# Patient Record
Sex: Male | Born: 1955 | Race: White | Hispanic: No | Marital: Married | Smoking: Never smoker
Health system: Southern US, Community
[De-identification: ages and names within clinical notes are randomized; demographics above are authoritative.]

## PROBLEM LIST (undated history)

## (undated) DIAGNOSIS — M5410 Radiculopathy, site unspecified: Secondary | ICD-10-CM

## (undated) DIAGNOSIS — G4730 Sleep apnea, unspecified: Secondary | ICD-10-CM

## (undated) DIAGNOSIS — M47816 Spondylosis without myelopathy or radiculopathy, lumbar region: Secondary | ICD-10-CM

## (undated) DIAGNOSIS — I10 Essential (primary) hypertension: Secondary | ICD-10-CM

## (undated) DIAGNOSIS — M48061 Spinal stenosis, lumbar region without neurogenic claudication: Secondary | ICD-10-CM

## (undated) DIAGNOSIS — R809 Proteinuria, unspecified: Secondary | ICD-10-CM

## (undated) DIAGNOSIS — T4145XA Adverse effect of unspecified anesthetic, initial encounter: Secondary | ICD-10-CM

## (undated) DIAGNOSIS — T8859XA Other complications of anesthesia, initial encounter: Secondary | ICD-10-CM

## (undated) DIAGNOSIS — E119 Type 2 diabetes mellitus without complications: Secondary | ICD-10-CM

## (undated) DIAGNOSIS — N189 Chronic kidney disease, unspecified: Secondary | ICD-10-CM

## (undated) HISTORY — DX: Proteinuria, unspecified: R80.9

## (undated) HISTORY — DX: Chronic kidney disease, unspecified: N18.9

## (undated) HISTORY — DX: Type 2 diabetes mellitus without complications: E11.9

## (undated) HISTORY — PX: EYE SURGERY: SHX253

## (undated) HISTORY — PX: BELPHAROPTOSIS REPAIR: SHX369

## (undated) HISTORY — PX: HEMORRHOID SURGERY: SHX153

## (undated) HISTORY — DX: Essential (primary) hypertension: I10

## (undated) HISTORY — PX: CARDIAC CATHETERIZATION: SHX172

## (undated) HISTORY — PX: OTHER SURGICAL HISTORY: SHX169

---

## 2003-09-16 ENCOUNTER — Encounter: Admission: RE | Admit: 2003-09-16 | Discharge: 2003-09-16 | Payer: Self-pay | Admitting: *Deleted

## 2003-10-21 ENCOUNTER — Encounter: Admission: RE | Admit: 2003-10-21 | Discharge: 2003-10-21 | Payer: Self-pay | Admitting: Orthopaedic Surgery

## 2003-11-04 ENCOUNTER — Encounter: Admission: RE | Admit: 2003-11-04 | Discharge: 2003-11-04 | Payer: Self-pay | Admitting: Orthopaedic Surgery

## 2003-11-19 ENCOUNTER — Encounter: Admission: RE | Admit: 2003-11-19 | Discharge: 2003-11-19 | Payer: Self-pay | Admitting: Orthopaedic Surgery

## 2007-03-09 ENCOUNTER — Ambulatory Visit: Payer: Self-pay | Admitting: Cardiology

## 2007-03-17 ENCOUNTER — Ambulatory Visit: Payer: Self-pay | Admitting: Cardiology

## 2007-03-17 ENCOUNTER — Inpatient Hospital Stay (HOSPITAL_BASED_OUTPATIENT_CLINIC_OR_DEPARTMENT_OTHER): Admission: RE | Admit: 2007-03-17 | Discharge: 2007-03-17 | Payer: Self-pay | Admitting: Cardiology

## 2007-03-28 ENCOUNTER — Ambulatory Visit: Payer: Self-pay | Admitting: Cardiology

## 2011-03-30 NOTE — Assessment & Plan Note (Signed)
[facility] OFFICE NOTE   MAKOA, SARRA                          MRN:          [id number]  DATE:03/28/2007                            DOB:          1956/10/13    HISTORY OF PRESENT ILLNESS:  The patient is a 55 year old male with a  history of multiple cardiac risk factors.  The patient reported  substernal chest pain and was referred for a cardiac catheterization.  The patient non-obstructive coronary artery disease.  He also has only  mildly elevated PA systolic pressures.  It was felt that the patient has  hypertensive heart disease as an etiology to his dyspnea.  Also as noted  in my previous consultation, the patient has a component of obesity  contributing to his dyspnea, as well as sleep apnea, although he has not  been formally diagnosed with this.  He states that he has no groin  complications.  He has no bleeding.  He is very relieved about the  results of his catheterization.   MEDICAL DECISION MAKING:  1. Glimepiride 4 mg, 1/2 tab p.o. b.i.d.  2. Hydrochlorothiazide 25 mg p.o. q.d.  3. Tri-Chlor 145 mg p.o. q.d.  4. Actos 30 mg p.o. q.d.  5. Aspirin 81 mg, two tab q.h.s.  6. Norvasc 10 mg p.o. q.d.  7. Lisinopril 10 mg p.o. q.d.   PHYSICAL EXAMINATION:  VITAL SIGNS:  Blood pressure 165/89, heart rate  89, weight 239 pounds.  NECK:  Normal carotid upstroke.  No carotid bruits.  LUNGS:  Clear breath sounds.  HEART:  A regular rate and rhythm.  Normal S1 and S2.  No murmurs, rubs  or gallops.  ABDOMEN:  Soft.  EXTREMITIES:  No clubbing, cyanosis or edema.  NEUROLOGIC:  The patient is alert, oriented and grossly nonfocal.   PROBLEMS:  1. Dyspnea, multifactorial      a.     Sleep apnea.      b.     Deconditioning.  2. Hypertensive heart disease.  3. Chronic back problems.  4. Diabetes mellitus.  5. Hypertension.   PLAN:  1. I have increased the patient's Lisinopril from 10 mg to 20 mg  p.o.      q.d.  2. The patient needs aggressive blood pressure control.  3. We have ordered an apnea link for the patient, due to my high      clinical suspicion of sleep apnea, which could be contributing to      his hypertension.  4. The patient also reports symptoms consistent with reflux.  I have      given him a prescription for Prilosec over-the-counter.     Ernestine Mcmurray, MD,FACC  Electronically Signed    GED/MedQ  DD: 03/28/2007  DT: 03/28/2007  Job #: [id number]   cc:   Particia Nearing, M.D.

## 2011-03-30 NOTE — Cardiovascular Report (Signed)
NAME:  [name], [name]                 ACCOUNT NO.:  [id number]   MEDICAL RECORD NO.:  [id number]          PATIENT TYPE:  OIB   LOCATION:  NA                           FACILITY:  [facility]   PHYSICIAN:  Bruce R. Olevia Perches, MD, FACCDATE OF BIRTH:  03-Mar-1956   DATE OF PROCEDURE:  03/17/2007  DATE OF DISCHARGE:                            CARDIAC CATHETERIZATION   CLINICAL HISTORY:  [name] is 55 years old and has no prior history  of known heart disease.  He was recently seen in consultation by Dr.  Dannielle Burn for evaluation of chest pain and shortness of breath with  exertion.  He also has hypertension, diabetes and a strong family  history for coronary heart disease.  He is on disability because of back  problems.  Because of high probability of disease, it was elected to  evaluate with coronary angiography.   PROCEDURE:  Left heart catheterization was performed percutaneously via  the right femoral artery using an arterial sheath and 4-French preformed  coronary catheters.  A front wall arterial puncture was performed and  Omnipaque contrast was used.  Right heart catheterization was performed  percutaneously via the right femoral vein using a mini sheath and Swan-  Ganz tunnelers and catheter.  The patient tolerated the procedure well  and left the laboratory in satisfactory condition.   RESULTS:  Left main coronary:  The left main coronary artery was free of  significant disease.   Left anterior descending artery:  Left anterior descending artery gave  rise to a diagonal branch, septal perforators and a small diagonal  branch.  There are irregularities in the LAD and there was 30% narrowing  in the mid LAD.   Circumflex artery:  The circumflex artery gave rise to an atrial branch,  a marginal branch and three posterolateral branches.  There was 50%  ostial stenosis in the marginal branch.   Right coronary:  The right coronary was a moderate-sized vessel that  gave rise to a right  ventricular branch, posterior descending branch and  two posterolateral branches.  This vessel is free of significant  disease.   Left ventriculogram:  Left ventriculogram performed in the RAO  projection showed good wall motion with no areas of hypokinesis.  Estimated ejection fraction was 60%.   Hemodynamic data:  The right atrial pressure was 11 mean.  The right  ventricular pressure was 36/11.  The pulmonary artery pressure was 36/17  with mean of 25.  Pulmonary wedge pressure was 19 mean.  Left  ventricular pressure was 141/22 and aortic pressure 141/80 with a mean  of 108.  Cardiac output/cardiac index was 6.8/3.0 meter squared by Fick.   CONCLUSION:  1. Minimal nonobstructive coronary artery disease with 30% mid left      anterior descending coronary artery, fixed and ostial stenosis in      the first large marginal branch of circumflex artery, no      significant obstruction of the right coronary, normal left      ventricular function.  2. Mild elevation of pulmonary artery wedge and pulmonary artery  pressure.   RECOMMENDATIONS:  There is no source of ischemia.  The patient may have  some mild diastolic dysfunction and does have hypertensive disease.  Probably optimal control of his hypertension may be the best therapeutic  option.  Will plan to have him follow up with Dr. Dannielle Burn in the next 2  weeks.      Bruce Alfonso Patten Olevia Perches, MD, [facility]  Electronically Signed     BRB/MEDQ  D:  03/17/2007  T:  03/17/2007  Job:  [id number]   cc:   Ernestine Mcmurray, MD,FACC  Dr. Arlys John

## 2011-04-02 NOTE — Assessment & Plan Note (Signed)
[place] OFFICE NOTE   [name]                          MRN:          [id number]  DATE:03/09/2007                            DOB:          1956/06/20    NEW PATIENT WORKUP:   REFERRING PHYSICIAN:  Particia Nearing and Dr. Melina Copa in [place].   REASON FOR CONSULTATION:  Evaluate of substernal chest pain in a 55-year-  old male with multiple cardiac risk factors.   HISTORY OF PRESENT ILLNESS:  [name] is a 55 year old male with  multiple cardiac risk factors, including hypertension and a strong  family history of coronary artery disease.  The patient is currently on  disability and does not do much in the way of exercise.  Over the last  several months, approximately 9-10 months, he reports chest pressure off  and on, particularly during activity.  He states that he also has  associated shortness of breath.  He has a component of deconditioning  and also gained a lot of weight, approximately 10 pounds in the last  four months.  The patient reports no orthopnea or PND but easily  fatigueability and daytime somnolence.  He also has morning headaches  and has symptoms consistent with sleep apnea.  His blood pressure is  uncontrolled and also reports recent edema of the lower extremities.   The patient's chest pain is a heavy substernal pressure in nature but  without radiation to the neck or the jaw or the arm.  There is no  nausea, vomiting, or diaphoresis.   EKG in the office demonstrates a normal sinus rhythm with nonspecific T  wave changes.   ALLERGIES:  No known drug allergies.   MEDICATIONS:  1. Glimepiride 4 mg 1/2 tablet p.o. b.i.d.  2. Hydrochlorothiazide 25 mg p.o. daily.  3. Tricor 145 mg p.o. daily.  4. Actos 30 mg p.o. daily.  5. Aspirin 81 mg 2 tablets nightly.  6. Norvasc 10 mg p.o. daily.   FAMILY HISTORY:  Notable for a father dying of a massive heart attack  at age  63.   SOCIAL HISTORY:  The patient is on disability.  He used to work as a  Dealer.  He denies any tobacco use.  He does drink 12 beers a day.   REVIEW OF SYSTEMS:  As per HPI.  Symptoms consistent with sleep apnea,  as outlined above, positive for chest pain and shortness of breath.  No  nausea or vomiting.  No palpitations or syncope.  No melena or  hematochezia.  No dysuria or frequency.  The remainder of the review of  systems is negative.   PHYSICAL EXAMINATION:  VITAL SIGNS:  Blood pressure 161/90 mmHg.  Heart  rate is 86 beats per minute.  GENERAL:  A well-nourished white male in no apparent distress but with a  somewhat lethargic appearance.  NECK:  Normal carotid upstrokes.  No carotid bruits.  LUNGS:  Clear breath sounds bilaterally.  HEART:  Regular rate and rhythm with normal S1 and S2.  No murmurs, rubs  or gallops.  ABDOMEN:  Soft and nontender.  No rebound or guarding.  Good bowel  sounds.  EXTREMITIES:  No clubbing, cyanosis or edema.  NEURO:  Patient is alert and oriented.  Grossly nonfocal.   PROBLEM LIST:  1. Substernal chest pain, rule out coronary artery disease.  2. Rule out obstructive sleep apnea.  3. Lower extremity edema.  4. Hypertension, poorly controlled.  Rule out hypertensive heart      disease.  5. Disability with chronic back disease.  6. Diabetes mellitus.  7. A strong family history of coronary artery disease.   PLAN:  1. The patient's symptoms of dyspnea and chest pain are very      concerning for coronary artery disease.  Given his high pre-test      probability, we will proceed with a cardiac catheterization.  I      have discussed the risks and benefits with the patient, and he is      willing to proceed.  2. The patient's blood pressure is poorly controlled, and I have      started the patient on lisinopril.  We will also check a repeat      BMET to follow his BUN and creatinine as well as a potassium.  3. Patient, after his  catheterization, will need further management      for high blood pressure control as well as a workup for sleep      apnea.  4. Will follow the patient after his cardiac catheterization.     Ernestine Mcmurray, MD,FACC     GED/MedQ  DD: 03/09/2007  DT: 03/09/2007  Job #: [id number]   cc:   Arlys John, [place]                          [facility] OFFICE NOTE   [name], [name]                          MRN:          [id number]  DATE:03/09/2007                            DOB:          12-01-1955    NEW PATIENT WORKUP:   REFERRING PHYSICIAN:  Particia Nearing and Dr. Melina Copa in [place].   REASON FOR CONSULTATION:  Evaluate of substernal chest pain in a 47-year-  old male with multiple cardiac risk factors.   HISTORY OF PRESENT ILLNESS:  [name] is a 55 year old male with  multiple cardiac risk factors, including hypertension and a strong  family history of coronary artery disease.  The patient is currently on  disability and does not do much in the way of exercise.  Over the last  several months, approximately 9-10 months, he reports chest pressure off  and on, particularly during activity.  He states that he also has  associated shortness of breath.  He has a component of deconditioning  and also gained a lot of weight, approximately 10 pounds in the last  four months.  The patient reports no orthopnea or PND but easily  fatigueability and daytime somnolence.  He also has morning headaches  and has symptoms consistent  with sleep apnea.  His blood pressure is  uncontrolled and also reports recent edema of the lower extremities.   The patient's chest pain is a heavy substernal pressure in nature but  without radiation to the neck or the jaw or the arm.  There is no  nausea, vomiting, or diaphoresis.   EKG in the office demonstrates a normal sinus rhythm with nonspecific T  wave changes.   ALLERGIES:  No known  drug allergies.   MEDICATIONS:  1. Glimepiride 4 mg 1/2 tablet p.o. b.i.d.  2. Hydrochlorothiazide 25 mg p.o. daily.  3. Tricor 145 mg p.o. daily.  4. Actos 30 mg p.o. daily.  5. Aspirin 81 mg 2 tablets nightly.  6. Norvasc 10 mg p.o. daily.   FAMILY HISTORY:  Notable for a father dying of a massive heart attack  at age 12.   SOCIAL HISTORY:  The patient is on disability.  He used to work as a  Dealer.  He denies any tobacco use.  He does drink 12 beers a day.   REVIEW OF SYSTEMS:  As per HPI.  Symptoms consistent with sleep apnea,  as outlined above, positive for chest pain and shortness of breath.  No  nausea or vomiting.  No palpitations or syncope.  No melena or  hematochezia.  No dysuria or frequency.  The remainder of the review of  systems is negative.   PHYSICAL EXAMINATION:  VITAL SIGNS:  Blood pressure 161/90 mmHg.  Heart  rate is 86 beats per minute.  GENERAL:  A well-nourished white male in no apparent distress but with a

## 2012-12-20 ENCOUNTER — Other Ambulatory Visit (HOSPITAL_COMMUNITY): Payer: Self-pay | Admitting: Nephrology

## 2012-12-20 DIAGNOSIS — N289 Disorder of kidney and ureter, unspecified: Secondary | ICD-10-CM

## 2013-01-15 ENCOUNTER — Ambulatory Visit (HOSPITAL_COMMUNITY)
Admission: RE | Admit: 2013-01-15 | Discharge: 2013-01-15 | Disposition: A | Discharge status: Home / Self Care | Payer: Medicare Other | Source: Ambulatory Visit | Attending: Nephrology | Admitting: Nephrology

## 2013-01-15 DIAGNOSIS — N289 Disorder of kidney and ureter, unspecified: Secondary | ICD-10-CM | POA: Insufficient documentation

## 2015-07-25 LAB — HM DIABETES EYE EXAM

## 2015-12-29 DIAGNOSIS — Z79899 Other long term (current) drug therapy: Secondary | ICD-10-CM | POA: Diagnosis not present

## 2015-12-29 DIAGNOSIS — I10 Essential (primary) hypertension: Secondary | ICD-10-CM | POA: Diagnosis not present

## 2015-12-29 DIAGNOSIS — R809 Proteinuria, unspecified: Secondary | ICD-10-CM | POA: Diagnosis not present

## 2015-12-29 DIAGNOSIS — N183 Chronic kidney disease, stage 3 (moderate): Secondary | ICD-10-CM | POA: Diagnosis not present

## 2015-12-29 DIAGNOSIS — D509 Iron deficiency anemia, unspecified: Secondary | ICD-10-CM | POA: Diagnosis not present

## 2015-12-29 DIAGNOSIS — E559 Vitamin D deficiency, unspecified: Secondary | ICD-10-CM | POA: Diagnosis not present

## 2015-12-31 DIAGNOSIS — N181 Chronic kidney disease, stage 1: Secondary | ICD-10-CM | POA: Diagnosis not present

## 2015-12-31 DIAGNOSIS — E669 Obesity, unspecified: Secondary | ICD-10-CM | POA: Diagnosis not present

## 2015-12-31 DIAGNOSIS — R809 Proteinuria, unspecified: Secondary | ICD-10-CM | POA: Diagnosis not present

## 2015-12-31 DIAGNOSIS — I10 Essential (primary) hypertension: Secondary | ICD-10-CM | POA: Diagnosis not present

## 2016-04-07 DIAGNOSIS — R809 Proteinuria, unspecified: Secondary | ICD-10-CM | POA: Diagnosis not present

## 2016-04-07 DIAGNOSIS — Z79899 Other long term (current) drug therapy: Secondary | ICD-10-CM | POA: Diagnosis not present

## 2016-04-07 DIAGNOSIS — I10 Essential (primary) hypertension: Secondary | ICD-10-CM | POA: Diagnosis not present

## 2016-04-07 DIAGNOSIS — N183 Chronic kidney disease, stage 3 (moderate): Secondary | ICD-10-CM | POA: Diagnosis not present

## 2016-04-07 DIAGNOSIS — E559 Vitamin D deficiency, unspecified: Secondary | ICD-10-CM | POA: Diagnosis not present

## 2016-04-07 DIAGNOSIS — D509 Iron deficiency anemia, unspecified: Secondary | ICD-10-CM | POA: Diagnosis not present

## 2016-04-14 DIAGNOSIS — I10 Essential (primary) hypertension: Secondary | ICD-10-CM | POA: Diagnosis not present

## 2016-04-14 DIAGNOSIS — G4730 Sleep apnea, unspecified: Secondary | ICD-10-CM | POA: Diagnosis not present

## 2016-04-14 DIAGNOSIS — R809 Proteinuria, unspecified: Secondary | ICD-10-CM | POA: Diagnosis not present

## 2016-04-14 DIAGNOSIS — E669 Obesity, unspecified: Secondary | ICD-10-CM | POA: Diagnosis not present

## 2016-04-14 DIAGNOSIS — N181 Chronic kidney disease, stage 1: Secondary | ICD-10-CM | POA: Diagnosis not present

## 2016-05-11 DIAGNOSIS — X32XXXA Exposure to sunlight, initial encounter: Secondary | ICD-10-CM | POA: Diagnosis not present

## 2016-05-11 DIAGNOSIS — L723 Sebaceous cyst: Secondary | ICD-10-CM | POA: Diagnosis not present

## 2016-05-11 DIAGNOSIS — L905 Scar conditions and fibrosis of skin: Secondary | ICD-10-CM | POA: Diagnosis not present

## 2016-05-11 DIAGNOSIS — L570 Actinic keratosis: Secondary | ICD-10-CM | POA: Diagnosis not present

## 2016-05-11 DIAGNOSIS — L720 Epidermal cyst: Secondary | ICD-10-CM | POA: Diagnosis not present

## 2016-05-11 DIAGNOSIS — D225 Melanocytic nevi of trunk: Secondary | ICD-10-CM | POA: Diagnosis not present

## 2016-05-19 DIAGNOSIS — L723 Sebaceous cyst: Secondary | ICD-10-CM | POA: Diagnosis not present

## 2016-07-13 DIAGNOSIS — L920 Granuloma annulare: Secondary | ICD-10-CM | POA: Diagnosis not present

## 2016-07-13 DIAGNOSIS — L723 Sebaceous cyst: Secondary | ICD-10-CM | POA: Diagnosis not present

## 2016-07-13 DIAGNOSIS — L720 Epidermal cyst: Secondary | ICD-10-CM | POA: Diagnosis not present

## 2016-07-16 ENCOUNTER — Telehealth: Payer: Self-pay | Admitting: Physician Assistant

## 2016-07-16 MED ORDER — CANAGLIFLOZIN 100 MG PO TABS: 100.0000 mg | ORAL_TABLET | Freq: Every day | ORAL | 0 refills | Status: DC

## 2016-07-16 NOTE — Telephone Encounter (Signed)
Samples given to patient  

## 2016-07-28 DIAGNOSIS — R809 Proteinuria, unspecified: Secondary | ICD-10-CM | POA: Diagnosis not present

## 2016-07-28 DIAGNOSIS — L920 Granuloma annulare: Secondary | ICD-10-CM | POA: Diagnosis not present

## 2016-07-28 DIAGNOSIS — N183 Chronic kidney disease, stage 3 (moderate): Secondary | ICD-10-CM | POA: Diagnosis not present

## 2016-07-28 DIAGNOSIS — D509 Iron deficiency anemia, unspecified: Secondary | ICD-10-CM | POA: Diagnosis not present

## 2016-07-28 DIAGNOSIS — I10 Essential (primary) hypertension: Secondary | ICD-10-CM | POA: Diagnosis not present

## 2016-07-28 DIAGNOSIS — Z79899 Other long term (current) drug therapy: Secondary | ICD-10-CM | POA: Diagnosis not present

## 2016-07-28 DIAGNOSIS — E559 Vitamin D deficiency, unspecified: Secondary | ICD-10-CM | POA: Diagnosis not present

## 2016-08-04 DIAGNOSIS — N189 Chronic kidney disease, unspecified: Secondary | ICD-10-CM | POA: Diagnosis not present

## 2016-08-04 DIAGNOSIS — E669 Obesity, unspecified: Secondary | ICD-10-CM | POA: Diagnosis not present

## 2016-08-04 DIAGNOSIS — R809 Proteinuria, unspecified: Secondary | ICD-10-CM | POA: Diagnosis not present

## 2016-08-05 ENCOUNTER — Telehealth: Payer: Self-pay | Admitting: Physician Assistant

## 2016-08-05 NOTE — Telephone Encounter (Signed)
Samples ready for pick up

## 2016-08-19 ENCOUNTER — Other Ambulatory Visit: Payer: Self-pay | Admitting: *Deleted

## 2016-08-20 MED ORDER — IRBESARTAN 300 MG PO TABS: 300.0000 mg | ORAL_TABLET | Freq: Every day | ORAL | 0 refills | Status: DC

## 2016-08-20 NOTE — Telephone Encounter (Signed)
Rx sent to pharmacy for Avapro

## 2016-08-24 ENCOUNTER — Telehealth: Payer: Self-pay

## 2016-08-30 ENCOUNTER — Telehealth: Payer: Self-pay | Admitting: Physician Assistant

## 2016-08-30 MED ORDER — CANAGLIFLOZIN 100 MG PO TABS: 100.0000 mg | ORAL_TABLET | Freq: Every day | ORAL | 0 refills | Status: DC

## 2016-08-30 NOTE — Telephone Encounter (Signed)
Samples given to patient  

## 2016-08-30 NOTE — Telephone Encounter (Signed)
Yes, okay to do samples if the medication list is provided

## 2016-08-30 NOTE — Telephone Encounter (Signed)
x

## 2016-08-30 NOTE — Telephone Encounter (Signed)
Is it ok to give samples not sure when he is due to be seen?

## 2016-09-30 ENCOUNTER — Other Ambulatory Visit: Payer: Self-pay | Admitting: Physician Assistant

## 2016-09-30 NOTE — Telephone Encounter (Signed)
Pt aware  - none available  

## 2016-10-20 DIAGNOSIS — I10 Essential (primary) hypertension: Secondary | ICD-10-CM | POA: Diagnosis not present

## 2016-10-20 DIAGNOSIS — N183 Chronic kidney disease, stage 3 (moderate): Secondary | ICD-10-CM | POA: Diagnosis not present

## 2016-10-20 DIAGNOSIS — R809 Proteinuria, unspecified: Secondary | ICD-10-CM | POA: Diagnosis not present

## 2016-10-20 DIAGNOSIS — E559 Vitamin D deficiency, unspecified: Secondary | ICD-10-CM | POA: Diagnosis not present

## 2016-10-20 DIAGNOSIS — Z79899 Other long term (current) drug therapy: Secondary | ICD-10-CM | POA: Diagnosis not present

## 2016-10-20 DIAGNOSIS — D509 Iron deficiency anemia, unspecified: Secondary | ICD-10-CM | POA: Diagnosis not present

## 2016-10-27 DIAGNOSIS — N250 Renal osteodystrophy: Secondary | ICD-10-CM | POA: Diagnosis not present

## 2016-10-27 DIAGNOSIS — N182 Chronic kidney disease, stage 2 (mild): Secondary | ICD-10-CM | POA: Diagnosis not present

## 2016-10-27 DIAGNOSIS — R809 Proteinuria, unspecified: Secondary | ICD-10-CM | POA: Diagnosis not present

## 2016-10-27 DIAGNOSIS — I10 Essential (primary) hypertension: Secondary | ICD-10-CM | POA: Diagnosis not present

## 2016-11-19 ENCOUNTER — Other Ambulatory Visit: Payer: Self-pay | Admitting: Physician Assistant

## 2016-11-19 NOTE — Telephone Encounter (Signed)
Pt hasn't been seen in our office yet.

## 2016-12-22 ENCOUNTER — Other Ambulatory Visit: Payer: Self-pay | Admitting: *Deleted

## 2016-12-22 DIAGNOSIS — G4733 Obstructive sleep apnea (adult) (pediatric): Secondary | ICD-10-CM

## 2016-12-28 ENCOUNTER — Other Ambulatory Visit: Payer: Self-pay | Admitting: Physician Assistant

## 2016-12-28 DIAGNOSIS — G4733 Obstructive sleep apnea (adult) (pediatric): Secondary | ICD-10-CM | POA: Diagnosis not present

## 2017-01-24 ENCOUNTER — Other Ambulatory Visit: Payer: Self-pay | Admitting: Physician Assistant

## 2017-01-24 NOTE — Telephone Encounter (Signed)
Patient has not been seen in our office yet.  Looks like he has gotten a refill from [place] and has been given some samples.  Please advise.

## 2017-02-08 ENCOUNTER — Other Ambulatory Visit: Payer: Self-pay | Admitting: Physician Assistant

## 2017-02-09 ENCOUNTER — Other Ambulatory Visit: Payer: Self-pay | Admitting: *Deleted

## 2017-02-09 MED ORDER — DAPAGLIFLOZIN PROPANEDIOL 10 MG PO TABS: 10.0000 mg | ORAL_TABLET | Freq: Every day | ORAL | 0 refills | Status: DC

## 2017-02-09 MED ORDER — CANAGLIFLOZIN 100 MG PO TABS: 100.0000 mg | ORAL_TABLET | Freq: Every day | ORAL | 0 refills | Status: DC

## 2017-02-09 NOTE — Telephone Encounter (Signed)
TC to [facility]. Invokana was not refilled d/t pt not being seen yet not that his insurance would not pay for it. Instructed pharmacy not to change medication and to refill med till his appt on 4/10.  TC to patient to check with his insurance to make sure that Particia Nearing is listed as his PCP and not Dr. Melina Copa before his appointment

## 2017-02-16 ENCOUNTER — Ambulatory Visit: Payer: Self-pay

## 2017-02-16 DIAGNOSIS — R05 Cough: Secondary | ICD-10-CM | POA: Diagnosis not present

## 2017-02-16 DIAGNOSIS — J029 Acute pharyngitis, unspecified: Secondary | ICD-10-CM | POA: Diagnosis not present

## 2017-02-16 DIAGNOSIS — B370 Candidal stomatitis: Secondary | ICD-10-CM | POA: Diagnosis not present

## 2017-02-23 ENCOUNTER — Encounter: Payer: Self-pay | Admitting: Physician Assistant

## 2017-02-23 ENCOUNTER — Ambulatory Visit (INDEPENDENT_AMBULATORY_CARE_PROVIDER_SITE_OTHER): Payer: PPO | Admitting: Physician Assistant

## 2017-02-23 VITALS — BP 144/71 | HR 88 | Temp 98.7°F | Ht 72.0 in | Wt 217.6 lb

## 2017-02-23 DIAGNOSIS — N401 Enlarged prostate with lower urinary tract symptoms: Secondary | ICD-10-CM

## 2017-02-23 DIAGNOSIS — I120 Hypertensive chronic kidney disease with stage 5 chronic kidney disease or end stage renal disease: Secondary | ICD-10-CM | POA: Insufficient documentation

## 2017-02-23 DIAGNOSIS — Z79899 Other long term (current) drug therapy: Secondary | ICD-10-CM | POA: Diagnosis not present

## 2017-02-23 DIAGNOSIS — E1022 Type 1 diabetes mellitus with diabetic chronic kidney disease: Secondary | ICD-10-CM | POA: Diagnosis not present

## 2017-02-23 DIAGNOSIS — N182 Chronic kidney disease, stage 2 (mild): Secondary | ICD-10-CM

## 2017-02-23 DIAGNOSIS — E785 Hyperlipidemia, unspecified: Secondary | ICD-10-CM | POA: Diagnosis not present

## 2017-02-23 DIAGNOSIS — E1169 Type 2 diabetes mellitus with other specified complication: Secondary | ICD-10-CM | POA: Diagnosis not present

## 2017-02-23 DIAGNOSIS — E1159 Type 2 diabetes mellitus with other circulatory complications: Secondary | ICD-10-CM | POA: Insufficient documentation

## 2017-02-23 DIAGNOSIS — N138 Other obstructive and reflux uropathy: Secondary | ICD-10-CM | POA: Insufficient documentation

## 2017-02-23 DIAGNOSIS — R809 Proteinuria, unspecified: Secondary | ICD-10-CM | POA: Diagnosis not present

## 2017-02-23 DIAGNOSIS — I152 Hypertension secondary to endocrine disorders: Secondary | ICD-10-CM | POA: Insufficient documentation

## 2017-02-23 DIAGNOSIS — E1122 Type 2 diabetes mellitus with diabetic chronic kidney disease: Secondary | ICD-10-CM

## 2017-02-23 DIAGNOSIS — N185 Chronic kidney disease, stage 5: Secondary | ICD-10-CM

## 2017-02-23 DIAGNOSIS — E559 Vitamin D deficiency, unspecified: Secondary | ICD-10-CM | POA: Diagnosis not present

## 2017-02-23 DIAGNOSIS — I10 Essential (primary) hypertension: Secondary | ICD-10-CM | POA: Insufficient documentation

## 2017-02-23 DIAGNOSIS — R3914 Feeling of incomplete bladder emptying: Secondary | ICD-10-CM | POA: Diagnosis not present

## 2017-02-23 DIAGNOSIS — D509 Iron deficiency anemia, unspecified: Secondary | ICD-10-CM | POA: Diagnosis not present

## 2017-02-23 DIAGNOSIS — N183 Chronic kidney disease, stage 3 (moderate): Secondary | ICD-10-CM | POA: Diagnosis not present

## 2017-02-23 HISTORY — DX: Essential (primary) hypertension: I10

## 2017-02-23 LAB — BAYER DCA HB A1C WAIVED: HB A1C (BAYER DCA - WAIVED): 8.7 % — ABNORMAL HIGH (ref <7.0)

## 2017-02-23 MED ORDER — CANAGLIFLOZIN 100 MG PO TABS: 100.0000 mg | ORAL_TABLET | Freq: Every day | ORAL | 5 refills | Status: DC

## 2017-02-23 MED ORDER — AMLODIPINE BESYLATE 10 MG PO TABS: 10.0000 mg | ORAL_TABLET | Freq: Every day | ORAL | 5 refills | Status: DC

## 2017-02-23 MED ORDER — FINASTERIDE 5 MG PO TABS: 5.0000 mg | ORAL_TABLET | Freq: Every day | ORAL | 11 refills | Status: DC

## 2017-02-23 MED ORDER — FUROSEMIDE 20 MG PO TABS: 20.0000 mg | ORAL_TABLET | Freq: Two times a day (BID) | ORAL | 5 refills | Status: DC

## 2017-02-23 MED ORDER — IRBESARTAN 300 MG PO TABS: 300.0000 mg | ORAL_TABLET | Freq: Every day | ORAL | 5 refills | Status: DC

## 2017-02-23 MED ORDER — MUPIROCIN CALCIUM 2 % EX CREA: 1.0000 "application " | TOPICAL_CREAM | Freq: Two times a day (BID) | CUTANEOUS | 6 refills | Status: DC

## 2017-02-23 MED ORDER — CLONIDINE HCL 0.2 MG PO TABS: 0.2000 mg | ORAL_TABLET | Freq: Three times a day (TID) | ORAL | 5 refills | Status: DC

## 2017-02-23 MED ORDER — POTASSIUM CHLORIDE CRYS ER 20 MEQ PO TBCR: 20.0000 meq | EXTENDED_RELEASE_TABLET | Freq: Two times a day (BID) | ORAL | 5 refills | Status: DC

## 2017-02-23 NOTE — Patient Instructions (Signed)
Carbohydrate Counting for Diabetes Mellitus, Adult Carbohydrate counting is a method for keeping track of how many carbohydrates you eat. Eating carbohydrates naturally increases the amount of sugar (glucose) in the blood. Counting how many carbohydrates you eat helps keep your blood glucose within normal limits, which helps you manage your diabetes (diabetes mellitus). It is important to know how many carbohydrates you can safely have in each meal. This is different for every person. A diet and nutrition specialist (registered dietitian) can help you make a meal plan and calculate how many carbohydrates you should have at each meal and snack. Carbohydrates are found in the following foods:  Grains, such as breads and cereals.  Dried beans and soy products.  Starchy vegetables, such as potatoes, peas, and corn.  Fruit and fruit juices.  Milk and yogurt.  Sweets and snack foods, such as cake, cookies, candy, chips, and soft drinks. How do I count carbohydrates? There are two ways to count carbohydrates in food. You can use either of the methods or a combination of both. Reading "Nutrition Facts" on packaged food  The "Nutrition Facts" list is included on the labels of almost all packaged foods and beverages in the [place] It includes:  The serving size.  Information about nutrients in each serving, including the grams (g) of carbohydrate per serving. To use the "Nutrition Facts":  Decide how many servings you will have.  Multiply the number of servings by the number of carbohydrates per serving.  The resulting number is the total amount of carbohydrates that you will be having. Learning standard serving sizes of other foods  When you eat foods containing carbohydrates that are not packaged or do not include "Nutrition Facts" on the label, you need to measure the servings in order to count the amount of carbohydrates:  Measure the foods that you will eat with a food scale or measuring  cup, if needed.  Decide how many standard-size servings you will eat.  Multiply the number of servings by 15. Most carbohydrate-rich foods have about 15 g of carbohydrates per serving.  For example, if you eat 8 oz (170 g) of strawberries, you will have eaten 2 servings and 30 g of carbohydrates (2 servings x 15 g = 30 g).  For foods that have more than one food mixed, such as soups and casseroles, you must count the carbohydrates in each food that is included. The following list contains standard serving sizes of common carbohydrate-rich foods. Each of these servings has about 15 g of carbohydrates:   hamburger bun or  English muffin.   oz (15 mL) syrup.   oz (14 g) jelly.  1 slice of bread.  1 six-inch tortilla.  3 oz (85 g) cooked rice or pasta.  4 oz (113 g) cooked dried beans.  4 oz (113 g) starchy vegetable, such as peas, corn, or potatoes.  4 oz (113 g) hot cereal.  4 oz (113 g) mashed potatoes or  of a large baked potato.  4 oz (113 g) canned or frozen fruit.  4 oz (120 mL) fruit juice.  4-6 crackers.  6 chicken nuggets.  6 oz (170 g) unsweetened dry cereal.  6 oz (170 g) plain fat-free yogurt or yogurt sweetened with artificial sweeteners.  8 oz (240 mL) milk.  8 oz (170 g) fresh fruit or one small piece of fruit.  24 oz (680 g) popped popcorn. Example of carbohydrate counting Sample meal  3 oz (85 g) chicken breast.  6 oz (  170 g) brown rice.  4 oz (113 g) corn.  8 oz (240 mL) milk.  8 oz (170 g) strawberries with sugar-free whipped topping. Carbohydrate calculation 1. Identify the foods that contain carbohydrates:  Rice.  Corn.  Milk.  Strawberries. 2. Calculate how many servings you have of each food:  2 servings rice.  1 serving corn.  1 serving milk.  1 serving strawberries. 3. Multiply each number of servings by 15 g:  2 servings rice x 15 g = 30 g.  1 serving corn x 15 g = 15 g.  1 serving milk x 15 g = 15  g.  1 serving strawberries x 15 g = 15 g. 4. Add together all of the amounts to find the total grams of carbohydrates eaten:  30 g + 15 g + 15 g + 15 g = 75 g of carbohydrates total. This information is not intended to replace advice given to you by your health care provider. Make sure you discuss any questions you have with your health care provider. Document Released: 11/01/2005 Document Revised: 05/21/2016 Document Reviewed: 04/14/2016 Elsevier Interactive Patient Education  2017 [employer]  

## 2017-02-23 NOTE — Progress Notes (Addendum)
BP (!) 144/71   Pulse 88   Temp 98.7 F (37.1 C) (Oral)   Ht 6' (1.829 m)   Wt 217 lb 9.6 oz (98.7 kg)   BMI 29.51 kg/m    Subjective:    Patient ID: [name], male    DOB: May 20, 1956, 61 y.o.   MRN: [id number]  HPI: [name] is a 61 y.o. 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 The patient is seen by nephrology in [place]. He will be going next week. We will draw the labs today along with our labs. He reports that his rash is still being quite aggravating times. Biopsy did not show any type of cancer but found to be more of a infectious nature. He does not recall using any topical antibiotic cream like Bactroban. He has started using antibacterial soap on a regular basis. He feels that it has calmed down somewhat. His morning sugars are running in the low 200s most of the time. He does not check himself at any other time of day. He has mild nocturia and frequency through the day.   All medications are reviewed today. There are no reports of any problems with the medications. All of the medical conditions are reviewed and updated.  Lab work is reviewed and will be ordered as medically necessary. There are no new problems reported with today's visit.   Relevant past medical, surgical, family and social history reviewed and updated as indicated. Allergies and medications reviewed and updated.  Past Medical History:  Diagnosis Date  . Diabetes mellitus without complication ([facility])   . Hypertension     Past Surgical History:  Procedure Laterality Date  . HEMORRHOID SURGERY      Review of Systems  Constitutional: Positive for fatigue. Negative for appetite change.  HENT: Negative.   Eyes: Negative.   Negative for pain and visual disturbance.  Respiratory: Negative.  Negative for cough, chest tightness, shortness of breath and wheezing.   Cardiovascular: Negative.  Negative for chest pain, palpitations and leg swelling.  Gastrointestinal: Negative.  Negative for abdominal pain, diarrhea, nausea and vomiting.  Endocrine: Negative.   Genitourinary: Negative.   Musculoskeletal: Negative.   Skin: Positive for color change, rash and wound.  Neurological: Negative.  Negative for weakness, numbness and headaches.  Psychiatric/Behavioral: Negative.     Allergies as of 02/23/2017   No Known Allergies     Medication List       Accurate as of 02/23/17 10:35 AM. Always use your most recent med list.          amLODipine 10 MG tablet Commonly known as:  NORVASC Take 1 tablet (10 mg total) by mouth daily.   canagliflozin 100 MG Tabs tablet Commonly known as:  INVOKANA Take 1 tablet (100 mg total) by mouth daily before breakfast.   cloNIDine 0.2 MG tablet Commonly known as:  CATAPRES Take 1 tablet (0.2 mg total) by mouth 3 (three) times daily.   finasteride 5 MG tablet Commonly known as:  PROSCAR Take 1 tablet (5 mg total) by mouth daily.   furosemide 20 MG tablet Commonly known as:  LASIX Take 1 tablet (20 mg total) by mouth 2 (two) times daily.   irbesartan 300 MG tablet Commonly known as:  AVAPRO Take 1  tablet (300 mg total) by mouth daily.   mupirocin cream 2 % Commonly known as:  BACTROBAN Apply 1 application topically 2 (two) times daily.   potassium chloride SA 20 MEQ tablet Commonly known as:  K-DUR,KLOR-CON Take 1 tablet (20 mEq total) by mouth 2 (two) times daily.   Vitamin D-3 5000 units Tabs Take by mouth.          Objective:    BP (!) 144/71   Pulse 88   Temp 98.7 F (37.1 C) (Oral)   Ht 6' (1.829 m)   Wt 217 lb 9.6 oz (98.7 kg)   BMI 29.51 kg/m   No Known Allergies  Physical Exam  Constitutional: He appears well-developed and well-nourished.  No distress.  HENT:  Head: Normocephalic and atraumatic.  Eyes: Conjunctivae and EOM are normal. Pupils are equal, round, and reactive to light.  Cardiovascular: Normal rate, regular rhythm and normal heart sounds.   Pulmonary/Chest: Effort normal and breath sounds normal. No respiratory distress.  Skin: Skin is warm and dry. Lesion and rash noted. Rash is macular. There is erythema.     Psychiatric: He has a normal mood and affect. His behavior is normal.  Nursing note and vitals reviewed.       Assessment & Plan:   1. Essential hypertension - CMP14+EGFR - CBC with Differential/Platelet - Microalbumin / creatinine urine ratio - irbesartan (AVAPRO) 300 MG tablet; Take 1 tablet (300 mg total) by mouth daily.  Dispense: 30 tablet; Refill: 5 - amLODipine (NORVASC) 10 MG tablet; Take 1 tablet (10 mg total) by mouth daily.  Dispense: 30 tablet; Refill: 5 - cloNIDine (CATAPRES) 0.2 MG tablet; Take 1 tablet (0.2 mg total) by mouth 3 (three) times daily.  Dispense: 90 tablet; Refill: 5 - potassium chloride SA (K-DUR,KLOR-CON) 20 MEQ tablet; Take 1 tablet (20 mEq total) by mouth 2 (two) times daily.  Dispense: 60 tablet; Refill: 5 - furosemide (LASIX) 20 MG tablet; Take 1 tablet (20 mg total) by mouth 2 (two) times daily.  Dispense: 30 tablet; Refill: 5  2. Benign prostatic hyperplasia with incomplete bladder emptying - PSA - finasteride (PROSCAR) 5 MG tablet; Take 1 tablet (5 mg total) by mouth daily.  Dispense: 30 tablet; Refill: 11  3. Vitamin D deficiency  4. 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  Dispense: 30 tablet; Refill: 5  5. Hyperlipidemia associated with type 2 diabetes mellitus (HCC) - Lipid panel  6. Type 2 diabetes mellitus with stage 2 chronic kidney disease, without long-term current use of insulin (HCC) - Bayer DCA Hb  A1c Waived    Current Outpatient Prescriptions:  .  amLODipine (NORVASC) 10 MG tablet, Take 1 tablet (10 mg total) by mouth daily., Disp: 30 tablet, Rfl: 5 .  canagliflozin (INVOKANA) 100 MG TABS tablet, Take 1 tablet (100 mg total) by mouth daily before breakfast., Disp: 30 tablet, Rfl: 5 .  Cholecalciferol (VITAMIN D-3) 5000 units TABS, Take by mouth., Disp: , Rfl:  .  cloNIDine (CATAPRES) 0.2 MG tablet, Take 1 tablet (0.2 mg total) by mouth 3 (three) times daily., Disp: 90 tablet, Rfl: 5 .  finasteride (PROSCAR) 5 MG tablet, Take 1 tablet (5 mg total) by mouth daily., Disp: 30 tablet, Rfl: 11 .  furosemide (LASIX) 20 MG tablet, Take 1 tablet (20 mg total) by mouth 2 (two) times daily., Disp:  30 tablet, Rfl: 5 .  irbesartan (AVAPRO) 300 MG tablet, Take 1 tablet (300 mg total) by mouth daily., Disp: 30 tablet, Rfl: 5 .  potassium chloride SA (K-DUR,KLOR-CON) 20 MEQ tablet, Take 1 tablet (20 mEq total) by mouth 2 (two) times daily., Disp: 60 tablet, Rfl: 5 .  mupirocin cream (BACTROBAN) 2 %, Apply 1 application topically 2 (two) times daily., Disp: 30 g, Rfl: 6  Continue all other maintenance medications as listed above.  Follow up plan: Return in about 3 months (around 05/25/2017) for recheck.  Educational handout given for carb counting  Terald Sleeper PA-C [facility] [address]  [place], [place] [address] [phone]   02/23/2017, 10:35 AM

## 2017-02-24 LAB — CBC WITH DIFFERENTIAL/PLATELET
Basophils Absolute: 0 10*3/uL (ref 0.0–0.2)
Basos: 1 %
EOS (ABSOLUTE): 0.2 10*3/uL (ref 0.0–0.4)
Eos: 3 %
Hematocrit: 43.7 % (ref 37.5–51.0)
Hemoglobin: 15.6 g/dL (ref 13.0–17.7)
Immature Grans (Abs): 0.1 10*3/uL (ref 0.0–0.1)
Immature Granulocytes: 1 %
Lymphocytes Absolute: 2.4 10*3/uL (ref 0.7–3.1)
Lymphs: 29 %
MCH: 32.3 pg (ref 26.6–33.0)
MCHC: 35.7 g/dL (ref 31.5–35.7)
MCV: 91 fL (ref 79–97)
Monocytes Absolute: 0.6 10*3/uL (ref 0.1–0.9)
Monocytes: 7 %
Neutrophils Absolute: 4.9 10*3/uL (ref 1.4–7.0)
Neutrophils: 59 %
Platelets: 168 10*3/uL (ref 150–379)
RBC: 4.83 x10E6/uL (ref 4.14–5.80)
RDW: 13.7 % (ref 12.3–15.4)
WBC: 8.2 10*3/uL (ref 3.4–10.8)

## 2017-02-24 LAB — CMP14+EGFR
ALT: 25 IU/L (ref 0–44)
AST: 23 IU/L (ref 0–40)
Albumin/Globulin Ratio: 1.7 (ref 1.2–2.2)
Albumin: 4.2 g/dL (ref 3.6–4.8)
Alkaline Phosphatase: 85 IU/L (ref 39–117)
BUN/Creatinine Ratio: 15 (ref 10–24)
BUN: 13 mg/dL (ref 8–27)
Bilirubin Total: 1 mg/dL (ref 0.0–1.2)
CO2: 25 mmol/L (ref 18–29)
Calcium: 9.5 mg/dL (ref 8.6–10.2)
Chloride: 97 mmol/L (ref 96–106)
Creatinine, Ser: 0.86 mg/dL (ref 0.76–1.27)
GFR calc Af Amer: 107 mL/min/{1.73_m2} (ref >59)
GFR calc non Af Amer: 92 mL/min/{1.73_m2} (ref >59)
Globulin, Total: 2.5 g/dL (ref 1.5–4.5)
Glucose: 264 mg/dL — ABNORMAL HIGH (ref 65–99)
Potassium: 3.8 mmol/L (ref 3.5–5.2)
Sodium: 142 mmol/L (ref 134–144)
Total Protein: 6.7 g/dL (ref 6.0–8.5)

## 2017-02-24 LAB — LIPID PANEL
Chol/HDL Ratio: 3.6 ratio (ref 0.0–5.0)
Cholesterol, Total: 171 mg/dL (ref 100–199)
HDL: 48 mg/dL (ref >39)
LDL Calculated: 83 mg/dL (ref 0–99)
Triglycerides: 202 mg/dL — ABNORMAL HIGH (ref 0–149)
VLDL Cholesterol Cal: 40 mg/dL (ref 5–40)

## 2017-02-24 LAB — MICROALBUMIN / CREATININE URINE RATIO
Creatinine, Urine: 24.6 mg/dL
Microalb/Creat Ratio: 1822.4 mg/g creat — ABNORMAL HIGH (ref 0.0–30.0)
Microalbumin, Urine: 448.3 ug/mL

## 2017-02-24 LAB — PSA: Prostate Specific Ag, Serum: 0.8 ng/mL (ref 0.0–4.0)

## 2017-03-02 DIAGNOSIS — I10 Essential (primary) hypertension: Secondary | ICD-10-CM | POA: Diagnosis not present

## 2017-03-02 DIAGNOSIS — N182 Chronic kidney disease, stage 2 (mild): Secondary | ICD-10-CM | POA: Diagnosis not present

## 2017-03-02 DIAGNOSIS — E669 Obesity, unspecified: Secondary | ICD-10-CM | POA: Diagnosis not present

## 2017-03-02 DIAGNOSIS — R809 Proteinuria, unspecified: Secondary | ICD-10-CM | POA: Diagnosis not present

## 2017-03-14 ENCOUNTER — Ambulatory Visit (INDEPENDENT_AMBULATORY_CARE_PROVIDER_SITE_OTHER): Payer: PPO | Admitting: Pharmacist

## 2017-03-14 ENCOUNTER — Encounter: Payer: Self-pay | Admitting: Pharmacist

## 2017-03-14 VITALS — BP 140/84 | HR 70 | Ht 72.0 in | Wt 221.0 lb

## 2017-03-14 DIAGNOSIS — I10 Essential (primary) hypertension: Secondary | ICD-10-CM

## 2017-03-14 DIAGNOSIS — E1165 Type 2 diabetes mellitus with hyperglycemia: Secondary | ICD-10-CM | POA: Diagnosis not present

## 2017-03-14 MED ORDER — FUROSEMIDE 20 MG PO TABS
20.0000 mg | ORAL_TABLET | Freq: Two times a day (BID) | ORAL | 1 refills | Status: DC
Start: 2017-03-14 — End: 2017-05-25

## 2017-03-14 MED ORDER — METFORMIN HCL ER 500 MG PO TB24: 500.0000 mg | ORAL_TABLET | Freq: Every day | ORAL | 0 refills | Status: DC

## 2017-03-14 NOTE — Progress Notes (Signed)
Patient ID: [name], male   DOB: 28-Sep-1956, 61 y.o.   MRN: [id number]   Subjective:    [name] is a 61 y.o. male who presents for an initial evaluation of Type 2 diabetes mellitus.  Current symptoms/problems include hyperglycemia and polyuria (which occurs mostly at night) and have been unchanged. Symptoms have been present for 3 months.  Current diabetic medications include invokana 100mg  qd.   The patient was initially diagnosed with Type 2 diabetes mellitus over 10 years ago.  Current monitoring regimen: home blood tests - once a day Home blood sugar records: fasting range: 177 - 250 Any episodes of hypoglycemia? no  Known diabetic complications: nephropathy Cardiovascular risk factors: advanced age (older than 4 for men, 78 for women), diabetes mellitus, family history of premature cardiovascular disease, hypertension, male gender, obesity (BMI >= 30 kg/m2) and macroalbuminuria Eye exam current (within one year): last exam wa about 1 year ago - [facility] in [place], [place] Weight trend: stable Prior visit with CDE: no Current diet: in general, an "unhealthy" diet Current exercise: none Medication Compliance?  Yes   Is He on ACE inhibitor or angiotensin II receptor blocker?  Yes    irbesartan (Avapro)  The following portions of the patient's history were reviewed and updated as appropriate: allergies, current medications, past family history, past medical history, past social history, past surgical history and problem list.    Objective:    BP 140/84   Pulse 70   Ht 6' (1.829 m)   Wt 221 lb (100.2 kg)   BMI 29.97 kg/m    Last A1c = 8.7% (02/23/2017)  Lab Review Glucose (mg/dL)  Date Value  02/23/2017 264 (H)   CO2 (mmol/L)  Date Value  02/23/2017 25   BUN (mg/dL)  Date Value  02/23/2017 13   Creatinine, Ser (mg/dL)  Date Value  02/23/2017 0.86      Assessment:    Diabetes Mellitus type II, under inadequate control.   Nocturia - might be related  to uncontrolled DM or to dosing of furosemide   Plan:    1.  Rx changes: add metformin XR 500mg  qd (using low dose due to ETOH intake).     Continue Invokana 100mg  qd  Change furosemide to 20mg  1 qam and 1 noon (do not take later than 4pm)  Recommended B complex vitamin 2.  Education: Reviewed 'ABCs' of diabetes management (respective goals in parentheses):  A1C (<7), blood pressure (<130/80), and cholesterol (LDL <100). 3. Discussed pathophysiology of DM; difference between type 1 and type 2 DM. 4. CHO counting diet discussed.  Reviewed CHO amount in various foods and how to read nutrition labels.  Discussed recommended serving sizes.  5.  Recommend check BG 1-2 times a day. Recommended he check both fasting and post prandially 6.  Recommended increase physical activity - goal is 150 minutes per week 7. Follow up: 1 month

## 2017-03-14 NOTE — Patient Instructions (Signed)
Diabetes and Standards of Medical Care   Diabetes is complicated. You may find that your diabetes team includes a dietitian, nurse, diabetes educator, eye doctor, and more. To help everyone know what is going on and to help you get the care you deserve, the following schedule of care was developed to help keep you on track. Below are the tests, exams, vaccines, medicines, education, and plans you will need.  Blood Glucose Goals Prior to meals = 80 - 130 Within 2 hours of the start of a meal = less than 180  HbA1c test (goal is less than 7.0% - your last value was 8.7%) This test shows how well you have controlled your glucose over the past 2 to 3 months. It is used to see if your diabetes management plan needs to be adjusted.   It is performed at least 2 times a year if you are meeting treatment goals.  It is performed 4 times a year if therapy has changed or if you are not meeting treatment goals.  Blood pressure test  This test is performed at every routine medical visit. The goal is less than 140/90 mmHg for most people, but 130/80 mmHg in some cases. Ask your health care provider about your goal.  Dental exam  Follow up with the dentist regularly.  Eye exam  If you are diagnosed with type 1 diabetes as a child, get an exam upon reaching the age of 10 years or older and have had diabetes for 3 to 5 years. Yearly eye exams are recommended after that initial eye exam.  If you are diagnosed with type 1 diabetes as an adult, get an exam within 5 years of diagnosis and then yearly.  If you are diagnosed with type 2 diabetes, get an exam as soon as possible after the diagnosis and then yearly.  Foot care exam  Visual foot exams are performed at every routine medical visit. The exams check for cuts, injuries, or other problems with the feet.  A comprehensive foot exam should be done yearly. This includes visual inspection as well as assessing foot pulses and testing for loss of  sensation.  Check your feet nightly for cuts, injuries, or other problems with your feet. Tell your health care provider if anything is not healing.  Kidney function test (urine microalbumin)  This test is performed once a year.  Type 1 diabetes: The first test is performed 5 years after diagnosis.  Type 2 diabetes: The first test is performed at the time of diagnosis.  A serum creatinine and estimated glomerular filtration rate (eGFR) test is done once a year to assess the level of chronic kidney disease (CKD), if present.  Lipid profile (cholesterol, HDL, LDL, triglycerides)  Performed every 5 years for most people.  The goal for LDL is less than 100 mg/dL. If you are at high risk, the goal is less than 70 mg/dL.  The goal for HDL is 40 mg/dL to 50 mg/dL for men and 50 mg/dL to 60 mg/dL for women. An HDL cholesterol of 60 mg/dL or higher gives some protection against heart disease.  The goal for triglycerides is less than 150 mg/dL.  Influenza vaccine, pneumococcal vaccine, and hepatitis B vaccine  The influenza vaccine is recommended yearly.  The pneumococcal vaccine is generally given once in a lifetime. However, there are some instances when another vaccination is recommended. Check with your health care provider.  The hepatitis B vaccine is also recommended for adults with diabetes.    Diabetes self-management education  Education is recommended at diagnosis and ongoing as needed.  Treatment plan  Your treatment plan is reviewed at every medical visit.  Document Released: 08/29/2009 Document Revised: 07/04/2013 Document Reviewed: 04/03/2013 Exit[employer] Patient Information 2014 [employer].   

## 2017-04-14 ENCOUNTER — Encounter: Payer: Self-pay | Admitting: Pharmacist

## 2017-04-14 ENCOUNTER — Ambulatory Visit (INDEPENDENT_AMBULATORY_CARE_PROVIDER_SITE_OTHER): Payer: PPO | Admitting: Pharmacist

## 2017-04-14 VITALS — BP 142/80 | HR 77 | Ht 72.0 in | Wt 220.0 lb

## 2017-04-14 DIAGNOSIS — Z23 Encounter for immunization: Secondary | ICD-10-CM

## 2017-04-14 DIAGNOSIS — Z0000 Encounter for general adult medical examination without abnormal findings: Secondary | ICD-10-CM | POA: Diagnosis not present

## 2017-04-14 MED ORDER — EMPAGLIFLOZIN-LINAGLIPTIN 10-5 MG PO TABS: 1.0000 | ORAL_TABLET | ORAL | 0 refills | Status: DC

## 2017-04-14 NOTE — Progress Notes (Addendum)
Patient ID: [name], male   DOB: [birth year], 61 y.o.   MRN: [id number]     Subjective:   [name] is a 61 y.o. male who presents for an Initial Medicare Annual Wellness Visit and to recheck T2DM.   At visit last month started metformin XR 500mg  qd to Invokana 100mg  qd. [name] reports that he does not feel that BG has improved much since start metformin XR.  BG is still 200 to 300.   Clifford. [name] also mentions that he feels that he has been "flying off the handle" more lately.  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  Marital history: married for 90 years Has 3 adult children - 2 sons and 1 daughter Alcohol/Tobacco/Substances:  + ETOH use; no tobacco use    Current Medications (verified) Outpatient Encounter Prescriptions as of 04/14/2017  Medication Sig  . amLODipine (NORVASC) 10 MG tablet Take 1 tablet (10 mg total) by mouth daily.  . cloNIDine (CATAPRES) 0.2 MG tablet Take 1 tablet (0.2 mg total) by mouth 3 (three) times daily. (Patient taking differently: Take 0.2 mg by mouth 2 (two) times daily. )  . Cyanocobalamin (VITAMIN B 12 PO) Take by mouth.  . finasteride (PROSCAR) 5 MG tablet Take 1 tablet (5 mg total) by mouth daily.  . furosemide (LASIX) 20 MG tablet Take 1 tablet (20 mg total) by mouth 2 (two) times daily. Take second dose not later than 4pm  . irbesartan (AVAPRO) 300 MG tablet Take 1 tablet (300 mg total) by mouth daily.  . metFORMIN (GLUCOPHAGE-XR) 500 MG 24 hr tablet Take 1 tablet (500 mg total) by mouth daily with breakfast.  . mupirocin cream (BACTROBAN) 2 % Apply 1 application topically 2 (two) times daily.  . potassium chloride SA (K-DUR,KLOR-CON) 20 MEQ tablet Take 1 tablet (20 mEq total) by mouth 2 (two) times daily.  . Vitamin D, Cholecalciferol, 1000 units TABS Take 2 tablets by mouth daily.  . [DISCONTINUED] canagliflozin  (INVOKANA) 100 MG TABS tablet Take 1 tablet (100 mg total) by mouth daily before breakfast.  . Empagliflozin-Linagliptin (GLYXAMBI) 10-5 MG TABS Take 1 tablet by mouth every morning.   No facility-administered encounter medications on file as of 04/14/2017.     Allergies (verified) Patient has no known allergies.   History: Past Medical History:  Diagnosis Date  . Chronic kidney disease   . Diabetes mellitus without complication ([facility])   . Hypertension   . Proteinuria    Past Surgical History:  Procedure Laterality Date  . BELPHAROPTOSIS REPAIR    . CARDIAC CATHETERIZATION    . EYE SURGERY    . HEMORRHOID SURGERY     Family History  Problem Relation Age of Onset  . Heart attack Father   . Hypertension Father   . Vision loss Sister        one eye  . Cancer Brother        skin  . Diabetes Brother    Social History   Occupational History  . Not on file.   Social History Main Topics  . Smoking status: Never Smoker  . Smokeless tobacco: Never Used  . Alcohol use 4.8 oz/week    8 Cans of beer per week  . Drug use: No  . Sexual activity: Yes    Do you feel safe at home?  Yes Are there smokers  in your home (other than you)? No  Dietary issues and exercise activities discussed: Current Exercise Habits: The patient does not participate in regular exercise at present;The patient has a physically strenous job, but has no regular exercise apart from work. (works Mining engineer and tobacco farm)  Current Dietary habits:  Has been limiting CHO intake.  Still has some slips.  Eating only 1 meal most days - usually evening meal. Sometimes crackers at lunch.   Cardiac Risk Factors include: advanced age (>61men, >55 women);diabetes mellitus;family history of premature cardiovascular disease;male gender;hypertension;sedentary lifestyle  Objective:    Today's Vitals   04/14/17 1111  BP: (!) 142/80  Pulse: 77  Weight: 220 lb (99.8 kg)  Height: 6' (1.829 m)  PainSc: 0-No pain    Body mass index is 29.84 kg/m.   Activities of Daily Living In your present state of health, do you have any difficulty performing the following activities: 04/14/2017  Hearing? N  Vision? N  Difficulty concentrating or making decisions? N  Walking or climbing stairs? N  Dressing or bathing? N  Doing errands, shopping? N  Preparing Food and eating ? N  Using the Toilet? N  In the past six months, have you accidently leaked urine? N  Do you have problems with loss of bowel control? N  Managing your Medications? N  Managing your Finances? N  Housekeeping or managing your Housekeeping? N  Some recent data might be hidden     Depression Screen PHQ 2/9 Scores 04/14/2017 02/23/2017 02/23/2017  PHQ - 2 Score 1 0 0     Fall Risk Fall Risk  04/14/2017  Falls in the past year? No    Cognitive Function: MMSE - Mini Mental State Exam 04/14/2017  Not completed: Unable to complete  Orientation to time 5  Orientation to Place 5  Registration 3  Attention/ Calculation 0  Recall 3  Language- name 2 objects 2  Language- repeat 1  Language- follow 3 step command 3  Language- read & follow direction 0  Write a sentence 0  Copy design 0  Total score 22    Immunizations and Health Maintenance Immunization History  Administered Date(s) Administered  . Pneumococcal Polysaccharide-23 04/14/2017   Health Maintenance Due  Topic Date Due  . Hepatitis C Screening  1956-03-06  . PNEUMOCOCCAL POLYSACCHARIDE VACCINE (1) 01/12/1956  . FOOT EXAM  01/12/1964  . HIV Screening  01/11/1969  . TETANUS/TDAP  01/11/1973  . COLONOSCOPY  01/12/2004  . OPHTHALMOLOGY EXAM  07/24/2016    Patient Care Team: Theodoro Clock as PCP - General (Physician Assistant) Allyn Kenner, MD as Consulting Physician (Dermatology) Liana Gerold, MD as Consulting Physician (Nephrology)  Indicate any recent Medical Services you may have received from other than [facility] providers in the past year (date may  be approximate).    Assessment:    Annual Wellness Visit  Type 2 DM - uncontrolled   Screening Tests Health Maintenance  Topic Date Due  . Hepatitis C Screening  1956/04/21  . PNEUMOCOCCAL POLYSACCHARIDE VACCINE (1) 01/12/1956  . FOOT EXAM  01/12/1964  . HIV Screening  01/11/1969  . TETANUS/TDAP  01/11/1973  . COLONOSCOPY  01/12/2004  . OPHTHALMOLOGY EXAM  07/24/2016  . INFLUENZA VACCINE  06/15/2017  . HEMOGLOBIN A1C  08/25/2017        Plan:   During the course of the visit Marden Noble was educated and counseled about the following appropriate screening and preventive services:   Vaccines to include Pneumoccal, Influenza,  Td, and Shingles - patient received pneumovax 23 today.  Will consider getting Shingrix and Tdap later  Colorectal cancer screening  Cardiovascular disease screening  Diabetes - switch Invokana to Glyxambi 5/10mg  - take 1 tablet daily (combo DPP4/SGLT2).  Contineu metformin XR 500mg  qd  Glaucoma screening / Diabetic Eye Exam - needed  Nutrition counseling  Advanced Directives - information provided  Patient declined referral for counseling.   Declined appt with PCP for mood.  He did feel that talking about issues today was helpful.   Physical Activity  Follow up with PCP in 6 weeks and with me in 6 months.  Patient Instructions (the written plan) were given to the patient.   Cherre Robins, PharmD   04/14/2017     I have reviewed and agree with the above AWV documentation.   Terald Sleeper PA-C [facility] [address]  [place], [place] [address] [phone]

## 2017-04-14 NOTE — Patient Instructions (Addendum)
  [name] , Thank you for taking time to come for your Medicare Wellness Visit. I appreciate your ongoing commitment to your health goals. Please review the following plan we discussed and let me know if I can assist you in the future.   These are the goals we discussed:  Try to eat 3 meals a day or at least small snack for breakfast and lunch.   Recommend exercise 150 minutes per week  Yearly diabetic eye exam due - call my eye doctor to make appointment - [phone].   This is a list of the screening recommended for you and due dates:  Health Maintenance  Topic Date Due  .  Hepatitis C: One time screening is recommended by Center for Disease Control  (CDC) for  adults born from [birth year] through 1967.   08-Mar-1956  . Pneumococcal vaccine (1) 01/12/1956  . Complete foot exam   01/12/1964  . HIV Screening  01/11/1969  . Tetanus Vaccine  01/11/1973  . Eye exam for diabetics  07/24/2016  . Flu Shot  06/15/2017  . Hemoglobin A1C  08/25/2017

## 2017-05-25 ENCOUNTER — Encounter: Payer: Self-pay | Admitting: Physician Assistant

## 2017-05-25 ENCOUNTER — Ambulatory Visit (INDEPENDENT_AMBULATORY_CARE_PROVIDER_SITE_OTHER): Payer: PPO | Admitting: Physician Assistant

## 2017-05-25 VITALS — BP 142/75 | HR 85 | Temp 98.7°F | Ht 72.0 in | Wt 221.0 lb

## 2017-05-25 DIAGNOSIS — I10 Essential (primary) hypertension: Secondary | ICD-10-CM | POA: Diagnosis not present

## 2017-05-25 DIAGNOSIS — N182 Chronic kidney disease, stage 2 (mild): Secondary | ICD-10-CM | POA: Diagnosis not present

## 2017-05-25 DIAGNOSIS — E1122 Type 2 diabetes mellitus with diabetic chronic kidney disease: Secondary | ICD-10-CM

## 2017-05-25 DIAGNOSIS — R3914 Feeling of incomplete bladder emptying: Secondary | ICD-10-CM

## 2017-05-25 DIAGNOSIS — N401 Enlarged prostate with lower urinary tract symptoms: Secondary | ICD-10-CM

## 2017-05-25 DIAGNOSIS — E1022 Type 1 diabetes mellitus with diabetic chronic kidney disease: Secondary | ICD-10-CM | POA: Diagnosis not present

## 2017-05-25 LAB — BAYER DCA HB A1C WAIVED: HB A1C: 7.9 % — AB (ref <7.0)

## 2017-05-25 MED ORDER — CLONIDINE HCL 0.2 MG PO TABS: 0.2000 mg | ORAL_TABLET | Freq: Two times a day (BID) | ORAL | 3 refills | Status: DC

## 2017-05-25 MED ORDER — METFORMIN HCL ER 500 MG PO TB24: 500.0000 mg | ORAL_TABLET | Freq: Every day | ORAL | 0 refills | Status: DC

## 2017-05-25 MED ORDER — CANAGLIFLOZIN 100 MG PO TABS: 300.0000 mg | ORAL_TABLET | Freq: Every day | ORAL | 5 refills | Status: DC

## 2017-05-25 MED ORDER — IRBESARTAN 300 MG PO TABS: 300.0000 mg | ORAL_TABLET | Freq: Every day | ORAL | 11 refills | Status: DC

## 2017-05-25 MED ORDER — FUROSEMIDE 20 MG PO TABS: 20.0000 mg | ORAL_TABLET | Freq: Two times a day (BID) | ORAL | 3 refills | Status: DC

## 2017-05-25 NOTE — Progress Notes (Signed)
BP (!) 142/75   Pulse 85   Temp 98.7 F (37.1 C) (Oral)   Ht 6' (1.829 m)   Wt 221 lb (100.2 kg)   BMI 29.97 kg/m    Subjective:    Patient ID: [name], male    DOB: [birth year], 61 y.o.   MRN: [id number]  HPI: [name] is a 61 y.o. male presenting on 05/25/2017 for Follow-up (3 month); Diabetes; Hyperlipidemia; and Hypertension  This patient comes in for periodic recheck on medications and conditions including Hypertension, diabetes type II uncontrolled. The patient started [facility]. He was given samples. When he went to the pharmacy to get the medication it was over $80 he was unable to afford it. I did let him and his wife know that this medicine had 2 medications in it. It had a medicine similar to the [facility] which he is taking. He did go back on the Invokana 100 mg. He does have kidney disease and therefore has to stay at a lower dose. We will have blood drawn today to see what his A1c is like. There are no other complaints at this time.   All medications are reviewed today. There are no reports of any problems with the medications. All of the medical conditions are reviewed and updated.  Lab work is reviewed and will be ordered as medically necessary. There are no new problems reported with today's visit.   Relevant past medical, surgical, family and social history reviewed and updated as indicated. Allergies and medications reviewed and updated.  Past Medical History:  Diagnosis Date  . Chronic kidney disease   . Diabetes mellitus without complication ([place])   . Hypertension   . Proteinuria     Past Surgical History:  Procedure Laterality Date  . BELPHAROPTOSIS REPAIR    . CARDIAC CATHETERIZATION    . EYE SURGERY    . HEMORRHOID SURGERY      Review of Systems  Constitutional: Negative.  Negative for appetite change and fatigue.  HENT: Negative.   Eyes: Negative.  Negative for pain and visual disturbance.  Respiratory: Negative.  Negative for cough, chest  tightness, shortness of breath and wheezing.   Cardiovascular: Negative.  Negative for chest pain, palpitations and leg swelling.  Gastrointestinal: Negative.  Negative for abdominal pain, diarrhea, nausea and vomiting.  Endocrine: Negative.   Genitourinary: Negative.   Musculoskeletal: Negative.   Skin: Negative.  Negative for color change and rash.  Neurological: Negative.  Negative for weakness, numbness and headaches.  Psychiatric/Behavioral: Negative.     Allergies as of 05/25/2017   No Known Allergies     Medication List       Accurate as of 05/25/17 12:06 PM. Always use your most recent med list.          amLODipine 10 MG tablet Commonly known as:  NORVASC Take 1 tablet (10 mg total) by mouth daily.   canagliflozin 100 MG Tabs tablet Commonly known as:  INVOKANA Take 3 tablets (300 mg total) by mouth daily before breakfast.   cloNIDine 0.2 MG tablet Commonly known as:  CATAPRES Take 1 tablet (0.2 mg total) by mouth 2 (two) times daily.   finasteride 5 MG tablet Commonly known as:  PROSCAR Take 1 tablet (5 mg total) by mouth daily.   furosemide 20 MG tablet Commonly known as:  LASIX Take 1 tablet (20 mg total) by mouth 2 (two) times daily. Take second dose not later than 4pm   irbesartan 300 MG tablet Commonly known  as:  AVAPRO Take 1 tablet (300 mg total) by mouth daily.   metFORMIN 500 MG 24 hr tablet Commonly known as:  GLUCOPHAGE-XR Take 1 tablet (500 mg total) by mouth daily with breakfast.   mupirocin cream 2 % Commonly known as:  BACTROBAN Apply 1 application topically 2 (two) times daily.   potassium chloride SA 20 MEQ tablet Commonly known as:  K-DUR,KLOR-CON Take 1 tablet (20 mEq total) by mouth 2 (two) times daily.   VITAMIN B 12 PO Take by mouth.   Vitamin D (Cholecalciferol) 1000 units Tabs Take 2 tablets by mouth daily.          Objective:    BP (!) 142/75   Pulse 85   Temp 98.7 F (37.1 C) (Oral)   Ht 6' (1.829 m)   Wt 221  lb (100.2 kg)   BMI 29.97 kg/m   No Known Allergies  Physical Exam  Constitutional: He appears well-developed and well-nourished. No distress.  HENT:  Head: Normocephalic and atraumatic.  Eyes: Conjunctivae and EOM are normal. Pupils are equal, round, and reactive to light.  Cardiovascular: Normal rate, regular rhythm and normal heart sounds.   Pulmonary/Chest: Effort normal and breath sounds normal. No respiratory distress.  Skin: Skin is warm and dry.  Psychiatric: He has a normal mood and affect. His behavior is normal.  Nursing note and vitals reviewed.   Results for orders placed or performed in visit on 05/05/17  HM DIABETES EYE EXAM  Result Value Ref Range   HM Diabetic Eye Exam Retinopathy (A) No Retinopathy      Assessment & Plan:   1. Essential hypertension - irbesartan (AVAPRO) 300 MG tablet; Take 1 tablet (300 mg total) by mouth daily.  Dispense: 30 tablet; Refill: 11 - furosemide (LASIX) 20 MG tablet; Take 1 tablet (20 mg total) by mouth 2 (two) times daily. Take second dose not later than 4pm  Dispense: 180 tablet; Refill: 3 - cloNIDine (CATAPRES) 0.2 MG tablet; Take 1 tablet (0.2 mg total) by mouth 2 (two) times daily.  Dispense: 180 tablet; Refill: 3 - CBC with Differential/Platelet - CMP14+EGFR - Lipid panel - Bayer DCA Hb A1c Waived  2. Benign prostatic hyperplasia with incomplete bladder emptying  3. 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  Dispense: 30 tablet; Refill: 5 - metFORMIN (GLUCOPHAGE-XR) 500 MG 24 hr tablet; Take 1 tablet (500 mg total) by mouth daily with breakfast.  Dispense: 90 tablet; Refill: 0 - CBC with Differential/Platelet - CMP14+EGFR - Lipid panel - Bayer DCA Hb A1c Waived   Current Outpatient Prescriptions:  .  amLODipine (NORVASC) 10 MG tablet, Take 1 tablet (10 mg total) by mouth daily., Disp:  30 tablet, Rfl: 5 .  cloNIDine (CATAPRES) 0.2 MG tablet, Take 1 tablet (0.2 mg total) by mouth 2 (two) times daily., Disp: 180 tablet, Rfl: 3 .  Cyanocobalamin (VITAMIN B 12 PO), Take by mouth., Disp: , Rfl:  .  finasteride (PROSCAR) 5 MG tablet, Take 1 tablet (5 mg total) by mouth daily., Disp: 30 tablet, Rfl: 11 .  furosemide (LASIX) 20 MG tablet, Take 1 tablet (20 mg total) by mouth 2 (two) times daily. Take second dose not later than 4pm, Disp: 180 tablet, Rfl: 3 .  irbesartan (AVAPRO) 300 MG tablet, Take 1 tablet (300 mg total) by mouth daily., Disp: 30 tablet, Rfl: 11 .  metFORMIN (GLUCOPHAGE-XR) 500 MG 24 hr tablet, Take 1 tablet (500 mg total) by mouth daily with breakfast., Disp: 90 tablet, Rfl: 0 .  mupirocin cream (BACTROBAN) 2 %, Apply 1 application topically 2 (two) times daily., Disp: 30 g, Rfl: 6 .  potassium chloride SA (K-DUR,KLOR-CON) 20 MEQ tablet, Take 1 tablet (20 mEq total) by mouth 2 (two) times daily., Disp: 60 tablet, Rfl: 5 .  Vitamin D, Cholecalciferol, 1000 units TABS, Take 2 tablets by mouth daily., Disp: , Rfl:  .  canagliflozin (INVOKANA) 100 MG TABS tablet, Take 3 tablets (300 mg total) by mouth daily before breakfast., Disp: 30 tablet, Rfl: 5  Continue all other maintenance medications as listed above.  Follow up plan: Return in about 3 months (around 08/25/2017) for recheck.  Educational handout given for [place] PA-C [place] [address]  [place], [place] [address] [phone]   05/25/2017, 12:06 PM

## 2017-05-25 NOTE — Patient Instructions (Signed)
In a few days you may receive a survey in the mail or online from [employer] regarding your visit with us today. Please take a moment to fill this out. Your feedback is very important to our whole office. It can help us better understand your needs as well as improve your experience and satisfaction. Thank you for taking your time to complete it. We care about you.  Tristy Udovich, PA-C  

## 2017-05-26 LAB — CMP14+EGFR
A/G RATIO: 1.8 (ref 1.2–2.2)
ALT: 25 IU/L (ref 0–44)
AST: 25 IU/L (ref 0–40)
Albumin: 4.2 g/dL (ref 3.6–4.8)
Alkaline Phosphatase: 71 IU/L (ref 39–117)
BUN/Creatinine Ratio: 14 (ref 10–24)
BUN: 13 mg/dL (ref 8–27)
Bilirubin Total: 0.9 mg/dL (ref 0.0–1.2)
CALCIUM: 9.3 mg/dL (ref 8.6–10.2)
CO2: 25 mmol/L (ref 20–29)
Chloride: 100 mmol/L (ref 96–106)
Creatinine, Ser: 0.95 mg/dL (ref 0.76–1.27)
GFR calc Af Amer: 98 mL/min/{1.73_m2} (ref >59)
GFR, EST NON AFRICAN AMERICAN: 85 mL/min/{1.73_m2} (ref >59)
GLOBULIN, TOTAL: 2.4 g/dL (ref 1.5–4.5)
Glucose: 209 mg/dL — ABNORMAL HIGH (ref 65–99)
POTASSIUM: 3.7 mmol/L (ref 3.5–5.2)
SODIUM: 141 mmol/L (ref 134–144)
Total Protein: 6.6 g/dL (ref 6.0–8.5)

## 2017-05-26 LAB — CBC WITH DIFFERENTIAL/PLATELET
BASOS: 1 %
Basophils Absolute: 0.1 10*3/uL (ref 0.0–0.2)
EOS (ABSOLUTE): 0.2 10*3/uL (ref 0.0–0.4)
Eos: 2 %
Hematocrit: 42.8 % (ref 37.5–51.0)
Hemoglobin: 15.2 g/dL (ref 13.0–17.7)
IMMATURE GRANULOCYTES: 1 %
Immature Grans (Abs): 0 10*3/uL (ref 0.0–0.1)
Lymphocytes Absolute: 2 10*3/uL (ref 0.7–3.1)
Lymphs: 26 %
MCH: 32.8 pg (ref 26.6–33.0)
MCHC: 35.5 g/dL (ref 31.5–35.7)
MCV: 92 fL (ref 79–97)
MONOS ABS: 0.6 10*3/uL (ref 0.1–0.9)
Monocytes: 9 %
NEUTROS ABS: 4.7 10*3/uL (ref 1.4–7.0)
NEUTROS PCT: 61 %
PLATELETS: 185 10*3/uL (ref 150–379)
RBC: 4.63 x10E6/uL (ref 4.14–5.80)
RDW: 13.8 % (ref 12.3–15.4)
WBC: 7.5 10*3/uL (ref 3.4–10.8)

## 2017-05-26 LAB — LIPID PANEL
CHOL/HDL RATIO: 2.9 ratio (ref 0.0–5.0)
Cholesterol, Total: 154 mg/dL (ref 100–199)
HDL: 53 mg/dL (ref >39)
LDL Calculated: 80 mg/dL (ref 0–99)
TRIGLYCERIDES: 107 mg/dL (ref 0–149)
VLDL Cholesterol Cal: 21 mg/dL (ref 5–40)

## 2017-06-28 DIAGNOSIS — I10 Essential (primary) hypertension: Secondary | ICD-10-CM | POA: Diagnosis not present

## 2017-06-28 DIAGNOSIS — Z79899 Other long term (current) drug therapy: Secondary | ICD-10-CM | POA: Diagnosis not present

## 2017-06-28 DIAGNOSIS — R809 Proteinuria, unspecified: Secondary | ICD-10-CM | POA: Diagnosis not present

## 2017-06-28 DIAGNOSIS — E559 Vitamin D deficiency, unspecified: Secondary | ICD-10-CM | POA: Diagnosis not present

## 2017-06-28 DIAGNOSIS — N183 Chronic kidney disease, stage 3 (moderate): Secondary | ICD-10-CM | POA: Diagnosis not present

## 2017-06-28 DIAGNOSIS — D509 Iron deficiency anemia, unspecified: Secondary | ICD-10-CM | POA: Diagnosis not present

## 2017-06-29 ENCOUNTER — Other Ambulatory Visit: Payer: PPO

## 2017-07-06 ENCOUNTER — Encounter: Payer: Self-pay | Admitting: *Deleted

## 2017-07-06 DIAGNOSIS — E669 Obesity, unspecified: Secondary | ICD-10-CM | POA: Diagnosis not present

## 2017-07-06 DIAGNOSIS — R809 Proteinuria, unspecified: Secondary | ICD-10-CM | POA: Diagnosis not present

## 2017-07-06 DIAGNOSIS — N182 Chronic kidney disease, stage 2 (mild): Secondary | ICD-10-CM | POA: Diagnosis not present

## 2017-07-06 DIAGNOSIS — I10 Essential (primary) hypertension: Secondary | ICD-10-CM | POA: Diagnosis not present

## 2017-07-19 ENCOUNTER — Ambulatory Visit: Payer: Self-pay | Admitting: Pharmacist

## 2017-08-25 ENCOUNTER — Encounter: Payer: Self-pay | Admitting: Physician Assistant

## 2017-08-25 ENCOUNTER — Ambulatory Visit (INDEPENDENT_AMBULATORY_CARE_PROVIDER_SITE_OTHER): Payer: PPO | Admitting: Physician Assistant

## 2017-08-25 VITALS — BP 158/86 | HR 90 | Temp 98.8°F | Ht 71.0 in | Wt 223.0 lb

## 2017-08-25 DIAGNOSIS — R3914 Feeling of incomplete bladder emptying: Secondary | ICD-10-CM

## 2017-08-25 DIAGNOSIS — N401 Enlarged prostate with lower urinary tract symptoms: Secondary | ICD-10-CM | POA: Diagnosis not present

## 2017-08-25 DIAGNOSIS — I10 Essential (primary) hypertension: Secondary | ICD-10-CM

## 2017-08-25 DIAGNOSIS — Z23 Encounter for immunization: Secondary | ICD-10-CM

## 2017-08-25 DIAGNOSIS — E1169 Type 2 diabetes mellitus with other specified complication: Secondary | ICD-10-CM | POA: Diagnosis not present

## 2017-08-25 DIAGNOSIS — E785 Hyperlipidemia, unspecified: Secondary | ICD-10-CM | POA: Diagnosis not present

## 2017-08-25 LAB — BAYER DCA HB A1C WAIVED: HB A1C (BAYER DCA - WAIVED): 8.9 % — ABNORMAL HIGH (ref <7.0)

## 2017-08-25 MED ORDER — TAMSULOSIN HCL 0.4 MG PO CAPS: 0.4000 mg | ORAL_CAPSULE | Freq: Every day | ORAL | 6 refills | Status: DC

## 2017-08-25 NOTE — Progress Notes (Signed)
BP (!) 158/86   Pulse 90   Temp 98.8 F (37.1 C) (Oral)   Ht '5\' 11"'$  (1.803 m)   Wt 223 lb (101.2 kg)   BMI 31.10 kg/m    Subjective:    Patient ID: [name], male    DOB: [birth year], 61 y.o.   MRN: [id number]  HPI: [name] is a 61 y.o. male presenting on 08/25/2017 for Hypertension (3 mo check up); Hypothyroidism; and Diabetes  This patient comes in for periodic recheck on medications and conditions including Hypertension, hyperlipidemia, diabetes, nocturia 3. He has known BPH. He is not taking Flomax at this time. He has seen a urologist in the past. This is interrupting his sleep and he does feel quite tired through the day. He has multifactorial reasons for being very tired. We'll have labs drawn today..   All medications are reviewed today. There are no reports of any problems with the medications. All of the medical conditions are reviewed and updated.  Lab work is reviewed and will be ordered as medically necessary. There are no new problems reported with today's visit.   Relevant past medical, surgical, family and social history reviewed and updated as indicated. Allergies and medications reviewed and updated.  Past Medical History:  Diagnosis Date  . Chronic kidney disease   . Diabetes mellitus without complication ([place])   . Hypertension   . Proteinuria     Past Surgical History:  Procedure Laterality Date  . BELPHAROPTOSIS REPAIR    . CARDIAC CATHETERIZATION    . EYE SURGERY    . HEMORRHOID SURGERY      Review of Systems  Constitutional: Negative.  Negative for appetite change and fatigue.  HENT: Negative.   Eyes: Negative.  Negative for pain and visual disturbance.  Respiratory: Negative.  Negative for cough, chest tightness, shortness of breath and wheezing.   Cardiovascular: Negative.  Negative for chest pain, palpitations and leg swelling.  Gastrointestinal: Negative.  Negative for abdominal pain, diarrhea, nausea and vomiting.  Endocrine:  Negative.   Genitourinary: Negative.   Musculoskeletal: Negative.   Skin: Negative.  Negative for color change and rash.  Neurological: Negative.  Negative for weakness, numbness and headaches.  Psychiatric/Behavioral: Negative.     Allergies as of 08/25/2017   No Known Allergies     Medication List       Accurate as of 08/25/17  2:02 PM. Always use your most recent med list.          amLODipine 10 MG tablet Commonly known as:  NORVASC Take 1 tablet (10 mg total) by mouth daily.   canagliflozin 100 MG Tabs tablet Commonly known as:  INVOKANA Take 3 tablets (300 mg total) by mouth daily before breakfast.   cloNIDine 0.2 MG tablet Commonly known as:  CATAPRES Take 1 tablet (0.2 mg total) by mouth 2 (two) times daily.   finasteride 5 MG tablet Commonly known as:  PROSCAR Take 1 tablet (5 mg total) by mouth daily.   furosemide 20 MG tablet Commonly known as:  LASIX Take 1 tablet (20 mg total) by mouth 2 (two) times daily. Take second dose not later than 4pm   irbesartan 300 MG tablet Commonly known as:  AVAPRO Take 1 tablet (300 mg total) by mouth daily.   metFORMIN 500 MG 24 hr tablet Commonly known as:  GLUCOPHAGE-XR Take 1 tablet (500 mg total) by mouth daily with breakfast.   mupirocin cream 2 % Commonly known as:  BACTROBAN Apply 1 application  topically 2 (two) times daily.   potassium chloride SA 20 MEQ tablet Commonly known as:  K-DUR,KLOR-CON Take 1 tablet (20 mEq total) by mouth 2 (two) times daily.   tamsulosin 0.4 MG Caps capsule Commonly known as:  FLOMAX Take 1 capsule (0.4 mg total) by mouth daily.   VITAMIN B 12 PO Take by mouth.   Vitamin D (Cholecalciferol) 1000 units Tabs Take 2 tablets by mouth daily.          Objective:    BP (!) 158/86   Pulse 90   Temp 98.8 F (37.1 C) (Oral)   Ht '5\' 11"'$  (1.803 m)   Wt 223 lb (101.2 kg)   BMI 31.10 kg/m   No Known Allergies  Physical Exam  Constitutional: He appears well-developed  and well-nourished. No distress.  HENT:  Head: Normocephalic and atraumatic.  Eyes: Pupils are equal, round, and reactive to light. Conjunctivae and EOM are normal.  Cardiovascular: Normal rate, regular rhythm and normal heart sounds.   Pulmonary/Chest: Effort normal and breath sounds normal. No respiratory distress.  Skin: Skin is warm and dry.  Psychiatric: He has a normal mood and affect. His behavior is normal.  Nursing note and vitals reviewed.   Results for orders placed or performed in visit on 05/25/17  CBC with Differential/Platelet  Result Value Ref Range   WBC 7.5 3.4 - 10.8 x10E3/uL   RBC 4.63 4.14 - 5.80 x10E6/uL   Hemoglobin 15.2 13.0 - 17.7 g/dL   Hematocrit 42.8 37.5 - 51.0 %   MCV 92 79 - 97 fL   MCH 32.8 26.6 - 33.0 pg   MCHC 35.5 31.5 - 35.7 g/dL   RDW 13.8 12.3 - 15.4 %   Platelets 185 150 - 379 x10E3/uL   Neutrophils 61 Not Estab. %   Lymphs 26 Not Estab. %   Monocytes 9 Not Estab. %   Eos 2 Not Estab. %   Basos 1 Not Estab. %   Neutrophils Absolute 4.7 1.4 - 7.0 x10E3/uL   Lymphocytes Absolute 2.0 0.7 - 3.1 x10E3/uL   Monocytes Absolute 0.6 0.1 - 0.9 x10E3/uL   EOS (ABSOLUTE) 0.2 0.0 - 0.4 x10E3/uL   Basophils Absolute 0.1 0.0 - 0.2 x10E3/uL   Immature Granulocytes 1 Not Estab. %   Immature Grans (Abs) 0.0 0.0 - 0.1 x10E3/uL  CMP14+EGFR  Result Value Ref Range   Glucose 209 (H) 65 - 99 mg/dL   BUN 13 8 - 27 mg/dL   Creatinine, Ser 0.95 0.76 - 1.27 mg/dL   GFR calc non Af Amer 85 >59 mL/min/1.73   GFR calc Af Amer 98 >59 mL/min/1.73   BUN/Creatinine Ratio 14 10 - 24   Sodium 141 134 - 144 mmol/L   Potassium 3.7 3.5 - 5.2 mmol/L   Chloride 100 96 - 106 mmol/L   CO2 25 20 - 29 mmol/L   Calcium 9.3 8.6 - 10.2 mg/dL   Total Protein 6.6 6.0 - 8.5 g/dL   Albumin 4.2 3.6 - 4.8 g/dL   Globulin, Total 2.4 1.5 - 4.5 g/dL   Albumin/Globulin Ratio 1.8 1.2 - 2.2   Bilirubin Total 0.9 0.0 - 1.2 mg/dL   Alkaline Phosphatase 71 39 - 117 IU/L   AST 25 0 - 40  IU/L   ALT 25 0 - 44 IU/L  Lipid panel  Result Value Ref Range   Cholesterol, Total 154 100 - 199 mg/dL   Triglycerides 107 0 - 149 mg/dL   HDL 53 >39 mg/dL  VLDL Cholesterol Cal 21 5 - 40 mg/dL   LDL Calculated 80 0 - 99 mg/dL   Chol/HDL Ratio 2.9 0.0 - 5.0 ratio  Bayer DCA Hb A1c Waived  Result Value Ref Range   Bayer DCA Hb A1c Waived 7.9 (H) <7.0 %      Assessment & Plan:   1. Benign prostatic hyperplasia with incomplete bladder emptying - PSA - tamsulosin (FLOMAX) 0.4 MG CAPS capsule; Take 1 capsule (0.4 mg total) by mouth daily.  Dispense: 30 capsule; Refill: 6 - CMP14+EGFR - Bayer DCA Hb A1c Waived  2. Essential hypertension - CMP14+EGFR  3. Hyperlipidemia associated with type 2 diabetes mellitus (HCC) - CMP14+EGFR - Bayer DCA Hb A1c Waived    Current Outpatient Prescriptions:  .  amLODipine (NORVASC) 10 MG tablet, Take 1 tablet (10 mg total) by mouth daily., Disp: 30 tablet, Rfl: 5 .  canagliflozin (INVOKANA) 100 MG TABS tablet, Take 3 tablets (300 mg total) by mouth daily before breakfast., Disp: 30 tablet, Rfl: 5 .  cloNIDine (CATAPRES) 0.2 MG tablet, Take 1 tablet (0.2 mg total) by mouth 2 (two) times daily., Disp: 180 tablet, Rfl: 3 .  Cyanocobalamin (VITAMIN B 12 PO), Take by mouth., Disp: , Rfl:  .  finasteride (PROSCAR) 5 MG tablet, Take 1 tablet (5 mg total) by mouth daily., Disp: 30 tablet, Rfl: 11 .  furosemide (LASIX) 20 MG tablet, Take 1 tablet (20 mg total) by mouth 2 (two) times daily. Take second dose not later than 4pm, Disp: 180 tablet, Rfl: 3 .  irbesartan (AVAPRO) 300 MG tablet, Take 1 tablet (300 mg total) by mouth daily., Disp: 30 tablet, Rfl: 11 .  metFORMIN (GLUCOPHAGE-XR) 500 MG 24 hr tablet, Take 1 tablet (500 mg total) by mouth daily with breakfast., Disp: 90 tablet, Rfl: 0 .  mupirocin cream (BACTROBAN) 2 %, Apply 1 application topically 2 (two) times daily., Disp: 30 g, Rfl: 6 .  potassium chloride SA (K-DUR,KLOR-CON) 20 MEQ tablet, Take  1 tablet (20 mEq total) by mouth 2 (two) times daily., Disp: 60 tablet, Rfl: 5 .  Vitamin D, Cholecalciferol, 1000 units TABS, Take 2 tablets by mouth daily., Disp: , Rfl:  .  tamsulosin (FLOMAX) 0.4 MG CAPS capsule, Take 1 capsule (0.4 mg total) by mouth daily., Disp: 30 capsule, Rfl: 6 Continue all other maintenance medications as listed above.  Follow up plan: Return in about 3 months (around 11/25/2017) for recheck.  Educational handout given for [place] PA-C [facility] [address]  [place], [place] [address] [phone]   08/25/2017, 2:02 PM

## 2017-08-25 NOTE — Patient Instructions (Signed)
In a few days you may receive a survey in the mail or online from [employer] regarding your visit with us today. Please take a moment to fill this out. Your feedback is very important to our whole office. It can help us better understand your needs as well as improve your experience and satisfaction. Thank you for taking your time to complete it. We care about you.  Wylder Macomber, PA-C  

## 2017-08-26 LAB — CMP14+EGFR
A/G RATIO: 1.5 (ref 1.2–2.2)
ALK PHOS: 90 IU/L (ref 39–117)
ALT: 30 IU/L (ref 0–44)
AST: 25 IU/L (ref 0–40)
Albumin: 4.2 g/dL (ref 3.6–4.8)
BUN / CREAT RATIO: 13 (ref 10–24)
BUN: 12 mg/dL (ref 8–27)
Bilirubin Total: 0.9 mg/dL (ref 0.0–1.2)
CALCIUM: 9.3 mg/dL (ref 8.6–10.2)
CHLORIDE: 96 mmol/L (ref 96–106)
CO2: 24 mmol/L (ref 20–29)
Creatinine, Ser: 0.95 mg/dL (ref 0.76–1.27)
GFR calc Af Amer: 98 mL/min/{1.73_m2} (ref >59)
GFR, EST NON AFRICAN AMERICAN: 85 mL/min/{1.73_m2} (ref >59)
GLOBULIN, TOTAL: 2.8 g/dL (ref 1.5–4.5)
Glucose: 239 mg/dL — ABNORMAL HIGH (ref 65–99)
Potassium: 3.4 mmol/L — ABNORMAL LOW (ref 3.5–5.2)
Sodium: 140 mmol/L (ref 134–144)
Total Protein: 7 g/dL (ref 6.0–8.5)

## 2017-08-26 LAB — PSA: Prostate Specific Ag, Serum: 0.2 ng/mL (ref 0.0–4.0)

## 2017-08-27 ENCOUNTER — Other Ambulatory Visit: Payer: Self-pay | Admitting: Physician Assistant

## 2017-08-27 DIAGNOSIS — I10 Essential (primary) hypertension: Secondary | ICD-10-CM

## 2017-08-29 ENCOUNTER — Other Ambulatory Visit: Payer: Self-pay | Admitting: Physician Assistant

## 2017-08-29 DIAGNOSIS — N182 Chronic kidney disease, stage 2 (mild): Principal | ICD-10-CM

## 2017-08-29 DIAGNOSIS — E1122 Type 2 diabetes mellitus with diabetic chronic kidney disease: Secondary | ICD-10-CM

## 2017-09-19 ENCOUNTER — Other Ambulatory Visit: Payer: Self-pay | Admitting: Physician Assistant

## 2017-09-19 DIAGNOSIS — I10 Essential (primary) hypertension: Secondary | ICD-10-CM

## 2017-10-16 DIAGNOSIS — G629 Polyneuropathy, unspecified: Secondary | ICD-10-CM | POA: Diagnosis not present

## 2017-10-16 DIAGNOSIS — I10 Essential (primary) hypertension: Secondary | ICD-10-CM | POA: Diagnosis not present

## 2017-10-16 DIAGNOSIS — Z79899 Other long term (current) drug therapy: Secondary | ICD-10-CM | POA: Diagnosis not present

## 2017-10-16 DIAGNOSIS — M545 Low back pain: Secondary | ICD-10-CM | POA: Diagnosis not present

## 2017-10-16 DIAGNOSIS — Z7984 Long term (current) use of oral hypoglycemic drugs: Secondary | ICD-10-CM | POA: Diagnosis not present

## 2017-10-16 DIAGNOSIS — R739 Hyperglycemia, unspecified: Secondary | ICD-10-CM | POA: Diagnosis not present

## 2017-10-16 DIAGNOSIS — E1165 Type 2 diabetes mellitus with hyperglycemia: Secondary | ICD-10-CM | POA: Diagnosis not present

## 2017-10-26 ENCOUNTER — Other Ambulatory Visit: Payer: PPO

## 2017-10-26 DIAGNOSIS — I10 Essential (primary) hypertension: Secondary | ICD-10-CM | POA: Diagnosis not present

## 2017-10-26 DIAGNOSIS — R809 Proteinuria, unspecified: Secondary | ICD-10-CM | POA: Diagnosis not present

## 2017-10-26 DIAGNOSIS — D509 Iron deficiency anemia, unspecified: Secondary | ICD-10-CM | POA: Diagnosis not present

## 2017-10-26 DIAGNOSIS — E559 Vitamin D deficiency, unspecified: Secondary | ICD-10-CM | POA: Diagnosis not present

## 2017-10-26 DIAGNOSIS — N183 Chronic kidney disease, stage 3 (moderate): Secondary | ICD-10-CM | POA: Diagnosis not present

## 2017-10-26 DIAGNOSIS — Z79899 Other long term (current) drug therapy: Secondary | ICD-10-CM | POA: Diagnosis not present

## 2017-10-28 DIAGNOSIS — M47816 Spondylosis without myelopathy or radiculopathy, lumbar region: Secondary | ICD-10-CM | POA: Diagnosis not present

## 2017-10-28 DIAGNOSIS — M5136 Other intervertebral disc degeneration, lumbar region: Secondary | ICD-10-CM | POA: Diagnosis not present

## 2017-10-28 DIAGNOSIS — M5441 Lumbago with sciatica, right side: Secondary | ICD-10-CM | POA: Diagnosis not present

## 2017-10-28 DIAGNOSIS — G8929 Other chronic pain: Secondary | ICD-10-CM | POA: Diagnosis not present

## 2017-11-02 DIAGNOSIS — E559 Vitamin D deficiency, unspecified: Secondary | ICD-10-CM | POA: Diagnosis not present

## 2017-11-02 DIAGNOSIS — R809 Proteinuria, unspecified: Secondary | ICD-10-CM | POA: Diagnosis not present

## 2017-11-02 DIAGNOSIS — N182 Chronic kidney disease, stage 2 (mild): Secondary | ICD-10-CM | POA: Diagnosis not present

## 2017-11-02 DIAGNOSIS — E876 Hypokalemia: Secondary | ICD-10-CM | POA: Diagnosis not present

## 2017-11-04 DIAGNOSIS — M5136 Other intervertebral disc degeneration, lumbar region: Secondary | ICD-10-CM | POA: Diagnosis not present

## 2017-11-14 DIAGNOSIS — M47816 Spondylosis without myelopathy or radiculopathy, lumbar region: Secondary | ICD-10-CM | POA: Diagnosis not present

## 2017-11-14 DIAGNOSIS — G8929 Other chronic pain: Secondary | ICD-10-CM | POA: Diagnosis not present

## 2017-11-14 DIAGNOSIS — M5136 Other intervertebral disc degeneration, lumbar region: Secondary | ICD-10-CM | POA: Diagnosis not present

## 2017-11-14 DIAGNOSIS — M5441 Lumbago with sciatica, right side: Secondary | ICD-10-CM | POA: Diagnosis not present

## 2017-11-24 DIAGNOSIS — M5136 Other intervertebral disc degeneration, lumbar region: Secondary | ICD-10-CM | POA: Diagnosis not present

## 2017-11-28 ENCOUNTER — Encounter: Payer: Self-pay | Admitting: Physician Assistant

## 2017-11-28 ENCOUNTER — Telehealth: Payer: Self-pay | Admitting: Physician Assistant

## 2017-11-28 ENCOUNTER — Ambulatory Visit (INDEPENDENT_AMBULATORY_CARE_PROVIDER_SITE_OTHER): Payer: PPO | Admitting: Physician Assistant

## 2017-11-28 VITALS — BP 177/75 | HR 79 | Temp 99.3°F | Ht 71.0 in | Wt 226.2 lb

## 2017-11-28 DIAGNOSIS — N401 Enlarged prostate with lower urinary tract symptoms: Secondary | ICD-10-CM

## 2017-11-28 DIAGNOSIS — R3914 Feeling of incomplete bladder emptying: Secondary | ICD-10-CM

## 2017-11-28 DIAGNOSIS — I10 Essential (primary) hypertension: Secondary | ICD-10-CM | POA: Diagnosis not present

## 2017-11-28 DIAGNOSIS — E1122 Type 2 diabetes mellitus with diabetic chronic kidney disease: Secondary | ICD-10-CM

## 2017-11-28 DIAGNOSIS — N182 Chronic kidney disease, stage 2 (mild): Secondary | ICD-10-CM

## 2017-11-28 LAB — BAYER DCA HB A1C WAIVED: HB A1C: 9 % — AB (ref <7.0)

## 2017-11-28 MED ORDER — FINASTERIDE 5 MG PO TABS: 5.0000 mg | ORAL_TABLET | Freq: Every day | ORAL | 11 refills | Status: DC

## 2017-11-28 MED ORDER — CLONIDINE HCL 0.2 MG PO TABS: 0.2000 mg | ORAL_TABLET | Freq: Two times a day (BID) | ORAL | 3 refills | Status: DC

## 2017-11-28 MED ORDER — POTASSIUM CHLORIDE CRYS ER 20 MEQ PO TBCR: EXTENDED_RELEASE_TABLET | ORAL | 3 refills | Status: DC

## 2017-11-28 MED ORDER — CANAGLIFLOZIN 100 MG PO TABS: 100.0000 mg | ORAL_TABLET | Freq: Every day | ORAL | 5 refills | Status: DC

## 2017-11-28 MED ORDER — IRBESARTAN 300 MG PO TABS: 300.0000 mg | ORAL_TABLET | Freq: Every day | ORAL | 11 refills | Status: DC

## 2017-11-28 MED ORDER — AMLODIPINE BESYLATE 10 MG PO TABS: 10.0000 mg | ORAL_TABLET | Freq: Every day | ORAL | 1 refills | Status: DC

## 2017-11-28 MED ORDER — METFORMIN HCL ER 500 MG PO TB24: 500.0000 mg | ORAL_TABLET | Freq: Every day | ORAL | 0 refills | Status: DC

## 2017-11-28 MED ORDER — TAMSULOSIN HCL 0.4 MG PO CAPS: 0.4000 mg | ORAL_CAPSULE | Freq: Every day | ORAL | 6 refills | Status: DC

## 2017-11-28 MED ORDER — FUROSEMIDE 20 MG PO TABS: 20.0000 mg | ORAL_TABLET | Freq: Two times a day (BID) | ORAL | 3 refills | Status: DC

## 2017-11-28 MED ORDER — CANAGLIFLOZIN 100 MG PO TABS: 300.0000 mg | ORAL_TABLET | Freq: Every day | ORAL | 5 refills | Status: DC

## 2017-11-28 NOTE — Telephone Encounter (Signed)
Sent rx back as 100 mg once daily

## 2017-11-28 NOTE — Progress Notes (Signed)
BP (!) 177/75   Pulse 79   Temp 99.3 F (37.4 C) (Oral)   Ht _0  (1.803 m)   Wt 226 lb 3.2 oz (102.6 kg)   BMI 31.55 kg/m    Subjective:    Patient ID: [name], male    DOB: [birth year], 62 y.o.   MRN: [id number]  HPI: [name] is a 62 y.o. male presenting on 11/28/2017 for Follow-up (3 month )  This patient comes in for periodic recheck on medications and conditions including attention, type 2 diabetes, BPH.  All of his medications are reviewed today.  He is due labs today these will be performed.  A PSA was performed in October 2018.  He has no other complaints at this time.  He is trying to reduce his alcohol intake..   All medications are reviewed today. There are no reports of any problems with the medications. All of the medical conditions are reviewed and updated.  Lab work is reviewed and will be ordered as medically necessary. There are no new problems reported with today's visit.   Relevant past medical, surgical, family and social history reviewed and updated as indicated. Allergies and medications reviewed and updated.  Past Medical History:  Diagnosis Date  . Chronic kidney disease   . Diabetes mellitus without complication ([place])   . Hypertension   . Proteinuria     Past Surgical History:  Procedure Laterality Date  . BELPHAROPTOSIS REPAIR    . CARDIAC CATHETERIZATION    . EYE SURGERY    . HEMORRHOID SURGERY      Review of Systems  Constitutional: Negative.  Negative for appetite change and fatigue.  HENT: Negative.   Eyes: Negative.  Negative for pain and visual disturbance.  Respiratory: Negative.  Negative for cough, chest tightness, shortness of breath and wheezing.   Cardiovascular: Negative.  Negative for chest pain, palpitations and leg swelling.  Gastrointestinal: Negative.  Negative for abdominal pain, diarrhea, nausea and vomiting.  Endocrine: Negative.   Genitourinary: Negative.   Musculoskeletal: Positive for arthralgias, back pain  and gait problem.  Skin: Negative.  Negative for color change and rash.  Neurological: Negative for weakness, numbness and headaches.  Psychiatric/Behavioral: Negative.     Allergies as of 11/28/2017   No Known Allergies     Medication List        Accurate as of 11/28/17  1:41 PM. Always use your most recent med list.          amLODipine 10 MG tablet Commonly known as:  NORVASC Take 1 tablet (10 mg total) by mouth daily.   canagliflozin 100 MG Tabs tablet Commonly known as:  INVOKANA Take 1 tablet (100 mg total) by mouth daily before breakfast.   cloNIDine 0.2 MG tablet Commonly known as:  CATAPRES Take 1 tablet (0.2 mg total) by mouth 2 (two) times daily.   finasteride 5 MG tablet Commonly known as:  PROSCAR Take 1 tablet (5 mg total) by mouth daily.   furosemide 20 MG tablet Commonly known as:  LASIX Take 1 tablet (20 mg total) by mouth 2 (two) times daily. Take second dose not later than 4pm   irbesartan 300 MG tablet Commonly known as:  AVAPRO Take 1 tablet (300 mg total) by mouth daily.   metFORMIN 500 MG 24 hr tablet Commonly known as:  GLUCOPHAGE-XR Take 1 tablet (500 mg total) by mouth daily with breakfast.   mupirocin cream 2 % Commonly known as:  BACTROBAN Apply 1 application  topically 2 (two) times daily.   potassium chloride SA 20 MEQ tablet Commonly known as:  K-DUR,KLOR-CON TAKE  (1)  TABLET TWICE A DAY.   tamsulosin 0.4 MG Caps capsule Commonly known as:  FLOMAX Take 1 capsule (0.4 mg total) by mouth daily.   VITAMIN B 12 PO Take by mouth.   Vitamin D (Cholecalciferol) 1000 units Tabs Take 2 tablets by mouth daily.          Objective:    BP (!) 177/75   Pulse 79   Temp 99.3 F (37.4 C) (Oral)   Ht _0  (1.803 m)   Wt 226 lb 3.2 oz (102.6 kg)   BMI 31.55 kg/m   No Known Allergies  Physical Exam  Constitutional: He appears well-developed and well-nourished.  HENT:  Head: Normocephalic and atraumatic.  Eyes: Conjunctivae  and EOM are normal. Pupils are equal, round, and reactive to light.  Neck: Normal range of motion. Neck supple.  Cardiovascular: Normal rate, regular rhythm and normal heart sounds.  Pulmonary/Chest: Effort normal and breath sounds normal.  Abdominal: Soft. Bowel sounds are normal.  Musculoskeletal: Normal range of motion.  Skin: Skin is warm and dry.    Results for orders placed or performed in visit on 11/28/17  Bayer DCA Hb A1c Waived  Result Value Ref Range   Bayer DCA Hb A1c Waived 9.0 (H) <7.0 %      Assessment & Plan:   1. Essential hypertension - amLODipine (NORVASC) 10 MG tablet; Take 1 tablet (10 mg total) by mouth daily.  Dispense: 90 tablet; Refill: 1 - cloNIDine (CATAPRES) 0.2 MG tablet; Take 1 tablet (0.2 mg total) by mouth 2 (two) times daily.  Dispense: 180 tablet; Refill: 3 - furosemide (LASIX) 20 MG tablet; Take 1 tablet (20 mg total) by mouth 2 (two) times daily. Take second dose not later than 4pm  Dispense: 180 tablet; Refill: 3 - irbesartan (AVAPRO) 300 MG tablet; Take 1 tablet (300 mg total) by mouth daily.  Dispense: 30 tablet; Refill: 11 - potassium chloride SA (K-DUR,KLOR-CON) 20 MEQ tablet; TAKE  (1)  TABLET TWICE A DAY.  Dispense: 180 tablet; Refill: 3 - Bayer DCA Hb A1c Waived - CMP14+EGFR - Lipid panel  2. Type 2 diabetes mellitus with stage 2 chronic kidney disease, without long-term current use of insulin (HCC) - metFORMIN (GLUCOPHAGE-XR) 500 MG 24 hr tablet; Take 1 tablet (500 mg total) by mouth daily with breakfast.  Dispense: 90 tablet; Refill: 0 - Bayer DCA Hb A1c Waived - CMP14+EGFR - Lipid panel - canagliflozin (INVOKANA) 100 MG TABS tablet; Take 1 tablet (100 mg total) by mouth daily before breakfast.  Dispense: 30 tablet; Refill: 5  3. Benign prostatic hyperplasia with incomplete bladder emptying - finasteride (PROSCAR) 5 MG tablet; Take 1 tablet (5 mg total) by mouth daily.  Dispense: 30 tablet; Refill: 11 - tamsulosin (FLOMAX) 0.4 MG CAPS  capsule; Take 1 capsule (0.4 mg total) by mouth daily.  Dispense: 30 capsule; Refill: 6    Current Outpatient Medications:  .  amLODipine (NORVASC) 10 MG tablet, Take 1 tablet (10 mg total) by mouth daily., Disp: 90 tablet, Rfl: 1 .  canagliflozin (INVOKANA) 100 MG TABS tablet, Take 1 tablet (100 mg total) by mouth daily before breakfast., Disp: 30 tablet, Rfl: 5 .  cloNIDine (CATAPRES) 0.2 MG tablet, Take 1 tablet (0.2 mg total) by mouth 2 (two) times daily., Disp: 180 tablet, Rfl: 3 .  Cyanocobalamin (VITAMIN B 12 PO), Take by mouth., Disp: ,  Rfl:  .  finasteride (PROSCAR) 5 MG tablet, Take 1 tablet (5 mg total) by mouth daily., Disp: 30 tablet, Rfl: 11 .  furosemide (LASIX) 20 MG tablet, Take 1 tablet (20 mg total) by mouth 2 (two) times daily. Take second dose not later than 4pm, Disp: 180 tablet, Rfl: 3 .  irbesartan (AVAPRO) 300 MG tablet, Take 1 tablet (300 mg total) by mouth daily., Disp: 30 tablet, Rfl: 11 .  metFORMIN (GLUCOPHAGE-XR) 500 MG 24 hr tablet, Take 1 tablet (500 mg total) by mouth daily with breakfast., Disp: 90 tablet, Rfl: 0 .  mupirocin cream (BACTROBAN) 2 %, Apply 1 application topically 2 (two) times daily., Disp: 30 g, Rfl: 6 .  potassium chloride SA (K-DUR,KLOR-CON) 20 MEQ tablet, TAKE  (1)  TABLET TWICE A DAY., Disp: 180 tablet, Rfl: 3 .  tamsulosin (FLOMAX) 0.4 MG CAPS capsule, Take 1 capsule (0.4 mg total) by mouth daily., Disp: 30 capsule, Rfl: 6 .  Vitamin D, Cholecalciferol, 1000 units TABS, Take 2 tablets by mouth daily., Disp: , Rfl:  Continue all other maintenance medications as listed above.  Follow up plan: Return in about 3 months (around 02/26/2018) for recheck.  Educational handout given for [place] PA-C [facility] [address]  [place], [place] [address] [phone]   11/28/2017, 1:41 PM

## 2017-11-28 NOTE — Telephone Encounter (Signed)
Stew from [place].  Is patient to take 1 tablet daily or 3 tablets daily of invokana

## 2017-11-28 NOTE — Patient Instructions (Signed)
In a few days you may receive a survey in the mail or online from [employer] regarding your visit with us today. Please take a moment to fill this out. Your feedback is very important to our whole office. It can help us better understand your needs as well as improve your experience and satisfaction. Thank you for taking your time to complete it. We care about you.  Janellie Tennison, PA-C  

## 2017-11-29 LAB — CMP14+EGFR
A/G RATIO: 1.6 (ref 1.2–2.2)
ALBUMIN: 4 g/dL (ref 3.6–4.8)
ALT: 28 IU/L (ref 0–44)
AST: 21 IU/L (ref 0–40)
Alkaline Phosphatase: 74 IU/L (ref 39–117)
BILIRUBIN TOTAL: 1 mg/dL (ref 0.0–1.2)
BUN/Creatinine Ratio: 14 (ref 10–24)
BUN: 13 mg/dL (ref 8–27)
CHLORIDE: 98 mmol/L (ref 96–106)
CO2: 23 mmol/L (ref 20–29)
Calcium: 8.9 mg/dL (ref 8.6–10.2)
Creatinine, Ser: 0.93 mg/dL (ref 0.76–1.27)
GFR calc non Af Amer: 87 mL/min/{1.73_m2} (ref >59)
GFR, EST AFRICAN AMERICAN: 101 mL/min/{1.73_m2} (ref >59)
Globulin, Total: 2.5 g/dL (ref 1.5–4.5)
Glucose: 277 mg/dL — ABNORMAL HIGH (ref 65–99)
POTASSIUM: 3.5 mmol/L (ref 3.5–5.2)
Sodium: 138 mmol/L (ref 134–144)
TOTAL PROTEIN: 6.5 g/dL (ref 6.0–8.5)

## 2017-11-29 LAB — LIPID PANEL
CHOL/HDL RATIO: 3.7 ratio (ref 0.0–5.0)
Cholesterol, Total: 191 mg/dL (ref 100–199)
HDL: 51 mg/dL (ref >39)
LDL Calculated: 86 mg/dL (ref 0–99)
TRIGLYCERIDES: 271 mg/dL — AB (ref 0–149)
VLDL Cholesterol Cal: 54 mg/dL — ABNORMAL HIGH (ref 5–40)

## 2017-11-30 ENCOUNTER — Ambulatory Visit: Payer: PPO | Attending: Orthopedic Surgery | Admitting: Physical Therapy

## 2017-11-30 ENCOUNTER — Encounter: Payer: Self-pay | Admitting: Physical Therapy

## 2017-11-30 DIAGNOSIS — M545 Low back pain: Secondary | ICD-10-CM | POA: Diagnosis not present

## 2017-11-30 DIAGNOSIS — G8929 Other chronic pain: Secondary | ICD-10-CM | POA: Diagnosis not present

## 2017-11-30 NOTE — Therapy (Signed)
[facility] [place], [place], [address] Phone: [phone]   Fax:  [phone]  Physical Therapy Evaluation  Patient Details  Name: [name] MRN: [id number] Date of Birth: Jul 20, 1956 Referring Provider: Melina Schools MD   Encounter Date: 11/30/2017  PT End of Session - 11/30/17 1347    Visit Number  1    Number of Visits  12    Date for PT Re-Evaluation  12/28/17    PT Start Time  1115    PT Stop Time  1202    PT Time Calculation (min)  47 min    Activity Tolerance  Patient tolerated treatment well    Behavior During Therapy  [facility] for tasks assessed/performed       Past Medical History:  Diagnosis Date  . Chronic kidney disease   . Diabetes mellitus without complication ([place])   . Hypertension   . Proteinuria     Past Surgical History:  Procedure Laterality Date  . BELPHAROPTOSIS REPAIR    . CARDIAC CATHETERIZATION    . EYE SURGERY    . HEMORRHOID SURGERY      There were no vitals filed for this visit.   Subjective Assessment - 11/30/17 1716    Subjective  The patient has a long h/o of low back pain.  Today he rates his pain at a 4/10 but with increased activity his pain will rise to a 7 to 7+/10.  He is not reporting pain into the LE's today though he hasoccasions of right LE pain.  He states he is getting injections.  Rest decreases his pain.    Pertinent History  DM.    Limitations  Walking    How long can you walk comfortably?  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    Diagnostic tests  MRI in 2014 revealing degnerative changes at L4-5 and L5-S1 with bilateral forminal stenosis.    Patient Stated Goals  reduce back pain and get around better.    Currently in Pain?  Yes    Pain Location  Back    Pain Orientation  Mid;Right;Left    Pain Descriptors / Indicators  Aching;Discomfort    Pain Type  Chronic pain    Pain Onset  More than a month ago    Pain Frequency  Constant    Aggravating Factors   See above.    Pain Relieving Factors  See above.         [facility] PT Assessment - 11/30/17 0001      Assessment   Medical Diagnosis  Lumbar DDD    Referring Provider  Melina Schools MD    Onset Date/Surgical Date  -- Ongoing.      Precautions   Precautions  None      Restrictions   Weight Bearing Restrictions  No      Balance Screen   Has the patient fallen in the past 6 months  Yes    How many times?  -- 2.    Has the patient had a decrease in activity level because of a fear of falling?   Yes    Is the patient reluctant to leave their home because of a fear of falling?   Yes      [place] residence      Prior Function   Level of Independence  Independent      Posture/Postural Control   Posture/Postural Control  Postural limitations    Postural Limitations  Decreased lumbar lordosis      ROM / Strength   AROM / PROM / Strength  AROM;Strength      AROM   Overall AROM Comments  Active lumbar extension to 20 degrees and flexion 75% of normal but painful with both motions.  Resumption of posture and endrange extension are particularly painful.      Strength   Overall Strength Comments  Normal bilateral LE strength.      Palpation   Palpation comment  Patient c/o pain "in" spine with referral of pain across and bilaterally from L4 to S1.  No palpable soft tissue pain reported today.      Special Tests    Special Tests  -- Bil Pat DTR's= 2+/4+.  Bill ACH's are absent.    Other special tests  (=) leg lengths; (-) SLR and FABER testing.      Bed Mobility   Bed Mobility  -- Performed slowly but independently.      Ambulation/Gait   Gait Comments  Essentially normal upon observation today though his decreased cadence suggested he was in pain.             Objective measurements completed on examination: See above findings.      [facility] Adult PT Treatment/Exercise - 11/30/17 0001      Modalities    Modalities  Electrical Stimulation;Moist Heat      Moist Heat Therapy   Number Minutes Moist Heat  20 Minutes    Moist Heat Location  Lumbar Spine      Electrical Stimulation   Electrical Stimulation Location  Lower lumbar.    Electrical Stimulation Action  IFC    Electrical Stimulation Parameters  80-150 Hz x 20 minutes.    Electrical Stimulation Goals  Pain                  PT Long Term Goals - 11/30/17 1749      PT LONG TERM GOAL #1   Title  Independent with a HEP.    Time  4    Period  Weeks    Status  New      PT LONG TERM GOAL #2   Title  Stand 20 minutes with pain not > 3/10.    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    Time  4    Period  Weeks    Status  New             Plan - 11/30/17 1740    Clinical Impression Statement  The patient prsents to OPPT with chronic low back pain with occasional radiation of pain into his right LE.  He has a loss of lumbar range of motion and a loss of his lumbar lordosis.   Increase mobility results in an increase in pain.  Rest decreases his pain.  His LE strength was normal.  Bilateral Achille's reflexes are absent.      History and Personal Factors relevant to plan of care:  DM.  Long h/o low back pain.    Clinical Presentation  Evolving    Clinical Presentation due to:  Not improving significantly.    Clinical Decision Making  Low    Rehab Potential  Good    PT Frequency  3x / week    PT Duration  4 weeks    PT Treatment/Interventions  ADLs/Self Care Home Management;Cryotherapy;Electrical Stimulation;Ultrasound;Traction;Moist Heat;Therapeutic activities;Therapeutic exercise;Patient/family education;Manual techniques    PT Next Visit Plan  Begin core exercise progression.  PA mobs to lumbar spine.  Patient may benefit from intermittment traction beginning at 35# body weight.  Modalites as needed.    Consulted and Agree with  Plan of Care  Patient       Patient will benefit from skilled therapeutic intervention in order to improve the following deficits and impairments:  Decreased activity tolerance, Postural dysfunction, Pain, Decreased range of motion  Visit Diagnosis: Chronic midline low back pain, with sciatica presence unspecified - Plan: PT plan of care cert/re-cert     Problem List Patient Active Problem List   Diagnosis Date Noted  . 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 Essential hypertension 02/23/2017  . Benign prostatic hyperplasia with incomplete bladder emptying 02/23/2017  . Vitamin D deficiency 02/23/2017  . Hyperlipidemia associated with type 2 diabetes mellitus ([place]) 02/23/2017  . Type 2 diabetes mellitus with stage 2 chronic kidney disease, without long-term current use of insulin ([place]) 02/23/2017    Marquon Alcala, [place]  MPT 11/30/2017, 5:52 PM  [facility] [place], [place], [address] Phone: [phone]   Fax:  [phone]  Name: [name] MRN: [id number] Date of Birth: [birth year]

## 2017-12-05 ENCOUNTER — Ambulatory Visit: Payer: PPO | Admitting: Physical Therapy

## 2017-12-05 ENCOUNTER — Encounter: Payer: Self-pay | Admitting: Physical Therapy

## 2017-12-05 DIAGNOSIS — M545 Low back pain: Secondary | ICD-10-CM | POA: Diagnosis not present

## 2017-12-05 DIAGNOSIS — G8929 Other chronic pain: Secondary | ICD-10-CM

## 2017-12-05 NOTE — Therapy (Signed)
[facility] [place], [place], [address] Phone: [phone]   Fax:  [phone]  Physical Therapy Treatment  Patient Details  Name: [name] MRN: [id number] Date of Birth: 1956-05-26 Referring Provider: Melina Schools MD   Encounter Date: 12/05/2017  PT End of Session - 12/05/17 1153    Visit Number  2    Number of Visits  12    Date for PT Re-Evaluation  12/28/17    PT Start Time  1116    PT Stop Time  1201    PT Time Calculation (min)  45 min    Activity Tolerance  Patient tolerated treatment well    Behavior During Therapy  [facility] for tasks assessed/performed       Past Medical History:  Diagnosis Date  . Chronic kidney disease   . Diabetes mellitus without complication ([place])   . Hypertension   . Proteinuria     Past Surgical History:  Procedure Laterality Date  . BELPHAROPTOSIS REPAIR    . CARDIAC CATHETERIZATION    . EYE SURGERY    . HEMORRHOID SURGERY      There were no vitals filed for this visit.  Subjective Assessment - 12/05/17 1118    Subjective  Patient arrived with ongoing pain    Pertinent History  DM.    Limitations  Walking    How long can you walk comfortably?  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    Diagnostic tests  MRI in 2014 revealing degnerative changes at L4-5 and L5-S1 with bilateral forminal stenosis.    Patient Stated Goals  reduce back pain and get around better.    Currently in Pain?  Yes    Pain Score  4     Pain Location  Back    Pain Orientation  Right;Left;Lower    Pain Descriptors / Indicators  Aching;Discomfort    Pain Type  Chronic pain    Pain Onset  More than a month ago    Pain Frequency  Constant    Aggravating Factors   prolong activity    Pain Relieving Factors  rest meds                      [facility] Adult PT Treatment/Exercise - 12/05/17 0001      Self-Care   Self-Care  ADL's;Lifting;Posture;Other Self-Care Comments     Other Self-Care Comments   HEP given for all the following posture techniques      Exercises   Exercises  Lumbar      Lumbar Exercises: Supine   Ab Set  20 reps;3 seconds    Glut Set  20 reps;3 seconds    Clam  20 reps;3 seconds    Bent Knee Raise  20 reps;3 seconds    Bridge  20 reps;2 seconds    Straight Leg Raise  3 seconds 2x10      Moist Heat Therapy   Number Minutes Moist Heat  15 Minutes    Moist Heat Location  Lumbar Spine      Electrical Stimulation   Electrical Stimulation Location  Lower lumbar.    Electrical Stimulation Action  IFC    Electrical Stimulation Parameters  80-150hz  x88min    Electrical Stimulation Goals  Pain             PT Education - 12/05/17 1150    Education provided  Yes    Education Details  posture  awareness techniques/core activation with ex's    Person(s) Educated  Patient    Methods  Explanation;Demonstration;Handout    Comprehension  Verbalized understanding;Returned demonstration          PT Long Term Goals - 12/05/17 1152      PT LONG TERM GOAL #1   Title  Independent with a HEP.    Time  4    Period  Weeks    Status  On-going      PT LONG TERM GOAL #2   Title  Stand 20 minutes with pain not > 3/10.    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    Time  4    Period  Weeks    Status  On-going            Plan - 12/05/17 1154    Clinical Impression Statement  Patient tolerated treatment well today. Today focuesd on education for posture and core activation with exercies. Educated patient on all posture awareness techniques and core activation in supine with progression, HEP provided. Patient reported no increased pain and was able to complte all exercises with ease except bridges were difficult for patient. Patient progressing toward goals.     Rehab Potential  Good    PT Frequency  3x / week    PT Duration  4 weeks     PT Treatment/Interventions  ADLs/Self Care Home Management;Cryotherapy;Electrical Stimulation;Ultrasound;Traction;Moist Heat;Therapeutic activities;Therapeutic exercise;Patient/family education;Manual techniques    PT Next Visit Plan  cont with core exercise progression from supine to sit/standing and may consider nustep if appropriate.  PA mobs to lumbar spine.  Patient may benefit from intermittment traction beginning at 35# body weight.  Modalites as needed.    Consulted and Agree with Plan of Care  Patient       Patient will benefit from skilled therapeutic intervention in order to improve the following deficits and impairments:  Decreased activity tolerance, Postural dysfunction, Pain, Decreased range of motion  Visit Diagnosis: Chronic midline low back pain, with sciatica presence unspecified     Problem List Patient Active Problem List   Diagnosis Date Noted  . 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 Essential hypertension 02/23/2017  . Benign prostatic hyperplasia with incomplete bladder emptying 02/23/2017  . Vitamin D deficiency 02/23/2017  . Hyperlipidemia associated with type 2 diabetes mellitus ([place]) 02/23/2017  . Type 2 diabetes mellitus with stage 2 chronic kidney disease, without long-term current use of insulin ([place]) 02/23/2017    [name], PTA 12/05/2017, 12:02 PM  [facility] [place], [place], [address] Phone: [phone]   Fax:  [phone]  Name: [name] MRN: [id number] Date of Birth: Oct 07, 1956

## 2017-12-05 NOTE — Patient Instructions (Signed)
Pelvic Tilt: Posterior - Legs Bent (Supine)   Tighten stomach and flatten back by rolling pelvis down. Hold _10___ seconds. Relax. Repeat _10-30___ times per set. Do __2__ sets per session. Do _2___ sessions per day.   . Straight Leg Raise   Tighten stomach and slowly raise locked right leg __4__ inches from floor. Repeat __10-30__ times per set. Do __2__ sets per session. Do __2__ sessions per day.    Bent Leg Lift (Hook-Lying)   Tighten stomach and slowly raise right leg _5___ inches from floor. Keep trunk rigid. Hold _3___ seconds. Repeat _10___ times per set. Do ___2-3_ sets per session. Do __2__ sessions per day.    Bridging  Slowly raise buttocks from floor, keeping stomach tight. Repeat _10___ times per set. Do __2__ sets per session. Do __2__ sessions per day.   Brushing Teeth    Place one foot on ledge and one hand on counter. Bend other knee slightly to keep back straight.  Copyright  VHI. All rights reserved.  Refrigerator   Squat with knees apart to reach lower shelves and drawers.   Copyright  VHI. All rights reserved.  Laundry [employer] down and hold basket close to stand. Use leg muscles to do the work.   Copyright  VHI. All rights reserved.  Housework - Vacuuming   Hold the vacuum with arm held at side. Step back and forth to move it, keeping head up. Avoid twisting.   Copyright  VHI. All rights reserved.  Housework - Wiping   Position yourself as close as possible to reach work surface. Avoid straining your back.   Copyright  VHI. All rights reserved.  Gardening - Mowing   Keep arms close to sides and walk with lawn mower.   Copyright  VHI. All rights reserved.  Sleeping on Side   Place pillow between knees. Use cervical support under neck and a roll around waist as needed.   Copyright  VHI. All rights reserved.  Log Roll   Lying on back, bend left knee and place left arm across chest. Roll all in one  movement to the right. Reverse to roll to the left. Always move as one unit.   Copyright  VHI. All rights reserved.  Stand to Sit / Sit to Stand   To sit: Bend knees to lower self onto front edge of chair, then scoot back on seat. To stand: Reverse sequence by placing one foot forward, and scoot to front of seat. Use rocking motion to stand up.  Copyright  VHI. All rights reserved.  Posture - Standing   Good posture is important. Avoid slouching and forward head thrust. Maintain curve in low back and align ears over shoul- ders, hips over ankles.   Copyright  VHI. All rights reserved.  Posture - Sitting   Sit upright, head facing forward. Try using a roll to support lower back. Keep shoulders relaxed, and avoid rounded back. Keep hips level with knees. Avoid crossing legs for long periods.   Copyright  VHI. All rights reserved.  Computer Work   Position work to Programmer, multimedia. Use proper work and seat height. Keep shoulders back and down, wrists straight, and elbows at right angles. Use chair that provides full back support. Add footrest and lumbar roll as needed.   Copyright  VHI. All rights reserved.

## 2017-12-08 ENCOUNTER — Ambulatory Visit: Payer: PPO | Admitting: Physical Therapy

## 2017-12-08 ENCOUNTER — Encounter: Payer: Self-pay | Admitting: Physical Therapy

## 2017-12-08 DIAGNOSIS — G8929 Other chronic pain: Secondary | ICD-10-CM

## 2017-12-08 DIAGNOSIS — M545 Low back pain: Secondary | ICD-10-CM | POA: Diagnosis not present

## 2017-12-08 NOTE — Therapy (Signed)
[facility] [place], [place], [address] Phone: [phone]   Fax:  [phone]  Physical Therapy Treatment  Patient Details  Name: Opal Mckellips MRN: [id number] Date of Birth: Sep 18, 1956 Referring Provider: Melina Schools MD   Encounter Date: 12/08/2017  PT End of Session - 12/08/17 1305    Visit Number  3    Number of Visits  12    Date for PT Re-Evaluation  12/28/17    PT Start Time  7035    PT Stop Time  1349    PT Time Calculation (min)  44 min    Activity Tolerance  Patient tolerated treatment well    Behavior During Therapy  [facility] for tasks assessed/performed       Past Medical History:  Diagnosis Date  . Chronic kidney disease   . Diabetes mellitus without complication ([place])   . Hypertension   . Proteinuria     Past Surgical History:  Procedure Laterality Date  . BELPHAROPTOSIS REPAIR    . CARDIAC CATHETERIZATION    . EYE SURGERY    . HEMORRHOID SURGERY      There were no vitals filed for this visit.  Subjective Assessment - 12/08/17 1304    Subjective  Still having pain at this time but up for trying traction.    Pertinent History  DM.    Limitations  Walking    How long can you walk comfortably?  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    Diagnostic tests  MRI in 2014 revealing degnerative changes at L4-5 and L5-S1 with bilateral forminal stenosis.    Patient Stated Goals  reduce back pain and get around better.    Currently in Pain?  Yes    Pain Score  4     Pain Location  Back    Pain Orientation  Lower    Pain Descriptors / Indicators  Discomfort    Pain Type  Chronic pain    Pain Onset  More than a month ago    Pain Frequency  Constant    Aggravating Factors   Bending, activity    Pain Relieving Factors  Rest, medications         [facility] PT Assessment - 12/08/17 0001      Assessment   Medical Diagnosis  Lumbar DDD    Next MD Visit  12/2017      Precautions   Precautions  None      Restrictions   Weight Bearing Restrictions  No                  [facility] Adult PT Treatment/Exercise - 12/08/17 0001      Lumbar Exercises: Stretches   Single Knee to Chest Stretch  Right;Left;2 reps;30 seconds    Figure 4 Stretch  2 reps;30 seconds;Supine;With overpressure      Lumbar Exercises: Seated   Long Arc Quad on Chair  Strengthening;Both;20 reps;Weights    LAQ on Chair Weights (lbs)  4      Lumbar Exercises: Supine   Ab Set  20 reps;5 seconds    Clam  20 reps;3 seconds    Bent Knee Raise  20 reps;2 seconds    Bridge  20 reps    Straight Leg Raise  20 reps;2 seconds      Modalities   Modalities  Traction      Traction   Type of Traction  Lumbar    Min (lbs)  5    Max (lbs)  90    Hold Time  99    Rest Time  5    Time  15                  PT Long Term Goals - 12/05/17 1152      PT LONG TERM GOAL #1   Title  Independent with a HEP.    Time  4    Period  Weeks    Status  On-going      PT LONG TERM GOAL #2   Title  Stand 20 minutes with pain not > 3/10.    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    Time  4    Period  Weeks    Status  On-going            Plan - 12/08/17 1341    Clinical Impression Statement  Patient tolerated today's treatment well although he continues to arrive with reports of LBP but no radiation into LEs. Patient encouraged throughout treatment for core activation for core strengthening even into seated LAQ. 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 Patient interested in trying lumbar traction which was conducted at 90# max traction. Normal response to 90# traction with patient educated to monitor symptoms to assess improvement from traction until next treatment.    Rehab Potential  Good    PT Frequency  3x / week     PT Duration  4 weeks    PT Treatment/Interventions  ADLs/Self Care Home Management;Cryotherapy;Electrical Stimulation;Ultrasound;Traction;Moist Heat;Therapeutic activities;Therapeutic exercise;Patient/family education;Manual techniques    PT Next Visit Plan  Continue core and functional strengthening with modalities per symptoms.    Consulted and Agree with Plan of Care  Patient       Patient will benefit from skilled therapeutic intervention in order to improve the following deficits and impairments:  Decreased activity tolerance, Postural dysfunction, Pain, Decreased range of motion  Visit Diagnosis: Chronic midline low back pain, with sciatica presence unspecified     Problem List Patient Active Problem List   Diagnosis Date Noted  . 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 Essential hypertension 02/23/2017  . Benign prostatic hyperplasia with incomplete bladder emptying 02/23/2017  . Vitamin D deficiency 02/23/2017  . Hyperlipidemia associated with type 2 diabetes mellitus ([place]) 02/23/2017  . Type 2 diabetes mellitus with stage 2 chronic kidney disease, without long-term current use of insulin ([place]) 02/23/2017    Standley Brooking, PTA 12/08/2017, 1:53 PM  [facility] [address] [place], [place], [address] Phone: [phone]   Fax:  [phone]  Name: Jahking Lesser MRN: [id number] Date of Birth: 1956-08-08

## 2017-12-13 ENCOUNTER — Encounter: Payer: Self-pay | Admitting: Physical Therapy

## 2017-12-13 ENCOUNTER — Ambulatory Visit: Payer: PPO | Admitting: Physical Therapy

## 2017-12-13 DIAGNOSIS — G8929 Other chronic pain: Secondary | ICD-10-CM

## 2017-12-13 DIAGNOSIS — M545 Low back pain: Secondary | ICD-10-CM | POA: Diagnosis not present

## 2017-12-13 NOTE — Therapy (Signed)
[facility] [place], [place], [address] Phone: [phone]   Fax:  [phone]  Physical Therapy Treatment  Patient Details  Name: Jourdon Zimmerle MRN: [id number] Date of Birth: Jul 15, 1956 Referring Provider: Melina Schools MD   Encounter Date: 12/13/2017  PT End of Session - 12/13/17 1305    Visit Number  4    Number of Visits  12    Date for PT Re-Evaluation  12/28/17    PT Start Time  3818    PT Stop Time  1349    PT Time Calculation (min)  46 min    Activity Tolerance  Patient tolerated treatment well    Behavior During Therapy  [facility] for tasks assessed/performed       Past Medical History:  Diagnosis Date  . Chronic kidney disease   . Diabetes mellitus without complication ([place])   . Hypertension   . Proteinuria     Past Surgical History:  Procedure Laterality Date  . BELPHAROPTOSIS REPAIR    . CARDIAC CATHETERIZATION    . EYE SURGERY    . HEMORRHOID SURGERY      There were no vitals filed for this visit.  Subjective Assessment - 12/13/17 1304    Subjective  Reports that he had to lift 12 bags of feed into his truck yesterday and they 50 lbs each bag. Reports he has increased pain from that and after loading the feed by himself he had to stop.    Pertinent History  DM.    Limitations  Walking    How long can you walk comfortably?  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    Diagnostic tests  MRI in 2014 revealing degnerative changes at L4-5 and L5-S1 with bilateral forminal stenosis.    Patient Stated Goals  reduce back pain and get around better.    Currently in Pain?  Yes    Pain Score  5     Pain Location  Back    Pain Orientation  Lower    Pain Descriptors / Indicators  Aching    Pain Type  Chronic pain    Pain Onset  More than a month ago    Pain Frequency  Constant         [facility] PT Assessment - 12/13/17 0001      Assessment   Medical Diagnosis  Lumbar DDD    Next MD  Visit  12/2017      Precautions   Precautions  None      Restrictions   Weight Bearing Restrictions  No                  [facility] Adult PT Treatment/Exercise - 12/13/17 0001      Lumbar Exercises: Aerobic   Nustep  NuStep L4 x12 min      Lumbar Exercises: Standing   Shoulder Extension  Strengthening;Both;Limitations    Shoulder Extension Limitations  Pink XTS 3x10 reps    Other Standing Lumbar Exercises  B chop wood pink XTS x20 reps      Lumbar Exercises: Seated   Long Arc Quad on Chair  Strengthening;Both;Weights;3 sets;10 reps    LAQ on Chair Weights (lbs)  4      Lumbar Exercises: Supine   Bent Knee Raise  20 reps;2 seconds    Bridge  20 reps    Straight Leg Raise  20 reps;2 seconds      Modalities   Modalities  Electrical Stimulation;Moist Heat      Moist Heat Therapy   Number Minutes Moist Heat  15 Minutes    Moist Heat Location  Lumbar Spine      Electrical Stimulation   Electrical Stimulation Location  B lumbar paraspinals    Electrical Stimulation Action  IFC    Electrical Stimulation Parameters  1-10 hz x15 min 30 setting per patient request    Electrical Stimulation Goals  Pain                  PT Long Term Goals - 12/05/17 1152      PT LONG TERM GOAL #1   Title  Independent with a HEP.    Time  4    Period  Weeks    Status  On-going      PT LONG TERM GOAL #2   Title  Stand 20 minutes with pain not > 3/10.    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    Time  4    Period  Weeks    Status  On-going            Plan - 12/13/17 1335    Clinical Impression Statement  Patient arrived to treatment with spike in pain following loading 50 lb bag of feed repeatedly which has caused increased pain as he rated pain prior to loading to approximately 2/10. Patient guided through various core/lumbar strengthening exercises with emphasis on core  activation throughout treatment. No complaint of pain was provided by patient at any point in treatment. Patient opted to continue stimulation and moist heat as he prefers stim and heat better. Normal modalities response noted following removal of the modalities.    Rehab Potential  Good    PT Frequency  3x / week    PT Duration  4 weeks    PT Treatment/Interventions  ADLs/Self Care Home Management;Cryotherapy;Electrical Stimulation;Ultrasound;Traction;Moist Heat;Therapeutic activities;Therapeutic exercise;Patient/family education;Manual techniques    PT Next Visit Plan  Continue core and functional strengthening with modalities per symptoms.    Consulted and Agree with Plan of Care  Patient       Patient will benefit from skilled therapeutic intervention in order to improve the following deficits and impairments:  Decreased activity tolerance, Postural dysfunction, Pain, Decreased range of motion  Visit Diagnosis: Chronic midline low back pain, with sciatica presence unspecified     Problem List Patient Active Problem List   Diagnosis Date Noted  . Hypertensive kidney disease with CKD (chronic kidney disease) stage V ([place]) 02/23/2017  . Essential hypertension 02/23/2017  . Benign prostatic hyperplasia with incomplete bladder emptying 02/23/2017  . Vitamin D deficiency 02/23/2017  . Hyperlipidemia associated with type 2 diabetes mellitus ([place]) 02/23/2017  . Type 2 diabetes mellitus with stage 2 chronic kidney disease, without long-term current use of insulin ([place]) 02/23/2017    Standley Brooking, PTA 12/13/2017, 1:58 PM  [facility] [address] [place], [place], [address] Phone: [phone]   Fax:  [phone]  Name: Kashon Kraynak MRN: [id number] Date of Birth: [birth year]

## 2017-12-15 ENCOUNTER — Ambulatory Visit: Payer: PPO | Admitting: Physical Therapy

## 2017-12-15 ENCOUNTER — Encounter: Payer: Self-pay | Admitting: Physical Therapy

## 2017-12-15 DIAGNOSIS — G8929 Other chronic pain: Secondary | ICD-10-CM

## 2017-12-15 DIAGNOSIS — M545 Low back pain: Principal | ICD-10-CM

## 2017-12-15 NOTE — Therapy (Signed)
[facility] [place], [place], [address] Phone: [phone]   Fax:  [phone]  Physical Therapy Treatment  Patient Details  Name: [name] MRN: [id number] Date of Birth: [birth year] Referring Provider: Melina Schools MD   Encounter Date: 12/15/2017  PT End of Session - 12/15/17 1307    Visit Number  5    Number of Visits  12    Date for PT Re-Evaluation  12/28/17    PT Start Time  6270    PT Stop Time  1346    PT Time Calculation (min)  43 min    Activity Tolerance  Patient tolerated treatment well    Behavior During Therapy  [facility] for tasks assessed/performed       Past Medical History:  Diagnosis Date  . Chronic kidney disease   . Diabetes mellitus without complication ([place])   . Hypertension   . Proteinuria     Past Surgical History:  Procedure Laterality Date  . BELPHAROPTOSIS REPAIR    . CARDIAC CATHETERIZATION    . EYE SURGERY    . HEMORRHOID SURGERY      There were no vitals filed for this visit.  Subjective Assessment - 12/15/17 1306    Subjective  Reports that his back feels a little better today.    Pertinent History  DM.    Limitations  Walking    How long can you walk comfortably?  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    Diagnostic tests  MRI in 2014 revealing degnerative changes at L4-5 and L5-S1 with bilateral forminal stenosis.    Patient Stated Goals  reduce back pain and get around better.    Currently in Pain?  Yes    Pain Score  4     Pain Location  Back    Pain Orientation  Lower    Pain Descriptors / Indicators  Discomfort    Pain Type  Chronic pain    Pain Onset  More than a month ago         [facility] PT Assessment - 12/15/17 0001      Assessment   Medical Diagnosis  Lumbar DDD    Next MD Visit  12/2017      Precautions   Precautions  None      Restrictions   Weight Bearing Restrictions  No                  [facility] Adult PT  Treatment/Exercise - 12/15/17 0001      Lumbar Exercises: Aerobic   Nustep  NuStep L5 x12 min      Lumbar Exercises: Machines for Strengthening   Cybex Lumbar Extension  70# x20 reps    Cybex Knee Extension  20# 2x10 reps      Lumbar Exercises: Standing   Row  Strengthening;Both;20 reps;Limitations    Row Limitations  Pink XTS    Other Standing Lumbar Exercises  B chop wood pink XTS x20 reps    Other Standing Lumbar Exercises  B core punches pink XTS x20 reps, B lat pulldown pink XTS x20 reps      Lumbar Exercises: Supine   Bridge  20 reps;2 seconds    Straight Leg Raise  20 reps;2 seconds      Lumbar Exercises: Sidelying   Hip Abduction  Both;20 reps      Modalities   Modalities  Electrical Stimulation;Moist Heat      Moist Heat  Therapy   Number Minutes Moist Heat  15 Minutes    Moist Heat Location  Lumbar Spine      Electrical Stimulation   Electrical Stimulation Location  B lumbar paraspinals    Electrical Stimulation Action  IFC    Electrical Stimulation Parameters  80-150 hz x15 min    Electrical Stimulation Goals  Pain                  PT Long Term Goals - 12/05/17 1152      PT LONG TERM GOAL #1   Title  Independent with a HEP.    Time  4    Period  Weeks    Status  On-going      PT LONG TERM GOAL #2   Title  Stand 20 minutes with pain not > 3/10.    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    Time  4    Period  Weeks    Status  On-going            Plan - 12/15/17 1334    Clinical Impression Statement  Patient tolerated today's treatment well as he arrived with reduced LBP and had no complaints of any increased pain during exercises. Patient guided through more core and LE strengthening with VCs for core activation throughout treatment. Normal modalities response noted following removal of the modalities.    Rehab Potential  Good    PT  Frequency  3x / week    PT Duration  4 weeks    PT Treatment/Interventions  ADLs/Self Care Home Management;Cryotherapy;Electrical Stimulation;Ultrasound;Traction;Moist Heat;Therapeutic activities;Therapeutic exercise;Patient/family education;Manual techniques    PT Next Visit Plan  Continue core and functional strengthening with modalities per symptoms.    Consulted and Agree with Plan of Care  Patient       Patient will benefit from skilled therapeutic intervention in order to improve the following deficits and impairments:  Decreased activity tolerance, Postural dysfunction, Pain, Decreased range of motion  Visit Diagnosis: Chronic midline low back pain, with sciatica presence unspecified     Problem List Patient Active Problem List   Diagnosis Date Noted  . 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 Essential hypertension 02/23/2017  . Benign prostatic hyperplasia with incomplete bladder emptying 02/23/2017  . Vitamin D deficiency 02/23/2017  . Hyperlipidemia associated with type 2 diabetes mellitus ([place]) 02/23/2017  . Type 2 diabetes mellitus with stage 2 chronic kidney disease, without long-term current use of insulin ([place]) 02/23/2017    Standley Brooking, PTA 12/15/2017, 1:50 PM  [facility] [address] [place], [place], [address] Phone: [phone]   Fax:  [phone]  Name: [name] MRN: [id number] Date of Birth: 1956-01-23

## 2017-12-20 ENCOUNTER — Ambulatory Visit: Payer: PPO | Attending: Orthopedic Surgery | Admitting: Physical Therapy

## 2017-12-20 DIAGNOSIS — G8929 Other chronic pain: Secondary | ICD-10-CM | POA: Insufficient documentation

## 2017-12-20 DIAGNOSIS — M545 Low back pain: Secondary | ICD-10-CM | POA: Diagnosis not present

## 2017-12-20 NOTE — Therapy (Signed)
[facility] [place], [place], [address] Phone: [phone]   Fax:  [phone]  Physical Therapy Treatment  Patient Details  Name: [name] MRN: [id number] Date of Birth: May 12, 1956 Referring Provider: Melina Schools MD   Encounter Date: 12/20/2017  PT End of Session - 12/20/17 1302    Visit Number  6    Number of Visits  12    Date for PT Re-Evaluation  12/28/17    PT Start Time  1301    PT Stop Time  1351    PT Time Calculation (min)  50 min    Activity Tolerance  Patient tolerated treatment well    Behavior During Therapy  [facility] for tasks assessed/performed       Past Medical History:  Diagnosis Date  . Chronic kidney disease   . Diabetes mellitus without complication ([place])   . Hypertension   . Proteinuria     Past Surgical History:  Procedure Laterality Date  . BELPHAROPTOSIS REPAIR    . CARDIAC CATHETERIZATION    . EYE SURGERY    . HEMORRHOID SURGERY      There were no vitals filed for this visit.  Subjective Assessment - 12/20/17 1318    Subjective  Patient reports feeling "alright" 4/10 pain.    Pertinent History  DM.    Limitations  Walking    How long can you walk comfortably?  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    Diagnostic tests  MRI in 2014 revealing degnerative changes at L4-5 and L5-S1 with bilateral forminal stenosis.    Patient Stated Goals  reduce back pain and get around better.    Currently in Pain?  Yes    Pain Score  4     Pain Location  Back    Pain Orientation  Lower    Pain Descriptors / Indicators  Discomfort    Pain Type  Chronic pain    Pain Onset  More than a month ago                      [facility] Adult PT Treatment/Exercise - 12/20/17 0001      Lumbar Exercises: Aerobic   Nustep  Level 5 x10 minutes      Lumbar Exercises: Machines for Strengthening   Cybex Lumbar Extension  70# x20 reps    Leg Press  20# seat 8, with  emphasis of draw in.      Lumbar Exercises: Standing   Row  Strengthening 2 minutes, Pink XTS    Shoulder Extension  Strengthening 2 minutes; Pink XTS      Lumbar Exercises: Sidelying   Clam  Both Red TB 2 minutes each side.      Modalities   Modalities  Electrical Stimulation      Moist Heat Therapy   Number Minutes Moist Heat  15 Minutes    Moist Heat Location  Lumbar Spine      Electrical Stimulation   Electrical Stimulation Location  B lumbar paraspinals    Electrical Stimulation Action  IFC    Electrical Stimulation Parameters  80-150 Hz x15    Electrical Stimulation Goals  Pain                  PT Long Term Goals - 12/05/17 1152      PT LONG TERM GOAL #1   Title  Independent with a HEP.    Time  4    Period  Weeks    Status  On-going      PT LONG TERM GOAL #2   Title  Stand 20 minutes with pain not > 3/10.    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    Time  4    Period  Weeks    Status  On-going            Plan - 12/20/17 1351    Clinical Impression Statement  Patient was able complete exercises without any increase of pain. Patient stated "i feel it" while performing sidelying clam shells but no increase of pain. Patient was instructed to use heat for 20 minutes or ice for 10 minutes with towel to protect skin PRN for pain relief. Patient in agreement.    Clinical Presentation  Evolving    Clinical Decision Making  Low    Rehab Potential  Good    PT Frequency  3x / week    PT Duration  4 weeks    PT Treatment/Interventions  ADLs/Self Care Home Management;Cryotherapy;Electrical Stimulation;Ultrasound;Traction;Moist Heat;Therapeutic activities;Therapeutic exercise;Patient/family education;Manual techniques    PT Next Visit Plan  Continue core and functional strengthening with modalities per symptoms.    Consulted and Agree with Plan of Care  Patient        Patient will benefit from skilled therapeutic intervention in order to improve the following deficits and impairments:  Decreased activity tolerance, Postural dysfunction, Pain, Decreased range of motion  Visit Diagnosis: Chronic midline low back pain, with sciatica presence unspecified     Problem List Patient Active Problem List   Diagnosis Date Noted  . 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 Essential hypertension 02/23/2017  . Benign prostatic hyperplasia with incomplete bladder emptying 02/23/2017  . Vitamin D deficiency 02/23/2017  . Hyperlipidemia associated with type 2 diabetes mellitus ([place]) 02/23/2017  . Type 2 diabetes mellitus with stage 2 chronic kidney disease, without long-term current use of insulin ([place]) 02/23/2017   [name], PT, DPT 12/20/2017, 3:07 PM  [facility] [address] [place], [place], [address] Phone: [phone]   Fax:  [phone]  Name: [name] MRN: [id number] Date of Birth: 11-Mar-1956

## 2017-12-22 ENCOUNTER — Ambulatory Visit: Payer: PPO | Admitting: Physical Therapy

## 2017-12-22 DIAGNOSIS — G8929 Other chronic pain: Secondary | ICD-10-CM

## 2017-12-22 DIAGNOSIS — M545 Low back pain: Principal | ICD-10-CM

## 2017-12-22 NOTE — Therapy (Addendum)
[facility] [place], [place], [address] Phone: [phone]   Fax:  [phone]  Physical Therapy Treatment/Discharge  Patient Details  Name: [name] MRN: [id number] Date of Birth: April 16, 1956 Referring Provider: Melina Schools MD   Encounter Date: 12/22/2017  PT End of Session - 12/22/17 1303    Visit Number  7    Number of Visits  12    Date for PT Re-Evaluation  12/28/17    PT Start Time  1301    PT Stop Time  1349    PT Time Calculation (min)  48 min    Activity Tolerance  Patient tolerated treatment well    Behavior During Therapy  [facility] for tasks assessed/performed       Past Medical History:  Diagnosis Date  . Chronic kidney disease   . Diabetes mellitus without complication ([place])   . Hypertension   . Proteinuria     Past Surgical History:  Procedure Laterality Date  . BELPHAROPTOSIS REPAIR    . CARDIAC CATHETERIZATION    . EYE SURGERY    . HEMORRHOID SURGERY      There were no vitals filed for this visit.  Subjective Assessment - 12/22/17 1326    Subjective  Patient reports 4/10 pain. "Its always there."    Pertinent History  DM.    Limitations  Walking    How long can you walk comfortably?  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    Diagnostic tests  MRI in 2014 revealing degnerative changes at L4-5 and L5-S1 with bilateral forminal stenosis.    Patient Stated Goals  reduce back pain and get around better.    Currently in Pain?  Yes    Pain Score  4     Pain Location  Back    Pain Orientation  Lower    Pain Descriptors / Indicators  Discomfort    Pain Type  Chronic pain    Pain Onset  More than a month ago    Pain Frequency  Constant                      [facility] Adult PT Treatment/Exercise - 12/22/17 0001      Lumbar Exercises: Aerobic   Recumbent Bike  L4 x10 minutes      Lumbar Exercises: Standing   Wall Slides  20 reps    Shoulder Extension   Strengthening;Both;20 reps    Other Standing Lumbar Exercises  B chop wood pink XTS x20 reps      Lumbar Exercises: Supine   Bridge with Ball Squeeze  20 reps;2 seconds    Straight Leg Raise  20 reps;2 seconds      Lumbar Exercises: Sidelying   Hip Abduction  Both;20 reps;Weights #2      Modalities   Modalities  Electrical Stimulation      Moist Heat Therapy   Number Minutes Moist Heat  15 Minutes    Moist Heat Location  Lumbar Spine      Electrical Stimulation   Electrical Stimulation Location  B lumbar paraspinals    Electrical Stimulation Action  Pre-mod    Electrical Stimulation Parameters  80-150 Hz x15 min    Electrical Stimulation Goals  Pain                  PT Long Term Goals - 12/05/17 1152      PT LONG TERM GOAL #1  Title  Independent with a HEP.    Time  4    Period  Weeks    Status  On-going      PT LONG TERM GOAL #2   Title  Stand 20 minutes with pain not > 3/10.    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    Time  4    Period  Weeks    Status  On-going            Plan - 12/22/17 1345    Clinical Impression Statement  Patient demonstrated good carryover of VC and TC from last session. Patient reported increased difficulty with R hip abduction secondary to weakness but patient was able to complete all sets.    Clinical Presentation  Evolving    Clinical Decision Making  Low    Rehab Potential  Good    PT Frequency  3x / week    PT Duration  4 weeks    PT Treatment/Interventions  ADLs/Self Care Home Management;Cryotherapy;Electrical Stimulation;Ultrasound;Traction;Moist Heat;Therapeutic activities;Therapeutic exercise;Patient/family education;Manual techniques    PT Next Visit Plan  Continue core and functional strengthening with modalities per symptoms.    Consulted and Agree with Plan of Care  Patient       Patient will benefit from  skilled therapeutic intervention in order to improve the following deficits and impairments:  Decreased activity tolerance, Postural dysfunction, Pain, Decreased range of motion  Visit Diagnosis: Chronic midline low back pain, with sciatica presence unspecified     Problem List Patient Active Problem List   Diagnosis Date Noted  . 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 Essential hypertension 02/23/2017  . Benign prostatic hyperplasia with incomplete bladder emptying 02/23/2017  . Vitamin D deficiency 02/23/2017  . Hyperlipidemia associated with type 2 diabetes mellitus ([place]) 02/23/2017  . Type 2 diabetes mellitus with stage 2 chronic kidney disease, without long-term current use of insulin ([place]) 02/23/2017    PHYSICAL THERAPY DISCHARGE SUMMARY  Visits from Start of Care: 7  Current functional level related to goals / functional outcomes: See above   Remaining deficits: See goals   Education / Equipment: HEP Plan: Patient agrees to discharge.  Patient goals were not met. Patient is being discharged due to not returning since the last visit.  ?????      Gabriela Eves, PT, DPT 12/22/2017, 3:16 PM  [facility] [address] [place], [place], [address] Phone: [phone]   Fax:  [phone]  Name: [name] MRN: [id number] Date of Birth: 05/18/1956

## 2017-12-26 ENCOUNTER — Telehealth: Payer: Self-pay | Admitting: *Deleted

## 2017-12-26 DIAGNOSIS — M48061 Spinal stenosis, lumbar region without neurogenic claudication: Secondary | ICD-10-CM | POA: Diagnosis not present

## 2017-12-26 DIAGNOSIS — M4686 Other specified inflammatory spondylopathies, lumbar region: Secondary | ICD-10-CM | POA: Diagnosis not present

## 2017-12-26 MED ORDER — HYDROCODONE-HOMATROPINE 5-1.5 MG/5ML PO SYRP: 5.0000 mL | ORAL_SOLUTION | Freq: Four times a day (QID) | ORAL | 0 refills | Status: DC | PRN

## 2017-12-26 NOTE — Telephone Encounter (Signed)
Sent hycodan.

## 2017-12-26 NOTE — Telephone Encounter (Signed)
Patient has a really bad cough and wants to know if you can call him in something for the cough

## 2017-12-26 NOTE — Telephone Encounter (Signed)
Wife aware

## 2018-02-28 ENCOUNTER — Encounter: Payer: Self-pay | Admitting: Physician Assistant

## 2018-02-28 ENCOUNTER — Ambulatory Visit (INDEPENDENT_AMBULATORY_CARE_PROVIDER_SITE_OTHER): Payer: PPO | Admitting: Physician Assistant

## 2018-02-28 VITALS — BP 155/79 | HR 96 | Temp 98.9°F | Ht 71.0 in | Wt 222.0 lb

## 2018-02-28 DIAGNOSIS — I120 Hypertensive chronic kidney disease with stage 5 chronic kidney disease or end stage renal disease: Secondary | ICD-10-CM

## 2018-02-28 DIAGNOSIS — I10 Essential (primary) hypertension: Secondary | ICD-10-CM | POA: Diagnosis not present

## 2018-02-28 DIAGNOSIS — L989 Disorder of the skin and subcutaneous tissue, unspecified: Secondary | ICD-10-CM | POA: Diagnosis not present

## 2018-02-28 DIAGNOSIS — R809 Proteinuria, unspecified: Secondary | ICD-10-CM | POA: Diagnosis not present

## 2018-02-28 DIAGNOSIS — N182 Chronic kidney disease, stage 2 (mild): Secondary | ICD-10-CM

## 2018-02-28 DIAGNOSIS — M5136 Other intervertebral disc degeneration, lumbar region: Secondary | ICD-10-CM | POA: Insufficient documentation

## 2018-02-28 DIAGNOSIS — E559 Vitamin D deficiency, unspecified: Secondary | ICD-10-CM | POA: Diagnosis not present

## 2018-02-28 DIAGNOSIS — N185 Chronic kidney disease, stage 5: Secondary | ICD-10-CM | POA: Diagnosis not present

## 2018-02-28 DIAGNOSIS — D509 Iron deficiency anemia, unspecified: Secondary | ICD-10-CM | POA: Diagnosis not present

## 2018-02-28 DIAGNOSIS — E1122 Type 2 diabetes mellitus with diabetic chronic kidney disease: Secondary | ICD-10-CM

## 2018-02-28 DIAGNOSIS — Z79899 Other long term (current) drug therapy: Secondary | ICD-10-CM | POA: Diagnosis not present

## 2018-02-28 DIAGNOSIS — N183 Chronic kidney disease, stage 3 (moderate): Secondary | ICD-10-CM | POA: Diagnosis not present

## 2018-02-28 DIAGNOSIS — M51369 Other intervertebral disc degeneration, lumbar region without mention of lumbar back pain or lower extremity pain: Secondary | ICD-10-CM | POA: Insufficient documentation

## 2018-02-28 LAB — BAYER DCA HB A1C WAIVED: HB A1C (BAYER DCA - WAIVED): 7.5 % — ABNORMAL HIGH (ref <7.0)

## 2018-02-28 MED ORDER — METFORMIN HCL ER 500 MG PO TB24: 500.0000 mg | ORAL_TABLET | Freq: Every day | ORAL | 0 refills | Status: DC

## 2018-03-01 LAB — CBC WITH DIFFERENTIAL/PLATELET
BASOS: 1 %
Basophils Absolute: 0 10*3/uL (ref 0.0–0.2)
EOS (ABSOLUTE): 0.2 10*3/uL (ref 0.0–0.4)
EOS: 3 %
HEMATOCRIT: 46.4 % (ref 37.5–51.0)
HEMOGLOBIN: 15.8 g/dL (ref 13.0–17.7)
IMMATURE GRANS (ABS): 0 10*3/uL (ref 0.0–0.1)
Immature Granulocytes: 0 %
LYMPHS: 26 %
Lymphocytes Absolute: 1.6 10*3/uL (ref 0.7–3.1)
MCH: 32.4 pg (ref 26.6–33.0)
MCHC: 34.1 g/dL (ref 31.5–35.7)
MCV: 95 fL (ref 79–97)
MONOCYTES: 9 %
Monocytes Absolute: 0.5 10*3/uL (ref 0.1–0.9)
NEUTROS ABS: 3.9 10*3/uL (ref 1.4–7.0)
Neutrophils: 61 %
Platelets: 192 10*3/uL (ref 150–379)
RBC: 4.88 x10E6/uL (ref 4.14–5.80)
RDW: 13.5 % (ref 12.3–15.4)
WBC: 6.2 10*3/uL (ref 3.4–10.8)

## 2018-03-01 LAB — CMP14+EGFR
A/G RATIO: 1.8 (ref 1.2–2.2)
ALBUMIN: 4.4 g/dL (ref 3.6–4.8)
ALT: 23 IU/L (ref 0–44)
AST: 23 IU/L (ref 0–40)
Alkaline Phosphatase: 85 IU/L (ref 39–117)
BILIRUBIN TOTAL: 0.8 mg/dL (ref 0.0–1.2)
BUN / CREAT RATIO: 11 (ref 10–24)
BUN: 11 mg/dL (ref 8–27)
CALCIUM: 9.6 mg/dL (ref 8.6–10.2)
CHLORIDE: 99 mmol/L (ref 96–106)
CO2: 25 mmol/L (ref 20–29)
Creatinine, Ser: 0.99 mg/dL (ref 0.76–1.27)
GFR, EST AFRICAN AMERICAN: 93 mL/min/{1.73_m2} (ref >59)
GFR, EST NON AFRICAN AMERICAN: 80 mL/min/{1.73_m2} (ref >59)
Globulin, Total: 2.5 g/dL (ref 1.5–4.5)
Glucose: 222 mg/dL — ABNORMAL HIGH (ref 65–99)
POTASSIUM: 3.8 mmol/L (ref 3.5–5.2)
Sodium: 140 mmol/L (ref 134–144)
TOTAL PROTEIN: 6.9 g/dL (ref 6.0–8.5)

## 2018-03-01 LAB — LIPID PANEL
CHOL/HDL RATIO: 3.3 ratio (ref 0.0–5.0)
Cholesterol, Total: 166 mg/dL (ref 100–199)
HDL: 51 mg/dL (ref >39)
LDL Calculated: 85 mg/dL (ref 0–99)
Triglycerides: 149 mg/dL (ref 0–149)
VLDL CHOLESTEROL CAL: 30 mg/dL (ref 5–40)

## 2018-03-01 NOTE — Progress Notes (Signed)
BP (!) 155/79   Pulse 96   Temp 98.9 F (37.2 C) (Oral)   Ht _0  (1.803 m)   Wt 222 lb (100.7 kg)   BMI 30.96 kg/m    Subjective:    Patient ID: [name], male    DOB: 1956-08-15, 62 y.o.   MRN: [id number]  HPI: [name] is a 62 y.o. male presenting on 02/28/2018 for Follow-up (3 month )  Patient comes in for 23-monthrecheck on [place].  He is still having a lot of difficulties with his degenerative disc disease.  It is affecting his right leg more.  He will be having an injection on April 27.  They are looking at possible surgery this year.  He also has some areas on his ears and neck that are concerning for skin cancers.  He would like to be referred to dermatology.  All of his medications are reviewed today today and labs will be performed.  Past Medical History:  Diagnosis Date  . Chronic kidney disease   . Diabetes mellitus without complication ([place]   . Hypertension   . Proteinuria    Relevant past medical, surgical, family and social history reviewed and updated as indicated. Interim medical history since our last visit reviewed. Allergies and medications reviewed and updated. DATA REVIEWED: CHART IN EPIC  Family History reviewed for pertinent findings.  Review of Systems  Constitutional: Negative.  Negative for appetite change and fatigue.  Eyes: Negative for pain and visual disturbance.  Respiratory: Negative.  Negative for cough, chest tightness, shortness of breath and wheezing.   Cardiovascular: Negative.  Negative for chest pain, palpitations and leg swelling.  Gastrointestinal: Negative.  Negative for abdominal pain, diarrhea, nausea and vomiting.  Genitourinary: Negative.   Musculoskeletal: Positive for arthralgias, back pain and gait problem.  Skin: Negative.  Negative for color change and rash.  Neurological: Negative for weakness, numbness and headaches.  Psychiatric/Behavioral: Negative.     Allergies as of 02/28/2018   No Known  Allergies     Medication List        Accurate as of 02/28/18 11:59 PM. Always use your most recent med list.          amLODipine 10 MG tablet Commonly known as:  NORVASC Take 1 tablet (10 mg total) by mouth daily.   canagliflozin 100 MG Tabs tablet Commonly known as:  INVOKANA Take 1 tablet (100 mg total) by mouth daily before breakfast.   cloNIDine 0.2 MG tablet Commonly known as:  CATAPRES Take 1 tablet (0.2 mg total) by mouth 2 (two) times daily.   finasteride 5 MG tablet Commonly known as:  PROSCAR Take 1 tablet (5 mg total) by mouth daily.   furosemide 20 MG tablet Commonly known as:  LASIX Take 1 tablet (20 mg total) by mouth 2 (two) times daily. Take second dose not later than 4pm   HYDROcodone-homatropine 5-1.5 MG/5ML syrup Commonly known as:  HYCODAN Take 5-10 mLs by mouth every 6 (six) hours as needed.   irbesartan 300 MG tablet Commonly known as:  AVAPRO Take 1 tablet (300 mg total) by mouth daily.   metFORMIN 500 MG 24 hr tablet Commonly known as:  GLUCOPHAGE-XR Take 1 tablet (500 mg total) by mouth daily with breakfast.   mupirocin cream 2 % Commonly known as:  BACTROBAN Apply 1 application topically 2 (two) times daily.   potassium chloride SA 20 MEQ tablet Commonly known as:  K-DUR,KLOR-CON TAKE  (1)  TABLET  TWICE A DAY.   tamsulosin 0.4 MG Caps capsule Commonly known as:  FLOMAX Take 1 capsule (0.4 mg total) by mouth daily.   VITAMIN B 12 PO Take by mouth.   Vitamin D (Cholecalciferol) 1000 units Tabs Take 2 tablets by mouth daily.          Objective:    BP (!) 155/79   Pulse 96   Temp 98.9 F (37.2 C) (Oral)   Ht _0  (1.803 m)   Wt 222 lb (100.7 kg)   BMI 30.96 kg/m   No Known Allergies  Wt Readings from Last 3 Encounters:  02/28/18 222 lb (100.7 kg)  11/28/17 226 lb 3.2 oz (102.6 kg)  08/25/17 223 lb (101.2 kg)    Physical Exam  Constitutional: He appears well-developed and well-nourished. No distress.  HENT:   Head: Normocephalic and atraumatic.  Eyes: Pupils are equal, round, and reactive to light. Conjunctivae and EOM are normal.  Cardiovascular: Normal rate, regular rhythm and normal heart sounds.  Pulmonary/Chest: Effort normal and breath sounds normal. No respiratory distress.  Skin: Skin is warm and dry.  Psychiatric: He has a normal mood and affect. His behavior is normal.  Nursing note and vitals reviewed.   Results for orders placed or performed in visit on 02/28/18  CBC with Differential/Platelet  Result Value Ref Range   WBC 6.2 3.4 - 10.8 x10E3/uL   RBC 4.88 4.14 - 5.80 x10E6/uL   Hemoglobin 15.8 13.0 - 17.7 g/dL   Hematocrit 46.4 37.5 - 51.0 %   MCV 95 79 - 97 fL   MCH 32.4 26.6 - 33.0 pg   MCHC 34.1 31.5 - 35.7 g/dL   RDW 13.5 12.3 - 15.4 %   Platelets 192 150 - 379 x10E3/uL   Neutrophils 61 Not Estab. %   Lymphs 26 Not Estab. %   Monocytes 9 Not Estab. %   Eos 3 Not Estab. %   Basos 1 Not Estab. %   Neutrophils Absolute 3.9 1.4 - 7.0 x10E3/uL   Lymphocytes Absolute 1.6 0.7 - 3.1 x10E3/uL   Monocytes Absolute 0.5 0.1 - 0.9 x10E3/uL   EOS (ABSOLUTE) 0.2 0.0 - 0.4 x10E3/uL   Basophils Absolute 0.0 0.0 - 0.2 x10E3/uL   Immature Granulocytes 0 Not Estab. %   Immature Grans (Abs) 0.0 0.0 - 0.1 x10E3/uL  CMP14+EGFR  Result Value Ref Range   Glucose 222 (H) 65 - 99 mg/dL   BUN 11 8 - 27 mg/dL   Creatinine, Ser 0.99 0.76 - 1.27 mg/dL   GFR calc non Af Amer 80 >59 mL/min/1.73   GFR calc Af Amer 93 >59 mL/min/1.73   BUN/Creatinine Ratio 11 10 - 24   Sodium 140 134 - 144 mmol/L   Potassium 3.8 3.5 - 5.2 mmol/L   Chloride 99 96 - 106 mmol/L   CO2 25 20 - 29 mmol/L   Calcium 9.6 8.6 - 10.2 mg/dL   Total Protein 6.9 6.0 - 8.5 g/dL   Albumin 4.4 3.6 - 4.8 g/dL   Globulin, Total 2.5 1.5 - 4.5 g/dL   Albumin/Globulin Ratio 1.8 1.2 - 2.2   Bilirubin Total 0.8 0.0 - 1.2 mg/dL   Alkaline Phosphatase 85 39 - 117 IU/L   AST 23 0 - 40 IU/L   ALT 23 0 - 44 IU/L  Lipid panel   Result Value Ref Range   Cholesterol, Total 166 100 - 199 mg/dL   Triglycerides 149 0 - 149 mg/dL   HDL 51 >39  mg/dL   VLDL Cholesterol Cal 30 5 - 40 mg/dL   LDL Calculated 85 0 - 99 mg/dL   Chol/HDL Ratio 3.3 0.0 - 5.0 ratio  Bayer DCA Hb A1c Waived  Result Value Ref Range   Bayer DCA Hb A1c Waived 7.5 (H) <7.0 %      Assessment & Plan:   1. Essential hypertension - CBC with Differential/Platelet - CMP14+EGFR - Lipid panel - Bayer DCA Hb A1c Waived  2. Type 2 diabetes mellitus with stage 2 chronic kidney disease, without long-term current use of insulin (HCC) - metFORMIN (GLUCOPHAGE-XR) 500 MG 24 hr tablet; Take 1 tablet (500 mg total) by mouth daily with breakfast.  Dispense: 90 tablet; Refill: 0 - CBC with Differential/Platelet - CMP14+EGFR - Lipid panel - Bayer DCA Hb A1c Waived  3. Hypertensive kidney disease with CKD (chronic kidney disease) stage V ([place])  4. DDD (degenerative disc disease), lumbar  5. Skin lesion of face - Ambulatory referral to Dermatology   Continue all other maintenance medications as listed above.  Follow up plan: Return in about 3 months (around 05/30/2018).  Educational handout given for [place] PA-C [place] [address]  [place], [place] [address] [phone]   03/01/2018, 12:32 PM

## 2018-03-06 DIAGNOSIS — E1129 Type 2 diabetes mellitus with other diabetic kidney complication: Secondary | ICD-10-CM | POA: Diagnosis not present

## 2018-03-06 DIAGNOSIS — N182 Chronic kidney disease, stage 2 (mild): Secondary | ICD-10-CM | POA: Diagnosis not present

## 2018-03-06 DIAGNOSIS — E889 Metabolic disorder, unspecified: Secondary | ICD-10-CM | POA: Diagnosis not present

## 2018-03-06 DIAGNOSIS — E876 Hypokalemia: Secondary | ICD-10-CM | POA: Diagnosis not present

## 2018-03-06 DIAGNOSIS — M9080 Osteopathy in diseases classified elsewhere, unspecified site: Secondary | ICD-10-CM | POA: Diagnosis not present

## 2018-03-06 DIAGNOSIS — I10 Essential (primary) hypertension: Secondary | ICD-10-CM | POA: Diagnosis not present

## 2018-03-11 DIAGNOSIS — M5136 Other intervertebral disc degeneration, lumbar region: Secondary | ICD-10-CM | POA: Diagnosis not present

## 2018-03-29 ENCOUNTER — Other Ambulatory Visit: Payer: Self-pay | Admitting: Physician Assistant

## 2018-04-07 ENCOUNTER — Other Ambulatory Visit: Payer: Self-pay | Admitting: *Deleted

## 2018-04-07 DIAGNOSIS — M5136 Other intervertebral disc degeneration, lumbar region: Secondary | ICD-10-CM | POA: Diagnosis not present

## 2018-04-07 DIAGNOSIS — M48061 Spinal stenosis, lumbar region without neurogenic claudication: Secondary | ICD-10-CM | POA: Diagnosis not present

## 2018-04-07 MED ORDER — LORATADINE 10 MG PO TABS: 10.0000 mg | ORAL_TABLET | Freq: Every day | ORAL | 11 refills | Status: DC

## 2018-04-07 MED ORDER — AZITHROMYCIN 250 MG PO TABS: ORAL_TABLET | ORAL | 0 refills | Status: DC

## 2018-04-07 NOTE — Progress Notes (Signed)
Per [place] call in a zpak and claritin. Rx sent

## 2018-04-14 DIAGNOSIS — M545 Low back pain: Secondary | ICD-10-CM | POA: Diagnosis not present

## 2018-04-21 DIAGNOSIS — M5416 Radiculopathy, lumbar region: Secondary | ICD-10-CM | POA: Diagnosis not present

## 2018-05-08 ENCOUNTER — Ambulatory Visit (INDEPENDENT_AMBULATORY_CARE_PROVIDER_SITE_OTHER): Payer: PPO | Admitting: Physician Assistant

## 2018-05-08 ENCOUNTER — Encounter: Payer: Self-pay | Admitting: Physician Assistant

## 2018-05-08 DIAGNOSIS — I10 Essential (primary) hypertension: Secondary | ICD-10-CM | POA: Diagnosis not present

## 2018-05-08 MED ORDER — CLONIDINE HCL 0.3 MG PO TABS: 0.3000 mg | ORAL_TABLET | Freq: Two times a day (BID) | ORAL | 2 refills | Status: DC

## 2018-05-08 NOTE — Progress Notes (Signed)
BP (!) 162/82   Pulse 75   Temp 98.5 F (36.9 C) (Oral)   Ht _0  (1.803 m)   Wt 221 lb 12.8 oz (100.6 kg)   BMI 30.93 kg/m    Subjective:    Patient ID: [name], male    DOB: 01-03-1956, 62 y.o.   MRN: [id number]  HPI: [name] is a 62 y.o. male presenting on 05/08/2018 for Hypertension  This patient comes in due to uncontrolled hypertension.  His blood pressure at home have been running in the 160s over 80s.  When he went to have his injection performed on his back today it was 209 100.  He says that he was quite anxious and had waited for quite some time.  He has not taken his medications this morning.  They would not perform the injection due to his blood pressure and that is why he is here to have it worked on.  Past Medical History:  Diagnosis Date  . Chronic kidney disease   . Diabetes mellitus without complication ([place])   . Hypertension   . Proteinuria    Relevant past medical, surgical, family and social history reviewed and updated as indicated. Interim medical history since our last visit reviewed. Allergies and medications reviewed and updated. DATA REVIEWED: CHART IN EPIC  Family History reviewed for pertinent findings.  Review of Systems  Constitutional: Negative.  Negative for appetite change and fatigue.  HENT: Negative.   Eyes: Negative.  Negative for pain and visual disturbance.  Respiratory: Negative.  Negative for cough, chest tightness, shortness of breath and wheezing.   Cardiovascular: Negative.  Negative for chest pain, palpitations and leg swelling.  Gastrointestinal: Negative.  Negative for abdominal pain, diarrhea, nausea and vomiting.  Endocrine: Negative.   Genitourinary: Negative.   Musculoskeletal: Negative.   Skin: Negative.  Negative for color change and rash.  Neurological: Negative.  Negative for weakness, numbness and headaches.  Psychiatric/Behavioral: Negative.     Allergies as of 05/08/2018   No Known Allergies       Medication List        Accurate as of 05/08/18  5:54 PM. Always use your most recent med list.          amLODipine 10 MG tablet Commonly known as:  NORVASC Take 1 tablet (10 mg total) by mouth daily.   canagliflozin 100 MG Tabs tablet Commonly known as:  INVOKANA Take 1 tablet (100 mg total) by mouth daily before breakfast.   cloNIDine 0.3 MG tablet Commonly known as:  CATAPRES Take 1 tablet (0.3 mg total) by mouth 2 (two) times daily.   finasteride 5 MG tablet Commonly known as:  PROSCAR Take 1 tablet (5 mg total) by mouth daily.   furosemide 20 MG tablet Commonly known as:  LASIX Take 1 tablet (20 mg total) by mouth 2 (two) times daily. Take second dose not later than 4pm   HYDROcodone-homatropine 5-1.5 MG/5ML syrup Commonly known as:  HYCODAN Take 5-10 mLs by mouth every 6 (six) hours as needed.   irbesartan 300 MG tablet Commonly known as:  AVAPRO Take 1 tablet (300 mg total) by mouth daily.   loratadine 10 MG tablet Commonly known as:  CLARITIN Take 1 tablet (10 mg total) by mouth daily.   metFORMIN 500 MG 24 hr tablet Commonly known as:  GLUCOPHAGE-XR Take 1 tablet (500 mg total) by mouth daily with breakfast.   mupirocin cream 2 % Commonly known as:  BACTROBAN Apply 1  application topically 2 (two) times daily.   potassium chloride SA 20 MEQ tablet Commonly known as:  K-DUR,KLOR-CON TAKE  (1)  TABLET TWICE A DAY.   tamsulosin 0.4 MG Caps capsule Commonly known as:  FLOMAX Take 1 capsule (0.4 mg total) by mouth daily.   VITAMIN B 12 PO Take by mouth.   Vitamin D (Cholecalciferol) 1000 units Tabs Take 2 tablets by mouth daily.          Objective:    BP (!) 162/82   Pulse 75   Temp 98.5 F (36.9 C) (Oral)   Ht _0  (1.803 m)   Wt 221 lb 12.8 oz (100.6 kg)   BMI 30.93 kg/m   No Known Allergies  Wt Readings from Last 3 Encounters:  05/08/18 221 lb 12.8 oz (100.6 kg)  02/28/18 222 lb (100.7 kg)  11/28/17 226 lb 3.2 oz (102.6 kg)     Physical Exam  Constitutional: He appears well-developed and well-nourished. No distress.  HENT:  Head: Normocephalic and atraumatic.  Eyes: Pupils are equal, round, and reactive to light. Conjunctivae and EOM are normal.  Cardiovascular: Normal rate, regular rhythm and normal heart sounds.  Pulmonary/Chest: Effort normal and breath sounds normal. No respiratory distress.  Skin: Skin is warm and dry.  Psychiatric: He has a normal mood and affect. His behavior is normal.  Nursing note and vitals reviewed.   Results for orders placed or performed in visit on 02/28/18  CBC with Differential/Platelet  Result Value Ref Range   WBC 6.2 3.4 - 10.8 x10E3/uL   RBC 4.88 4.14 - 5.80 x10E6/uL   Hemoglobin 15.8 13.0 - 17.7 g/dL   Hematocrit 46.4 37.5 - 51.0 %   MCV 95 79 - 97 fL   MCH 32.4 26.6 - 33.0 pg   MCHC 34.1 31.5 - 35.7 g/dL   RDW 13.5 12.3 - 15.4 %   Platelets 192 150 - 379 x10E3/uL   Neutrophils 61 Not Estab. %   Lymphs 26 Not Estab. %   Monocytes 9 Not Estab. %   Eos 3 Not Estab. %   Basos 1 Not Estab. %   Neutrophils Absolute 3.9 1.4 - 7.0 x10E3/uL   Lymphocytes Absolute 1.6 0.7 - 3.1 x10E3/uL   Monocytes Absolute 0.5 0.1 - 0.9 x10E3/uL   EOS (ABSOLUTE) 0.2 0.0 - 0.4 x10E3/uL   Basophils Absolute 0.0 0.0 - 0.2 x10E3/uL   Immature Granulocytes 0 Not Estab. %   Immature Grans (Abs) 0.0 0.0 - 0.1 x10E3/uL  CMP14+EGFR  Result Value Ref Range   Glucose 222 (H) 65 - 99 mg/dL   BUN 11 8 - 27 mg/dL   Creatinine, Ser 0.99 0.76 - 1.27 mg/dL   GFR calc non Af Amer 80 >59 mL/min/1.73   GFR calc Af Amer 93 >59 mL/min/1.73   BUN/Creatinine Ratio 11 10 - 24   Sodium 140 134 - 144 mmol/L   Potassium 3.8 3.5 - 5.2 mmol/L   Chloride 99 96 - 106 mmol/L   CO2 25 20 - 29 mmol/L   Calcium 9.6 8.6 - 10.2 mg/dL   Total Protein 6.9 6.0 - 8.5 g/dL   Albumin 4.4 3.6 - 4.8 g/dL   Globulin, Total 2.5 1.5 - 4.5 g/dL   Albumin/Globulin Ratio 1.8 1.2 - 2.2   Bilirubin Total 0.8 0.0 - 1.2 mg/dL    Alkaline Phosphatase 85 39 - 117 IU/L   AST 23 0 - 40 IU/L   ALT 23 0 - 44 IU/L  Lipid  panel  Result Value Ref Range   Cholesterol, Total 166 100 - 199 mg/dL   Triglycerides 149 0 - 149 mg/dL   HDL 51 >39 mg/dL   VLDL Cholesterol Cal 30 5 - 40 mg/dL   LDL Calculated 85 0 - 99 mg/dL   Chol/HDL Ratio 3.3 0.0 - 5.0 ratio  Bayer DCA Hb A1c Waived  Result Value Ref Range   HB A1C (BAYER DCA - WAIVED) 7.5 (H) <7.0 %      Assessment & Plan:   1. Essential hypertension - cloNIDine (CATAPRES) 0.3 MG tablet; Take 1 tablet (0.3 mg total) by mouth 2 (two) times daily.  Dispense: 60 tablet; Refill: 2   Continue all other maintenance medications as listed above.  Follow up plan: Return in about 1 week (around 05/15/2018).  Educational handout given for [place] PA-C [facility] [address]  [place], [place] [address] [phone]   05/08/2018, 5:54 PM

## 2018-05-15 ENCOUNTER — Ambulatory Visit (INDEPENDENT_AMBULATORY_CARE_PROVIDER_SITE_OTHER): Payer: PPO | Admitting: Physician Assistant

## 2018-05-15 ENCOUNTER — Encounter: Payer: Self-pay | Admitting: Physician Assistant

## 2018-05-15 VITALS — BP 146/69 | HR 68 | Temp 98.3°F | Ht 71.0 in | Wt 220.0 lb

## 2018-05-15 DIAGNOSIS — I10 Essential (primary) hypertension: Secondary | ICD-10-CM | POA: Diagnosis not present

## 2018-05-15 NOTE — Progress Notes (Signed)
BP (!) 146/69   Pulse 68   Temp 98.3 F (36.8 C) (Oral)   Ht '5\' 11"'$  (1.803 m)   Wt 220 lb (99.8 kg)   BMI 30.68 kg/m    Subjective:    Patient ID: [name], male    DOB: 04-Jun-1956, 62 y.o.   MRN: [id number]  HPI: Asim Gersten is a 62 y.o. male presenting on 05/15/2018 for No chief complaint on file.  This patient comes in with recheck on his blood pressure.  It was very elevated when he was in a have an injection performed at his at his office.  We made some adjustments in his medication.  He has not taken his blood pressure medicine the morning of the procedure.  Since starting the changed medication of clonidine 0.3 mg twice daily he has had much improved readings.  He is tolerating the medication well.  When he goes for his injection the next time I have encouraged him to take his BP meds.  Past Medical History:  Diagnosis Date  . Chronic kidney disease   . Diabetes mellitus without complication ([place])   . Hypertension   . Proteinuria    Relevant past medical, surgical, family and social history reviewed and updated as indicated. Interim medical history since our last visit reviewed. Allergies and medications reviewed and updated. DATA REVIEWED: CHART IN EPIC  Family History reviewed for pertinent findings.  Review of Systems  Constitutional: Negative.  Negative for appetite change and fatigue.  Eyes: Negative for pain and visual disturbance.  Respiratory: Negative.  Negative for cough, chest tightness, shortness of breath and wheezing.   Cardiovascular: Negative.  Negative for chest pain, palpitations and leg swelling.  Gastrointestinal: Negative.  Negative for abdominal pain, diarrhea, nausea and vomiting.  Genitourinary: Negative.   Skin: Negative.  Negative for color change and rash.  Neurological: Negative.  Negative for weakness, numbness and headaches.  Psychiatric/Behavioral: Negative.     Allergies as of 05/15/2018   No Known Allergies     Medication  List        Accurate as of 05/15/18 11:20 AM. Always use your most recent med list.          amLODipine 10 MG tablet Commonly known as:  NORVASC Take 1 tablet (10 mg total) by mouth daily.   canagliflozin 100 MG Tabs tablet Commonly known as:  INVOKANA Take 1 tablet (100 mg total) by mouth daily before breakfast.   cloNIDine 0.3 MG tablet Commonly known as:  CATAPRES Take 1 tablet (0.3 mg total) by mouth 2 (two) times daily.   finasteride 5 MG tablet Commonly known as:  PROSCAR Take 1 tablet (5 mg total) by mouth daily.   furosemide 20 MG tablet Commonly known as:  LASIX Take 1 tablet (20 mg total) by mouth 2 (two) times daily. Take second dose not later than 4pm   HYDROcodone-homatropine 5-1.5 MG/5ML syrup Commonly known as:  HYCODAN Take 5-10 mLs by mouth every 6 (six) hours as needed.   irbesartan 300 MG tablet Commonly known as:  AVAPRO Take 1 tablet (300 mg total) by mouth daily.   loratadine 10 MG tablet Commonly known as:  CLARITIN Take 1 tablet (10 mg total) by mouth daily.   metFORMIN 500 MG 24 hr tablet Commonly known as:  GLUCOPHAGE-XR Take 1 tablet (500 mg total) by mouth daily with breakfast.   mupirocin cream 2 % Commonly known as:  BACTROBAN Apply 1 application topically 2 (two) times daily.  potassium chloride SA 20 MEQ tablet Commonly known as:  K-DUR,KLOR-CON TAKE  (1)  TABLET TWICE A DAY.   tamsulosin 0.4 MG Caps capsule Commonly known as:  FLOMAX Take 1 capsule (0.4 mg total) by mouth daily.   VITAMIN B 12 PO Take by mouth.   Vitamin D (Cholecalciferol) 1000 units Tabs Take 2 tablets by mouth daily.          Objective:    BP (!) 146/69   Pulse 68   Temp 98.3 F (36.8 C) (Oral)   Ht '5\' 11"'$  (1.803 m)   Wt 220 lb (99.8 kg)   BMI 30.68 kg/m   No Known Allergies  Wt Readings from Last 3 Encounters:  05/15/18 220 lb (99.8 kg)  05/08/18 221 lb 12.8 oz (100.6 kg)  02/28/18 222 lb (100.7 kg)    Physical Exam    Constitutional: He appears well-developed and well-nourished. No distress.  HENT:  Head: Normocephalic and atraumatic.  Eyes: Pupils are equal, round, and reactive to light. Conjunctivae and EOM are normal.  Cardiovascular: Normal rate, regular rhythm and normal heart sounds.  Pulmonary/Chest: Effort normal and breath sounds normal. No respiratory distress.  Skin: Skin is warm and dry.  Psychiatric: He has a normal mood and affect. His behavior is normal.  Nursing note and vitals reviewed.   Results for orders placed or performed in visit on 02/28/18  CBC with Differential/Platelet  Result Value Ref Range   WBC 6.2 3.4 - 10.8 x10E3/uL   RBC 4.88 4.14 - 5.80 x10E6/uL   Hemoglobin 15.8 13.0 - 17.7 g/dL   Hematocrit 46.4 37.5 - 51.0 %   MCV 95 79 - 97 fL   MCH 32.4 26.6 - 33.0 pg   MCHC 34.1 31.5 - 35.7 g/dL   RDW 13.5 12.3 - 15.4 %   Platelets 192 150 - 379 x10E3/uL   Neutrophils 61 Not Estab. %   Lymphs 26 Not Estab. %   Monocytes 9 Not Estab. %   Eos 3 Not Estab. %   Basos 1 Not Estab. %   Neutrophils Absolute 3.9 1.4 - 7.0 x10E3/uL   Lymphocytes Absolute 1.6 0.7 - 3.1 x10E3/uL   Monocytes Absolute 0.5 0.1 - 0.9 x10E3/uL   EOS (ABSOLUTE) 0.2 0.0 - 0.4 x10E3/uL   Basophils Absolute 0.0 0.0 - 0.2 x10E3/uL   Immature Granulocytes 0 Not Estab. %   Immature Grans (Abs) 0.0 0.0 - 0.1 x10E3/uL  CMP14+EGFR  Result Value Ref Range   Glucose 222 (H) 65 - 99 mg/dL   BUN 11 8 - 27 mg/dL   Creatinine, Ser 0.99 0.76 - 1.27 mg/dL   GFR calc non Af Amer 80 >59 mL/min/1.73   GFR calc Af Amer 93 >59 mL/min/1.73   BUN/Creatinine Ratio 11 10 - 24   Sodium 140 134 - 144 mmol/L   Potassium 3.8 3.5 - 5.2 mmol/L   Chloride 99 96 - 106 mmol/L   CO2 25 20 - 29 mmol/L   Calcium 9.6 8.6 - 10.2 mg/dL   Total Protein 6.9 6.0 - 8.5 g/dL   Albumin 4.4 3.6 - 4.8 g/dL   Globulin, Total 2.5 1.5 - 4.5 g/dL   Albumin/Globulin Ratio 1.8 1.2 - 2.2   Bilirubin Total 0.8 0.0 - 1.2 mg/dL   Alkaline  Phosphatase 85 39 - 117 IU/L   AST 23 0 - 40 IU/L   ALT 23 0 - 44 IU/L  Lipid panel  Result Value Ref Range   Cholesterol, Total 166  100 - 199 mg/dL   Triglycerides 149 0 - 149 mg/dL   HDL 51 >39 mg/dL   VLDL Cholesterol Cal 30 5 - 40 mg/dL   LDL Calculated 85 0 - 99 mg/dL   Chol/HDL Ratio 3.3 0.0 - 5.0 ratio  Bayer DCA Hb A1c Waived  Result Value Ref Range   HB A1C (BAYER DCA - WAIVED) 7.5 (H) <7.0 %      Assessment & Plan:   There are no diagnoses linked to this encounter.  Continue all other maintenance medications as listed above.  Follow up plan: 1. Essential hypertension Continue medications   Educational handout given for [place] PA-C [place] [facility] [address]  [place], [place] [address] [phone]   05/15/2018, 11:20 AM

## 2018-05-19 DIAGNOSIS — M47816 Spondylosis without myelopathy or radiculopathy, lumbar region: Secondary | ICD-10-CM | POA: Diagnosis not present

## 2018-05-19 DIAGNOSIS — M47817 Spondylosis without myelopathy or radiculopathy, lumbosacral region: Secondary | ICD-10-CM | POA: Diagnosis not present

## 2018-05-24 DIAGNOSIS — M5136 Other intervertebral disc degeneration, lumbar region: Secondary | ICD-10-CM | POA: Diagnosis not present

## 2018-05-29 ENCOUNTER — Other Ambulatory Visit: Payer: Self-pay | Admitting: Physician Assistant

## 2018-05-29 DIAGNOSIS — I10 Essential (primary) hypertension: Secondary | ICD-10-CM

## 2018-05-29 DIAGNOSIS — N401 Enlarged prostate with lower urinary tract symptoms: Secondary | ICD-10-CM

## 2018-05-29 DIAGNOSIS — N182 Chronic kidney disease, stage 2 (mild): Secondary | ICD-10-CM

## 2018-05-29 DIAGNOSIS — E1122 Type 2 diabetes mellitus with diabetic chronic kidney disease: Secondary | ICD-10-CM

## 2018-05-29 DIAGNOSIS — R3914 Feeling of incomplete bladder emptying: Principal | ICD-10-CM

## 2018-05-31 ENCOUNTER — Ambulatory Visit (INDEPENDENT_AMBULATORY_CARE_PROVIDER_SITE_OTHER): Payer: PPO | Admitting: Physician Assistant

## 2018-05-31 ENCOUNTER — Encounter: Payer: Self-pay | Admitting: Physician Assistant

## 2018-05-31 ENCOUNTER — Ambulatory Visit (INDEPENDENT_AMBULATORY_CARE_PROVIDER_SITE_OTHER): Payer: PPO

## 2018-05-31 VITALS — BP 151/76 | HR 74 | Ht 71.0 in | Wt 215.6 lb

## 2018-05-31 DIAGNOSIS — E1122 Type 2 diabetes mellitus with diabetic chronic kidney disease: Secondary | ICD-10-CM | POA: Diagnosis not present

## 2018-05-31 DIAGNOSIS — N182 Chronic kidney disease, stage 2 (mild): Secondary | ICD-10-CM

## 2018-05-31 DIAGNOSIS — I10 Essential (primary) hypertension: Secondary | ICD-10-CM

## 2018-05-31 DIAGNOSIS — Z01818 Encounter for other preprocedural examination: Secondary | ICD-10-CM | POA: Diagnosis not present

## 2018-05-31 MED ORDER — CANAGLIFLOZIN 100 MG PO TABS
ORAL_TABLET | ORAL | 3 refills | Status: DC
Start: 2018-05-31 — End: 2018-09-01

## 2018-05-31 MED ORDER — AMLODIPINE BESYLATE 10 MG PO TABS: 10.0000 mg | ORAL_TABLET | Freq: Every day | ORAL | 3 refills | Status: DC

## 2018-05-31 MED ORDER — SPIRONOLACTONE 25 MG PO TABS: 25.0000 mg | ORAL_TABLET | Freq: Every day | ORAL | 5 refills | Status: DC

## 2018-05-31 NOTE — Patient Instructions (Signed)
Hold furosemide and potassium New med: spironolactone

## 2018-05-31 NOTE — Progress Notes (Signed)
BP (!) 151/76   Pulse 74   Ht '5\' 11"'$  (1.803 m)   Wt 215 lb 9.6 oz (97.8 kg)   BMI 30.07 kg/m    Subjective:    Patient ID: [name], male    DOB: 1955/11/16, 62 y.o.   MRN: [id number]  HPI: [name] is a 62 y.o. male presenting on 05/31/2018 for Diabetes; Hypertension; and Hyperlipidemia  Patient comes in for recheck on his chronic medical condition and for preoperative clearance for his surgery.  He tried injections into his back but nothing was able to give him any relief.  They are looking at surgery now.  He had a chest x-ray which was normal today.  He had an EKG which was normal today.  His last labs were performed within the last 45 days and a copy will be sent to the surgeon.  His last A1c was 7.5 and fairly well controlled.  His blood pressure has been greatly improved.  All of his medications are reviewed and there are no issues at this time.  He has had very hard to treat hypertension suspect he has a secondary hypertension calls.  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  He has had chronically low potassiums and one time it was out of range low.  He has been taking Lasix with potassium supplementation.  I like for him to stop this and we will start Spironolactone, a potassium sparing diuretic.  We will see if this improves his blood pressure and his need for as many blood pressure medications.  We will plan to see him back in the next 2 to 3 months.  Past Medical History:  Diagnosis Date  . Chronic kidney disease   . Diabetes mellitus without complication ([place])   . Hypertension   . Proteinuria    Relevant past medical, surgical, family and social history reviewed and updated as indicated. Interim medical history since our last visit reviewed. Allergies and medications reviewed and updated. DATA REVIEWED: CHART IN EPIC  Family History reviewed for pertinent findings.  Review of Systems  Constitutional: Negative.  Negative for appetite change and fatigue.  HENT:  Negative.   Eyes: Negative.  Negative for pain and visual disturbance.  Respiratory: Negative.  Negative for cough, chest tightness, shortness of breath and wheezing.   Cardiovascular: Negative.  Negative for chest pain, palpitations and leg swelling.  Gastrointestinal: Negative.  Negative for abdominal pain, diarrhea, nausea and vomiting.  Endocrine: Negative.   Genitourinary: Negative.   Musculoskeletal: Positive for arthralgias, back pain and gait problem.  Skin: Negative.  Negative for color change and rash.  Neurological: Negative for weakness, numbness and headaches.  Psychiatric/Behavioral: Negative.     Allergies as of 05/31/2018   No Known Allergies     Medication List        Accurate as of 05/31/18  5:10 PM. Always use your most recent med list.          amLODipine 10 MG tablet Commonly known as:  NORVASC Take 1 tablet (10 mg total) by mouth daily.   canagliflozin 100 MG Tabs tablet Commonly known as:  INVOKANA TAKE (1) TABLET DAILY BE- FORE BREAKFAST.   cloNIDine 0.3 MG tablet Commonly known as:  CATAPRES Take 1 tablet (0.3 mg total) by mouth 2 (two) times daily.   finasteride 5 MG tablet Commonly known as:  PROSCAR Take 1 tablet (5 mg total) by mouth daily.   HYDROcodone-homatropine 5-1.5 MG/5ML syrup Commonly known as:  HYCODAN Take  5-10 mLs by mouth every 6 (six) hours as needed.   loratadine 10 MG tablet Commonly known as:  CLARITIN Take 1 tablet (10 mg total) by mouth daily.   metFORMIN 500 MG 24 hr tablet Commonly known as:  GLUCOPHAGE-XR Take 1 tablet (500 mg total) by mouth daily with breakfast.   mupirocin cream 2 % Commonly known as:  BACTROBAN Apply 1 application topically 2 (two) times daily.   spironolactone 25 MG tablet Commonly known as:  ALDACTONE Take 1 tablet (25 mg total) by mouth daily.   tamsulosin 0.4 MG Caps capsule Commonly known as:  FLOMAX TAKE 1 CAPSULE ONCE DAILY   VITAMIN B 12 PO Take by mouth.   Vitamin D  (Cholecalciferol) 1000 units Tabs Take 2 tablets by mouth daily.          Objective:    BP (!) 151/76   Pulse 74   Ht '5\' 11"'$  (1.803 m)   Wt 215 lb 9.6 oz (97.8 kg)   BMI 30.07 kg/m   No Known Allergies  Wt Readings from Last 3 Encounters:  05/31/18 215 lb 9.6 oz (97.8 kg)  05/15/18 220 lb (99.8 kg)  05/08/18 221 lb 12.8 oz (100.6 kg)    Physical Exam  Constitutional: He appears well-developed and well-nourished.  HENT:  Head: Normocephalic and atraumatic.  Eyes: Pupils are equal, round, and reactive to light. Conjunctivae and EOM are normal.  Neck: Normal range of motion. Neck supple.  Cardiovascular: Normal rate, regular rhythm and normal heart sounds.  Pulmonary/Chest: Effort normal and breath sounds normal.  Abdominal: Soft. Bowel sounds are normal.  Musculoskeletal:       Lumbar back: He exhibits decreased range of motion, tenderness, pain and spasm.  Skin: Skin is warm and dry.    Results for orders placed or performed in visit on 02/28/18  CBC with Differential/Platelet  Result Value Ref Range   WBC 6.2 3.4 - 10.8 x10E3/uL   RBC 4.88 4.14 - 5.80 x10E6/uL   Hemoglobin 15.8 13.0 - 17.7 g/dL   Hematocrit 46.4 37.5 - 51.0 %   MCV 95 79 - 97 fL   MCH 32.4 26.6 - 33.0 pg   MCHC 34.1 31.5 - 35.7 g/dL   RDW 13.5 12.3 - 15.4 %   Platelets 192 150 - 379 x10E3/uL   Neutrophils 61 Not Estab. %   Lymphs 26 Not Estab. %   Monocytes 9 Not Estab. %   Eos 3 Not Estab. %   Basos 1 Not Estab. %   Neutrophils Absolute 3.9 1.4 - 7.0 x10E3/uL   Lymphocytes Absolute 1.6 0.7 - 3.1 x10E3/uL   Monocytes Absolute 0.5 0.1 - 0.9 x10E3/uL   EOS (ABSOLUTE) 0.2 0.0 - 0.4 x10E3/uL   Basophils Absolute 0.0 0.0 - 0.2 x10E3/uL   Immature Granulocytes 0 Not Estab. %   Immature Grans (Abs) 0.0 0.0 - 0.1 x10E3/uL  CMP14+EGFR  Result Value Ref Range   Glucose 222 (H) 65 - 99 mg/dL   BUN 11 8 - 27 mg/dL   Creatinine, Ser 0.99 0.76 - 1.27 mg/dL   GFR calc non Af Amer 80 >59 mL/min/1.73     GFR calc Af Amer 93 >59 mL/min/1.73   BUN/Creatinine Ratio 11 10 - 24   Sodium 140 134 - 144 mmol/L   Potassium 3.8 3.5 - 5.2 mmol/L   Chloride 99 96 - 106 mmol/L   CO2 25 20 - 29 mmol/L   Calcium 9.6 8.6 - 10.2 mg/dL   Total  Protein 6.9 6.0 - 8.5 g/dL   Albumin 4.4 3.6 - 4.8 g/dL   Globulin, Total 2.5 1.5 - 4.5 g/dL   Albumin/Globulin Ratio 1.8 1.2 - 2.2   Bilirubin Total 0.8 0.0 - 1.2 mg/dL   Alkaline Phosphatase 85 39 - 117 IU/L   AST 23 0 - 40 IU/L   ALT 23 0 - 44 IU/L  Lipid panel  Result Value Ref Range   Cholesterol, Total 166 100 - 199 mg/dL   Triglycerides 149 0 - 149 mg/dL   HDL 51 >39 mg/dL   VLDL Cholesterol Cal 30 5 - 40 mg/dL   LDL Calculated 85 0 - 99 mg/dL   Chol/HDL Ratio 3.3 0.0 - 5.0 ratio  Bayer DCA Hb A1c Waived  Result Value Ref Range   HB A1C (BAYER DCA - WAIVED) 7.5 (H) <7.0 %      Assessment & Plan:   1. Essential hypertension - EKG 12-Lead - DG Chest 2 View; Future - amLODipine (NORVASC) 10 MG tablet; Take 1 tablet (10 mg total) by mouth daily.  Dispense: 90 tablet; Refill: 3  2. Type 2 diabetes mellitus with stage 2 chronic kidney disease, without long-term current use of insulin (HCC) - canagliflozin (INVOKANA) 100 MG TABS tablet; TAKE (1) TABLET DAILY BE- FORE BREAKFAST.  Dispense: 90 tablet; Refill: 3  3. Pre-op evaluation - EKG 12-Lead - DG Chest 2 View; Future    Continue all other maintenance medications as listed above.  Follow up plan: No follow-ups on file.  Educational handout given for [place] PA-C [facility] [address]  [place], [place] [address] [phone]   05/31/2018, 5:10 PM

## 2018-06-13 ENCOUNTER — Ambulatory Visit (INDEPENDENT_AMBULATORY_CARE_PROVIDER_SITE_OTHER): Payer: PPO

## 2018-06-13 VITALS — BP 137/75 | HR 66 | Temp 98.2°F | Ht 71.0 in | Wt 216.0 lb

## 2018-06-13 DIAGNOSIS — Z0000 Encounter for general adult medical examination without abnormal findings: Secondary | ICD-10-CM | POA: Diagnosis not present

## 2018-06-13 NOTE — Progress Notes (Addendum)
Subjective:   Devarius Nelles is a 62 y.o. male who presents for Medicare Annual/Subsequent preventive examination.  Mr. Garringer is here today accompanied by his wife, Mardene Celeste.  He worked many years with [employer] and has raised and sold beef cows for years.  He continues to do this today.  He has two sons, one daughter, and four grandchildren.    Review of Systems:   Cardiac Risk Factors include: advanced age (>56men, >52 women);diabetes mellitus;dyslipidemia;hypertension;male gender;obesity (BMI >30kg/m2)     Objective:    Vitals: BP 137/75   Pulse 66   Temp 98.2 F (36.8 C) (Oral)   Ht 5\' 11"  (1.803 m)   Wt 216 lb (98 kg)   BMI 30.13 kg/m   Body mass index is 30.13 kg/m.  Advanced Directives 11/30/2017 04/14/2017  Does Patient Have a Medical Advance Directive? No No  Would patient like information on creating a medical advance directive? - Yes (MAU/Ambulatory/Procedural Areas - Information given)    Tobacco Social History   Tobacco Use  Smoking Status Never Smoker  Smokeless Tobacco Never Used    Clinical Intake   Past Medical History:  Diagnosis Date  . Chronic kidney disease   . Diabetes mellitus without complication ([place])   . Hypertension   . Proteinuria    Past Surgical History:  Procedure Laterality Date  . BELPHAROPTOSIS REPAIR    . CARDIAC CATHETERIZATION    . EYE SURGERY    . HEMORRHOID SURGERY     Family History  Problem Relation Age of Onset  . Heart attack Father   . Hypertension Father   . Vision loss Sister        one eye  . Cancer Brother        skin  . Diabetes Brother    Social History   Socioeconomic History  . Marital status: Married    Spouse name: Not on file  . Number of children: Not on file  . Years of education: Not on file  . Highest education level: Not on file  Occupational History  . Not on file  Social Needs  . Financial resource strain: Not on file  . Food insecurity:    Worry: Not on file    Inability:  Not on file  . Transportation needs:    Medical: Not on file    Non-medical: Not on file  Tobacco Use  . Smoking status: Never Smoker  . Smokeless tobacco: Never Used  Substance and Sexual Activity  . Alcohol use: Yes    Alcohol/week: 4.8 oz    Types: 8 Cans of beer per week  . Drug use: No  . Sexual activity: Yes  Lifestyle  . Physical activity:    Days per week: Not on file    Minutes per session: Not on file  . Stress: Not on file  Relationships  . Social connections:    Talks on phone: Not on file    Gets together: Not on file    Attends religious service: Not on file    Active member of club or organization: Not on file    Attends meetings of clubs or organizations: Not on file    Relationship status: Not on file  Other Topics Concern  . Not on file  Social History Narrative  . Not on file    Outpatient Encounter Medications as of 06/13/2018  Medication Sig  . amLODipine (NORVASC) 10 MG tablet Take 1 tablet (10 mg total)  by mouth daily.  . canagliflozin (INVOKANA) 100 MG TABS tablet TAKE (1) TABLET DAILY BE- FORE BREAKFAST.  . cloNIDine (CATAPRES) 0.3 MG tablet Take 1 tablet (0.3 mg total) by mouth 2 (two) times daily.  . Cyanocobalamin (VITAMIN B 12 PO) Take by mouth.  . finasteride (PROSCAR) 5 MG tablet Take 1 tablet (5 mg total) by mouth daily.  Marland Kitchen HYDROcodone-homatropine (HYCODAN) 5-1.5 MG/5ML syrup Take 5-10 mLs by mouth every 6 (six) hours as needed.  . loratadine (CLARITIN) 10 MG tablet Take 1 tablet (10 mg total) by mouth daily.  . metFORMIN (GLUCOPHAGE-XR) 500 MG 24 hr tablet Take 1 tablet (500 mg total) by mouth daily with breakfast.  . mupirocin cream (BACTROBAN) 2 % Apply 1 application topically 2 (two) times daily.  Marland Kitchen spironolactone (ALDACTONE) 25 MG tablet Take 1 tablet (25 mg total) by mouth daily.  . tamsulosin (FLOMAX) 0.4 MG CAPS capsule TAKE 1 CAPSULE ONCE DAILY  . Vitamin D, Cholecalciferol, 1000 units TABS Take 2 tablets by mouth daily.   No  facility-administered encounter medications on file as of 06/13/2018.     Activities of Daily Living In your present state of health, do you have any difficulty performing the following activities: 06/13/2018  Hearing? N  Vision? N  Difficulty concentrating or making decisions? N  Walking or climbing stairs? N  Dressing or bathing? N  Doing errands, shopping? N  Preparing Food and eating ? N  Using the Toilet? N  In the past six months, have you accidently leaked urine? N  Do you have problems with loss of bowel control? N  Managing your Medications? N  Managing your Finances? N  Housekeeping or managing your Housekeeping? N  Some recent data might be hidden    Patient Care Team: Theodoro Clock as PCP - General (Physician Assistant) Allyn Kenner, MD as Consulting Physician (Dermatology) Liana Gerold, MD as Consulting Physician (Nephrology)     Assessment:   This is a routine wellness examination for Doug.  Exercise Activities and Dietary recommendations Current Exercise Habits: The patient has a physically strenous job, but has no regular exercise apart from work., Exercise limited by: orthopedic condition(s)  Goals    . DIET - EAT MORE FRUITS AND VEGETABLES       Fall Risk Fall Risk  06/13/2018 05/31/2018 08/25/2017 05/25/2017 04/14/2017  Falls in the past year? No No No No No   Is the patient's home free of loose throw rugs in walkways, pet beds, electrical cords, etc?   Yes      Grab bars in the bathroom? No      Handrails on the stairs?   Yes      Adequate lighting?   Yes    Terald Sleeper PA-C [facility] [address]  [place], [place] [address] [phone]    Depression Screen [facility] 2/9 Scores 06/13/2018 05/31/2018 05/15/2018 05/08/2018  PHQ - 2 Score 1 3 3 3   PHQ- 9 Score - 6 6 6     Cognitive Function MMSE - Mini Mental State Exam 06/13/2018 04/14/2017  Not completed: - Unable to complete  Orientation to time 5 5  Orientation  to Place 5 5  Registration 3 3  Attention/ Calculation 5 0  Recall 2 3  Language- name 2 objects 2 2  Language- repeat 1 1  Language- follow 3 step command 3 3  Language- read & follow direction 1 0  Write a sentence 0 0  Copy design  1 0  Total score 28 22    Patient performed well on his MMSE, scoring 28 out of 30 available points.    Immunization History  Administered Date(s) Administered  . Influenza,inj,Quad PF,6+ Mos 08/25/2017  . Pneumococcal Polysaccharide-23 04/14/2017  Patient's insurance, [employer], requires that he have vaccines at his pharmacy.   Screening Tests Health Maintenance  Topic Date Due  . Hepatitis C Screening  1956-05-18  . FOOT EXAM  01/12/1964  . HIV Screening  01/11/1969  . TETANUS/TDAP  01/11/1973  . COLONOSCOPY  01/12/2004  . OPHTHALMOLOGY EXAM  07/24/2016  . URINE MICROALBUMIN  02/23/2018  . INFLUENZA VACCINE  06/15/2018  . HEMOGLOBIN A1C  08/30/2018   Cancer Screenings: Lung: Low Dose CT Chest recommended if Age 67-80 years, 30 pack-year currently smoking OR have quit w/in 15years. Patient does not qualify Colorectal: Patient refuses colonoscopy, given information on ColoGuard and will discuss with PCP at next visit.      Plan:     Discuss Cologuard with PCP at next visit Shingrix vaccine at pharmacy  I have personally reviewed and noted the following in the patient's chart:   . Medical and social history . Use of alcohol, tobacco or illicit drugs  . Current medications and supplements . Functional ability and status . Nutritional status . Physical activity . Advanced directives . List of other physicians . Hospitalizations, surgeries, and ER visits in previous 12 months . Vitals . Screenings to include cognitive, depression, and falls . Referrals and appointments  In addition, I have reviewed and discussed with patient certain preventive protocols, quality metrics, and best practice recommendations. A written  personalized care plan for preventive services as well as general preventive health recommendations were provided to patient.     Burnadette Pop, LPN  6/94/8546 I have reviewed and agree with the above AWV documentation.  I have reviewed and agree with the above AWV documentation.   Terald Sleeper PA-C [facility] [address]  [place], [place] [address] [phone]

## 2018-06-13 NOTE — Patient Instructions (Signed)
  [name] , Thank you for taking time to come for your Medicare Wellness Visit. I appreciate your ongoing commitment to your health goals. Please review the following plan we discussed and let me know if I can assist you in the future.   These are the goals we discussed: Goals    . DIET - EAT MORE FRUITS AND VEGETABLES       This is a list of the screening recommended for you and due dates:  Health Maintenance  Topic Date Due  .  Hepatitis C: One time screening is recommended by Center for Disease Control  (CDC) for  adults born from [birth year] through 1967.   [birth year]  . Complete foot exam   01/12/1964  . HIV Screening  01/11/1969  . Tetanus Vaccine  01/11/1973  . Colon Cancer Screening  01/12/2004  . Eye exam for diabetics  07/24/2016  . Urine Protein Check  02/23/2018  . Flu Shot  06/15/2018  . Hemoglobin A1C  08/30/2018

## 2018-06-28 ENCOUNTER — Other Ambulatory Visit: Payer: Self-pay

## 2018-06-28 ENCOUNTER — Encounter (HOSPITAL_COMMUNITY)
Admission: RE | Admit: 2018-06-28 | Discharge: 2018-06-28 | Disposition: A | Discharge status: Home / Self Care | Payer: PPO | Source: Ambulatory Visit | Attending: Orthopedic Surgery | Admitting: Orthopedic Surgery

## 2018-06-28 ENCOUNTER — Encounter (HOSPITAL_COMMUNITY): Payer: Self-pay

## 2018-06-28 DIAGNOSIS — Z01812 Encounter for preprocedural laboratory examination: Secondary | ICD-10-CM | POA: Insufficient documentation

## 2018-06-28 DIAGNOSIS — E119 Type 2 diabetes mellitus without complications: Secondary | ICD-10-CM | POA: Insufficient documentation

## 2018-06-28 HISTORY — DX: Spinal stenosis, lumbar region without neurogenic claudication: M48.061

## 2018-06-28 HISTORY — DX: Spondylosis without myelopathy or radiculopathy, lumbar region: M47.816

## 2018-06-28 HISTORY — DX: Adverse effect of unspecified anesthetic, initial encounter: T41.45XA

## 2018-06-28 HISTORY — DX: Other complications of anesthesia, initial encounter: T88.59XA

## 2018-06-28 HISTORY — DX: Radiculopathy, site unspecified: M54.10

## 2018-06-28 LAB — CBC
HEMATOCRIT: 41.2 % (ref 39.0–52.0)
Hemoglobin: 14.6 g/dL (ref 13.0–17.0)
MCH: 33 pg (ref 26.0–34.0)
MCHC: 35.4 g/dL (ref 30.0–36.0)
MCV: 93 fL (ref 78.0–100.0)
Platelets: 174 10*3/uL (ref 150–400)
RBC: 4.43 MIL/uL (ref 4.22–5.81)
RDW: 12.3 % (ref 11.5–15.5)
WBC: 7.5 10*3/uL (ref 4.0–10.5)

## 2018-06-28 LAB — COMPREHENSIVE METABOLIC PANEL
ALBUMIN: 4.2 g/dL (ref 3.5–5.0)
ALT: 30 U/L (ref 0–44)
AST: 22 U/L (ref 15–41)
Alkaline Phosphatase: 93 U/L (ref 38–126)
Anion gap: 10 (ref 5–15)
BILIRUBIN TOTAL: 1.3 mg/dL — AB (ref 0.3–1.2)
BUN: 15 mg/dL (ref 8–23)
CO2: 26 mmol/L (ref 22–32)
Calcium: 9.7 mg/dL (ref 8.9–10.3)
Chloride: 102 mmol/L (ref 98–111)
Creatinine, Ser: 1.14 mg/dL (ref 0.61–1.24)
GFR calc Af Amer: >60 mL/min (ref >60)
GFR calc non Af Amer: >60 mL/min (ref >60)
GLUCOSE: 317 mg/dL — AB (ref 70–99)
POTASSIUM: 4.6 mmol/L (ref 3.5–5.1)
Sodium: 138 mmol/L (ref 135–145)
TOTAL PROTEIN: 7.1 g/dL (ref 6.5–8.1)

## 2018-06-28 LAB — HEMOGLOBIN A1C
HEMOGLOBIN A1C: 7.9 % — AB (ref 4.8–5.6)
Mean Plasma Glucose: 180.03 mg/dL

## 2018-06-28 LAB — SURGICAL PCR SCREEN
MRSA, PCR: NEGATIVE
Staphylococcus aureus: NEGATIVE

## 2018-06-28 LAB — TYPE AND SCREEN
ABO/RH(D): O POS
ANTIBODY SCREEN: NEGATIVE

## 2018-06-28 LAB — ABO/RH: ABO/RH(D): O POS

## 2018-06-28 LAB — GLUCOSE, CAPILLARY: Glucose-Capillary: 310 mg/dL — ABNORMAL HIGH (ref 70–99)

## 2018-06-28 NOTE — Progress Notes (Signed)
PCP - PA Particia Nearing  Cardiologist - Denies  Chest x-ray - 05/31/18 (E)  EKG - 05/31/18 (E)  Stress Test - Denies  ECHO - Denies  Cardiac Cath - yrs ago- neg  Sleep Study - Yes- Positive CPAP - Yes- Told to bring mask dos  LABS- 06/28/18: CBC, CMP, T/S, PCR  ASA- Denies  HA1C- 06/28/18 Fasting Blood Sugar - >200, Today 310 Checks Blood Sugar __1___ time a day   Pt sts his bs have been over 200, but only in the 300's if he eats something bad the night before, in which he did. He sts he took his medicine this morning,a nd has not eaten yet today. Pt made aware that his surgery can be cancelled due to an abnormal bs. Pt advised to make better food choices, and not to eat so late at night.  Anesthesia- Yes- surgical order  Pt denies having chest pain, sob, or fever at this time. All instructions explained to the pt, with a verbal understanding of the material. Pt agrees to go over the instructions while at home for a better understanding. The opportunity to ask questions was provided.

## 2018-06-28 NOTE — Pre-Procedure Instructions (Signed)
[name]  06/28/2018      [facility] - [place], [place] - [address] [place] [address] Phone: [phone] Fax: [phone]    Your procedure is scheduled on Aug. 22, 2019 from 9:14AM-2:44PM  Report to [facility] Admitting Entrance "A" at 7:00AM  Call this number if you have problems the morning of surgery:  [phone]   Remember:  Do not eat or drink after midnight on Aug. 21st    Take these medicines the morning of surgery with A SIP OF WATER: AmLODipine (NORVASC), CloNIDine (CATAPRES), Finasteride (PROSCAR), and  Tamsulosin (FLOMAX)  7 days before surgery (8/15), stop taking all Other Aspirin Products, Vitamins, Fish oils, and Herbal medications. Also stop all NSAIDS i.e. Advil, Ibuprofen, Motrin, Aleve, Anaprox, Naproxen, BC, Goody Powders, and all Supplements.  How to Manage Your Diabetes Before and After Surgery  Why is it important to control my blood sugar before and after surgery? . Improving blood sugar levels before and after surgery helps healing and can limit problems. . A way of improving blood sugar control is eating a healthy diet by: o  Eating less sugar and carbohydrates o  Increasing activity/exercise o  Talking with your doctor about reaching your blood sugar goals . High blood sugars (greater than 180 mg/dL) can raise your risk of infections and slow your recovery, so you will need to focus on controlling your diabetes during the weeks before surgery. . Make sure that the doctor who takes care of your diabetes knows about your planned surgery including the date and location.  How do I manage my blood sugar before surgery? . Check your blood sugar at least 4 times a day, starting 2 days before surgery, to make sure that the level is not too high or low. o Check your blood sugar the morning of your surgery when you wake up and every 2 hours until you get to the Short Stay unit. . If your blood  sugar is less than 70 mg/dL, you will need to treat for low blood sugar: o Do not take insulin. o Treat a low blood sugar (less than 70 mg/dL) with  cup of clear juice (cranberry or apple), 4 glucose tablets, OR glucose gel. Recheck blood sugar in 15 minutes after treatment (to make sure it is greater than 70 mg/dL). If your blood sugar is not greater than 70 mg/dL on recheck, call [phone] o  for further instructions. . If your CBG is greater than 220 mg/dL, inform the staff upon arrival to Short Stay.  . If you are admitted to the hospital after surgery: o Your blood sugar will be checked by the staff and you will probably be given insulin after surgery (instead of oral diabetes medicines) to make sure you have good blood sugar levels. o The goal for blood sugar control after surgery is 80-180 mg/dL.  WHAT DO I DO ABOUT MY DIABETES MEDICATION?  Marland Kitchen Do not take Canagliflozin (INVOKANA) and MetFORMIN (GLUCOPHAGE-XR) the morning of surgery.  Reviewed and Endorsed by [facility] Patient Education Committee, August 2015    Do not wear jewelry.  Do not wear lotions, powders, colognes, or deodorant.  Do not shave 48 hours prior to surgery.  Men may shave face.  Do not bring valuables to the hospital.  [facility] is not responsible for any belongings or valuables.  Contacts, dentures or bridgework may not be worn into surgery.  Leave your suitcase in  the car.  After surgery it may be brought to your room.  For patients admitted to the hospital, discharge time will be determined by your treatment team.  Patients discharged the day of surgery will not be allowed to drive home.   Special instructions:  [facility]- Preparing For Surgery  Before surgery, you can play an important role. Because skin is not sterile, your skin needs to be as free of germs as possible. You can reduce the number of germs on your skin by washing with CHG (chlorahexidine gluconate) Soap before surgery.  CHG is an  antiseptic cleaner which kills germs and bonds with the skin to continue killing germs even after washing.    Oral Hygiene is also important to reduce your risk of infection.  Remember - BRUSH YOUR TEETH THE MORNING OF SURGERY WITH YOUR REGULAR TOOTHPASTE  Please do not use if you have an allergy to CHG or antibacterial soaps. If your skin becomes reddened/irritated stop using the CHG.  Do not shave (including legs and underarms) for at least 48 hours prior to first CHG shower. It is OK to shave your face.  Please follow these instructions carefully.   1. Shower the NIGHT BEFORE SURGERY and the MORNING OF SURGERY with CHG.   2. If you chose to wash your hair, wash your hair first as usual with your normal shampoo.  3. After you shampoo, rinse your hair and body thoroughly to remove the shampoo.  4. Use CHG as you would any other liquid soap. You can apply CHG directly to the skin and wash gently with a scrungie or a clean washcloth.   5. Apply the CHG Soap to your body ONLY FROM THE NECK DOWN.  Do not use on open wounds or open sores. Avoid contact with your eyes, ears, mouth and genitals (private parts). Wash Face and genitals (private parts)  with your normal soap.  6. Wash thoroughly, paying special attention to the area where your surgery will be performed.  7. Thoroughly rinse your body with warm water from the neck down.  8. DO NOT shower/wash with your normal soap after using and rinsing off the CHG Soap.  9. Pat yourself dry with a CLEAN TOWEL.  10. Wear CLEAN PAJAMAS to bed the night before surgery, wear comfortable clothes the morning of surgery  11. Place CLEAN SHEETS on your bed the night of your first shower and DO NOT SLEEP WITH PETS.  Day of Surgery:  Do not apply any deodorants/lotions.  Please wear clean clothes to the hospital/surgery center.   Remember to brush your teeth WITH YOUR REGULAR TOOTHPASTE.  Please read over the following fact sheets that you were  given. Pain Booklet, Coughing and Deep Breathing, MRSA Information and Surgical Site Infection Prevention

## 2018-06-29 NOTE — Progress Notes (Signed)
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 Pertinent hx includes HTN, DMII, CKD, OSA on CPAP.  Pt was previously seen by [facility] in 2012 for eval of substernal chest pain. He underwent cath showing minimal nonobstructive CAD. He was subsequently treated for HTN, GERD, and OSA.  Pt saw PCP for clearance 05/31/2018, note in Epic. History of poor blood glucose control. Last A1c 7.9 on 06/28/2018, but his CBG was 310 and pt reports BG often 200-300.  He was advised that if BG markedly elevated on DOS could be cause for cancellation.  Anticipate he can proceed as planned barring acute status change and BG acceptable on DOS.  VS: BP (!) 155/74   Pulse 70   Temp 36.6 C   Resp 20   Ht 5\' 11"  (1.803 m)   Wt 98.4 kg   SpO2 98%   BMI 30.26 kg/m   PROVIDERS: Terald Sleeper, PA-C is PCP  Ramiro Harvest, [place] provides Nephrology care, last seen 03/06/2018   LABS: Elevated CBG, pt informed if markedly elevated on DOS could cause surgery to be cancelled. Results routed to PCP for review. (all labs ordered are listed, but only abnormal results are displayed)  Labs Reviewed  GLUCOSE, CAPILLARY - Abnormal; Notable for the following components:      Result Value   Glucose-Capillary 310 (*)    All other components within normal limits  HEMOGLOBIN A1C - Abnormal; Notable for the following components:   Hgb A1c MFr Bld 7.9 (*)    All other components within normal limits  COMPREHENSIVE METABOLIC PANEL - Abnormal; Notable for the following components:   Glucose, Bld 317 (*)    Total Bilirubin 1.3 (*)    All other components within normal limits  SURGICAL  PCR SCREEN  CBC  TYPE AND SCREEN  ABO/RH     IMAGES: CHEST - 2 VIEW 05/31/2018  COMPARISON:  None  FINDINGS: Normal heart size, mediastinal contours, and pulmonary vascularity.  Lungs clear.  No pleural effusion or pneumothorax.  Bones unremarkable.  IMPRESSION: Normal exam.   EKG: 05/31/2018: Sinus rhythm  CV: Cath 03/17/2011:  CONCLUSION:  1. Minimal nonobstructive coronary artery disease with 30% mid left      anterior descending coronary artery, fixed and ostial stenosis in      the first large marginal branch of circumflex artery, no      significant obstruction of the right coronary, normal left      ventricular function.  2. Mild elevation of pulmonary artery wedge and pulmonary artery      pressure.  Past Medical History:  Diagnosis Date  . Chronic kidney disease   . Complication of anesthesia    Hard to wake  . Degenerative joint disease (DJD) of lumbar spine   . Diabetes mellitus without complication ([place])    Type II  . Hypertension   . Lumbar stenosis   . Proteinuria   . Radicular leg pain     Past Surgical History:  Procedure Laterality Date  . BELPHAROPTOSIS REPAIR    . CARDIAC CATHETERIZATION    . EYE SURGERY    .  HEMORRHOID SURGERY      MEDICATIONS: . amLODipine (NORVASC) 10 MG tablet  . canagliflozin (INVOKANA) 100 MG TABS tablet  . cloNIDine (CATAPRES) 0.3 MG tablet  . finasteride (PROSCAR) 5 MG tablet  . HYDROcodone-homatropine (HYCODAN) 5-1.5 MG/5ML syrup  . loratadine (CLARITIN) 10 MG tablet  . metFORMIN (GLUCOPHAGE-XR) 500 MG 24 hr tablet  . mupirocin cream (BACTROBAN) 2 %  . spironolactone (ALDACTONE) 25 MG tablet  . tamsulosin (FLOMAX) 0.4 MG CAPS capsule   No current facility-administered medications for this encounter.      Wynonia Musty [facility] Stay [facility]/Anesthesiology Phone [phone] 06/29/2018 2:48 PM

## 2018-06-30 DIAGNOSIS — M5136 Other intervertebral disc degeneration, lumbar region: Secondary | ICD-10-CM | POA: Diagnosis not present

## 2018-06-30 NOTE — H&P (Addendum)
Patient ID: [name] MRN: [id number] DOB/AGE: 64-Jul-1957 64 y.o.  Admit date: (Not on file)  Admission Diagnoses:  Lumbar Degerative Disc Disease L4-S1  HPI: The patient is here today for a pre-operative History and Physical. They are scheduled for TLIF L4-S1   on 07-06-18 with Dr. Rolena [name] at [facility]. pt has sleep apnea.  Pt has diabetes and hypertension.  Past Medical History: Past Medical History:  Diagnosis Date  . Chronic kidney disease   . Complication of anesthesia    Hard to wake  . Degenerative joint disease (DJD) of lumbar spine   . Diabetes mellitus without complication ([name])    Type II  . Hypertension   . Lumbar stenosis   . Proteinuria   . Radicular leg pain     Surgical History: Past Surgical History:  Procedure Laterality Date  . BELPHAROPTOSIS REPAIR    . CARDIAC CATHETERIZATION    . EYE SURGERY    . HEMORRHOID SURGERY      Family History: Family History  Problem Relation Age of Onset  . Heart attack Father   . Hypertension Father   . Vision loss Sister        one eye  . Cancer Brother        skin  . Diabetes Brother     Social History: Social History   Socioeconomic History  . Marital status: Married    Spouse name: Not on file  . Number of children: Not on file  . Years of education: Not on file  . Highest education level: Not on file  Occupational History  . Not on file  Social Needs  . Financial resource strain: Not on file  . Food insecurity:    Worry: Not on file    Inability: Not on file  . Transportation needs:    Medical: Not on file    Non-medical: Not on file  Tobacco Use  . Smoking status: Never Smoker  . Smokeless tobacco: Never Used  Substance and Sexual Activity  . Alcohol use: Yes    Alcohol/week: 8.0 standard drinks    Types: 8 Cans of beer per week  . Drug use: No  . Sexual activity: Yes  Lifestyle  . Physical activity:    Days per week: Not on file    Minutes per session: Not  on file  . Stress: Not on file  Relationships  . Social connections:    Talks on phone: Not on file    Gets together: Not on file    Attends religious service: Not on file    Active member of club or organization: Not on file    Attends meetings of clubs or organizations: Not on file    Relationship status: Not on file  . Intimate partner violence:    Fear of current or ex partner: Not on file    Emotionally abused: Not on file    Physically abused: Not on file    Forced sexual activity: Not on file  Other Topics Concern  . Not on file  Social History Narrative  . Not on file    Allergies: Patient has no known allergies.  Medications: I have reviewed the patient's current medications.  Vital Signs: No data found.  Radiology: No results found.  Labs: Recent Labs    06/28/18 1321  WBC 7.5  RBC 4.43  HCT 41.2  PLT 174   Recent Labs    06/28/18 1321  NA 138  K 4.6  CL 102  CO2 26  BUN 15  CREATININE 1.14  GLUCOSE 317*  CALCIUM 9.7   No results for input(s): LABPT, INR in the last 72 hours.  Review of Systems: ROS  Physical Exam: There is no height or weight on file to calculate BMI.  Physical Exam  Constitutional: He is oriented to person, place, and time. He appears well-developed and well-nourished.  HENT:  Head: Normocephalic.  Cardiovascular: Normal rate and regular rhythm.  Respiratory: Effort normal and breath sounds normal.  GI: Soft. Bowel sounds are normal.  Neurological: He is alert and oriented to person, place, and time.  Skin: Skin is warm and dry.  Psychiatric: He has a normal mood and affect. His behavior is normal. Judgment and thought content normal.  Lumbar spine: Continues to have significant low back pain in the lumbar region radiating into the paraspinal area.  Every once in a while he will get intermittent dysesthesias into both lower extremities.  He has very limited flexion and extension and rotation due to the severe back  pain. Neuro: 5 out of 5 strength in the lower extremity.  Altered gait pattern primarily due to his back pain.  Negative Babinski test, negative clonus.  1+ symmetrical deep tendon reflexes in the lower extremity.  Currently intact sensation to light touch. Vascular: Lower extremity peripheral pulses are 1+ and symmetrical.  Compartments are soft and nontender  The patient's lumbar MRI from May 2019 is significant for prominent disc bulging at L4-5 facet disease moderate canal stenosis moderate to severe right stenosis impinging the right L5 nerve root moderate severe left foraminal narrowing impinging exiting right L4 nerve roots L5-S1 prominent disc bulging osteophyte complex spinal canal stenosis contacting descending S1 nerve roots severe left mild to moderate right foraminal narrowing    Assessment and Plan: Risks and benefits of surgery were discussed with the patient. These include: Infection, bleeding, death, stroke, paralysis, ongoing or worse pain, need for additional surgery, nonunion, leak of spinal fluid, adjacent segment degeneration requiring additional fusion surgery, Injury to abdominal vessels that can require anterior surgery to stop bleeding. Malposition of the cage and/or pedicle screws that could require additional surgery. Loss of bowel and bladder control. Postoperative hematoma causing neurologic compression that could require urgent or emergent re-operation.  Goals of surgery: Reduced (not eliminated) pain, and improved quality of life.  [name], PAC for Clifford Schools, MD [facility] [phone]  Patient's clinical exam today is essentially unchanged from his last office visit of 06/30/2018.  He continues to have severe back buttock and neuropathic leg pain right side is more significant than the left.  I have reviewed the case and the surgical plan with the patient and his wife and they are still in agreement with the procedure.  I have also gone back over the  risks and benefits and they have expressed an understanding.  We will plan on moving forward with a 2 level fusion today.

## 2018-07-06 ENCOUNTER — Inpatient Hospital Stay (HOSPITAL_COMMUNITY)
Admission: RE | Disposition: A | Discharge status: Home / Self Care | Payer: Self-pay | Source: Ambulatory Visit | Attending: Orthopedic Surgery

## 2018-07-06 ENCOUNTER — Inpatient Hospital Stay (HOSPITAL_COMMUNITY): Payer: PPO

## 2018-07-06 ENCOUNTER — Encounter (HOSPITAL_COMMUNITY): Payer: Self-pay | Admitting: *Deleted

## 2018-07-06 ENCOUNTER — Inpatient Hospital Stay (HOSPITAL_COMMUNITY): Payer: PPO | Admitting: Anesthesiology

## 2018-07-06 ENCOUNTER — Inpatient Hospital Stay (HOSPITAL_COMMUNITY): Payer: PPO | Admitting: Vascular Surgery

## 2018-07-06 ENCOUNTER — Inpatient Hospital Stay (HOSPITAL_COMMUNITY)
Admission: RE | Admit: 2018-07-06 | Discharge: 2018-07-08 | DRG: 460 | Disposition: A | Discharge status: Home / Self Care | Payer: PPO | Source: Ambulatory Visit | Attending: Orthopedic Surgery | Admitting: Orthopedic Surgery

## 2018-07-06 DIAGNOSIS — Z419 Encounter for procedure for purposes other than remedying health state, unspecified: Secondary | ICD-10-CM

## 2018-07-06 DIAGNOSIS — M5416 Radiculopathy, lumbar region: Secondary | ICD-10-CM | POA: Diagnosis not present

## 2018-07-06 DIAGNOSIS — M5116 Intervertebral disc disorders with radiculopathy, lumbar region: Secondary | ICD-10-CM | POA: Diagnosis not present

## 2018-07-06 DIAGNOSIS — I129 Hypertensive chronic kidney disease with stage 1 through stage 4 chronic kidney disease, or unspecified chronic kidney disease: Secondary | ICD-10-CM | POA: Diagnosis present

## 2018-07-06 DIAGNOSIS — G4730 Sleep apnea, unspecified: Secondary | ICD-10-CM | POA: Diagnosis not present

## 2018-07-06 DIAGNOSIS — N189 Chronic kidney disease, unspecified: Secondary | ICD-10-CM | POA: Diagnosis present

## 2018-07-06 DIAGNOSIS — Z79899 Other long term (current) drug therapy: Secondary | ICD-10-CM

## 2018-07-06 DIAGNOSIS — M48062 Spinal stenosis, lumbar region with neurogenic claudication: Principal | ICD-10-CM | POA: Diagnosis present

## 2018-07-06 DIAGNOSIS — Z7984 Long term (current) use of oral hypoglycemic drugs: Secondary | ICD-10-CM | POA: Diagnosis not present

## 2018-07-06 DIAGNOSIS — E1122 Type 2 diabetes mellitus with diabetic chronic kidney disease: Secondary | ICD-10-CM | POA: Diagnosis present

## 2018-07-06 DIAGNOSIS — Z981 Arthrodesis status: Secondary | ICD-10-CM

## 2018-07-06 DIAGNOSIS — M5127 Other intervertebral disc displacement, lumbosacral region: Secondary | ICD-10-CM | POA: Diagnosis not present

## 2018-07-06 DIAGNOSIS — M48061 Spinal stenosis, lumbar region without neurogenic claudication: Secondary | ICD-10-CM | POA: Diagnosis not present

## 2018-07-06 DIAGNOSIS — Z9189 Other specified personal risk factors, not elsewhere classified: Secondary | ICD-10-CM | POA: Diagnosis not present

## 2018-07-06 DIAGNOSIS — M5136 Other intervertebral disc degeneration, lumbar region: Secondary | ICD-10-CM | POA: Diagnosis not present

## 2018-07-06 DIAGNOSIS — M4807 Spinal stenosis, lumbosacral region: Secondary | ICD-10-CM | POA: Diagnosis not present

## 2018-07-06 HISTORY — PX: TRANSFORAMINAL LUMBAR INTERBODY FUSION (TLIF) WITH PEDICLE SCREW FIXATION 2 LEVEL: SHX6142

## 2018-07-06 LAB — GLUCOSE, CAPILLARY
GLUCOSE-CAPILLARY: 283 mg/dL — AB (ref 70–99)
Glucose-Capillary: 285 mg/dL — ABNORMAL HIGH (ref 70–99)
Glucose-Capillary: 270 mg/dL — ABNORMAL HIGH (ref 70–99)
Glucose-Capillary: 267 mg/dL — ABNORMAL HIGH (ref 70–99)

## 2018-07-06 SURGERY — TRANSFORAMINAL LUMBAR INTERBODY FUSION (TLIF) WITH PEDICLE SCREW FIXATION 2 LEVEL
Anesthesia: General

## 2018-07-06 MED ORDER — MORPHINE SULFATE (PF) 2 MG/ML IV SOLN
1.0000 mg | INTRAVENOUS | Status: DC | PRN
  Administered 2018-07-06: 1 mg via INTRAVENOUS
  Filled 2018-07-06: qty 1

## 2018-07-06 MED ORDER — METFORMIN HCL ER 500 MG PO TB24
500.0000 mg | ORAL_TABLET | Freq: Every day | ORAL | Status: DC
  Administered 2018-07-07 – 2018-07-08 (×2): 500 mg via ORAL
  Filled 2018-07-06 (×2): qty 1

## 2018-07-06 MED ORDER — METHOCARBAMOL 500 MG PO TABS
ORAL_TABLET | ORAL | Status: AC
  Filled 2018-07-06: qty 1

## 2018-07-06 MED ORDER — ACETAMINOPHEN 325 MG PO TABS
650.0000 mg | ORAL_TABLET | ORAL | Status: DC | PRN
  Administered 2018-07-07 (×2): 650 mg via ORAL
  Filled 2018-07-06 (×2): qty 2

## 2018-07-06 MED ORDER — OXYCODONE HCL 5 MG PO TABS
10.0000 mg | ORAL_TABLET | ORAL | Status: DC | PRN
  Administered 2018-07-06 – 2018-07-08 (×9): 10 mg via ORAL
  Filled 2018-07-06 (×8): qty 2

## 2018-07-06 MED ORDER — LIDOCAINE 2% (20 MG/ML) 5 ML SYRINGE
INTRAMUSCULAR | Status: DC | PRN
  Administered 2018-07-06: 80 mg via INTRAVENOUS

## 2018-07-06 MED ORDER — INSULIN ASPART 100 UNIT/ML ~~LOC~~ SOLN
0.0000 [IU] | SUBCUTANEOUS | Status: DC
  Administered 2018-07-06: 8 [IU] via SUBCUTANEOUS

## 2018-07-06 MED ORDER — BUPIVACAINE-EPINEPHRINE 0.25% -1:200000 IJ SOLN
INTRAMUSCULAR | Status: DC | PRN
  Administered 2018-07-06: 20 mL

## 2018-07-06 MED ORDER — MENTHOL 3 MG MT LOZG
1.0000 | LOZENGE | OROMUCOSAL | Status: DC | PRN
  Filled 2018-07-06: qty 9

## 2018-07-06 MED ORDER — CEFAZOLIN SODIUM 1 G IJ SOLR
INTRAMUSCULAR | Status: AC
  Filled 2018-07-06: qty 20

## 2018-07-06 MED ORDER — SODIUM CHLORIDE 0.9% FLUSH
3.0000 mL | Freq: Two times a day (BID) | INTRAVENOUS | Status: DC
  Administered 2018-07-06 – 2018-07-08 (×3): 3 mL via INTRAVENOUS

## 2018-07-06 MED ORDER — SUCCINYLCHOLINE CHLORIDE 20 MG/ML IJ SOLN
INTRAMUSCULAR | Status: DC | PRN
  Administered 2018-07-06: 140 mg via INTRAVENOUS
  Administered 2018-07-06: 60 mg via INTRAVENOUS

## 2018-07-06 MED ORDER — ONDANSETRON HCL 4 MG/2ML IJ SOLN: 4.0000 mg | Freq: Four times a day (QID) | INTRAMUSCULAR | Status: DC | PRN

## 2018-07-06 MED ORDER — INSULIN ASPART 100 UNIT/ML ~~LOC~~ SOLN
0.0000 [IU] | SUBCUTANEOUS | Status: DC
  Administered 2018-07-06: 8 [IU] via SUBCUTANEOUS
  Administered 2018-07-07: 11 [IU] via SUBCUTANEOUS
  Administered 2018-07-07: 5 [IU] via SUBCUTANEOUS
  Administered 2018-07-07: 3 [IU] via SUBCUTANEOUS
  Administered 2018-07-07: 5 [IU] via SUBCUTANEOUS
  Administered 2018-07-08: 3 [IU] via SUBCUTANEOUS
  Administered 2018-07-08: 2 [IU] via SUBCUTANEOUS
  Administered 2018-07-08: 8 [IU] via SUBCUTANEOUS
  Administered 2018-07-08: 5 [IU] via SUBCUTANEOUS

## 2018-07-06 MED ORDER — DEXAMETHASONE SODIUM PHOSPHATE 10 MG/ML IJ SOLN
INTRAMUSCULAR | Status: DC | PRN
  Administered 2018-07-06: 10 mg via INTRAVENOUS

## 2018-07-06 MED ORDER — METHOCARBAMOL 1000 MG/10ML IJ SOLN
500.0000 mg | Freq: Four times a day (QID) | INTRAVENOUS | Status: DC | PRN
  Filled 2018-07-06: qty 5

## 2018-07-06 MED ORDER — FENTANYL CITRATE (PF) 250 MCG/5ML IJ SOLN
INTRAMUSCULAR | Status: AC
  Filled 2018-07-06: qty 5

## 2018-07-06 MED ORDER — METHOCARBAMOL 500 MG PO TABS
500.0000 mg | ORAL_TABLET | Freq: Four times a day (QID) | ORAL | Status: DC | PRN
  Administered 2018-07-06 – 2018-07-07 (×5): 500 mg via ORAL
  Filled 2018-07-06 (×4): qty 1

## 2018-07-06 MED ORDER — PROPOFOL 10 MG/ML IV BOLUS
INTRAVENOUS | Status: AC
  Filled 2018-07-06: qty 20

## 2018-07-06 MED ORDER — THROMBIN 20000 UNITS EX SOLR
CUTANEOUS | Status: DC | PRN
  Administered 2018-07-06: 11:00:00 via TOPICAL

## 2018-07-06 MED ORDER — HYDROMORPHONE HCL 1 MG/ML IJ SOLN
0.2500 mg | INTRAMUSCULAR | Status: DC | PRN
  Administered 2018-07-06 (×2): 0.5 mg via INTRAVENOUS

## 2018-07-06 MED ORDER — TAMSULOSIN HCL 0.4 MG PO CAPS
0.4000 mg | ORAL_CAPSULE | Freq: Every day | ORAL | Status: DC
  Administered 2018-07-07 – 2018-07-08 (×2): 0.4 mg via ORAL
  Filled 2018-07-06 (×2): qty 1

## 2018-07-06 MED ORDER — CEFAZOLIN SODIUM-DEXTROSE 2-4 GM/100ML-% IV SOLN
2.0000 g | INTRAVENOUS | Status: AC
Start: 2018-07-06 — End: 2018-07-06
  Administered 2018-07-06 (×2): 2 g via INTRAVENOUS
  Filled 2018-07-06: qty 100

## 2018-07-06 MED ORDER — SURGIFOAM 100 EX MISC
CUTANEOUS | Status: DC | PRN
  Administered 2018-07-06: 1 via TOPICAL

## 2018-07-06 MED ORDER — ACETAMINOPHEN 650 MG RE SUPP: 650.0000 mg | RECTAL | Status: DC | PRN

## 2018-07-06 MED ORDER — SPIRONOLACTONE 25 MG PO TABS
25.0000 mg | ORAL_TABLET | Freq: Every day | ORAL | Status: DC
  Administered 2018-07-07 – 2018-07-08 (×2): 25 mg via ORAL
  Filled 2018-07-06 (×2): qty 1

## 2018-07-06 MED ORDER — ONDANSETRON 4 MG PO TBDP: 4.0000 mg | ORAL_TABLET | Freq: Three times a day (TID) | ORAL | 0 refills | Status: DC | PRN

## 2018-07-06 MED ORDER — CEFAZOLIN SODIUM-DEXTROSE 2-4 GM/100ML-% IV SOLN
2.0000 g | Freq: Three times a day (TID) | INTRAVENOUS | Status: AC
  Administered 2018-07-07 (×2): 2 g via INTRAVENOUS
  Filled 2018-07-06 (×3): qty 100

## 2018-07-06 MED ORDER — PROMETHAZINE HCL 25 MG/ML IJ SOLN: 6.2500 mg | INTRAMUSCULAR | Status: DC | PRN

## 2018-07-06 MED ORDER — LACTATED RINGERS IV SOLN
Freq: Once | INTRAVENOUS | Status: AC
  Administered 2018-07-06: 08:00:00 via INTRAVENOUS

## 2018-07-06 MED ORDER — CANAGLIFLOZIN 100 MG PO TABS
100.0000 mg | ORAL_TABLET | Freq: Every day | ORAL | Status: DC
  Administered 2018-07-07 – 2018-07-08 (×2): 100 mg via ORAL
  Filled 2018-07-06 (×2): qty 1

## 2018-07-06 MED ORDER — AMLODIPINE BESYLATE 10 MG PO TABS
10.0000 mg | ORAL_TABLET | Freq: Every day | ORAL | Status: DC
  Administered 2018-07-07 – 2018-07-08 (×2): 10 mg via ORAL
  Filled 2018-07-06 (×2): qty 1

## 2018-07-06 MED ORDER — LIDOCAINE 2% (20 MG/ML) 5 ML SYRINGE
INTRAMUSCULAR | Status: AC
  Filled 2018-07-06: qty 5

## 2018-07-06 MED ORDER — SODIUM CHLORIDE 0.9 % IV SOLN
INTRAVENOUS | Status: DC | PRN
  Administered 2018-07-06: 16:00:00 via INTRAVENOUS

## 2018-07-06 MED ORDER — ONDANSETRON HCL 4 MG/2ML IJ SOLN
INTRAMUSCULAR | Status: AC
  Filled 2018-07-06: qty 2

## 2018-07-06 MED ORDER — INSULIN ASPART 100 UNIT/ML ~~LOC~~ SOLN
SUBCUTANEOUS | Status: AC
  Filled 2018-07-06: qty 1

## 2018-07-06 MED ORDER — CLONIDINE HCL 0.3 MG PO TABS
0.3000 mg | ORAL_TABLET | Freq: Two times a day (BID) | ORAL | Status: DC
  Administered 2018-07-07 – 2018-07-08 (×3): 0.3 mg via ORAL
  Filled 2018-07-06: qty 1
  Filled 2018-07-06 (×3): qty 3
  Filled 2018-07-06 (×2): qty 1

## 2018-07-06 MED ORDER — LACTATED RINGERS IV SOLN
INTRAVENOUS | Status: DC | PRN
  Administered 2018-07-06: 10:00:00 via INTRAVENOUS

## 2018-07-06 MED ORDER — ONDANSETRON HCL 4 MG/2ML IJ SOLN
INTRAMUSCULAR | Status: DC | PRN
  Administered 2018-07-06: 4 mg via INTRAVENOUS

## 2018-07-06 MED ORDER — OXYCODONE HCL 5 MG PO TABS
ORAL_TABLET | ORAL | Status: AC
  Filled 2018-07-06: qty 2

## 2018-07-06 MED ORDER — OXYCODONE-ACETAMINOPHEN 10-325 MG PO TABS: 1.0000 | ORAL_TABLET | ORAL | 0 refills | Status: AC | PRN

## 2018-07-06 MED ORDER — SODIUM CHLORIDE 0.9 % IV SOLN
250.0000 mL | INTRAVENOUS | Status: DC
  Administered 2018-07-07 (×3): 250 mL via INTRAVENOUS

## 2018-07-06 MED ORDER — OXYCODONE HCL 5 MG/5ML PO SOLN: 5.0000 mg | Freq: Once | ORAL | Status: DC | PRN

## 2018-07-06 MED ORDER — OXYCODONE HCL 5 MG PO TABS: 5.0000 mg | ORAL_TABLET | Freq: Once | ORAL | Status: DC | PRN

## 2018-07-06 MED ORDER — HYDROMORPHONE HCL 1 MG/ML IJ SOLN
INTRAMUSCULAR | Status: AC
  Filled 2018-07-06: qty 1

## 2018-07-06 MED ORDER — PHENOL 1.4 % MT LIQD: 1.0000 | OROMUCOSAL | Status: DC | PRN

## 2018-07-06 MED ORDER — MIDAZOLAM HCL 5 MG/5ML IJ SOLN
INTRAMUSCULAR | Status: DC | PRN
  Administered 2018-07-06: 2 mg via INTRAVENOUS

## 2018-07-06 MED ORDER — SODIUM CHLORIDE 0.9 % IV SOLN
INTRAVENOUS | Status: DC | PRN
  Administered 2018-07-06: 25 ug/min via INTRAVENOUS

## 2018-07-06 MED ORDER — PROPOFOL 500 MG/50ML IV EMUL
INTRAVENOUS | Status: DC | PRN
  Administered 2018-07-06: 25 ug/kg/min via INTRAVENOUS

## 2018-07-06 MED ORDER — FENTANYL CITRATE (PF) 100 MCG/2ML IJ SOLN
INTRAMUSCULAR | Status: DC | PRN
  Administered 2018-07-06 (×2): 50 ug via INTRAVENOUS
  Administered 2018-07-06: 100 ug via INTRAVENOUS
  Administered 2018-07-06 (×2): 50 ug via INTRAVENOUS

## 2018-07-06 MED ORDER — THROMBIN (RECOMBINANT) 20000 UNITS EX SOLR
CUTANEOUS | Status: AC
  Filled 2018-07-06: qty 20000

## 2018-07-06 MED ORDER — ONDANSETRON HCL 4 MG PO TABS: 4.0000 mg | ORAL_TABLET | Freq: Four times a day (QID) | ORAL | Status: DC | PRN

## 2018-07-06 MED ORDER — SODIUM CHLORIDE 0.9% FLUSH
3.0000 mL | INTRAVENOUS | Status: DC | PRN
  Administered 2018-07-07: 3 mL via INTRAVENOUS
  Filled 2018-07-06: qty 3

## 2018-07-06 MED ORDER — 0.9 % SODIUM CHLORIDE (POUR BTL) OPTIME
TOPICAL | Status: DC | PRN
  Administered 2018-07-06 (×3): 1000 mL

## 2018-07-06 MED ORDER — FENTANYL CITRATE (PF) 100 MCG/2ML IJ SOLN: 25.0000 ug | INTRAMUSCULAR | Status: DC | PRN

## 2018-07-06 MED ORDER — METHOCARBAMOL 500 MG PO TABS: 500.0000 mg | ORAL_TABLET | Freq: Three times a day (TID) | ORAL | 0 refills | Status: DC

## 2018-07-06 MED ORDER — PHENYLEPHRINE HCL 10 MG/ML IJ SOLN: INTRAMUSCULAR | Status: DC | PRN

## 2018-07-06 MED ORDER — PROPOFOL 10 MG/ML IV BOLUS
INTRAVENOUS | Status: DC | PRN
  Administered 2018-07-06: 20 mg via INTRAVENOUS
  Administered 2018-07-06: 30 mg via INTRAVENOUS
  Administered 2018-07-06: 150 mg via INTRAVENOUS

## 2018-07-06 MED ORDER — DEXAMETHASONE SODIUM PHOSPHATE 10 MG/ML IJ SOLN
INTRAMUSCULAR | Status: AC
  Filled 2018-07-06: qty 1

## 2018-07-06 MED ORDER — LACTATED RINGERS IV SOLN
INTRAVENOUS | Status: DC | PRN
  Administered 2018-07-06: 08:00:00 via INTRAVENOUS

## 2018-07-06 MED ORDER — FINASTERIDE 5 MG PO TABS
5.0000 mg | ORAL_TABLET | Freq: Every day | ORAL | Status: DC
  Administered 2018-07-07 – 2018-07-08 (×2): 5 mg via ORAL
  Filled 2018-07-06 (×2): qty 1

## 2018-07-06 MED ORDER — BUPIVACAINE-EPINEPHRINE (PF) 0.25% -1:200000 IJ SOLN
INTRAMUSCULAR | Status: AC
  Filled 2018-07-06: qty 30

## 2018-07-06 MED ORDER — SUCCINYLCHOLINE CHLORIDE 200 MG/10ML IV SOSY
PREFILLED_SYRINGE | INTRAVENOUS | Status: AC
  Filled 2018-07-06: qty 10

## 2018-07-06 MED ORDER — INSULIN ASPART 100 UNIT/ML ~~LOC~~ SOLN
10.0000 [IU] | Freq: Once | SUBCUTANEOUS | Status: AC
  Administered 2018-07-06: 10 [IU] via SUBCUTANEOUS
  Filled 2018-07-06: qty 1

## 2018-07-06 MED ORDER — ONDANSETRON HCL 4 MG/2ML IJ SOLN: 4.0000 mg | Freq: Once | INTRAMUSCULAR | Status: DC | PRN

## 2018-07-06 MED ORDER — MIDAZOLAM HCL 2 MG/2ML IJ SOLN
INTRAMUSCULAR | Status: AC
  Filled 2018-07-06: qty 2

## 2018-07-06 MED ORDER — OXYCODONE HCL 5 MG PO TABS
5.0000 mg | ORAL_TABLET | ORAL | Status: DC | PRN
  Administered 2018-07-08: 5 mg via ORAL
  Filled 2018-07-06: qty 1

## 2018-07-06 SURGICAL SUPPLY — 82 items
BLADE CLIPPER SURG (BLADE) IMPLANT
BUR EGG ELITE 4.0 (BURR) IMPLANT
CABLE BIPOLOR RESECTION CORD (MISCELLANEOUS) ×2 IMPLANT
CAGE TLIF NANOLOCK 11 (Cage) ×2 IMPLANT
CAGE TLIF NANOLOCK XL 9 (Cage) ×2 IMPLANT
CLIP NEUROVISION LG (CLIP) ×2 IMPLANT
CLSR STERI-STRIP ANTIMIC 1/2X4 (GAUZE/BANDAGES/DRESSINGS) ×4 IMPLANT
COVER MAYO STAND STRL (DRAPES) IMPLANT
COVER SURGICAL LIGHT HANDLE (MISCELLANEOUS) ×2 IMPLANT
DRAPE C-ARM 42X72 X-RAY (DRAPES) ×2 IMPLANT
DRAPE C-ARMOR (DRAPES) ×2 IMPLANT
DRAPE POUCH INSTRU U-SHP 10X18 (DRAPES) ×2 IMPLANT
DRAPE SURG 17X23 STRL (DRAPES) ×2 IMPLANT
DRAPE U-SHAPE 47X51 STRL (DRAPES) ×2 IMPLANT
DRSG OPSITE POSTOP 4X6 (GAUZE/BANDAGES/DRESSINGS) ×2 IMPLANT
DRSG OPSITE POSTOP 4X8 (GAUZE/BANDAGES/DRESSINGS) ×2 IMPLANT
DURAPREP 26ML APPLICATOR (WOUND CARE) ×2 IMPLANT
ELECT BLADE 4.0 EZ CLEAN MEGAD (MISCELLANEOUS)
ELECT BLADE 6.5 EXT (BLADE) ×2 IMPLANT
ELECT CAUTERY BLADE 6.4 (BLADE) ×2 IMPLANT
ELECT PENCIL ROCKER SW 15FT (MISCELLANEOUS) ×2 IMPLANT
ELECT REM PT RETURN 9FT ADLT (ELECTROSURGICAL) ×2
ELECTRODE BLDE 4.0 EZ CLN MEGD (MISCELLANEOUS) IMPLANT
ELECTRODE REM PT RTRN 9FT ADLT (ELECTROSURGICAL) ×1 IMPLANT
EVACUATOR SILICONE 100CC (DRAIN) ×2 IMPLANT
GLOVE BIOGEL PI IND STRL 8.5 (GLOVE) ×1 IMPLANT
GLOVE BIOGEL PI INDICATOR 8.5 (GLOVE) ×1
GLOVE SS BIOGEL STRL SZ 8.5 (GLOVE) ×1 IMPLANT
GLOVE SUPERSENSE BIOGEL SZ 8.5 (GLOVE) ×1
GOWN STRL REUS W/ TWL LRG LVL3 (GOWN DISPOSABLE) ×1 IMPLANT
GOWN STRL REUS W/TWL 2XL LVL3 (GOWN DISPOSABLE) ×4 IMPLANT
GOWN STRL REUS W/TWL LRG LVL3 (GOWN DISPOSABLE) ×1
GUIDEWIRE NITINOL BEVEL TIP (WIRE) ×12 IMPLANT
KIT BASIN OR (CUSTOM PROCEDURE TRAY) ×2 IMPLANT
KIT POSITION SURG JACKSON T1 (MISCELLANEOUS) ×2 IMPLANT
KIT TURNOVER KIT B (KITS) ×2 IMPLANT
LIGHT SOURCE ANGLE TIP STR 7FT (MISCELLANEOUS) ×2 IMPLANT
MODULE EMG NEEDLE SSEP NVM5 (NEEDLE) ×2 IMPLANT
MODULE NVM5 NEXT GEN EMG (NEEDLE) ×2 IMPLANT
NEEDLE 22X1 1/2 (OR ONLY) (NEEDLE) ×2 IMPLANT
NEEDLE I-PASS III (NEEDLE) ×2 IMPLANT
NEEDLE SPNL 18GX3.5 QUINCKE PK (NEEDLE) ×2 IMPLANT
NS IRRIG 1000ML POUR BTL (IV SOLUTION) ×2 IMPLANT
PACK LAMINECTOMY ORTHO (CUSTOM PROCEDURE TRAY) ×2 IMPLANT
PACK UNIVERSAL I (CUSTOM PROCEDURE TRAY) ×2 IMPLANT
PAD ARMBOARD 7.5X6 YLW CONV (MISCELLANEOUS) ×4 IMPLANT
PATTIES SURGICAL .5 X.5 (GAUZE/BANDAGES/DRESSINGS) ×2 IMPLANT
PATTIES SURGICAL .5 X1 (DISPOSABLE) ×2 IMPLANT
POSITIONER HEAD PRONE TRACH (MISCELLANEOUS) ×2 IMPLANT
PROBE BALL TIP NVM5 SNG USE (BALLOONS) ×2 IMPLANT
PUTTY DBX 1CC (Putty) ×4 IMPLANT
PUTTY DBX 1CC DEPUY (Putty) ×2 IMPLANT
REDUCTION EXT RELINE MAS MOD (Neuro Prosthesis/Implant) ×6 IMPLANT
ROD RELINE TI MAS 5.5X70MM LD (Rod) ×2 IMPLANT
ROD RELINE TI MAS LD 5.5X65 (Rod) ×2 IMPLANT
SCREW LOCK RELINE 5.5 TULIP (Screw) ×18 IMPLANT
SCREW RED MAS POLY 7.5X40MM (Screw) ×2 IMPLANT
SCREW RELINE MAS POLY 6.5X40MM (Screw) ×2 IMPLANT
SCREW RELINE RED 6.5X45MM POLY (Screw) ×2 IMPLANT
SCREW SHANK MAS MOD 6.5X40MM (Screw) ×2 IMPLANT
SCREW SHANK RELINE 6.5X45MM 2C (Screw) ×2 IMPLANT
SCREW SHANK RELINE 7.5X40MM 2C (Screw) ×2 IMPLANT
SHEET CONFORM 45LX20WX5H (Bone Implant) ×2 IMPLANT
SPONGE LAP 4X18 RFD (DISPOSABLE) ×4 IMPLANT
SPONGE SURGIFOAM ABS GEL 100 (HEMOSTASIS) ×2 IMPLANT
SURGIFLO W/THROMBIN 8M KIT (HEMOSTASIS) ×4 IMPLANT
SUT BONE WAX W31G (SUTURE) ×2 IMPLANT
SUT MNCRL AB 3-0 PS2 18 (SUTURE) ×4 IMPLANT
SUT SILK 3 0 SH 30 (SUTURE) ×2 IMPLANT
SUT VIC AB 1 CT1 18XCR BRD 8 (SUTURE) ×1 IMPLANT
SUT VIC AB 1 CT1 8-18 (SUTURE) ×1
SUT VIC AB 1 CTX 36 (SUTURE)
SUT VIC AB 1 CTX36XBRD ANBCTR (SUTURE) IMPLANT
SUT VIC AB 2-0 CT1 18 (SUTURE) ×2 IMPLANT
SUT VICRYL 0 UR6 27IN ABS (SUTURE) ×2 IMPLANT
SYR BULB IRRIGATION 50ML (SYRINGE) ×2 IMPLANT
SYR CONTROL 10ML LL (SYRINGE) ×2 IMPLANT
TOWEL GREEN STERILE (TOWEL DISPOSABLE) ×2 IMPLANT
TOWEL GREEN STERILE FF (TOWEL DISPOSABLE) ×2 IMPLANT
TRAY FOLEY MTR SLVR 16FR STAT (SET/KITS/TRAYS/PACK) ×2 IMPLANT
WATER STERILE IRR 1000ML POUR (IV SOLUTION) ×2 IMPLANT
YANKAUER SUCT BULB TIP NO VENT (SUCTIONS) ×2 IMPLANT

## 2018-07-06 NOTE — Brief Op Note (Signed)
07/06/2018  4:32 PM  PATIENT:  [name]  62 y.o. 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

## 2018-07-06 NOTE — Op Note (Signed)
Operative report  Preoperative diagnosis: Lumbar degenerative disc disease.  Lumbar spinal stenosis with neurogenic claudication  Postoperative diagnosis: Same  Operative procedure: Transforaminal lumbar interbody fusion L4-5, and L5-S1  First assistant: Ronette Deter, PA  Complications: None  Implants:  Titan nano lock intervertebral cages.  L4-5: 11 mm high extra long.  L5-S1: 9 mm high extra long.   Graft material: Autograft / DBX  /  Conform allograft sheet   Nuvasive MIS pedicle screw system:       L4     L5           S1        Left:    7.5x40 6.5x45     7.5x40        Right: 6.5x40 6.5x45      7.5x40  Indications: Very pleasant 62 year old gentleman who presents with progressive debilitating back buttock and bilateral neuropathic leg pain right side worse than the left.  Imaging studies confirmed a 4 5 disc herniation with marked spinal stenosis and degenerative disc disease.  After attempts to manage his pain concern and improve his quality of life had failed we elected to proceed with surgery.  All appropriate risks benefits and alternatives were discussed with the patient and his wife and consent was obtained.  Operative report: Patient was brought the operating room.  After successful induction of general anesthesia patient endotracheal intubation teds SCDs and a Foley were inserted.  Neuro monitoring representative then applied all appropriate pedicle for intraoperative SSEPs and EMG activity.  Once this was completed he was turned prone onto the Shiffer frame and all bony problems are well-padded.  The back was then prepped and draped in a standard fashion.  Timeout was taken to confirm patient procedure and all other important data.  On the left side identified using fluoroscopy the lateral aspect of the L4 pedicle.  This was marked out on the skin.  As was the L5 and S1.  I infiltrated this area with quarter percent Marcaine with epinephrine.  A small longitudinal incision was made  and the Jamshidi needle was advanced percutaneously to the lateral aspect of the L4 pedicle.  Once I confirmed position I advanced the Jamshidi needle using AP fluoroscopy and live neuro monitoring.  Once I was nearing the medial wall of the pedicle on the AP view I switched the lateral view to confirm satisfactory trajectory.  I also confirmed that I was just beyond the posterior wall the vertebral body confirming proper position.  I then advanced the Jamshidi needle and then cannulated the pedicle.  I repeated this exact same procedure at L5 and S1.  Once all 3 pedicles were cannulated on the left side I then went to the right I again marked out the lateral borders of the pedicle and infiltrated this area with quarter percent Marcaine with epinephrine.  A single Wiltsie incision was made spanning the distance.  Sharp dissection was carried out down to the deep fascia.  I incised the deep fascia and then bluntly dissected through the paraspinal muscles until I could palpate the lateral aspect of the L3-4 facet.  Once I was able to do this I placed my Jamshidi needle on the lateral aspect of the pedicle and using the same technique I used on the left side I advanced the Jamshidi needle into the L5 for pedicle and then cannulated the pedicle.  I repeated this at L5 and S1.  At this point once all of the right pedicles were cannulated  I then measured and then placed the appropriate size screw attached to the retracting blade.  I then assembled the retracting unit at L5-S1 and I mobilized the remaining prevertebral fascia medially and protected that with a retractor blade.  I now had excellent visualization of the posterior lateral aspect of the L5-S1 space.  Using an osteotome I resected the inferior L5 facet complex in its entirety.  This allowed me then to perform a complete L5 hemilaminectomy using Kerrison Roger.  Once I had removed the bone I then used a Chartered loss adjuster and elevate the ligamentum flavum  from the thecal sac.  This was then resected with Kerrison rongeurs.  I could now visualize the S1 nerve root as well as the S1 pedicle.  I continued with my Kerrison Roger to perform a foraminotomy of S1.  I then resected the superior aspect of the S1 facet so that I could completely visualize the posterior lateral aspect of the disc space.  Once I resected this portion of the facet and took down the remaining portion of the pars I Guilloud could visualize the L5 nerve root in the neuroforamen.  There was a large epidural veins all were coagulated with electrocautery.  I then placed neuro patties to protect the exiting and traversing nerve root and retracted the thecal sac with a nerve root retractor.  An annulotomy was performed with a 15 blade scalpel and I used pituitary rongeurs curettes and Kerrison rongeurs to remove all of the disc material.  I now had bleeding subchondral bone.  I also was able to move it remove the disc fragment that had herniated and compressed the S1 nerve root.  At this point with the discectomy complete I then used trial spacers and elected to use the size 9 Titan cage.  This cage was obtained and packed with the autograft that harvested from the decompression along with DBX.  A portion of the conformer sheet was then laid in the disc space in the anterior third.  I packed some additional autograft the disc space.  Using a small trial spacer I made sure the graft was compacted anteriorly and would not affect the insertion of the cage.  With the graft in place I obtained the cage and malleted that to the appropriate depth.  Using the bone tamps I then repositioned the cage so that it was horizontal in the anterior third of the disc space and was beyond the midline.  At this point I was pleased with the overall final position of the cage.  I then placed FloSeal over the laminectomy defect and then repositioned my retractor to now expose the L4-5 level.  With the L4-5 level now  exposed I resected performed a laminotomy of L4 with a 3 and 4 mm Kerrison right sure.  Using the osteotome I resected the end tire inferior L4 facet leaving just the remainder of the L5 superior facet complex.  I resected the overhanging osteophyte so I could completely decompress the lateral recess.  I continued to track the L5 nerve root from the lateral recess down inferiorly into the foramen that had just decompressed.  With the L5 hemilaminectomy and L4 hemilaminotomy I had an adequate central decompression lateral recess decompression and foraminal decompression.  With the neural decompression complete I placed neuro patties to protect the exiting 4 and traversing 5 root and then began my discectomy.  Annulotomy of L4-5 was performed with a 15 blade scalpel and then using the same technique I  used at L5-S1 I resected the disc material as well as the disc fragment that was seen on his preoperative MRI.  Once the discectomy was complete I trialed and elected to use the size 11 spacer.  The cage was obtained packed with autograft plus DBX.  The conformer sheet was laid in the anterior aspect of the disc space as well as some remaining bone.  With the neural elements protected I then placed to age.  Once it was properly positioned I then used the bone tamps to transit translated from the vertical position into a horizontal position.  At this point both cages were properly positioned and was pleased with both the AP and lateral x-rays.  With the decompression and interbody excision complete I then placed the polyaxial screw heads onto the screws and remove the retracting blades.  I measured and obtain the appropriate size rod.  I did prior to connecting the rod remove the kyphosis that had built into the Fesler frame to better approximate his lumbar lordosis.  The rod was then secured to the pedicle screw construct and locked in place using the top locking knots.  All locking nuts were then torqued off the  kidney manufacturer standards.  I then remove the excess portion of the insertion tabs.  At this point I went to the left-hand side and again measured and placed my rod.  Is at this time then I inadvertently placed the rod so that was medial to the L5 screw.  As such I had to remove the locking knots in the screw and reposition it.  However the L4 screw was very difficult to get reduced since the temporary blades and popped off.  As such I swapped out this screw to a 7.5 diameter screw.  To facilitate the rod insertion.  At this point I reinserted the rod and I again checked to ensure that it was properly positioned.  Once I confirmed with direct visualization and an LAD and fluoroscopy I locked the risk rod with the top locking knots.  All locking sets were torqued off.  It should be noted that prior to placing the rod I did sting every screw in each screw showed no activity at greater than 40 mA of stimulation.  The only exception was the left L4 7.5 x 40 mm screw.  The screw showed minimal EMG activity at 37 mA.  Once the construct was secured I took my final x-rays and I was pleased with both the AP and lateral views.  Rod was secured.  All wounds were copiously irrigated with normal saline.  On the right-hand side I made sure that hemostasis using bipolar electrocautery as well as FloSeal.  I did place a deep drain and brought that out through a separate stab incision.  All wounds were then closed in a layered fashion with interrupted #1 Vicryl suture, 0 Vicryl suture, 2-0 Vicryl suture and the skin was reapproximated with 3-0 Monocryl.  Steri-Strips and dry dressings were applied.  At the end of the case all needle sponge counts were correct.  There were no adverse intraoperative events.

## 2018-07-06 NOTE — Anesthesia Procedure Notes (Signed)
Procedure Name: Intubation Date/Time: 07/06/2018 9:44 AM Performed by: Scheryl Darter, CRNA Pre-anesthesia Checklist: Patient identified, Emergency Drugs available, Suction available and Patient being monitored Patient Re-evaluated:Patient Re-evaluated prior to induction Oxygen Delivery Method: Circle System Utilized Preoxygenation: Pre-oxygenation with 100% oxygen Induction Type: IV induction Ventilation: Mask ventilation without difficulty Laryngoscope Size: Miller and 3 Grade View: Grade II Tube type: Oral Tube size: 7.5 mm Number of attempts: 1 Airway Equipment and Method: Stylet and Oral airway Placement Confirmation: ETT inserted through vocal cords under direct vision,  positive ETCO2 and breath sounds checked- equal and bilateral Secured at: 24 cm Tube secured with: Tape Dental Injury: Teeth and Oropharynx as per pre-operative assessment

## 2018-07-06 NOTE — Discharge Instructions (Signed)
Spinal Fusion, Care After °These instructions give you information about caring for yourself after your procedure. Your doctor may also give you more specific instructions. Call your doctor if you have any problems or questions after your procedure. °Follow these instructions at home: °Medicines °· Take over-the-counter and prescription medicines only as told by your doctor. These include any medicines for pain. °· Do not drive for 24 hours if you received a sedative. °· Do not drive or use heavy machinery while taking prescription pain medicine. °· If you were prescribed an antibiotic medicine, take it as told by your doctor. Do not stop taking the antibiotic even if you start to feel better. °Surgical Cut (Incision) Care °· Follow instructions from your doctor about how to take care of your surgical cut. Make sure you: °? Wash your hands with soap and water before you change your bandage (dressing). If you cannot use soap and water, use hand sanitizer. °? Change your bandage as told by your doctor. °? Leave stitches (sutures), skin glue, or skin tape (adhesive) strips in place. They may need to stay in place for 2 weeks or longer. If tape strips get loose and curl up, you may trim the loose edges. Do not remove tape strips completely unless your doctor says it is okay. °· Keep your surgical cut clean and dry. Do not take baths, swim, or use a hot tub until your doctor says it is okay. °· Check your surgical cut and the area around it every day for: °? Redness. °? Swelling. °? Fluid. °Physical Activity °· Return to your normal activities as told by your doctor. Ask your doctor what activities are safe for you. Rest and protect your back as much as you can. °· Follow instructions from your doctor about how to move. Use good posture to help your spine heal. °· Do not lift anything that is heavier than 8 lb (3.6 kg) or as told by your doctor until he or she says that it is safe. Do not lift anything over your  head. °· Do not twist or bend at the waist until your doctor says it is okay. °· Avoid pushing or pulling motions. °· Do not sit or lie down in the same position for long periods of time. °· Do not start to exercise until your doctor says it is okay. Ask your doctor what kinds of exercise you can do to make your back stronger. °General instructions °· If you were given a brace, use it as told by your doctor. °· Wear compression stockings as told by your doctor. °· Do not use tobacco products. These include cigarettes, chewing tobacco, or e-cigarettes. If you need help quitting, ask your doctor. °· Keep all follow-up visits as told by your doctor. This is important. This includes any visits with your physical therapist, if this applies. °Contact a doctor if: °· Your pain gets worse. °· Your medicine does not help your pain. °· Your legs or feet become painful or swollen. °· Your surgical cut is red, swollen, or painful. °· You have fluid, blood, or pus coming from your surgical cut. °· You feel sick to your stomach (nauseous). °· You throw up (vomit). °· Your have weakness or loss of feeling (numbness) in your legs that is new or getting worse. °· You have a fever. °· You have trouble controlling when you pee (urinate) or poop (have a bowel movement). °Get help right away if: °· Your pain is very bad. °· You have   chest pain. °· You have trouble breathing. °· You start to have a cough. °These symptoms may be an emergency. Do not wait to see if the symptoms will go away. Get medical help right away. Call your local emergency services (911 in the [place] Do not drive yourself to the hospital. °This information is not intended to replace advice given to you by your health care provider. Make sure you discuss any questions you have with your health care provider. °Document Released: 02/25/2011 Document Revised: 06/29/2016 Document Reviewed: 04/16/2015 °Elsevier Interactive Patient Education © 2018 [employer] ° °

## 2018-07-06 NOTE — Transfer of Care (Signed)
Immediate Anesthesia Transfer of Care Note  Patient: [name]  Procedure(s) Performed: TRANSFORAMINAL LUMBAR INTERBODY FUSION (TLIF) L4-S1 (N/A )  Patient Location: PACU  Anesthesia Type:General  Level of Consciousness:drowsy  Airway & Oxygen Therapy: Patient Spontanous Breathing and Patient connected to face mask oxygen  Post-op Assessment: Report given to RN and Post -op Vital signs reviewed and stable  Post vital signs: Reviewed and stable  Last Vitals:  Vitals Value Taken Time  BP 130/69 07/06/2018  4:54 PM  Temp    Pulse 71 07/06/2018  5:00 PM  Resp 19 07/06/2018  5:00 PM  SpO2 100 % 07/06/2018  5:00 PM  Vitals shown include unvalidated device data.  Last Pain:  Vitals:   07/06/18 0809  TempSrc:   PainSc: 4       Patients Stated Pain Goal: 3 (94/07/68 0881)  Complications: No apparent anesthesia complications

## 2018-07-06 NOTE — Anesthesia Preprocedure Evaluation (Signed)
Anesthesia Evaluation  Patient identified by MRN, date of birth, ID band Patient awake    Reviewed: Allergy & Precautions, NPO status , Patient's Chart, lab work & pertinent test results, reviewed documented beta blocker date and time   Airway Mallampati: II  TM Distance: >3 FB Neck ROM: Full    Dental no notable dental hx.    Pulmonary neg pulmonary ROS,    Pulmonary exam normal breath sounds clear to auscultation       Cardiovascular hypertension, Pt. on medications and Pt. on home beta blockers negative cardio ROS Normal cardiovascular exam Rhythm:Regular Rate:Normal     Neuro/Psych negative neurological ROS  negative psych ROS   GI/Hepatic negative GI ROS, Neg liver ROS,   Endo/Other  negative endocrine ROSdiabetes, Type 2, Oral Hypoglycemic Agents  Renal/GU negative Renal ROS  negative genitourinary   Musculoskeletal negative musculoskeletal ROS (+) Arthritis , Osteoarthritis,    Abdominal   Peds negative pediatric ROS (+)  Hematology negative hematology ROS (+)   Anesthesia Other Findings   Reproductive/Obstetrics negative OB ROS                             Anesthesia Physical Anesthesia Plan  ASA: III  Anesthesia Plan: General   Post-op Pain Management:    Induction: Intravenous  PONV Risk Score and Plan: 2 and Ondansetron, Midazolam and Treatment may vary due to age or medical condition  Airway Management Planned: Oral ETT  Additional Equipment:   Intra-op Plan:   Post-operative Plan: Extubation in OR  Informed Consent: I have reviewed the patients History and Physical, chart, labs and discussed the procedure including the risks, benefits and alternatives for the proposed anesthesia with the patient or authorized representative who has indicated his/her understanding and acceptance.   Dental advisory given  Plan Discussed with: CRNA  Anesthesia Plan Comments:          Anesthesia Quick Evaluation

## 2018-07-07 LAB — GLUCOSE, CAPILLARY
GLUCOSE-CAPILLARY: 205 mg/dL — AB (ref 70–99)
GLUCOSE-CAPILLARY: 214 mg/dL — AB (ref 70–99)
GLUCOSE-CAPILLARY: 316 mg/dL — AB (ref 70–99)
Glucose-Capillary: 215 mg/dL — ABNORMAL HIGH (ref 70–99)
Glucose-Capillary: 194 mg/dL — ABNORMAL HIGH (ref 70–99)
Glucose-Capillary: 320 mg/dL — ABNORMAL HIGH (ref 70–99)

## 2018-07-07 MED ORDER — CEFAZOLIN SODIUM-DEXTROSE 1-4 GM/50ML-% IV SOLN
1.0000 g | Freq: Three times a day (TID) | INTRAVENOUS | Status: DC
  Administered 2018-07-07 – 2018-07-08 (×4): 1 g via INTRAVENOUS
  Filled 2018-07-07 (×5): qty 50

## 2018-07-07 MED FILL — Thrombin (Recombinant) For Soln 20000 Unit: CUTANEOUS | Qty: 1 | Status: AC

## 2018-07-07 NOTE — Social Work (Signed)
CSW acknowledging therapy recommendations, no therapy follow up warranted at this time, SNF not appropriate.   CSW signing off. Please consult if any additional needs arise.  Alexander Mt, [place] Work [phone]

## 2018-07-07 NOTE — Progress Notes (Signed)
    Subjective: Procedure(s) (LRB): TRANSFORAMINAL LUMBAR INTERBODY FUSION (TLIF) L4-S1 (N/A) 1 Day Post-Op  Patient reports pain as 3 on 0-10 scale.  Reports decreased leg pain reports incisional back pain   N/A void -  Negative bowel movement Positive flatus Negative chest pain or shortness of breath  Objective: Vital signs in last 24 hours: Temp:  [97.3 F (36.3 C)-98.3 F (36.8 C)] 98.2 F (36.8 C) (08/23 0400) Pulse Rate:  [65-76] 76 (08/22 1822) Resp:  [9-19] 15 (08/22 1822) BP: (119-164)/(50-86) 164/75 (08/23 0400) SpO2:  [96 %-100 %] 99 % (08/22 2300) Weight:  [98.4 kg] 98.4 kg (08/22 0743)  Intake/Output from previous day: 08/22 0701 - 08/23 0700 In: 2414.3 [P.O.:60; I.V.:2054.3; IV Piggyback:100] Out: 2725 [Urine:2170; Drains:255; Blood:300]  Labs: No results for input(s): WBC, RBC, HCT, PLT in the last 72 hours. No results for input(s): NA, K, CL, CO2, BUN, CREATININE, GLUCOSE, CALCIUM in the last 72 hours. No results for input(s): LABPT, INR in the last 72 hours.  Physical Exam: Neurologically intact ABD soft Intact pulses distally Dorsiflexion/Plantar flexion intact Incision: dressing C/D/I Compartment soft Body mass index is 30.26 kg/m.   Assessment/Plan: Patient stable  xrays n/a Continue mobilization with physical therapy Continue care  Advance diet Up with therapy  Patient remians neurologically intact. Neuropathic leg pain improved Drain output remains elevated - will continue today and re-evaluate in the AM Continue Abx while drain remains inplace PT today for ambulation Foley to be d/c'ed this AM Possible d/c Saturday or Sunday  Melina Schools, MD [facility] [phone]

## 2018-07-07 NOTE — Social Work (Signed)
CSW acknowledging consult for SNF placement, await therapy recommendations.   Alexander Mt, [place] Work [phone]

## 2018-07-07 NOTE — Evaluation (Signed)
Physical Therapy Evaluation Patient Details Name: [name] MRN: [id number] DOB: 09-07-1956 Today's Date: 07/07/2018   History of Present Illness  Pt is a 62 year old man admitted for TLIF L4-S1. PMH: DM, HTN, arthritis.  Clinical Impression  Pt admitted with/for elective lumbar surgery.  He needs min guard to minimal assist at this time.  Pt currently limited functionally due to the problems listed below.  (see problems list.)  Pt will benefit from PT to maximize function and safety to be able to get home safely with available assist.    Follow Up Recommendations No PT follow up    Equipment Recommendations  None recommended by PT    Recommendations for Other Services       Precautions / Restrictions Precautions Precautions: Back;Fall Precaution Booklet Issued: Yes (comment) Precaution Comments: reviewed back precautions with pt and wife Required Braces or Orthoses: Spinal Brace Spinal Brace: Lumbar corset;Applied in sitting position Restrictions Weight Bearing Restrictions: No      Mobility  Bed Mobility Overal bed mobility: Needs Assistance Bed Mobility: Sit to Sidelying         Sit to sidelying: Min assist General bed mobility comments: discussed bed mobility technique and pt transitioned to sidelying correctly, but needed assist with LE's  Transfers Overall transfer level: Needs assistance Equipment used: Rolling walker (2 wheeled) Transfers: Sit to/from Stand Sit to Stand: Min guard         General transfer comment: cued for hand placement and to position hips at edge of chair prior to standing  Ambulation/Gait Ambulation/Gait assistance: Min guard Gait Distance (Feet): 150 Feet Assistive device: Rolling walker (2 wheeled) Gait Pattern/deviations: Step-through pattern     General Gait Details: slower, but generally steady.  cues for posture and better use of the RW  Stairs            Wheelchair Mobility    Modified Rankin (Stroke  Patients Only)       Balance Overall balance assessment: Needs assistance   Sitting balance-Leahy Scale: Good       Standing balance-Leahy Scale: Fair Standing balance comment: can release walker in static standing                             Pertinent Vitals/Pain Pain Assessment: 0-10 Pain Score: 5  Pain Location: back Pain Descriptors / Indicators: Aching Pain Intervention(s): Monitored during session    Home Living Family/patient expects to be discharged to:: Private residence Living Arrangements: Spouse/significant other;Children(son) Available Help at Discharge: Family;Available 24 hours/day Type of Home: House Home Access: Stairs to enter Entrance Stairs-Rails: Right;Left;Can reach both Entrance Stairs-Number of Steps: 2 Home Layout: One level Home Equipment: Walker - 2 wheels;Cane - single point;Bedside commode;Shower seat;Adaptive equipment;Other (comment)(lift chair) Additional Comments: equipment belonged to pt's mother in law    Prior Function Level of Independence: Independent         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 Dominance   Dominant Hand: Right    Extremity/Trunk Assessment   Upper Extremity Assessment Upper Extremity Assessment: Overall WFL for tasks assessed    Lower Extremity Assessment Lower Extremity Assessment: Overall WFL for tasks assessed    Cervical / Trunk Assessment Cervical / Trunk Assessment: Other exceptions Cervical / Trunk Exceptions: s/p TLIF  Communication   Communication: No difficulties  Cognition Arousal/Alertness: Awake/alert Behavior During Therapy: WFL for tasks assessed/performed Overall Cognitive Status: Within Functional Limits for tasks assessed  General Comments General comments (skin integrity, edema, etc.): reinforced all back care/prec, log roll/transitions to/from sidelying, bracing issues, lifting restrictions and progression of  activity.    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 : Unable Difficulty sitting down on and standing up from a chair with arms (e.g., wheelchair, bedside commode, etc,.)?: A Little Help needed moving to and from a bed to chair (including a wheelchair)?: A Little Help needed walking in hospital room?: A Little Help needed climbing 3-5 steps with a railing? : A Little 6 Click Score: 16    End of Session   Activity Tolerance: Patient tolerated treatment well Patient left: in bed;with call bell/phone within reach;with family/visitor present Nurse Communication: Mobility status PT Visit Diagnosis: Other abnormalities of gait and mobility (R26.89);Pain Pain - part of body: (back)    Time: 6270-3500 PT Time Calculation (min) (ACUTE ONLY): 26 min   Charges:   PT Evaluation $PT Eval Low Complexity: 1 Low PT Treatments $Gait Training: 8-22 mins        07/07/2018  Donnella Sham, PT [phone]  (pager)  [name] 07/07/2018, 3:41 PM

## 2018-07-07 NOTE — Anesthesia Postprocedure Evaluation (Signed)
Anesthesia Post Note  Patient: [name]  Procedure(s) Performed: TRANSFORAMINAL LUMBAR INTERBODY FUSION (TLIF) L4-S1 (N/A )     Patient location during evaluation: PACU Anesthesia Type: General Level of consciousness: awake and alert Pain management: pain level controlled Vital Signs Assessment: post-procedure vital signs reviewed and stable Respiratory status: spontaneous breathing, nonlabored ventilation and respiratory function stable Cardiovascular status: blood pressure returned to baseline and stable Postop Assessment: no apparent nausea or vomiting Anesthetic complications: no    Last Vitals:  Vitals:   07/06/18 2300 07/07/18 0400  BP: (!) 159/62 (!) 164/75  Pulse:    Resp:    Temp: 36.8 C 36.8 C  SpO2: 99%     Last Pain:  Vitals:   07/07/18 0534  TempSrc:   PainSc: [place]

## 2018-07-07 NOTE — Progress Notes (Signed)
Pt ambulates very well, brace has to be tight and makes all of the difference. Slightly light headed at first and then it goes away fairly quickly (<1 minute). Patient uses a walker.   1. Ambulated with Nurse 2. Ambulated with wife for hygiene and bathroom purposes. 3. Ambulated with Yvone Neu from PT  Foley removed first thing this morning.   Patient has voided in urinal and in the toilet as well as a bowel movement.   Patient diet changed to regular and has eaten 100% of his meals.   Drinks a lot of water.  Moderate pain today.

## 2018-07-07 NOTE — Evaluation (Signed)
Occupational Therapy Evaluation Patient Details Name: [name] MRN: [id number] DOB: March 04, 1956 Today's Date: 07/07/2018    History of Present Illness Pt is a 62 year old man admitted for TLIF L4-S1. PMH: DM, HTN, arthritis.   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 Reports moderate back pain. Pt requires min guard assist for mobility and min assist for ADL, specifically for LB bathing and dressing. Pt is well equipped at home with DME and AE. Will follow acutely to educate in use of AE and reinforce back precautions. Do not anticipate pt will require post acute OT.    Follow Up Recommendations  No OT follow up    Equipment Recommendations  None recommended by OT    Recommendations for Other Services       Precautions / Restrictions Precautions Precautions: Back;Fall Precaution Booklet Issued: Yes (comment) Precaution Comments: reviewed back precautions with pt and wife Required Braces or Orthoses: Spinal Brace Spinal Brace: Lumbar corset;Applied in sitting position Restrictions Weight Bearing Restrictions: No      Mobility Bed Mobility               General bed mobility comments: pt received in chair, verbally educated in log roll technique and positioning with pillow under or between knees  Transfers Overall transfer level: Needs assistance Equipment used: Rolling walker (2 wheeled) Transfers: Sit to/from Stand Sit to Stand: Min guard         General transfer comment: cued for hand placement and to position hips at edge of chair prior to standing    Balance Overall balance assessment: Needs assistance   Sitting balance-Leahy Scale: Good       Standing balance-Leahy Scale: Fair Standing balance comment: can release walker in static standing                           ADL either performed or assessed with clinical judgement   ADL Overall ADL's : Needs  assistance/impaired Eating/Feeding: Independent;Sitting   Grooming: Wash/dry hands;Standing;Min guard Grooming Details (indicate cue type and reason): educated in 2 cup method for toothbrushing and use of washcloth for washing face at sink Upper Body Bathing: Set up;Sitting Upper Body Bathing Details (indicate cue type and reason): recommended use of long bath sponge for back Lower Body Bathing: Minimal assistance;Sit to/from stand Lower Body Bathing Details (indicate cue type and reason): recommended long handled bath sponge for feet, reacher to dry Upper Body Dressing : Set up;Sitting Upper Body Dressing Details (indicate cue type and reason): educated in proper placement of lumbar corsett Lower Body Dressing: Minimal assistance;Sit to/from stand Lower Body Dressing Details (indicate cue type and reason): pt unable to cross foot over opposite knee, has availability of AE Toilet Transfer: Min guard;Ambulation;RW;BSC(over toilet) Toilet Transfer Details (indicate cue type and reason): wife is aware that 3 in 1 can be used over toilet Toileting- Clothing Manipulation and Hygiene: Minimal assistance;Sit to/from stand Toileting - Clothing Manipulation Details (indicate cue type and reason): instructed to avoid twisting with pericare, use of tongs and wet wipes if necessary Tub/ Shower Transfer: Copy Details (indicate cue type and reason): demonstrated technique with RW, will use a shower seat Functional mobility during ADLs: Min guard;Rolling walker       Vision Patient Visual Report: No change from baseline       Perception     Praxis      Pertinent Vitals/Pain Pain  Assessment: 0-10 Pain Score: 5  Pain Location: back Pain Descriptors / Indicators: Aching Pain Intervention(s): Monitored during session;Premedicated before session;Repositioned     Hand Dominance Right   Extremity/Trunk Assessment Upper Extremity Assessment Upper Extremity  Assessment: Overall WFL for tasks assessed   Lower Extremity Assessment Lower Extremity Assessment: Defer to PT evaluation   Cervical / Trunk Assessment Cervical / Trunk Assessment: Other exceptions Cervical / Trunk Exceptions: s/p TLIF   Communication Communication Communication: No difficulties   Cognition Arousal/Alertness: Awake/alert Behavior During Therapy: WFL for tasks assessed/performed Overall Cognitive Status: Within Functional Limits for tasks assessed                                     General Comments       Exercises     Shoulder Instructions      Home Living Family/patient expects to be discharged to:: Private residence Living Arrangements: Spouse/significant other;Children(son) Available Help at Discharge: Family;Available 24 hours/day Type of Home: House Home Access: Stairs to enter [employer] of Steps: 2 Entrance Stairs-Rails: Right;Left;Can reach both Home Layout: One level     Bathroom Shower/Tub: Occupational psychologist: Handicapped height     Home Equipment: Environmental consultant - 2 wheels;Cane - single point;Bedside commode;Shower seat;Adaptive equipment;Other (comment)(lift chair) Adaptive Equipment: Reacher;Sock aid;Long-handled sponge Additional Comments: equipment belonged to pt's mother in law      Prior Functioning/Environment Level of Independence: Independent        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        OT Problem List: Impaired balance (sitting and/or standing);Decreased knowledge of use of DME or AE;Decreased knowledge of precautions;Increased edema      OT Treatment/Interventions: Self-care/ADL training;DME and/or AE instruction;Patient/family education    OT Goals(Current goals can be found in the care plan section) Acute Rehab OT Goals 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 OT Goal Formulation: With patient Time For Goal Achievement: 07/21/18 Potential to Achieve Goals: Good  OT Frequency: Min  2X/week   Barriers to D/C:            Co-evaluation              AM-PAC PT "6 Clicks" Daily Activity     Outcome Measure Help from another person eating meals?: None Help from another person taking care of personal grooming?: A Little Help from another person toileting, which includes using toliet, bedpan, or urinal?: A Little Help from another person bathing (including washing, rinsing, drying)?: A Little Help from another person to put on and taking off regular upper body clothing?: None Help from another person to put on and taking off regular lower body clothing?: A Little 6 Click Score: 20   End of Session Equipment Utilized During Treatment: Gait belt;Rolling walker;Back brace  Activity Tolerance: Patient tolerated treatment well Patient left: in chair;with call bell/phone within reach;with family/visitor present  OT Visit Diagnosis: Other abnormalities of gait and mobility (R26.89);Pain                Time: 1222-1258 OT Time Calculation (min): 36 min Charges:  OT General Charges $OT Visit: 1 Visit OT Evaluation $OT Eval Moderate Complexity: 1 Mod OT Treatments $Self Care/Home Management : 8-22 mins  07/07/2018 Nestor Lewandowsky, OTR/L Pager: [phone]  Werner Lean, [place] 07/07/2018, 1:07 PM

## 2018-07-08 LAB — GLUCOSE, CAPILLARY
Glucose-Capillary: 140 mg/dL — ABNORMAL HIGH (ref 70–99)
Glucose-Capillary: 198 mg/dL — ABNORMAL HIGH (ref 70–99)
Glucose-Capillary: 207 mg/dL — ABNORMAL HIGH (ref 70–99)
Glucose-Capillary: 261 mg/dL — ABNORMAL HIGH (ref 70–99)

## 2018-07-08 NOTE — Discharge Summary (Addendum)
Orthopedic Discharge Summary        Physician Discharge Summary  Patient ID: [name] MRN: [id number] DOB/AGE: July 06, 1956 62 y.o.  Admit date: 07/06/2018 Discharge date: 07/08/2018   Procedures:  Procedure(s) (LRB): TRANSFORAMINAL LUMBAR INTERBODY FUSION (TLIF) L4-S1 (N/A)  Attending Physician:  Dr. Melina Schools  Admission Diagnoses:   Lumbar stenosis  Discharge Diagnoses:  Lumbar stenosis   Past Medical History:  Diagnosis Date  . Chronic kidney disease   . Complication of anesthesia    Hard to wake  . Degenerative joint disease (DJD) of lumbar spine   . Diabetes mellitus without complication ([place])    Type II  . Hypertension   . Lumbar stenosis   . Proteinuria   . Radicular leg pain     PCP: Terald Sleeper, PA-C   Discharged Condition: good  Hospital Course:  Patient underwent the above stated procedure on 07/06/2018. Patient tolerated the procedure well and brought to the recovery room in good condition and subsequently to the floor. Patient had an uncomplicated hospital course and was stable for discharge.   Disposition: Discharge disposition: 01-Home or Self Care      with follow up in 2 weeks   Follow-up [place], Dahari, MD Follow up in 2 week(s).   Specialty:  Orthopedic Surgery Contact information: [address] [place] [address] [phone]           Discharge Instructions    Call MD / Call 911   Complete by:  As directed    If you experience chest pain or shortness of breath, CALL 911 and be transported to the hospital emergency room.  If you develope a fever above 101 F, pus (white drainage) or increased drainage or redness at the wound, or calf pain, call your surgeon's office.   Constipation Prevention   Complete by:  As directed    Drink plenty of fluids.  Prune juice may be helpful.  You may use a stool softener, such as Colace (over the counter) 100 mg twice a day.  Use MiraLax (over  the counter) for constipation as needed.   Diet - low sodium heart healthy   Complete by:  As directed    Driving restrictions   Complete by:  As directed    No driving for 2 weeks   Incentive spirometry RT   Complete by:  As directed    Increase activity slowly as tolerated   Complete by:  As directed    Lifting restrictions   Complete by:  As directed    No lifting for 2 weeks      Allergies as of 07/08/2018   No Known Allergies     Medication List    STOP taking these medications   HYDROcodone-homatropine 5-1.5 MG/5ML syrup Commonly known as:  HYCODAN   loratadine 10 MG tablet Commonly known as:  CLARITIN   mupirocin cream 2 % Commonly known as:  BACTROBAN     TAKE these medications   amLODipine 10 MG tablet Commonly known as:  NORVASC Take 1 tablet (10 mg total) by mouth daily.   canagliflozin 100 MG Tabs tablet Commonly known as:  INVOKANA TAKE (1) TABLET DAILY BE- FORE BREAKFAST. What changed:    how much to take  how to take this  when to take this  additional instructions   cloNIDine 0.3 MG tablet Commonly known as:  CATAPRES Take 1 tablet (0.3 mg total) by mouth 2 (two) times daily.  finasteride 5 MG tablet Commonly known as:  PROSCAR Take 1 tablet (5 mg total) by mouth daily.   metFORMIN 500 MG 24 hr tablet Commonly known as:  GLUCOPHAGE-XR Take 1 tablet (500 mg total) by mouth daily with breakfast.   methocarbamol 500 MG tablet Commonly known as:  ROBAXIN Take 1 tablet (500 mg total) by mouth 3 (three) times daily.   ondansetron 4 MG disintegrating tablet Commonly known as:  ZOFRAN-ODT Take 1 tablet (4 mg total) by mouth every 8 (eight) hours as needed for nausea or vomiting.   oxyCODONE-acetaminophen 10-325 MG tablet Commonly known as:  PERCOCET Take 1 tablet by mouth every 4 (four) hours as needed for up to 5 days for pain.   spironolactone 25 MG tablet Commonly known as:  ALDACTONE Take 1 tablet (25 mg total) by mouth daily.    tamsulosin 0.4 MG Caps capsule Commonly known as:  FLOMAX TAKE 1 CAPSULE ONCE DAILY         Signed: Fain Francis,STEVEN R 07/08/2018, 11:34 AM  [facility] is now [facility] [address] [place], [place], [place] [address] Phone: [place]

## 2018-07-08 NOTE — Progress Notes (Signed)
Orthopedics Progress Note  Subjective: Patient reports that pain is controlled and he would like to go home today. He has had a BM.   Objective:  Vitals:   07/08/18 0300 07/08/18 0817  BP: 133/67 139/68  Pulse: 76 78  Resp:    Temp: 99.2 F (37.3 C) 99.1 F (37.3 C)  SpO2: 95% 96%    General: Awake and alert  Musculoskeletal: Lumbar drain pulled and dressing reinforced after drain pull. No erythema and no drainage.  Neurovascularly intact  Lab Results  Component Value Date   WBC 7.5 06/28/2018   HGB 14.6 06/28/2018   HCT 41.2 06/28/2018   MCV 93.0 06/28/2018   PLT 174 06/28/2018       Component Value Date/Time   NA 138 06/28/2018 1321   NA 140 02/28/2018 1149   K 4.6 06/28/2018 1321   CL 102 06/28/2018 1321   CO2 26 06/28/2018 1321   GLUCOSE 317 (H) 06/28/2018 1321   BUN 15 06/28/2018 1321   BUN 11 02/28/2018 1149   CREATININE 1.14 06/28/2018 1321   CALCIUM 9.7 06/28/2018 1321   GFRNONAA >60 06/28/2018 1321   GFRAA >60 06/28/2018 1321    No results found for: INR, PROTIME  Assessment/Plan: POD #2 s/p Procedure(s): TRANSFORAMINAL LUMBAR INTERBODY FUSION (TLIF) L4-S1 Discharge to home   [place]. Veverly Fells, MD 07/08/2018 11:31 AM

## 2018-07-08 NOTE — Progress Notes (Signed)
Pt being d/c home vitals stable. Pt and wife  demonstrates understanding of AVS and prescriptions. Pt being taken home by wife.

## 2018-07-08 NOTE — Progress Notes (Signed)
Physical Therapy Treatment Patient Details Name: [name] MRN: [id number] DOB: 05/16/1956 Today's Date: 07/08/2018    History of Present Illness Pt is a 62 year old man admitted for TLIF L4-S1. PMH: DM, HTN, arthritis.    PT Comments    Pt was able to progress gait down the hallway, supervision overall, min assist on the stairs, but pt will have two rails at home and we only had one to practice today.  Wife present for mobility and education.  Pt would like to go home today if possible and I think from a mobility standpoint that would be fine.  PT to follow acutely until d/c confirmed.      Follow Up Recommendations  No PT follow up     Equipment Recommendations  None recommended by PT    Recommendations for Other Services   NA     Precautions / Restrictions Precautions Precautions: Back;Fall Precaution Booklet Issued: Yes (comment) Precaution Comments: reviewed back precautions with pt and wife, pt able to state 3/3 Required Braces or Orthoses: Spinal Brace Spinal Brace: Lumbar corset;Applied in sitting position    Mobility  Bed Mobility               General bed mobility comments: Pt was in the bathroom with his wife when I entered the room.   Transfers Overall transfer level: Needs assistance Equipment used: Rolling walker (2 wheeled) Transfers: Sit to/from Stand Sit to Stand: Supervision         General transfer comment: supervision for safety  Ambulation/Gait Ambulation/Gait assistance: Supervision Gait Distance (Feet): 200 Feet Assistive device: Rolling walker (2 wheeled) Gait Pattern/deviations: Step-through pattern Gait velocity: decreased Gait velocity interpretation: 1.31 - 2.62 ft/sec, indicative of limited community ambulator General Gait Details: Slow, yet steady gait pattern, upright posture even with RW use, light hands on RW handles.   Stairs Stairs: Yes Stairs assistance: Min assist Stair Management: One rail Right;One  rail Left;Step to pattern;Forwards Number of Stairs: 3 General stair comments: Min hand held assist and one rail.  At home pt has two rails he can reach at once.  We figured out it was easier to go up with the left leg leading as the R leg is functionally a bit weaker.  It did not make a difference which leg he led down with.  Min assist to simulate second rail. Educated wife on standing down the steps (behind him when going up , slightly in front when coming down. )        Balance Overall balance assessment: Needs assistance Sitting-balance support: Feet supported Sitting balance-Leahy Scale: Good     Standing balance support: No upper extremity supported Standing balance-Leahy Scale: Fair                              Cognition Arousal/Alertness: Awake/alert Behavior During Therapy: WFL for tasks assessed/performed Overall Cognitive Status: Within Functional Limits for tasks assessed                                        Exercises Other Exercises Other Exercises: IS x 10 with greater than 2,000 mL inspired volume.         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 : A Little Difficulty sitting down on and standing up from a chair with arms (e.g., wheelchair, bedside  commode, etc,.)?: Unable Help needed moving to and from a bed to chair (including a wheelchair)?: None Help needed walking in hospital room?: None Help needed climbing 3-5 steps with a railing? : A Little 6 Click Score: 18    End of Session Equipment Utilized During Treatment: Back brace Activity Tolerance: Patient tolerated treatment well Patient left: in chair;with call bell/phone within reach;with family/visitor present   PT Visit Diagnosis: Other abnormalities of gait and mobility (R26.89);Pain Pain - Right/Left: (incisional) Pain - part of body: (back)     Time: 1001-1031 PT Time Calculation (min) (ACUTE ONLY): 30 min  Charges:  $Gait Training: 23-37 mins          Marne Meline B. McB[place], [place], DPT [phone]            07/08/2018, 10:37 AM

## 2018-07-08 NOTE — Plan of Care (Signed)
  Problem: Safety: Goal: Ability to remain free from injury will improve Outcome: Progressing   Problem: Education: Goal: Ability to verbalize activity precautions or restrictions will improve Outcome: Progressing   Problem: Education: Goal: Knowledge of the prescribed therapeutic regimen will improve Outcome: Progressing   Problem: Activity: Goal: Ability to avoid complications of mobility impairment will improve Outcome: Progressing   Problem: Activity: Goal: Ability to tolerate increased activity will improve Outcome: Progressing   Problem: Activity: Goal: Will remain free from falls Outcome: Progressing   Problem: Skin Integrity: Goal: Will show signs of wound healing Outcome: Progressing   Problem: Health Behavior/Discharge Planning: Goal: Identification of resources available to assist in meeting health care needs will improve Outcome: Progressing

## 2018-07-11 ENCOUNTER — Other Ambulatory Visit: Payer: Self-pay

## 2018-07-11 NOTE — Patient Outreach (Addendum)
[facility]) Care Management  [place]   07/11/2018  Nicholas Trompeter 1956/01/10 [id number]  62 year old male outreached by [facility] services for a 30 day post discharge medication review.  PMHx includes, but not limited to, hypertension, Type 2 diabetes mellitus, BPH and hyperlipidemia.  Successful outreach attempt to Mr. Bautch, who asked me to speak with his wife, Fraser Din.  HIPAA identifiers verified.  Subjective: Mrs. Weyenberg reports that her husband is not in a "terrible amount of pain" after his recent spinal fusion.   She states that he has only taken one Percocet and that the muscle relaxer helps more.  She states that he checks his sugars daily with the values usually >200mg /dL.  She states that his Anastasio Auerbach is expensive and that he is in the donut hole.  Mrs. Novack states that they have not spent enough out of pocket on prescriptions to qualify for patient assistance because he has received samples.   She declines [facility] RN outreach for diabetes coaching.  Objective: labs on 06/28/18 HgA1c 7.9%  SCr 1.14mg /dL   Current Medications: Current Outpatient Medications  Medication Sig Dispense Refill  . amLODipine (NORVASC) 10 MG tablet Take 1 tablet (10 mg total) by mouth daily. 90 tablet 3  . canagliflozin (INVOKANA) 100 MG TABS tablet TAKE (1) TABLET DAILY BE- FORE BREAKFAST. (Patient taking differently: Take 100 mg by mouth daily before breakfast. ) 90 tablet 3  . cloNIDine (CATAPRES) 0.3 MG tablet Take 1 tablet (0.3 mg total) by mouth 2 (two) times daily. 60 tablet 2  . finasteride (PROSCAR) 5 MG tablet Take 1 tablet (5 mg total) by mouth daily. 30 tablet 11  . metFORMIN (GLUCOPHAGE-XR) 500 MG 24 hr tablet Take 1 tablet (500 mg total) by mouth daily with breakfast. 90 tablet 0  . methocarbamol (ROBAXIN) 500 MG tablet Take 1 tablet (500 mg total) by mouth 3 (three) times daily. 30 tablet 0  . ondansetron (ZOFRAN ODT) 4 MG disintegrating tablet Take 1 tablet  (4 mg total) by mouth every 8 (eight) hours as needed for nausea or vomiting. 20 tablet 0  . oxyCODONE-acetaminophen (PERCOCET) 10-325 MG tablet Take 1 tablet by mouth every 4 (four) hours as needed for up to 5 days for pain. 20 tablet 0  . spironolactone (ALDACTONE) 25 MG tablet Take 1 tablet (25 mg total) by mouth daily. 30 tablet 5  . tamsulosin (FLOMAX) 0.4 MG CAPS capsule TAKE 1 CAPSULE ONCE DAILY (Patient not taking: No sig reported) 90 capsule 0   No current facility-administered medications for this visit.     Functional Status: In your present state of health, do you have any difficulty performing the following activities: 06/28/2018 06/13/2018  Hearing? N N  Vision? N N  Difficulty concentrating or making decisions? N N  Walking or climbing stairs? Y N  Dressing or bathing? N N  Doing errands, shopping? N N  Preparing Food and eating ? - N  Using the Toilet? - N  In the past six months, have you accidently leaked urine? - N  Do you have problems with loss of bowel control? - N  Managing your Medications? - N  Managing your Finances? - N  Housekeeping or managing your Housekeeping? - N  Some recent data might be hidden    Fall/Depression Screening: Fall Risk  06/13/2018 05/31/2018 08/25/2017  Falls in the past year? No No No   PHQ 2/9 Scores 06/13/2018 05/31/2018 05/15/2018 05/08/2018 02/28/2018 11/28/2017 08/25/2017  PHQ -  2 Score 1 3 3 3 3 1 3   PHQ- 9 Score - 6 6 6 9  - 9   ASSESSMENT: Date Discharged from Hospital: 07/08/18 Date Medication Reconciliation Performed: 07/11/2018  Medications Discontinued at Discharge:  Hydrocodone/homatropine syrup  Loratadine  Mupirocin cream  New Medications at Discharge:  Methocarbamol  Ondansetron  Oxycodone/APAP  Patient was recently discharged from hospital and all medications have been reviewed  Drugs sorted by system:  Cardiovascular: amlodipine, clonidine, spironolactone  Gastrointestinal: ondansetron  Endocrine:  canagliflozin, metformin  Pain: oxycodone/APAP  Miscellaneous: finasteride, methocarbamol, tamsulosin   Gaps in therapy:  Patient with diabetes mellitus and not on an ACEI or ARB. Patient also not on statin therapy.  Please consider adding.   Drug interactions:  Canagliflozin may enhance the hyperkalemic effect and the hypotensive of spironolactone.   Other issues noted:  Reviewed desired HgA1c and glucose levels.  Wife reports that he doesn't eat "right" and eats before he goes to bed.  They declined [facility] diabetes health coaching.  Plan: Route note to PCP, A. Ronnald Ramp PA.  Route note to nephrology, Otelia Santee Huetter, [place] [phone]

## 2018-07-12 ENCOUNTER — Encounter (HOSPITAL_COMMUNITY): Payer: Self-pay | Admitting: Orthopedic Surgery

## 2018-07-27 ENCOUNTER — Other Ambulatory Visit: Payer: Self-pay

## 2018-07-27 NOTE — Patient Outreach (Signed)
[facility] [facility]) Care Management  07/27/2018  [name] December 26, 1955 [id number]  Medication review completed.  Will close [place] case.  Joetta Manners, PharmD Clinical Pharmacist [place] [phone]

## 2018-07-31 DIAGNOSIS — E559 Vitamin D deficiency, unspecified: Secondary | ICD-10-CM | POA: Diagnosis not present

## 2018-07-31 DIAGNOSIS — Z79899 Other long term (current) drug therapy: Secondary | ICD-10-CM | POA: Diagnosis not present

## 2018-07-31 DIAGNOSIS — N183 Chronic kidney disease, stage 3 (moderate): Secondary | ICD-10-CM | POA: Diagnosis not present

## 2018-07-31 DIAGNOSIS — D509 Iron deficiency anemia, unspecified: Secondary | ICD-10-CM | POA: Diagnosis not present

## 2018-07-31 DIAGNOSIS — I10 Essential (primary) hypertension: Secondary | ICD-10-CM | POA: Diagnosis not present

## 2018-07-31 DIAGNOSIS — R809 Proteinuria, unspecified: Secondary | ICD-10-CM | POA: Diagnosis not present

## 2018-08-02 ENCOUNTER — Other Ambulatory Visit: Payer: PPO

## 2018-08-07 ENCOUNTER — Other Ambulatory Visit: Payer: Self-pay | Admitting: Physician Assistant

## 2018-08-07 DIAGNOSIS — E1122 Type 2 diabetes mellitus with diabetic chronic kidney disease: Secondary | ICD-10-CM

## 2018-08-07 DIAGNOSIS — N182 Chronic kidney disease, stage 2 (mild): Principal | ICD-10-CM

## 2018-08-09 DIAGNOSIS — R809 Proteinuria, unspecified: Secondary | ICD-10-CM | POA: Diagnosis not present

## 2018-08-09 DIAGNOSIS — N182 Chronic kidney disease, stage 2 (mild): Secondary | ICD-10-CM | POA: Diagnosis not present

## 2018-08-09 DIAGNOSIS — E871 Hypo-osmolality and hyponatremia: Secondary | ICD-10-CM | POA: Diagnosis not present

## 2018-08-09 DIAGNOSIS — E669 Obesity, unspecified: Secondary | ICD-10-CM | POA: Diagnosis not present

## 2018-08-18 DIAGNOSIS — M5136 Other intervertebral disc degeneration, lumbar region: Secondary | ICD-10-CM | POA: Diagnosis not present

## 2018-08-18 DIAGNOSIS — Z4889 Encounter for other specified surgical aftercare: Secondary | ICD-10-CM | POA: Diagnosis not present

## 2018-08-28 ENCOUNTER — Encounter: Payer: Self-pay | Admitting: Physical Therapy

## 2018-08-28 ENCOUNTER — Ambulatory Visit: Payer: PPO | Attending: Physician Assistant | Admitting: Physical Therapy

## 2018-08-28 ENCOUNTER — Other Ambulatory Visit: Payer: Self-pay

## 2018-08-28 DIAGNOSIS — M6281 Muscle weakness (generalized): Secondary | ICD-10-CM

## 2018-08-28 DIAGNOSIS — G8929 Other chronic pain: Secondary | ICD-10-CM | POA: Diagnosis not present

## 2018-08-28 DIAGNOSIS — M545 Low back pain: Secondary | ICD-10-CM | POA: Insufficient documentation

## 2018-08-28 NOTE — Therapy (Signed)
[facility] [place], [place], [address] Phone: [phone]   Fax:  [phone]  Physical Therapy Evaluation  Patient Details  Name: [name] MRN: [id number] Date of Birth: Mar 16, 1956 Referring Provider (PT): Ronette Deter, [place]   Encounter Date: 08/28/2018  PT End of Session - 08/28/18 1354    Visit Number  1    Number of Visits  12    Date for PT Re-Evaluation  10/16/18    Authorization Type  FOTO every 5th visit; progress note every 10th visit;    PT Start Time  1300    PT Stop Time  1343    PT Time Calculation (min)  43 min    Activity Tolerance  Patient tolerated treatment well    Behavior During Therapy  [facility] for tasks assessed/performed       Past Medical History:  Diagnosis Date  . Chronic kidney disease   . Complication of anesthesia    Hard to wake  . Degenerative joint disease (DJD) of lumbar spine   . Diabetes mellitus without complication ([place])    Type II  . Hypertension   . Lumbar stenosis   . Proteinuria   . Radicular leg pain     Past Surgical History:  Procedure Laterality Date  . BELPHAROPTOSIS REPAIR    . CARDIAC CATHETERIZATION    . EYE SURGERY    . HEMORRHOID SURGERY    . TRANSFORAMINAL LUMBAR INTERBODY FUSION (TLIF) WITH PEDICLE SCREW FIXATION 2 LEVEL N/A 07/06/2018   Procedure: TRANSFORAMINAL LUMBAR INTERBODY FUSION (TLIF) L4-S1;  Surgeon: Melina Schools, MD;  Location: [place];  Service: Orthopedics;  Laterality: N/A;    There were no vitals filed for this visit.   Subjective Assessment - 08/28/18 1946    Subjective  Patient arrives to physical therapy with reports ongoing back pain secondary to a transforaminal lumbar interbody fusion on 07/06/18. Patient reports having physical therapy in the hospital which consisted of walking down the hallway and negotiating steps. Patient reports he is not allowed to bend or lift and states he wears the brace throughout the day except  while sleeping. Patient has been able to perform most ADLs with respect to protocol but states he cannot put shoes on without wife's assistance. 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 Patient stated he has been cleared to do so with respect to pain levels by PA at most recent follow up visit. Patient reports pain at worst is 5/10 with increased activity and pain at best is 0/10 with rest. Patient's goals are to decrease pain, improve movement, improve strength, improve ability to perform work activities and stand longer than 1 hour.    Pertinent History  TLIF L4-S1 07/06/18, DM, HTN    Limitations  House hold activities;Walking;Standing    How long can you walk comfortably?       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         [facility] PT Assessment - 08/28/18 0001      Assessment   Medical Diagnosis  Transforaminal lumbar interbody fusion, encounter for other specified surgical after care    Referring Provider (PT)  Ronette Deter, PA-C    Onset Date/Surgical Date  07/06/18    Next MD Visit  November 2019    Prior Therapy  yes      Precautions   Precautions  Back    Precaution Comments  Fusion protocol; no bending, lifting, twisting, arching    Required Braces or Orthoses  Spinal Brace      Restrictions   Weight Bearing Restrictions  No      Balance Screen   Has the patient fallen in the past 6 months  No     Has the patient had a decrease in activity level because of a fear of falling?   No    Is the patient reluctant to leave their home because of a fear of falling?   No      Home Environment   Living Environment  Private residence    Living Arrangements  Spouse/significant other    Type of [place] Access  Stairs to enter    Entrance Stairs-Number of Steps  3      Prior Function   Level of Independence  Independent with basic ADLs      Observation/Other Assessments   Skin Integrity  incisions are dry with minimal scabbing on each 3 incisions.    Focus on Therapeutic Outcomes (FOTO)   48% limited      Sensation   Light Touch  Appears Intact      ROM / Strength   AROM / PROM / Strength  Strength      Strength   Strength Assessment Site  Hip;Knee    Right/Left Hip  Right;Left    Right Hip ABduction  3+/5   tested in supine   Left Hip ABduction  3+/5    Left Hip ADduction  --   tested in supine   Right/Left Knee  Right;Left    Right Knee Flexion  4/5    Right Knee Extension  4+/5    Left Knee Flexion  4/5    Left Knee Extension  4+/5      Flexibility   Soft Tissue Assessment /Muscle Length  yes    Hamstrings  WFL      Palpation   Palpation comment  Minimal tenderness to palpation along lumbar paraspinals      Bed Mobility   Bed Mobility  --   independent log rolling bed mobility;      Transfers   Transfers  Independent with all Transfers      Ambulation/Gait   Gait Pattern  Step-through pattern;Wide base of support;Decreased trunk rotation                Objective measurements completed on examination: See above findings.              PT Education - 08/28/18 1954    Education Details  Draw in, draw in with marching, hip adduction isometric,  standing  hip abduction and HR    Person(s) Educated  Patient    Methods  Explanation;Demonstration;Handout    Comprehension  Verbalized understanding;Returned demonstration          PT  Long Term Goals - 08/28/18 2004      PT LONG TERM GOAL #1   Title  Patient will be independent with HEP and its progression    Time  6    Period  Weeks    Status  New      PT LONG TERM GOAL #2   Title  Patient will demonstrate 4+/5 or greater LE strength to improve ability to perform functional activities.    Time  6    Period  Weeks    Status  New      PT LONG TERM GOAL #3   Title  Patient will report ability to stand for 30 minutes or greater with pain less than or equal to 3/10 to perform work activities.    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     Time  6    Period  Weeks    Status  New             Plan - 08/28/18 1949    Clinical Impression Statement  Patient is a 63 year old male who presents to physical therapy with low back pain and decreased bilateral LE strength secondary to TLIF on 07/06/18. Patient demonstrated independence with donning and doffing brace. Patient also demonstrated independent bed mobility with proper log roll technique to protect his back. Patient was able to recite precautions with no cuing. Patient's incisions dry with minimal scabbing on each. Patient reported minimal tenderness to palpation to lumbar paraspinals. Patient would benefit from skilled physical therapy to address deficits and address patient's goals.       History and Personal Factors relevant to plan of care:  TLIF L4-S1 07/06/18, DM, HTN     Clinical Presentation  Stable    Clinical Decision Making  Low    Rehab Potential  Good    PT Frequency  2x / week    PT Duration  6 weeks    PT Treatment/Interventions  ADLs/Self Care Home Management;Cryotherapy;Electrical Stimulation;Moist Heat;Therapeutic activities;Therapeutic exercise;Patient/family education;Manual techniques;Neuromuscular re-education;Passive range of motion;Taping;Balance  training;Functional mobility training;Stair training;Gait training    PT Next Visit Plan  Nustep, LE strengthening core stabilization, modalities PRN for pain relief.     PT Home Exercise Plan  see patient education section     Consulted and Agree with Plan of Care  Patient       Patient will benefit from skilled therapeutic intervention in order to improve the following deficits and impairments:  Postural dysfunction, Pain, Decreased strength, Decreased activity tolerance, Decreased endurance, Decreased range of motion  Visit Diagnosis: Chronic low back pain, unspecified back pain laterality, unspecified whether sciatica present - Plan: PT plan of care cert/re-cert  Muscle weakness (generalized) - Plan: PT plan of care cert/re-cert     Problem List Patient Active Problem List   Diagnosis Date Noted  . S/P lumbar fusion 07/06/2018  . DDD (degenerative disc disease), lumbar 02/28/2018  . 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 Essential hypertension 02/23/2017  . Benign prostatic hyperplasia with incomplete bladder emptying 02/23/2017  . Vitamin D deficiency 02/23/2017  . Hyperlipidemia associated with type 2 diabetes mellitus (  [place]) 02/23/2017  . Type 2 diabetes mellitus with stage 2 chronic kidney disease, without long-term current use of insulin ([place]) 02/23/2017   Gabriela Eves, PT, DPT 08/28/2018, 8:11 PM  [facility] [place], [place], [address] Phone: [phone]   Fax:  [phone]  Name: [name] MRN: [id number] Date of Birth: 1956/09/02

## 2018-09-01 ENCOUNTER — Encounter: Payer: Self-pay | Admitting: Physician Assistant

## 2018-09-01 ENCOUNTER — Ambulatory Visit (INDEPENDENT_AMBULATORY_CARE_PROVIDER_SITE_OTHER): Payer: PPO | Admitting: Physician Assistant

## 2018-09-01 VITALS — BP 117/74 | HR 81 | Temp 98.8°F | Ht 71.0 in | Wt 208.8 lb

## 2018-09-01 DIAGNOSIS — I10 Essential (primary) hypertension: Secondary | ICD-10-CM

## 2018-09-01 DIAGNOSIS — G4733 Obstructive sleep apnea (adult) (pediatric): Secondary | ICD-10-CM | POA: Insufficient documentation

## 2018-09-01 DIAGNOSIS — Z9989 Dependence on other enabling machines and devices: Secondary | ICD-10-CM | POA: Diagnosis not present

## 2018-09-01 DIAGNOSIS — E1122 Type 2 diabetes mellitus with diabetic chronic kidney disease: Secondary | ICD-10-CM | POA: Diagnosis not present

## 2018-09-01 DIAGNOSIS — N401 Enlarged prostate with lower urinary tract symptoms: Secondary | ICD-10-CM | POA: Diagnosis not present

## 2018-09-01 DIAGNOSIS — N182 Chronic kidney disease, stage 2 (mild): Secondary | ICD-10-CM

## 2018-09-01 DIAGNOSIS — Z23 Encounter for immunization: Secondary | ICD-10-CM

## 2018-09-01 DIAGNOSIS — R3914 Feeling of incomplete bladder emptying: Secondary | ICD-10-CM

## 2018-09-01 LAB — BAYER DCA HB A1C WAIVED: HB A1C (BAYER DCA - WAIVED): 9.4 % — ABNORMAL HIGH (ref <7.0)

## 2018-09-01 MED ORDER — METFORMIN HCL ER 500 MG PO TB24: 500.0000 mg | ORAL_TABLET | Freq: Every day | ORAL | 3 refills | Status: DC

## 2018-09-01 MED ORDER — IRBESARTAN 300 MG PO TABS: ORAL_TABLET | ORAL | 3 refills | Status: DC

## 2018-09-01 MED ORDER — CANAGLIFLOZIN 100 MG PO TABS: ORAL_TABLET | ORAL | 3 refills | Status: DC

## 2018-09-01 MED ORDER — SPIRONOLACTONE 25 MG PO TABS: 25.0000 mg | ORAL_TABLET | Freq: Every day | ORAL | 3 refills | Status: DC

## 2018-09-01 MED ORDER — AMLODIPINE BESYLATE 10 MG PO TABS: 10.0000 mg | ORAL_TABLET | Freq: Every day | ORAL | 3 refills | Status: DC

## 2018-09-01 MED ORDER — CLONIDINE HCL 0.3 MG PO TABS: 0.3000 mg | ORAL_TABLET | Freq: Two times a day (BID) | ORAL | 3 refills | Status: DC

## 2018-09-01 MED ORDER — TAMSULOSIN HCL 0.4 MG PO CAPS: 0.4000 mg | ORAL_CAPSULE | Freq: Every day | ORAL | 3 refills | Status: DC

## 2018-09-01 MED ORDER — FINASTERIDE 5 MG PO TABS: 5.0000 mg | ORAL_TABLET | Freq: Every day | ORAL | 3 refills | Status: DC

## 2018-09-01 NOTE — Progress Notes (Signed)
BP 117/74   Pulse 81   Temp 98.8 F (37.1 C) (Oral)   Ht '5\' 11"'$  (1.803 m)   Wt 208 lb 12.8 oz (94.7 kg)   BMI 29.12 kg/m    Subjective:    Patient ID: [name], male    DOB: 01-17-1956, 62 y.o.   MRN: [id number]  HPI: [name] is a 62 y.o. male presenting on 09/01/2018 for Hypertension (3 month ); Hyperlipidemia; and Diabetes  This patient comes in for periodic recheck on his chronic medical conditions.  They do include hypertension, type 2 diabetes, BPH, obstructive sleep apnea.  He had surgery about 2 months ago on his back and seems to be doing fairly well.  He is starting physical therapy at this time.  He has lost about 20 pounds.  He does need a prescription for CPAP supplies for his obstructive sleep apnea.  In addition multiple medications need to be refilled and labs will be performed.  He does not have any other complaints at this time.  Past Medical History:  Diagnosis Date  . Chronic kidney disease   . Complication of anesthesia    Hard to wake  . Degenerative joint disease (DJD) of lumbar spine   . Diabetes mellitus without complication ([place])    Type II  . Hypertension   . Lumbar stenosis   . Proteinuria   . Radicular leg pain    Relevant past medical, surgical, family and social history reviewed and updated as indicated. Interim medical history since our last visit reviewed. Allergies and medications reviewed and updated. DATA REVIEWED: CHART IN EPIC  Family History reviewed for pertinent findings.  Review of Systems  Constitutional: Negative.  Negative for appetite change and fatigue.  HENT: Negative.   Eyes: Negative.  Negative for pain and visual disturbance.  Respiratory: Negative.  Negative for cough, chest tightness, shortness of breath and wheezing.   Cardiovascular: Negative.  Negative for chest pain, palpitations and leg swelling.  Gastrointestinal: Negative.  Negative for abdominal pain, diarrhea, nausea and vomiting.    Endocrine: Negative.   Genitourinary: Negative.   Musculoskeletal: Positive for arthralgias, back pain and myalgias.  Skin: Negative.  Negative for color change and rash.  Neurological: Negative.  Negative for weakness, numbness and headaches.  Psychiatric/Behavioral: Negative.     Allergies as of 09/01/2018   No Known Allergies     Medication List        Accurate as of 09/01/18  2:09 PM. Always use your most recent med list.          amLODipine 10 MG tablet Commonly known as:  NORVASC Take 1 tablet (10 mg total) by mouth daily.   canagliflozin 100 MG Tabs tablet Commonly known as:  INVOKANA TAKE (1) TABLET DAILY BE- FORE BREAKFAST.   cloNIDine 0.3 MG tablet Commonly known as:  CATAPRES Take 1 tablet (0.3 mg total) by mouth 2 (two) times daily.   finasteride 5 MG tablet Commonly known as:  PROSCAR Take 1 tablet (5 mg total) by mouth daily.   irbesartan 300 MG tablet Commonly known as:  AVAPRO irbesartan 300 mg tablet   metFORMIN 500 MG 24 hr tablet Commonly known as:  GLUCOPHAGE-XR Take 1 tablet (500 mg total) by mouth daily.   methocarbamol 500 MG tablet Commonly known as:  ROBAXIN Take 1 tablet (500 mg total) by mouth 3 (three) times daily.   ondansetron 4 MG disintegrating tablet Commonly known as:  ZOFRAN-ODT Take 1 tablet (  4 mg total) by mouth every 8 (eight) hours as needed for nausea or vomiting.   spironolactone 25 MG tablet Commonly known as:  ALDACTONE Take 1 tablet (25 mg total) by mouth daily.   tamsulosin 0.4 MG Caps capsule Commonly known as:  FLOMAX Take 1 capsule (0.4 mg total) by mouth daily.            Durable Medical Equipment  (From admission, onward)         Start     Ordered   09/01/18 0000  For home use only DME continuous positive airway pressure (CPAP)    Comments:  ONLY NEEDS SUPPLIES  Question Answer Comment  Patient has OSA or probable OSA Yes   Is the patient currently using CPAP in the home Yes   Settings  Autotitration   CPAP supplies needed Mask, headgear, cushions, filters, heated tubing and water chamber      09/01/18 1138             Objective:    BP 117/74   Pulse 81   Temp 98.8 F (37.1 C) (Oral)   Ht '5\' 11"'$  (1.803 m)   Wt 208 lb 12.8 oz (94.7 kg)   BMI 29.12 kg/m   No Known Allergies  Wt Readings from Last 3 Encounters:  09/01/18 208 lb 12.8 oz (94.7 kg)  07/06/18 216 lb 14.9 oz (98.4 kg)  06/28/18 216 lb 14.9 oz (98.4 kg)    Physical Exam  Constitutional: He appears well-developed and well-nourished. No distress.  HENT:  Head: Normocephalic and atraumatic.  Eyes: Pupils are equal, round, and reactive to light. Conjunctivae and EOM are normal.  Cardiovascular: Normal rate, regular rhythm and normal heart sounds.  Pulmonary/Chest: Effort normal and breath sounds normal. No respiratory distress.  Skin: Skin is warm and dry.  Psychiatric: He has a normal mood and affect. His behavior is normal.  Nursing note and vitals reviewed.       Assessment & Plan:   1. Essential hypertension - amLODipine (NORVASC) 10 MG tablet; Take 1 tablet (10 mg total) by mouth daily.  Dispense: 90 tablet; Refill: 3 - cloNIDine (CATAPRES) 0.3 MG tablet; Take 1 tablet (0.3 mg total) by mouth 2 (two) times daily.  Dispense: 180 tablet; Refill: 3 - CMP14+EGFR - Lipid panel  2. Type 2 diabetes mellitus with stage 2 chronic kidney disease, without long-term current use of insulin (H[facility]) - metFORMIN (GLUCOPHAGE-XR) 500 MG 24 hr tablet; Take 1 tablet (500 mg total) by mouth daily.  Dispense: 90 tablet; Refill: 3 - canagliflozin (INVOKANA) 100 MG TABS tablet; TAKE (1) TABLET DAILY BE- FORE BREAKFAST.  Dispense: 90 tablet; Refill: 3 - CMP14+EGFR - Lipid panel - Bayer DCA Hb A1c Waived  3. Benign prostatic hyperplasia with incomplete bladder emptying - finasteride (PROSCAR) 5 MG tablet; Take 1 tablet (5 mg total) by mouth daily.  Dispense: 90 tablet; Refill: 3 - tamsulosin (FLOMAX) 0.4 MG  CAPS capsule; Take 1 capsule (0.4 mg total) by mouth daily.  Dispense: 90 capsule; Refill: 3  4. OSA on CPAP - For home use only DME continuous positive airway pressure (CPAP)  5. Need for immunization against influenza - Flu Vaccine QUAD 36+ mos IM   Continue all other maintenance medications as listed above.  Follow up plan: Return in about 6 months (around 03/03/2019) for recheck.  Educational handout given for [place] PA-C [facility] [address]  [place], [place] [address] [phone]  09/01/2018, 2:09 PM

## 2018-09-02 ENCOUNTER — Telehealth: Payer: Self-pay | Admitting: Family Medicine

## 2018-09-02 DIAGNOSIS — E1122 Type 2 diabetes mellitus with diabetic chronic kidney disease: Secondary | ICD-10-CM

## 2018-09-02 DIAGNOSIS — N182 Chronic kidney disease, stage 2 (mild): Principal | ICD-10-CM

## 2018-09-02 MED ORDER — METFORMIN HCL ER 500 MG PO TB24: 1000.0000 mg | ORAL_TABLET | Freq: Every day | ORAL | 0 refills | Status: DC

## 2018-09-02 NOTE — Telephone Encounter (Signed)
I spoke to Mr. Diffee by phone on Saturday, October 19 to inform him of his elevated glucose.  He says that he checked it last night at home and it was approximately 405.  Because of this he took an extra Invokana 100 mg last night.  This morning his sugar was down to 270.  He denies any symptoms related to elevated glucose at this time.  I suggested that his metformin could be increased safely to 500 mg 2 p.o. daily.  I sent that prescription to [facility] care.  I will leave the choice of whether to increase the Invokana or not to his PCP.  The patient voiced agreement.  Claretta Fraise MD

## 2018-09-04 ENCOUNTER — Other Ambulatory Visit: Payer: Self-pay | Admitting: Physician Assistant

## 2018-09-04 ENCOUNTER — Ambulatory Visit: Payer: PPO | Admitting: *Deleted

## 2018-09-04 DIAGNOSIS — M545 Low back pain: Principal | ICD-10-CM

## 2018-09-04 DIAGNOSIS — E1122 Type 2 diabetes mellitus with diabetic chronic kidney disease: Secondary | ICD-10-CM

## 2018-09-04 DIAGNOSIS — N182 Chronic kidney disease, stage 2 (mild): Principal | ICD-10-CM

## 2018-09-04 DIAGNOSIS — M6281 Muscle weakness (generalized): Secondary | ICD-10-CM

## 2018-09-04 DIAGNOSIS — G8929 Other chronic pain: Secondary | ICD-10-CM

## 2018-09-04 LAB — CMP14+EGFR
A/G RATIO: 1.8 (ref 1.2–2.2)
ALT: 26 IU/L (ref 0–44)
AST: 22 IU/L (ref 0–40)
Albumin: 4.5 g/dL (ref 3.6–4.8)
Alkaline Phosphatase: 127 IU/L — ABNORMAL HIGH (ref 39–117)
BILIRUBIN TOTAL: 0.9 mg/dL (ref 0.0–1.2)
BUN/Creatinine Ratio: 18 (ref 10–24)
BUN: 20 mg/dL (ref 8–27)
CHLORIDE: 95 mmol/L — AB (ref 96–106)
CO2: 23 mmol/L (ref 20–29)
Calcium: 9.9 mg/dL (ref 8.6–10.2)
Creatinine, Ser: 1.14 mg/dL (ref 0.76–1.27)
GFR calc Af Amer: 78 mL/min/{1.73_m2} (ref >59)
GFR, EST NON AFRICAN AMERICAN: 68 mL/min/{1.73_m2} (ref >59)
GLOBULIN, TOTAL: 2.5 g/dL (ref 1.5–4.5)
Glucose: 410 mg/dL (ref 65–99)
POTASSIUM: 5 mmol/L (ref 3.5–5.2)
SODIUM: 135 mmol/L (ref 134–144)
Total Protein: 7 g/dL (ref 6.0–8.5)

## 2018-09-04 LAB — LIPID PANEL
CHOL/HDL RATIO: 4.2 ratio (ref 0.0–5.0)
Cholesterol, Total: 169 mg/dL (ref 100–199)
HDL: 40 mg/dL (ref >39)
LDL Calculated: 80 mg/dL (ref 0–99)
TRIGLYCERIDES: 247 mg/dL — AB (ref 0–149)
VLDL Cholesterol Cal: 49 mg/dL — ABNORMAL HIGH (ref 5–40)

## 2018-09-04 MED ORDER — CANAGLIFLOZIN 300 MG PO TABS: ORAL_TABLET | ORAL | 3 refills | Status: DC

## 2018-09-04 NOTE — Therapy (Signed)
[facility] [place], [place], [address] Phone: [phone]   Fax:  [phone]  Physical Therapy Treatment  Patient Details  Name: [name] MRN: [id number] Date of Birth: 06/29/1956 Referring Provider (PT): Ronette Deter, [place]   Encounter Date: 09/04/2018  PT End of Session - 09/04/18 1208    Visit Number  2    Number of Visits  12    Date for PT Re-Evaluation  10/16/18    Authorization Type  FOTO every 5th visit; progress note every 10th visit;    PT Start Time  1115    PT Stop Time  1201    PT Time Calculation (min)  46 min       Past Medical History:  Diagnosis Date  . Chronic kidney disease   . Complication of anesthesia    Hard to wake  . Degenerative joint disease (DJD) of lumbar spine   . Diabetes mellitus without complication ([place])    Type II  . Hypertension   . Lumbar stenosis   . Proteinuria   . Radicular leg pain     Past Surgical History:  Procedure Laterality Date  . BELPHAROPTOSIS REPAIR    . CARDIAC CATHETERIZATION    . EYE SURGERY    . HEMORRHOID SURGERY    . TRANSFORAMINAL LUMBAR INTERBODY FUSION (TLIF) WITH PEDICLE SCREW FIXATION 2 LEVEL N/A 07/06/2018   Procedure: TRANSFORAMINAL LUMBAR INTERBODY FUSION (TLIF) L4-S1;  Surgeon: Melina Schools, MD;  Location: [place];  Service: Orthopedics;  Laterality: N/A;    There were no vitals filed for this visit.  Subjective Assessment - 09/04/18 1125    Subjective  Did ok with Eval. 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    Pain Score  2     Pain Location  Back    Pain Orientation  Lower    Pain Descriptors / Indicators  Discomfort    Pain Type  Surgical pain    Pain Onset  More than a month ago                        [facility]: [facility] Adult PT Treatment/Exercise - 09/04/18 0001      Exercises   Exercises  Lumbar;Knee/Hip      Lumbar Exercises: Aerobic   Nustep  Nustep L3 x 15 mins      Lumbar Exercises: Standing   Heel Raises  20 reps;2 seconds      Lumbar Exercises: Supine   Ab Set  20 reps    Bent Knee Raise  20 reps;3 seconds      Knee/Hip Exercises: Standing   Heel Raises  Both;20 reps;2 sets    Hip Flexion  Both;2 sets;15 reps    Hip Abduction  Both;2 sets;10 reps      Knee/Hip Exercises: Seated   Sit to Sand  1 set;10 reps             PT Education - 09/04/18 1206    Education Details  Drawin in standing with marching, Hip abduction, heel ups, and sit to stand    Person(s) Educated  Patient    Methods  Explanation;Demonstration;Handout    Comprehension  Verbalized understanding;Returned demonstration  PT Long Term Goals - 08/28/18 2004      PT LONG TERM GOAL #1   Title  Patient will be independent with HEP and its progression    Time  6    Period  Weeks    Status  New      PT LONG TERM GOAL #2   Title  Patient will demonstrate 4+/5 or greater LE strength to improve ability to perform functional activities.    Time  6    Period  Weeks    Status  New      PT LONG TERM GOAL #3   Title  Patient will report ability to stand for 30 minutes or greater with pain less than or equal to 3/10 to perform work activities.    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     Time  6    Period  Weeks    Status  New            Plan - 09/04/18 1545    Clinical Impression Statement  Pt arrived today doing fairly well with low pain levels. Rx focused on core activation in supine, but mainly in standing due to Pt reporting that he would be able to perform exs better in standing. He did well with all Exs and  bodymechanics were also reviewed for ADL's. HEP reviewed and sheet given    Clinical Presentation  Stable    Clinical Decision Making  Low    Rehab Potential  Good    PT Frequency  2x / week    PT Duration  6 weeks    PT Treatment/Interventions  ADLs/Self Care Home Management;Cryotherapy;Electrical Stimulation;Moist Heat;Therapeutic activities;Therapeutic exercise;Patient/family education;Manual techniques;Neuromuscular re-education;Passive range of motion;Taping;Balance training;Functional mobility training;Stair training;Gait training    PT Next Visit Plan  Nustep, LE strengthening core stabilization, modalities PRN for pain relief.     PT Home Exercise Plan  see patient education section     Consulted and Agree with Plan of Care  Patient       Patient will benefit from skilled therapeutic intervention in order to improve the following deficits and impairments:  Postural dysfunction, Pain, Decreased strength, Decreased activity tolerance, Decreased endurance, Decreased range of motion  Visit Diagnosis: Chronic low back pain, unspecified back pain laterality, unspecified whether sciatica present  Muscle weakness (generalized)     Problem List Patient Active Problem List   Diagnosis Date Noted  . OSA on CPAP 09/01/2018  . S/P lumbar fusion 07/06/2018  . DDD (degenerative disc disease), lumbar 02/28/2018  . 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 Essential hypertension 02/23/2017  . Benign prostatic hyperplasia with incomplete bladder emptying 02/23/2017  . Vitamin D deficiency 02/23/2017  . Hyperlipidemia associated with type 2 diabetes mellitus ([place]) 02/23/2017  . Type 2 diabetes mellitus with stage 2 chronic kidney disease, without long-term current use of insulin ([place]) 02/23/2017    Janese Radabaugh,CHRIS, PTA 09/04/2018, 3:56 PM  [facility] [address] [place], [place],  [address] Phone: [phone]   Fax:  [phone]  Name: [name] MRN: [id number] Date of Birth: May 22, 1956

## 2018-09-08 ENCOUNTER — Ambulatory Visit: Payer: PPO | Admitting: Physical Therapy

## 2018-09-08 ENCOUNTER — Encounter: Payer: Self-pay | Admitting: Physical Therapy

## 2018-09-08 DIAGNOSIS — G8929 Other chronic pain: Secondary | ICD-10-CM

## 2018-09-08 DIAGNOSIS — M545 Low back pain, unspecified: Secondary | ICD-10-CM

## 2018-09-08 DIAGNOSIS — M6281 Muscle weakness (generalized): Secondary | ICD-10-CM

## 2018-09-08 NOTE — Therapy (Signed)
[facility] [place], [place], [address] Phone: [phone]   Fax:  [phone]  Physical Therapy Treatment  Patient Details  Name: [name] MRN: [id number] Date of Birth: 04-11-1956 Referring Provider (PT): Ronette Deter, [place]   Encounter Date: 09/08/2018  PT End of Session - 09/08/18 1209    Visit Number  3    Number of Visits  12    Date for PT Re-Evaluation  10/16/18    Authorization Type  FOTO every 5th visit; progress note every 10th visit;    PT Start Time  1115    PT Stop Time  1201    PT Time Calculation (min)  46 min    Activity Tolerance  Patient tolerated treatment well    Behavior During Therapy  [facility] for tasks assessed/performed       Past Medical History:  Diagnosis Date  . Chronic kidney disease   . Complication of anesthesia    Hard to wake  . Degenerative joint disease (DJD) of lumbar spine   . Diabetes mellitus without complication ([place])    Type II  . Hypertension   . Lumbar stenosis   . Proteinuria   . Radicular leg pain     Past Surgical History:  Procedure Laterality Date  . BELPHAROPTOSIS REPAIR    . CARDIAC CATHETERIZATION    . EYE SURGERY    . HEMORRHOID SURGERY    . TRANSFORAMINAL LUMBAR INTERBODY FUSION (TLIF) WITH PEDICLE SCREW FIXATION 2 LEVEL N/A 07/06/2018   Procedure: TRANSFORAMINAL LUMBAR INTERBODY FUSION (TLIF) L4-S1;  Surgeon: Melina Schools, MD;  Location: [facility];  Service: Orthopedics;  Laterality: N/A;    There were no vitals filed for this visit.  Subjective Assessment - 09/08/18 1137    Subjective  Patient reports pain is about a 2/10. 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  Yes    Pain Score  2     Pain Location  Back    Pain Orientation  Lower    Pain Descriptors / Indicators  Discomfort    Pain Type  Surgical pain    Pain Onset  More than a month ago    Pain Frequency  Intermittent         [facility] PT Assessment - 09/08/18 0001      Assessment   Medical Diagnosis  Transforaminal lumbar interbody fusion, encounter for other specified surgical after care                   [facility] Adult PT Treatment/Exercise - 09/08/18 0001      Exercises   Exercises  Lumbar;Knee/Hip      Lumbar Exercises: Aerobic   Nustep  Nustep L3 x 15 mins      Lumbar Exercises: Standing   Heel Raises  20 reps;2 seconds    Row  Strengthening;Both;20 reps    Row Limitations  Pink XTS    Shoulder Extension  Strengthening;Both;20 reps    Shoulder Extension Limitations  Pink XTS      Lumbar Exercises: Seated   Long Arc Quad on Chair  Strengthening;Both;2 sets;10 reps    Sit to Stand Limitations  Clam shells x20 red theraband    Other Seated Lumbar Exercises  hip flexion with draw in 3" hold 2x10      Knee/Hip Exercises: Standing   Heel Raises  Both;20 reps;2 sets    Hip Abduction  Both;2 sets;10 reps    Forward Step Up  Both;2 sets;10 reps;Hand Hold: 2;Step Height: 6"    Rocker Board  3 minutes                  PT Long Term Goals - 08/28/18 2004      PT LONG TERM GOAL #1   Title  Patient will be independent with HEP and its progression    Time  6    Period  Weeks    Status  New      PT LONG TERM GOAL #2   Title  Patient will demonstrate 4+/5 or greater LE strength to improve ability to perform functional activities.    Time  6    Period  Weeks    Status  New      PT LONG TERM GOAL #3   Title  Patient will report ability to stand for 30 minutes or greater with pain less than or equal to 3/10 to perform work activities.    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     Time  6    Period  Weeks    Status  New            Plan - 09/08/18 1209    Clinical Impression Statement  Patient was able to tolerate treatment well despite reports of pain at start of session. Patient was able to perform exercises with good form. 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 Patient reported understanding.     Clinical Presentation  Stable    Clinical Decision Making  Low    Rehab Potential  Good    PT Frequency  2x / week    PT Duration  6 weeks    PT Treatment/Interventions  ADLs/Self Care Home Management;Cryotherapy;Electrical Stimulation;Moist Heat;Therapeutic activities;Therapeutic exercise;Patient/family education;Manual techniques;Neuromuscular re-education;Passive range of motion;Taping;Balance training;Functional mobility training;Stair training;Gait training    PT Next Visit Plan  Nustep, LE strengthening core stabilization, modalities PRN for pain relief.     Consulted and Agree with Plan of Care  Patient       Patient will benefit from skilled therapeutic intervention in order to improve the following deficits and impairments:  Postural dysfunction, Pain, Decreased strength, Decreased activity tolerance, Decreased endurance, Decreased range of motion  Visit Diagnosis: Chronic low back pain, unspecified back pain laterality, unspecified whether sciatica present  Muscle weakness (generalized)     Problem List Patient Active Problem List   Diagnosis Date Noted  . OSA on CPAP 09/01/2018  . S/P lumbar fusion 07/06/2018  . DDD (degenerative disc disease), lumbar 02/28/2018  . 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 Essential hypertension 02/23/2017  . Benign prostatic hyperplasia with incomplete bladder emptying 02/23/2017  . Vitamin D deficiency 02/23/2017  . Hyperlipidemia associated with type 2 diabetes mellitus ([place]) 02/23/2017  .  Type 2 diabetes mellitus with stage 2 chronic kidney disease, without long-term current use of insulin ([place]) 02/23/2017   Gabriela Eves, PT, DPT 09/08/2018, 12:18  PM  [facility] [place], [place], [address] Phone: [phone]   Fax:  [phone]  Name: [name] MRN: [id number] Date of Birth: 01-22-1956

## 2018-09-12 ENCOUNTER — Encounter: Payer: Self-pay | Admitting: Physical Therapy

## 2018-09-12 ENCOUNTER — Ambulatory Visit: Payer: PPO | Admitting: Physical Therapy

## 2018-09-12 DIAGNOSIS — M545 Low back pain, unspecified: Secondary | ICD-10-CM

## 2018-09-12 DIAGNOSIS — M6281 Muscle weakness (generalized): Secondary | ICD-10-CM

## 2018-09-12 DIAGNOSIS — G8929 Other chronic pain: Secondary | ICD-10-CM

## 2018-09-12 NOTE — Therapy (Signed)
[facility] [place], [place], [address] Phone: [phone]   Fax:  [phone]  Physical Therapy Treatment  Patient Details  Name: [name] MRN: [id number] Date of Birth: 01-16-1956 Referring Provider (PT): Ronette Deter, [place]   Encounter Date: 09/12/2018  PT End of Session - 09/12/18 1125    Visit Number  4    Number of Visits  12    Date for PT Re-Evaluation  10/16/18    Authorization Type  FOTO every 5th visit; progress note every 10th visit;    PT Start Time  1115    PT Stop Time  1201    PT Time Calculation (min)  46 min    Activity Tolerance  Patient tolerated treatment well    Behavior During Therapy  [facility] for tasks assessed/performed       Past Medical History:  Diagnosis Date  . Chronic kidney disease   . Complication of anesthesia    Hard to wake  . Degenerative joint disease (DJD) of lumbar spine   . Diabetes mellitus without complication ([place])    Type II  . Hypertension   . Lumbar stenosis   . Proteinuria   . Radicular leg pain     Past Surgical History:  Procedure Laterality Date  . BELPHAROPTOSIS REPAIR    . CARDIAC CATHETERIZATION    . EYE SURGERY    . HEMORRHOID SURGERY    . TRANSFORAMINAL LUMBAR INTERBODY FUSION (TLIF) WITH PEDICLE SCREW FIXATION 2 LEVEL N/A 07/06/2018   Procedure: TRANSFORAMINAL LUMBAR INTERBODY FUSION (TLIF) L4-S1;  Surgeon: Melina Schools, MD;  Location: [facility];  Service: Orthopedics;  Laterality: N/A;    There were no vitals filed for this visit.  Subjective Assessment - 09/12/18 1126    Subjective  Patient reported feeling more stiff this morning at about 5/10 but reported pain is about 2/10 after moving around. 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  Yes    Pain Score  2     Pain Location  Back    Pain Orientation  Lower    Pain Descriptors / Indicators  Discomfort    Pain Type  Surgical pain    Pain Onset  More than a month ago    Pain Frequency  Intermittent                       [facility] Adult PT Treatment/Exercise - 09/12/18 0001      Exercises   Exercises  Lumbar;Knee/Hip      Lumbar Exercises: Aerobic   Nustep  Nustep L3 x 15 mins      Lumbar Exercises: Standing   Heel Raises  20 reps;2 seconds    Functional Squats  20 reps   mini squats   Row  Strengthening;Both;20 reps;10 reps   2x15   Row Limitations  Pink XTS    Shoulder Extension  Strengthening;Both;20 reps   2x10   Shoulder Extension Limitations  Pink XTS    Other Standing Lumbar Exercises  lateral stepping x1 in tiled hallway red theraband      Lumbar Exercises: Seated   Long Arc Quad on Chair  Strengthening;Both;2 sets;10 reps    LAQ on Chair  Weights (lbs)  2    Other Seated Lumbar Exercises  hip flexion with draw in 2#, 3" hold 2x10       Knee/Hip Exercises: Standing   Hip Flexion  Both;2 sets;15 reps    Hip Extension  Both;2 sets;10 reps;Knee straight    Rocker Board  3 minutes                  PT Long Term Goals - 08/28/18 2004      PT LONG TERM GOAL #1   Title  Patient will be independent with HEP and its progression    Time  6    Period  Weeks    Status  New      PT LONG TERM GOAL #2   Title  Patient will demonstrate 4+/5 or greater LE strength to improve ability to perform functional activities.    Time  6    Period  Weeks    Status  New      PT LONG TERM GOAL #3   Title  Patient will report ability to stand for 30 minutes or greater with pain less than or equal to 3/10 to perform work activities.    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     Time   6    Period  Weeks    Status  New            Plan - 09/12/18 1254    Clinical Impression Statement  Patient was able to toleate treatment well with exception of lateral band stepping. Exercise was terminated to prevent further pain. Patient otherwise was able to tolerate treatment well. Patient and PT discussed delegating work tasks again as well as starting to move first thing in the morning to alleviate morning stiffness. Patient reported understanding.    Clinical Presentation  Stable    Clinical Decision Making  Low    Rehab Potential  Good    PT Frequency  2x / week    PT Duration  6 weeks    PT Treatment/Interventions  ADLs/Self Care Home Management;Cryotherapy;Electrical Stimulation;Moist Heat;Therapeutic activities;Therapeutic exercise;Patient/family education;Manual techniques;Neuromuscular re-education;Passive range of motion;Taping;Balance training;Functional mobility training;Stair training;Gait training    PT Next Visit Plan  Nustep, LE strengthening core stabilization, modalities PRN for pain relief.     Consulted and Agree with Plan of Care  Patient       Patient will benefit from skilled therapeutic intervention in order to improve the following deficits and impairments:  Postural dysfunction, Pain, Decreased strength, Decreased activity tolerance, Decreased endurance, Decreased range of motion  Visit Diagnosis: Muscle weakness (generalized)  Chronic low back pain, unspecified back pain laterality, unspecified whether sciatica present     Problem List Patient Active Problem List   Diagnosis Date Noted  . OSA on CPAP 09/01/2018  . S/P lumbar fusion 07/06/2018  . DDD (degenerative disc disease), lumbar 02/28/2018  . 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 Essential hypertension 02/23/2017  . Benign prostatic hyperplasia with incomplete bladder emptying 02/23/2017  . Vitamin D deficiency 02/23/2017  .  Hyperlipidemia associated with type 2 diabetes mellitus ([place]) 02/23/2017  . Type 2 diabetes mellitus with stage 2 chronic kidney disease, without long-term current use  of insulin ([place]) 02/23/2017   Gabriela Eves, PT, DPT 09/12/2018, 1:49 PM  [facility] [address] [place], [place], [address] Phone: [phone]   Fax:  [phone]  Name: [name] MRN: [id number] Date of Birth: [birth year]

## 2018-09-14 ENCOUNTER — Encounter: Payer: Self-pay | Admitting: Physical Therapy

## 2018-09-14 ENCOUNTER — Ambulatory Visit: Payer: PPO | Admitting: Physical Therapy

## 2018-09-14 DIAGNOSIS — M545 Low back pain, unspecified: Secondary | ICD-10-CM

## 2018-09-14 DIAGNOSIS — G8929 Other chronic pain: Secondary | ICD-10-CM

## 2018-09-14 DIAGNOSIS — M6281 Muscle weakness (generalized): Secondary | ICD-10-CM

## 2018-09-14 NOTE — Therapy (Addendum)
[facility] [place], [place], [address] Phone: [phone]   Fax:  [phone]  Physical Therapy Treatment  Patient Details  Name: [name] MRN: [id number] Date of Birth: 12-Jun-1956 Referring Provider (PT): Ronette Deter, [place]   Encounter Date: 09/14/2018  PT End of Session - 09/14/18 1131    Visit Number  5    Number of Visits  12    Date for PT Re-Evaluation  10/16/18    Authorization Type  FOTO every 5th visit; progress note every 10th visit;    PT Start Time  1115    PT Stop Time  1157    PT Time Calculation (min)  42 min    Activity Tolerance  Patient tolerated treatment well    Behavior During Therapy  [facility] for tasks assessed/performed       Past Medical History:  Diagnosis Date  . Chronic kidney disease   . Complication of anesthesia    Hard to wake  . Degenerative joint disease (DJD) of lumbar spine   . Diabetes mellitus without complication ([place])    Type II  . Hypertension   . Lumbar stenosis   . Proteinuria   . Radicular leg pain     Past Surgical History:  Procedure Laterality Date  . BELPHAROPTOSIS REPAIR    . CARDIAC CATHETERIZATION    . EYE SURGERY    . HEMORRHOID SURGERY    . TRANSFORAMINAL LUMBAR INTERBODY FUSION (TLIF) WITH PEDICLE SCREW FIXATION 2 LEVEL N/A 07/06/2018   Procedure: TRANSFORAMINAL LUMBAR INTERBODY FUSION (TLIF) L4-S1;  Surgeon: Melina Schools, MD;  Location: [facility];  Service: Orthopedics;  Laterality: N/A;    There were no vitals filed for this visit.  Subjective Assessment - 09/14/18 1131    Subjective  Patient reports feeling more pain today and attributes it to the weather.     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  Yes    Pain Score  3     Pain Location  Back    Pain Orientation  Lower     Pain Descriptors / Indicators  Discomfort    Pain Type  Surgical pain    Pain Onset  More than a month ago         [facility] PT Assessment - 09/14/18 0001      Observation/Other Assessments   Focus on Therapeutic Outcomes (FOTO)   50% limited      Strength   Right/Left Hip  Right;Left    Right Hip Flexion  4/5    Left Hip Flexion  4/5    Right/Left Knee  Right;Left    Right Knee Flexion  4+/5    Right Knee Extension  5/5    Left Knee Flexion  4+/5    Left Knee Extension  5/5                   [facility] Adult PT Treatment/Exercise - 09/14/18 0001      Exercises   Exercises  Lumbar;Knee/Hip      Lumbar Exercises: Aerobic   Nustep  Nustep L5 x 15 mins      Lumbar Exercises: Standing   Row  Strengthening;Both;20 reps;10 reps    Row Limitations  pink XTS    Shoulder Extension  Strengthening;Both;20 reps  Shoulder Extension Limitations  Pink XTS    Other Standing Lumbar Exercises  wood chops x10 pink XTS      Lumbar Exercises: Seated   Long Arc Quad on Chair  Strengthening;Both;2 sets;10 reps    LAQ on Chair Weights (lbs)  2    Other Seated Lumbar Exercises  hip flexion with draw in 2#, 3" hold 2x10  followed by hamstring curls x20  red theraband    Other Seated Lumbar Exercises  Clam shells x20 red threaband                   PT Long Term Goals - 09/14/18 1143      PT LONG TERM GOAL #1   Title  Patient will be independent with HEP and its progression    Time  6    Period  Weeks    Status  Achieved      PT LONG TERM GOAL #2   Title  Patient will demonstrate 4+/5 or greater LE strength to improve ability to perform functional activities.    Time  6    Period  Weeks    Status  On-going      PT LONG TERM GOAL #3   Title  Patient will report ability to stand for 30 minutes or greater with pain less than or equal to 3/10 to perform work activities.    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    Time  6    Period  Weeks    Status  Achieved            Plan - 09/14/18 1204    Clinical Impression Statement  Patient was able to tolerate treatment well; patient was able to perform progression of treatment with no complaints but required intermittent cuing for proper form with wood chops to prevent trunk rotation. Patient's goals are ongoing at this time. Patient and PT reviewed HEP and reiterated importance of exercise outside of PT to improve core stability. Patient reported understanding. FOTO score 50% limited.    Clinical Presentation  Stable    Clinical Decision Making  Low    PT Frequency  2x / week    PT Duration  6 weeks    PT Treatment/Interventions  ADLs/Self Care Home Management;Cryotherapy;Electrical Stimulation;Moist Heat;Therapeutic activities;Therapeutic exercise;Patient/family education;Manual techniques;Neuromuscular re-education;Passive range of motion;Taping;Balance training;Functional mobility training;Stair training;Gait training    PT Next Visit Plan  Nustep, LE strengthening core stabilization, modalities PRN for pain relief.     PT Home Exercise Plan  see patient education section     Consulted and Agree with Plan of Care  Patient       Patient will benefit from skilled therapeutic intervention in order to improve the following deficits and impairments:  Postural dysfunction, Pain, Decreased strength, Decreased activity tolerance, Decreased endurance, Decreased range of motion  Visit Diagnosis: Muscle weakness (generalized)  Chronic low back pain, unspecified back pain laterality, unspecified whether sciatica present     Problem List Patient Active Problem List   Diagnosis Date Noted  . OSA on CPAP 09/01/2018  . S/P lumbar fusion 07/06/2018  . DDD (degenerative disc disease), lumbar 02/28/2018  . Hypertensive kidney disease with CKD (chronic kidney  disease) stage V ([place]) 02/23/2017  .  Essential hypertension 02/23/2017  . Benign prostatic hyperplasia with incomplete bladder emptying 02/23/2017  . Vitamin D deficiency 02/23/2017  . Hyperlipidemia associated with type 2 diabetes mellitus ([place]) 02/23/2017  . Type 2 diabetes mellitus with stage 2 chronic kidney disease, without long-term current use of insulin ([place]) 02/23/2017   Gabriela Eves, PT, DPT 09/14/2018, 12:12 PM  [facility] [place], [place], [address] Phone: [phone]   Fax:  [phone]  Name: [name] MRN: [id number] Date of Birth: Sep 28, 1956

## 2018-09-19 ENCOUNTER — Ambulatory Visit: Payer: PPO | Attending: Physician Assistant | Admitting: Physical Therapy

## 2018-09-19 ENCOUNTER — Encounter: Payer: Self-pay | Admitting: Physical Therapy

## 2018-09-19 DIAGNOSIS — G8929 Other chronic pain: Secondary | ICD-10-CM

## 2018-09-19 DIAGNOSIS — M6281 Muscle weakness (generalized): Secondary | ICD-10-CM | POA: Diagnosis not present

## 2018-09-19 DIAGNOSIS — M545 Low back pain, unspecified: Secondary | ICD-10-CM

## 2018-09-19 NOTE — Therapy (Signed)
[facility] [place], [place], [address] Phone: [phone]   Fax:  [phone]  Physical Therapy Treatment  Patient Details  Name: [name] MRN: [id number] Date of Birth: [birth year] Referring Provider (PT): Ronette Deter, [place]   Encounter Date: 09/19/2018  PT End of Session - 09/19/18 1129    Visit Number  6    Number of Visits  12    Date for PT Re-Evaluation  10/16/18    Authorization Type  FOTO every 5th visit; progress note every 10th visit;    PT Start Time  1115    PT Stop Time  1201    PT Time Calculation (min)  46 min    Activity Tolerance  Patient tolerated treatment well    Behavior During Therapy  [facility] for tasks assessed/performed       Past Medical History:  Diagnosis Date  . Chronic kidney disease   . Complication of anesthesia    Hard to wake  . Degenerative joint disease (DJD) of lumbar spine   . Diabetes mellitus without complication ([place])    Type II  . Hypertension   . Lumbar stenosis   . Proteinuria   . Radicular leg pain     Past Surgical History:  Procedure Laterality Date  . BELPHAROPTOSIS REPAIR    . CARDIAC CATHETERIZATION    . EYE SURGERY    . HEMORRHOID SURGERY    . TRANSFORAMINAL LUMBAR INTERBODY FUSION (TLIF) WITH PEDICLE SCREW FIXATION 2 LEVEL N/A 07/06/2018   Procedure: TRANSFORAMINAL LUMBAR INTERBODY FUSION (TLIF) L4-S1;  Surgeon: Melina Schools, MD;  Location: [place];  Service: Orthopedics;  Laterality: N/A;    There were no vitals filed for this visit.  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  Yes    Pain Score   4     Pain Location  Back    Pain Orientation  Lower    Pain Descriptors / Indicators  Discomfort    Pain Type  Surgical pain    Pain Onset  More than a month ago    Pain Frequency  Intermittent         [facility] PT Assessment - 09/19/18 0001      Assessment   Medical Diagnosis  Transforaminal lumbar interbody fusion, encounter for other specified surgical after care    Next MD Visit  September 28, 2018    Prior Therapy  yes                   [facility] Adult PT Treatment/Exercise - 09/19/18 0001      Exercises   Exercises  Lumbar;Knee/Hip      Lumbar Exercises: Aerobic   Stationary Bike  Level 3 x15 mins    UBE (Upper Arm Bike)  120 RPM x6 minutes (3 mins fwd, 50mins bwd)      Lumbar Exercises: Standing   Row  --    Row Limitations  --    Shoulder Extension  --    Shoulder Extension Limitations  --    Other Standing Lumbar Exercises  --  Lumbar Exercises: Seated   Long Arc Quad on Chair  Strengthening;Both;2 sets;10 reps    LAQ on Chair Weights (lbs)  2    Other Seated Lumbar Exercises  hip flexion with draw in 3#, 3" hold 2x10       Knee/Hip Exercises: Standing   Hip Abduction  Both;2 sets;10 reps    Hip Extension  Both;2 sets;10 reps;Knee straight      Manual Therapy   Manual Therapy  Soft tissue mobilization    Manual therapy comments  STW/M in sidelying to bilateral lumbar paraspinals and QL to decrease tone and pain.                  PT Long Term Goals - 09/14/18 1143      PT LONG TERM GOAL #1   Title  Patient will be independent with HEP and its progression    Time  6    Period  Weeks    Status  Achieved      PT LONG TERM GOAL #2   Title  Patient will demonstrate 4+/5 or greater LE strength to improve ability to perform functional activities.    Time  6    Period  Weeks    Status  On-going      PT LONG TERM GOAL #3   Title  Patient will report ability to stand for 30 minutes or greater with pain less than or equal to 3/10 to  perform work activities.    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    Time  6    Period  Weeks    Status  Achieved            Plan - 09/19/18 1208    Clinical Impression Statement  Patient was able to tolerate treatment well with no complaints of increased pain. Patient required one instance of verbal cuing for proper form with mini squats. Patient was able to demonstrate with improved form. Patient noted with increased muscle tone in R QL during STW/M that decreased after manual therapy. Patient reported feeling "better" but did not mention a number on the pain scale.     Clinical Presentation  Stable    Clinical Decision Making  Low    Rehab Potential  Good    PT Frequency  2x / week    PT Duration  6 weeks    PT Treatment/Interventions  ADLs/Self Care Home Management;Cryotherapy;Electrical Stimulation;Moist Heat;Therapeutic activities;Therapeutic exercise;Patient/family education;Manual techniques;Neuromuscular re-education;Passive range of motion;Taping;Balance training;Functional mobility training;Stair training;Gait training    PT Next Visit Plan  Nustep, LE strengthening core stabilization, modalities PRN for pain relief.     Consulted and Agree with Plan of Care  Patient       Patient will benefit from skilled therapeutic intervention in order to improve the following deficits and impairments:  Postural dysfunction, Pain, Decreased strength, Decreased activity tolerance, Decreased endurance, Decreased range of motion  Visit Diagnosis: Muscle weakness (generalized)  Chronic low back pain, unspecified back pain laterality, unspecified whether sciatica present     Problem List Patient Active Problem List   Diagnosis Date Noted  . OSA on CPAP 09/01/2018  . S/P lumbar fusion 07/06/2018  . DDD (degenerative disc disease), lumbar  02/28/2018  . 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 Essential hypertension 02/23/2017  . Benign prostatic hyperplasia with incomplete bladder emptying 02/23/2017  . Vitamin D deficiency 02/23/2017  . Hyperlipidemia associated with type 2 diabetes mellitus ([place]) 02/23/2017  . Type 2 diabetes mellitus with stage 2 chronic kidney disease, without long-term current use of insulin ([place]) 02/23/2017   Gabriela Eves, PT, DPT 09/19/2018, 12:16 PM  [facility] [place], [place], [address] Phone: [phone]   Fax:  [phone]  Name: [place] MRN: [id number] Date of Birth: 1956/11/09

## 2018-09-21 ENCOUNTER — Ambulatory Visit: Payer: PPO | Admitting: Physical Therapy

## 2018-09-21 ENCOUNTER — Encounter: Payer: Self-pay | Admitting: Physical Therapy

## 2018-09-21 DIAGNOSIS — M6281 Muscle weakness (generalized): Secondary | ICD-10-CM | POA: Diagnosis not present

## 2018-09-21 DIAGNOSIS — G8929 Other chronic pain: Secondary | ICD-10-CM

## 2018-09-21 DIAGNOSIS — M545 Low back pain: Secondary | ICD-10-CM

## 2018-09-21 NOTE — Therapy (Signed)
[name], [place], [address] Phone: [phone]   Fax:  [phone]  Physical Therapy Treatment  Patient Details  Name: [name] MRN: [id number] Date of Birth: [birth year] Referring Provider (PT): Ronette Deter, [place]   Encounter Date: 09/21/2018  PT End of Session - 09/21/18 1126    Visit Number  7    Number of Visits  12    Date for PT Re-Evaluation  10/16/18    Authorization Type  FOTO every 5th visit; progress note every 10th visit;    PT Start Time  1115    PT Stop Time  1201    PT Time Calculation (min)  46 min    Activity Tolerance  Patient tolerated treatment well    Behavior During Therapy  [facility] for tasks assessed/performed       Past Medical History:  Diagnosis Date  . Chronic kidney disease   . Complication of anesthesia    Hard to wake  . Degenerative joint disease (DJD) of lumbar spine   . Diabetes mellitus without complication ([place])    Type II  . Hypertension   . Lumbar stenosis   . Proteinuria   . Radicular leg pain     Past Surgical History:  Procedure Laterality Date  . BELPHAROPTOSIS REPAIR    . CARDIAC CATHETERIZATION    . EYE SURGERY    . HEMORRHOID SURGERY    . TRANSFORAMINAL LUMBAR INTERBODY FUSION (TLIF) WITH PEDICLE SCREW FIXATION 2 LEVEL N/A 07/06/2018   Procedure: TRANSFORAMINAL LUMBAR INTERBODY FUSION (TLIF) L4-S1;  Surgeon: Melina Schools, MD;  Location: [place];  Service: Orthopedics;  Laterality: N/A;    There were no vitals filed for this visit.  Subjective Assessment - 09/21/18 1117    Subjective  Patient reports he is "still over doing it." Pain is about a 3/10.    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  Yes    Pain Score  3     Pain Orientation  Lower    Pain Descriptors / Indicators   Discomfort    Pain Type  Surgical pain    Pain Onset  More than a month ago         [facility] PT Assessment - 09/21/18 0001      Assessment   Medical Diagnosis  Transforaminal lumbar interbody fusion, encounter for other specified surgical after care    Next MD Visit  September 28, 2018    Prior Therapy  yes                   [facility] Adult PT Treatment/Exercise - 09/21/18 0001      Exercises   Exercises  Lumbar;Knee/Hip      Lumbar Exercises: Aerobic   Stationary Bike  --    UBE (Upper Arm Bike)  120 RPM x6 minutes (3 mins fwd, 24mins bwd)    Nustep  Nustep L5 x 15 mins      Lumbar Exercises: Standing   Row  Strengthening;Both;20 reps;10 reps    Row Limitations  Pink XTS    Shoulder Extension  Strengthening;Both;20 reps;10 reps    Shoulder Extension Limitations  Pink XTS    Other Standing Lumbar Exercises  wood chops x20 pink XTS      Manual Therapy  Manual Therapy  Soft tissue mobilization    Manual therapy comments  STW/M in sidelying to bilateral lumbar paraspinals and QL to decrease tone and pain.                  PT Long Term Goals - 09/14/18 1143      PT LONG TERM GOAL #1   Title  Patient will be independent with HEP and its progression    Time  6    Period  Weeks    Status  Achieved      PT LONG TERM GOAL #2   Title  Patient will demonstrate 4+/5 or greater LE strength to improve ability to perform functional activities.    Time  6    Period  Weeks    Status  On-going      PT LONG TERM GOAL #3   Title  Patient will report ability to stand for 30 minutes or greater with pain less than or equal to 3/10 to perform work activities.    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    Time  6    Period  Weeks    Status  Achieved            Plan - 09/21/18 1210    Clinical Impression Statement   Patient was able to tolerate progression of treatment well with no reports of increased pain. Patient responsed well to STW/M and noted a decrease in pain but did not report a number on the pain scale.     Clinical Presentation  Stable    Clinical Decision Making  Low    Rehab Potential  Good    PT Frequency  2x / week    PT Duration  6 weeks    PT Treatment/Interventions  ADLs/Self Care Home Management;Cryotherapy;Electrical Stimulation;Moist Heat;Therapeutic activities;Therapeutic exercise;Patient/family education;Manual techniques;Neuromuscular re-education;Passive range of motion;Taping;Balance training;Functional mobility training;Stair training;Gait training    PT Next Visit Plan  Nustep, LE strengthening core stabilization, modalities PRN for pain relief.     PT Home Exercise Plan  see patient education section     Consulted and Agree with Plan of Care  Patient       Patient will benefit from skilled therapeutic intervention in order to improve the following deficits and impairments:  Postural dysfunction, Pain, Decreased strength, Decreased activity tolerance, Decreased endurance, Decreased range of motion  Visit Diagnosis: Muscle weakness (generalized)  Chronic low back pain, unspecified back pain laterality, unspecified whether sciatica present     Problem List Patient Active Problem List   Diagnosis Date Noted  . OSA on CPAP 09/01/2018  . S/P lumbar fusion 07/06/2018  . DDD (degenerative disc disease), lumbar 02/28/2018  . 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 Essential hypertension 02/23/2017  . Benign prostatic hyperplasia with incomplete bladder emptying 02/23/2017  . Vitamin D deficiency 02/23/2017  . Hyperlipidemia associated with type 2 diabetes mellitus ([place]) 02/23/2017  . Type 2 diabetes mellitus with stage 2 chronic kidney disease, without long-term current use of insulin ([place]) 02/23/2017    Gabriela Eves, PT, DPT 09/21/2018, 12:11 PM  [facility] [address] [place], [place], [address] Phone: [phone]   Fax:  [phone]  Name: [name] MRN: [id number] Date of Birth: 1956/09/13

## 2018-09-26 DIAGNOSIS — Z4889 Encounter for other specified surgical aftercare: Secondary | ICD-10-CM | POA: Diagnosis not present

## 2018-09-26 DIAGNOSIS — Z4789 Encounter for other orthopedic aftercare: Secondary | ICD-10-CM | POA: Diagnosis not present

## 2018-09-27 ENCOUNTER — Encounter: Payer: Self-pay | Admitting: Physical Therapy

## 2018-09-27 ENCOUNTER — Ambulatory Visit: Payer: PPO | Admitting: Physical Therapy

## 2018-09-27 DIAGNOSIS — M6281 Muscle weakness (generalized): Secondary | ICD-10-CM | POA: Diagnosis not present

## 2018-09-27 DIAGNOSIS — M545 Low back pain, unspecified: Secondary | ICD-10-CM

## 2018-09-27 DIAGNOSIS — G8929 Other chronic pain: Secondary | ICD-10-CM

## 2018-09-27 NOTE — Therapy (Addendum)
[facility] [place], [place], [address] Phone: [phone]   Fax:  [phone]  Physical Therapy Treatment PHYSICAL THERAPY DISCHARGE SUMMARY  Visits from Start of Care: 8  Current functional level related to goals / functional outcomes: See below   Remaining deficits: See goals    Education / Equipment: HEP and ADL education Plan: Patient agrees to discharge.  Patient goals were partially met. Patient is being discharged due to not returning since the last visit.  ?????   Gabriela Eves, PT, DPT 02/14/19  Patient Details  Name: [name] MRN: [id number] Date of Birth: [birth year] Referring Provider (PT): Ronette Deter, [place]   Encounter Date: 09/27/2018  PT End of Session - 09/27/18 1154    Visit Number  8    Number of Visits  12    Date for PT Re-Evaluation  10/16/18    Authorization Type  FOTO every 5th visit; progress note every 10th visit;    PT Start Time  1115    PT Stop Time  1155    PT Time Calculation (min)  40 min    Activity Tolerance  Patient tolerated treatment well    Behavior During Therapy  [facility] for tasks assessed/performed       Past Medical History:  Diagnosis Date  . Chronic kidney disease   . Complication of anesthesia    Hard to wake  . Degenerative joint disease (DJD) of lumbar spine   . Diabetes mellitus without complication ([place])    Type II  . Hypertension   . Lumbar stenosis   . Proteinuria   . Radicular leg pain     Past Surgical History:  Procedure Laterality Date  . BELPHAROPTOSIS REPAIR    . CARDIAC CATHETERIZATION    . EYE SURGERY    . HEMORRHOID SURGERY    . TRANSFORAMINAL LUMBAR INTERBODY FUSION (TLIF) WITH PEDICLE SCREW FIXATION 2 LEVEL N/A 07/06/2018   Procedure: TRANSFORAMINAL LUMBAR INTERBODY FUSION (TLIF) L4-S1;  Surgeon: Melina Schools, MD;  Location: [place];  Service: Orthopedics;  Laterality: N/A;    There were no vitals filed for this  visit.  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  Yes    Pain Score  3     Pain Location  Back    Pain Orientation  Lower    Pain Descriptors / Indicators  Discomfort    Pain Type  Surgical pain    Pain Onset  More than a month ago    Pain Frequency  Intermittent    Aggravating Factors   bending over wrong    Pain Relieving Factors  sitting upright and rest                       [facility] Adult PT Treatment/Exercise - 09/27/18 0001      Exercises   Exercises  Lumbar;Knee/Hip      Lumbar Exercises: Aerobic   Nustep  Nustep L5 x 15 mins UE/LE activity      Lumbar Exercises: Standing  Row  Strengthening;Both;20 reps;10 reps    Row Limitations  [place]ange XTS    Shoulder Extension  Strengthening;Both;20 reps;10 reps    Shoulder Extension Limitations  Orange XTS      Lumbar Exercises: Seated   Long Arc Quad on Chair  Strengthening;Both;10 reps;3 sets    LAQ on [employer] (lbs)  3    Sit to Stand  10 reps    Other Seated Lumbar Exercises  hip flexion with draw in 3#, 3" hold 2x10       Lumbar Exercises: Supine   Ab Set  5 reps;5 seconds    Glut Set  5 reps;5 seconds    Clam  20 reps;3 seconds    Clam Limitations  with draw in /red t-band    Bent Knee Raise  20 reps;3 seconds;5 seconds    Straight Leg Raise  20 reps;3 seconds             PT Education - 09/27/18 1200    Education Details  proper bending technique for tieing shoes and bending to put wood in Animator) Educated  Patient    Methods  Explanation;Demonstration;Verbal cues    Comprehension  Verbalized  understanding;Returned demonstration          PT Long Term Goals - 09/27/18 1127      PT LONG TERM GOAL #1   Title  Patient will be independent with HEP and its progression    Time  6    Period  Weeks    Status  Achieved      PT LONG TERM GOAL #2   Title  Patient will demonstrate 4+/5 or greater LE strength to improve ability to perform functional activities.    Time  6    Period  Weeks    Status  On-going      PT LONG TERM GOAL #3   Title  Patient will report ability to stand for 30 minutes or greater with pain less than or equal to 3/10 to perform work activities.    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    Time  6    Period  Weeks    Status  Achieved            Plan - 09/27/18 1155    Clinical Impression Statement  Patient tolerated treatment well today and able to progress exercises for LE and core today. Today focused on education for bending. Patient reported difficulty and increased discomfort when bending to tie his shoes and bending to put wood in fireplace. Today educated patient on proper technique for both with demo for patient so that he can easily perform in a safe way to reduce pain or injury to back. Patient only able to stand 10 min at a time before he requires a rest or assistance for back. Goals ongoing at this time.     Rehab Potential  Good    PT Frequency  2x / week    PT Duration  6 weeks    PT Treatment/Interventions  ADLs/Self Care Home Management;Cryotherapy;Electrical Stimulation;Moist Heat;Therapeutic activities;Therapeutic exercise;Patient/family education;Manual techniques;Neuromuscular re-education;Passive range of motion;Taping;Balance training;Functional mobility training;Stair training;Gait training    PT  Next Visit Plan  cont with LE  strengthening core stabilization, STW/ modalities PRN for pain relief.     Consulted and Agree with Plan of Care  Patient       Patient will benefit from skilled therapeutic intervention in order to improve the following deficits and impairments:  Postural dysfunction, Pain, Decreased strength, Decreased activity tolerance, Decreased endurance, Decreased range of motion  Visit Diagnosis: Muscle weakness (generalized)  Chronic low back pain, unspecified back pain laterality, unspecified whether sciatica present     Problem List Patient Active Problem List   Diagnosis Date Noted  . OSA on CPAP 09/01/2018  . S/P lumbar fusion 07/06/2018  . DDD (degenerative disc disease), lumbar 02/28/2018  . Hypertensive kidney disease with CKD (chronic kidney disease) stage V ([place]) 02/23/2017  . Essential hypertension 02/23/2017  . Benign prostatic hyperplasia with incomplete bladder emptying 02/23/2017  . Vitamin D deficiency 02/23/2017  . Hyperlipidemia associated with type 2 diabetes mellitus ([place]) 02/23/2017  . Type 2 diabetes mellitus with stage 2 chronic kidney disease, without long-term current use of insulin ([place]) 02/23/2017    DUNFORD, CHRISTINA P, PTA 09/27/2018, 12:01 PM  [facility] [place], [place], [address] Phone: [phone]   Fax:  [phone]  Name: [name] MRN: [id number] Date of Birth: 05/05/1956

## 2018-09-29 ENCOUNTER — Encounter: Payer: PPO | Admitting: *Deleted

## 2018-11-10 ENCOUNTER — Encounter: Payer: Self-pay | Admitting: Family

## 2018-11-10 ENCOUNTER — Ambulatory Visit (INDEPENDENT_AMBULATORY_CARE_PROVIDER_SITE_OTHER): Payer: PPO | Admitting: Family

## 2018-11-10 VITALS — BP 133/73 | HR 65 | Temp 97.2°F | Ht 71.0 in | Wt 202.0 lb

## 2018-11-10 DIAGNOSIS — R739 Hyperglycemia, unspecified: Secondary | ICD-10-CM | POA: Diagnosis not present

## 2018-11-10 DIAGNOSIS — N182 Chronic kidney disease, stage 2 (mild): Secondary | ICD-10-CM | POA: Diagnosis not present

## 2018-11-10 DIAGNOSIS — E1122 Type 2 diabetes mellitus with diabetic chronic kidney disease: Secondary | ICD-10-CM | POA: Diagnosis not present

## 2018-11-10 LAB — URINALYSIS
Bilirubin, UA: NEGATIVE
KETONES UA: NEGATIVE
LEUKOCYTES UA: NEGATIVE
Nitrite, UA: NEGATIVE
RBC, UA: NEGATIVE
Specific Gravity, UA: 1.01 (ref 1.005–1.030)
Urobilinogen, Ur: 0.2 mg/dL (ref 0.2–1.0)
pH, UA: 5 (ref 5.0–7.5)

## 2018-11-10 LAB — GLUCOSE HEMOCUE WAIVED: GLU HEMOCUE WAIVED: 322 mg/dL — AB (ref 65–99)

## 2018-11-10 LAB — BAYER DCA HB A1C WAIVED: HB A1C: 8.7 % — AB (ref <7.0)

## 2018-11-10 MED ORDER — INSULIN DEGLUDEC 100 UNIT/ML ~~LOC~~ SOPN: 15.0000 [IU] | PEN_INJECTOR | Freq: Every day | SUBCUTANEOUS | 2 refills | Status: DC

## 2018-11-10 NOTE — Progress Notes (Signed)
Subjective:    Patient ID: [name], male    DOB: 09/18/1956, 62 y.o.   MRN: [id number]  Chief Complaint  Patient presents with  . Hyperglycemia   PT presents to the office today with hyperglycemia. He states his blood sugars over the last 3 months have been running 300-400's. States yesterday his blood sugar was 500 and he took a family member's insulin and went down to 200's.  Hyperglycemia  This is a recurrent problem. Associated symptoms include a visual change.  Diabetes  He presents for his follow-up diabetic visit. He has type 2 diabetes mellitus. His disease course has been worsening. There are no hypoglycemic associated symptoms. Associated symptoms include blurred vision, visual change and weight loss. Pertinent negatives for diabetes include no foot paresthesias. There are no hypoglycemic complications. Symptoms are worsening. Pertinent negatives for diabetic complications include no CVA or heart disease. He is following a generally unhealthy diet. His overall blood glucose range is >200 mg/dl.      Review of Systems  Constitutional: Positive for weight loss.  Eyes: Positive for blurred vision.  All other systems reviewed and are negative.      Objective:   Physical Exam Vitals signs reviewed.  Constitutional:      General: He is not in acute distress.    Appearance: He is well-developed.  HENT:     Head: Normocephalic.     Right Ear: External ear normal.     Left Ear: External ear normal.  Eyes:     General:        Right eye: No discharge.        Left eye: No discharge.     Pupils: Pupils are equal, round, and reactive to light.  Neck:     Musculoskeletal: Normal range of motion and neck supple.     Thyroid: No thyromegaly.  Cardiovascular:     Rate and Rhythm: Normal rate and regular rhythm.     Heart sounds: Normal heart sounds. No murmur.  Pulmonary:     Effort: Pulmonary effort is normal. No respiratory distress.     Breath sounds: Normal  breath sounds. No wheezing.  Abdominal:     General: Bowel sounds are normal. There is no distension.     Palpations: Abdomen is soft.     Tenderness: There is no abdominal tenderness.  Musculoskeletal: Normal range of motion.        General: No tenderness.  Skin:    General: Skin is warm and dry.     Findings: No erythema or rash.  Neurological:     Mental Status: He is alert and oriented to person, place, and time.     Cranial Nerves: No cranial nerve deficit.     Deep Tendon Reflexes: Reflexes are normal and symmetric.  Psychiatric:        Behavior: Behavior normal.        Thought Content: Thought content normal.        Judgment: Judgment normal.       BP 133/73   Pulse 65   Temp (!) 97.2 F (36.2 C) (Oral)   Ht '5\' 11"'$  (1.803 m)   Wt 202 lb (91.6 kg)   BMI 28.17 kg/m      Assessment & Plan:  [name] comes in today with chief complaint of Hyperglycemia   Diagnosis and orders addressed:  1. Type 2 diabetes mellitus with stage 2 chronic kidney disease, without long-term current use of insulin (HCC) We  will start [place] today at 15 units, he will increase 3 units every 4 days until blood glucose is less than 180 Strict low carb diet Follow up with PCP in 1 week - Bayer DCA Hb A1c Waived - BMP8+EGFR - Glucose Hemocue Waived - insulin degludec (TRESIBA FLEXTOUCH) 100 UNIT/ML SOPN FlexTouch Pen; Inject 0.15 mLs (15 Units total) into the skin daily.  Dispense: 1 pen; Refill: 2 - Urinalysis  2. Hyperglycemia - Bayer DCA Hb A1c Waived - BMP8+EGFR - Glucose Hemocue Waived - insulin degludec (TRESIBA FLEXTOUCH) 100 UNIT/ML SOPN FlexTouch Pen; Inject 0.15 mLs (15 Units total) into the skin daily.  Dispense: 1 pen; Refill: 2 - Urinalysis  Spent 15 mins showing patient how to give himself tresiba injection. Sample from office given.    Evelina Dun, FNP

## 2018-11-10 NOTE — Patient Instructions (Signed)
Diabetes Mellitus and Nutrition, Adult  When you have diabetes (diabetes mellitus), it is very important to have healthy eating habits because your blood sugar (glucose) levels are greatly affected by what you eat and drink. Eating healthy foods in the appropriate amounts, at about the same times every day, can help you:  · Control your blood glucose.  · Lower your risk of heart disease.  · Improve your blood pressure.  · Reach or maintain a healthy weight.  Every person with diabetes is different, and each person has different needs for a meal plan. Your health care provider may recommend that you work with a diet and nutrition specialist (dietitian) to make a meal plan that is best for you. Your meal plan may vary depending on factors such as:  · The calories you need.  · The medicines you take.  · Your weight.  · Your blood glucose, blood pressure, and cholesterol levels.  · Your activity level.  · Other health conditions you have, such as heart or kidney disease.  How do carbohydrates affect me?  Carbohydrates, also called carbs, affect your blood glucose level more than any other type of food. Eating carbs naturally raises the amount of glucose in your blood. Carb counting is a method for keeping track of how many carbs you eat. Counting carbs is important to keep your blood glucose at a healthy level, especially if you use insulin or take certain oral diabetes medicines.  It is important to know how many carbs you can safely have in each meal. This is different for every person. Your dietitian can help you calculate how many carbs you should have at each meal and for each snack.  Foods that contain carbs include:  · Bread, cereal, rice, pasta, and crackers.  · Potatoes and corn.  · Peas, beans, and lentils.  · Milk and yogurt.  · Fruit and juice.  · Desserts, such as cakes, cookies, ice cream, and candy.  How does alcohol affect me?  Alcohol can cause a sudden decrease in blood glucose (hypoglycemia),  especially if you use insulin or take certain oral diabetes medicines. Hypoglycemia can be a life-threatening condition. Symptoms of hypoglycemia (sleepiness, dizziness, and confusion) are similar to symptoms of having too much alcohol.  If your health care provider says that alcohol is safe for you, follow these guidelines:  · Limit alcohol intake to no more than 1 drink per day for nonpregnant women and 2 drinks per day for men. One drink equals 12 oz of beer, 5 oz of wine, or 1½ oz of hard liquor.  · Do not drink on an empty stomach.  · Keep yourself hydrated with water, diet soda, or unsweetened iced tea.  · Keep in mind that regular soda, juice, and other mixers may contain a lot of sugar and must be counted as carbs.  What are tips for following this plan?    Reading food labels  · Start by checking the serving size on the "Nutrition Facts" label of packaged foods and drinks. The amount of calories, carbs, fats, and other nutrients listed on the label is based on one serving of the item. Many items contain more than one serving per package.  · Check the total grams (g) of carbs in one serving. You can calculate the number of servings of carbs in one serving by dividing the total carbs by 15. For example, if a food has 30 g of total carbs, it would be equal to 2   servings of carbs.  · Check the number of grams (g) of saturated and trans fats in one serving. Choose foods that have low or no amount of these fats.  · Check the number of milligrams (mg) of salt (sodium) in one serving. Most people should limit total sodium intake to less than 2,300 mg per day.  · Always check the nutrition information of foods labeled as "low-fat" or "nonfat". These foods may be higher in added sugar or refined carbs and should be avoided.  · Talk to your dietitian to identify your daily goals for nutrients listed on the label.  Shopping  · Avoid buying canned, premade, or processed foods. These foods tend to be high in fat, sodium,  and added sugar.  · Shop around the outside edge of the grocery store. This includes fresh fruits and vegetables, bulk grains, fresh meats, and fresh dairy.  Cooking  · Use low-heat cooking methods, such as baking, instead of high-heat cooking methods like deep frying.  · Cook using healthy oils, such as olive, canola, or sunflower oil.  · Avoid cooking with butter, cream, or high-fat meats.  Meal planning  · Eat meals and snacks regularly, preferably at the same times every day. Avoid going long periods of time without eating.  · Eat foods high in fiber, such as fresh fruits, vegetables, beans, and whole grains. Talk to your dietitian about how many servings of carbs you can eat at each meal.  · Eat 4-6 ounces (oz) of lean protein each day, such as lean meat, chicken, fish, eggs, or tofu. One oz of lean protein is equal to:  ? 1 oz of meat, chicken, or fish.  ? 1 egg.  ? ¼ cup of tofu.  · Eat some foods each day that contain healthy fats, such as avocado, nuts, seeds, and fish.  Lifestyle  · Check your blood glucose regularly.  · Exercise regularly as told by your health care provider. This may include:  ? 150 minutes of moderate-intensity or vigorous-intensity exercise each week. This could be brisk walking, biking, or water aerobics.  ? Stretching and doing strength exercises, such as yoga or weightlifting, at least 2 times a week.  · Take medicines as told by your health care provider.  · Do not use any products that contain nicotine or tobacco, such as cigarettes and e-cigarettes. If you need help quitting, ask your health care provider.  · Work with a counselor or diabetes educator to identify strategies to manage stress and any emotional and social challenges.  Questions to ask a health care provider  · Do I need to meet with a diabetes educator?  · Do I need to meet with a dietitian?  · What number can I call if I have questions?  · When are the best times to check my blood glucose?  Where to find more  information:  · American Diabetes Association: diabetes.org  · Academy of Nutrition and Dietetics: www.eatright.org  · National Institute of Diabetes and Digestive and Kidney Diseases (NIH): www.niddk.nih.gov  Summary  · A healthy meal plan will help you control your blood glucose and maintain a healthy lifestyle.  · Working with a diet and nutrition specialist (dietitian) can help you make a meal plan that is best for you.  · Keep in mind that carbohydrates (carbs) and alcohol have immediate effects on your blood glucose levels. It is important to count carbs and to use alcohol carefully.  This information is not intended to   replace advice given to you by your health care provider. Make sure you discuss any questions you have with your health care provider.  Document Released: 07/29/2005 Document Revised: 06/01/2017 Document Reviewed: 12/06/2016  Elsevier Interactive Patient Education © 2019 [employer]vier Inc.

## 2018-11-11 LAB — BMP8+EGFR
BUN / CREAT RATIO: 16 (ref 10–24)
BUN: 21 mg/dL (ref 8–27)
CO2: 21 mmol/L (ref 20–29)
CREATININE: 1.29 mg/dL — AB (ref 0.76–1.27)
Calcium: 10 mg/dL (ref 8.6–10.2)
Chloride: 101 mmol/L (ref 96–106)
GFR calc non Af Amer: 58 mL/min/{1.73_m2} — ABNORMAL LOW (ref >59)
GFR, EST AFRICAN AMERICAN: 67 mL/min/{1.73_m2} (ref >59)
Glucose: 305 mg/dL — ABNORMAL HIGH (ref 65–99)
Potassium: 4.3 mmol/L (ref 3.5–5.2)
Sodium: 136 mmol/L (ref 134–144)

## 2018-11-17 ENCOUNTER — Encounter: Payer: Self-pay | Admitting: *Deleted

## 2018-11-20 DIAGNOSIS — M5136 Other intervertebral disc degeneration, lumbar region: Secondary | ICD-10-CM | POA: Diagnosis not present

## 2018-11-29 ENCOUNTER — Encounter: Payer: Self-pay | Admitting: *Deleted

## 2018-12-01 ENCOUNTER — Encounter: Payer: Self-pay | Admitting: Physician Assistant

## 2018-12-01 ENCOUNTER — Ambulatory Visit (INDEPENDENT_AMBULATORY_CARE_PROVIDER_SITE_OTHER): Payer: PPO | Admitting: Physician Assistant

## 2018-12-01 VITALS — BP 132/72 | HR 72 | Temp 97.8°F | Ht 71.0 in | Wt 206.0 lb

## 2018-12-01 DIAGNOSIS — N182 Chronic kidney disease, stage 2 (mild): Secondary | ICD-10-CM

## 2018-12-01 DIAGNOSIS — E1022 Type 1 diabetes mellitus with diabetic chronic kidney disease: Secondary | ICD-10-CM

## 2018-12-04 NOTE — Progress Notes (Signed)
BP 132/72   Pulse 72   Temp 97.8 F (36.6 C) (Oral)   Ht '5\' 11"'$  (1.803 m)   Wt 206 lb (93.4 kg)   BMI 28.73 kg/m    Subjective:    Patient ID: [name], male    DOB: 06/14/1956, 63 y.o.   MRN: [id number]  HPI: [name] is a 63 y.o. male presenting on 12/01/2018 for Diabetes (2 week follow up ) This patient comes in for 2-week recheck on his insulin change.  He is on [place] now.  He started out with 15 units daily.  He has build up to 21 units daily.  He denies having any issues with this.  On occasion he has had some elevated morning glucose readings.  We can attribute these to what he had eaten the night before.  He denies any new or changing issues.  All of his medications and conditions are reviewed.   Past Medical History:  Diagnosis Date  . Chronic kidney disease   . Complication of anesthesia    Hard to wake  . Degenerative joint disease (DJD) of lumbar spine   . Diabetes mellitus without complication ([place])    Type II  . Hypertension   . Lumbar stenosis   . Proteinuria   . Radicular leg pain    Relevant past medical, surgical, family and social history reviewed and updated as indicated. Interim medical history since our last visit reviewed. Allergies and medications reviewed and updated. DATA REVIEWED: CHART IN EPIC  Family History reviewed for pertinent findings.  Review of Systems  Constitutional: Negative.  Negative for appetite change and fatigue.  Eyes: Negative for pain and visual disturbance.  Respiratory: Negative.  Negative for cough, chest tightness, shortness of breath and wheezing.   Cardiovascular: Negative.  Negative for chest pain, palpitations and leg swelling.  Gastrointestinal: Negative.  Negative for abdominal pain, diarrhea, nausea and vomiting.  Genitourinary: Negative.   Skin: Negative.  Negative for color change and rash.  Neurological: Negative.  Negative for weakness, numbness and headaches.    Psychiatric/Behavioral: Negative.     Allergies as of 12/01/2018   No Known Allergies     Medication List       Accurate as of December 01, 2018 11:59 PM. Always use your most recent med list.        amLODipine 10 MG tablet Commonly known as:  NORVASC Take 1 tablet (10 mg total) by mouth daily.   CELEBREX 200 MG capsule Generic drug:  celecoxib Celebrex 200 mg capsule  Take 1 capsule every day by oral route at bedtime for 30 days.   cloNIDine 0.3 MG tablet Commonly known as:  CATAPRES Take 1 tablet (0.3 mg total) by mouth 2 (two) times daily.   finasteride 5 MG tablet Commonly known as:  PROSCAR Take 1 tablet (5 mg total) by mouth daily.   insulin degludec 100 UNIT/ML Sopn FlexTouch Pen Commonly known as:  TRESIBA FLEXTOUCH Inject 0.15 mLs (15 Units total) into the skin daily.   irbesartan 300 MG tablet Commonly known as:  AVAPRO irbesartan 300 mg tablet   metFORMIN 500 MG 24 hr tablet Commonly known as:  GLUCOPHAGE-XR Take 2 tablets (1,000 mg total) by mouth daily.   spironolactone 25 MG tablet Commonly known as:  ALDACTONE Take 1 tablet (25 mg total) by mouth daily.          Objective:    BP 132/72   Pulse 72   Temp  97.8 F (36.6 C) (Oral)   Ht '5\' 11"'$  (1.803 m)   Wt 206 lb (93.4 kg)   BMI 28.73 kg/m   No Known Allergies  Wt Readings from Last 3 Encounters:  12/01/18 206 lb (93.4 kg)  11/10/18 202 lb (91.6 kg)  09/01/18 208 lb 12.8 oz (94.7 kg)    Physical Exam Vitals signs and nursing note reviewed.  Constitutional:      General: He is not in acute distress.    Appearance: He is well-developed.  HENT:     Head: Normocephalic and atraumatic.  Eyes:     Conjunctiva/sclera: Conjunctivae normal.     Pupils: Pupils are equal, round, and reactive to light.  Cardiovascular:     Rate and Rhythm: Normal rate and regular rhythm.     Heart sounds: Normal heart sounds.  Pulmonary:     Effort: Pulmonary effort is normal. No respiratory distress.      Breath sounds: Normal breath sounds.  Skin:    General: Skin is warm and dry.  Psychiatric:        Behavior: Behavior normal.     Results for orders placed or performed in visit on 11/10/18  Bayer DCA Hb A1c Waived  Result Value Ref Range   HB A1C (BAYER DCA - WAIVED) 8.7 (H) <7.0 %  BMP8+EGFR  Result Value Ref Range   Glucose 305 (H) 65 - 99 mg/dL   BUN 21 8 - 27 mg/dL   Creatinine, Ser 1.29 (H) 0.76 - 1.27 mg/dL   GFR calc non Af Amer 58 (L) >59 mL/min/1.73   GFR calc Af Amer 67 >59 mL/min/1.73   BUN/Creatinine Ratio 16 10 - 24   Sodium 136 134 - 144 mmol/L   Potassium 4.3 3.5 - 5.2 mmol/L   Chloride 101 96 - 106 mmol/L   CO2 21 20 - 29 mmol/L   Calcium 10.0 8.6 - 10.2 mg/dL  Glucose Hemocue Waived  Result Value Ref Range   Glu Hemocue Waived 322 (H) 65 - 99 mg/dL  Urinalysis  Result Value Ref Range   Specific Gravity, UA 1.010 1.005 - 1.030   pH, UA 5.0 5.0 - 7.5   Color, UA Yellow Yellow   Appearance Ur Clear Clear   Leukocytes, UA Negative Negative   Protein, UA 1+ (A) Negative/Trace   Glucose, UA 2+ (A) Negative   Ketones, UA Negative Negative   RBC, UA Negative Negative   Bilirubin, UA Negative Negative   Urobilinogen, Ur 0.2 0.2 - 1.0 mg/dL   Nitrite, UA Negative Negative      Assessment & Plan:   1. 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   Continue all other maintenance medications as listed above.  Follow up plan: Return in about 2 months (around 01/30/2019) for check and lab.  Educational handout given for [place] PA-C [facility] [address]  [place], [place] [address] [phone]   12/04/2018, 1:56 PM

## 2018-12-19 DIAGNOSIS — Z5189 Encounter for other specified aftercare: Secondary | ICD-10-CM | POA: Diagnosis not present

## 2018-12-20 ENCOUNTER — Other Ambulatory Visit: Payer: PPO

## 2018-12-20 DIAGNOSIS — N182 Chronic kidney disease, stage 2 (mild): Secondary | ICD-10-CM | POA: Diagnosis not present

## 2018-12-20 DIAGNOSIS — E1022 Type 1 diabetes mellitus with diabetic chronic kidney disease: Secondary | ICD-10-CM | POA: Diagnosis not present

## 2018-12-20 DIAGNOSIS — R809 Proteinuria, unspecified: Secondary | ICD-10-CM | POA: Diagnosis not present

## 2018-12-20 DIAGNOSIS — D649 Anemia, unspecified: Secondary | ICD-10-CM | POA: Diagnosis not present

## 2018-12-20 DIAGNOSIS — Z79899 Other long term (current) drug therapy: Secondary | ICD-10-CM | POA: Diagnosis not present

## 2018-12-20 DIAGNOSIS — E1122 Type 2 diabetes mellitus with diabetic chronic kidney disease: Secondary | ICD-10-CM

## 2018-12-20 DIAGNOSIS — I10 Essential (primary) hypertension: Secondary | ICD-10-CM | POA: Diagnosis not present

## 2018-12-20 DIAGNOSIS — E559 Vitamin D deficiency, unspecified: Secondary | ICD-10-CM | POA: Diagnosis not present

## 2018-12-20 LAB — BAYER DCA HB A1C WAIVED: HB A1C (BAYER DCA - WAIVED): 8.4 % — ABNORMAL HIGH (ref <7.0)

## 2018-12-27 DIAGNOSIS — R809 Proteinuria, unspecified: Secondary | ICD-10-CM | POA: Diagnosis not present

## 2018-12-27 DIAGNOSIS — E559 Vitamin D deficiency, unspecified: Secondary | ICD-10-CM | POA: Diagnosis not present

## 2018-12-27 DIAGNOSIS — N182 Chronic kidney disease, stage 2 (mild): Secondary | ICD-10-CM | POA: Diagnosis not present

## 2018-12-27 DIAGNOSIS — E871 Hypo-osmolality and hyponatremia: Secondary | ICD-10-CM | POA: Diagnosis not present

## 2019-01-02 ENCOUNTER — Telehealth: Payer: Self-pay | Admitting: *Deleted

## 2019-01-02 ENCOUNTER — Other Ambulatory Visit: Payer: Self-pay | Admitting: Physician Assistant

## 2019-01-02 DIAGNOSIS — R0989 Other specified symptoms and signs involving the circulatory and respiratory systems: Secondary | ICD-10-CM

## 2019-01-02 NOTE — Telephone Encounter (Signed)
Order placed

## 2019-01-02 NOTE — Telephone Encounter (Signed)
Aware. 

## 2019-01-02 NOTE — Telephone Encounter (Signed)
Patient is stating that his feet are having numbness and staying cold. He is requesting referral

## 2019-01-16 DIAGNOSIS — Z981 Arthrodesis status: Secondary | ICD-10-CM | POA: Diagnosis not present

## 2019-01-19 ENCOUNTER — Encounter (INDEPENDENT_AMBULATORY_CARE_PROVIDER_SITE_OTHER): Payer: PPO

## 2019-02-03 ENCOUNTER — Other Ambulatory Visit: Payer: Self-pay | Admitting: Family Medicine

## 2019-02-03 DIAGNOSIS — E1122 Type 2 diabetes mellitus with diabetic chronic kidney disease: Secondary | ICD-10-CM

## 2019-02-03 DIAGNOSIS — N182 Chronic kidney disease, stage 2 (mild): Principal | ICD-10-CM

## 2019-03-05 ENCOUNTER — Other Ambulatory Visit: Payer: Self-pay

## 2019-03-06 ENCOUNTER — Encounter: Payer: Self-pay | Admitting: Physician Assistant

## 2019-03-06 ENCOUNTER — Other Ambulatory Visit: Payer: Self-pay

## 2019-03-06 ENCOUNTER — Ambulatory Visit (INDEPENDENT_AMBULATORY_CARE_PROVIDER_SITE_OTHER): Payer: PPO | Admitting: Physician Assistant

## 2019-03-06 VITALS — BP 136/73 | HR 77 | Temp 97.3°F | Ht 71.0 in | Wt 207.0 lb

## 2019-03-06 DIAGNOSIS — Z0000 Encounter for general adult medical examination without abnormal findings: Secondary | ICD-10-CM

## 2019-03-06 DIAGNOSIS — N182 Chronic kidney disease, stage 2 (mild): Secondary | ICD-10-CM | POA: Diagnosis not present

## 2019-03-06 DIAGNOSIS — R3914 Feeling of incomplete bladder emptying: Secondary | ICD-10-CM | POA: Diagnosis not present

## 2019-03-06 DIAGNOSIS — E1022 Type 1 diabetes mellitus with diabetic chronic kidney disease: Secondary | ICD-10-CM

## 2019-03-06 DIAGNOSIS — M25511 Pain in right shoulder: Secondary | ICD-10-CM | POA: Diagnosis not present

## 2019-03-06 DIAGNOSIS — N401 Enlarged prostate with lower urinary tract symptoms: Secondary | ICD-10-CM | POA: Diagnosis not present

## 2019-03-06 MED ORDER — METHOCARBAMOL 500 MG PO TABS: 500.0000 mg | ORAL_TABLET | Freq: Four times a day (QID) | ORAL | 2 refills | Status: DC

## 2019-03-06 NOTE — Progress Notes (Signed)
BP 136/73   Pulse 77   Temp (!) 97.3 F (36.3 C) (Oral)   Ht '5\' 11"'$  (1.803 m)   Wt 207 lb (93.9 kg)   BMI 28.87 kg/m    Subjective:    Patient ID: [name], male    DOB: 20-Apr-1956, 63 y.o.   MRN: [id number]  HPI: [name] is a 63 y.o. male presenting on 03/06/2019 for right shoulder pain and Hypertension (6 mo)  This patient comes in for recheck on his chronic medical conditions. Hypertension: He has been having good readings at home.  He is tolerating his medications very well.  He denies any new issues with his hypertensive medications.  He only has to see his nephrologist 1 time a year now  Diabetes: He states that he has had a great improvement in his glucose readings.  However he is typically staying 1 90-200 each morning.  We have gone over his medications.  He states that he is using the [place] insulin but ranges anywhere from 19 to 26 units when he uses it.  I have encouraged him to use this consistently at the higher amount with a goal of having fasting blood sugars around 150.  I have explained to him that this is a 24-hour insulin and he should not be afraid that it is going to drop his sugar too quickly.  He is not using a rapid insulin.  He understands the process we are going to have.  Right shoulder pain.  This is a new problem.  He has never really had problems with rotator cuff issues in his shoulder.  He has not had cervical issues on this side.  The pain is very acute in the anterior portion of the shoulder and to the lateral portion.  If he gets in a good position he has no pain.  However when it comes on it is very painful.  He is not able to lay on it at night.  He has found that he has to sleep in a recliner to have a position where he is pain-free.  We have discussed the need for possible orthopedic intervention and an injection.  We will start some medication and plan for a referral if things do not improve in the next week.  He continues to  have BPH and does need lab work performed today.  Past Medical History:  Diagnosis Date  . Chronic kidney disease   . Complication of anesthesia    Hard to wake  . Degenerative joint disease (DJD) of lumbar spine   . Diabetes mellitus without complication ([place])    Type II  . Hypertension   . Lumbar stenosis   . Proteinuria   . Radicular leg pain    Relevant past medical, surgical, family and social history reviewed and updated as indicated. Interim medical history since our last visit reviewed. Allergies and medications reviewed and updated. DATA REVIEWED: CHART IN EPIC  Family History reviewed for pertinent findings.  Review of Systems  Constitutional: Negative.  Negative for appetite change and fatigue.  Eyes: Negative for pain and visual disturbance.  Respiratory: Negative.  Negative for cough, chest tightness, shortness of breath and wheezing.   Cardiovascular: Negative.  Negative for chest pain, palpitations and leg swelling.  Gastrointestinal: Negative.  Negative for abdominal pain, diarrhea, nausea and vomiting.  Genitourinary: Negative.   Musculoskeletal: Positive for arthralgias and myalgias.  Skin: Negative.  Negative for color change and rash.  Neurological: Negative.  Negative  for weakness, numbness and headaches.  Psychiatric/Behavioral: Negative.     Allergies as of 03/06/2019   No Known Allergies     Medication List       Accurate as of March 06, 2019  2:34 PM. Always use your most recent med list.        amLODipine 10 MG tablet Commonly known as:  NORVASC Take 1 tablet (10 mg total) by mouth daily.   CeleBREX 200 MG capsule Generic drug:  celecoxib Celebrex 200 mg capsule  Take 1 capsule every day by oral route at bedtime for 30 days.   cloNIDine 0.3 MG tablet Commonly known as:  CATAPRES Take 1 tablet (0.3 mg total) by mouth 2 (two) times daily.   finasteride 5 MG tablet Commonly known as:  PROSCAR Take 1 tablet (5 mg total) by mouth daily.    insulin degludec 100 UNIT/ML Sopn FlexTouch Pen Commonly known as:  [name] FlexTouch Inject 0.15 mLs (15 Units total) into the skin daily.   irbesartan 300 MG tablet Commonly known as:  AVAPRO irbesartan 300 mg tablet   metFORMIN 500 MG 24 hr tablet Commonly known as:  GLUCOPHAGE-XR TAKE (2) TABLETS DAILY   methocarbamol 500 MG tablet Commonly known as:  Robaxin Take 1 tablet (500 mg total) by mouth 4 (four) times daily.   spironolactone 25 MG tablet Commonly known as:  Aldactone Take 1 tablet (25 mg total) by mouth daily.   tamsulosin 0.4 MG Caps capsule Commonly known as:  FLOMAX Take 1 capsule by mouth daily.          Objective:    BP 136/73   Pulse 77   Temp (!) 97.3 F (36.3 C) (Oral)   Ht '5\' 11"'$  (1.803 m)   Wt 207 lb (93.9 kg)   BMI 28.87 kg/m   No Known Allergies  Wt Readings from Last 3 Encounters:  03/06/19 207 lb (93.9 kg)  12/01/18 206 lb (93.4 kg)  11/10/18 202 lb (91.6 kg)    Physical Exam Vitals signs and nursing note reviewed.  Constitutional:      General: He is not in acute distress.    Appearance: He is well-developed.  HENT:     Head: Normocephalic and atraumatic.  Eyes:     Conjunctiva/sclera: Conjunctivae normal.     Pupils: Pupils are equal, round, and reactive to light.  Cardiovascular:     Rate and Rhythm: Normal rate and regular rhythm.     Heart sounds: Normal heart sounds.  Pulmonary:     Effort: Pulmonary effort is normal. No respiratory distress.     Breath sounds: Normal breath sounds.  Musculoskeletal:     Right shoulder: He exhibits tenderness and pain. He exhibits normal range of motion, no bony tenderness, no swelling, no effusion, no crepitus, no deformity, no spasm and normal strength.       Arms:  Skin:    General: Skin is warm and dry.  Psychiatric:        Behavior: Behavior normal.     Results for orders placed or performed in visit on 12/20/18  Bayer DCA Hb A1c Waived  Result Value Ref Range   HB  A1C (BAYER DCA - WAIVED) 8.4 (H) <7.0 %      Assessment & Plan:   1. Acute pain of right shoulder - methocarbamol (ROBAXIN) 500 MG tablet; Take 1 tablet (500 mg total) by mouth 4 (four) times daily.  Dispense: 90 tablet; Refill: 2  2. Type 1 diabetes mellitus  with stage 2 chronic kidney disease (HCC) - CBC with Differential/Platelet - CMP14+EGFR - Lipid panel - TSH  3. Well adult exam - TSH - PSA  4. Benign prostatic hyperplasia with incomplete bladder emptying - tamsulosin (FLOMAX) 0.4 MG CAPS capsule; Take 1 capsule by mouth daily. - PSA   Continue all other maintenance medications as listed above.  Follow up plan: Return in about 6 months (around 09/05/2019).  Educational handout given for [place] PA-C [place] [address]  [place], [place] [address] [phone]   03/06/2019, 2:34 PM

## 2019-03-07 LAB — CBC WITH DIFFERENTIAL/PLATELET
Basophils Absolute: 0.1 10*3/uL (ref 0.0–0.2)
Basos: 1 %
EOS (ABSOLUTE): 0.1 10*3/uL (ref 0.0–0.4)
Eos: 2 %
Hematocrit: 36.4 % — ABNORMAL LOW (ref 37.5–51.0)
Hemoglobin: 13.1 g/dL (ref 13.0–17.7)
Immature Grans (Abs): 0 10*3/uL (ref 0.0–0.1)
Immature Granulocytes: 0 %
Lymphocytes Absolute: 1.9 10*3/uL (ref 0.7–3.1)
Lymphs: 24 %
MCH: 31.1 pg (ref 26.6–33.0)
MCHC: 36 g/dL — ABNORMAL HIGH (ref 31.5–35.7)
MCV: 87 fL (ref 79–97)
Monocytes Absolute: 0.7 10*3/uL (ref 0.1–0.9)
Monocytes: 9 %
Neutrophils Absolute: 5.1 10*3/uL (ref 1.4–7.0)
Neutrophils: 64 %
Platelets: 210 10*3/uL (ref 150–450)
RBC: 4.21 x10E6/uL (ref 4.14–5.80)
RDW: 13 % (ref 11.6–15.4)
WBC: 7.9 10*3/uL (ref 3.4–10.8)

## 2019-03-07 LAB — CMP14+EGFR
ALT: 13 IU/L (ref 0–44)
AST: 12 IU/L (ref 0–40)
Albumin/Globulin Ratio: 2 (ref 1.2–2.2)
Albumin: 4.1 g/dL (ref 3.8–4.8)
Alkaline Phosphatase: 77 IU/L (ref 39–117)
BUN/Creatinine Ratio: 18 (ref 10–24)
BUN: 17 mg/dL (ref 8–27)
Bilirubin Total: 0.7 mg/dL (ref 0.0–1.2)
CO2: 21 mmol/L (ref 20–29)
Calcium: 9.7 mg/dL (ref 8.6–10.2)
Chloride: 103 mmol/L (ref 96–106)
Creatinine, Ser: 0.92 mg/dL (ref 0.76–1.27)
GFR calc Af Amer: 101 mL/min/{1.73_m2} (ref >59)
GFR calc non Af Amer: 87 mL/min/{1.73_m2} (ref >59)
Globulin, Total: 2.1 g/dL (ref 1.5–4.5)
Glucose: 196 mg/dL — ABNORMAL HIGH (ref 65–99)
Potassium: 4.2 mmol/L (ref 3.5–5.2)
Sodium: 139 mmol/L (ref 134–144)
Total Protein: 6.2 g/dL (ref 6.0–8.5)

## 2019-03-07 LAB — TSH: TSH: 3.34 u[IU]/mL (ref 0.450–4.500)

## 2019-03-07 LAB — LIPID PANEL
Chol/HDL Ratio: 4.1 ratio (ref 0.0–5.0)
Cholesterol, Total: 146 mg/dL (ref 100–199)
HDL: 36 mg/dL — ABNORMAL LOW (ref >39)
LDL Calculated: 78 mg/dL (ref 0–99)
Triglycerides: 158 mg/dL — ABNORMAL HIGH (ref 0–149)
VLDL Cholesterol Cal: 32 mg/dL (ref 5–40)

## 2019-03-07 LAB — PSA: Prostate Specific Ag, Serum: <0.1 ng/mL (ref 0.0–4.0)

## 2019-04-16 ENCOUNTER — Other Ambulatory Visit: Payer: Self-pay | Admitting: Family Medicine

## 2019-04-16 DIAGNOSIS — N182 Chronic kidney disease, stage 2 (mild): Secondary | ICD-10-CM

## 2019-04-16 DIAGNOSIS — E1122 Type 2 diabetes mellitus with diabetic chronic kidney disease: Secondary | ICD-10-CM

## 2019-05-02 ENCOUNTER — Other Ambulatory Visit: Payer: PPO

## 2019-05-02 ENCOUNTER — Other Ambulatory Visit: Payer: Self-pay

## 2019-05-02 DIAGNOSIS — I10 Essential (primary) hypertension: Secondary | ICD-10-CM | POA: Diagnosis not present

## 2019-05-02 DIAGNOSIS — N183 Chronic kidney disease, stage 3 (moderate): Secondary | ICD-10-CM | POA: Diagnosis not present

## 2019-05-02 DIAGNOSIS — E559 Vitamin D deficiency, unspecified: Secondary | ICD-10-CM | POA: Diagnosis not present

## 2019-05-02 DIAGNOSIS — Z79899 Other long term (current) drug therapy: Secondary | ICD-10-CM | POA: Diagnosis not present

## 2019-05-02 DIAGNOSIS — D509 Iron deficiency anemia, unspecified: Secondary | ICD-10-CM | POA: Diagnosis not present

## 2019-05-02 DIAGNOSIS — R809 Proteinuria, unspecified: Secondary | ICD-10-CM | POA: Diagnosis not present

## 2019-06-12 ENCOUNTER — Other Ambulatory Visit: Payer: Self-pay | Admitting: Family

## 2019-06-12 DIAGNOSIS — E1122 Type 2 diabetes mellitus with diabetic chronic kidney disease: Secondary | ICD-10-CM

## 2019-06-12 DIAGNOSIS — R739 Hyperglycemia, unspecified: Secondary | ICD-10-CM

## 2019-06-12 NOTE — Telephone Encounter (Signed)
OV 03/06/19 rtc 6 mos

## 2019-06-14 ENCOUNTER — Encounter: Payer: PPO | Admitting: *Deleted

## 2019-06-15 ENCOUNTER — Encounter: Payer: PPO | Admitting: *Deleted

## 2019-06-25 ENCOUNTER — Other Ambulatory Visit: Payer: Self-pay | Admitting: Physician Assistant

## 2019-06-25 ENCOUNTER — Telehealth: Payer: Self-pay | Admitting: Physician Assistant

## 2019-06-25 DIAGNOSIS — N182 Chronic kidney disease, stage 2 (mild): Secondary | ICD-10-CM

## 2019-06-25 DIAGNOSIS — R739 Hyperglycemia, unspecified: Secondary | ICD-10-CM

## 2019-06-25 DIAGNOSIS — E1122 Type 2 diabetes mellitus with diabetic chronic kidney disease: Secondary | ICD-10-CM

## 2019-06-25 MED ORDER — TRESIBA FLEXTOUCH 100 UNIT/ML ~~LOC~~ SOPN: 26.0000 [IU] | PEN_INJECTOR | Freq: Every day | SUBCUTANEOUS | 5 refills | Status: DC

## 2019-06-25 NOTE — Telephone Encounter (Signed)
Patient states he is taking 26 units of tresiba a day. Please advise he needs refill

## 2019-06-25 NOTE — Telephone Encounter (Signed)
Sent that he can go up to 60 if needed

## 2019-06-25 NOTE — Telephone Encounter (Signed)
Patient aware.

## 2019-06-29 DIAGNOSIS — Z981 Arthrodesis status: Secondary | ICD-10-CM | POA: Diagnosis not present

## 2019-07-19 ENCOUNTER — Ambulatory Visit (INDEPENDENT_AMBULATORY_CARE_PROVIDER_SITE_OTHER): Payer: PPO | Admitting: Family

## 2019-07-19 ENCOUNTER — Encounter: Payer: Self-pay | Admitting: Family

## 2019-07-19 DIAGNOSIS — J208 Acute bronchitis due to other specified organisms: Secondary | ICD-10-CM | POA: Diagnosis not present

## 2019-07-19 DIAGNOSIS — B9689 Other specified bacterial agents as the cause of diseases classified elsewhere: Secondary | ICD-10-CM

## 2019-07-19 MED ORDER — BENZONATATE 200 MG PO CAPS: 200.0000 mg | ORAL_CAPSULE | Freq: Three times a day (TID) | ORAL | 1 refills | Status: DC | PRN

## 2019-07-19 MED ORDER — AZITHROMYCIN 250 MG PO TABS: ORAL_TABLET | ORAL | 0 refills | Status: DC

## 2019-07-19 NOTE — Progress Notes (Signed)
   Virtual Visit via telephone Note Due to COVID-19 pandemic this visit was conducted virtually. This visit type was conducted due to national recommendations for restrictions regarding the COVID-19 Pandemic (e.g. social distancing, sheltering in place) in an effort to limit this patient's exposure and mitigate transmission in our community. All issues noted in this document were discussed and addressed.  A physical exam was not performed with this format.  I connected with [name] on 07/19/19 at 11:07 AM by telephone and verified that I am speaking with the correct person using two identifiers. [name] is currently located at home and wife is currently with him during visit. The provider, Evelina Dun, FNP is located in their office at time of visit.  I discussed the limitations, risks, security and privacy concerns of performing an evaluation and management service by telephone and the availability of in person appointments. I also discussed with the patient that there may be a patient responsible charge related to this service. The patient expressed understanding and agreed to proceed.   History and Present Illness:  Cough This is a new problem. The current episode started more than 1 month ago. The problem has been gradually worsening. The problem occurs every few minutes. The cough is productive of sputum. Associated symptoms include myalgias, postnasal drip and rhinorrhea. Pertinent negatives include no chills, ear congestion, ear pain, fever, headaches, nasal congestion or shortness of breath. The symptoms are aggravated by lying down. He has tried rest for the symptoms. The treatment provided no relief.      Review of Systems  Constitutional: Negative for chills and fever.  HENT: Positive for postnasal drip and rhinorrhea. Negative for ear pain.   Respiratory: Positive for cough. Negative for shortness of breath.   Musculoskeletal: Positive for myalgias.   Neurological: Negative for headaches.  All other systems reviewed and are negative.    Observations/Objective: No SOB or distress noted  Assessment and Plan: 1. Acute bacterial bronchitis -Pt will go get COVID tested today to rule  Rest Force fluids Mucinex BID  RTO in 1 week to recheck (for a telephone visit)  - azithromycin (ZITHROMAX) 250 MG tablet; Take 500 mg once, then 250 mg for four days  Dispense: 6 tablet; Refill: 0 - benzonatate (TESSALON) 200 MG capsule; Take 1 capsule (200 mg total) by mouth 3 (three) times daily as needed.  Dispense: 30 capsule; Refill: 1      I discussed the assessment and treatment plan with the patient. The patient was provided an opportunity to ask questions and all were answered. The patient agreed with the plan and demonstrated an understanding of the instructions.   The patient was advised to call back or seek an in-person evaluation if the symptoms worsen or if the condition fails to improve as anticipated.  The above assessment and management plan was discussed with the patient. The patient verbalized understanding of and has agreed to the management plan. Patient is aware to call the clinic if symptoms persist or worsen. Patient is aware when to return to the clinic for a follow-up visit. Patient educated on when it is appropriate to go to the emergency department.   Time call ended:  11:18 pm  I provided 11 minutes of non-face-to-face time during this encounter.    Evelina Dun, FNP

## 2019-08-17 ENCOUNTER — Other Ambulatory Visit: Payer: Self-pay | Admitting: Family Medicine

## 2019-08-17 DIAGNOSIS — E1122 Type 2 diabetes mellitus with diabetic chronic kidney disease: Secondary | ICD-10-CM

## 2019-09-04 ENCOUNTER — Other Ambulatory Visit: Payer: Self-pay | Admitting: Physician Assistant

## 2019-09-04 MED ORDER — SIMVASTATIN 20 MG PO TABS: 20.0000 mg | ORAL_TABLET | Freq: Every day | ORAL | 3 refills | Status: DC

## 2019-09-05 ENCOUNTER — Ambulatory Visit: Payer: PPO | Admitting: Physician Assistant

## 2019-09-06 ENCOUNTER — Other Ambulatory Visit: Payer: Self-pay | Admitting: Physician Assistant

## 2019-09-06 DIAGNOSIS — I10 Essential (primary) hypertension: Secondary | ICD-10-CM

## 2019-09-07 ENCOUNTER — Ambulatory Visit: Payer: PPO | Admitting: Physician Assistant

## 2019-09-26 ENCOUNTER — Other Ambulatory Visit: Payer: Self-pay | Admitting: Physician Assistant

## 2019-09-26 ENCOUNTER — Other Ambulatory Visit: Payer: Self-pay | Admitting: *Deleted

## 2019-09-26 DIAGNOSIS — I10 Essential (primary) hypertension: Secondary | ICD-10-CM

## 2019-09-26 DIAGNOSIS — R3914 Feeling of incomplete bladder emptying: Secondary | ICD-10-CM

## 2019-09-26 DIAGNOSIS — N401 Enlarged prostate with lower urinary tract symptoms: Secondary | ICD-10-CM

## 2019-09-26 MED ORDER — INSULIN PEN NEEDLE 32G X 4 MM MISC: 3 refills | Status: DC

## 2019-09-28 ENCOUNTER — Telehealth: Payer: Self-pay | Admitting: Physician Assistant

## 2019-10-01 ENCOUNTER — Other Ambulatory Visit: Payer: Self-pay

## 2019-10-01 ENCOUNTER — Encounter: Payer: Self-pay | Admitting: Physician Assistant

## 2019-10-01 ENCOUNTER — Ambulatory Visit (INDEPENDENT_AMBULATORY_CARE_PROVIDER_SITE_OTHER): Payer: PPO | Admitting: Physician Assistant

## 2019-10-01 VITALS — BP 119/72 | HR 70 | Temp 97.8°F | Ht 71.0 in | Wt 210.4 lb

## 2019-10-01 DIAGNOSIS — N401 Enlarged prostate with lower urinary tract symptoms: Secondary | ICD-10-CM | POA: Diagnosis not present

## 2019-10-01 DIAGNOSIS — N182 Chronic kidney disease, stage 2 (mild): Secondary | ICD-10-CM

## 2019-10-01 DIAGNOSIS — I10 Essential (primary) hypertension: Secondary | ICD-10-CM | POA: Diagnosis not present

## 2019-10-01 DIAGNOSIS — R3914 Feeling of incomplete bladder emptying: Secondary | ICD-10-CM

## 2019-10-01 DIAGNOSIS — E1122 Type 2 diabetes mellitus with diabetic chronic kidney disease: Secondary | ICD-10-CM

## 2019-10-01 DIAGNOSIS — R739 Hyperglycemia, unspecified: Secondary | ICD-10-CM | POA: Diagnosis not present

## 2019-10-01 LAB — BAYER DCA HB A1C WAIVED: HB A1C (BAYER DCA - WAIVED): 7.7 % — ABNORMAL HIGH (ref <7.0)

## 2019-10-01 MED ORDER — IRBESARTAN 300 MG PO TABS: ORAL_TABLET | ORAL | 3 refills | Status: DC

## 2019-10-01 MED ORDER — METFORMIN HCL ER 500 MG PO TB24: ORAL_TABLET | ORAL | 0 refills | Status: DC

## 2019-10-01 MED ORDER — GABAPENTIN 100 MG PO CAPS: 100.0000 mg | ORAL_CAPSULE | Freq: Every day | ORAL | 3 refills | Status: DC

## 2019-10-01 MED ORDER — TRESIBA FLEXTOUCH 100 UNIT/ML ~~LOC~~ SOPN: 26.0000 [IU] | PEN_INJECTOR | Freq: Every day | SUBCUTANEOUS | 5 refills | Status: DC

## 2019-10-01 MED ORDER — SPIRONOLACTONE 25 MG PO TABS: 25.0000 mg | ORAL_TABLET | Freq: Every day | ORAL | 3 refills | Status: DC

## 2019-10-01 MED ORDER — CLONIDINE HCL 0.3 MG PO TABS: 0.3000 mg | ORAL_TABLET | Freq: Two times a day (BID) | ORAL | 3 refills | Status: DC

## 2019-10-01 MED ORDER — AMLODIPINE BESYLATE 10 MG PO TABS: 10.0000 mg | ORAL_TABLET | Freq: Every day | ORAL | 3 refills | Status: DC

## 2019-10-01 MED ORDER — TAMSULOSIN HCL 0.4 MG PO CAPS: 0.4000 mg | ORAL_CAPSULE | Freq: Every day | ORAL | 3 refills | Status: DC

## 2019-10-02 LAB — CMP14+EGFR
ALT: 14 IU/L (ref 0–44)
AST: 14 IU/L (ref 0–40)
Albumin/Globulin Ratio: 2 (ref 1.2–2.2)
Albumin: 4.3 g/dL (ref 3.8–4.8)
Alkaline Phosphatase: 111 IU/L (ref 39–117)
BUN/Creatinine Ratio: 15 (ref 10–24)
BUN: 17 mg/dL (ref 8–27)
Bilirubin Total: 0.8 mg/dL (ref 0.0–1.2)
CO2: 22 mmol/L (ref 20–29)
Calcium: 9.7 mg/dL (ref 8.6–10.2)
Chloride: 97 mmol/L (ref 96–106)
Creatinine, Ser: 1.13 mg/dL (ref 0.76–1.27)
GFR calc Af Amer: 78 mL/min/{1.73_m2} (ref >59)
GFR calc non Af Amer: 68 mL/min/{1.73_m2} (ref >59)
Globulin, Total: 2.1 g/dL (ref 1.5–4.5)
Glucose: 264 mg/dL — ABNORMAL HIGH (ref 65–99)
Potassium: 4.2 mmol/L (ref 3.5–5.2)
Sodium: 134 mmol/L (ref 134–144)
Total Protein: 6.4 g/dL (ref 6.0–8.5)

## 2019-10-02 LAB — LIPID PANEL
Chol/HDL Ratio: 3.7 ratio (ref 0.0–5.0)
Cholesterol, Total: 138 mg/dL (ref 100–199)
HDL: 37 mg/dL — ABNORMAL LOW (ref >39)
LDL Chol Calc (NIH): 71 mg/dL (ref 0–99)
Triglycerides: 175 mg/dL — ABNORMAL HIGH (ref 0–149)
VLDL Cholesterol Cal: 30 mg/dL (ref 5–40)

## 2019-10-07 NOTE — Progress Notes (Signed)
BP 119/72   Pulse 70   Temp 97.8 F (36.6 C) (Temporal)   Ht _0  (1.803 m)   Wt 210 lb 6.4 oz (95.4 kg)   SpO2 98%   BMI 29.34 kg/m    Subjective:    Patient ID: [name], male    DOB: 10/08/1956, 63 y.o.   MRN: [id number]  HPI: [name] is a 63 y.o. male presenting on 10/01/2019 for Diabetes (6 mth ) and Hypertension  This patient comes in to have a follow-up visit on his chronic medical conditions.  They do include degenerative joint disease, diabetes, hypertension, chronic kidney disease.  He states that overall he is doing well.  He does not have anything significantly going on.  He states that his sugars have been in the mid 100s each morning.  He denies having any other new issues.  All of his medications are reviewed and refills will be sent.  Past Medical History:  Diagnosis Date  . Chronic kidney disease   . Complication of anesthesia    Hard to wake  . Degenerative joint disease (DJD) of lumbar spine   . Diabetes mellitus without complication ([place])    Type II  . Hypertension   . Lumbar stenosis   . Proteinuria   . Radicular leg pain    Relevant past medical, surgical, family and social history reviewed and updated as indicated. Interim medical history since our last visit reviewed. Allergies and medications reviewed and updated. DATA REVIEWED: CHART IN EPIC  Family History reviewed for pertinent findings.  Review of Systems  Constitutional: Negative.  Negative for appetite change and fatigue.  HENT: Negative.   Eyes: Negative.  Negative for pain and visual disturbance.  Respiratory: Negative.  Negative for cough, chest tightness, shortness of breath and wheezing.   Cardiovascular: Negative.  Negative for chest pain, palpitations and leg swelling.  Gastrointestinal: Negative.  Negative for abdominal pain, diarrhea, nausea and vomiting.  Endocrine: Negative.   Genitourinary: Negative.   Musculoskeletal: Negative.   Skin: Negative.   Negative for color change and rash.  Neurological: Negative.  Negative for weakness, numbness and headaches.  Psychiatric/Behavioral: Negative.     Allergies as of 10/01/2019   No Known Allergies     Medication List       Accurate as of October 01, 2019 11:59 PM. If you have any questions, ask your nurse or doctor.        STOP taking these medications   azithromycin 250 MG tablet Commonly known as: ZITHROMAX Stopped by: Terald Sleeper, PA-C   benzonatate 200 MG capsule Commonly known as: TESSALON Stopped by: Terald Sleeper, PA-C   simvastatin 20 MG tablet Commonly known as: ZOCOR Stopped by: Terald Sleeper, PA-C     TAKE these medications   amLODipine 10 MG tablet Commonly known as: NORVASC Take 1 tablet (10 mg total) by mouth daily.   CeleBREX 200 MG capsule Generic drug: celecoxib Celebrex 200 mg capsule  Take 1 capsule every day by oral route at bedtime for 30 days.   cloNIDine 0.3 MG tablet Commonly known as: CATAPRES Take 1 tablet (0.3 mg total) by mouth 2 (two) times daily.   finasteride 5 MG tablet Commonly known as: PROSCAR Take 1 tablet (5 mg total) by mouth daily.   gabapentin 100 MG capsule Commonly known as: NEURONTIN Take 1-3 capsules (100-300 mg total) by mouth at bedtime. Started by: Terald Sleeper, PA-C   Insulin Pen Needle 32G  X 4 MM Misc Use with insulin daily Dx E10.22   irbesartan 300 MG tablet Commonly known as: AVAPRO irbesartan 300 mg tablet   metFORMIN 500 MG 24 hr tablet Commonly known as: GLUCOPHAGE-XR Take one tab BID What changed: See the new instructions. Changed by: Terald Sleeper, PA-C   methocarbamol 500 MG tablet Commonly known as: Robaxin Take 1 tablet (500 mg total) by mouth 4 (four) times daily.   spironolactone 25 MG tablet Commonly known as: Aldactone Take 1 tablet (25 mg total) by mouth daily.   tamsulosin 0.4 MG Caps capsule Commonly known as: FLOMAX Take 1 capsule (0.4 mg total) by mouth daily.    [name] FlexTouch 100 UNIT/ML Sopn FlexTouch Pen Generic drug: insulin degludec Inject 0.26-0.6 mLs (26-60 Units total) into the skin daily. Increase by one unit daily until fasting sugar is 150 What changed: additional instructions Changed by: Terald Sleeper, PA-C          Objective:    BP 119/72   Pulse 70   Temp 97.8 F (36.6 C) (Temporal)   Ht _0  (1.803 m)   Wt 210 lb 6.4 oz (95.4 kg)   SpO2 98%   BMI 29.34 kg/m   No Known Allergies  Wt Readings from Last 3 Encounters:  10/01/19 210 lb 6.4 oz (95.4 kg)  03/06/19 207 lb (93.9 kg)  12/01/18 206 lb (93.4 kg)    Physical Exam Vitals signs and nursing note reviewed.  Constitutional:      General: He is not in acute distress.    Appearance: He is well-developed.  HENT:     Head: Normocephalic and atraumatic.  Eyes:     Conjunctiva/sclera: Conjunctivae normal.     Pupils: Pupils are equal, round, and reactive to light.  Cardiovascular:     Rate and Rhythm: Normal rate and regular rhythm.     Heart sounds: Normal heart sounds.  Pulmonary:     Effort: Pulmonary effort is normal. No respiratory distress.     Breath sounds: Normal breath sounds.  Skin:    General: Skin is warm and dry.  Psychiatric:        Behavior: Behavior normal.     Results for orders placed or performed in visit on 10/01/19  hgba1c  Result Value Ref Range   HB A1C (BAYER DCA - WAIVED) 7.7 (H) <7.0 %  Lipid Panel  Result Value Ref Range   Cholesterol, Total 138 100 - 199 mg/dL   Triglycerides 175 (H) 0 - 149 mg/dL   HDL 37 (L) >39 mg/dL   VLDL Cholesterol Cal 30 5 - 40 mg/dL   LDL Chol Calc (NIH) 71 0 - 99 mg/dL   Chol/HDL Ratio 3.7 0.0 - 5.0 ratio  CMP14+EGFR  Result Value Ref Range   Glucose 264 (H) 65 - 99 mg/dL   BUN 17 8 - 27 mg/dL   Creatinine, Ser 1.13 0.76 - 1.27 mg/dL   GFR calc non Af Amer 68 >59 mL/min/1.73   GFR calc Af Amer 78 >59 mL/min/1.73   BUN/Creatinine Ratio 15 10 - 24   Sodium 134 134 - 144 mmol/L    Potassium 4.2 3.5 - 5.2 mmol/L   Chloride 97 96 - 106 mmol/L   CO2 22 20 - 29 mmol/L   Calcium 9.7 8.6 - 10.2 mg/dL   Total Protein 6.4 6.0 - 8.5 g/dL   Albumin 4.3 3.8 - 4.8 g/dL   Globulin, Total 2.1 1.5 - 4.5 g/dL   Albumin/Globulin  Ratio 2.0 1.2 - 2.2   Bilirubin Total 0.8 0.0 - 1.2 mg/dL   Alkaline Phosphatase 111 39 - 117 IU/L   AST 14 0 - 40 IU/L   ALT 14 0 - 44 IU/L      Assessment & Plan:   1. Type 2 diabetes mellitus with stage 2 chronic kidney disease, without long-term current use of insulin (HCC) - hgba1c - insulin degludec (TRESIBA FLEXTOUCH) 100 UNIT/ML SOPN FlexTouch Pen; Inject 0.26-0.6 mLs (26-60 Units total) into the skin daily. Increase by one unit daily until fasting sugar is 150  Dispense: 30 mL; Refill: 5 - metFORMIN (GLUCOPHAGE-XR) 500 MG 24 hr tablet; Take one tab BID  Dispense: 180 tablet; Refill: 0 - Lipid Panel - CMP14+EGFR  2. Benign prostatic hyperplasia with incomplete bladder emptying - tamsulosin (FLOMAX) 0.4 MG CAPS capsule; Take 1 capsule (0.4 mg total) by mouth daily.  Dispense: 90 capsule; Refill: 3  3. Essential hypertension - amLODipine (NORVASC) 10 MG tablet; Take 1 tablet (10 mg total) by mouth daily.  Dispense: 90 tablet; Refill: 3 - cloNIDine (CATAPRES) 0.3 MG tablet; Take 1 tablet (0.3 mg total) by mouth 2 (two) times daily.  Dispense: 180 tablet; Refill: 3 - irbesartan (AVAPRO) 300 MG tablet; irbesartan 300 mg tablet  Dispense: 90 tablet; Refill: 3 - spironolactone (ALDACTONE) 25 MG tablet; Take 1 tablet (25 mg total) by mouth daily.  Dispense: 90 tablet; Refill: 3  4. Hyperglycemia - insulin degludec (TRESIBA FLEXTOUCH) 100 UNIT/ML SOPN FlexTouch Pen; Inject 0.26-0.6 mLs (26-60 Units total) into the skin daily. Increase by one unit daily until fasting sugar is 150  Dispense: 30 mL; Refill: 5   Continue all other maintenance medications as listed above.  Follow up plan: Return in about 3 months (around 01/01/2020) for recheck  medications.  Educational handout given for [place] PA-C [facility] [address]  [place], [place] [address] [phone]   10/07/2019, 3:05 PM

## 2019-10-15 ENCOUNTER — Other Ambulatory Visit: Payer: Self-pay

## 2019-10-15 ENCOUNTER — Ambulatory Visit
Admission: EM | Admit: 2019-10-15 | Discharge: 2019-10-15 | Disposition: A | Discharge status: Home / Self Care | Payer: PPO | Attending: Emergency Medicine | Admitting: Emergency Medicine

## 2019-10-15 DIAGNOSIS — Z20822 Contact with and (suspected) exposure to covid-19: Secondary | ICD-10-CM

## 2019-10-15 DIAGNOSIS — U071 COVID-19: Secondary | ICD-10-CM

## 2019-10-15 DIAGNOSIS — Z20828 Contact with and (suspected) exposure to other viral communicable diseases: Secondary | ICD-10-CM | POA: Diagnosis not present

## 2019-10-15 DIAGNOSIS — R739 Hyperglycemia, unspecified: Secondary | ICD-10-CM

## 2019-10-15 LAB — POC SARS CORONAVIRUS 2 AG -  ED: SARS Coronavirus 2 Ag: POSITIVE — AB

## 2019-10-15 LAB — POCT FASTING CBG KUC MANUAL ENTRY

## 2019-10-15 NOTE — ED Provider Notes (Signed)
HPI  SUBJECTIVE:  [name] is a 63 y.o. male who presents with fatigue, body aches, cough, shortness of breath for about a week.  No fevers, headaches, nasal congestion.  Had a sore throat yesterday which has resolved.  States that he has not been able to smell for 5 or 6 years, but his sense of taste is still present.  States that the cough is getting better.  He reports 1 watery nonbloody episode of diarrhea a day during this time.  No nausea, vomiting, abdominal pain.  He was exposed to Covid on 11/16.  His wife also has a cough and fatigue.  He has tried over-the-counter cough medicine with improvement in his symptoms.  No aggravating factors.  No antipyretic in the past 4 to 6 hours, he is sleeping well at night without waking up coughing.  He has a past medical history of hypertension, chronic kidney disease, diabetes type 2 for which he takes insulin.  He states that his sugar this morning was 198, which is baseline for him.  No history of pulmonary disease, smoking, DKA, coronary disease, HIV, immunocompromise, cancer.  PMD: Theodoro Clock  Past Medical History:  Diagnosis Date  . Chronic kidney disease   . Complication of anesthesia    Hard to wake  . Degenerative joint disease (DJD) of lumbar spine   . Diabetes mellitus without complication ([place])    Type II  . Hypertension   . Lumbar stenosis   . Proteinuria   . Radicular leg pain     Past Surgical History:  Procedure Laterality Date  . BELPHAROPTOSIS REPAIR    . CARDIAC CATHETERIZATION    . EYE SURGERY    . HEMORRHOID SURGERY    . TRANSFORAMINAL LUMBAR INTERBODY FUSION (TLIF) WITH PEDICLE SCREW FIXATION 2 LEVEL N/A 07/06/2018   Procedure: TRANSFORAMINAL LUMBAR INTERBODY FUSION (TLIF) L4-S1;  Surgeon: Melina Schools, MD;  Location: [facility];  Service: Orthopedics;  Laterality: N/A;    Family History  Problem Relation Age of Onset  . Heart attack Father   . Hypertension Father   . Vision loss Sister    one eye  . Cancer Brother        skin  . Diabetes Brother     Social History   Tobacco Use  . Smoking status: Never Smoker  . Smokeless tobacco: Never Used  Substance Use Topics  . Alcohol use: Yes    Alcohol/week: 8.0 standard drinks    Types: 8 Cans of beer per week  . Drug use: No    No current facility-administered medications for this encounter.   Current Outpatient Medications:  .  amLODipine (NORVASC) 10 MG tablet, Take 1 tablet (10 mg total) by mouth daily., Disp: 90 tablet, Rfl: 3 .  cloNIDine (CATAPRES) 0.3 MG tablet, Take 1 tablet (0.3 mg total) by mouth 2 (two) times daily., Disp: 180 tablet, Rfl: 3 .  finasteride (PROSCAR) 5 MG tablet, Take 1 tablet (5 mg total) by mouth daily., Disp: 90 tablet, Rfl: 3 .  gabapentin (NEURONTIN) 100 MG capsule, Take 1-3 capsules (100-300 mg total) by mouth at bedtime., Disp: 90 capsule, Rfl: 3 .  insulin degludec (TRESIBA FLEXTOUCH) 100 UNIT/ML SOPN FlexTouch Pen, Inject 0.26-0.6 mLs (26-60 Units total) into the skin daily. Increase by one unit daily until fasting sugar is 150, Disp: 30 mL, Rfl: 5 .  Insulin Pen Needle 32G X 4 MM MISC, Use with insulin daily Dx E10.22, Disp: 100 each, Rfl: 3 .  irbesartan (AVAPRO) 300 MG tablet, irbesartan 300 mg tablet, Disp: 90 tablet, Rfl: 3 .  metFORMIN (GLUCOPHAGE-XR) 500 MG 24 hr tablet, Take one tab BID, Disp: 180 tablet, Rfl: 0 .  methocarbamol (ROBAXIN) 500 MG tablet, Take 1 tablet (500 mg total) by mouth 4 (four) times daily., Disp: 90 tablet, Rfl: 2 .  spironolactone (ALDACTONE) 25 MG tablet, Take 1 tablet (25 mg total) by mouth daily., Disp: 90 tablet, Rfl: 3 .  tamsulosin (FLOMAX) 0.4 MG CAPS capsule, Take 1 capsule (0.4 mg total) by mouth daily., Disp: 90 capsule, Rfl: 3  No Known Allergies   ROS  As noted in HPI.   Physical Exam  BP 125/72   Pulse 72   Temp 98.2 F (36.8 C)   Resp 18   SpO2 97%   Constitutional: Well developed, well nourished, no acute distress Eyes:  EOMI,  conjunctiva normal bilaterally HENT: Normocephalic, atraumatic,mucus membranes moist Respiratory: Normal inspiratory effort lungs clear bilaterally, good air movement Cardiovascular: Normal rate regular rhythm no murmurs rubs or gallops GI: nondistended skin: No rash, skin intact Musculoskeletal: no deformities Neurologic: Alert & oriented x 3, no focal neuro deficits Psychiatric: Speech and behavior appropriate   ED Course   Medications - No data to display  Orders Placed This Encounter  Procedures  . POCT CBG (manual entry)    Standing Status:   Standing    Number of Occurrences:   1  . POC SARS Coronavirus 2 Ag-ED - Nasal Swab (BD Veritor Kit)    Standing Status:   Standing    Number of Occurrences:   1    Order Specific Question:   Is this test for diagnosis or screening    Answer:   Diagnosis of ill patient    Order Specific Question:   Symptomatic for COVID-19 as defined by CDC    Answer:   Yes    Order Specific Question:   Date of Symptom Onset    Answer:   10/08/2019    Order Specific Question:   Hospitalized for COVID-19    Answer:   No    Order Specific Question:   Admitted to ICU for COVID-19    Answer:   No    Order Specific Question:   Previously tested for COVID-19    Answer:   No    Order Specific Question:   Resident in a congregate (group) care setting    Answer:   No    Order Specific Question:   Employed in healthcare setting    Answer:   No    Results for orders placed or performed during the hospital encounter of 10/15/19 (from the past 24 hour(s))  POCT CBG (manual entry)     Status: None   Collection Time: 10/15/19  4:16 PM  Result Value Ref Range   POCT Glucose ([facility])    POC SARS Coronavirus 2 Ag-ED - Nasal Swab (BD Veritor Kit)     Status: Abnormal   Collection Time: 10/15/19  4:28 PM  Result Value Ref Range   SARS Coronavirus 2 Ag Positive (A) Negative   No results found.  ED Clinical Impression  1. COVID-19 virus infection   2.  Suspected COVID-19 virus infection   3. Hyperglycemia      ED Assessment/Plan  Patient Covid positive.  States that he is doing well with his over-the-counter cold medication and does not need a prescription for anything.  Glucose noted.  Drank a sugary green tea immediately prior to  his glucose being taken.  States that it always "shoots up" when he drinks this green tea.  He states that he will address it with his sliding scale insulin when he gets home.   Discussed labs,MDM, treatment plan, and plan for follow-up with patient. Discussed sn/sx that should prompt return to the ED. patient agrees with plan.   No orders of the defined types were placed in this encounter.   *This clinic note was created using Dragon dictation software. Therefore, there may be occasional mistakes despite careful proofreading.   ?    Melynda Ripple, MD 10/16/19 1024

## 2019-10-15 NOTE — Discharge Instructions (Addendum)
You can take 1 g of Tylenol 3 or 4 times a day as needed for pain.  You can use ibuprofen, but I would use it very sparingly.  Wear a mask at all times when you are around other people.  Strict self isolation y until 10 days since onset of your symptoms, you your respiratory symptoms have improved, and you have been fever free for 24 hours without the use of Tylenol, ibuprofen or other over-the-counter cold medication.    your blood sugar was 399 here. Continue pushing plenty of fluids.  Try some herbal tea that does not have sugar in it to try to keep your sugars from going up so high.  Please make sure you address your blood sugar soon as you get home.

## 2019-10-15 NOTE — ED Triage Notes (Signed)
Pt presents with c/o fatigue cough, sob with exertion, cough and episodes of diarrhea

## 2019-10-15 NOTE — ED Notes (Signed)
cbg 399

## 2019-10-24 ENCOUNTER — Other Ambulatory Visit: Payer: Self-pay | Admitting: Physician Assistant

## 2019-10-24 DIAGNOSIS — R3914 Feeling of incomplete bladder emptying: Secondary | ICD-10-CM

## 2019-10-24 DIAGNOSIS — N401 Enlarged prostate with lower urinary tract symptoms: Secondary | ICD-10-CM

## 2019-12-06 ENCOUNTER — Encounter: Payer: Self-pay | Admitting: Family Medicine

## 2019-12-06 ENCOUNTER — Ambulatory Visit (INDEPENDENT_AMBULATORY_CARE_PROVIDER_SITE_OTHER): Payer: PPO | Admitting: Family Medicine

## 2019-12-06 ENCOUNTER — Other Ambulatory Visit: Payer: Self-pay

## 2019-12-06 VITALS — BP 133/75 | HR 76 | Temp 98.6°F | Ht 71.0 in | Wt 215.2 lb

## 2019-12-06 DIAGNOSIS — H9192 Unspecified hearing loss, left ear: Secondary | ICD-10-CM | POA: Diagnosis not present

## 2019-12-06 DIAGNOSIS — H6122 Impacted cerumen, left ear: Secondary | ICD-10-CM | POA: Diagnosis not present

## 2019-12-06 NOTE — Progress Notes (Signed)
Chief Complaint  Patient presents with  . Trouble Hearing    left ear    HPI  Patient presents today for wax in left ear. Used a qtip after the shower 2-3 days ago. Can't hear on left since then. No problem with right ear  PMH: Smoking status noted ROS: Per HPI  Objective: BP 133/75   Pulse 76   Temp 98.6 F (37 C) (Temporal)   Ht 5\' 11"  (1.803 m)   Wt 215 lb 3.2 oz (97.6 kg)   BMI 30.01 kg/m  Gen: NAD, alert, cooperative with exam HEENT: NCAT, EOMI, PERRL.  There is copious wax buildup in the left ear canal.  The TM could not be visualized initially.  The right canal was within normal limits.  TM appeared intact without erythema or signs of infection. CV: RRR, good S1/S2, no murmur Ext: No edema, warm Neuro: Alert and oriented, No gross deficits  Assessment and plan:  1. Impacted cerumen of left ear   2. Decreased hearing of left ear     Ear lavage was performed on the left with complete resolution of the impaction.  TM appeared normal afterwards and hearing was restored.  Patient was instructed on proper ear care.  He can use water from the shower or over-the-counter Debrox as needed but avoid putting foreign objects into the ear canal.    Follow up as needed.  Claretta Fraise, MD

## 2019-12-26 ENCOUNTER — Other Ambulatory Visit: Payer: Self-pay | Admitting: Physician Assistant

## 2019-12-26 DIAGNOSIS — E1122 Type 2 diabetes mellitus with diabetic chronic kidney disease: Secondary | ICD-10-CM

## 2020-01-01 ENCOUNTER — Ambulatory Visit (INDEPENDENT_AMBULATORY_CARE_PROVIDER_SITE_OTHER): Payer: PPO | Admitting: Physician Assistant

## 2020-01-01 ENCOUNTER — Encounter: Payer: Self-pay | Admitting: Physician Assistant

## 2020-01-01 DIAGNOSIS — N185 Chronic kidney disease, stage 5: Secondary | ICD-10-CM

## 2020-01-01 DIAGNOSIS — E1022 Type 1 diabetes mellitus with diabetic chronic kidney disease: Secondary | ICD-10-CM | POA: Diagnosis not present

## 2020-01-01 DIAGNOSIS — I120 Hypertensive chronic kidney disease with stage 5 chronic kidney disease or end stage renal disease: Secondary | ICD-10-CM | POA: Diagnosis not present

## 2020-01-01 DIAGNOSIS — N182 Chronic kidney disease, stage 2 (mild): Secondary | ICD-10-CM

## 2020-01-01 DIAGNOSIS — I10 Essential (primary) hypertension: Secondary | ICD-10-CM | POA: Diagnosis not present

## 2020-01-01 MED ORDER — METFORMIN HCL ER 500 MG PO TB24: ORAL_TABLET | ORAL | 0 refills | Status: DC

## 2020-01-01 MED ORDER — METFORMIN HCL ER 500 MG PO TB24: 1000.0000 mg | ORAL_TABLET | Freq: Two times a day (BID) | ORAL | 1 refills | Status: DC

## 2020-01-01 NOTE — Progress Notes (Signed)
Telephone visit  Subjective: XT:[id number] on chronic conditions PCP: Terald Sleeper, PA-C IAX:[id number] [name] is a 64 y.o. male calls for telephone consult today. Patient provides verbal consent for consult held via phone.  Patient is identified with 2 separate identifiers.  At this time the entire area is on COVID-19 social distancing and stay home orders are in place.  Patient is of higher risk and therefore we are performing this by a virtual method.  Location of patient: home Location of provider: HOME Others present for call: no   Patient is having a follow-up on his chronic medical conditions.  He does have essential hypertension, type 1 diabetes, chronic kidney disease.  All of his medications are reviewed.  He does need refill on Metformin.  He will have labs performed in the near future.  He is trying to work on diet and is down about 24pounds it is a note he did have Covid in November but is feeling better from it.  He states fatigue was the worst thing for him.  He notices that the most affected for his sugar was his diet.  ROS: Per HPI  No Known Allergies Past Medical History:  Diagnosis Date  . Chronic kidney disease   . Complication of anesthesia    Hard to wake  . Degenerative joint disease (DJD) of lumbar spine   . Diabetes mellitus without complication ([place])    Type II  . Hypertension   . Lumbar stenosis   . Proteinuria   . Radicular leg pain     Current Outpatient Medications:  .  amLODipine (NORVASC) 10 MG tablet, Take 1 tablet (10 mg total) by mouth daily., Disp: 90 tablet, Rfl: 3 .  cloNIDine (CATAPRES) 0.3 MG tablet, Take 1 tablet (0.3 mg total) by mouth 2 (two) times daily., Disp: 180 tablet, Rfl: 3 .  finasteride (PROSCAR) 5 MG tablet, Take 1 tablet (5 mg total) by mouth daily., Disp: 90 tablet, Rfl: 1 .  gabapentin (NEURONTIN) 100 MG capsule, Take 1-3 capsules (100-300 mg total) by mouth at bedtime., Disp: 90 capsule, Rfl: 3 .  insulin  degludec (TRESIBA FLEXTOUCH) 100 UNIT/ML SOPN FlexTouch Pen, Inject 0.26-0.6 mLs (26-60 Units total) into the skin daily. Increase by one unit daily until fasting sugar is 150, Disp: 30 mL, Rfl: 5 .  Insulin Pen Needle 32G X 4 MM MISC, Use with insulin daily Dx E10.22, Disp: 100 each, Rfl: 3 .  irbesartan (AVAPRO) 300 MG tablet, irbesartan 300 mg tablet, Disp: 90 tablet, Rfl: 3 .  metFORMIN (GLUCOPHAGE-XR) 500 MG 24 hr tablet, Take 2 tablets (1,000 mg total) by mouth 2 (two) times daily. TAKE  (1)  TABLET TWICE A DAY., Disp: 360 tablet, Rfl: 1 .  methocarbamol (ROBAXIN) 500 MG tablet, Take 1 tablet (500 mg total) by mouth 4 (four) times daily., Disp: 90 tablet, Rfl: 2 .  spironolactone (ALDACTONE) 25 MG tablet, Take 1 tablet (25 mg total) by mouth daily., Disp: 90 tablet, Rfl: 3 .  tamsulosin (FLOMAX) 0.4 MG CAPS capsule, Take 1 capsule (0.4 mg total) by mouth daily., Disp: 90 capsule, Rfl: 3  Assessment/ Plan: 64 y.o. male    1. 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 TAKE  (1)  TABLET TWICE A DAY.  Dispense: 360 tablet; Refill: 1  2. Hypertensive kidney disease with CKD (chronic kidney disease) stage V (HCC) - CBC with Differential/Platelet; Future - CMP14+EGFR; Future - Lipid panel; Future - TSH; Future - Microalbumin / creatinine urine ratio; Future  3. Essential hypertension - CMP14+EGFR; Future - Microalbumin / creatinine urine ratio; Future    No follow-ups on file.  Continue all other maintenance medications as listed above.  Start time: 11:12 AM End time: 11:25 AM  Meds ordered this encounter  Medications  . DISCONTD: metFORMIN (GLUCOPHAGE-XR) 500 MG 24  hr tablet    Sig: TAKE  (1)  TABLET TWICE A DAY.    Dispense:  180 tablet    Refill:  0    Order Specific Question:   Supervising Provider    Answer:   [name] [[id number]]  . metFORMIN (GLUCOPHAGE-XR) 500 MG 24 hr tablet    Sig: Take 2 tablets (1,000 mg total) by mouth 2 (two) times daily. TAKE  (1)  TABLET TWICE A DAY.    Dispense:  360 tablet    Refill:  1    Order Specific Question:   Supervising Provider    Answer:   [name] [[id number]]    Particia Nearing PA-C [facility] [phone]

## 2020-03-07 ENCOUNTER — Ambulatory Visit (INDEPENDENT_AMBULATORY_CARE_PROVIDER_SITE_OTHER): Payer: PPO

## 2020-03-07 ENCOUNTER — Encounter: Payer: Self-pay | Admitting: Family Medicine

## 2020-03-07 VITALS — BP 160/80

## 2020-03-07 DIAGNOSIS — Z0000 Encounter for general adult medical examination without abnormal findings: Secondary | ICD-10-CM | POA: Diagnosis not present

## 2020-03-07 NOTE — Progress Notes (Signed)
MEDICARE ANNUAL WELLNESS VISIT  03/07/2020  Telephone Visit Disclaimer This Medicare AWV was conducted by telephone due to national recommendations for restrictions regarding the COVID-19 Pandemic (e.g. social distancing).  I verified, using two identifiers, that I am speaking with [name] or their authorized healthcare agent. I discussed the limitations, risks, security, and privacy concerns of performing an evaluation and management service by telephone and the potential availability of an in-person appointment in the future. The patient expressed understanding and agreed to proceed.   Subjective:  [name] is a 64 y.o. male patient of Clifford Norlander, DO who had a Medicare Annual Wellness Visit today via telephone. [name] is Retired and lives with their spouse. he has three children.One son lives with him that is diagnosed with Cirrhosis. he reports that he is socially active and does interact with friends/family regularly. he is minimally physically active.  Patient Care Team: Clifford Norlander, DO as PCP - General (Family Medicine) Clifford Kenner, MD as Consulting Physician (Dermatology) Clifford Gerold, MD as Consulting Physician (Nephrology)  Advanced Directives 03/07/2020 08/28/2018 06/28/2018 11/30/2017 04/14/2017  Does Patient Have a Medical Advance Directive? No No No No No  Would patient like information on creating a medical advance directive? No - Patient declined - No - Patient declined - Yes (MAU/Ambulatory/Procedural Areas - Information given)    Hospital Utilization Over the Past 12 Months: # of hospitalizations or ER visits: 0 # of surgeries: 0  Review of Systems    Patient reports that his overall health is unchanged compared to last year.  Negative except Patient complains of right shoulder pain today. This can cause his right arm to become numb. He has trouble sleeping due to pain. Pt takes Oxycotin 10-325,g. One tab prn. He takes  this sporadically. The medication was prescribed by a Dr. Rolena Infante when he had his back surgery.    Patient reports that his blood sugars are staying in the 200-220 range. He admits to eating late at night (10-10:30pm) He will check his sugar around that time.  Patient Reported Readings (BP-160/80, blood sugar last night at 10:30 was 220. Patient   Pain Assessment Pain : 0-10 Pain Score: 8  Pain Type: Chronic pain Pain Location: Shoulder Pain Orientation: Right Pain Radiating Towards: Right arm/numbness Pain Descriptors / Indicators: Aching, Dull Pain Onset: More than a month ago Pain Frequency: Intermittent Pain Relieving Factors: Takes Oxycotin 10-325mg  1 tab prn. Given by Orthopedic surgeon for back surgery  Pain Relieving Factors: Takes Oxycotin 10-325mg  1 tab prn. Given by Orthopedic surgeon for back surgery  Current Medications & Allergies (verified) Allergies as of 03/07/2020   No Known Allergies     Medication List       Accurate as of March 07, 2020 11:03 AM. If you have any questions, ask your nurse or doctor.        STOP taking these medications   finasteride 5 MG tablet Commonly known as: PROSCAR   gabapentin 100 MG capsule Commonly known as: NEURONTIN   methocarbamol 500 MG tablet Commonly known as: Robaxin   spironolactone 25 MG tablet Commonly known as: Aldactone   tamsulosin 0.4 MG Caps capsule Commonly known as: FLOMAX     TAKE these medications   amLODipine 10 MG tablet Commonly known as: NORVASC Take 1 tablet (10 mg total) by mouth daily.   cloNIDine 0.3 MG tablet Commonly known as: CATAPRES Take 1 tablet (0.3 mg total) by mouth 2 (two) times daily.  Insulin Pen Needle 32G X 4 MM Misc Use with insulin daily Dx E10.22   irbesartan 300 MG tablet Commonly known as: AVAPRO irbesartan 300 mg tablet   metFORMIN 500 MG 24 hr tablet Commonly known as: GLUCOPHAGE-XR Take 2 tablets (1,000 mg total) by mouth 2 (two) times daily. TAKE  (1)   TABLET TWICE A DAY. What changed: additional instructions   Tresiba FlexTouch 100 UNIT/ML FlexTouch Pen Generic drug: insulin degludec Inject 0.26-0.6 mLs (26-60 Units total) into the skin daily. Increase by one unit daily until fasting sugar is 150       History (reviewed): Past Medical History:  Diagnosis Date  . Chronic kidney disease   . Complication of anesthesia    Hard to wake  . Degenerative joint disease (DJD) of lumbar spine   . Diabetes mellitus without complication ([place])    Type II  . Hypertension   . Lumbar stenosis   . Proteinuria   . Radicular leg pain    Past Surgical History:  Procedure Laterality Date  . BELPHAROPTOSIS REPAIR    . CARDIAC CATHETERIZATION    . EYE SURGERY    . HEMORRHOID SURGERY    . TRANSFORAMINAL LUMBAR INTERBODY FUSION (TLIF) WITH PEDICLE SCREW FIXATION 2 LEVEL N/A 07/06/2018   Procedure: TRANSFORAMINAL LUMBAR INTERBODY FUSION (TLIF) L4-S1;  Surgeon: Melina Schools, MD;  Location: [facility];  Service: Orthopedics;  Laterality: N/A;   Family History  Problem Relation Age of Onset  . Heart attack Father   . Hypertension Father   . Vision loss Sister        one eye  . Cancer Brother        skin  . Diabetes Brother    Social History   Socioeconomic History  . Marital status: Married    Spouse name: Not on file  . Number of children: Not on file  . Years of education: Not on file  . Highest education level: Not on file  Occupational History  . Not on file  Tobacco Use  . Smoking status: Never Smoker  . Smokeless tobacco: Never Used  Substance and Sexual Activity  . Alcohol use: Yes    Alcohol/week: 8.0 standard drinks    Types: 8 Cans of beer per week  . Drug use: No  . Sexual activity: Yes  Other Topics Concern  . Not on file  Social History Narrative  . Not on file   Social Determinants of Health   Financial Resource Strain:   . Difficulty of Paying Living Expenses:   Food Insecurity:   . Worried About Sales executive in the Last Year:   . Arboriculturist in the Last Year:   Transportation Needs:   . Film/video editor (Medical):   Marland Kitchen Lack of Transportation (Non-Medical):   Physical Activity:   . Days of Exercise per Week:   . Minutes of Exercise per Session:   Stress:   . Feeling of Stress :   Social Connections:   . Frequency of Communication with Friends and Family:   . Frequency of Social Gatherings with Friends and Family:   . Attends Religious Services:   . Active Member of Clubs or Organizations:   . Attends Archivist Meetings:   Marland Kitchen Marital Status:     Activities of Daily Living In your present state of health, do you have any difficulty performing the following activities: 03/07/2020  Hearing? N  Vision? N  Difficulty concentrating or making  decisions? Y  Walking or climbing stairs? Y  Comment Diabetic Neuropathy- pain in hands and feet  Dressing or bathing? Y  Comment Has difficulty with button up shirts  Doing errands, shopping? N  Preparing Food and eating ? N  Using the Toilet? N  In the past six months, have you accidently leaked urine? Y  Do you have problems with loss of bowel control? N  Managing your Medications? Y  Comment Wife helps patient  Managing your Finances? N  Housekeeping or managing your Housekeeping? N  Some recent data might be hidden    Patient Education/ Literacy How often do you need to have someone help you when you read instructions, pamphlets, or other written materials from your doctor or pharmacy?: 2 - Rarely What is the last grade level you completed in school?: 8th grade  Exercise Current Exercise Habits: The patient does not participate in regular exercise at present, Exercise limited by: orthopedic condition(s);neurologic condition(s)  Diet Patient reports consuming 3 meals a day and 2 snack(s) a day Patient reports that his primary diet is: Regular Patient reports that she does have regular access to food.   Depression  Screen PHQ 2/9 Scores 03/07/2020 12/06/2019 10/01/2019 03/06/2019 12/01/2018 11/10/2018 09/01/2018  PHQ - 2 Score 0 0 0 0 0 3 0  PHQ- 9 Score - - - - - 12 -     Fall Risk Fall Risk  03/07/2020 12/06/2019 10/01/2019 03/06/2019 12/01/2018  Falls in the past year? 0 0 0 0 1  Number falls in past yr: - 0 - - 0  Injury with Fall? - 0 - - 1  Comment - - - - -  Risk for fall due to : - Impaired balance/gait - - -     Objective:  [name] seemed alert and oriented and he participated appropriately during our telephone visit.  Blood Pressure Weight BMI  BP Readings from Last 3 Encounters:  03/07/20 (!) 160/80  12/06/19 133/75  10/15/19 125/72   Wt Readings from Last 3 Encounters:  12/06/19 215 lb 3.2 oz (97.6 kg)  10/01/19 210 lb 6.4 oz (95.4 kg)  03/06/19 207 lb (93.9 kg)   BMI Readings from Last 1 Encounters:  12/06/19 30.01 kg/m    *Unable to obtain current vital signs, weight, and BMI due to telephone visit type  Hearing/Vision  . Amahd did not seem to have difficulty with hearing/understanding during the telephone conversation . Reports that he has not had a formal eye exam by an eye care professional within the past year . Reports that he has not had a formal hearing evaluation within the past year *Unable to fully assess hearing and vision during telephone visit type  Cognitive Function: 6CIT Screen 03/07/2020  What Year? 0 points  What month? 0 points  What time? 0 points  Count back from 20 0 points  Months in reverse 2 points  Repeat phrase 4 points   (Normal:0-7, Significant for Dysfunction: >8)  Normal Cognitive Function Screening: Yes   Immunization & Health Maintenance Record Immunization History  Administered Date(s) Administered  . Influenza,inj,Quad PF,6+ Mos 08/25/2017, 09/01/2018  . Influenza,inj,quad, With Preservative 09/15/2017, 09/11/2018  . Pneumococcal Polysaccharide-23 04/14/2017    Health Maintenance  Topic Date Due  . Hepatitis C  Screening  Never done  . FOOT EXAM  Never done  . COVID-19 Vaccine (1) Never done  . TETANUS/TDAP  Never done  . COLONOSCOPY  Never done  . OPHTHALMOLOGY EXAM  07/24/2016  .  PNA vac Low Risk Adult (1 of 2 - PCV13) 01/11/2019  . HEMOGLOBIN A1C  03/30/2020  . INFLUENZA VACCINE  06/15/2020       Assessment  This is a routine wellness examination for [facility].  Health Maintenance: Due or Overdue Health Maintenance Due  Topic Date Due  . Hepatitis C Screening  Never done  . FOOT EXAM  Never done  . COVID-19 Vaccine (1) Never done  . TETANUS/TDAP  Never done  . COLONOSCOPY  Never done  . OPHTHALMOLOGY EXAM  07/24/2016  . PNA vac Low Risk Adult (1 of 2 - PCV13) 01/11/2019    [name] does not need a referral for Community Assistance: Care Management:   no Social Work:    no Prescription Assistance:  no Nutrition/Diabetes Education:  no   Plan:  Personalized Goals Goals Addressed            This Visit's Progress   . Client will verbalize knowledge of diabetes self-management as evidenced by Hgb A1C <7 or as defined by provider.       Diabetes self management actions:  Glucose monitoring per provider recommendations  Perform Quality checks on blood meter  Eat Healthy  Check feet daily  Visit provider every 3-6 months as directed  Hbg A1C level every 3-6 months.  Eye Exam yearly    . Exercise 150 min/wk Moderate Activity        Personalized Health Maintenance & Screening Recommendations   Lung Cancer Screening Recommended: no (Low Dose CT Chest recommended if Age 40-80 years, 30 pack-year currently smoking OR have quit w/in past 15 years) Hepatitis C Screening recommended: no HIV Screening recommended: no  Advanced Directives: Written information was not prepared per patient's request.  Referrals & Orders No orders of the defined types were placed in this encounter.   Follow-up Plan . Follow-up with Clifford Norlander, DO as  planned . Schedule 03/27/2020   I have personally reviewed and noted the following in the patient's chart:   . Medical and social history . Use of alcohol, tobacco or illicit drugs  . Current medications and supplements . Functional ability and status . Nutritional status . Physical activity . Advanced directives . List of other physicians . Hospitalizations, surgeries, and ER visits in previous 12 months . Vitals . Screenings to include cognitive, depression, and falls . Referrals and appointments  In addition, I have reviewed and discussed with [name] certain preventive protocols, quality metrics, and best practice recommendations. A written personalized care plan for preventive services as well as general preventive health recommendations is available and can be mailed to the patient at his request.      Maud Deed [facility]  06/25/314

## 2020-03-07 NOTE — Patient Instructions (Signed)
  [place] Maintenance Summary and Written Plan of Care  [name] ,  Thank you for allowing me to perform your Medicare Annual Wellness Visit and for your ongoing commitment to your health.   Health Maintenance & Immunization History Health Maintenance  Topic Date Due  . Hepatitis C Screening  Never done  . FOOT EXAM  Never done  . COVID-19 Vaccine (1) Never done  . TETANUS/TDAP  Never done  . COLONOSCOPY  Never done  . OPHTHALMOLOGY EXAM  07/24/2016  . PNA vac Low Risk Adult (1 of 2 - PCV13) 01/11/2019  . HEMOGLOBIN A1C  03/30/2020  . INFLUENZA VACCINE  06/15/2020   Immunization History  Administered Date(s) Administered  . Influenza,inj,Quad PF,6+ Mos 08/25/2017, 09/01/2018  . Influenza,inj,quad, With Preservative 09/15/2017, 09/11/2018  . Pneumococcal Polysaccharide-23 04/14/2017    These are the patient goals that we discussed: Goals Addressed            This Visit's Progress   . Client will verbalize knowledge of diabetes self-management as evidenced by Hgb A1C <7 or as defined by provider.       Diabetes self management actions:  Glucose monitoring per provider recommendations  Perform Quality checks on blood meter  Eat Healthy  Check feet daily  Visit provider every 3-6 months as directed  Hbg A1C level every 3-6 months.  Eye Exam yearly    . Exercise 150 min/wk Moderate Activity          This is a list of Health Maintenance Items that are overdue or due now: Health Maintenance Due  Topic Date Due  . Hepatitis C Screening  Never done  . FOOT EXAM  Never done  . COVID-19 Vaccine (1) Never done  . TETANUS/TDAP  Never done  . COLONOSCOPY  Never done  . OPHTHALMOLOGY EXAM  07/24/2016  . PNA vac Low Risk Adult (1 of 2 - PCV13) 01/11/2019     Orders/Referrals Placed Today: No orders of the defined types were placed in this encounter.  (Contact our referral department at [phone] if you have not spoken with  someone about your referral appointment within the next 5 days)    Follow-up Plan  Scheduled with Clifford Doss, DO on 03/27/2020

## 2020-04-01 ENCOUNTER — Other Ambulatory Visit: Payer: Self-pay

## 2020-04-01 ENCOUNTER — Ambulatory Visit (INDEPENDENT_AMBULATORY_CARE_PROVIDER_SITE_OTHER): Payer: PPO

## 2020-04-01 ENCOUNTER — Ambulatory Visit (INDEPENDENT_AMBULATORY_CARE_PROVIDER_SITE_OTHER): Payer: PPO | Admitting: Family Medicine

## 2020-04-01 ENCOUNTER — Encounter: Payer: Self-pay | Admitting: Family Medicine

## 2020-04-01 VITALS — BP 107/61 | HR 80 | Temp 97.8°F | Ht 71.0 in | Wt 208.0 lb

## 2020-04-01 DIAGNOSIS — L97512 Non-pressure chronic ulcer of other part of right foot with fat layer exposed: Secondary | ICD-10-CM

## 2020-04-01 DIAGNOSIS — E1169 Type 2 diabetes mellitus with other specified complication: Secondary | ICD-10-CM | POA: Diagnosis not present

## 2020-04-01 DIAGNOSIS — Z9989 Dependence on other enabling machines and devices: Secondary | ICD-10-CM | POA: Diagnosis not present

## 2020-04-01 DIAGNOSIS — E785 Hyperlipidemia, unspecified: Secondary | ICD-10-CM | POA: Diagnosis not present

## 2020-04-01 DIAGNOSIS — E11621 Type 2 diabetes mellitus with foot ulcer: Secondary | ICD-10-CM | POA: Diagnosis not present

## 2020-04-01 DIAGNOSIS — I10 Essential (primary) hypertension: Secondary | ICD-10-CM | POA: Diagnosis not present

## 2020-04-01 DIAGNOSIS — E1122 Type 2 diabetes mellitus with diabetic chronic kidney disease: Secondary | ICD-10-CM

## 2020-04-01 DIAGNOSIS — G4733 Obstructive sleep apnea (adult) (pediatric): Secondary | ICD-10-CM

## 2020-04-01 DIAGNOSIS — E1159 Type 2 diabetes mellitus with other circulatory complications: Secondary | ICD-10-CM | POA: Diagnosis not present

## 2020-04-01 DIAGNOSIS — N182 Chronic kidney disease, stage 2 (mild): Secondary | ICD-10-CM | POA: Diagnosis not present

## 2020-04-01 DIAGNOSIS — I152 Hypertension secondary to endocrine disorders: Secondary | ICD-10-CM

## 2020-04-01 DIAGNOSIS — Z1159 Encounter for screening for other viral diseases: Secondary | ICD-10-CM | POA: Diagnosis not present

## 2020-04-01 LAB — BAYER DCA HB A1C WAIVED: HB A1C (BAYER DCA - WAIVED): 7.8 % — ABNORMAL HIGH (ref <7.0)

## 2020-04-01 NOTE — Progress Notes (Signed)
Subjective: CC: DM2 PCP: [name], DO GYI:RSWNIOE [name] is a 64 y.o. male presenting to clinic today for:  1. Type 2 Diabetes w/ HTN, HLD, foot lesion:  Patient reports that he "adjusts insulin" pending his night time BGs.  He was unaware that he was supposed to take a consistent amount.  He notes he does not monitor fasting BGs.  He is compliant with metformin 1.5 tabs BID.  Compliant with Avapro, norvasc.  Uses gabapentin for neuropathy.  He currently has a lesion on his right toe that needs to be looked at.  Lesion has been present for several months but began hurting in the last several weeks.  It started bleeding during today's appointment.  He admits to numbness and tingling of the feet bilaterally.  Last eye exam: due Last foot exam: due Last A1c:  Lab Results  Component Value Date   HGBA1C 7.7 (H) 10/01/2019   Nephropathy screen indicated?: on ACE-I Last flu, zoster and/or pneumovax:  Immunization History  Administered Date(s) Administered  . Influenza,inj,Quad PF,6+ Mos 08/25/2017, 09/01/2018  . Influenza,inj,quad, With Preservative 09/15/2017, 09/11/2018  . Pneumococcal Polysaccharide-23 04/14/2017     ROS: Per HPI  No Known Allergies Past Medical History:  Diagnosis Date  . Chronic kidney disease   . Complication of anesthesia    Hard to wake  . Degenerative joint disease (DJD) of lumbar spine   . Diabetes mellitus without complication ([place])    Type II  . Essential hypertension 02/23/2017  . Hypertension   . Lumbar stenosis   . Proteinuria   . Radicular leg pain     Current Outpatient Medications:  .  amLODipine (NORVASC) 10 MG tablet, Take 1 tablet (10 mg total) by mouth daily., Disp: 90 tablet, Rfl: 3 .  cloNIDine (CATAPRES) 0.3 MG tablet, Take 1 tablet (0.3 mg total) by mouth 2 (two) times daily., Disp: 180 tablet, Rfl: 3 .  insulin degludec (TRESIBA FLEXTOUCH) 100 UNIT/ML SOPN FlexTouch Pen, Inject 0.26-0.6 mLs (26-60 Units total) into  the skin daily. Increase by one unit daily until fasting sugar is 150, Disp: 30 mL, Rfl: 5 .  Insulin Pen Needle 32G X 4 MM MISC, Use with insulin daily Dx E10.22, Disp: 100 each, Rfl: 3 .  irbesartan (AVAPRO) 300 MG tablet, irbesartan 300 mg tablet, Disp: 90 tablet, Rfl: 3 .  metFORMIN (GLUCOPHAGE-XR) 500 MG 24 hr tablet, Take 2 tablets (1,000 mg total) by mouth 2 (two) times daily. TAKE  (1)  TABLET TWICE A DAY. (Patient taking differently: Take 1,000 mg by mouth 2 (two) times daily. Takes 1.5 tabs bid), Disp: 360 tablet, Rfl: 1 Social History   Socioeconomic History  . Marital status: Married    Spouse name: Not on file  . Number of children: Not on file  . Years of education: Not on file  . Highest education level: Not on file  Occupational History  . Not on file  Tobacco Use  . Smoking status: Never Smoker  . Smokeless tobacco: Never Used  Substance and Sexual Activity  . Alcohol use: Yes    Alcohol/week: 8.0 standard drinks    Types: 8 Cans of beer per week  . Drug use: No  . Sexual activity: Yes  Other Topics Concern  . Not on file  Social History Narrative  . Not on file   Social Determinants of Health   Financial Resource Strain:   . Difficulty of Paying Living Expenses:   Food Insecurity:   .  Worried About Charity fundraiser in the Last Year:   . Arboriculturist in the Last Year:   Transportation Needs:   . Film/video editor (Medical):   Marland Kitchen Lack of Transportation (Non-Medical):   Physical Activity:   . Days of Exercise per Week:   . Minutes of Exercise per Session:   Stress:   . Feeling of Stress :   Social Connections:   . Frequency of Communication with Friends and Family:   . Frequency of Social Gatherings with Friends and Family:   . Attends Religious Services:   . Active Member of Clubs or Organizations:   . Attends Archivist Meetings:   Marland Kitchen Marital Status:   Intimate Partner Violence:   . Fear of Current or Ex-Partner:   . Emotionally  Abused:   Marland Kitchen Physically Abused:   . Sexually Abused:    Family History  Problem Relation Age of Onset  . Heart attack Father   . Hypertension Father   . Vision loss Sister        one eye  . Cancer Brother        skin  . Diabetes Brother     Objective: Office vital signs reviewed. BP 107/61   Pulse 80   Temp 97.8 F (36.6 C) (Temporal)   Ht 5\' 11"  (1.803 m)   Wt 208 lb (94.3 kg)   SpO2 100%   BMI 29.01 kg/m   Physical Examination:  General: Awake, alert, nontoxic appearing, No acute distress HEENT: Normal, sclera white, MMM Cardio: regular rate and rhythm, S1S2 heard, no murmurs appreciated Pulm: clear to auscultation bilaterally, no wheezes, rhonchi or rales; normal work of breathing on room air Extremities: warm, well perfused, No edema, cyanosis or clubbing; +1 pulses bilaterally Skin: see DM foot Neuro: see DM foot Diabetic Foot Exam - Simple   Simple Foot Form Diabetic Foot exam was performed with the following findings: Yes 04/01/2020 11:05 AM  Visual Inspection See comments: Yes Sensation Testing See comments: Yes Pulse Check See comments: Yes Comments +1 pedal pulses.  Right plantar aspect of great toe with hyperpigmentation.  No active bleeding but blood present on paper.  There is skin breakdown and what appears to be a crack in the skin of the to extended about 0.5 cm.  No redness/ warmth. Absent monofilament sensation.    DG Toe Great Right  Result Date: 04/01/2020 CLINICAL DATA:  Right great toe diabetic ulcer. EXAM: RIGHT GREAT TOE COMPARISON:  None. FINDINGS: Diffuse soft tissue swelling involving distal half of the great toe with overlying bandage material. Mild soft tissue irregularity involving the distal aspect of the toes slightly medially compatible with the known ulcer. No soft tissue gas, bone destruction or periosteal reaction. Atheromatous arterial calcifications. IMPRESSION: Diffuse soft tissue swelling without radiographic evidence of  underlying osteomyelitis. Electronically Signed   By: Claudie Revering M.D.   On: 04/01/2020 11:39   Assessment/ Plan: 64 y.o. male   1. Type 2 diabetes mellitus with stage 2 chronic kidney disease, without long-term current use of insulin (HCC) Sugars not well controlled.  We discussed that he should be monitoring fasting blood sugars each morning so that we can titrate his [place] appropriately.  A new meter was provided to him today.  He is to continue Metformin 1.5 tablets twice daily for now.  I would like to reconvene in about 2 weeks for blood sugar review as well as to recheck his toe.  He appears to  have a diabetic ulcer.  A referral to podiatry has been placed as well.  At this time, no evidence of secondary infection. - Bayer DCA Hb A1c Waived  2. Hyperlipidemia associated with type 2 diabetes mellitus ([place]) Uncertain as to why he is not on a statin.  Need to discuss this further at next visit  3. Hypertension associated with diabetes ([place]) Controlled  4. OSA on CPAP  5. Encounter for hepatitis C screening test for low risk patient - Hepatitis C antibody  6. Diabetic ulcer of toe of right foot associated with type 2 diabetes mellitus, with fat layer exposed ([place]) No evidence of secondary infection.  We discussed wound care.  I am going to send him to podiatry as this likely needs to be debrided given the hyperpigmentation noted.  I question possible necrosis.  X-rays were obtained to look for any osteomyelitis changes.  There were none. - DG Toe Great Right; Future - Ambulatory referral to Podiatry   No orders of the defined types were placed in this encounter.  No orders of the defined types were placed in this encounter.    [name], DO [place] [phone]

## 2020-04-01 NOTE — Patient Instructions (Signed)
Check blood sugar each morning and record.    I will call you with xray results  You have been referred to a foot specialist   Diabetes Mellitus and [place] care is an important part of your health, especially when you have diabetes. Diabetes may cause you to have problems because of poor blood flow (circulation) to your feet and legs, which can cause your skin to:  Become thinner and drier.  Break more easily.  Heal more slowly.  Peel and crack. You may also have nerve damage (neuropathy) in your legs and feet, causing decreased feeling in them. This means that you may not notice minor injuries to your feet that could lead to more serious problems. Noticing and addressing any potential problems early is the best way to prevent future foot problems. How to care for your feet Foot hygiene  Wash your feet daily with warm water and mild soap. Do not use hot water. Then, pat your feet and the areas between your toes until they are completely dry. Do not soak your feet as this can dry your skin.  Trim your toenails straight across. Do not dig under them or around the cuticle. File the edges of your nails with an emery board or nail file.  Apply a moisturizing lotion or petroleum jelly to the skin on your feet and to dry, brittle toenails. Use lotion that does not contain alcohol and is unscented. Do not apply lotion between your toes. Shoes and socks  Wear clean socks or stockings every day. Make sure they are not too tight. Do not wear knee-high stockings since they may decrease blood flow to your legs.  Wear shoes that fit properly and have enough cushioning. Always look in your shoes before you put them on to be sure there are no objects inside.  To break in new shoes, wear them for just a few hours a day. This prevents injuries on your feet. Wounds, scrapes, corns, and calluses  Check your feet daily for blisters, cuts, bruises, sores, and redness. If you cannot see the  bottom of your feet, use a mirror or ask someone for help.  Do not cut corns or calluses or try to remove them with medicine.  If you find a minor scrape, cut, or break in the skin on your feet, keep it and the skin around it clean and dry. You may clean these areas with mild soap and water. Do not clean the area with peroxide, alcohol, or iodine.  If you have a wound, scrape, corn, or callus on your foot, look at it several times a day to make sure it is healing and not infected. Check for: ? Redness, swelling, or pain. ? Fluid or blood. ? Warmth. ? Pus or a bad smell. General instructions  Do not cross your legs. This may decrease blood flow to your feet.  Do not use heating pads or hot water bottles on your feet. They may burn your skin. If you have lost feeling in your feet or legs, you may not know this is happening until it is too late.  Protect your feet from hot and cold by wearing shoes, such as at the beach or on hot pavement.  Schedule a complete foot exam at least once a year (annually) or more often if you have foot problems. If you have foot problems, report any cuts, sores, or bruises to your health care provider immediately. Contact a health care provider if:  You have  a medical condition that increases your risk of infection and you have any cuts, sores, or bruises on your feet.  You have an injury that is not healing.  You have redness on your legs or feet.  You feel burning or tingling in your legs or feet.  You have pain or cramps in your legs and feet.  Your legs or feet are numb.  Your feet always feel cold.  You have pain around a toenail. Get help right away if:  You have a wound, scrape, corn, or callus on your foot and: ? You have pain, swelling, or redness that gets worse. ? You have fluid or blood coming from the wound, scrape, corn, or callus. ? Your wound, scrape, corn, or callus feels warm to the touch. ? You have pus or a bad smell coming  from the wound, scrape, corn, or callus. ? You have a fever. ? You have a red line going up your leg. Summary  Check your feet every day for cuts, sores, red spots, swelling, and blisters.  Moisturize feet and legs daily.  Wear shoes that fit properly and have enough cushioning.  If you have foot problems, report any cuts, sores, or bruises to your health care provider immediately.  Schedule a complete foot exam at least once a year (annually) or more often if you have foot problems. This information is not intended to replace advice given to you by your health care provider. Make sure you discuss any questions you have with your health care provider. Document Revised: 07/25/2019 Document Reviewed: 12/03/2016 Elsevier Patient Education  [place].

## 2020-04-02 ENCOUNTER — Ambulatory Visit (INDEPENDENT_AMBULATORY_CARE_PROVIDER_SITE_OTHER): Payer: PPO

## 2020-04-02 ENCOUNTER — Ambulatory Visit: Payer: PPO | Admitting: Podiatry

## 2020-04-02 ENCOUNTER — Emergency Department (HOSPITAL_COMMUNITY)
Admission: EM | Admit: 2020-04-02 | Discharge: 2020-04-02 | Disposition: A | Discharge status: Home / Self Care | Payer: PPO | Attending: Emergency Medicine | Admitting: Emergency Medicine

## 2020-04-02 ENCOUNTER — Telehealth: Payer: Self-pay | Admitting: Podiatry

## 2020-04-02 VITALS — BP 180/84 | HR 71 | Temp 98.2°F

## 2020-04-02 DIAGNOSIS — I739 Peripheral vascular disease, unspecified: Secondary | ICD-10-CM

## 2020-04-02 DIAGNOSIS — E11621 Type 2 diabetes mellitus with foot ulcer: Secondary | ICD-10-CM | POA: Diagnosis not present

## 2020-04-02 DIAGNOSIS — E13621 Other specified diabetes mellitus with foot ulcer: Secondary | ICD-10-CM

## 2020-04-02 DIAGNOSIS — L97511 Non-pressure chronic ulcer of other part of right foot limited to breakdown of skin: Secondary | ICD-10-CM

## 2020-04-02 DIAGNOSIS — M79671 Pain in right foot: Secondary | ICD-10-CM

## 2020-04-02 DIAGNOSIS — Z79899 Other long term (current) drug therapy: Secondary | ICD-10-CM | POA: Diagnosis not present

## 2020-04-02 DIAGNOSIS — Z794 Long term (current) use of insulin: Secondary | ICD-10-CM | POA: Diagnosis not present

## 2020-04-02 DIAGNOSIS — L03115 Cellulitis of right lower limb: Secondary | ICD-10-CM

## 2020-04-02 DIAGNOSIS — L97519 Non-pressure chronic ulcer of other part of right foot with unspecified severity: Secondary | ICD-10-CM | POA: Diagnosis not present

## 2020-04-02 DIAGNOSIS — I10 Essential (primary) hypertension: Secondary | ICD-10-CM | POA: Diagnosis not present

## 2020-04-02 LAB — COMPREHENSIVE METABOLIC PANEL
ALT: 14 U/L (ref 0–44)
AST: 18 U/L (ref 15–41)
Albumin: 3.8 g/dL (ref 3.5–5.0)
Alkaline Phosphatase: 76 U/L (ref 38–126)
Anion gap: 14 (ref 5–15)
BUN: 21 mg/dL (ref 8–23)
CO2: 18 mmol/L — ABNORMAL LOW (ref 22–32)
Calcium: 9.6 mg/dL (ref 8.9–10.3)
Chloride: 101 mmol/L (ref 98–111)
Creatinine, Ser: 1.13 mg/dL (ref 0.61–1.24)
GFR calc Af Amer: >60 mL/min (ref >60)
GFR calc non Af Amer: >60 mL/min (ref >60)
Glucose, Bld: 198 mg/dL — ABNORMAL HIGH (ref 70–99)
Potassium: 4.8 mmol/L (ref 3.5–5.1)
Sodium: 133 mmol/L — ABNORMAL LOW (ref 135–145)
Total Bilirubin: 1.3 mg/dL — ABNORMAL HIGH (ref 0.3–1.2)
Total Protein: 7.2 g/dL (ref 6.5–8.1)

## 2020-04-02 LAB — CBC
HCT: 36.2 % — ABNORMAL LOW (ref 39.0–52.0)
Hemoglobin: 12.1 g/dL — ABNORMAL LOW (ref 13.0–17.0)
MCH: 31.4 pg (ref 26.0–34.0)
MCHC: 33.4 g/dL (ref 30.0–36.0)
MCV: 94 fL (ref 80.0–100.0)
Platelets: 216 10*3/uL (ref 150–400)
RBC: 3.85 MIL/uL — ABNORMAL LOW (ref 4.22–5.81)
RDW: 12.8 % (ref 11.5–15.5)
WBC: 12.1 10*3/uL — ABNORMAL HIGH (ref 4.0–10.5)
nRBC: 0 % (ref 0.0–0.2)

## 2020-04-02 LAB — HEPATITIS C ANTIBODY: Hep C Virus Ab: <0.1 s/co ratio (ref 0.0–0.9)

## 2020-04-02 LAB — LACTIC ACID, PLASMA: Lactic Acid, Venous: 1.8 mmol/L (ref 0.5–1.9)

## 2020-04-02 MED ORDER — LIDOCAINE HCL (PF) 1 % IJ SOLN
INTRAMUSCULAR | Status: AC
  Administered 2020-04-02: 0.9 mL
  Filled 2020-04-02: qty 5

## 2020-04-02 MED ORDER — CLINDAMYCIN HCL 150 MG PO CAPS: 300.0000 mg | ORAL_CAPSULE | Freq: Three times a day (TID) | ORAL | 0 refills | Status: AC

## 2020-04-02 MED ORDER — CEFTRIAXONE SODIUM 1 G IJ SOLR
1.0000 g | Freq: Once | INTRAMUSCULAR | Status: AC
  Administered 2020-04-02: 1 g via INTRAMUSCULAR
  Filled 2020-04-02: qty 10

## 2020-04-02 NOTE — ED Provider Notes (Signed)
[facility] EMERGENCY DEPARTMENT Provider Note   CSN: [id number] Arrival date & time: 04/02/20  0932     History No chief complaint on file.   [name] is a 64 y.o. male.  HPI  Patient is a type II diabetic on insulin who is followed by a podiatrist monthly basis.  He states that he has had a right toe ulcer for just over 1 month.  He says it has been progressively worsening over the past week he states that it seems like it is enlarged.  He was seen by podiatrist and sent to the emergency department for further evaluation and concern for infection.  Patient denies any fevers, chills, nausea, vomiting, lightheadedness, dizziness, denies any pain in the toe as he has severe neuropathy.  Patient states that his blood sugars typically run in the low 200s.  He states that he is working on improving his blood sugar.  Patient states apart from neuropathy in his hands and feet he has no significant complications of his diabetes at this time.  States no chronic kidney disease although it is noted in his past medical history.    Past Medical History:  Diagnosis Date  . Chronic kidney disease   . Complication of anesthesia    Hard to wake  . Degenerative joint disease (DJD) of lumbar spine   . Diabetes mellitus without complication ([place])    Type II  . Essential hypertension 02/23/2017  . Hypertension   . Lumbar stenosis   . Proteinuria   . Radicular leg pain     Patient Active Problem List   Diagnosis Date Noted  . OSA on CPAP 09/01/2018  . S/P lumbar fusion 07/06/2018  . DDD (degenerative disc disease), lumbar 02/28/2018  . Hypertension associated with diabetes ([place]) 02/23/2017  . Benign prostatic hyperplasia with incomplete bladder emptying 02/23/2017  . Vitamin D deficiency 02/23/2017  . Hyperlipidemia associated with type 2 diabetes mellitus ([place]) 02/23/2017  . Type 2 diabetes mellitus with stage 2 chronic kidney disease, without long-term current  use of insulin ([place]) 02/23/2017    Past Surgical History:  Procedure Laterality Date  . BELPHAROPTOSIS REPAIR    . CARDIAC CATHETERIZATION    . EYE SURGERY    . HEMORRHOID SURGERY    . TRANSFORAMINAL LUMBAR INTERBODY FUSION (TLIF) WITH PEDICLE SCREW FIXATION 2 LEVEL N/A 07/06/2018   Procedure: TRANSFORAMINAL LUMBAR INTERBODY FUSION (TLIF) L4-S1;  Surgeon: Melina Schools, MD;  Location: [facility];  Service: Orthopedics;  Laterality: N/A;       Family History  Problem Relation Age of Onset  . Heart attack Father   . Hypertension Father   . Vision loss Sister        one eye  . Cancer Brother        skin  . Diabetes Brother     Social History   Tobacco Use  . Smoking status: Never Smoker  . Smokeless tobacco: Never Used  Substance Use Topics  . Alcohol use: Yes    Alcohol/week: 6.0 standard drinks    Types: 6 Cans of beer per week  . Drug use: No    Home Medications Prior to Admission medications   Medication Sig Start Date End Date Taking? Authorizing Provider  amLODipine (NORVASC) 10 MG tablet Take 1 tablet (10 mg total) by mouth daily. 10/01/19  Yes Terald Sleeper, PA-C  cholecalciferol (VITAMIN D3) 25 MCG (1000 UNIT) tablet Take 2,000 Units by mouth daily.   Yes [provider]  cloNIDine (CATAPRES) 0.3 MG tablet Take 1 tablet (0.3 mg total) by mouth 2 (two) times daily. 10/01/19  Yes Terald Sleeper, PA-C  finasteride (PROSCAR) 5 MG tablet Take 5 mg by mouth daily.   Yes [provider]  gabapentin (NEURONTIN) 100 MG capsule Take 100 mg by mouth 2 (two) times daily.   Yes [provider]  insulin degludec (TRESIBA FLEXTOUCH) 100 UNIT/ML SOPN FlexTouch Pen Inject 0.26-0.6 mLs (26-60 Units total) into the skin daily. Increase by one unit daily until fasting sugar is 150 Patient taking differently: Inject 20 Units into the skin daily. Increase by one unit daily until fasting sugar is 150 10/01/19  Yes Terald Sleeper, PA-C  irbesartan (AVAPRO) 300 MG  tablet irbesartan 300 mg tablet Patient taking differently: Take 300 mg by mouth daily.  10/01/19  Yes Terald Sleeper, PA-C  metFORMIN (GLUCOPHAGE-XR) 500 MG 24 hr tablet Take 2 tablets (1,000 mg total) by mouth 2 (two) times daily. TAKE  (1)  TABLET TWICE A DAY. Patient taking differently: Take 750 mg by mouth 2 (two) times daily.  01/01/20  Yes Terald Sleeper, PA-C  spironolactone (ALDACTONE) 25 MG tablet Take 25 mg by mouth daily. 03/26/20  Yes [provider]  tamsulosin (FLOMAX) 0.4 MG CAPS capsule Take 0.4 mg by mouth daily. 03/26/20  Yes [provider]  clindamycin (CLEOCIN) 150 MG capsule Take 2 capsules (300 mg total) by mouth 3 (three) times daily for 14 days. 04/02/20 04/16/20  Tedd Sias, PA  Insulin Pen Needle 32G X 4 MM MISC Use with insulin daily Dx E10.22 09/26/19   Terald Sleeper, PA-C    Allergies    Patient has no known allergies.  Review of Systems   Review of Systems  Constitutional: Negative for fever.  HENT: Negative for congestion.   Respiratory: Negative for shortness of breath.   Cardiovascular: Negative for chest pain.  Gastrointestinal: Negative for abdominal distention.  Musculoskeletal:       Right great toe pain  Neurological: Negative for dizziness and headaches.    Physical Exam Updated Vital Signs BP (!) 195/80 (BP Location: Left Arm)   Pulse 95   Temp 98.4 F (36.9 C) (Oral)   Resp 17   SpO2 99% Comment: pt ambulated from waiting room to bed  Physical Exam Vitals and nursing note reviewed.  Constitutional:      General: He is not in acute distress.    Appearance: Normal appearance. He is not ill-appearing.  HENT:     Head: Normocephalic and atraumatic.     Mouth/Throat:     Mouth: Mucous membranes are moist.  Eyes:     General: No scleral icterus.       Right eye: No discharge.        Left eye: No discharge.     Conjunctiva/sclera: Conjunctivae normal.  Cardiovascular:     Comments: DP and PT pulses 3+ and  symmetric Pulmonary:     Effort: Pulmonary effort is normal.     Breath sounds: No stridor.  Musculoskeletal:     Comments: F ROM of all toes.  Able to flex and extend great toe against resistance.  Skin:    General: Skin is warm and dry.     Capillary Refill: Capillary refill takes less than 2 seconds.     Comments: Approximately 1.5 cm circular ulceration of the medial side of the right great toe at the distal end.  It is mildly superficial  with good granulation tissue.  Good cap refill of surrounding skin.  There is no sensation however this is baseline for patient--given his neuropathy.  Patient is able to wiggle toe  Neurological:     Mental Status: He is alert and oriented to person, place, and time. Mental status is at baseline.  Psychiatric:        Mood and Affect: Mood normal.        Behavior: Behavior normal.         ED Results / Procedures / Treatments   Labs (all labs ordered are listed, but only abnormal results are displayed) Labs Reviewed  CBC - Abnormal; Notable for the following components:      Result Value   WBC 12.1 (*)    RBC 3.85 (*)    Hemoglobin 12.1 (*)    HCT 36.2 (*)    All other components within normal limits  COMPREHENSIVE METABOLIC PANEL - Abnormal; Notable for the following components:   Sodium 133 (*)    CO2 18 (*)    Glucose, Bld 198 (*)    Total Bilirubin 1.3 (*)    All other components within normal limits  LACTIC ACID, PLASMA    EKG None  Radiology DG Foot Complete Right  Result Date: 04/02/2020 Please see detailed radiograph report in office note.  DG Toe Great Right  Result Date: 04/01/2020 CLINICAL DATA:  Right great toe diabetic ulcer. EXAM: RIGHT GREAT TOE COMPARISON:  None. FINDINGS: Diffuse soft tissue swelling involving distal half of the great toe with overlying bandage material. Mild soft tissue irregularity involving the distal aspect of the toes slightly medially compatible with the known ulcer. No soft tissue gas,  bone destruction or periosteal reaction. Atheromatous arterial calcifications. IMPRESSION: Diffuse soft tissue swelling without radiographic evidence of underlying osteomyelitis. Electronically Signed   By: Claudie Revering M.D.   On: 04/01/2020 11:39    Procedures Procedures (including critical care time)  Medications Ordered in ED Medications  cefTRIAXone (ROCEPHIN) injection 1 g (has no administration in time range)    ED Course  I have reviewed the triage vital signs and the nursing notes.  Pertinent labs & imaging results that were available during my care of the patient were reviewed by me and considered in my medical decision making (see chart for details).  Patient is a 64 year old male with history of diabetes with ulcer for approximately 1 month presented today from podiatry with concerns for infected toe.  Lab work from below.  Physical exam concerning for diabetic foot ulcer.  No evidence of central infection.  No fluctuance or purulence.  Clinical Course as of Apr 02 1725  Wed Apr 02, 2020  1631 CBC with mild leukocytosis of 12.1. No significant anemia.  CBC(!) [WF]  1631 CMP with very mild hyponatremia.  Glucose mildly elevated.  Sodium corrected for hyperglycemia is actually 135.  Using [employer].  Comprehensive metabolic panel(!) [WF]  7062 Vital signs within normal is apart from mild hypertension.  Patient states that he has taken a blood pressure medications.  He states he can follow-up with his primary care doctor for this.   [WF]    Clinical Course User Index [WF] Tedd Sias, [place]   MDM Rules/Calculators/A&P                      5:26 PM discussed with Dr. Earleen Newport of podiatry who will see patient early next week.  Recommends clindamycin and discharged with close follow-up.  Patient is agreeable to plan.  I will provide patient with 1 IM dose of Rocephin and discharged with clindamycin for 14 days per guideline recommendations.  I discussed this case with  my attending physician who cosigned this note including patient's presenting symptoms, physical exam, and planned diagnostics and interventions. Attending physician stated agreement with plan or made changes to plan which were implemented.   Attending physician assessed patient at bedside.  Final Clinical Impression(s) / ED Diagnoses Final diagnoses:  Diabetic ulcer of toe of right foot associated with diabetes mellitus of other type, unspecified ulcer stage ([place])    Rx / DC Orders ED Discharge Orders         Ordered    clindamycin (CLEOCIN) 150 MG capsule  3 times daily     04/02/20 1723           Pati Gallo [place], [place] 04/02/20 1726    Margette Fast, MD 04/03/20 1624

## 2020-04-02 NOTE — Discharge Instructions (Addendum)
I prescribed you an antibiotic which I would like for you to take for the next 14 days.  Please follow-up closely with your podiatrist for reevaluation.  I recommend that you see your podiatrist within the next 5 days.  I would like for you to call today to establish an appointment.  If his wound worsens would like you to return immediately to the emergency department for reevaluation. I discussed your case with Dr. Earleen Newport who you saw this morning --he will see you Monday or Tuesday of this upcoming week.  Please take antibiotics as prescribed and follow-up with Dr. Earleen Newport.

## 2020-04-02 NOTE — Telephone Encounter (Signed)
A nurse from [facility] ED called wanting to speak to you about this patient. The PA's number is [phone].

## 2020-04-02 NOTE — ED Triage Notes (Signed)
Pt sent over by the foot MD for possible antibiotics for a foot infection , no fevers as of now , pt is a diabetic

## 2020-04-02 NOTE — Progress Notes (Signed)
Subjective:   Patient ID: [name], male   DOB: 64 y.o.   MRN: [id number]   HPI 64 year old male with past medical history significant for type 2 diabetes with stage II chronic kidney disease, neuropathy presents the office today for concerns of a wound on his right toe.  This is been ongoing for last several months has been hurting the last couple of weeks.  He has some bleeding coming from the wound. He has had some surrounding erythema but no red streaks. No current antibiotics.    Review of Systems  All other systems reviewed and are negative.  Past Medical History:  Diagnosis Date  . Chronic kidney disease   . Complication of anesthesia    Hard to wake  . Degenerative joint disease (DJD) of lumbar spine   . Diabetes mellitus without complication ([place])    Type II  . Essential hypertension 02/23/2017  . Hypertension   . Lumbar stenosis   . Proteinuria   . Radicular leg pain     Past Surgical History:  Procedure Laterality Date  . BELPHAROPTOSIS REPAIR    . CARDIAC CATHETERIZATION    . EYE SURGERY    . HEMORRHOID SURGERY    . TRANSFORAMINAL LUMBAR INTERBODY FUSION (TLIF) WITH PEDICLE SCREW FIXATION 2 LEVEL N/A 07/06/2018   Procedure: TRANSFORAMINAL LUMBAR INTERBODY FUSION (TLIF) L4-S1;  Surgeon: Melina Schools, MD;  Location: [facility];  Service: Orthopedics;  Laterality: N/A;     Current Outpatient Medications:  .  amLODipine (NORVASC) 10 MG tablet, Take 1 tablet (10 mg total) by mouth daily., Disp: 90 tablet, Rfl: 3 .  cloNIDine (CATAPRES) 0.3 MG tablet, Take 1 tablet (0.3 mg total) by mouth 2 (two) times daily., Disp: 180 tablet, Rfl: 3 .  finasteride (PROSCAR) 5 MG tablet, Take 5 mg by mouth daily., Disp: , Rfl:  .  gabapentin (NEURONTIN) 100 MG capsule, Take 100 mg by mouth 2 (two) times daily., Disp: , Rfl:  .  insulin degludec (TRESIBA FLEXTOUCH) 100 UNIT/ML SOPN FlexTouch Pen, Inject 0.26-0.6 mLs (26-60 Units total) into the skin daily. Increase by one unit  daily until fasting sugar is 150, Disp: 30 mL, Rfl: 5 .  Insulin Pen Needle 32G X 4 MM MISC, Use with insulin daily Dx E10.22, Disp: 100 each, Rfl: 3 .  irbesartan (AVAPRO) 300 MG tablet, irbesartan 300 mg tablet, Disp: 90 tablet, Rfl: 3 .  metFORMIN (GLUCOPHAGE-XR) 500 MG 24 hr tablet, Take 2 tablets (1,000 mg total) by mouth 2 (two) times daily. TAKE  (1)  TABLET TWICE A DAY. (Patient taking differently: Take 1,000 mg by mouth 2 (two) times daily. Takes 1.5 tabs bid), Disp: 360 tablet, Rfl: 1 .  spironolactone (ALDACTONE) 25 MG tablet, Take 25 mg by mouth daily., Disp: , Rfl:  .  tamsulosin (FLOMAX) 0.4 MG CAPS capsule, Take 0.4 mg by mouth daily., Disp: , Rfl:   No Known Allergies        Objective:  Physical Exam  General: AAO x3, NAD  Dermatological: Granular wound to the distal aspect of the right hallux with some fibrotic tissue. There is erythema and edema to the toe. There is erythema, swelling, warmth to the foot. There is no fluctuation or crepitation.   Vascular: Dorsalis Pedis artery and Posterior Tibial artery pedal pulses are palpable  bilateral with immedate capillary fill time.There is no pain with calf compression, swelling, warmth, erythema.   Neruologic: Sensation decreased  Musculoskeletal: No pain.   Gait: Unassisted, Nonantalgic.  Assessment:   Right hallux ulceration with cellulitis      Plan:  -Treatment options discussed including all alternatives, risks, and complications -Etiology of symptoms were discussed -X-rays were obtained and reviewed with the patient. No evidence of acute osteomyelitis or soft tissue emphysema. -Debrided some loose skin along the wound without any complications or bleeding.  -There is swelling, erythema, warmth from the toe to the leg distal to the knee. Although he is afebrile I think he would benefit from IV antibiotics. I have directed the patient to go to the ER. I called the [facility] ER to inform them of his  arrival. If admitted I will follow the patient as well.   Celesta Gentile, DPM

## 2020-04-02 NOTE — Telephone Encounter (Signed)
Spoke the PA- will get dose of antibiotics in the ER and will discharge home with close follow up  [place] or [facility]- can you make an appointment to see me Monday or Tuesday for infection/wound check? Thanks.

## 2020-04-03 NOTE — Addendum Note (Signed)
Addended by: Celene Skeen A on: 04/03/2020 01:04 PM   Modules accepted: Orders

## 2020-04-05 ENCOUNTER — Telehealth: Payer: Self-pay | Admitting: Podiatry

## 2020-04-05 NOTE — Telephone Encounter (Signed)
Received the following message from [place], [place]- Patient's wife called and stated that there was to be a cream that the patient was to get and was sent to the hospital and they did a shot and there has been some puss to it and the phone number is [phone]. Thanks Lattie Haw   ----  I attempted to call the patient twice at the number provided but went to voicemail. The voicemail was full and was not able to leave a message.

## 2020-04-07 ENCOUNTER — Other Ambulatory Visit: Payer: Self-pay

## 2020-04-07 ENCOUNTER — Ambulatory Visit (INDEPENDENT_AMBULATORY_CARE_PROVIDER_SITE_OTHER): Payer: PPO | Admitting: Podiatry

## 2020-04-07 DIAGNOSIS — E1149 Type 2 diabetes mellitus with other diabetic neurological complication: Secondary | ICD-10-CM | POA: Diagnosis not present

## 2020-04-07 DIAGNOSIS — L97511 Non-pressure chronic ulcer of other part of right foot limited to breakdown of skin: Secondary | ICD-10-CM | POA: Diagnosis not present

## 2020-04-07 DIAGNOSIS — L03115 Cellulitis of right lower limb: Secondary | ICD-10-CM | POA: Diagnosis not present

## 2020-04-07 MED ORDER — SANTYL 250 UNIT/GM EX OINT: 1.0000 "application " | TOPICAL_OINTMENT | Freq: Every day | CUTANEOUS | 0 refills | Status: DC

## 2020-04-08 NOTE — Progress Notes (Signed)
Subjective: 64 year old male presents the office today for evaluation of a wound to his right foot as well as for cellulitis.  After last appointment secondary emergency department.  Labs were mostly unremarkable and he was afebrile.  He was sent home with oral antibiotics which he still taking.  I called him over the weekend.  He did call back and he was using Betadine to the toe for the last couple of days. Denies any systemic complaints such as fevers, chills, nausea, vomiting. No acute changes since last appointment, and no other complaints at this time.   Objective: AAO x3, NAD DP/PT pulses palpable bilaterally, CRT less than 3 seconds Overall edema and erythema significantly improved compared to last week.  Ulceration present the distal aspect of the toe with fibrotic tissue with granulation around the room.  After debridement today it measures 1.5 x 1.5 x 0.3 cm.  Prior to debridement the wound measured 1.4 x 1.4 x 0.1 cm and was superficial.  There is no probing to bone, undermining or tunneling. No pain with calf compression, swelling, warmth, erythema      Assessment: Ulceration right foot with resolving cellulitis  Plan: -All treatment options discussed with the patient including all alternatives, risks, complications.  -Sharply debrided the wound today utilizing the 312 with scalpel down to healthy, bleeding, granulation tissue with minimal blood loss. The wound has been debrided I would hope that the wound progressed and continue to heal better.  There is still some fibrotic tissue present within the wound.  Due to this we will start Santyl dressing changes now that the infection appears to be substantially improved.  Continue oral antibiotics continue offloading and elevation. -Monitor for any clinical signs or symptoms of infection and directed to call the office immediately should any occur or go to the ER. -Patient encouraged to call the office with any questions, concerns,  change in symptoms.   Trula Slade DPM

## 2020-04-15 ENCOUNTER — Encounter: Payer: Self-pay | Admitting: Nurse Practitioner

## 2020-04-15 ENCOUNTER — Ambulatory Visit: Payer: PPO | Admitting: Podiatry

## 2020-04-15 ENCOUNTER — Ambulatory Visit (INDEPENDENT_AMBULATORY_CARE_PROVIDER_SITE_OTHER): Payer: PPO | Admitting: Nurse Practitioner

## 2020-04-15 ENCOUNTER — Other Ambulatory Visit: Payer: Self-pay

## 2020-04-15 VITALS — BP 157/73 | HR 79 | Temp 96.8°F | Ht 71.0 in | Wt 206.0 lb

## 2020-04-15 DIAGNOSIS — E11621 Type 2 diabetes mellitus with foot ulcer: Secondary | ICD-10-CM | POA: Diagnosis not present

## 2020-04-15 DIAGNOSIS — L97512 Non-pressure chronic ulcer of other part of right foot with fat layer exposed: Secondary | ICD-10-CM | POA: Diagnosis not present

## 2020-04-15 NOTE — Patient Instructions (Addendum)
Diabetic ulcer of toe of right foot associated with type 2 diabetes mellitus, with fat layer exposed [facility]) Patient is a 64 years old male who present with foot ulcer on the right great toe. Ulcer is well managed, no signs or symptom of infection, ulcer size is smaller, provided education to patient  on infection prevention, keeping wound clean and dress as directed. patient is currently using Santyl, and Clindamycin.  patient will keep his appointment with podiatry on saturday. June  6th .     Infection Prevention in the Home If you have an infection, may have been exposed to an infection, or are taking care of someone who has an infection, it is important to know how to keep the infection from spreading. Follow your health care provider's instructions and use these guidelines to help stop the spread of infection. How infections are spread In order for an infection to spread, the following must be present:  A germ. This may be a virus, bacteria, fungus, or parasite.  A place for the germ to live. This may be: ? On or in a person, animal, plant, or food. ? In soil or water. ? On surfaces, such as a door handle.  A person or animal who can develop a disease if the germ enters the body (host). The host does not have resistance to the germ.  A way for the germ to enter the host. This may occur by: ? Direct contact with an infected person or animal. This can happen through shaking hands or hugging. Some germs can also travel through the air and spread to others. This can happen when an infected person coughs or sneezes on or near other people. ? Indirect contact. This occurs when the germ enters the host through contact with an infected object. Examples include:  Eating or drinking food or water that has the germ (is contaminated).  Touching a contaminated surface with your hands, and then touching your face, eyes, nose, or mouth. Supplies needed:  Soap.  Alcohol-based hand  sanitizer.  Standard cleaning products.  Disinfectants, such as bleach.  Reusable cleaning cloths, sponges, or paper towels.  Disposable or reusable utility gloves. How to prevent infection from spreading There are several things that you can do to help prevent infection from spreading. Take these general actions Everyone should take the following actions to prevent the spread of infection:  Wash your hands often with soap and water for at least 20 seconds. If soap and water are not available, use alcohol-based hand sanitizer.  Avoid touching your face, mouth, nose, or eyes.  Cough or sneeze into a tissue, sleeve, or elbow instead of into your hand or into the air. ? If you cough or sneeze into a tissue, throw it away immediately and wash your hands.  Keep your bathroom clean  Provide soap.  Change towels and washcloths frequently.  Change toothbrushes often and store them separately in a clean, dry place.  Clean and disinfect all surfaces, including the toilet, floor, tub, shower, and sink.  Do not share personal items, such as razors, toothbrushes, deodorant, combs, brushes, towels, and washcloths. Maintain hygiene in the [facility] your hands before and after preparing food and before you eat.  Clean the inside of your refrigerator each week.  Keep your refrigerator set at 62F (4C) or less, and set your freezer at 24F (-18C) or less.  Keep work surfaces clean. Disinfect them regularly.  Wash your dishes in hot, soapy water. Air-dry your  dishes or use a dishwasher.  Do not share dishes or eating utensils. Handle food safely  Store food carefully.  Refrigerate leftovers promptly in covered containers.  Throw out stale or spoiled food.  Thaw foods in the refrigerator or microwave, not at room temperature.  Serve foods at the proper temperature. Do not eat raw meat. Make sure it is cooked to the appropriate temperature. Cook eggs until they are  firm.  Wash fruits and vegetables under running water.  Use separate cutting boards, plates, and utensils for raw foods and cooked foods.  Use a clean spoon each time you sample food while cooking. Do laundry the right way  Wear gloves if laundry is visibly soiled.  Do not shake soiled laundry. Doing that may send germs into the air.  Wash laundry in hot water.  If you cannot wash the laundry right away, place it in a plastic bag and wash it as soon as possible. Be careful around animals and pets  Wash your hands before and after touching animals.  If you have a pet, ensure that your pet stays clean. Do not let people with weak immune systems touch bird droppings, fish tank water, or a litter box. ? If you have a pet cage or litter box, be sure to clean it every day.  If you are sick, stay away from animals and have someone else care for them if possible. How to clean and disinfect objects and surfaces Precautions  Some disinfectants work for certain germs and not others. Read the manufacturer's instructions or read online resources to determine if the product you are using will work for the germ you are trying to remove.  If you choose to use bleach, use it safely. Never mix it with other cleaning products, especially those that contain ammonia. This mixture can create a dangerous gas that may be deadly.  Keep proper movement of fresh air in your home (ventilation).  Pour used mop water down the utility sink or toilet. Do not pour this water down the kitchen sink. Objects and surfaces   If surfaces are visibly soiled, clean them first with soap and water before disinfecting.  Disinfect surfaces that are frequently touched every day. This may include: ? Counters. ? Tables. ? Doorknobs. ? Sinks and faucets. ? Electronics, such as:  Architectural technologist.  Remote controls.  Keyboards.  Computers and tablets. Cleaning supplies Some cleaning supplies can breed germs. Take good care  of them to prevent germs from spreading. To do this:  Soak toilet brushes, mops, and sponges in bleach and water for 5 minutes after each use, or according to manufacturer's instructions.  Wash reusable cleaning cloths and sanitize sponges after each use.  Throw away disposable gloves after one use.  Replace reusable utility gloves if they are cracked or torn or if they start to peel. Additional actions if you are sick If you live with other people:   Avoid close contact with those around you. Stay at least 3 ft (1 m) away from others, if possible.  Use a separate bathroom, if possible.  If possible, sleep in a separate bedroom or in a separate bed to prevent infecting other household members. ? Change bedroom linens each week or whenever they are soiled.  Have everyone in the household wash hands often with soap and water. If soap and water are not available, use alcohol-based hand sanitizer. In general:  Stay home except to get medical care. Call ahead before visiting your health care provider.  Ask others to get groceries and household supplies and to refill prescriptions for you.  Avoid public areas. Try not to take public transportation.  If you can, wear a mask if you need to go out of the house, or if you are in close contact with someone who is not sick.  Avoid visitors until you have completely recovered, or until you have no signs and symptoms of infection.  Avoid preparing food or providing care for others. If you must prepare food or provide care for others, wear a mask and wash your hands before and after doing these things. Where to find more information  Centers for Disease Control and Prevention: PromotionalReview.nl  World Health Organization [facility]): PrintStats.tn  Association for Professionals in Infection Control and Epidemiology:  professionals.site.StadiumBlog.se Summary  It is important to know how to keep the infection from spreading.  Make sure everyone in your household washes their hands often with soap and water.  Disinfect surfaces that are frequently touched every day.  If you are sick, stay home except to get medical care. This information is not intended to replace advice given to you by your health care provider. Make sure you discuss any questions you have with your health care provider. Document Released: 08/10/2008 Document Revised: 02/27/2019 Document Reviewed: 01/26/2019 Elsevier Patient Education  [place].

## 2020-04-15 NOTE — Assessment & Plan Note (Signed)
Patient is a 64 years old male who present with foot ulcer on the right great toe. Ulcer is well managed, no signs or symptom of infection, ulcer size is smaller, provided education to patient  on infection prevention, keeping wound clean and dress as directed. patient is currently using Santyl, and Clindamycin.  patient will keep his appointment with podiatry on saturday. June  6th .

## 2020-04-15 NOTE — Progress Notes (Signed)
Established Patient Office Visit  Subjective:  Patient ID: [name], male    DOB: 09-09-1956  Age: 64 y.o. MRN: [id number]  CC:  Chief Complaint  Patient presents with  . Diabetes  . Foot Ulcer    Right great Toe     HPI [name] presents for follow up right great toe ulcer. Patient reports that he did not know when the ulcer started. 3 weeks ago he noticed his toes bleeding when he checked his right toe it was already severe. patient has since been evaluated by the podiatrist, wound is healing well. patient is cleaning ulcer and changing out the dressing daily covering with santyl and po clindamycin.  patient is not reporting pain, fever or signs and symptom of infection.    Past Medical History:  Diagnosis Date  . Chronic kidney disease   . Complication of anesthesia    Hard to wake  . Degenerative joint disease (DJD) of lumbar spine   . Diabetes mellitus without complication ([place])    Type II  . Essential hypertension 02/23/2017  . Hypertension   . Lumbar stenosis   . Proteinuria   . Radicular leg pain     Past Surgical History:  Procedure Laterality Date  . BELPHAROPTOSIS REPAIR    . CARDIAC CATHETERIZATION    . EYE SURGERY    . HEMORRHOID SURGERY    . TRANSFORAMINAL LUMBAR INTERBODY FUSION (TLIF) WITH PEDICLE SCREW FIXATION 2 LEVEL N/A 07/06/2018   Procedure: TRANSFORAMINAL LUMBAR INTERBODY FUSION (TLIF) L4-S1;  Surgeon: Melina Schools, MD;  Location: [facility];  Service: Orthopedics;  Laterality: N/A;    Family History  Problem Relation Age of Onset  . Heart attack Father   . Hypertension Father   . Vision loss Sister        one eye  . Cancer Brother        skin  . Diabetes Brother     Social History   Socioeconomic History  . Marital status: Married    Spouse name: Not on file  . Number of children: Not on file  . Years of education: Not on file  . Highest education level: Not on file  Occupational History  . Not on file    Tobacco Use  . Smoking status: Never Smoker  . Smokeless tobacco: Never Used  Substance and Sexual Activity  . Alcohol use: Yes    Alcohol/week: 6.0 standard drinks    Types: 6 Cans of beer per week  . Drug use: No  . Sexual activity: Yes  Other Topics Concern  . Not on file  Social History Narrative  . Not on file   Social Determinants of Health   Financial Resource Strain:   . Difficulty of Paying Living Expenses:   Food Insecurity:   . Worried About Charity fundraiser in the Last Year:   . Arboriculturist in the Last Year:   Transportation Needs:   . Film/video editor (Medical):   Marland Kitchen Lack of Transportation (Non-Medical):   Physical Activity:   . Days of Exercise per Week:   . Minutes of Exercise per Session:   Stress:   . Feeling of Stress :   Social Connections:   . Frequency of Communication with Friends and Family:   . Frequency of Social Gatherings with Friends and Family:   . Attends Religious Services:   . Active Member of Clubs or Organizations:   . Attends Archivist Meetings:   .  Marital Status:   Intimate Partner Violence:   . Fear of Current or Ex-Partner:   . Emotionally Abused:   Marland Kitchen Physically Abused:   . Sexually Abused:     Outpatient Medications Prior to Visit  Medication Sig Dispense Refill  . amLODipine (NORVASC) 10 MG tablet Take 1 tablet (10 mg total) by mouth daily. 90 tablet 3  . cholecalciferol (VITAMIN D3) 25 MCG (1000 UNIT) tablet Take 2,000 Units by mouth daily.    . clindamycin (CLEOCIN) 150 MG capsule Take 2 capsules (300 mg total) by mouth 3 (three) times daily for 14 days. 84 capsule 0  . cloNIDine (CATAPRES) 0.3 MG tablet Take 1 tablet (0.3 mg total) by mouth 2 (two) times daily. 180 tablet 3  . collagenase (SANTYL) ointment Apply 1 application topically daily. 30 g 0  . finasteride (PROSCAR) 5 MG tablet Take 5 mg by mouth daily.    Marland Kitchen gabapentin (NEURONTIN) 100 MG capsule Take 100 mg by mouth 2 (two) times daily.     . insulin degludec (TRESIBA FLEXTOUCH) 100 UNIT/ML SOPN FlexTouch Pen Inject 0.26-0.6 mLs (26-60 Units total) into the skin daily. Increase by one unit daily until fasting sugar is 150 (Patient taking differently: Inject 20 Units into the skin daily. Increase by one unit daily until fasting sugar is 150) 30 mL 5  . Insulin Pen Needle 32G X 4 MM MISC Use with insulin daily Dx E10.22 100 each 3  . irbesartan (AVAPRO) 300 MG tablet irbesartan 300 mg tablet (Patient taking differently: Take 300 mg by mouth daily. ) 90 tablet 3  . metFORMIN (GLUCOPHAGE-XR) 500 MG 24 hr tablet Take 2 tablets (1,000 mg total) by mouth 2 (two) times daily. TAKE  (1)  TABLET TWICE A DAY. (Patient taking differently: Take 750 mg by mouth 2 (two) times daily. ) 360 tablet 1  . spironolactone (ALDACTONE) 25 MG tablet Take 25 mg by mouth daily.    . tamsulosin (FLOMAX) 0.4 MG CAPS capsule Take 0.4 mg by mouth daily.     No facility-administered medications prior to visit.    No Known Allergies  ROS Review of Systems  Constitutional: Negative.  Negative for fever.  HENT: Negative.   Eyes: Negative.   Respiratory: Negative.   Cardiovascular: Negative.   Gastrointestinal: Negative.   Skin: Positive for color change and wound.  Neurological: Negative for light-headedness and headaches.  Psychiatric/Behavioral: The patient is not nervous/anxious.    The 10-year ASCVD risk score Mikey Bussing DC Brooke Bonito., et al., 2013) is: 35.8%   Values used to calculate the score:     Age: 46 years     Sex: Male     Is Non-Hispanic African American: No     Diabetic: Yes     Tobacco smoker: No     Systolic Blood Pressure: 347 mmHg     Is BP treated: Yes     HDL Cholesterol: 37 mg/dL     Total Cholesterol: 138 mg/dL   Objective:    Physical Exam  Constitutional: He is oriented to person, place, and time. He appears well-developed and well-nourished.  HENT:  Mouth/Throat: Oropharynx is clear and moist.  Eyes: Conjunctivae are normal.   Cardiovascular: Normal rate.  Pulmonary/Chest: Breath sounds normal.  Abdominal: Bowel sounds are normal.  Musculoskeletal:        General: No tenderness.     Cervical back: Neck supple.  Neurological: He is oriented to person, place, and time.  Skin: Skin is warm. There is erythema.  Psychiatric: He has a normal mood and affect.    BP (!) 157/73   Pulse 79   Temp (!) 96.8 F (36 C) (Temporal)   Ht 5\' 11"  (1.803 m)   Wt 206 lb (93.4 kg)   SpO2 100%   BMI 28.73 kg/m  Wt Readings from Last 3 Encounters:  04/15/20 206 lb (93.4 kg)  04/01/20 208 lb (94.3 kg)  12/06/19 215 lb 3.2 oz (97.6 kg)     Health Maintenance Due  Topic Date Due  . COVID-19 Vaccine (1) Never done  . TETANUS/TDAP  Never done  . COLONOSCOPY  Never done  . OPHTHALMOLOGY EXAM  07/24/2016  . PNA vac Low Risk Adult (1 of 2 - PCV13) 01/11/2019      Lab Results  Component Value Date   TSH 3.340 03/06/2019   Lab Results  Component Value Date   WBC 12.1 (H) 04/02/2020   HGB 12.1 (L) 04/02/2020   HCT 36.2 (L) 04/02/2020   MCV 94.0 04/02/2020   PLT 216 04/02/2020   Lab Results  Component Value Date   NA 133 (L) 04/02/2020   K 4.8 04/02/2020   CO2 18 (L) 04/02/2020   GLUCOSE 198 (H) 04/02/2020   BUN 21 04/02/2020   CREATININE 1.13 04/02/2020   BILITOT 1.3 (H) 04/02/2020   ALKPHOS 76 04/02/2020   AST 18 04/02/2020   ALT 14 04/02/2020   PROT 7.2 04/02/2020   ALBUMIN 3.8 04/02/2020   CALCIUM 9.6 04/02/2020   ANIONGAP 14 04/02/2020   Lab Results  Component Value Date   CHOL 138 10/01/2019   Lab Results  Component Value Date   HDL 37 (L) 10/01/2019   Lab Results  Component Value Date   LDLCALC 71 10/01/2019   Lab Results  Component Value Date   TRIG 175 (H) 10/01/2019   Lab Results  Component Value Date   CHOLHDL 3.7 10/01/2019   Lab Results  Component Value Date   HGBA1C 7.8 (H) 04/01/2020      Assessment & Plan:   Diabetic ulcer of toe of right foot associated with type  2 diabetes mellitus, with fat layer exposed [facility]) Patient is a 64 years old male who present with foot ulcer on the right great toe. Ulcer is well managed, no signs or symptom of infection, ulcer size is smaller, provided education to patient  on infection prevention, keeping wound clean and dress as directed. patient is currently using Santyl, and Clindamycin.  patient will keep his appointment with podiatry on saturday. June  6th .   Problem List Items Addressed This Visit      Endocrine   Diabetic ulcer of toe of right foot associated with type 2 diabetes mellitus, with fat layer exposed ([place]) - Primary    Patient is a 64 years old male who present with foot ulcer on the right great toe. Ulcer is well managed, no signs or symptom of infection, ulcer size is smaller, provided education to patient  on infection prevention, keeping wound clean and dress as directed. patient is currently using Santyl, and Clindamycin.  patient will keep his appointment with podiatry on saturday. June  6th .            Follow-up: Return if symptoms worsen or fail to improve.    Ivy Lynn, NP

## 2020-04-18 ENCOUNTER — Other Ambulatory Visit: Payer: Self-pay | Admitting: *Deleted

## 2020-04-18 DIAGNOSIS — R739 Hyperglycemia, unspecified: Secondary | ICD-10-CM

## 2020-04-18 DIAGNOSIS — N182 Chronic kidney disease, stage 2 (mild): Secondary | ICD-10-CM

## 2020-04-18 DIAGNOSIS — E1122 Type 2 diabetes mellitus with diabetic chronic kidney disease: Secondary | ICD-10-CM

## 2020-04-18 MED ORDER — TRESIBA FLEXTOUCH 100 UNIT/ML ~~LOC~~ SOPN: 26.0000 [IU] | PEN_INJECTOR | Freq: Every day | SUBCUTANEOUS | 0 refills | Status: DC

## 2020-04-19 ENCOUNTER — Other Ambulatory Visit: Payer: Self-pay

## 2020-04-19 ENCOUNTER — Ambulatory Visit: Payer: PPO | Admitting: Podiatry

## 2020-04-19 DIAGNOSIS — L97511 Non-pressure chronic ulcer of other part of right foot limited to breakdown of skin: Secondary | ICD-10-CM | POA: Diagnosis not present

## 2020-04-19 DIAGNOSIS — E1149 Type 2 diabetes mellitus with other diabetic neurological complication: Secondary | ICD-10-CM

## 2020-04-21 NOTE — Progress Notes (Signed)
Subjective: 64 year old male presents the office today for evaluation of a wound to his right foot as well as for cellulitis.  He has been applying Santyl to the wound daily.  Overall he thinks the wound is looking better and getting smaller.  Denies any drainage or pus.  Denies any swelling or redness or any red streaks.  He has no pain. Denies any systemic complaints such as fevers, chills, nausea, vomiting. No acute changes since last appointment, and no other complaints at this time.   Objective: AAO x3, NAD DP/PT pulses palpable bilaterally, CRT less than 3 seconds Ulceration continues to the distal aspect the right hallux.  Granulation tissue along the periphery with central fibrotic wound.  Today the wound measures 1.5 x 1.2 x 0.2 cm and there is no probing to bone, undermining or tunneling.  There is no surrounding erythema, ascending cellulitis.  No fluctuation or crepitation.  There is no malodor. No pain with calf compression, swelling, warmth, erythema        Assessment: Ulceration right foot with resolved cellulitis  Plan: -All treatment options discussed with the patient including all alternatives, risks, complications.  -Sharply debrided the wound today utilizing the 312 with scalpel down to healthy, bleeding, granulation tissue with minimal blood loss.  Continue Santyl dressing changes daily and offloading.  Monitor for any signs or symptoms of infection.   Trula Slade DPM

## 2020-05-02 ENCOUNTER — Other Ambulatory Visit: Payer: Self-pay

## 2020-05-02 ENCOUNTER — Ambulatory Visit: Payer: PPO | Admitting: Podiatry

## 2020-05-02 DIAGNOSIS — M792 Neuralgia and neuritis, unspecified: Secondary | ICD-10-CM

## 2020-05-02 DIAGNOSIS — L97511 Non-pressure chronic ulcer of other part of right foot limited to breakdown of skin: Secondary | ICD-10-CM | POA: Diagnosis not present

## 2020-05-02 DIAGNOSIS — E1149 Type 2 diabetes mellitus with other diabetic neurological complication: Secondary | ICD-10-CM | POA: Diagnosis not present

## 2020-05-02 MED ORDER — GABAPENTIN 100 MG PO CAPS: 100.0000 mg | ORAL_CAPSULE | Freq: Three times a day (TID) | ORAL | 3 refills | Status: DC

## 2020-05-06 ENCOUNTER — Encounter: Payer: Self-pay | Admitting: Podiatry

## 2020-05-06 ENCOUNTER — Ambulatory Visit: Payer: PPO | Admitting: Podiatry

## 2020-05-06 ENCOUNTER — Inpatient Hospital Stay (HOSPITAL_COMMUNITY)
Admission: EM | Admit: 2020-05-06 | Discharge: 2020-05-09 | DRG: 638 | Disposition: A | Discharge status: Home Health | Payer: PPO | Source: Ambulatory Visit | Attending: Family Medicine | Admitting: Family Medicine

## 2020-05-06 ENCOUNTER — Telehealth: Payer: Self-pay | Admitting: *Deleted

## 2020-05-06 ENCOUNTER — Ambulatory Visit (INDEPENDENT_AMBULATORY_CARE_PROVIDER_SITE_OTHER): Payer: PPO

## 2020-05-06 ENCOUNTER — Other Ambulatory Visit: Payer: Self-pay

## 2020-05-06 ENCOUNTER — Other Ambulatory Visit: Payer: Self-pay | Admitting: *Deleted

## 2020-05-06 ENCOUNTER — Encounter (HOSPITAL_COMMUNITY): Payer: Self-pay | Admitting: *Deleted

## 2020-05-06 DIAGNOSIS — L03115 Cellulitis of right lower limb: Secondary | ICD-10-CM | POA: Diagnosis present

## 2020-05-06 DIAGNOSIS — Z833 Family history of diabetes mellitus: Secondary | ICD-10-CM | POA: Diagnosis not present

## 2020-05-06 DIAGNOSIS — G4733 Obstructive sleep apnea (adult) (pediatric): Secondary | ICD-10-CM | POA: Diagnosis present

## 2020-05-06 DIAGNOSIS — L089 Local infection of the skin and subcutaneous tissue, unspecified: Secondary | ICD-10-CM

## 2020-05-06 DIAGNOSIS — L03031 Cellulitis of right toe: Secondary | ICD-10-CM | POA: Diagnosis present

## 2020-05-06 DIAGNOSIS — E11621 Type 2 diabetes mellitus with foot ulcer: Secondary | ICD-10-CM | POA: Diagnosis present

## 2020-05-06 DIAGNOSIS — Z03818 Encounter for observation for suspected exposure to other biological agents ruled out: Secondary | ICD-10-CM | POA: Diagnosis not present

## 2020-05-06 DIAGNOSIS — Z79899 Other long term (current) drug therapy: Secondary | ICD-10-CM

## 2020-05-06 DIAGNOSIS — E1169 Type 2 diabetes mellitus with other specified complication: Secondary | ICD-10-CM | POA: Diagnosis present

## 2020-05-06 DIAGNOSIS — S91101A Unspecified open wound of right great toe without damage to nail, initial encounter: Secondary | ICD-10-CM | POA: Diagnosis not present

## 2020-05-06 DIAGNOSIS — Z9989 Dependence on other enabling machines and devices: Secondary | ICD-10-CM | POA: Diagnosis not present

## 2020-05-06 DIAGNOSIS — L97511 Non-pressure chronic ulcer of other part of right foot limited to breakdown of skin: Secondary | ICD-10-CM

## 2020-05-06 DIAGNOSIS — M48061 Spinal stenosis, lumbar region without neurogenic claudication: Secondary | ICD-10-CM | POA: Diagnosis present

## 2020-05-06 DIAGNOSIS — I152 Hypertension secondary to endocrine disorders: Secondary | ICD-10-CM | POA: Diagnosis present

## 2020-05-06 DIAGNOSIS — Z8249 Family history of ischemic heart disease and other diseases of the circulatory system: Secondary | ICD-10-CM | POA: Diagnosis not present

## 2020-05-06 DIAGNOSIS — M86171 Other acute osteomyelitis, right ankle and foot: Secondary | ICD-10-CM | POA: Diagnosis present

## 2020-05-06 DIAGNOSIS — E785 Hyperlipidemia, unspecified: Secondary | ICD-10-CM | POA: Diagnosis present

## 2020-05-06 DIAGNOSIS — N401 Enlarged prostate with lower urinary tract symptoms: Secondary | ICD-10-CM | POA: Diagnosis present

## 2020-05-06 DIAGNOSIS — R519 Headache, unspecified: Secondary | ICD-10-CM | POA: Diagnosis present

## 2020-05-06 DIAGNOSIS — E11628 Type 2 diabetes mellitus with other skin complications: Secondary | ICD-10-CM

## 2020-05-06 DIAGNOSIS — E1159 Type 2 diabetes mellitus with other circulatory complications: Secondary | ICD-10-CM | POA: Diagnosis present

## 2020-05-06 DIAGNOSIS — Z20822 Contact with and (suspected) exposure to covid-19: Secondary | ICD-10-CM | POA: Diagnosis present

## 2020-05-06 DIAGNOSIS — L97512 Non-pressure chronic ulcer of other part of right foot with fat layer exposed: Secondary | ICD-10-CM | POA: Diagnosis present

## 2020-05-06 DIAGNOSIS — Z794 Long term (current) use of insulin: Secondary | ICD-10-CM | POA: Diagnosis not present

## 2020-05-06 DIAGNOSIS — E1022 Type 1 diabetes mellitus with diabetic chronic kidney disease: Secondary | ICD-10-CM

## 2020-05-06 DIAGNOSIS — I10 Essential (primary) hypertension: Secondary | ICD-10-CM | POA: Diagnosis not present

## 2020-05-06 DIAGNOSIS — L97519 Non-pressure chronic ulcer of other part of right foot with unspecified severity: Secondary | ICD-10-CM | POA: Diagnosis not present

## 2020-05-06 DIAGNOSIS — R3914 Feeling of incomplete bladder emptying: Secondary | ICD-10-CM | POA: Diagnosis present

## 2020-05-06 DIAGNOSIS — R739 Hyperglycemia, unspecified: Secondary | ICD-10-CM

## 2020-05-06 DIAGNOSIS — M869 Osteomyelitis, unspecified: Secondary | ICD-10-CM

## 2020-05-06 DIAGNOSIS — N182 Chronic kidney disease, stage 2 (mild): Secondary | ICD-10-CM | POA: Diagnosis present

## 2020-05-06 DIAGNOSIS — R809 Proteinuria, unspecified: Secondary | ICD-10-CM | POA: Diagnosis present

## 2020-05-06 DIAGNOSIS — E1122 Type 2 diabetes mellitus with diabetic chronic kidney disease: Secondary | ICD-10-CM | POA: Diagnosis present

## 2020-05-06 DIAGNOSIS — E119 Type 2 diabetes mellitus without complications: Secondary | ICD-10-CM

## 2020-05-06 LAB — CBC WITH DIFFERENTIAL/PLATELET
Abs Immature Granulocytes: 0.04 10*3/uL (ref 0.00–0.07)
Basophils Absolute: 0.1 10*3/uL (ref 0.0–0.1)
Basophils Relative: 0 %
Eosinophils Absolute: 0.1 10*3/uL (ref 0.0–0.5)
Eosinophils Relative: 1 %
HCT: 38.3 % — ABNORMAL LOW (ref 39.0–52.0)
Hemoglobin: 13 g/dL (ref 13.0–17.0)
Immature Granulocytes: 0 %
Lymphocytes Relative: 14 %
Lymphs Abs: 1.9 10*3/uL (ref 0.7–4.0)
MCH: 30.8 pg (ref 26.0–34.0)
MCHC: 33.9 g/dL (ref 30.0–36.0)
MCV: 90.8 fL (ref 80.0–100.0)
Monocytes Absolute: 1 10*3/uL (ref 0.1–1.0)
Monocytes Relative: 7 %
Neutro Abs: 11.1 10*3/uL — ABNORMAL HIGH (ref 1.7–7.7)
Neutrophils Relative %: 78 %
Platelets: 213 10*3/uL (ref 150–400)
RBC: 4.22 MIL/uL (ref 4.22–5.81)
RDW: 12.6 % (ref 11.5–15.5)
WBC: 14.3 10*3/uL — ABNORMAL HIGH (ref 4.0–10.5)
nRBC: 0 % (ref 0.0–0.2)

## 2020-05-06 LAB — BASIC METABOLIC PANEL
Anion gap: 11 (ref 5–15)
BUN: 17 mg/dL (ref 8–23)
CO2: 24 mmol/L (ref 22–32)
Calcium: 9.6 mg/dL (ref 8.9–10.3)
Chloride: 98 mmol/L (ref 98–111)
Creatinine, Ser: 1.28 mg/dL — ABNORMAL HIGH (ref 0.61–1.24)
GFR calc Af Amer: >60 mL/min (ref >60)
GFR calc non Af Amer: 58 mL/min — ABNORMAL LOW (ref >60)
Glucose, Bld: 177 mg/dL — ABNORMAL HIGH (ref 70–99)
Potassium: 4.3 mmol/L (ref 3.5–5.1)
Sodium: 133 mmol/L — ABNORMAL LOW (ref 135–145)

## 2020-05-06 LAB — CBG MONITORING, ED: Glucose-Capillary: 174 mg/dL — ABNORMAL HIGH (ref 70–99)

## 2020-05-06 MED ORDER — PIPERACILLIN-TAZOBACTAM 3.375 G IVPB 30 MIN
3.3750 g | Freq: Once | INTRAVENOUS | Status: AC
  Administered 2020-05-06: 3.375 g via INTRAVENOUS
  Filled 2020-05-06: qty 50

## 2020-05-06 MED ORDER — FINASTERIDE 5 MG PO TABS: 5.0000 mg | ORAL_TABLET | Freq: Every day | ORAL | 0 refills | Status: DC

## 2020-05-06 NOTE — ED Triage Notes (Signed)
Pt seen foot MD and sent here for IV antibiotics.   Podiatry concerned that PO antibiotics would not be appropriate due to right foot with swelling, redness and warmth.  Pt is diabetic, states cbg 160's

## 2020-05-06 NOTE — Progress Notes (Signed)
Subjective:  Patient ID: [name], male    DOB: 03-06-1956,  MRN: [id number]  No chief complaint on file.   64 y.o. male presents for wound care.  Patient presents with right hallux distal tip ulceration.  Patient states that he has been doing really well.  He has been keeping a bandage over it.  He has been applying Santyl to it.  He has not been fever chills vomiting.  He is known to Dr. Earleen Newport who has been keeping a close eye on him.  He denies any other acute complaints.  He would also like to discuss neuropathy and possible discuss treatment options for it.   Review of Systems: Negative except as noted in the HPI. Denies N/V/F/Ch.  Past Medical History:  Diagnosis Date  . Chronic kidney disease   . Complication of anesthesia    Hard to wake  . Degenerative joint disease (DJD) of lumbar spine   . Diabetes mellitus without complication ([place])    Type II  . Essential hypertension 02/23/2017  . Hypertension   . Lumbar stenosis   . Proteinuria   . Radicular leg pain     Current Outpatient Medications:  .  amLODipine (NORVASC) 10 MG tablet, Take 1 tablet (10 mg total) by mouth daily., Disp: 90 tablet, Rfl: 3 .  cholecalciferol (VITAMIN D3) 25 MCG (1000 UNIT) tablet, Take 2,000 Units by mouth daily., Disp: , Rfl:  .  cloNIDine (CATAPRES) 0.3 MG tablet, Take 1 tablet (0.3 mg total) by mouth 2 (two) times daily., Disp: 180 tablet, Rfl: 3 .  collagenase (SANTYL) ointment, Apply 1 application topically daily., Disp: 30 g, Rfl: 0 .  finasteride (PROSCAR) 5 MG tablet, Take 5 mg by mouth daily., Disp: , Rfl:  .  gabapentin (NEURONTIN) 100 MG capsule, Take 100 mg by mouth 2 (two) times daily., Disp: , Rfl:  .  gabapentin (NEURONTIN) 100 MG capsule, Take 1 capsule (100 mg total) by mouth 3 (three) times daily., Disp: 90 capsule, Rfl: 3 .  insulin degludec (TRESIBA FLEXTOUCH) 100 UNIT/ML FlexTouch Pen, Inject 0.26-0.6 mLs (26-60 Units total) into the skin daily. Increase by one unit  daily until fasting sugar is 150, Disp: 45 mL, Rfl: 0 .  Insulin Pen Needle 32G X 4 MM MISC, Use with insulin daily Dx E10.22, Disp: 100 each, Rfl: 3 .  irbesartan (AVAPRO) 300 MG tablet, irbesartan 300 mg tablet (Patient taking differently: Take 300 mg by mouth daily. ), Disp: 90 tablet, Rfl: 3 .  metFORMIN (GLUCOPHAGE-XR) 500 MG 24 hr tablet, Take 2 tablets (1,000 mg total) by mouth 2 (two) times daily. TAKE  (1)  TABLET TWICE A DAY. (Patient taking differently: Take 750 mg by mouth 2 (two) times daily. ), Disp: 360 tablet, Rfl: 1 .  spironolactone (ALDACTONE) 25 MG tablet, Take 25 mg by mouth daily., Disp: , Rfl:  .  tamsulosin (FLOMAX) 0.4 MG CAPS capsule, Take 0.4 mg by mouth daily., Disp: , Rfl:   Social History   Tobacco Use  Smoking Status Never Smoker  Smokeless Tobacco Never Used    No Known Allergies Objective:  There were no vitals filed for this visit. There is no height or weight on file to calculate BMI. Constitutional Well developed. Well nourished.  Vascular Dorsalis pedis pulses palpable bilaterally. Posterior tibial pulses palpable bilaterally. Capillary refill normal to all digits.  No cyanosis or clubbing noted. Pedal hair growth normal.  Neurologic Normal speech. Oriented to person, place, and time. Protective sensation absent  Dermatologic Wound Location: Right distal tip ulceration limited to the breakdown of the skin.  Does not probe down to bone.  No clinical signs of infection noted.  No malodor present.  No cellulitis present. Wound Base: Mixed Granular/Fibrotic Peri-wound: Calloused Exudate: Scant/small amount Serous exudate Wound Measurements: -See below  Orthopedic: No pain to palpation either foot.   Radiographs: None Assessment:  No diagnosis found. Plan:  Patient was evaluated and treated and all questions answered.  Ulcer right hallux distal tip ulceration limited to the breakdown of the skin -Debridement as below. -Dressed with Santyl  wet-to-dry, DSD. -Continue off-loading with surgical shoe.  Neuropathic pain -I discussed with the patient the etiology of neuropathic pain and various treatment options were discussed.  I discussed with him that sugar control is the best way to avoid pins-and-needles as well as tingling pain.  Patient states understanding.  He would like to discuss gabapentin.  I believe patient will benefit from gabapentin. -Gabapentin was dispensed  Procedure: Excisional Debridement of Wound Tool: Sharp chisel blade/tissue nipper Rationale: Removal of non-viable soft tissue from the wound to promote healing.  Anesthesia: none Pre-Debridement Wound Measurements: 1.3 cm x 1.1 cm x 0.2 cm  Post-Debridement Wound Measurements: 1.4 cm x 1.1 cm x 0.2 cm  Type of Debridement: Sharp Excisional Tissue Removed: Non-viable soft tissue Blood loss: Minimal (<50cc) Depth of Debridement: subcutaneous tissue. Technique: Sharp excisional debridement to bleeding, viable wound base.  Wound Progress: The wound appears to be regressing very slowly from previous measurements. Site healing conversation 7 Dressing: Dry, sterile, compression dressing. Disposition: Patient tolerated procedure well. Patient to return in 1 week for follow-up.  No follow-ups on file.

## 2020-05-06 NOTE — Progress Notes (Signed)
Subjective: 64 year old male presents the office today for concerns of worsening infection to his right foot and leg.  He is noted swelling to the foot and leg as well as erythema and warmth.  He states he has a fever and chills he has no appetite.  I have last seen him on June 5 and was doing well.  He came to the office on Friday for concerns of swelling.  He states that is continued to worsen and he presents today for further evaluation.  Objective: AAO x3, NAD DP/PT pulses palpable bilaterally, CRT less than 3 seconds Ulceration noted on the plantar aspect of the hallux with fibrotic tissue present.  Small medically trans but no purulence.  There is erythema to the hallux into the leg as well as swelling to the right leg compared to contralateral extremity with ascending cellulitis noted.  There is warmth of the foot and leg compared to the contralateral extremity.  Tenderness palpation of the hallux. No open lesions or pre-ulcerative lesions.  No pain with calf compression, swelling, warmth, erythema  Assessment: Concern for osteomyelitis right hallux with cellulitis  Plan: -All treatment options discussed with the patient including all alternatives, risks, complications.  -X-rays obtained reviewed.  There is a cortical change at the distal aspect the distal phalanx concerning for osteomyelitis. -Given the cellulitis and infection as well as systemic concerns I recommended him to be into the hospital.  I did try to contact [facility] but given that the hospital is very full not sure when he can get a bed and I do not feel comfortable sending him home with oral antibiotics.  He is going to go to [facility] as he lives closer to that hospital.  Unfortunately I do not have privileges there and they are aware but I be happy to continue his care upon discharge from the hospital or if anything is needed please call our office.  Contacted the emergency department on the note of his  arrival. -Patient encouraged to call the office with any questions, concerns, change in symptoms.   Trula Slade DPM

## 2020-05-06 NOTE — ED Provider Notes (Signed)
[facility] Provider Note   CSN: [id number] Arrival date & time: 05/06/20  1643     History Chief Complaint  Patient presents with  . Wound Check    [name] is a 64 y.o. male with a history of DM type II,  hypertension, chronic kidney disease who is under the care of Dr. Earleen Newport for a diabetic foot ulcer which had been healing after being on a course of clindamycin and several office debridements of this wound, was feeling better until last Friday when he started to develop increased swelling and redness which now extends to his right mid anterior tibia.  He endorses having reduced appetite and intermittent chills over the course of the weekend but no documented fevers.  He was seen by Dr. Earleen Newport today and was referred to the hospital for admission and IV antibiotics.  Per patient there was an x-ray done in his office today and there was concern about possible osteomyelitis of his right great toe.  Patient denies any significant pain in his foot, but does have mild aching in his lower leg.  He has advanced peripheral neuropathy.  He has had no antipyretics prior to arrival.  The history is provided by the patient.       Past Medical History:  Diagnosis Date  . Chronic kidney disease   . Complication of anesthesia    Hard to wake  . Degenerative joint disease (DJD) of lumbar spine   . Diabetes mellitus without complication ([place])    Type II  . Essential hypertension 02/23/2017  . Hypertension   . Lumbar stenosis   . Proteinuria   . Radicular leg pain     Patient Active Problem List   Diagnosis Date Noted  . Diabetic ulcer of toe of right foot associated with type 2 diabetes mellitus, with fat layer exposed ([place]) 04/15/2020  . OSA on CPAP 09/01/2018  . S/P lumbar fusion 07/06/2018  . DDD (degenerative disc disease), lumbar 02/28/2018  . Hypertension associated with diabetes ([place]) 02/23/2017  . Benign prostatic hyperplasia with incomplete bladder  emptying 02/23/2017  . Vitamin D deficiency 02/23/2017  . Hyperlipidemia associated with type 2 diabetes mellitus ([place]) 02/23/2017  . Type 2 diabetes mellitus with stage 2 chronic kidney disease, without long-term current use of insulin ([place]) 02/23/2017    Past Surgical History:  Procedure Laterality Date  . BELPHAROPTOSIS REPAIR    . CARDIAC CATHETERIZATION    . EYE SURGERY    . HEMORRHOID SURGERY    . TRANSFORAMINAL LUMBAR INTERBODY FUSION (TLIF) WITH PEDICLE SCREW FIXATION 2 LEVEL N/A 07/06/2018   Procedure: TRANSFORAMINAL LUMBAR INTERBODY FUSION (TLIF) L4-S1;  Surgeon: Melina Schools, MD;  Location: [facility];  Service: Orthopedics;  Laterality: N/A;       Family History  Problem Relation Age of Onset  . Heart attack Father   . Hypertension Father   . Vision loss Sister        one eye  . Cancer Brother        skin  . Diabetes Brother     Social History   Tobacco Use  . Smoking status: Never Smoker  . Smokeless tobacco: Never Used  Vaping Use  . Vaping Use: Never used  Substance Use Topics  . Alcohol use: Yes    Alcohol/week: 6.0 standard drinks    Types: 6 Cans of beer per week  . Drug use: No    Home Medications Prior to Admission medications   Medication Sig  Start Date End Date Taking? Authorizing Provider  amLODipine (NORVASC) 10 MG tablet Take 1 tablet (10 mg total) by mouth daily. 10/01/19  Yes Terald Sleeper, PA-C  cholecalciferol (VITAMIN D3) 25 MCG (1000 UNIT) tablet Take 2,000 Units by mouth daily.   Yes [provider]  cloNIDine (CATAPRES) 0.3 MG tablet Take 1 tablet (0.3 mg total) by mouth 2 (two) times daily. 10/01/19  Yes Terald Sleeper, PA-C  collagenase (SANTYL) ointment Apply 1 application topically daily. Patient taking differently: Apply 1 application topically daily. Applied to affected toe 04/07/20  Yes Trula Slade, DPM  finasteride (PROSCAR) 5 MG tablet Take 1 tablet (5 mg total) by mouth daily. 05/06/20  Yes Gottschalk, Leatrice Jewels M,  DO  gabapentin (NEURONTIN) 100 MG capsule Take 1 capsule (100 mg total) by mouth 3 (three) times daily. 05/02/20  Yes Felipa Furnace, DPM  insulin degludec (TRESIBA FLEXTOUCH) 100 UNIT/ML FlexTouch Pen Inject 0.26-0.6 mLs (26-60 Units total) into the skin daily. Increase by one unit daily until fasting sugar is 150 Patient taking differently: Inject 28 Units into the skin every morning. Increase by one unit daily until fasting sugar is 150 04/18/20  Yes Gottschalk, Ashly M, DO  irbesartan (AVAPRO) 300 MG tablet irbesartan 300 mg tablet Patient taking differently: Take 300 mg by mouth daily.  10/01/19  Yes Terald Sleeper, PA-C  metFORMIN (GLUCOPHAGE-XR) 500 MG 24 hr tablet Take 2 tablets (1,000 mg total) by mouth 2 (two) times daily. TAKE  (1)  TABLET TWICE A DAY. Patient taking differently: Take 750 mg by mouth 2 (two) times daily.  01/01/20  Yes Terald Sleeper, PA-C  spironolactone (ALDACTONE) 25 MG tablet Take 25 mg by mouth daily. 03/26/20  Yes [provider]  tamsulosin (FLOMAX) 0.4 MG CAPS capsule Take 0.4 mg by mouth daily after supper.  03/26/20  Yes [provider]  clindamycin (CLEOCIN) 150 MG capsule Take 300 mg by mouth 3 (three) times daily. [place] ON 04/02/2020--COMPLETED COURSE Patient not taking: Reported on 05/06/2020    [provider]    Allergies    Patient has no known allergies.  Review of Systems   Review of Systems  Constitutional: Positive for appetite change and chills. Negative for fever.  HENT: Negative for congestion and sore throat.   Eyes: Negative.   Respiratory: Negative for chest tightness and shortness of breath.   Cardiovascular: Negative for chest pain.  Gastrointestinal: Negative for abdominal pain and nausea.  Genitourinary: Negative.   Musculoskeletal: Positive for arthralgias. Negative for joint swelling and neck pain.  Skin: Positive for color change. Negative for rash and wound.  Neurological: Negative for  dizziness, weakness, light-headedness, numbness and headaches.  Psychiatric/Behavioral: Negative.     Physical Exam Updated Vital Signs BP (!) 144/70 (BP Location: Right Arm)   Pulse 87   Temp 98.6 F (37 C) (Oral)   Resp 18   Ht 6' (1.829 m)   Wt 93.4 kg   SpO2 99%   BMI 27.94 kg/m   Physical Exam Vitals and nursing note reviewed.  Constitutional:      Appearance: He is well-developed.  HENT:     Head: Normocephalic and atraumatic.  Cardiovascular:     Rate and Rhythm: Normal rate and regular rhythm.     Heart sounds: Normal heart sounds.  Pulmonary:     Effort: Pulmonary effort is normal.     Breath sounds: Normal breath sounds.  Musculoskeletal:  General: Swelling present. Normal range of motion.     Cervical back: Normal range of motion.     Right lower leg: No bony tenderness. 1+ Edema present.     Comments: Erythema of the right anterior mid tibia extending to the dorsal foot.  There is a healed ulceration at his right distal great toe.  Skin:    General: Skin is warm and dry.  Neurological:     Mental Status: He is alert.     ED Results / Procedures / Treatments   Labs (all labs ordered are listed, but only abnormal results are displayed) Labs Reviewed  CBC WITH DIFFERENTIAL/PLATELET - Abnormal; Notable for the following components:      Result Value   WBC 14.3 (*)    HCT 38.3 (*)    Neutro Abs 11.1 (*)    All other components within normal limits  BASIC METABOLIC PANEL - Abnormal; Notable for the following components:   Sodium 133 (*)    Glucose, Bld 177 (*)    Creatinine, Ser 1.28 (*)    GFR calc non Af Amer 58 (*)    All other components within normal limits  CBG MONITORING, ED - Abnormal; Notable for the following components:   Glucose-Capillary 174 (*)    All other components within normal limits  CULTURE, BLOOD (ROUTINE X 2)  CULTURE, BLOOD (ROUTINE X 2)    EKG None  Radiology No results found.  Procedures Procedures (including  critical care time)  Medications Ordered in ED Medications  piperacillin-tazobactam (ZOSYN) IVPB 3.375 g (3.375 g Intravenous New Bag/Given 05/06/20 2322)    ED Course  I have reviewed the triage vital signs and the nursing notes.  Pertinent labs & imaging results that were available during my care of the patient were reviewed by me and considered in my medical decision making (see chart for details).    MDM Rules/Calculators/A&P                          Labs reviewed including an elevated WBC count, blood cultures are pending.  Patient was given IV Zosyn.  He will require admission for further antibiotic therapy.  Call placed to Dr. Olevia Bowens for admission.  Final Clinical Impression(s) / ED Diagnoses Final diagnoses:  Cellulitis of right lower extremity  Diabetic infection of right foot [facility])    Rx / [place] Orders ED Discharge Orders    None       Landis Martins 05/07/20 0033    Ezequiel Essex, MD 05/07/20 [phone]

## 2020-05-06 NOTE — Telephone Encounter (Signed)
I spoke with [facility] - Helene Kelp, she transferred to [facility] and I informed that pt would be coming by private vehicle with right leg cellulitis.

## 2020-05-07 ENCOUNTER — Inpatient Hospital Stay (HOSPITAL_COMMUNITY): Payer: PPO

## 2020-05-07 ENCOUNTER — Ambulatory Visit: Payer: PPO | Admitting: Podiatry

## 2020-05-07 ENCOUNTER — Inpatient Hospital Stay: Payer: Self-pay

## 2020-05-07 ENCOUNTER — Encounter (HOSPITAL_COMMUNITY): Payer: Self-pay | Admitting: Internal Medicine

## 2020-05-07 ENCOUNTER — Emergency Department (HOSPITAL_COMMUNITY): Payer: PPO

## 2020-05-07 DIAGNOSIS — Z9989 Dependence on other enabling machines and devices: Secondary | ICD-10-CM

## 2020-05-07 DIAGNOSIS — L03115 Cellulitis of right lower limb: Secondary | ICD-10-CM | POA: Diagnosis present

## 2020-05-07 DIAGNOSIS — N401 Enlarged prostate with lower urinary tract symptoms: Secondary | ICD-10-CM | POA: Diagnosis present

## 2020-05-07 DIAGNOSIS — G4733 Obstructive sleep apnea (adult) (pediatric): Secondary | ICD-10-CM

## 2020-05-07 DIAGNOSIS — Z8249 Family history of ischemic heart disease and other diseases of the circulatory system: Secondary | ICD-10-CM | POA: Diagnosis not present

## 2020-05-07 DIAGNOSIS — E1169 Type 2 diabetes mellitus with other specified complication: Principal | ICD-10-CM

## 2020-05-07 DIAGNOSIS — E1159 Type 2 diabetes mellitus with other circulatory complications: Secondary | ICD-10-CM

## 2020-05-07 DIAGNOSIS — L03031 Cellulitis of right toe: Secondary | ICD-10-CM | POA: Diagnosis present

## 2020-05-07 DIAGNOSIS — E11628 Type 2 diabetes mellitus with other skin complications: Secondary | ICD-10-CM

## 2020-05-07 DIAGNOSIS — Z833 Family history of diabetes mellitus: Secondary | ICD-10-CM | POA: Diagnosis not present

## 2020-05-07 DIAGNOSIS — E11621 Type 2 diabetes mellitus with foot ulcer: Secondary | ICD-10-CM | POA: Diagnosis present

## 2020-05-07 DIAGNOSIS — R519 Headache, unspecified: Secondary | ICD-10-CM | POA: Diagnosis present

## 2020-05-07 DIAGNOSIS — Z794 Long term (current) use of insulin: Secondary | ICD-10-CM | POA: Diagnosis not present

## 2020-05-07 DIAGNOSIS — E785 Hyperlipidemia, unspecified: Secondary | ICD-10-CM

## 2020-05-07 DIAGNOSIS — L089 Local infection of the skin and subcutaneous tissue, unspecified: Secondary | ICD-10-CM

## 2020-05-07 DIAGNOSIS — I10 Essential (primary) hypertension: Secondary | ICD-10-CM

## 2020-05-07 DIAGNOSIS — L97512 Non-pressure chronic ulcer of other part of right foot with fat layer exposed: Secondary | ICD-10-CM

## 2020-05-07 DIAGNOSIS — E1122 Type 2 diabetes mellitus with diabetic chronic kidney disease: Secondary | ICD-10-CM | POA: Diagnosis present

## 2020-05-07 DIAGNOSIS — N182 Chronic kidney disease, stage 2 (mild): Secondary | ICD-10-CM | POA: Diagnosis present

## 2020-05-07 DIAGNOSIS — R809 Proteinuria, unspecified: Secondary | ICD-10-CM | POA: Diagnosis present

## 2020-05-07 DIAGNOSIS — Z20822 Contact with and (suspected) exposure to covid-19: Secondary | ICD-10-CM | POA: Diagnosis present

## 2020-05-07 DIAGNOSIS — Z79899 Other long term (current) drug therapy: Secondary | ICD-10-CM | POA: Diagnosis not present

## 2020-05-07 DIAGNOSIS — I152 Hypertension secondary to endocrine disorders: Secondary | ICD-10-CM | POA: Diagnosis present

## 2020-05-07 DIAGNOSIS — M86171 Other acute osteomyelitis, right ankle and foot: Secondary | ICD-10-CM | POA: Diagnosis present

## 2020-05-07 DIAGNOSIS — M48061 Spinal stenosis, lumbar region without neurogenic claudication: Secondary | ICD-10-CM | POA: Diagnosis present

## 2020-05-07 DIAGNOSIS — R3914 Feeling of incomplete bladder emptying: Secondary | ICD-10-CM | POA: Diagnosis present

## 2020-05-07 DIAGNOSIS — E119 Type 2 diabetes mellitus without complications: Secondary | ICD-10-CM

## 2020-05-07 LAB — CBC
HCT: 34.4 % — ABNORMAL LOW (ref 39.0–52.0)
Hemoglobin: 11.8 g/dL — ABNORMAL LOW (ref 13.0–17.0)
MCH: 31.5 pg (ref 26.0–34.0)
MCHC: 34.3 g/dL (ref 30.0–36.0)
MCV: 91.7 fL (ref 80.0–100.0)
Platelets: 180 10*3/uL (ref 150–400)
RBC: 3.75 MIL/uL — ABNORMAL LOW (ref 4.22–5.81)
RDW: 12.6 % (ref 11.5–15.5)
WBC: 10.9 10*3/uL — ABNORMAL HIGH (ref 4.0–10.5)
nRBC: 0 % (ref 0.0–0.2)

## 2020-05-07 LAB — CBC WITH DIFFERENTIAL/PLATELET
Abs Immature Granulocytes: 0.04 10*3/uL (ref 0.00–0.07)
Basophils Absolute: 0.1 10*3/uL (ref 0.0–0.1)
Basophils Relative: 0 %
Eosinophils Absolute: 0.1 10*3/uL (ref 0.0–0.5)
Eosinophils Relative: 1 %
HCT: 36.3 % — ABNORMAL LOW (ref 39.0–52.0)
Hemoglobin: 12.2 g/dL — ABNORMAL LOW (ref 13.0–17.0)
Immature Granulocytes: 0 %
Lymphocytes Relative: 13 %
Lymphs Abs: 1.7 10*3/uL (ref 0.7–4.0)
MCH: 31 pg (ref 26.0–34.0)
MCHC: 33.6 g/dL (ref 30.0–36.0)
MCV: 92.4 fL (ref 80.0–100.0)
Monocytes Absolute: 0.9 10*3/uL (ref 0.1–1.0)
Monocytes Relative: 7 %
Neutro Abs: 10.6 10*3/uL — ABNORMAL HIGH (ref 1.7–7.7)
Neutrophils Relative %: 79 %
Platelets: 208 10*3/uL (ref 150–400)
RBC: 3.93 MIL/uL — ABNORMAL LOW (ref 4.22–5.81)
RDW: 12.9 % (ref 11.5–15.5)
WBC: 13.5 10*3/uL — ABNORMAL HIGH (ref 4.0–10.5)
nRBC: 0 % (ref 0.0–0.2)

## 2020-05-07 LAB — GLUCOSE, CAPILLARY
Glucose-Capillary: 180 mg/dL — ABNORMAL HIGH (ref 70–99)
Glucose-Capillary: 229 mg/dL — ABNORMAL HIGH (ref 70–99)
Glucose-Capillary: 191 mg/dL — ABNORMAL HIGH (ref 70–99)
Glucose-Capillary: 195 mg/dL — ABNORMAL HIGH (ref 70–99)

## 2020-05-07 LAB — SARS CORONAVIRUS 2 BY RT PCR (HOSPITAL ORDER, PERFORMED IN ~~LOC~~ HOSPITAL LAB): SARS Coronavirus 2: NEGATIVE

## 2020-05-07 LAB — HEMOGLOBIN A1C
Hgb A1c MFr Bld: 6.7 % — ABNORMAL HIGH (ref 4.8–5.6)
Mean Plasma Glucose: 145.59 mg/dL

## 2020-05-07 LAB — C-REACTIVE PROTEIN: CRP: 15.6 mg/dL — ABNORMAL HIGH (ref <1.0)

## 2020-05-07 LAB — HIV ANTIBODY (ROUTINE TESTING W REFLEX): HIV Screen 4th Generation wRfx: NONREACTIVE

## 2020-05-07 LAB — SEDIMENTATION RATE: Sed Rate: 100 mm/hr — ABNORMAL HIGH (ref 0–16)

## 2020-05-07 LAB — PREALBUMIN: Prealbumin: 11.2 mg/dL — ABNORMAL LOW (ref 18–38)

## 2020-05-07 MED ORDER — INSULIN ASPART 100 UNIT/ML ~~LOC~~ SOLN
0.0000 [IU] | Freq: Three times a day (TID) | SUBCUTANEOUS | Status: DC
  Administered 2020-05-07 (×2): 3 [IU] via SUBCUTANEOUS
  Administered 2020-05-07 – 2020-05-08 (×2): 5 [IU] via SUBCUTANEOUS
  Administered 2020-05-08: 8 [IU] via SUBCUTANEOUS
  Administered 2020-05-08: 5 [IU] via SUBCUTANEOUS
  Administered 2020-05-09: 11 [IU] via SUBCUTANEOUS
  Administered 2020-05-09: 3 [IU] via SUBCUTANEOUS

## 2020-05-07 MED ORDER — TAMSULOSIN HCL 0.4 MG PO CAPS
0.4000 mg | ORAL_CAPSULE | Freq: Every day | ORAL | Status: DC
  Administered 2020-05-07 – 2020-05-08 (×2): 0.4 mg via ORAL
  Filled 2020-05-07 (×2): qty 1

## 2020-05-07 MED ORDER — ENOXAPARIN SODIUM 40 MG/0.4ML ~~LOC~~ SOLN
40.0000 mg | SUBCUTANEOUS | Status: DC
  Administered 2020-05-07 – 2020-05-09 (×3): 40 mg via SUBCUTANEOUS
  Filled 2020-05-07 (×3): qty 0.4

## 2020-05-07 MED ORDER — COLLAGENASE 250 UNIT/GM EX OINT
TOPICAL_OINTMENT | Freq: Every day | CUTANEOUS | Status: DC
  Filled 2020-05-07: qty 30

## 2020-05-07 MED ORDER — SODIUM CHLORIDE 0.9 % IV SOLN: INTRAVENOUS | Status: DC

## 2020-05-07 MED ORDER — VITAMIN D 25 MCG (1000 UNIT) PO TABS
2000.0000 [IU] | ORAL_TABLET | Freq: Every day | ORAL | Status: DC
  Administered 2020-05-07 – 2020-05-09 (×3): 2000 [IU] via ORAL
  Filled 2020-05-07 (×3): qty 2

## 2020-05-07 MED ORDER — FINASTERIDE 5 MG PO TABS
5.0000 mg | ORAL_TABLET | Freq: Every day | ORAL | Status: DC
  Administered 2020-05-07 – 2020-05-09 (×3): 5 mg via ORAL
  Filled 2020-05-07 (×3): qty 1

## 2020-05-07 MED ORDER — VANCOMYCIN HCL 2000 MG/400ML IV SOLN
2000.0000 mg | Freq: Once | INTRAVENOUS | Status: AC
  Administered 2020-05-07: 2000 mg via INTRAVENOUS
  Filled 2020-05-07: qty 400

## 2020-05-07 MED ORDER — SPIRONOLACTONE 25 MG PO TABS
25.0000 mg | ORAL_TABLET | Freq: Every day | ORAL | Status: DC
  Administered 2020-05-07 – 2020-05-09 (×3): 25 mg via ORAL
  Filled 2020-05-07 (×3): qty 1

## 2020-05-07 MED ORDER — ENSURE MAX PROTEIN PO LIQD
11.0000 [oz_av] | Freq: Two times a day (BID) | ORAL | Status: DC
  Administered 2020-05-07 – 2020-05-09 (×4): 11 [oz_av] via ORAL

## 2020-05-07 MED ORDER — ACETAMINOPHEN 650 MG RE SUPP: 650.0000 mg | Freq: Four times a day (QID) | RECTAL | Status: DC | PRN

## 2020-05-07 MED ORDER — ACETAMINOPHEN 325 MG PO TABS: 650.0000 mg | ORAL_TABLET | Freq: Four times a day (QID) | ORAL | Status: DC | PRN

## 2020-05-07 MED ORDER — ADULT MULTIVITAMIN W/MINERALS CH
1.0000 | ORAL_TABLET | Freq: Every day | ORAL | Status: DC
  Administered 2020-05-07 – 2020-05-09 (×3): 1 via ORAL
  Filled 2020-05-07 (×2): qty 1

## 2020-05-07 MED ORDER — INSULIN DEGLUDEC 100 UNIT/ML ~~LOC~~ SOPN: 28.0000 [IU] | PEN_INJECTOR | Freq: Every morning | SUBCUTANEOUS | Status: DC

## 2020-05-07 MED ORDER — JUVEN PO PACK
1.0000 | PACK | Freq: Two times a day (BID) | ORAL | Status: DC
  Administered 2020-05-07 – 2020-05-09 (×4): 1 via ORAL
  Filled 2020-05-07 (×5): qty 1

## 2020-05-07 MED ORDER — ENOXAPARIN SODIUM 40 MG/0.4ML ~~LOC~~ SOLN: 40.0000 mg | SUBCUTANEOUS | Status: DC

## 2020-05-07 MED ORDER — METRONIDAZOLE 500 MG PO TABS
500.0000 mg | ORAL_TABLET | Freq: Three times a day (TID) | ORAL | Status: DC
  Administered 2020-05-07 – 2020-05-09 (×8): 500 mg via ORAL
  Filled 2020-05-07 (×8): qty 1

## 2020-05-07 MED ORDER — VANCOMYCIN HCL 1250 MG/250ML IV SOLN
1250.0000 mg | Freq: Two times a day (BID) | INTRAVENOUS | Status: DC
  Administered 2020-05-07 – 2020-05-09 (×4): 1250 mg via INTRAVENOUS
  Filled 2020-05-07 (×4): qty 250

## 2020-05-07 MED ORDER — CLONIDINE HCL 0.2 MG PO TABS
0.3000 mg | ORAL_TABLET | Freq: Two times a day (BID) | ORAL | Status: DC
  Administered 2020-05-07 (×2): 0.3 mg via ORAL
  Administered 2020-05-08: 0.1 mg via ORAL
  Administered 2020-05-08: 0.3 mg via ORAL
  Administered 2020-05-08: 0.2 mg via ORAL
  Administered 2020-05-09: 0.3 mg via ORAL
  Filled 2020-05-07 (×7): qty 1

## 2020-05-07 MED ORDER — COLLAGENASE 250 UNIT/GM EX OINT
1.0000 "application " | TOPICAL_OINTMENT | Freq: Every day | CUTANEOUS | Status: DC
  Filled 2020-05-07: qty 30

## 2020-05-07 MED ORDER — PROCHLORPERAZINE EDISYLATE 10 MG/2ML IJ SOLN: 5.0000 mg | INTRAMUSCULAR | Status: DC | PRN

## 2020-05-07 MED ORDER — METFORMIN HCL ER 750 MG PO TB24
750.0000 mg | ORAL_TABLET | Freq: Two times a day (BID) | ORAL | Status: DC
  Administered 2020-05-07: 750 mg via ORAL
  Filled 2020-05-07 (×5): qty 1

## 2020-05-07 MED ORDER — GABAPENTIN 100 MG PO CAPS
100.0000 mg | ORAL_CAPSULE | Freq: Three times a day (TID) | ORAL | Status: DC
  Administered 2020-05-07 – 2020-05-09 (×7): 100 mg via ORAL
  Filled 2020-05-07 (×6): qty 1

## 2020-05-07 MED ORDER — AMLODIPINE BESYLATE 5 MG PO TABS
10.0000 mg | ORAL_TABLET | Freq: Every day | ORAL | Status: DC
  Administered 2020-05-07 – 2020-05-09 (×3): 10 mg via ORAL
  Filled 2020-05-07 (×3): qty 2

## 2020-05-07 MED ORDER — SODIUM CHLORIDE 0.9 % IV SOLN
2.0000 g | Freq: Three times a day (TID) | INTRAVENOUS | Status: DC
  Administered 2020-05-07 – 2020-05-09 (×7): 2 g via INTRAVENOUS
  Filled 2020-05-07 (×7): qty 2

## 2020-05-07 MED ORDER — IRBESARTAN 150 MG PO TABS
300.0000 mg | ORAL_TABLET | Freq: Every day | ORAL | Status: DC
  Administered 2020-05-07 – 2020-05-09 (×3): 300 mg via ORAL
  Filled 2020-05-07 (×3): qty 2

## 2020-05-07 MED ORDER — INSULIN GLARGINE 100 UNIT/ML ~~LOC~~ SOLN
28.0000 [IU] | Freq: Every day | SUBCUTANEOUS | Status: DC
  Administered 2020-05-07 – 2020-05-09 (×3): 28 [IU] via SUBCUTANEOUS
  Filled 2020-05-07 (×6): qty 0.28

## 2020-05-07 NOTE — Progress Notes (Signed)
[facility] SERVICE CARE NOTE   05/07/2020 10:49 AM  [name] was seen and examined.  The H&P by the admitting provider, orders, imaging was reviewed.  Please see new orders.  Will continue to follow.  MRI pending. ABI studies pending. I spoke with Dr. Arnoldo Morale with surgery who will consult on patient.    Vitals:   05/07/20 0422 05/07/20 0700  BP: (!) 156/69 (!) 150/66  Pulse: 91 83  Resp: 16 18  Temp: 98.4 F (36.9 C) 98.8 F (37.1 C)  SpO2: 99% 98%    Results for orders placed or performed during the hospital encounter of 05/06/20  Blood culture (routine x 2)   Specimen: Left Antecubital; Blood  Result Value Ref Range   Specimen Description LEFT ANTECUBITAL    Special Requests      BOTTLES DRAWN AEROBIC AND ANAEROBIC Blood Culture adequate volume   Culture      NO GROWTH < 12 HOURS Performed at [facility], [address] [place], [place] [address]    Report Status PENDING   Blood culture (routine x 2)   Specimen: Right Antecubital; Blood  Result Value Ref Range   Specimen Description RIGHT ANTECUBITAL    Special Requests      BOTTLES DRAWN AEROBIC AND ANAEROBIC Blood Culture adequate volume   Culture      NO GROWTH < 12 HOURS Performed at [facility], [address] [place], [place] [address]    Report Status PENDING   SARS Coronavirus 2 by RT PCR (hospital order, performed in [facility] hospital lab) Nasopharyngeal Nasopharyngeal Swab   Specimen: Nasopharyngeal Swab  Result Value Ref Range   SARS Coronavirus 2 NEGATIVE NEGATIVE  CBC with Differential  Result Value Ref Range   WBC 14.3 (H) 4.0 - 10.5 K/uL   RBC 4.22 4.22 - 5.81 MIL/uL   Hemoglobin 13.0 13.0 - 17.0 g/dL   HCT 38.3 (L) 39 - 52 %   MCV 90.8 80.0 - 100.0 fL   MCH 30.8 26.0 - 34.0 pg   MCHC 33.9 30.0 - 36.0 g/dL   RDW 12.6 11.5 - 15.5 %   Platelets 213 150 - 400 K/uL   nRBC 0.0 0.0 - 0.2 %   Neutrophils Relative % 78 %   Neutro Abs 11.1 (H) 1.7 - 7.7 K/uL   Lymphocytes Relative 14 %    Lymphs Abs 1.9 0.7 - 4.0 K/uL   Monocytes Relative 7 %   Monocytes Absolute 1.0 0 - 1 K/uL   Eosinophils Relative 1 %   Eosinophils Absolute 0.1 0 - 0 K/uL   Basophils Relative 0 %   Basophils Absolute 0.1 0 - 0 K/uL   Immature Granulocytes 0 %   Abs Immature Granulocytes 0.04 0.00 - 0.07 K/uL  Basic metabolic panel  Result Value Ref Range   Sodium 133 (L) 135 - 145 mmol/L   Potassium 4.3 3.5 - 5.1 mmol/L   Chloride 98 98 - 111 mmol/L   CO2 24 22 - 32 mmol/L   Glucose, Bld 177 (H) 70 - 99 mg/dL   BUN 17 8 - 23 mg/dL   Creatinine, Ser 1.28 (H) 0.61 - 1.24 mg/dL   Calcium 9.6 8.9 - 10.3 mg/dL   GFR calc non Af Amer 58 (L) >60 mL/min   GFR calc Af Amer >60 >60 mL/min   Anion gap 11 5 - 15  CBC with Differential  Result Value Ref Range   WBC 13.5 (H) 4.0 - 10.5 K/uL  RBC 3.93 (L) 4.22 - 5.81 MIL/uL   Hemoglobin 12.2 (L) 13.0 - 17.0 g/dL   HCT 36.3 (L) 39 - 52 %   MCV 92.4 80.0 - 100.0 fL   MCH 31.0 26.0 - 34.0 pg   MCHC 33.6 30.0 - 36.0 g/dL   RDW 12.9 11.5 - 15.5 %   Platelets 208 150 - 400 K/uL   nRBC 0.0 0.0 - 0.2 %   Neutrophils Relative % 79 %   Neutro Abs 10.6 (H) 1.7 - 7.7 K/uL   Lymphocytes Relative 13 %   Lymphs Abs 1.7 0.7 - 4.0 K/uL   Monocytes Relative 7 %   Monocytes Absolute 0.9 0 - 1 K/uL   Eosinophils Relative 1 %   Eosinophils Absolute 0.1 0 - 0 K/uL   Basophils Relative 0 %   Basophils Absolute 0.1 0 - 0 K/uL   Immature Granulocytes 0 %   Abs Immature Granulocytes 0.04 0.00 - 0.07 K/uL  Hemoglobin A1c  Result Value Ref Range   Hgb A1c MFr Bld 6.7 (H) 4.8 - 5.6 %   Mean Plasma Glucose 145.59 mg/dL  Sedimentation rate  Result Value Ref Range   Sed Rate 100 (H) 0 - 16 mm/hr  C-reactive protein  Result Value Ref Range   CRP 15.6 (H) <1.0 mg/dL  Prealbumin  Result Value Ref Range   Prealbumin 11.2 (L) 18 - 38 mg/dL  Glucose, capillary  Result Value Ref Range   Glucose-Capillary 180 (H) 70 - 99 mg/dL  CBG monitoring, ED  Result Value Ref  Range   Glucose-Capillary 174 (H) 70 - 99 mg/dL   Comment 1 Notify RN    Comment 2 Document in Chart    Time spent 32 mins   Murvin Natal, MD [facility]   05/06/2020  9:49 PM How to contact the [facility] Attending or Consulting provider [place] or covering provider during after hours 7P -7A, for this patient?  1. Check the care team in [facility] and look for a) attending/consulting [facility] provider listed and b) the [facility] team listed 2. Log into [email or web address] and use [place]'s universal password to access. If you do not have the password, please contact the hospital operator. 3. Locate the [facility] provider you are looking for under [facility] and page to a number that you can be directly reached. 4. If you still have difficulty reaching the provider, please page the [facility] (Director on Call) for the Hospitalists listed on amion for assistance.

## 2020-05-07 NOTE — Progress Notes (Signed)
Initial Nutrition Assessment  DOCUMENTATION CODES:   Not applicable  INTERVENTION: Ensure Max po BID, each supplement provides 150 kcal and 30 grams of protein 1 packet Juven BID, each packet provides 95 calories, 2.5 grams of protein (collagen), and 9.8 grams of carbohydrate (3 grams sugar); also contains 7 grams of L-arginine and L-glutamine, 300 mg vitamin C, 15 mg vitamin E, 1.2 mcg vitamin B-12, 9.5 mg zinc, 200 mg calcium, and 1.5 g  Calcium Beta-hydroxy-Beta-methylbutyrate to support wound healing MVI with minerals daily Food preferences updated in [facility]  NUTRITION DIAGNOSIS:   Increased nutrient needs related to wound healing as evidenced by estimated needs.   GOAL:   Patient will meet greater than or equal to 90% of their needs   MONITOR:   PO intake, Supplement acceptance, Weight trends, Labs, Skin  REASON FOR ASSESSMENT:   Consult Wound healing  ASSESSMENT:  64 year old male with past medical history of CKD stage II, proteinuria, DJD of lumbar spine, lumbar stenosis, DM2, HTN, and 2 month history of right great toe ulcer followed by wound clinic s/p oral antibiotics and clinic debridements referred to ED by Dr. Loreta Ave due to further worsening of wound. Patient reports decreased appetite, chills and night sweats, mild headaches, and fatigue over the past few days, admitted for cellulitis of right foot.  Per chart, concerns that patient may have osteomyelitis, further management is pending MRI results.  Met with patient while eating lunch this afternoon, wife in room. Patient having spaghetti and green beans, reports meal taste alright but is barley warm. Patient reports that he does not like tea or diet sodas, requesting milk to drink with meals. Will update [facility] with preferences.   Patient endorses decreased appetite over the past couple of days, states that he did not eat much of anything. He recalls normally having a good appetite with varying po  intake. Patient reports occasionally having breakfast, usually he is out on the tractor and does not eat lunch, reports that he tends to overeat at dinner, recalls pintos, cabbage, and cornbread. RD educated on small frequent meals and snacks throughout the day for better glucose control, suggested drinking nutrition supplement if unable to eat lunch, and discussed importance of adequate nutrition to promote wound healing. Patient amenable to drinking chocolate flavored supplements. Will provide Ensure Max to aid with meeting needs as well as Juven to support wound healing.   Mild pitting RLE edema per RN assessment Patient reports usual wt around 220 lb, endorses ~30 lb wt loss over the past 1-2 years, per recommendation of PCP. He reports cutting back on po intake and gradually losing weight. Per chart, on 12/06/19 he weighed 214.72 lb, on 04/01/20 pt weighed 207.46 lb, noted 202-217 lb from 06/2018-09/2019.  Medications reviewed and include: D3, Catapres, Proscar, Gabapentin, SSI, Lantus, Flagyl IVF: NaCl IVPB: Maxipime, Vancomycin Labs: CBGs 229,180 WBC 10.9 (H) Lab Results  Component Value Date   HGBA1C 6.7 (H) 05/06/2020    NUTRITION - FOCUSED PHYSICAL EXAM: Limited exam due to patient eating lunch, noted mild  orbital depletion, no depletions to buccal, temporal, clavicle, or dorsal hand   Diet Order:   Diet Order            Diet heart healthy/carb modified Room service appropriate? Yes; Fluid consistency: Thin  Diet effective now                 EDUCATION NEEDS:   Education needs have been addressed  Skin:  Skin  Assessment: Skin Integrity Issues: Skin Integrity Issues:: Diabetic Ulcer, Other (Comment) Diabetic Ulcer: R;great toe Other: Cellulitis; right foot  Last BM:  6/22  Height:   Ht Readings from Last 1 Encounters:  05/06/20 6' (1.829 m)    Weight:   Wt Readings from Last 1 Encounters:  05/06/20 93.4 kg    BMI:  Body mass index is 27.94 kg/m.  Estimated  Nutritional Needs:   Kcal:  2400-2600  Protein:  120-130  Fluid:  >/= 2.3 L/day   Lajuan Lines, RD, LDN Clinical Nutrition After Hours/Weekend Pager # in [place]

## 2020-05-07 NOTE — Progress Notes (Signed)
Pharmacy Antibiotic Note  [name] is a 64 y.o. male admitted on 05/06/2020 with RLE infection/diabetic foot wound.  Pharmacy has been consulted for Vancomycin and Cefepime dosing.  Plan: Vancomycin 2000 mg IV now, then Vancomycin 1250 mg IV q12h Cefepime 2 g IV q12h   Height: 6' (182.9 cm) Weight: 93.4 kg (206 lb) IBW/kg (Calculated) : 77.6  Temp (24hrs), Avg:98.6 F (37 C), Min:98.6 F (37 C), Max:98.6 F (37 C)  Recent Labs  Lab 05/06/20 1732  WBC 14.3*  CREATININE 1.28*    Estimated Creatinine Clearance: 67.4 mL/min (A) (by C-G formula based on SCr of 1.28 mg/dL (H)).    No Known Allergies   Caryl Pina 05/07/2020 1:15 AM

## 2020-05-07 NOTE — Consult Note (Signed)
Reason for Consult: Right great toe cellulitis, diabetes mellitus Referring Physician: Dr. Tracey Harries [name] is an 64 y.o. male.  HPI: [name] is a 64 year old white male with a history of diabetes mellitus who has been followed by Dr. Earleen Newport of Podiatry in [place] who was admitted to [facility] for IV antibiotics as there was no bed available at [facility].  [name] has been treated for cellulitis of the right great toe for several months.  There was a concern that he may have osteomyelitis and would require IV antibiotics.  MRI is pending.  He states his right great toe has recently become more swollen with some drainage from the underside of the toenail.  He does have sensation in the right toe.  Past Medical History:  Diagnosis Date  . Chronic kidney disease   . Complication of anesthesia    Hard to wake  . Degenerative joint disease (DJD) of lumbar spine   . Diabetes mellitus without complication ([name])    Type II  . Essential hypertension 02/23/2017  . Hypertension   . Lumbar stenosis   . Proteinuria   . Radicular leg pain     Past Surgical History:  Procedure Laterality Date  . BELPHAROPTOSIS REPAIR    . CARDIAC CATHETERIZATION    . EYE SURGERY    . HEMORRHOID SURGERY    . TRANSFORAMINAL LUMBAR INTERBODY FUSION (TLIF) WITH PEDICLE SCREW FIXATION 2 LEVEL N/A 07/06/2018   Procedure: TRANSFORAMINAL LUMBAR INTERBODY FUSION (TLIF) L4-S1;  Surgeon: Melina Schools, MD;  Location: [facility];  Service: Orthopedics;  Laterality: N/A;    Family History  Problem Relation Age of Onset  . Heart attack Father   . Hypertension Father   . Vision loss Sister        one eye  . Cancer Brother        skin  . Diabetes Brother     Social History:  reports that he has never smoked. He has never used smokeless tobacco. He reports current alcohol use of about 6.0 standard drinks of alcohol per week. He reports that he does not use drugs.  Allergies: No Known  Allergies  Medications: I have reviewed the [name]'s current medications.  Results for orders placed or performed during the hospital encounter of 05/06/20 (from the past 48 hour(s))  CBG monitoring, ED     Status: Abnormal   Collection Time: 05/06/20  5:01 PM  Result Value Ref Range   Glucose-Capillary 174 (H) 70 - 99 mg/dL    Comment: Glucose reference range applies only to samples taken after fasting for at least 8 hours.   Comment 1 Notify RN    Comment 2 Document in Chart   CBC with Differential     Status: Abnormal   Collection Time: 05/06/20  5:32 PM  Result Value Ref Range   WBC 14.3 (H) 4.0 - 10.5 K/uL   RBC 4.22 4.22 - 5.81 MIL/uL   Hemoglobin 13.0 13.0 - 17.0 g/dL   HCT 38.3 (L) 39 - 52 %   MCV 90.8 80.0 - 100.0 fL   MCH 30.8 26.0 - 34.0 pg   MCHC 33.9 30.0 - 36.0 g/dL   RDW 12.6 11.5 - 15.5 %   Platelets 213 150 - 400 K/uL   nRBC 0.0 0.0 - 0.2 %   Neutrophils Relative % 78 %   Neutro Abs 11.1 (H) 1.7 - 7.7 K/uL   Lymphocytes Relative 14 %   Lymphs Abs 1.9 0.7 -  4.0 K/uL   Monocytes Relative 7 %   Monocytes Absolute 1.0 0 - 1 K/uL   Eosinophils Relative 1 %   Eosinophils Absolute 0.1 0 - 0 K/uL   Basophils Relative 0 %   Basophils Absolute 0.1 0 - 0 K/uL   Immature Granulocytes 0 %   Abs Immature Granulocytes 0.04 0.00 - 0.07 K/uL    Comment: Performed at [facility], [address] [place], [place] [address]  Basic metabolic panel     Status: Abnormal   Collection Time: 05/06/20  5:32 PM  Result Value Ref Range   Sodium 133 (L) 135 - 145 mmol/L   Potassium 4.3 3.5 - 5.1 mmol/L   Chloride 98 98 - 111 mmol/L   CO2 24 22 - 32 mmol/L   Glucose, Bld 177 (H) 70 - 99 mg/dL    Comment: Glucose reference range applies only to samples taken after fasting for at least 8 hours.   BUN 17 8 - 23 mg/dL   Creatinine, Ser 1.28 (H) 0.61 - 1.24 mg/dL   Calcium 9.6 8.9 - 10.3 mg/dL   GFR calc non Af Amer 58 (L) >60 mL/min   GFR calc Af Amer >60 >60 mL/min   Anion gap 11  5 - 15    Comment: Performed at [facility], [address] [place], [place] [address]  Blood culture (routine x 2)     Status: None (Preliminary result)   Collection Time: 05/06/20 11:16 PM   Specimen: Left Antecubital; Blood  Result Value Ref Range   Specimen Description LEFT ANTECUBITAL    Special Requests      BOTTLES DRAWN AEROBIC AND ANAEROBIC Blood Culture adequate volume   Culture      NO GROWTH < 12 HOURS Performed at [facility], [address], [place], [place] [address]    Report Status PENDING   CBC with Differential     Status: Abnormal   Collection Time: 05/06/20 11:16 PM  Result Value Ref Range   WBC 13.5 (H) 4.0 - 10.5 K/uL   RBC 3.93 (L) 4.22 - 5.81 MIL/uL   Hemoglobin 12.2 (L) 13.0 - 17.0 g/dL   HCT 36.3 (L) 39 - 52 %   MCV 92.4 80.0 - 100.0 fL   MCH 31.0 26.0 - 34.0 pg   MCHC 33.6 30.0 - 36.0 g/dL   RDW 12.9 11.5 - 15.5 %   Platelets 208 150 - 400 K/uL   nRBC 0.0 0.0 - 0.2 %   Neutrophils Relative % 79 %   Neutro Abs 10.6 (H) 1.7 - 7.7 K/uL   Lymphocytes Relative 13 %   Lymphs Abs 1.7 0.7 - 4.0 K/uL   Monocytes Relative 7 %   Monocytes Absolute 0.9 0 - 1 K/uL   Eosinophils Relative 1 %   Eosinophils Absolute 0.1 0 - 0 K/uL   Basophils Relative 0 %   Basophils Absolute 0.1 0 - 0 K/uL   Immature Granulocytes 0 %   Abs Immature Granulocytes 0.04 0.00 - 0.07 K/uL    Comment: Performed at [facility], [address] [place], [place] [address]  Hemoglobin A1c     Status: Abnormal   Collection Time: 05/06/20 11:16 PM  Result Value Ref Range   Hgb A1c MFr Bld 6.7 (H) 4.8 - 5.6 %    Comment: (NOTE) Pre diabetes:          5.7%-6.4%  Diabetes:              >  6.4%  Glycemic control for   <7.0% adults with diabetes    Mean Plasma Glucose 145.59 mg/dL    Comment: Performed at [facility] [address], [place], [place] [address]  Sedimentation rate     Status: Abnormal   Collection Time: 05/06/20 11:16 PM  Result Value Ref Range   Sed Rate 100  (H) 0 - 16 mm/hr    Comment: Performed at [facility], [address] [place], [place] [address]  C-reactive protein     Status: Abnormal   Collection Time: 05/06/20 11:16 PM  Result Value Ref Range   CRP 15.6 (H) <1.0 mg/dL    Comment: Performed at [facility], [address] [place], [place] [address]  Prealbumin     Status: Abnormal   Collection Time: 05/06/20 11:16 PM  Result Value Ref Range   Prealbumin 11.2 (L) 18 - 38 mg/dL    Comment: Performed at [facility], [place] [address], [place], [place] [address]  HIV Antibody (routine testing w rflx)     Status: None   Collection Time: 05/06/20 11:16 PM  Result Value Ref Range   HIV Screen 4th Generation wRfx Non Reactive Non Reactive    Comment: Performed at [facility], [place] [address] [place], [place] [address]  Blood culture (routine x 2)     Status: None (Preliminary result)   Collection Time: 05/06/20 11:19 PM   Specimen: Right Antecubital; Blood  Result Value Ref Range   Specimen Description RIGHT ANTECUBITAL    Special Requests      BOTTLES DRAWN AEROBIC AND ANAEROBIC Blood Culture adequate volume   Culture      NO GROWTH < 12 HOURS Performed at [facility], [address] [place], [place] [address]    Report Status PENDING   SARS Coronavirus 2 by RT PCR (hospital order, performed in [place] hospital lab) Nasopharyngeal Nasopharyngeal Swab     Status: None   Collection Time: 05/07/20  2:01 AM   Specimen: Nasopharyngeal Swab  Result Value Ref Range   SARS Coronavirus 2 NEGATIVE NEGATIVE    Comment: (NOTE) SARS-CoV-2 target nucleic acids are NOT DETECTED.  The SARS-CoV-2 RNA is generally detectable in upper and lower respiratory specimens during the acute phase of infection. The lowest concentration of SARS-CoV-2 viral copies this assay can detect is 250 copies / mL. A negative result does not preclude SARS-CoV-2 infection and should not be used as the sole basis for treatment or other [name]  management decisions.  A negative result may occur with improper specimen collection / handling, submission of specimen other than nasopharyngeal swab, presence of viral mutation(s) within the areas targeted by this assay, and inadequate number of viral copies (<250 copies / mL). A negative result must be combined with clinical observations, [name] history, and epidemiological information.  Fact Sheet for Patients:   StrictlyIdeas.no  Fact Sheet for Healthcare Providers: BankingDealers.co.za  This test is not yet approved or  cleared by the [place] FDA and has been authorized for detection and/or diagnosis of SARS-CoV-2 by FDA under an Emergency Use Authorization (EUA).  This EUA will remain in effect (meaning this test can be used) for the duration of the COVID-19 declaration under Section 564(b)(1) of the Act, 21 U.S.C. section 360bbb-3(b)(1), unless the authorization is terminated or revoked sooner.  Performed at [facility], [address], [place], [place] [address]   Glucose, capillary     Status: Abnormal   Collection Time:  05/07/20  7:36 AM  Result Value Ref Range   Glucose-Capillary 180 (H) 70 - 99 mg/dL    Comment: Glucose reference range applies only to samples taken after fasting for at least 8 hours.  CBC     Status: Abnormal   Collection Time: 05/07/20 11:20 AM  Result Value Ref Range   WBC 10.9 (H) 4.0 - 10.5 K/uL   RBC 3.75 (L) 4.22 - 5.81 MIL/uL   Hemoglobin 11.8 (L) 13.0 - 17.0 g/dL   HCT 34.4 (L) 39 - 52 %   MCV 91.7 80.0 - 100.0 fL   MCH 31.5 26.0 - 34.0 pg   MCHC 34.3 30.0 - 36.0 g/dL   RDW 12.6 11.5 - 15.5 %   Platelets 180 150 - 400 K/uL   nRBC 0.0 0.0 - 0.2 %    Comment: Performed at [facility], [address], [place], [place] [address]  Glucose, capillary     Status: Abnormal   Collection Time: 05/07/20 11:38 AM  Result Value Ref Range   Glucose-Capillary 229 (H) 70 - 99 mg/dL    Comment:  Glucose reference range applies only to samples taken after fasting for at least 8 hours.    MRI Right foot without contrast  Result Date: 05/07/2020 CLINICAL DATA:  Open wound to right great toe for 3 weeks. Diabetic. EXAM: MRI OF THE RIGHT FOREFOOT WITHOUT CONTRAST TECHNIQUE: Multiplanar, multisequence MR imaging of the right forefoot was performed. No intravenous contrast was administered. COMPARISON:  X-ray 05/07/2020 FINDINGS: Bones/Joint/Cartilage Bone marrow edema with confluent low T1 signal within the distal phalanx of the right great toe compatible with acute osteomyelitis (series 9, image 12). Signal abnormality extends to the base of the distal phalanx. Preservation of the fatty T1 marrow signal within the great toe proximal phalanx. There is a small nonspecific joint effusion at the first metatarsophalangeal joint. Preservation of the bone marrow signal within the remaining visualized forefoot. No acute fracture. No malalignment. Moderate arthropathy of the great toe IP joint. Ligaments Intact Lisfranc ligament. Collateral ligaments of the forefoot appear intact. Muscles and Tendons Mild diffuse edema like signal throughout the intrinsic foot musculature without focal intramuscular fluid collection. Mild fatty infiltration of the foot musculature suggesting chronic denervation changes. No focal tendinous discontinuity. Mild tenosynovial fluid involving the extensor tendon of the great toe. Soft tissues Diffuse soft tissue swelling and edema at the great toe. Small amount of periosteal fluid overlying the distal tuft (series 8, image 6). Otherwise, no well-defined soft tissue fluid collection. IMPRESSION: 1. Acute osteomyelitis of the distal phalanx of the right great toe. 2. Diffuse soft tissue swelling and edema at the great toe. Small amount of fluid overlies the distal tuft. 3. Small nonspecific joint effusion at the first MTP joint, which may be reactive. 4. Mild tenosynovial fluid involving  the extensor tendon of the great toe. 5. Mild diffuse edema-like signal throughout the intrinsic foot musculature suggesting which may represent a nonspecific myositis versus denervation changes. Electronically Signed   By: Davina Poke D.O.   On: 05/07/2020 10:35   US ARTERIAL ABI (SCREENING LOWER EXTREMITY)  Result Date: 05/07/2020 CLINICAL DATA:  Diabetic ulcer of the right first toe. Additional history of hypertension. EXAM: NONINVASIVE PHYSIOLOGIC VASCULAR STUDY OF BILATERAL LOWER EXTREMITIES TECHNIQUE: Evaluation of both lower extremities were performed at rest, including calculation of ankle-brachial indices with single level Doppler, pressure and pulse volume recording. COMPARISON:  None. FINDINGS: Right ABI:  1.15 Left ABI:  1.07 Right Lower Extremity: Posterior  tibial and dorsalis pedis waveforms are biphasic and nearly monophasic. Left Lower Extremity: Posterior tibial waveform is biphasic and dorsalis pedis waveform is monophasic. 1.0-1.4 Normal IMPRESSION: Normal resting ankle-brachial indices bilaterally. Biphasic and monophasic distal waveforms bilaterally are consistent with some component of tibial disease. Electronically Signed   By: Aletta Edouard M.D.   On: 05/07/2020 11:59   DG Foot Complete Right  Result Date: 05/07/2020 CLINICAL DATA:  Great toe swelling and pain. EXAM: RIGHT FOOT COMPLETE - 3+ VIEW COMPARISON:  None. FINDINGS: There is osteolysis at the tip of the great toe with adjacent periosteal new bone formation. Mild soft tissue swelling. Bones are otherwise normal. IMPRESSION: Distal right great toe osteomyelitis. Electronically Signed   By: Ulyses Jarred M.D.   On: 05/07/2020 00:51    ROS:  Pertinent items are noted in HPI.  Blood pressure (!) 150/66, pulse 83, temperature 98.8 F (37.1 C), temperature source Oral, resp. rate 18, height 6' (1.829 m), weight 93.4 kg, SpO2 98 %. Physical Exam: Pleasant white male no acute distress Head is normocephalic,  atraumatic Lungs clear to auscultation with good breath sounds bilaterally Heart examination reveals a regular rate and rhythm Extremity examination reveals palpable pedal pulses in the right foot.  The right great toe is erythematous and mildly swollen.  And eschars place on the plantar surface.  There is also a punctate wound noted laterally in the mid portion of the right great toe.  [name] also has discoloration of the medial aspect of the second toenail.  No draining ulcerations are noted.  Assessment/Plan: Impression: Cellulitis of right great toe, diabetes mellitus.  Possible osteomyelitis. Plan: No need for acute surgical invention at this time.  Further management is pending MRI results.  Aviva Signs 05/07/2020, 12:30 PM

## 2020-05-07 NOTE — H&P (Signed)
History and Physical    [name] VZC:[id number] DOB: 1956/08/14 DOA: 05/06/2020  PCP: [name], DO  Patient coming from: Home.  I have personally briefly reviewed patient's old medical records in [name]  Chief Complaint: Right foot wound.  HPI: [name] is a 64 y.o. male with medical history significant of stage II CKD, proteinuria, history of difficulty waking up from anesthesia, DJD of lumbar spine, radicular leg pain, lumbar stenosis, type 2 diabetes, essential hypertension who has had a right great toe ulcer for the past 2 months and been following at the wound clinic with good progress after taking clindamycin along with wound clinic debridements until this past Friday, when he was put on a second course of oral antibiotics and was scheduled for a follow-up yesterday, but is referred today by Dr. Earleen Newport due to further worsening of the wound.  The patient endorses having chills and night sweats at home.  He states he feels tired and his appetite is decreased.  He also mentions his blood glucose have been higher than usual, despite that he has not been eating much over the past few days.  He also complains of mild frontal headache, fatigue and malaise.  He denies rhinorrhea, sore throat, wheezing, dyspnea or hemoptysis.  No chest pain, palpitations, dizziness, diaphoresis, PND, orthopnea or pitting edema of the lower extremities.  He denies abdominal pain, diarrhea, constipation, melena or hematochezia.  No dysuria, frequency or hematuria.  ED Course: Initial vital signs were temperature 98.6 F, pulse 80, respiration 18, blood pressure 140/69 mmHg and O2 sat 98% on room air.  The patient received 3.375 g of Zosyn IVPB.  Blood cultures x2 were drawn.  His CBC showed a white count of 14.3 with 78% neutrophils, 14% lymphocytes and 7% monocytes.  Hemoglobin was 13.0 g/dL and platelets 213.  BMP shows a sodium of 133 mmol/L.  All other electrolytes are  within normal range.  Glucose 177, BUN 17 creatinine 1.29 mg/dL. Right foot x-rays show distal right great toe osteomyelitis.  Review of Systems: As per HPI otherwise all other systems reviewed and are negative.  Past Medical History:  Diagnosis Date  . Chronic kidney disease   . Complication of anesthesia    Hard to wake  . Degenerative joint disease (DJD) of lumbar spine   . Diabetes mellitus without complication ([place])    Type II  . Essential hypertension 02/23/2017  . Hypertension   . Lumbar stenosis   . Proteinuria   . Radicular leg pain    Past Surgical History:  Procedure Laterality Date  . BELPHAROPTOSIS REPAIR    . CARDIAC CATHETERIZATION    . EYE SURGERY    . HEMORRHOID SURGERY    . TRANSFORAMINAL LUMBAR INTERBODY FUSION (TLIF) WITH PEDICLE SCREW FIXATION 2 LEVEL N/A 07/06/2018   Procedure: TRANSFORAMINAL LUMBAR INTERBODY FUSION (TLIF) L4-S1;  Surgeon: Melina Schools, MD;  Location: [place];  Service: Orthopedics;  Laterality: N/A;   Social History  reports that he has never smoked. He has never used smokeless tobacco. He reports current alcohol use of about 6.0 standard drinks of alcohol per week. He reports that he does not use drugs.  No Known Allergies  Family History  Problem Relation Age of Onset  . Heart attack Father   . Hypertension Father   . Vision loss Sister        one eye  . Cancer Brother        skin  . Diabetes  Brother    Prior to Admission medications   Medication Sig Start Date End Date Taking? Authorizing Provider  amLODipine (NORVASC) 10 MG tablet Take 1 tablet (10 mg total) by mouth daily. 10/01/19  Yes Terald Sleeper, PA-C  cholecalciferol (VITAMIN D3) 25 MCG (1000 UNIT) tablet Take 2,000 Units by mouth daily.   Yes [provider]  cloNIDine (CATAPRES) 0.3 MG tablet Take 1 tablet (0.3 mg total) by mouth 2 (two) times daily. 10/01/19  Yes Terald Sleeper, PA-C  collagenase (SANTYL) ointment Apply 1 application topically daily. Patient  taking differently: Apply 1 application topically daily. Applied to affected toe 04/07/20  Yes Trula Slade, DPM  finasteride (PROSCAR) 5 MG tablet Take 1 tablet (5 mg total) by mouth daily. 05/06/20  Yes Gottschalk, Leatrice Jewels M, DO  gabapentin (NEURONTIN) 100 MG capsule Take 1 capsule (100 mg total) by mouth 3 (three) times daily. 05/02/20  Yes Felipa Furnace, DPM  insulin degludec (TRESIBA FLEXTOUCH) 100 UNIT/ML FlexTouch Pen Inject 0.26-0.6 mLs (26-60 Units total) into the skin daily. Increase by one unit daily until fasting sugar is 150 Patient taking differently: Inject 28 Units into the skin every morning. Increase by one unit daily until fasting sugar is 150 04/18/20  Yes Gottschalk, Ashly M, DO  irbesartan (AVAPRO) 300 MG tablet irbesartan 300 mg tablet Patient taking differently: Take 300 mg by mouth daily.  10/01/19  Yes Terald Sleeper, PA-C  metFORMIN (GLUCOPHAGE-XR) 500 MG 24 hr tablet Take 2 tablets (1,000 mg total) by mouth 2 (two) times daily. TAKE  (1)  TABLET TWICE A DAY. Patient taking differently: Take 750 mg by mouth 2 (two) times daily.  01/01/20  Yes Terald Sleeper, PA-C  spironolactone (ALDACTONE) 25 MG tablet Take 25 mg by mouth daily. 03/26/20  Yes [provider]  tamsulosin (FLOMAX) 0.4 MG CAPS capsule Take 0.4 mg by mouth daily after supper.  03/26/20  Yes [provider]  clindamycin (CLEOCIN) 150 MG capsule Take 300 mg by mouth 3 (three) times daily. [place] ON 04/02/2020--COMPLETED COURSE Patient not taking: Reported on 05/06/2020    [provider]    Physical Exam: Vitals:   05/06/20 1658 05/06/20 1700 05/06/20 2218  BP: (!) 148/69  (!) 144/70  Pulse: 80  87  Resp: 18  18  Temp: 98.6 F (37 C)    TempSrc: Oral    SpO2: 98%  99%  Weight:  93.4 kg   Height:  6' (1.829 m)     Constitutional: NAD, calm, comfortable Eyes: PERRL, lids and conjunctivae normal ENMT: Mucous membranes are moist. Posterior pharynx clear of any  exudate or lesions. Neck: normal, supple, no masses, no thyromegaly Respiratory: clear to auscultation bilaterally, no wheezing, no crackles. Normal respiratory effort. No accessory muscle use.  Cardiovascular: Regular rate and rhythm, no murmurs / rubs / gallops. No extremity edema. 2+ pedal pulses. No carotid bruits.  Abdomen: Obese, nondistended.  BS positive.  Soft, no tenderness, no masses palpated. No hepatosplenomegaly.  Musculoskeletal: no clubbing / cyanosis. No joint deformity upper and lower extremities. Good ROM, no contractures. Normal muscle tone.  Skin: Positive healed right foot first toe ulcer with surrounding erythema, which extends all the way to the mid tibia, edema, calor and TTP. Neurologic: CN 2-12 grossly intact.  Decreased LLE sensation peripherally, DTR normal. Strength 5/5 in all 4.  Psychiatric: Normal judgment and insight. Alert and oriented x 3. Normal mood.   Labs on Admission: I have  personally reviewed following labs and imaging studies  CBC: Recent Labs  Lab 05/06/20 1732  WBC 14.3*  NEUTROABS 11.1*  HGB 13.0  HCT 38.3*  MCV 90.8  PLT 622    Basic Metabolic Panel: Recent Labs  Lab 05/06/20 1732  NA 133*  K 4.3  CL 98  CO2 24  GLUCOSE 177*  BUN 17  CREATININE 1.28*  CALCIUM 9.6    GFR: Estimated Creatinine Clearance: 67.4 mL/min (A) (by C-G formula based on SCr of 1.28 mg/dL (H)).  Liver Function Tests: No results for input(s): AST, ALT, ALKPHOS, BILITOT, PROT, ALBUMIN in the last 168 hours.  Radiological Exams on Admission: DG Foot Complete Right  Result Date: 05/07/2020 CLINICAL DATA:  Great toe swelling and pain. EXAM: RIGHT FOOT COMPLETE - 3+ VIEW COMPARISON:  None. FINDINGS: There is osteolysis at the tip of the great toe with adjacent periosteal new bone formation. Mild soft tissue swelling. Bones are otherwise normal. IMPRESSION: Distal right great toe osteomyelitis. Electronically Signed   By: Ulyses Jarred M.D.   On: 05/07/2020  00:51    EKG: Independently reviewed.   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 Admit to MedSurg/inpatient. Analgesics as needed. Consult wound care. Continue cefepime per pharmacy. Continue vancomycin per pharmacy. Continue metronidazole 500 mg IVPB every 8 hours. Follow blood culture and sensitivity. Check MRI of the right foot to further characterize.  Consider orthopedic surgery or podiatry consult.  Active Problems:   Hypertension associated with diabetes ([place]) Continue olmesartan or formulary equivalent. Continue spironolactone 25 mg p.o. daily. Continue clonidine 0.2 mg p.o. 3 times daily. Continue amlodipine 10 mg p.o. daily    Benign prostatic hyperplasia with incomplete bladder emptying Continue tamsulosin and finasteride.    Hyperlipidemia associated with type 2 diabetes mellitus [facility]) Not on medical therapy. Follow-up with PCP.    Type 2 diabetes mellitus ([facility]) Carbohydrate modified diet. Continue Tresiba 28 units SQ daily. Check hemoglobin A1c per CBG monitoring with [facility].    OSA on CPAP Continue CPAP at bedtime.     DVT prophylaxis: Lovenox SQ. Code Status:   Full code. Family Communication: Disposition Plan:   Patient is from:  Home.  Anticipated DC to:  Home.  Anticipated DC date:  05/09/2020.  Anticipated DC barriers: Clinical improvement. Consults called:  TOC 18 and wound care. Admission status:  Inpatient/MedSurg.  Severity of Illness:  High.  Reubin Milan MD [facility]  How to contact the [facility] Attending or Consulting provider [place] or covering provider during after hours [place], for this patient?   1. Check the care team in [facility] and look for a) attending/consulting [facility] provider listed and b) the [facility] team listed 2. Log into [email or web address] and use [place]'s universal password to access. If you  do not have the password, please contact the hospital operator. 3. Locate the [facility] provider you are looking for under [facility] and page to a number that you can be directly reached. 4. If you still have difficulty reaching the provider, please page the [facility] (Director on Call) for the Hospitalists listed on amion for assistance.  05/07/2020, 1:18 AM   This document was prepared using Dragon voice recognition software and may contain some unintended transcription errors.

## 2020-05-07 NOTE — Consult Note (Signed)
WOC Nurse Consult Note: Reason for Consult:cellulitis to right lower leg and dorsal foot.  Right great plantar toe wound infection Wound type:neuropathic ulcer with cellulitis Pressure Injury POA: NA Measurement: 1 cm x 0.3 cm with periwound callous and fibrin to wound bed Right anterior lower leg is erythematous and right dorsal foot tender to touch and red.  Wound bed:100% fibrin to wound bed and callous to periwound Drainage (amount, consistency, odor) minimal serosanguinous  No odor.  Periwound:callous present circumferentially.  Dressing procedure/placement/frequency:Cleanse right great plantar toe wound with NS and pat dry.  Apply santyl to wound bed to soften fibrin slough.  Cover with NS moist 2x2 and secure with dry dressing and tape.  Change daily.  Right lower leg leave open to air.  If weeping, may add Xeroform gauze and kerlix daily.  Will not follow at this time.  Please re-consult if needed.  Domenic Moras MSN, RN, FNP-BC [facility]ON Wound, Ostomy, Continence Nurse Pager [phone]

## 2020-05-08 DIAGNOSIS — L03031 Cellulitis of right toe: Secondary | ICD-10-CM

## 2020-05-08 LAB — COMPREHENSIVE METABOLIC PANEL
ALT: 8 U/L (ref 0–44)
AST: 9 U/L — ABNORMAL LOW (ref 15–41)
Albumin: 3 g/dL — ABNORMAL LOW (ref 3.5–5.0)
Alkaline Phosphatase: 67 U/L (ref 38–126)
Anion gap: 10 (ref 5–15)
BUN: 22 mg/dL (ref 8–23)
CO2: 24 mmol/L (ref 22–32)
Calcium: 9.2 mg/dL (ref 8.9–10.3)
Chloride: 100 mmol/L (ref 98–111)
Creatinine, Ser: 1.25 mg/dL — ABNORMAL HIGH (ref 0.61–1.24)
GFR calc Af Amer: >60 mL/min (ref >60)
GFR calc non Af Amer: 60 mL/min — ABNORMAL LOW (ref >60)
Glucose, Bld: 231 mg/dL — ABNORMAL HIGH (ref 70–99)
Potassium: 4.4 mmol/L (ref 3.5–5.1)
Sodium: 134 mmol/L — ABNORMAL LOW (ref 135–145)
Total Bilirubin: 0.8 mg/dL (ref 0.3–1.2)
Total Protein: 6.4 g/dL — ABNORMAL LOW (ref 6.5–8.1)

## 2020-05-08 LAB — CBC
HCT: 32.5 % — ABNORMAL LOW (ref 39.0–52.0)
Hemoglobin: 11 g/dL — ABNORMAL LOW (ref 13.0–17.0)
MCH: 31.3 pg (ref 26.0–34.0)
MCHC: 33.8 g/dL (ref 30.0–36.0)
MCV: 92.6 fL (ref 80.0–100.0)
Platelets: 211 10*3/uL (ref 150–400)
RBC: 3.51 MIL/uL — ABNORMAL LOW (ref 4.22–5.81)
RDW: 12.5 % (ref 11.5–15.5)
WBC: 10.2 10*3/uL (ref 4.0–10.5)
nRBC: 0 % (ref 0.0–0.2)

## 2020-05-08 LAB — C-REACTIVE PROTEIN: CRP: 10.7 mg/dL — ABNORMAL HIGH (ref <1.0)

## 2020-05-08 LAB — GLUCOSE, CAPILLARY
Glucose-Capillary: 213 mg/dL — ABNORMAL HIGH (ref 70–99)
Glucose-Capillary: 236 mg/dL — ABNORMAL HIGH (ref 70–99)
Glucose-Capillary: 286 mg/dL — ABNORMAL HIGH (ref 70–99)
Glucose-Capillary: 185 mg/dL — ABNORMAL HIGH (ref 70–99)

## 2020-05-08 MED ORDER — CHLORHEXIDINE GLUCONATE CLOTH 2 % EX PADS
6.0000 | MEDICATED_PAD | Freq: Every day | CUTANEOUS | Status: DC
  Administered 2020-05-08 – 2020-05-09 (×2): 6 via TOPICAL

## 2020-05-08 MED ORDER — SODIUM CHLORIDE 0.9% FLUSH: 10.0000 mL | INTRAVENOUS | Status: DC | PRN

## 2020-05-08 MED ORDER — SODIUM CHLORIDE 0.9% FLUSH
10.0000 mL | Freq: Two times a day (BID) | INTRAVENOUS | Status: DC
  Administered 2020-05-08 – 2020-05-09 (×3): 10 mL

## 2020-05-08 NOTE — Progress Notes (Signed)
VAST RN in to speak with patient regarding PICC line order for home IV antibiotic therapy. RN spoke with patient in detail about PICC line, what it is, why it is used, how the insertion procedure happens, benefits of having line, possible risks, and what to expect going home with PICC line intact. Patient apprehensive of procedure and if this is the route he would like to go. RN answered any questions he presented. Patient remains apprehensive and would like to think on it overnight and make a decision tomorrow morning. VAST RN will call and speak with primary RN in the morning to follow up.

## 2020-05-08 NOTE — Plan of Care (Signed)
  Problem: Clinical Measurements: Goal: Ability to maintain clinical measurements within normal limits will improve Outcome: Progressing   Problem: Nutrition: Goal: Adequate nutrition will be maintained Outcome: Progressing   Problem: Coping: Goal: Level of anxiety will decrease Outcome: Progressing   

## 2020-05-08 NOTE — Progress Notes (Signed)
PROGRESS NOTE   [name]  CZY:[id number] DOB: 1956/09/22 DOA: 05/06/2020 PCP: [name], DO   Chief Complaint  Patient presents with  . Wound Check    Brief Admission History:   64 y.o. male with medical history significant of stage II CKD, proteinuria, history of difficulty waking up from anesthesia, DJD of lumbar spine, radicular leg pain, lumbar stenosis, type 2 diabetes, essential hypertension who has had a right great toe ulcer for the past 2 months and been following at the wound clinic with good progress after taking clindamycin along with wound clinic debridements until this past Friday, when he was put on a second course of oral antibiotics and was scheduled for a follow-up yesterday, but is referred today by Dr. Earleen Newport due to further worsening of the wound.  Assessment & Plan:   Principal Problem:   Cellulitis of right foot Active Problems:   Hypertension associated with diabetes ([place])   Benign prostatic hyperplasia with incomplete bladder emptying   Hyperlipidemia associated with type 2 diabetes mellitus (HCC)   OSA on CPAP   Diabetic ulcer of toe of right foot associated with type 2 diabetes mellitus, with fat layer exposed ([place])   Type 2 diabetes mellitus (HCC)   Cellulitis of right lower extremity   Diabetic infection of right foot (HCC)   Cellulitis of great toe, right  1. Osteomyelitis right 1st toe with cellulitis - continue IV antibiotics as ordered. Pt has been counseled and he has decided to have PICC line placed and complete 6 weeks of IV antibiotics and close follow up with his podiatrist Dr. Earleen Newport. ABI with findings of good LE blood flow.  2. OSA on CPAP. 3. HTN - stable on home meds.  4. BPH - stable on home meds.  5. Type 2 DM - CBG monitoring and SSI coverage ordered.   DVT prophylaxis:  Code Status:  Family Communication:  Disposition:   Status is: Inpatient  Remains inpatient appropriate because:IV treatments appropriate due  to intensity of illness or inability to take [place] 6/25 with home IV antibiotics.   Dispo: The patient is from: Home              Anticipated d/c is to: Home              Anticipated d/c date is: 1 day              Patient currently is not medically stable to d/c.  Consultants:   Surgery   Procedures:   PICC line placement 6/24  Antimicrobials:  Cefepime/vancomcyin   Subjective: Pt reports feeling some pain and pressure in toe but otherwise has been ambulating   Objective: Vitals:   05/07/20 1954 05/07/20 2125 05/08/20 0529 05/08/20 1325  BP:  137/64 (!) 142/66 126/61  Pulse:  83 72 68  Resp:  18 17 18   Temp:  99.1 F (37.3 C) 99.1 F (37.3 C) 98.7 F (37.1 C)  TempSrc:  Oral Oral Oral  SpO2: 97% 98% 99% 99%  Weight:      Height:        Intake/Output Summary (Last 24 hours) at 05/08/2020 1550 Last data filed at 05/08/2020 1300 Gross per 24 hour  Intake 2251.77 ml  Output --  Net 2251.77 ml   Filed Weights   05/06/20 1700  Weight: 93.4 kg    Examination:  General exam: Appears calm and comfortable  Respiratory system: Clear to auscultation.  Respiratory effort normal. Cardiovascular system: S1 & S2 heard, RRR. No JVD, murmurs, rubs, gallops or clicks. No pedal edema. Gastrointestinal system: Abdomen is nondistended, soft and nontender. No organomegaly or masses felt. Normal bowel sounds heard. Central nervous system: Alert and oriented. No focal neurological deficits. Extremities: swollen red right 1st toe Skin: No rashes, lesions or ulcers Psychiatry: Judgement and insight appear normal. Mood & affect appropriate.   Data Reviewed: I have personally reviewed following labs and imaging studies  CBC: Recent Labs  Lab 05/06/20 1732 05/06/20 2316 05/07/20 1120 05/08/20 0442  WBC 14.3* 13.5* 10.9* 10.2  NEUTROABS 11.1* 10.6*  --   --   HGB 13.0 12.2* 11.8* 11.0*  HCT 38.3* 36.3* 34.4* 32.5*  MCV 90.8 92.4 91.7  92.6  PLT 213 208 180 202    Basic Metabolic Panel: Recent Labs  Lab 05/06/20 1732 05/08/20 0442  NA 133* 134*  K 4.3 4.4  CL 98 100  CO2 24 24  GLUCOSE 177* 231*  BUN 17 22  CREATININE 1.28* 1.25*  CALCIUM 9.6 9.2    GFR: Estimated Creatinine Clearance: 69 mL/min (A) (by C-G formula based on SCr of 1.25 mg/dL (H)).  Liver Function Tests: Recent Labs  Lab 05/08/20 0442  AST 9*  ALT 8  ALKPHOS 67  BILITOT 0.8  PROT 6.4*  ALBUMIN 3.0*    CBG: Recent Labs  Lab 05/07/20 1138 05/07/20 1630 05/07/20 2128 05/08/20 0728 05/08/20 1108  GLUCAP 229* 191* 195* 213* 236*    Recent Results (from the past 240 hour(s))  Blood culture (routine x 2)     Status: None (Preliminary result)   Collection Time: 05/06/20 11:16 PM   Specimen: Left Antecubital; Blood  Result Value Ref Range Status   Specimen Description LEFT ANTECUBITAL  Final   Special Requests   Final    BOTTLES DRAWN AEROBIC AND ANAEROBIC Blood Culture adequate volume   Culture   Final    NO GROWTH 2 DAYS Performed at [facility], [address] [place], [place] [address]    Report Status PENDING  Incomplete  Blood culture (routine x 2)     Status: None (Preliminary result)   Collection Time: 05/06/20 11:19 PM   Specimen: Right Antecubital; Blood  Result Value Ref Range Status   Specimen Description RIGHT ANTECUBITAL  Final   Special Requests   Final    BOTTLES DRAWN AEROBIC AND ANAEROBIC Blood Culture adequate volume   Culture   Final    NO GROWTH 2 DAYS Performed at [facility], [address], [place], [place] [address]    Report Status PENDING  Incomplete  SARS Coronavirus 2 by RT PCR (hospital order, performed in [place] hospital lab) Nasopharyngeal Nasopharyngeal Swab     Status: None   Collection Time: 05/07/20  2:01 AM   Specimen: Nasopharyngeal Swab  Result Value Ref Range Status   SARS Coronavirus 2 NEGATIVE NEGATIVE Final    Comment: (NOTE) SARS-CoV-2 target nucleic acids are NOT  DETECTED.  The SARS-CoV-2 RNA is generally detectable in upper and lower respiratory specimens during the acute phase of infection. The lowest concentration of SARS-CoV-2 viral copies this assay can detect is 250 copies / mL. A negative result does not preclude SARS-CoV-2 infection and should not be used as the sole basis for treatment or other patient management decisions.  A negative result may occur with improper specimen collection / handling, submission of specimen other than nasopharyngeal swab, presence of viral mutation(s) within the areas targeted  by this assay, and inadequate number of viral copies (<250 copies / mL). A negative result must be combined with clinical observations, patient history, and epidemiological information.  Fact Sheet for Patients:   StrictlyIdeas.no  Fact Sheet for Healthcare Providers: BankingDealers.co.za  This test is not yet approved or  cleared by the [place] FDA and has been authorized for detection and/or diagnosis of SARS-CoV-2 by FDA under an Emergency Use Authorization (EUA).  This EUA will remain in effect (meaning this test can be used) for the duration of the COVID-19 declaration under Section 564(b)(1) of the Act, 21 U.S.C. section 360bbb-3(b)(1), unless the authorization is terminated or revoked sooner.  Performed at [facility], [address], [place], [place] [address]      Radiology Studies: MRI Right foot without contrast  Result Date: 05/07/2020 CLINICAL DATA:  Open wound to right great toe for 3 weeks. Diabetic. EXAM: MRI OF THE RIGHT FOREFOOT WITHOUT CONTRAST TECHNIQUE: Multiplanar, multisequence MR imaging of the right forefoot was performed. No intravenous contrast was administered. COMPARISON:  X-ray 05/07/2020 FINDINGS: Bones/Joint/Cartilage Bone marrow edema with confluent low T1 signal within the distal phalanx of the right great toe compatible with acute osteomyelitis  (series 9, image 12). Signal abnormality extends to the base of the distal phalanx. Preservation of the fatty T1 marrow signal within the great toe proximal phalanx. There is a small nonspecific joint effusion at the first metatarsophalangeal joint. Preservation of the bone marrow signal within the remaining visualized forefoot. No acute fracture. No malalignment. Moderate arthropathy of the great toe IP joint. Ligaments Intact Lisfranc ligament. Collateral ligaments of the forefoot appear intact. Muscles and Tendons Mild diffuse edema like signal throughout the intrinsic foot musculature without focal intramuscular fluid collection. Mild fatty infiltration of the foot musculature suggesting chronic denervation changes. No focal tendinous discontinuity. Mild tenosynovial fluid involving the extensor tendon of the great toe. Soft tissues Diffuse soft tissue swelling and edema at the great toe. Small amount of periosteal fluid overlying the distal tuft (series 8, image 6). Otherwise, no well-defined soft tissue fluid collection. IMPRESSION: 1. Acute osteomyelitis of the distal phalanx of the right great toe. 2. Diffuse soft tissue swelling and edema at the great toe. Small amount of fluid overlies the distal tuft. 3. Small nonspecific joint effusion at the first MTP joint, which may be reactive. 4. Mild tenosynovial fluid involving the extensor tendon of the great toe. 5. Mild diffuse edema-like signal throughout the intrinsic foot musculature suggesting which may represent a nonspecific myositis versus denervation changes. Electronically Signed   By: Davina Poke D.O.   On: 05/07/2020 10:35   US ARTERIAL ABI (SCREENING LOWER EXTREMITY)  Result Date: 05/07/2020 CLINICAL DATA:  Diabetic ulcer of the right first toe. Additional history of hypertension. EXAM: NONINVASIVE PHYSIOLOGIC VASCULAR STUDY OF BILATERAL LOWER EXTREMITIES TECHNIQUE: Evaluation of both lower extremities were performed at rest, including  calculation of ankle-brachial indices with single level Doppler, pressure and pulse volume recording. COMPARISON:  None. FINDINGS: Right ABI:  1.15 Left ABI:  1.07 Right Lower Extremity: Posterior tibial and dorsalis pedis waveforms are biphasic and nearly monophasic. Left Lower Extremity: Posterior tibial waveform is biphasic and dorsalis pedis waveform is monophasic. 1.0-1.4 Normal IMPRESSION: Normal resting ankle-brachial indices bilaterally. Biphasic and monophasic distal waveforms bilaterally are consistent with some component of tibial disease. Electronically Signed   By: Aletta Edouard M.D.   On: 05/07/2020 11:59   DG Foot Complete Right  Result Date: 05/07/2020 Please see detailed radiograph report in  office note.  DG Foot Complete Right  Result Date: 05/07/2020 CLINICAL DATA:  Great toe swelling and pain. EXAM: RIGHT FOOT COMPLETE - 3+ VIEW COMPARISON:  None. FINDINGS: There is osteolysis at the tip of the great toe with adjacent periosteal new bone formation. Mild soft tissue swelling. Bones are otherwise normal. IMPRESSION: Distal right great toe osteomyelitis. Electronically Signed   By: Ulyses Jarred M.D.   On: 05/07/2020 00:51   [place] EKG SITE RITE  Result Date: 05/07/2020 If Site Rite image not attached, placement could not be confirmed due to current cardiac rhythm.  Scheduled Meds: . amLODipine  10 mg Oral Daily  . Chlorhexidine Gluconate Cloth  6 each Topical Daily  . cholecalciferol  2,000 Units Oral Daily  . cloNIDine  0.3 mg Oral BID  . collagenase   Topical Daily  . enoxaparin (LOVENOX) injection  40 mg Subcutaneous Q24H  . finasteride  5 mg Oral Daily  . gabapentin  100 mg Oral TID  . insulin aspart  0-15 Units Subcutaneous TID WC  . insulin glargine  28 Units Subcutaneous Daily  . irbesartan  300 mg Oral Daily  . metroNIDAZOLE  500 mg Oral Q8H  . multivitamin with minerals  1 tablet Oral Daily  . nutrition supplement (JUVEN)  1 packet Oral BID BM  . Ensure Max  Protein  11 oz Oral BID  . sodium chloride flush  10-40 mL Intracatheter Q12H  . spironolactone  25 mg Oral Daily  . tamsulosin  0.4 mg Oral QPC supper   Continuous Infusions: . sodium chloride 50 mL/hr at 05/07/20 2118  . ceFEPime (MAXIPIME) IV 2 g (05/08/20 1526)  . vancomycin 1,250 mg (05/08/20 0604)     LOS: 1 day   Time spent: 45 mins    Shantavia Jha Wynetta Emery, MD How to contact the [facility] Attending or Consulting provider [place] or covering provider during after hours [place], for this patient?  1. Check the care team in [facility] and look for a) attending/consulting TRH provider listed and b) the [facility] team listed 2. Log into [email or web address] and use [place]'s universal password to access. If you do not have the password, please contact the hospital operator. 3. Locate the [facility] provider you are looking for under [facility] and page to a number that you can be directly reached. 4. If you still have difficulty reaching the provider, please page the [facility] (Director on Call) for the Hospitalists listed on amion for assistance.  05/08/2020, 3:50 PM

## 2020-05-08 NOTE — TOC Initial Note (Signed)
Transition of Care [facility]) - Initial/Assessment Note   Patient Details  Name: [name] MRN: [id number] Date of Birth: 12/11/1955  [facility] CM/SW Contact:    Sherie Don, LCSW Phone Number: 05/08/2020, 12:58 PM  Clinical Narrative: Patient is a 64 year old male admitted for cellulitis of right foot. TOC notified patient will discharge home on 6 weeks IV antibiotics. CSW called Pam with [facility] to make IV antibiotics referral. Pam accepted referral and will set up [facility]. CSW spoke with patient to update him and explained patient will receive education on the IV antibiotics with Pam prior to discharge. Patient verbalized understanding.              Expected Discharge Plan: [place] Barriers to Discharge: Continued Medical Work up  Patient Goals and CMS Choice CMS Medicare.gov Compare Post Acute Care list provided to:: Patient Choice offered to / list presented to : Patient  Expected Discharge Plan and Services Expected Discharge Plan: [place] Discharge Planning Services: NA Post Acute Care Choice: [place] arrangements for the past 2 months: [place]: RN [place] Agency: [place] ([place])  Prior Living Arrangements/Services Living arrangements for the past 2 months: Single Family Home Lives with:: Spouse Patient language and need for interpreter reviewed:: Yes Do you feel safe going back to the place where you live?: Yes      Need for Family Participation in Patient Care: No (Comment) Care giver support system in place?: Yes (comment) Criminal Activity/Legal Involvement Pertinent to Current Situation/Hospitalization: No - Comment as needed  Activities of Daily Living Home Assistive Devices/Equipment: Cane (specify quad or straight), CBG Meter ADL Screening (condition at time of admission) Patient's cognitive ability adequate to safely complete daily activities?:  Yes Is the patient deaf or have difficulty hearing?: No Does the patient have difficulty seeing, even when wearing glasses/contacts?: No Does the patient have difficulty concentrating, remembering, or making decisions?: No Patient able to express need for assistance with ADLs?: Yes Does the patient have difficulty dressing or bathing?: No Independently performs ADLs?: Yes (appropriate for developmental age) Does the patient have difficulty walking or climbing stairs?: No Weakness of Legs: None Weakness of Arms/Hands: None  Permission Sought/Granted Permission sought to share information with : Facility Art therapist granted to share information with : Yes, Verbal Permission Granted Permission granted to share info w AGENCY: [facility] (Pam)  Emotional Assessment Appearance:: Appears stated age Attitude/Demeanor/Rapport: Engaged Affect (typically observed): Accepting Orientation: : Oriented to Self, Oriented to Place, Oriented to  Time, Oriented to Situation Alcohol / Substance Use: Not Applicable Psych Involvement: No (comment)  Admission diagnosis:  Diabetic foot ulcer ([place]) [H60.737, L97.509] Cellulitis of right foot [L03.115] Cellulitis of right lower extremity [L03.115] Diabetic infection of right foot ([place]) [T06.269, L08.9] Patient Active Problem List   Diagnosis Date Noted  . Cellulitis of right foot 05/07/2020  . Type 2 diabetes mellitus ([place]) 05/07/2020  . Cellulitis of right lower extremity   . Diabetic infection of right foot ([place])   . Cellulitis of great toe, right   . Diabetic ulcer of toe of right foot associated with type 2 diabetes mellitus, with fat layer exposed ([place]) 04/15/2020  . OSA on CPAP 09/01/2018  . S/P lumbar fusion 07/06/2018  . DDD (degenerative disc disease), lumbar 02/28/2018  . Hypertension associated with diabetes ([place]) 02/23/2017  . Benign prostatic hyperplasia with incomplete bladder emptying 02/23/2017  .  Vitamin  D deficiency 02/23/2017  . Hyperlipidemia associated with type 2 diabetes mellitus ([place]) 02/23/2017  . Type 2 diabetes mellitus with stage 2 chronic kidney disease, without long-term current use of insulin ([place]) 02/23/2017   PCP:  [name], DO Pharmacy:   [facility], [place] [address] Phone: [phone] Fax: [phone]  Readmission Risk Interventions No flowsheet data found.

## 2020-05-08 NOTE — Progress Notes (Signed)
Spoke with patient's primary RN. Patient remains reluctant to receive PICC line this morning until he speaks with MD about recent blood flow studies of his foot. After he speaks with MD, he will make final decisions regarding PICC line. PICC RN to be notified by primary RN about his final decision and will attempt to place later in the day/evening if he decides to proceed.

## 2020-05-08 NOTE — Progress Notes (Signed)
Peripherally Inserted Central Catheter Placement  The IV Nurse has discussed with the patient and/or persons authorized to consent for the patient, the purpose of this procedure and the potential benefits and risks involved with this procedure.  The benefits include less needle sticks, lab draws from the catheter, and the patient may be discharged home with the catheter. Risks include, but not limited to, infection, bleeding, blood clot (thrombus formation), and puncture of an artery; nerve damage and irregular heartbeat and possibility to perform a PICC exchange if needed/ordered by physician.  Alternatives to this procedure were also discussed.  Bard Power PICC patient education guide, fact sheet on infection prevention and patient information card has been provided to patient /or left at bedside.    PICC Placement Documentation  PICC Single Lumen 05/08/20 PICC Right Brachial 44 cm 0 cm (Active)  Indication for Insertion or Continuance of Line Home intravenous therapies (PICC only) 05/08/20 1220  Exposed Catheter (cm) 0 cm 05/08/20 1220  Site Assessment Clean;Dry;Intact 05/08/20 1220  Line Status Blood return noted;Flushed;Saline locked 05/08/20 1220  Dressing Type Transparent 05/08/20 1220  Dressing Status Clean;Dry;Intact;Antimicrobial disc in place 05/08/20 1220  Safety Lock Not Applicable 97/84/78 4128  Line Care Connections checked and tightened 05/08/20 1220  Dressing Change Due 05/15/20 05/08/20 1220       Jeanice Lim M 05/08/2020, 12:46 PM

## 2020-05-09 DIAGNOSIS — M869 Osteomyelitis, unspecified: Secondary | ICD-10-CM

## 2020-05-09 LAB — VANCOMYCIN, TROUGH: Vancomycin Tr: 21 ug/mL (ref 15–20)

## 2020-05-09 LAB — GLUCOSE, CAPILLARY
Glucose-Capillary: 168 mg/dL — ABNORMAL HIGH (ref 70–99)
Glucose-Capillary: 335 mg/dL — ABNORMAL HIGH (ref 70–99)

## 2020-05-09 MED ORDER — VANCOMYCIN HCL 1750 MG/350ML IV SOLN
1750.0000 mg | INTRAVENOUS | Status: DC
  Filled 2020-05-09 (×3): qty 350

## 2020-05-09 MED ORDER — IRBESARTAN 300 MG PO TABS: 300.0000 mg | ORAL_TABLET | Freq: Every day | ORAL | Status: DC

## 2020-05-09 MED ORDER — CEFTRIAXONE IV (FOR PTA / DISCHARGE USE ONLY): 2.0000 g | INTRAVENOUS | 0 refills | Status: DC

## 2020-05-09 MED ORDER — VANCOMYCIN IV (FOR PTA / DISCHARGE USE ONLY): 1750.0000 mg | INTRAVENOUS | 0 refills | Status: DC

## 2020-05-09 MED ORDER — ADULT MULTIVITAMIN W/MINERALS CH: 1.0000 | ORAL_TABLET | Freq: Every day | ORAL | Status: DC

## 2020-05-09 MED ORDER — SACCHAROMYCES BOULARDII 250 MG PO CAPS
250.0000 mg | ORAL_CAPSULE | Freq: Two times a day (BID) | ORAL | 1 refills | Status: DC
Start: 2020-05-09 — End: 2020-06-18

## 2020-05-09 MED ORDER — SODIUM CHLORIDE 0.9 % IV SOLN
2.0000 g | INTRAVENOUS | Status: DC
  Administered 2020-05-09: 2 g via INTRAVENOUS
  Filled 2020-05-09: qty 20

## 2020-05-09 MED ORDER — TRESIBA FLEXTOUCH 100 UNIT/ML ~~LOC~~ SOPN: 28.0000 [IU] | PEN_INJECTOR | Freq: Every morning | SUBCUTANEOUS | Status: DC

## 2020-05-09 NOTE — Plan of Care (Signed)

## 2020-05-09 NOTE — Progress Notes (Signed)
PHARMACY CONSULT NOTE FOR:  OUTPATIENT  PARENTERAL ANTIBIOTIC THERAPY (OPAT)  Indication: Osteomyelitis  Regimen: Vancomycin 1750 mg IV every 24 hours. Ceftriaxone 2000 mg IV every 24 hours. End date: 06/17/2020  IV antibiotic discharge orders are pended. To discharging provider:  please sign these orders via discharge navigator,  Select New Orders & click on the button choice - Manage This Unsigned Work.     Thank you for allowing pharmacy to be a part of this patient's care.  Ramond Craver 05/09/2020, 11:07 AM

## 2020-05-09 NOTE — Progress Notes (Signed)
CRITICAL VALUE ALERT  Critical Value:  Vancomycin trough 21  Date & Time Notied:  05/09/2020 1035  Provider Notified: Wynetta Emery  Orders Received/Actions taken: no orders given at this time

## 2020-05-09 NOTE — Plan of Care (Signed)

## 2020-05-09 NOTE — Discharge Summary (Signed)
Physician Discharge Summary  [name] ZOX:[id number] DOB: 1956-07-31 DOA: 05/06/2020  PCP: [name], DO Surgeon: Dr. Andrey Cota  Admit date: 05/06/2020 Discharge date: 05/09/2020  Admitted From:  Home   Disposition: Home   Recommendations for Outpatient Follow-up:  1. Follow up with PCP in 2 weeks 2. Follow up with podiatrist in 1 week  3. Home Health IV antibiotics for 6 weeks. 4. Please DC PICC Line after completion of IV antibiotics  Home Health: IV Antibiotics   Discharge Condition: STABLE   CODE STATUS: FULL    Brief Hospitalization Summary: Please see all hospital notes, images, labs for full details of the hospitalization. ADMISSION HPI: [name] is a 64 y.o. male with medical history significant of stage II CKD, proteinuria, history of difficulty waking up from anesthesia, DJD of lumbar spine, radicular leg pain, lumbar stenosis, type 2 diabetes, essential hypertension who has had a right great toe ulcer for the past 2 months and been following at the wound clinic with good progress after taking clindamycin along with wound clinic debridements until this past Friday, when he was put on a second course of oral antibiotics and was scheduled for a follow-up yesterday, but is referred today by Dr. Earleen Newport due to further worsening of the wound.  The patient endorses having chills and night sweats at home.  He states he feels tired and his appetite is decreased.  He also mentions his blood glucose have been higher than usual, despite that he has not been eating much over the past few days.  He also complains of mild frontal headache, fatigue and malaise.  He denies rhinorrhea, sore throat, wheezing, dyspnea or hemoptysis.  No chest pain, palpitations, dizziness, diaphoresis, PND, orthopnea or pitting edema of the lower extremities.  He denies abdominal pain, diarrhea, constipation, melena or hematochezia.  No dysuria, frequency or hematuria.  ED Course:  Initial vital signs were temperature 98.6 F, pulse 80, respiration 18, blood pressure 140/69 mmHg and O2 sat 98% on room air.  The patient received 3.375 g of Zosyn IVPB.  Blood cultures x2 were drawn.  His CBC showed a white count of 14.3 with 78% neutrophils, 14% lymphocytes and 7% monocytes.  Hemoglobin was 13.0 g/dL and platelets 213.  BMP shows a sodium of 133 mmol/L.  All other electrolytes are within normal range.  Glucose 177, BUN 17 creatinine 1.29 mg/dL. Right foot x-rays show distal right great toe osteomyelitis.  Brief Admission History:  64 y.o.malewith medical history significant ofstage II CKD, proteinuria, history of difficulty waking up from anesthesia, DJD of lumbar spine, radicular leg pain, lumbar stenosis, type 2 diabetes, essential hypertension whohas had a right great toe ulcer for the past 2 months and been following at the wound clinic with good progress after taking clindamycin along with wound clinic debridements until this past Friday, when he was put on a second course of oral antibiotics and was scheduled for a follow-up yesterday, but is referred today by Dr. Earleen Newport due to further worsening of the wound.  Assessment & Plan:   Principal Problem:   Cellulitis of right foot Active Problems:   Hypertension associated with diabetes    Benign prostatic hyperplasia with incomplete bladder emptying   Hyperlipidemia associated with type 2 diabetes mellitus    OSA on CPAP   Diabetic ulcer of toe of right foot associated with type 2 diabetes mellitus, with fat layer exposed    Type 2 diabetes mellitus   Cellulitis of right lower  extremity   Diabetic infection of right foot    Cellulitis of great toe, right  1. Osteomyelitis right 1st toe with cellulitis - continue IV antibiotics as ordered. Pt has been counseled and he has decided to have PICC line placed and complete 6 weeks of IV antibiotics and close follow up with his podiatrist Dr. Earleen Newport. He does not want to  consider surgery at this time.  ABI with findings of good blood flow in both lower extremities.    2. OSA on CPAP. 3. HTN - stable on home meds.  4. BPH - stable on home meds.  5. Type 2 DM - CBG monitoring and SSI coverage ordered. Resume home meds at discharge.     Code Status: Full  Family Communication: wife at bedside updated Disposition:   Discharge Diagnoses:  Principal Problem:   Cellulitis of right foot Active Problems:   Hypertension associated with diabetes ([place])   Benign prostatic hyperplasia with incomplete bladder emptying   Hyperlipidemia associated with type 2 diabetes mellitus (H[facility])   OSA on CPAP   Diabetic ulcer of toe of right foot associated with type 2 diabetes mellitus, with fat layer exposed ([place])   Type 2 diabetes mellitus ([place])   Cellulitis of right lower extremity   Diabetic infection of right foot (H[facility])   Cellulitis of great toe, right   Discharge Instructions: Discharge Instructions    [facility] pharmacist to adjust dose for Vancomycin, Aminoglycosides and other anti-infective therapies as requested by physician.   Complete by: As directed    [facility] to provide Cath Flo '2mg'$    Complete by: As directed    Administer for PICC line occlusion and as ordered by physician for other access device issues.   Anaphylaxis Kit: Provided to treat any anaphylactic reaction to the medication being provided to the patient if First Dose or when requested by physician   Complete by: As directed    Epinephrine '1mg'$ /ml vial / amp: Administer 0.'3mg'$  (0.45m) subcutaneously once for moderate to severe anaphylaxis, nurse to call physician and pharmacy when reaction occurs and call 911 if needed for immediate care   Diphenhydramine '50mg'$ /ml IV vial: Administer 25-'50mg'$  IV/IM PRN for first dose reaction, rash, itching, mild reaction, nurse to call physician and pharmacy when reaction occurs   Sodium Chloride 0.9% NS 5035mIV: Administer if needed for  hypovolemic blood pressure drop or as ordered by physician after call to physician with anaphylactic reaction   Change dressing on IV access line weekly and PRN   Complete by: As directed    Flush IV access with Sodium Chloride 0.9% and Heparin 10 units/ml or 100 units/ml   Complete by: As directed    Home infusion instructions - [facility]   Complete by: As directed    Instructions: Flush IV access with Sodium Chloride 0.9% and Heparin 10units/ml or 100units/ml   Change dressing on IV access line: Weekly and PRN   Instructions Cath Flo '2mg'$ : Administer for PICC Line occlusion and as ordered by physician for other access device   [facility] pharmacist to adjust dose for: Vancomycin, Aminoglycosides and other anti-infective therapies as requested by physician   Method of administration may be changed at the discretion of home infusion pharmacist based upon assessment of the patient and/or caregiver's ability to self-administer the medication ordered   Complete by: As directed      Allergies as of 05/09/2020   No Known Allergies     Medication List  STOP taking these medications   clindamycin 150 MG capsule Commonly known as: CLEOCIN     TAKE these medications   amLODipine 10 MG tablet Commonly known as: NORVASC Take 1 tablet (10 mg total) by mouth daily.   cefTRIAXone  IVPB Commonly known as: ROCEPHIN Inject 2 g into the vein daily. Indication:  Osteomyelitis  First Dose: Yes Last Day of Therapy:  06/17/2020 Labs - Once weekly:  CBC/D and BMP, Labs - Every other week:  ESR and CRP Method of administration: IV Push Method of administration may be changed at the discretion of home infusion pharmacist based upon assessment of the patient and/or caregiver's ability to self-administer the medication ordered. Start taking on: May 10, 2020   cholecalciferol 25 MCG (1000 UNIT) tablet Commonly known as: VITAMIN D3 Take 2,000 Units by mouth daily.    cloNIDine 0.3 MG tablet Commonly known as: CATAPRES Take 1 tablet (0.3 mg total) by mouth 2 (two) times daily.   finasteride 5 MG tablet Commonly known as: PROSCAR Take 1 tablet (5 mg total) by mouth daily.   gabapentin 100 MG capsule Commonly known as: NEURONTIN Take 1 capsule (100 mg total) by mouth 3 (three) times daily.   irbesartan 300 MG tablet Commonly known as: AVAPRO Take 1 tablet (300 mg total) by mouth daily.   metFORMIN 500 MG 24 hr tablet Commonly known as: GLUCOPHAGE-XR Take 2 tablets (1,000 mg total) by mouth 2 (two) times daily. TAKE  (1)  TABLET TWICE A DAY. What changed:   how much to take  additional instructions   multivitamin with minerals Tabs tablet Take 1 tablet by mouth daily. Start taking on: May 10, 2020   saccharomyces boulardii 250 MG capsule Commonly known as: Florastor Take 1 capsule (250 mg total) by mouth 2 (two) times daily.   Santyl ointment Generic drug: collagenase Apply 1 application topically daily. What changed: additional instructions   spironolactone 25 MG tablet Commonly known as: ALDACTONE Take 25 mg by mouth daily.   tamsulosin 0.4 MG Caps capsule Commonly known as: FLOMAX Take 0.4 mg by mouth daily after supper.   [name] FlexTouch 100 UNIT/ML FlexTouch Pen Generic drug: insulin degludec Inject 0.28 mLs (28 Units total) into the skin every morning. Increase by one unit daily until fasting sugar is 150   vancomycin  IVPB Inject 1,750 mg into the vein daily. Indication:  osteomyelitis First Dose: Yes Last Day of Therapy:  06/17/2020 Labs - "Sunday/Monday:  CBC/D, BMP, and vancomycin trough. Labs - Thursday:  BMP and vancomycin trough Labs - Every other week:  ESR and CRP Method of administration:Elastomeric Method of administration may be changed at the discretion of the patient and/or caregiver's ability to self-administer the medication ordered. Start taking on: May 10, 2020            Discharge Care  Instructions  (From admission, onward)         Start     Ordered   05/09/20 0000  Change dressing on IV access line weekly and PRN  (Home infusion instructions - [facility] )        06"$ /25/21 1233          Follow-up Information    [name], DO. Schedule an appointment as soon as possible for a visit in 2 week(s).   Specialty: Family Medicine Contact information: [place] [address] [phone]        Trula Slade, DPM. Schedule an appointment as  soon as possible for a visit in 1 week(s).   Specialty: Podiatry Contact information: [place] [address] [phone]              No Known Allergies Allergies as of 05/09/2020   No Known Allergies     Medication List    STOP taking these medications   clindamycin 150 MG capsule Commonly known as: CLEOCIN     TAKE these medications   amLODipine 10 MG tablet Commonly known as: NORVASC Take 1 tablet (10 mg total) by mouth daily.   cefTRIAXone  IVPB Commonly known as: ROCEPHIN Inject 2 g into the vein daily. Indication:  Osteomyelitis  First Dose: Yes Last Day of Therapy:  06/17/2020 Labs - Once weekly:  CBC/D and BMP, Labs - Every other week:  ESR and CRP Method of administration: IV Push Method of administration may be changed at the discretion of home infusion pharmacist based upon assessment of the patient and/or caregiver's ability to self-administer the medication ordered. Start taking on: May 10, 2020   cholecalciferol 25 MCG (1000 UNIT) tablet Commonly known as: VITAMIN D3 Take 2,000 Units by mouth daily.   cloNIDine 0.3 MG tablet Commonly known as: CATAPRES Take 1 tablet (0.3 mg total) by mouth 2 (two) times daily.   finasteride 5 MG tablet Commonly known as: PROSCAR Take 1 tablet (5 mg total) by mouth daily.   gabapentin 100 MG capsule Commonly known as: NEURONTIN Take 1 capsule (100 mg total) by mouth 3 (three)  times daily.   irbesartan 300 MG tablet Commonly known as: AVAPRO Take 1 tablet (300 mg total) by mouth daily.   metFORMIN 500 MG 24 hr tablet Commonly known as: GLUCOPHAGE-XR Take 2 tablets (1,000 mg total) by mouth 2 (two) times daily. TAKE  (1)  TABLET TWICE A DAY. What changed:   how much to take  additional instructions   multivitamin with minerals Tabs tablet Take 1 tablet by mouth daily. Start taking on: May 10, 2020   saccharomyces boulardii 250 MG capsule Commonly known as: Florastor Take 1 capsule (250 mg total) by mouth 2 (two) times daily.   Santyl ointment Generic drug: collagenase Apply 1 application topically daily. What changed: additional instructions   spironolactone 25 MG tablet Commonly known as: ALDACTONE Take 25 mg by mouth daily.   tamsulosin 0.4 MG Caps capsule Commonly known as: FLOMAX Take 0.4 mg by mouth daily after supper.   [name] FlexTouch 100 UNIT/ML FlexTouch Pen Generic drug: insulin degludec Inject 0.28 mLs (28 Units total) into the skin every morning. Increase by one unit daily until fasting sugar is 150   vancomycin  IVPB Inject 1,750 mg into the vein daily. Indication:  osteomyelitis First Dose: Yes Last Day of Therapy:  06/17/2020 Labs - "Sunday/Monday:  CBC/D, BMP, and vancomycin trough. Labs - Thursday:  BMP and vancomycin trough Labs - Every other week:  ESR and CRP Method of administration:Elastomeric Method of administration may be changed at the discretion of the patient and/or caregiver's ability to self-administer the medication ordered. Start taking on: May 10, 2020            Discharge Care Instructions  (From admission, onward)         Start     Ordered   05/09/20 0000  Change dressing on IV access line weekly and PRN  (Home infusion instructions - [facility] )        06"$ /25/21 1233  Procedures/Studies: MRI Right foot without contrast  Result Date: 05/07/2020 CLINICAL DATA:   Open wound to right great toe for 3 weeks. Diabetic. EXAM: MRI OF THE RIGHT FOREFOOT WITHOUT CONTRAST TECHNIQUE: Multiplanar, multisequence MR imaging of the right forefoot was performed. No intravenous contrast was administered. COMPARISON:  X-ray 05/07/2020 FINDINGS: Bones/Joint/Cartilage Bone marrow edema with confluent low T1 signal within the distal phalanx of the right great toe compatible with acute osteomyelitis (series 9, image 12). Signal abnormality extends to the base of the distal phalanx. Preservation of the fatty T1 marrow signal within the great toe proximal phalanx. There is a small nonspecific joint effusion at the first metatarsophalangeal joint. Preservation of the bone marrow signal within the remaining visualized forefoot. No acute fracture. No malalignment. Moderate arthropathy of the great toe IP joint. Ligaments Intact Lisfranc ligament. Collateral ligaments of the forefoot appear intact. Muscles and Tendons Mild diffuse edema like signal throughout the intrinsic foot musculature without focal intramuscular fluid collection. Mild fatty infiltration of the foot musculature suggesting chronic denervation changes. No focal tendinous discontinuity. Mild tenosynovial fluid involving the extensor tendon of the great toe. Soft tissues Diffuse soft tissue swelling and edema at the great toe. Small amount of periosteal fluid overlying the distal tuft (series 8, image 6). Otherwise, no well-defined soft tissue fluid collection. IMPRESSION: 1. Acute osteomyelitis of the distal phalanx of the right great toe. 2. Diffuse soft tissue swelling and edema at the great toe. Small amount of fluid overlies the distal tuft. 3. Small nonspecific joint effusion at the first MTP joint, which may be reactive. 4. Mild tenosynovial fluid involving the extensor tendon of the great toe. 5. Mild diffuse edema-like signal throughout the intrinsic foot musculature suggesting which may represent a nonspecific myositis  versus denervation changes. Electronically Signed   By: Davina Poke D.O.   On: 05/07/2020 10:35   US ARTERIAL ABI (SCREENING LOWER EXTREMITY)  Result Date: 05/07/2020 CLINICAL DATA:  Diabetic ulcer of the right first toe. Additional history of hypertension. EXAM: NONINVASIVE PHYSIOLOGIC VASCULAR STUDY OF BILATERAL LOWER EXTREMITIES TECHNIQUE: Evaluation of both lower extremities were performed at rest, including calculation of ankle-brachial indices with single level Doppler, pressure and pulse volume recording. COMPARISON:  None. FINDINGS: Right ABI:  1.15 Left ABI:  1.07 Right Lower Extremity: Posterior tibial and dorsalis pedis waveforms are biphasic and nearly monophasic. Left Lower Extremity: Posterior tibial waveform is biphasic and dorsalis pedis waveform is monophasic. 1.0-1.4 Normal IMPRESSION: Normal resting ankle-brachial indices bilaterally. Biphasic and monophasic distal waveforms bilaterally are consistent with some component of tibial disease. Electronically Signed   By: Aletta Edouard M.D.   On: 05/07/2020 11:59   DG Foot Complete Right  Result Date: 05/07/2020 Please see detailed radiograph report in office note.  DG Foot Complete Right  Result Date: 05/07/2020 CLINICAL DATA:  Great toe swelling and pain. EXAM: RIGHT FOOT COMPLETE - 3+ VIEW COMPARISON:  None. FINDINGS: There is osteolysis at the tip of the great toe with adjacent periosteal new bone formation. Mild soft tissue swelling. Bones are otherwise normal. IMPRESSION: Distal right great toe osteomyelitis. Electronically Signed   By: Ulyses Jarred M.D.   On: 05/07/2020 00:51   [place] EKG SITE RITE  Result Date: 05/07/2020 If Site Rite image not attached, placement could not be confirmed due to current cardiac rhythm.    Subjective: Pt reports that he feels better.  He is agreeable to home IV antibiotics for 6 weeks and close follow up with his podiatrist.  Discharge Exam: Vitals:   05/08/20 2121 05/09/20 0502  BP:  (!) 145/67 (!) 144/76  Pulse: 73 71  Resp: 18 18  Temp: 99.4 F (37.4 C) 99 F (37.2 C)  SpO2: 98% 99%   Vitals:   05/08/20 1325 05/08/20 1959 05/08/20 2121 05/09/20 0502  BP: 126/61  (!) 145/67 (!) 144/76  Pulse: 68  73 71  Resp: '18  18 18  '$ Temp: 98.7 F (37.1 C)  99.4 F (37.4 C) 99 F (37.2 C)  TempSrc: Oral  Oral Oral  SpO2: 99% 95% 98% 99%  Weight:      Height:       General exam: Appears calm and comfortable  Respiratory system: Clear to auscultation. Respiratory effort normal. Cardiovascular system: S1 & S2 heard, RRR. No JVD, murmurs, rubs, gallops or clicks. No pedal edema. Gastrointestinal system: Abdomen is nondistended, soft and nontender. No organomegaly or masses felt. Normal bowel sounds heard. Central nervous system: Alert and oriented. No focal neurological deficits. Extremities: swollen red right 1st toe Skin: No rashes, lesions or ulcers Psychiatry: Judgement and insight appear normal. Mood & affect appropriate.   The results of significant diagnostics from this hospitalization (including imaging, microbiology, ancillary and laboratory) are listed below for reference.     Microbiology: Recent Results (from the past 240 hour(s))  Blood culture (routine x 2)     Status: None (Preliminary result)   Collection Time: 05/06/20 11:16 PM   Specimen: Left Antecubital; Blood  Result Value Ref Range Status   Specimen Description LEFT ANTECUBITAL  Final   Special Requests   Final    BOTTLES DRAWN AEROBIC AND ANAEROBIC Blood Culture adequate volume   Culture   Final    NO GROWTH 3 DAYS Performed at [facility], [address] [place], [place] [address]    Report Status PENDING  Incomplete  Blood culture (routine x 2)     Status: None (Preliminary result)   Collection Time: 05/06/20 11:19 PM   Specimen: Right Antecubital; Blood  Result Value Ref Range Status   Specimen Description RIGHT ANTECUBITAL  Final   Special Requests   Final    BOTTLES DRAWN AEROBIC  AND ANAEROBIC Blood Culture adequate volume   Culture   Final    NO GROWTH 3 DAYS Performed at [facility], [address], [place], [place] [address]    Report Status PENDING  Incomplete  SARS Coronavirus 2 by RT PCR (hospital order, performed in [facility] hospital lab) Nasopharyngeal Nasopharyngeal Swab     Status: None   Collection Time: 05/07/20  2:01 AM   Specimen: Nasopharyngeal Swab  Result Value Ref Range Status   SARS Coronavirus 2 NEGATIVE NEGATIVE Final    Comment: (NOTE) SARS-CoV-2 target nucleic acids are NOT DETECTED.  The SARS-CoV-2 RNA is generally detectable in upper and lower respiratory specimens during the acute phase of infection. The lowest concentration of SARS-CoV-2 viral copies this assay can detect is 250 copies / mL. A negative result does not preclude SARS-CoV-2 infection and should not be used as the sole basis for treatment or other patient management decisions.  A negative result may occur with improper specimen collection / handling, submission of specimen other than nasopharyngeal swab, presence of viral mutation(s) within the areas targeted by this assay, and inadequate number of viral copies (<250 copies / mL). A negative result must be combined with clinical observations, patient history, and epidemiological information.  Fact Sheet for Patients:   StrictlyIdeas.no  Fact Sheet for Healthcare  Providers: BankingDealers.co.za  This test is not yet approved or  cleared by the [place] and has been authorized for detection and/or diagnosis of SARS-CoV-2 by FDA under an Emergency Use Authorization (EUA).  This EUA will remain in effect (meaning this test can be used) for the duration of the COVID-19 declaration under Section 564(b)(1) of the Act, 21 U.S.C. section 360bbb-3(b)(1), unless the authorization is terminated or revoked sooner.  Performed at [facility], [address]  [place], [place] [address]      Labs: BNP (last 3 results) No results for input(s): BNP in the last 8760 hours. Basic Metabolic Panel: Recent Labs  Lab 05/06/20 1732 05/08/20 0442  NA 133* 134*  K 4.3 4.4  CL 98 100  CO2 24 24  GLUCOSE 177* 231*  BUN 17 22  CREATININE 1.28* 1.25*  CALCIUM 9.6 9.2   Liver Function Tests: Recent Labs  Lab 05/08/20 0442  AST 9*  ALT 8  ALKPHOS 67  BILITOT 0.8  PROT 6.4*  ALBUMIN 3.0*   No results for input(s): LIPASE, AMYLASE in the last 168 hours. No results for input(s): AMMONIA in the last 168 hours. CBC: Recent Labs  Lab 05/06/20 1732 05/06/20 2316 05/07/20 1120 05/08/20 0442  WBC 14.3* 13.5* 10.9* 10.2  NEUTROABS 11.1* 10.6*  --   --   HGB 13.0 12.2* 11.8* 11.0*  HCT 38.3* 36.3* 34.4* 32.5*  MCV 90.8 92.4 91.7 92.6  PLT 213 208 180 211   Cardiac Enzymes: No results for input(s): CKTOTAL, CKMB, CKMBINDEX, TROPONINI in the last 168 hours. BNP: Invalid input(s): POCBNP CBG: Recent Labs  Lab 05/08/20 1108 05/08/20 1552 05/08/20 2256 05/09/20 0744 05/09/20 1157  GLUCAP 236* 286* 185* 168* 335*   D-Dimer No results for input(s): DDIMER in the last 72 hours. Hgb A1c Recent Labs    05/06/20 2316  HGBA1C 6.7*   Lipid Profile No results for input(s): CHOL, HDL, LDLCALC, TRIG, CHOLHDL, LDLDIRECT in the last 72 hours. Thyroid function studies No results for input(s): TSH, T4TOTAL, T3FREE, THYROIDAB in the last 72 hours.  Invalid input(s): FREET3 Anemia work up No results for input(s): VITAMINB12, FOLATE, FERRITIN, TIBC, IRON, RETICCTPCT in the last 72 hours. Urinalysis    Component Value Date/Time   APPEARANCEUR Clear 11/10/2018 1543   GLUCOSEU 2+ (A) 11/10/2018 1543   BILIRUBINUR Negative 11/10/2018 1543   PROTEINUR 1+ (A) 11/10/2018 1543   NITRITE Negative 11/10/2018 1543   LEUKOCYTESUR Negative 11/10/2018 1543   Sepsis Labs Invalid input(s): PROCALCITONIN,  WBC,  LACTICIDVEN Microbiology Recent Results  (from the past 240 hour(s))  Blood culture (routine x 2)     Status: None (Preliminary result)   Collection Time: 05/06/20 11:16 PM   Specimen: Left Antecubital; Blood  Result Value Ref Range Status   Specimen Description LEFT ANTECUBITAL  Final   Special Requests   Final    BOTTLES DRAWN AEROBIC AND ANAEROBIC Blood Culture adequate volume   Culture   Final    NO GROWTH 3 DAYS Performed at [facility], [address] [place], [place] [address]    Report Status PENDING  Incomplete  Blood culture (routine x 2)     Status: None (Preliminary result)   Collection Time: 05/06/20 11:19 PM   Specimen: Right Antecubital; Blood  Result Value Ref Range Status   Specimen Description RIGHT ANTECUBITAL  Final   Special Requests   Final    BOTTLES DRAWN AEROBIC AND ANAEROBIC Blood Culture adequate volume   Culture  Final    NO GROWTH 3 DAYS Performed at [facility], [address] [place], [place] [address]    Report Status PENDING  Incomplete  SARS Coronavirus 2 by RT PCR (hospital order, performed in [facility] hospital lab) Nasopharyngeal Nasopharyngeal Swab     Status: None   Collection Time: 05/07/20  2:01 AM   Specimen: Nasopharyngeal Swab  Result Value Ref Range Status   SARS Coronavirus 2 NEGATIVE NEGATIVE Final    Comment: (NOTE) SARS-CoV-2 target nucleic acids are NOT DETECTED.  The SARS-CoV-2 RNA is generally detectable in upper and lower respiratory specimens during the acute phase of infection. The lowest concentration of SARS-CoV-2 viral copies this assay can detect is 250 copies / mL. A negative result does not preclude SARS-CoV-2 infection and should not be used as the sole basis for treatment or other patient management decisions.  A negative result may occur with improper specimen collection / handling, submission of specimen other than nasopharyngeal swab, presence of viral mutation(s) within the areas targeted by this assay, and inadequate number of viral  copies (<250 copies / mL). A negative result must be combined with clinical observations, patient history, and epidemiological information.  Fact Sheet for Patients:   StrictlyIdeas.no  Fact Sheet for Healthcare Providers: BankingDealers.co.za  This test is not yet approved or  cleared by the [place] FDA and has been authorized for detection and/or diagnosis of SARS-CoV-2 by FDA under an Emergency Use Authorization (EUA).  This EUA will remain in effect (meaning this test can be used) for the duration of the COVID-19 declaration under Section 564(b)(1) of the Act, 21 U.S.C. section 360bbb-3(b)(1), unless the authorization is terminated or revoked sooner.  Performed at [facility], [address], [place], [place] [address]    Time coordinating discharge:   SIGNED:  Irwin Brakeman, MD  [facility] 05/09/2020, 12:43 PM How to contact the [facility] Attending or Consulting provider [place] or covering provider during after hours [place], for this patient?  1. Check the care team in [facility] and look for a) attending/consulting TR[facility] provider listed and b) the [facility] team listed 2. Log into [email or web address] and use [place]'s universal password to access. If you do not have the password, please contact the hospital operator. 3. Locate the [facility] provider you are looking for under [facility] and page to a number that you can be directly reached. 4. If you still have difficulty reaching the provider, please page the [facility] (Director on Call) for the Hospitalists listed on amion for assistance.

## 2020-05-09 NOTE — TOC Transition Note (Signed)
Transition of Care [facility]) - CM/SW Discharge Note   Patient Details  Name: [name] MRN: [id number] Date of Birth: 09-11-1956  Transition of Care [facility]) CM/SW Contact:  Clifford Arnt, LCSW Phone Number: 05/09/2020, 1:30 PM   Clinical Narrative: Pt d/c today. [name] with [facility] has completed teaching with pt and wife for IV antibiotics. LCSW notified [name] with [facility] of discharge today for [facility]. [name] will see pt on Monday. Pt aware.       Final next level of care: Home w Home Health Services Barriers to Discharge: Barriers Resolved   Patient Goals and CMS Choice   CMS Medicare.gov Compare Post Acute Care list provided to:: Patient Choice offered to / list presented to : Patient  Discharge Placement                    Patient and family notified of of transfer: 05/09/20  Discharge Plan and Services   Discharge Planning Services: NA Post Acute Care Choice: Home Health                    HH Arranged: RN [facility] Agency: [place] Date [place]: 05/09/20 Time [place]: [id number] Representative spoke with at [place]: [name] Determinants of Health ([place]) Interventions     Readmission Risk Interventions No flowsheet data found.

## 2020-05-09 NOTE — Care Management Important Message (Signed)
Important Message  Patient Details  Name: [name] MRN: [id number] Date of Birth: 06-Jul-1956   Medicare Important Message Given:  Yes     Tommy Medal 05/09/2020, 2:40 PM

## 2020-05-10 DIAGNOSIS — L03115 Cellulitis of right lower limb: Secondary | ICD-10-CM | POA: Diagnosis not present

## 2020-05-12 ENCOUNTER — Telehealth: Payer: Self-pay | Admitting: Family Medicine

## 2020-05-12 DIAGNOSIS — G4733 Obstructive sleep apnea (adult) (pediatric): Secondary | ICD-10-CM | POA: Diagnosis not present

## 2020-05-12 DIAGNOSIS — Z794 Long term (current) use of insulin: Secondary | ICD-10-CM | POA: Diagnosis not present

## 2020-05-12 DIAGNOSIS — N401 Enlarged prostate with lower urinary tract symptoms: Secondary | ICD-10-CM | POA: Diagnosis not present

## 2020-05-12 DIAGNOSIS — I129 Hypertensive chronic kidney disease with stage 1 through stage 4 chronic kidney disease, or unspecified chronic kidney disease: Secondary | ICD-10-CM | POA: Diagnosis not present

## 2020-05-12 DIAGNOSIS — E1169 Type 2 diabetes mellitus with other specified complication: Secondary | ICD-10-CM | POA: Diagnosis not present

## 2020-05-12 DIAGNOSIS — E785 Hyperlipidemia, unspecified: Secondary | ICD-10-CM | POA: Diagnosis not present

## 2020-05-12 DIAGNOSIS — N138 Other obstructive and reflux uropathy: Secondary | ICD-10-CM | POA: Diagnosis not present

## 2020-05-12 DIAGNOSIS — N182 Chronic kidney disease, stage 2 (mild): Secondary | ICD-10-CM | POA: Diagnosis not present

## 2020-05-12 DIAGNOSIS — Z792 Long term (current) use of antibiotics: Secondary | ICD-10-CM | POA: Diagnosis not present

## 2020-05-12 DIAGNOSIS — L97519 Non-pressure chronic ulcer of other part of right foot with unspecified severity: Secondary | ICD-10-CM | POA: Diagnosis not present

## 2020-05-12 DIAGNOSIS — E11621 Type 2 diabetes mellitus with foot ulcer: Secondary | ICD-10-CM | POA: Diagnosis not present

## 2020-05-12 DIAGNOSIS — M48061 Spinal stenosis, lumbar region without neurogenic claudication: Secondary | ICD-10-CM | POA: Diagnosis not present

## 2020-05-12 DIAGNOSIS — L03115 Cellulitis of right lower limb: Secondary | ICD-10-CM | POA: Diagnosis not present

## 2020-05-12 DIAGNOSIS — N179 Acute kidney failure, unspecified: Secondary | ICD-10-CM | POA: Diagnosis not present

## 2020-05-12 DIAGNOSIS — M47816 Spondylosis without myelopathy or radiculopathy, lumbar region: Secondary | ICD-10-CM | POA: Diagnosis not present

## 2020-05-12 DIAGNOSIS — M86171 Other acute osteomyelitis, right ankle and foot: Secondary | ICD-10-CM | POA: Diagnosis not present

## 2020-05-12 DIAGNOSIS — E1122 Type 2 diabetes mellitus with diabetic chronic kidney disease: Secondary | ICD-10-CM | POA: Diagnosis not present

## 2020-05-12 DIAGNOSIS — Z9181 History of falling: Secondary | ICD-10-CM | POA: Diagnosis not present

## 2020-05-12 DIAGNOSIS — Z452 Encounter for adjustment and management of vascular access device: Secondary | ICD-10-CM | POA: Diagnosis not present

## 2020-05-12 NOTE — Telephone Encounter (Signed)
Spoke to representative at the home health agency.  Patient was discharged on 05/09/2020 on a 6-week course of IV antibiotics via PICC line to be performed at home. Just wanted to make sure that his podiatrist was CCed on these orders as he is the primary person that will be managing the osteomyelitis. Also, please have pharmacy to dose given renal disease.  Lab orders are in but would certainly like them to be involved in the dosing of the medications.  Yaniyah Koors M. Lajuana Ripple, [facility]

## 2020-05-13 ENCOUNTER — Other Ambulatory Visit: Payer: Self-pay

## 2020-05-13 ENCOUNTER — Ambulatory Visit: Payer: PPO | Admitting: Podiatry

## 2020-05-13 ENCOUNTER — Telehealth: Payer: Self-pay | Admitting: Podiatry

## 2020-05-13 VITALS — Temp 97.5°F

## 2020-05-13 DIAGNOSIS — E1149 Type 2 diabetes mellitus with other diabetic neurological complication: Secondary | ICD-10-CM

## 2020-05-13 DIAGNOSIS — M869 Osteomyelitis, unspecified: Secondary | ICD-10-CM | POA: Diagnosis not present

## 2020-05-13 DIAGNOSIS — L03115 Cellulitis of right lower limb: Secondary | ICD-10-CM

## 2020-05-13 LAB — CULTURE, BLOOD (ROUTINE X 2)
Culture: NO GROWTH
Culture: NO GROWTH
Special Requests: ADEQUATE
Special Requests: ADEQUATE

## 2020-05-13 NOTE — Telephone Encounter (Signed)
Inez Catalina from home health called an stated  she did not have any orders  For the dressing change for this pt would like to know what those  orders are. Please assist               Inez Catalina contact # [phone]

## 2020-05-14 ENCOUNTER — Telehealth: Payer: Self-pay | Admitting: Family Medicine

## 2020-05-14 ENCOUNTER — Ambulatory Visit (INDEPENDENT_AMBULATORY_CARE_PROVIDER_SITE_OTHER): Payer: PPO | Admitting: Family Medicine

## 2020-05-14 ENCOUNTER — Other Ambulatory Visit: Payer: Self-pay

## 2020-05-14 VITALS — BP 135/72 | HR 99 | Temp 97.4°F | Ht 72.0 in | Wt 213.0 lb

## 2020-05-14 DIAGNOSIS — Z09 Encounter for follow-up examination after completed treatment for conditions other than malignant neoplasm: Secondary | ICD-10-CM

## 2020-05-14 DIAGNOSIS — M869 Osteomyelitis, unspecified: Secondary | ICD-10-CM

## 2020-05-14 NOTE — Progress Notes (Signed)
Subjective: CC: Hospital follow-up PCP: [name], DO JOI:NOMVEHM [name] is a 64 y.o. male presenting to clinic today for:  1.  Hospital follow-up for osteomyelitis of the right great toe Patient is being very closely followed by podiatry for this.  He unfortunately failed outpatient oral antibiotics and was ultimately admitted to the hospital.  He was discharged with a PICC line to complete a 6-week course of IV antibiotics, cefepime and vancomycin.  He has home health coming in to check his renal function twice per week.  His wife is the one that is administering his antibiotics.his energy is back to baseline.  The nausea that he was experienced has totally resolved.  Appetite is good.  Urine output is good.  Bowel movements are normal.  He is not having any fevers.  He is keeping the area clean with rubbing alcohol and dressing it twice daily.  He lets it "air out" at the end of the day and in the morning he reapplied the dressing.  He notes that the drainage has improved substantially.  Though he does admit he still has quite a deep wound as this was observed by his podiatrist.  He saw his podiatrist yesterday and there was concern about the fact that he has ongoing tenderness to the toes.  There was sharp debridement done of the wound yesterday and dressings were switch to Iodosorb.  Referral was placed to infectious disease.  He will follow-up on the 15th with podiatry again.   ROS: Per HPI  No Known Allergies Past Medical History:  Diagnosis Date  . Chronic kidney disease   . Complication of anesthesia    Hard to wake  . Degenerative joint disease (DJD) of lumbar spine   . 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 Essential hypertension 02/23/2017  . Hypertension   . Lumbar stenosis   . Proteinuria   . Radicular leg pain     Current Outpatient Medications:  .  amLODipine (NORVASC) 10 MG tablet, Take 1 tablet (10 mg total) by mouth daily.,  Disp: 90 tablet, Rfl: 3 .  cefTRIAXone (ROCEPHIN) IVPB, Inject 2 g into the vein daily. Indication:  Osteomyelitis  First Dose: Yes Last Day of Therapy:  06/17/2020 Labs - Once weekly:  CBC/D and BMP, Labs - Every other week:  ESR and CRP Method of administration: IV Push Method of administration may be changed at the discretion of home infusion pharmacist based upon assessment of the patient and/or caregiver's ability to self-administer the medication ordered., Disp: 39 Units, Rfl: 0 .  cholecalciferol (VITAMIN D3) 25 MCG (1000 UNIT) tablet, Take 2,000 Units by mouth daily., Disp: , Rfl:  .  cloNIDine (CATAPRES) 0.3 MG tablet, Take 1 tablet (0.3 mg total) by mouth 2 (two) times daily., Disp: 180 tablet, Rfl: 3 .  collagenase (SANTYL) ointment, Apply 1 application topically daily. (Patient taking differently: Apply 1 application topically daily. Applied to affected toe), Disp: 30 g, Rfl: 0 .  finasteride (PROSCAR) 5 MG tablet, Take 1 tablet (5 mg total) by mouth daily., Disp: 90 tablet, Rfl: 0 .  gabapentin (NEURONTIN) 100 MG capsule, Take 1 capsule (100 mg total) by mouth 3 (three) times daily., Disp: 90 capsule, Rfl: 3 .  insulin degludec (TRESIBA FLEXTOUCH) 100 UNIT/ML FlexTouch Pen, Inject 0.28 mLs (28 Units total) into the skin every morning. Increase by one unit daily until fasting sugar is 150, Disp: , Rfl:  .  irbesartan (AVAPRO) 300 MG  tablet, Take 1 tablet (300 mg total) by mouth daily., Disp: , Rfl:  .  metFORMIN (GLUCOPHAGE-XR) 500 MG 24 hr tablet, Take 2 tablets (1,000 mg total) by mouth 2 (two) times daily. TAKE  (1)  TABLET TWICE A DAY. (Patient taking differently: Take 750 mg by mouth 2 (two) times daily. ), Disp: 360 tablet, Rfl: 1 .  Multiple Vitamin (MULTIVITAMIN WITH MINERALS) TABS tablet, Take 1 tablet by mouth daily., Disp: , Rfl:  .  saccharomyces boulardii (FLORASTOR) 250 MG capsule, Take 1 capsule (250 mg total) by mouth 2 (two) times daily., Disp: 60 capsule, Rfl: 1 .   spironolactone (ALDACTONE) 25 MG tablet, Take 25 mg by mouth daily., Disp: , Rfl:  .  tamsulosin (FLOMAX) 0.4 MG CAPS capsule, Take 0.4 mg by mouth daily after supper. , Disp: , Rfl:  .  vancomycin IVPB, Inject 1,750 mg into the vein daily. Indication:  osteomyelitis First Dose: Yes Last Day of Therapy:  06/17/2020 Labs - Sunday/Monday:  CBC/D, BMP, and vancomycin trough. Labs - Thursday:  BMP and vancomycin trough Labs - Every other week:  ESR and CRP Method of administration:Elastomeric Method of administration may be changed at the discretion of the patient and/or caregiver's ability to self-administer the medication ordered., Disp: 39 Units, Rfl: 0 Social History   Socioeconomic History  . Marital status: Married    Spouse name: Not on file  . Number of children: Not on file  . Years of education: Not on file  . Highest education level: Not on file  Occupational History  . Not on file  Tobacco Use  . Smoking status: Never Smoker  . Smokeless tobacco: Never Used  Vaping Use  . Vaping Use: Never used  Substance and Sexual Activity  . Alcohol use: Yes    Alcohol/week: 6.0 standard drinks    Types: 6 Cans of beer per week  . Drug use: No  . Sexual activity: Yes  Other Topics Concern  . Not on file  Social History Narrative  . Not on file   Social Determinants of Health   Financial Resource Strain:   . Difficulty of Paying Living Expenses:   Food Insecurity:   . Worried About Programme researcher, broadcasting/film/video in the Last Year:   . Barista in the Last Year:   Transportation Needs:   . Freight forwarder (Medical):   Marland Kitchen Lack of Transportation (Non-Medical):   Physical Activity:   . Days of Exercise per Week:   . Minutes of Exercise per Session:   Stress:   . Feeling of Stress :   Social Connections:   . Frequency of Communication with Friends and Family:   . Frequency of Social Gatherings with Friends and Family:   . Attends Religious Services:   . Active Member of Clubs or  Organizations:   . Attends Banker Meetings:   Marland Kitchen Marital Status:   Intimate Partner Violence:   . Fear of Current or Ex-Partner:   . Emotionally Abused:   Marland Kitchen Physically Abused:   . Sexually Abused:    Family History  Problem Relation Age of Onset  . Heart attack Father   . Hypertension Father   . Vision loss Sister        one eye  . Cancer Brother        skin  . Diabetes Brother     Objective: Office vital signs reviewed. BP 135/72   Pulse 99   Temp (!) 97.4  F (36.3 C) (Temporal)   Ht 6' (1.829 m)   Wt 213 lb (96.6 kg)   SpO2 96%   BMI 28.89 kg/m   Physical Examination:  General: Awake, alert, well nourished, No acute distress HEENT: Normal, sclera white, MMM Cardio: regular rate and rhythm, S1S2 heard, no murmurs appreciated Pulm: clear to auscultation bilaterally, no wheezes, rhonchi or rales; normal work of breathing on room air Extremities: warm, no streaking of the right lower extremity noted.  He did not want to undress his wound as he just applied a fresh dressing this morning and his podiatrist just looked at it yesterday.  Assessment/ Plan: 64 y.o. male   1. Osteomyelitis of great toe of right foot [facility]) I reviewed his hospital discharge recommendations.  He will complete a 6-week course of IV antibiotics.  Pharmacy to dose.  Home health orders were signed yesterday and verbal was also given to the home health agency over the phone.  Continue twice weekly laboratory draws given decreased renal function.  I will reach out to his nephrologist to see if he still wants to see him closely or wishes to push that appointment out.  Continue to follow-up with podiatry as scheduled.  I agree and appreciate ID referral by podiatry.  Patient aware of red flag signs and symptoms.  I did advise against regular alcohol use on the toe and recommended soap and water for cleaning.  Also recommended continuing to keep it bandage such that it does not get damaged in his  sleep.  He was good understanding of the plan and will follow up in August for his normal diabetes checkup  2. Hospital discharge follow-up   No orders of the defined types were placed in this encounter.  No orders of the defined types were placed in this encounter.   [name], DO [place] [phone]

## 2020-05-14 NOTE — Progress Notes (Signed)
Subjective: 64 year old male presents the office today for post hospital discharge.  I last saw him for concerns of osteomyelitis, cellulitis.  He was sent to the emergency department and he went to [facility].  MRI was performed which confirmed osteomyelitis.  He was seen by surgery and no acute intervention was performed.  He was discharged home with a PICC line, IV antibiotics for 6 weeks.  PICC line was placed on 6/24 and he is currently on cefepime, vancomycin.  He states he is having some swelling to the foot as well as some redness.  Had some nausea and vomiting that resolved.  Currently denies any fevers or chills.  Objective: AAO x3, NAD DP/PT pulses palpable bilaterally, CRT less than 3 seconds Ulceration the distal aspect hallux which does probe directly down to bone.  There is fibrotic tissue present a small no purulence identified which is superficial and there is no signs of an abscess identified there is swelling to the foot and localized erythema mostly to the hallux.  No pain with calf compression, swelling, warmth, erythema  Assessment: Osteomyelitis   Plan: -All treatment options discussed with the patient including all alternatives, risks, complications.  -At this time we discussed with conservative as well as surgical options including limb salvage versus amputation.  This point he is already on IV antibiotics.  I am very concerned on his toes appropriately tender bone and the infection.  Discussed the need to monitor this very closely and should there be any signs or symptoms of worsening infection is in the emergency department.  Also discussed with amputation of toe with no improvement.  I sharply debrided the wound today to remove some fibrotic tissue present in the wound utilizing the 312 with scalpel.  We will switch to iodosorb dressing changes daily.  Continue elevation and offloading shoe.  We will also refer to infectious disease.  No follow-ups on  file.  Trula Slade DPM  -Patient encouraged to call the office with any questions, concerns, change in symptoms.

## 2020-05-14 NOTE — Telephone Encounter (Signed)
[facility] has some critical labs for this patient.  They are calling to see if you will take responsibility for these so they can fax them over.  Please advise.

## 2020-05-14 NOTE — Telephone Encounter (Signed)
Of course would have been glad to take over these results.  Unfortunately, not routed as an urgent message and I am only now getting to this message.  Yes, PLEASE have them fax over these results and PLEASE give to my back up tomorrow for review.  I attempted to call the agency but there was no answer.  In the future, please make sure urgent matters are flagged as urgent as this causes a delay in care.

## 2020-05-15 ENCOUNTER — Telehealth: Payer: Self-pay | Admitting: *Deleted

## 2020-05-15 ENCOUNTER — Inpatient Hospital Stay (HOSPITAL_COMMUNITY)
Admission: EM | Admit: 2020-05-15 | Discharge: 2020-05-18 | DRG: 617 | Disposition: A | Discharge status: Home / Self Care | Payer: PPO | Attending: Internal Medicine | Admitting: Internal Medicine

## 2020-05-15 ENCOUNTER — Encounter (HOSPITAL_COMMUNITY): Payer: Self-pay

## 2020-05-15 ENCOUNTER — Other Ambulatory Visit: Payer: Self-pay

## 2020-05-15 DIAGNOSIS — M869 Osteomyelitis, unspecified: Secondary | ICD-10-CM | POA: Diagnosis not present

## 2020-05-15 DIAGNOSIS — Z833 Family history of diabetes mellitus: Secondary | ICD-10-CM

## 2020-05-15 DIAGNOSIS — Z821 Family history of blindness and visual loss: Secondary | ICD-10-CM

## 2020-05-15 DIAGNOSIS — M84477A Pathological fracture, right toe(s), initial encounter for fracture: Secondary | ICD-10-CM | POA: Diagnosis not present

## 2020-05-15 DIAGNOSIS — L97519 Non-pressure chronic ulcer of other part of right foot with unspecified severity: Secondary | ICD-10-CM | POA: Diagnosis not present

## 2020-05-15 DIAGNOSIS — I129 Hypertensive chronic kidney disease with stage 1 through stage 4 chronic kidney disease, or unspecified chronic kidney disease: Secondary | ICD-10-CM | POA: Diagnosis not present

## 2020-05-15 DIAGNOSIS — E11621 Type 2 diabetes mellitus with foot ulcer: Secondary | ICD-10-CM | POA: Diagnosis present

## 2020-05-15 DIAGNOSIS — R809 Proteinuria, unspecified: Secondary | ICD-10-CM | POA: Diagnosis present

## 2020-05-15 DIAGNOSIS — M48061 Spinal stenosis, lumbar region without neurogenic claudication: Secondary | ICD-10-CM | POA: Diagnosis not present

## 2020-05-15 DIAGNOSIS — G4733 Obstructive sleep apnea (adult) (pediatric): Secondary | ICD-10-CM | POA: Diagnosis not present

## 2020-05-15 DIAGNOSIS — E1159 Type 2 diabetes mellitus with other circulatory complications: Secondary | ICD-10-CM

## 2020-05-15 DIAGNOSIS — R110 Nausea: Secondary | ICD-10-CM | POA: Diagnosis not present

## 2020-05-15 DIAGNOSIS — Z792 Long term (current) use of antibiotics: Secondary | ICD-10-CM

## 2020-05-15 DIAGNOSIS — N138 Other obstructive and reflux uropathy: Secondary | ICD-10-CM | POA: Diagnosis not present

## 2020-05-15 DIAGNOSIS — M5116 Intervertebral disc disorders with radiculopathy, lumbar region: Secondary | ICD-10-CM | POA: Diagnosis present

## 2020-05-15 DIAGNOSIS — M86171 Other acute osteomyelitis, right ankle and foot: Secondary | ICD-10-CM | POA: Diagnosis present

## 2020-05-15 DIAGNOSIS — I152 Hypertension secondary to endocrine disorders: Secondary | ICD-10-CM | POA: Diagnosis present

## 2020-05-15 DIAGNOSIS — T3695XA Adverse effect of unspecified systemic antibiotic, initial encounter: Secondary | ICD-10-CM | POA: Diagnosis not present

## 2020-05-15 DIAGNOSIS — Z03818 Encounter for observation for suspected exposure to other biological agents ruled out: Secondary | ICD-10-CM | POA: Diagnosis not present

## 2020-05-15 DIAGNOSIS — M879 Osteonecrosis, unspecified: Secondary | ICD-10-CM | POA: Diagnosis present

## 2020-05-15 DIAGNOSIS — N179 Acute kidney failure, unspecified: Secondary | ICD-10-CM | POA: Insufficient documentation

## 2020-05-15 DIAGNOSIS — M5136 Other intervertebral disc degeneration, lumbar region: Secondary | ICD-10-CM | POA: Diagnosis not present

## 2020-05-15 DIAGNOSIS — E08621 Diabetes mellitus due to underlying condition with foot ulcer: Secondary | ICD-10-CM | POA: Diagnosis not present

## 2020-05-15 DIAGNOSIS — Z8249 Family history of ischemic heart disease and other diseases of the circulatory system: Secondary | ICD-10-CM | POA: Diagnosis not present

## 2020-05-15 DIAGNOSIS — E1152 Type 2 diabetes mellitus with diabetic peripheral angiopathy with gangrene: Secondary | ICD-10-CM | POA: Diagnosis not present

## 2020-05-15 DIAGNOSIS — E119 Type 2 diabetes mellitus without complications: Secondary | ICD-10-CM

## 2020-05-15 DIAGNOSIS — I10 Essential (primary) hypertension: Secondary | ICD-10-CM

## 2020-05-15 DIAGNOSIS — D649 Anemia, unspecified: Secondary | ICD-10-CM | POA: Diagnosis not present

## 2020-05-15 DIAGNOSIS — Z794 Long term (current) use of insulin: Secondary | ICD-10-CM | POA: Diagnosis not present

## 2020-05-15 DIAGNOSIS — M4726 Other spondylosis with radiculopathy, lumbar region: Secondary | ICD-10-CM | POA: Diagnosis present

## 2020-05-15 DIAGNOSIS — M7989 Other specified soft tissue disorders: Secondary | ICD-10-CM | POA: Diagnosis not present

## 2020-05-15 DIAGNOSIS — E1169 Type 2 diabetes mellitus with other specified complication: Principal | ICD-10-CM | POA: Diagnosis present

## 2020-05-15 DIAGNOSIS — L97514 Non-pressure chronic ulcer of other part of right foot with necrosis of bone: Secondary | ICD-10-CM

## 2020-05-15 DIAGNOSIS — L03115 Cellulitis of right lower limb: Secondary | ICD-10-CM | POA: Diagnosis not present

## 2020-05-15 DIAGNOSIS — E1122 Type 2 diabetes mellitus with diabetic chronic kidney disease: Secondary | ICD-10-CM | POA: Diagnosis present

## 2020-05-15 DIAGNOSIS — Z9181 History of falling: Secondary | ICD-10-CM | POA: Diagnosis not present

## 2020-05-15 DIAGNOSIS — L97511 Non-pressure chronic ulcer of other part of right foot limited to breakdown of skin: Secondary | ICD-10-CM

## 2020-05-15 DIAGNOSIS — M47816 Spondylosis without myelopathy or radiculopathy, lumbar region: Secondary | ICD-10-CM | POA: Diagnosis not present

## 2020-05-15 DIAGNOSIS — E1149 Type 2 diabetes mellitus with other diabetic neurological complication: Secondary | ICD-10-CM

## 2020-05-15 DIAGNOSIS — Z79899 Other long term (current) drug therapy: Secondary | ICD-10-CM

## 2020-05-15 DIAGNOSIS — Z89411 Acquired absence of right great toe: Secondary | ICD-10-CM | POA: Diagnosis not present

## 2020-05-15 DIAGNOSIS — N400 Enlarged prostate without lower urinary tract symptoms: Secondary | ICD-10-CM | POA: Diagnosis present

## 2020-05-15 DIAGNOSIS — L97509 Non-pressure chronic ulcer of other part of unspecified foot with unspecified severity: Secondary | ICD-10-CM | POA: Diagnosis not present

## 2020-05-15 DIAGNOSIS — Z452 Encounter for adjustment and management of vascular access device: Secondary | ICD-10-CM | POA: Diagnosis not present

## 2020-05-15 DIAGNOSIS — Z9119 Patient's noncompliance with other medical treatment and regimen: Secondary | ICD-10-CM

## 2020-05-15 DIAGNOSIS — Z20822 Contact with and (suspected) exposure to covid-19: Secondary | ICD-10-CM | POA: Diagnosis present

## 2020-05-15 DIAGNOSIS — N182 Chronic kidney disease, stage 2 (mild): Secondary | ICD-10-CM | POA: Diagnosis not present

## 2020-05-15 DIAGNOSIS — S98111A Complete traumatic amputation of right great toe, initial encounter: Secondary | ICD-10-CM | POA: Diagnosis not present

## 2020-05-15 DIAGNOSIS — R600 Localized edema: Secondary | ICD-10-CM | POA: Diagnosis not present

## 2020-05-15 DIAGNOSIS — R197 Diarrhea, unspecified: Secondary | ICD-10-CM | POA: Diagnosis present

## 2020-05-15 DIAGNOSIS — E785 Hyperlipidemia, unspecified: Secondary | ICD-10-CM | POA: Diagnosis not present

## 2020-05-15 DIAGNOSIS — Z9889 Other specified postprocedural states: Secondary | ICD-10-CM

## 2020-05-15 DIAGNOSIS — I739 Peripheral vascular disease, unspecified: Secondary | ICD-10-CM

## 2020-05-15 DIAGNOSIS — N401 Enlarged prostate with lower urinary tract symptoms: Secondary | ICD-10-CM | POA: Diagnosis not present

## 2020-05-15 LAB — LACTIC ACID, PLASMA: Lactic Acid, Venous: 1.1 mmol/L (ref 0.5–1.9)

## 2020-05-15 LAB — COMPREHENSIVE METABOLIC PANEL
ALT: 21 U/L (ref 0–44)
AST: 15 U/L (ref 15–41)
Albumin: 3.5 g/dL (ref 3.5–5.0)
Alkaline Phosphatase: 72 U/L (ref 38–126)
Anion gap: 10 (ref 5–15)
BUN: 30 mg/dL — ABNORMAL HIGH (ref 8–23)
CO2: 22 mmol/L (ref 22–32)
Calcium: 9.3 mg/dL (ref 8.9–10.3)
Chloride: 101 mmol/L (ref 98–111)
Creatinine, Ser: 2.56 mg/dL — ABNORMAL HIGH (ref 0.61–1.24)
GFR calc Af Amer: 29 mL/min — ABNORMAL LOW (ref >60)
GFR calc non Af Amer: 25 mL/min — ABNORMAL LOW (ref >60)
Glucose, Bld: 181 mg/dL — ABNORMAL HIGH (ref 70–99)
Potassium: 4.3 mmol/L (ref 3.5–5.1)
Sodium: 133 mmol/L — ABNORMAL LOW (ref 135–145)
Total Bilirubin: 0.8 mg/dL (ref 0.3–1.2)
Total Protein: 7.2 g/dL (ref 6.5–8.1)

## 2020-05-15 LAB — CBC WITH DIFFERENTIAL/PLATELET
Abs Immature Granulocytes: 0.11 10*3/uL — ABNORMAL HIGH (ref 0.00–0.07)
Basophils Absolute: 0 10*3/uL (ref 0.0–0.1)
Basophils Relative: 0 %
Eosinophils Absolute: 0.3 10*3/uL (ref 0.0–0.5)
Eosinophils Relative: 3 %
HCT: 31.4 % — ABNORMAL LOW (ref 39.0–52.0)
Hemoglobin: 11.1 g/dL — ABNORMAL LOW (ref 13.0–17.0)
Immature Granulocytes: 1 %
Lymphocytes Relative: 19 %
Lymphs Abs: 2.1 10*3/uL (ref 0.7–4.0)
MCH: 31.4 pg (ref 26.0–34.0)
MCHC: 35.4 g/dL (ref 30.0–36.0)
MCV: 88.7 fL (ref 80.0–100.0)
Monocytes Absolute: 0.8 10*3/uL (ref 0.1–1.0)
Monocytes Relative: 7 %
Neutro Abs: 7.6 10*3/uL (ref 1.7–7.7)
Neutrophils Relative %: 70 %
Platelets: 311 10*3/uL (ref 150–400)
RBC: 3.54 MIL/uL — ABNORMAL LOW (ref 4.22–5.81)
RDW: 12.1 % (ref 11.5–15.5)
WBC: 10.8 10*3/uL — ABNORMAL HIGH (ref 4.0–10.5)
nRBC: 0 % (ref 0.0–0.2)

## 2020-05-15 LAB — URINALYSIS, ROUTINE W REFLEX MICROSCOPIC
Bilirubin Urine: NEGATIVE
Glucose, UA: 50 mg/dL — AB
Ketones, ur: NEGATIVE mg/dL
Leukocytes,Ua: NEGATIVE
Nitrite: NEGATIVE
Protein, ur: NEGATIVE mg/dL
Specific Gravity, Urine: 1.005 (ref 1.005–1.030)
pH: 5 (ref 5.0–8.0)

## 2020-05-15 LAB — SARS CORONAVIRUS 2 BY RT PCR (HOSPITAL ORDER, PERFORMED IN ~~LOC~~ HOSPITAL LAB): SARS Coronavirus 2: NEGATIVE

## 2020-05-15 LAB — PROTIME-INR
INR: 1 (ref 0.8–1.2)
Prothrombin Time: 13.2 seconds (ref 11.4–15.2)

## 2020-05-15 LAB — CBG MONITORING, ED: Glucose-Capillary: 114 mg/dL — ABNORMAL HIGH (ref 70–99)

## 2020-05-15 MED ORDER — INSULIN GLARGINE 100 UNIT/ML ~~LOC~~ SOLN
20.0000 [IU] | Freq: Every day | SUBCUTANEOUS | Status: DC
  Administered 2020-05-16 – 2020-05-18 (×3): 20 [IU] via SUBCUTANEOUS
  Filled 2020-05-15 (×3): qty 0.2

## 2020-05-15 MED ORDER — SODIUM CHLORIDE 0.9 % IV BOLUS
1000.0000 mL | Freq: Once | INTRAVENOUS | Status: AC
  Administered 2020-05-15: 1000 mL via INTRAVENOUS

## 2020-05-15 MED ORDER — CADEXOMER IODINE 0.9 % EX GEL: 1.0000 "application " | Freq: Every day | CUTANEOUS | 3 refills | Status: DC | PRN

## 2020-05-15 MED ORDER — PIPERACILLIN-TAZOBACTAM 3.375 G IVPB 30 MIN
3.3750 g | Freq: Once | INTRAVENOUS | Status: AC
  Administered 2020-05-15: 3.375 g via INTRAVENOUS
  Filled 2020-05-15: qty 50

## 2020-05-15 MED ORDER — INSULIN ASPART 100 UNIT/ML ~~LOC~~ SOLN
0.0000 [IU] | Freq: Three times a day (TID) | SUBCUTANEOUS | Status: DC
  Administered 2020-05-16 (×2): 2 [IU] via SUBCUTANEOUS
  Administered 2020-05-16 – 2020-05-17 (×2): 3 [IU] via SUBCUTANEOUS
  Filled 2020-05-15: qty 0.15

## 2020-05-15 MED ORDER — INSULIN ASPART 100 UNIT/ML ~~LOC~~ SOLN
0.0000 [IU] | Freq: Every day | SUBCUTANEOUS | Status: DC
  Filled 2020-05-15: qty 0.05

## 2020-05-15 MED ORDER — SODIUM CHLORIDE 0.9% FLUSH: 3.0000 mL | Freq: Once | INTRAVENOUS | Status: DC

## 2020-05-15 MED ORDER — AMLODIPINE BESYLATE 10 MG PO TABS
10.0000 mg | ORAL_TABLET | Freq: Every day | ORAL | Status: DC
  Administered 2020-05-16: 5 mg via ORAL
  Administered 2020-05-17 – 2020-05-18 (×2): 10 mg via ORAL
  Filled 2020-05-15 (×2): qty 1
  Filled 2020-05-15: qty 2

## 2020-05-15 MED ORDER — SODIUM CHLORIDE 0.9 % IV SOLN: INTRAVENOUS | Status: DC

## 2020-05-15 MED ORDER — HEPARIN SODIUM (PORCINE) 5000 UNIT/ML IJ SOLN
5000.0000 [IU] | Freq: Three times a day (TID) | INTRAMUSCULAR | Status: DC
  Administered 2020-05-15 – 2020-05-18 (×6): 5000 [IU] via SUBCUTANEOUS
  Filled 2020-05-15 (×7): qty 1

## 2020-05-15 MED ORDER — PIPERACILLIN-TAZOBACTAM 3.375 G IVPB
3.3750 g | Freq: Three times a day (TID) | INTRAVENOUS | Status: DC
  Administered 2020-05-16 – 2020-05-18 (×6): 3.375 g via INTRAVENOUS
  Filled 2020-05-15 (×8): qty 50

## 2020-05-15 MED ORDER — FINASTERIDE 5 MG PO TABS
5.0000 mg | ORAL_TABLET | Freq: Every day | ORAL | Status: DC
  Administered 2020-05-16 – 2020-05-18 (×3): 5 mg via ORAL
  Filled 2020-05-15 (×4): qty 1

## 2020-05-15 MED ORDER — CLONIDINE HCL 0.1 MG PO TABS
0.3000 mg | ORAL_TABLET | Freq: Two times a day (BID) | ORAL | Status: DC
  Administered 2020-05-16 – 2020-05-18 (×5): 0.3 mg via ORAL
  Filled 2020-05-15 (×6): qty 3

## 2020-05-15 MED ORDER — TAMSULOSIN HCL 0.4 MG PO CAPS
0.4000 mg | ORAL_CAPSULE | Freq: Every day | ORAL | Status: DC
  Administered 2020-05-16 – 2020-05-17 (×2): 0.4 mg via ORAL
  Filled 2020-05-15 (×2): qty 1

## 2020-05-15 MED ORDER — GABAPENTIN 100 MG PO CAPS
100.0000 mg | ORAL_CAPSULE | Freq: Every day | ORAL | Status: DC
  Administered 2020-05-16 – 2020-05-18 (×3): 100 mg via ORAL
  Filled 2020-05-15 (×3): qty 1

## 2020-05-15 NOTE — Telephone Encounter (Signed)
Will do iodosorb dressing changes for now daily. Thanks.

## 2020-05-15 NOTE — Telephone Encounter (Signed)
Thank you :)

## 2020-05-15 NOTE — Telephone Encounter (Signed)
Left message with Dr. [name] 05/15/2020 9:50am orders and ordered iodosorb.

## 2020-05-15 NOTE — Telephone Encounter (Signed)
[name] from [facility] aware that Dr. Lajuana Ripple will take responsibility

## 2020-05-15 NOTE — Telephone Encounter (Signed)
-----   Message from Trula Slade, DPM sent at 05/14/2020  7:21 AM EDT ----- Can you please refer to infectious disease for osteomyelitis? He already has  PICC line from the hospital. If they ask please let them know I want him to follow up with them in order to help manage the antibiotics.   Thank you.

## 2020-05-15 NOTE — ED Triage Notes (Signed)
Patient c/o right foot swelling. Patient has been receiving antibiotics via a right upper arm PICC line. Patient states he was called and told to stop the antibiotics because if was affecting his kidney function. Patient states today, his foot started swelling. Patient states he was told he had a "bone infection."

## 2020-05-15 NOTE — Addendum Note (Signed)
Addended by: Harriett Sine D on: 05/15/2020 05:19 PM   Modules accepted: Orders

## 2020-05-15 NOTE — H&P (Signed)
History and Physical    [name] ATF:[id number] DOB: Jul 22, 1956 DOA: 05/15/2020  PCP: [name], DO  Patient coming from: Home  I have personally briefly reviewed patient's old medical records in [place]  Chief Complaint: Worsening of right great toe edema, erythema and worsening renal function  HPI: [name] is a 64 y.o. male with medical history significant for stage II CKD, proteinuria, history of difficulty waking up from anesthesia, DJD of lumbar spine, radicular leg pain, lumbar stenosis, insulin-dependent type 2 diabetes, essential hypertension, and OSA noncompliant with CPAP who presents with worsening edema, erythema of his right great toe.  Patient has had cellulitis of the right great toe followed by Dr. [name] of podiatry in [facility] in [place] and ultimately required hospitalization at [facility] in June for osteomyelitis of the right great toe.  He was sent home with a PICC line with plans to complete 6 weeks of IV vancomycin and Rocephin (05/10/20-06/17/20). However antibiotics was discontinued yesterday due to worsening renal function seen on routine lab work. Since discontinuation of his antibiotics, he has noticed worsening swelling and edema of the right toe.he is also noticed feeling hot and cold at times but no objective fever.  Also has had some nausea and decreased p.o. intake in the last few days.  In the ED, he was afebrile, hypertensive up to systolic 270W. He had mild leukocytosis of 10.8 and stable anemia with hemoglobin of 11.1.  Sodium of 133, creatinine of 2.56 from a prior of 1.25 a week ago.  GFR 25 from a prior of greater than 60.  Baseline appears to be around 1.1.   Review of Systems: Constitutional: No Weight Change, No Fever ENT/Mouth: No sore throat, No Rhinorrhea Eyes: No Eye Pain, No Vision Changes Cardiovascular: No Chest Pain, no SOB Respiratory: No Cough, No Sputum Gastrointestinal: +  Nausea, No Vomiting, No Diarrhea, No Constipation, No Pain Genitourinary: no Urinary Incontinence Musculoskeletal: No Arthralgias, No Myalgias Skin: No Skin Lesions, No Pruritus, Neuro: no Weakness, No Numbness Psych: No Anxiety/Panic, No Depression,+ decrease appetite Heme/Lymph: No Bruising, No Bleeding  Past Medical History:  Diagnosis Date  . Chronic kidney disease   . Complication of anesthesia    Hard to wake  . Degenerative joint disease (DJD) of lumbar spine   . Diabetes mellitus without complication ([place])    Type II  . Essential hypertension 02/23/2017  . Hypertension   . Lumbar stenosis   . Proteinuria   . Radicular leg pain     Past Surgical History:  Procedure Laterality Date  . BELPHAROPTOSIS REPAIR    . CARDIAC CATHETERIZATION    . EYE SURGERY    . HEMORRHOID SURGERY    . TRANSFORAMINAL LUMBAR INTERBODY FUSION (TLIF) WITH PEDICLE SCREW FIXATION 2 LEVEL N/A 07/06/2018   Procedure: TRANSFORAMINAL LUMBAR INTERBODY FUSION (TLIF) L4-S1;  Surgeon: Melina Schools, MD;  Location: [facility];  Service: Orthopedics;  Laterality: N/A;     reports that he has never smoked. He has never used smokeless tobacco. He reports current alcohol use of about 6.0 standard drinks of alcohol per week. He reports that he does not use drugs.  No Known Allergies  Family History  Problem Relation Age of Onset  . Heart attack Father   . Hypertension Father   . Vision loss Sister        one eye  . Cancer Brother        skin  . Diabetes  Brother      Prior to Admission medications   Medication Sig Start Date End Date Taking? Authorizing Provider  amLODipine (NORVASC) 10 MG tablet Take 1 tablet (10 mg total) by mouth daily. 10/01/19  Yes Terald Sleeper, PA-C  cadexomer iodine (IODOSORB) 0.9 % gel Apply 1 application topically daily as needed for wound care. 05/15/20  Yes Trula Slade, DPM  cholecalciferol (VITAMIN D3) 25 MCG (1000 UNIT) tablet Take 2,000 Units by mouth daily.   Yes  [provider]  cloNIDine (CATAPRES) 0.3 MG tablet Take 1 tablet (0.3 mg total) by mouth 2 (two) times daily. 10/01/19  Yes Terald Sleeper, PA-C  collagenase (SANTYL) ointment Apply 1 application topically daily. Patient taking differently: Apply 1 application topically daily. Applied to affected toe 04/07/20  Yes Trula Slade, DPM  finasteride (PROSCAR) 5 MG tablet Take 1 tablet (5 mg total) by mouth daily. 05/06/20  Yes Gottschalk, Leatrice Jewels M, DO  gabapentin (NEURONTIN) 100 MG capsule Take 1 capsule (100 mg total) by mouth 3 (three) times daily. Patient taking differently: Take 100 mg by mouth in the morning and at bedtime.  05/02/20  Yes Felipa Furnace, DPM  insulin degludec (TRESIBA FLEXTOUCH) 100 UNIT/ML FlexTouch Pen Inject 0.28 mLs (28 Units total) into the skin every morning. Increase by one unit daily until fasting sugar is 150 Patient taking differently: Inject 28 Units into the skin every morning.  05/09/20  Yes Johnson, Clanford L, MD  irbesartan (AVAPRO) 300 MG tablet Take 1 tablet (300 mg total) by mouth daily. 05/09/20  Yes Johnson, Clanford L, MD  metFORMIN (GLUCOPHAGE-XR) 500 MG 24 hr tablet Take 2 tablets (1,000 mg total) by mouth 2 (two) times daily. TAKE  (1)  TABLET TWICE A DAY. Patient taking differently: Take 750 mg by mouth 2 (two) times daily.  01/01/20  Yes Terald Sleeper, PA-C  saccharomyces boulardii (FLORASTOR) 250 MG capsule Take 1 capsule (250 mg total) by mouth 2 (two) times daily. 05/09/20 07/08/20 Yes Johnson, Clanford L, MD  spironolactone (ALDACTONE) 25 MG tablet Take 25 mg by mouth daily. 03/26/20  Yes [provider]  tamsulosin (FLOMAX) 0.4 MG CAPS capsule Take 0.4 mg by mouth daily after supper.  03/26/20  Yes [provider]  cefTRIAXone (ROCEPHIN) IVPB Inject 2 g into the vein daily. Indication:  Osteomyelitis  First Dose: Yes Last Day of Therapy:  06/17/2020 Labs - Once weekly:  CBC/D and BMP, Labs - Every other week:  ESR and  CRP Method of administration: IV Push Method of administration may be changed at the discretion of home infusion pharmacist based upon assessment of the patient and/or caregiver's ability to self-administer the medication ordered. 05/10/20 06/18/20  Murlean Iba, MD  Multiple Vitamin (MULTIVITAMIN WITH MINERALS) TABS tablet Take 1 tablet by mouth daily. Patient not taking: Reported on 05/15/2020 05/10/20   Murlean Iba, MD  vancomycin IVPB Inject 1,750 mg into the vein daily. Indication:  osteomyelitis First Dose: Yes Last Day of Therapy:  06/17/2020 Labs - Sunday/Monday:  CBC/D, BMP, and vancomycin trough. Labs - Thursday:  BMP and vancomycin trough Labs - Every other week:  ESR and CRP Method of administration:Elastomeric Method of administration may be changed at the discretion of the patient and/or caregiver's ability to self-administer the medication ordered. 05/10/20 06/18/20  Murlean Iba, MD    Physical Exam: Vitals:   05/15/20 1512 05/15/20 1518 05/15/20 1813 05/15/20 1844  BP: (!) 156/69  (!) 184/77 (!) 184/77  Pulse: 92  93 85  Resp: _0 Temp: 98.9 F (37.2 C)     TempSrc: Oral     SpO2: 96%  99% 99%  Weight:  96.6 kg    Height:  6' (1.829 m)      Constitutional: NAD, calm, comfortable, nontoxic elderly male laying at 40 degree incline in bed Vitals:   05/15/20 1512 05/15/20 1518 05/15/20 1813 05/15/20 1844  BP: (!) 156/69  (!) 184/77 (!) 184/77  Pulse: 92  93 85  Resp: _1 Temp: 98.9 F (37.2 C)     TempSrc: Oral     SpO2: 96%  99% 99%  Weight:  96.6 kg    Height:  6' (1.829 m)     Eyes: PERRL, lids and conjunctivae normal ENMT: Mucous membranes are moist.  Neck: normal, supple Respiratory: clear to auscultation bilaterally, no wheezing, no crackles. Normal respiratory effort. Cardiovascular: Regular rate and rhythm, no murmurs / rubs / gallops. . 2+ pedal pulses.   Abdomen: no tenderness, no masses palpated.  Bowel sounds positive.   Musculoskeletal: no clubbing / cyanosis. No joint deformity upper and lower extremities.  Nonpitting edematous right dorsal foot with edematous right great toe with erythema and distal healing ulcer without any drainage or discharge.  Skin: no rashes, lesions,No induration Neurologic: CN 2-12 grossly intact.  Bilateral lower extremity neuropathy.  Strength 5/5 in all 4.  Psychiatric: Normal judgment and insight. Alert and oriented x 3. Normal mood.     Labs on Admission: I have personally reviewed following labs and imaging studies  CBC: Recent Labs  Lab 05/15/20 1614  WBC 10.8*  NEUTROABS 7.6  HGB 11.1*  HCT 31.4*  MCV 88.7  PLT 326   Basic Metabolic Panel: Recent Labs  Lab 05/15/20 1614  NA 133*  K 4.3  CL 101  CO2 22  GLUCOSE 181*  BUN 30*  CREATININE 2.56*  CALCIUM 9.3   GFR: Estimated Creatinine Clearance: 34.2 mL/min (A) (by C-G formula based on SCr of 2.56 mg/dL (H)). Liver Function Tests: Recent Labs  Lab 05/15/20 1614  AST 15  ALT 21  ALKPHOS 72  BILITOT 0.8  PROT 7.2  ALBUMIN 3.5   No results for input(s): LIPASE, AMYLASE in the last 168 hours. No results for input(s): AMMONIA in the last 168 hours. Coagulation Profile: Recent Labs  Lab 05/15/20 1753  INR 1.0   Cardiac Enzymes: No results for input(s): CKTOTAL, CKMB, CKMBINDEX, TROPONINI in the last 168 hours. BNP (last 3 results) No results for input(s): PROBNP in the last 8760 hours. HbA1C: No results for input(s): HGBA1C in the last 72 hours. CBG: Recent Labs  Lab 05/08/20 2256 05/09/20 0744 05/09/20 1157  GLUCAP 185* 168* 335*   Lipid Profile: No results for input(s): CHOL, HDL, LDLCALC, TRIG, CHOLHDL, LDLDIRECT in the last 72 hours. Thyroid Function Tests: No results for input(s): TSH, T4TOTAL, FREET4, T3FREE, THYROIDAB in the last 72 hours. Anemia Panel: No results for input(s): VITAMINB12, FOLATE, FERRITIN, TIBC, IRON, RETICCTPCT in the last 72 hours. Urine analysis:     Component Value Date/Time   COLORURINE YELLOW 05/15/2020 1814   APPEARANCEUR CLEAR 05/15/2020 1814   APPEARANCEUR Clear 11/10/2018 1543   LABSPEC 1.005 05/15/2020 1814   PHURINE 5.0 05/15/2020 1814   GLUCOSEU 50 (A) 05/15/2020 1814   HGBUR SMALL (A) 05/15/2020 1814   BILIRUBINUR NEGATIVE 05/15/2020 1814   BILIRUBINUR Negative 11/10/2018 [place] 05/15/2020 1814  PROTEINUR NEGATIVE 05/15/2020 1814   NITRITE NEGATIVE 05/15/2020 1814   LEUKOCYTESUR NEGATIVE 05/15/2020 1814    Radiological Exams on Admission: No results found.    Assessment/Plan  Worsening osteomyelitis of the right great toe following discontinuation of IV antibiotics Previously on IV vancomycin and Rocephin but now with worsening renal function. Will switch to renally dose Zosyn by pharmacy.  No cultures from previous hospitalization and no drainage from wound at this time to culture. Had normal ABI on 6/23 bilaterally Consider infectious disease and podiatry consult if no improvement in switching antibiotics  Acute on CKD stage II renal injury secondary to IV antibiotics IV antibiotics has been switched as stated above We will check FENA labs follow BMP tomorrow following fluids and new antibiotics before deciding on renal ultrasound  Avoid nephrotoxic agent  Hypertension Continue amlodipine and clonidine.  Hold ARB and spironolactone due to AKI.  Insulin-dependent type 2 diabetes Normally on 28 units a.m. [name] and Metformin Lantus 20units in the AM and moderate SSI here Decrease gabapentin to once a day due to worsening renal function  BPH continue Finasteride and Flomax  OSA Noncompliant with CPAP.  Patient discontinued himself 2 years ago.  DVT prophylaxis:.Heparin SQ Code Status: Full Family Communication: Plan discussed with patient at bedside  disposition Plan: Home with at least 2 midnight stays  Consults called:  Admission status: inpatient  Status is:  Inpatient  Remains inpatient appropriate because:Inpatient level of care appropriate due to severity of illness   Dispo: The patient is from: Home              Anticipated d/c is to: Home              Anticipated d/c date is: 3 days              Patient currently is not medically stable to d/c.         Orene Desanctis DO [facility]   If 7PM-7AM, please contact night-coverage [email or web address]   05/15/2020, 7:12 PM

## 2020-05-15 NOTE — Telephone Encounter (Signed)
Faxed required form, clinicals and demographics to Infectious Disease. 

## 2020-05-15 NOTE — ED Provider Notes (Signed)
[facility] DEPT Provider Note   CSN: [id number] Arrival date & time: 05/15/20  1501     History Chief Complaint  Patient presents with  . Foot Swelling    Maya Scholer is a 64 y.o. male.  The history is provided by the patient and medical records. No language interpreter was used.   Mariah Harn is a 64 y.o. male who presents to the Emergency Department complaining of foot swelling.  He has been on IV abx for osteomyelitis of the foot.  His abx was discontinued two days ago due to kidney damage.  He presents to the ED today due to two days of progressive pain, swelling and redness to his right foot.    Denies fever, chest pain, sob, vomiting.  He has mild nausea, diarrhea.  He is able to take all his other medications as directed.  He has a PICC line in his RUE.  Lives with wife and son.      Past Medical History:  Diagnosis Date  . Chronic kidney disease   . Complication of anesthesia    Hard to wake  . Degenerative joint disease (DJD) of lumbar spine   . Diabetes mellitus without complication ([place])    Type II  . Essential hypertension 02/23/2017  . Hypertension   . Lumbar stenosis   . Proteinuria   . Radicular leg pain     Patient Active Problem List   Diagnosis Date Noted  . Osteomyelitis ([place]) 05/15/2020  . AKI (acute kidney injury) ([place]) 05/15/2020  . OSA (obstructive sleep apnea) 05/15/2020  . Cellulitis of right foot 05/07/2020  . Type 2 diabetes mellitus ([place]) 05/07/2020  . Cellulitis of right lower extremity   . Diabetic infection of right foot ([place])   . Cellulitis of great toe, right   . Diabetic ulcer of toe of right foot associated with type 2 diabetes mellitus, with fat layer exposed ([place]) 04/15/2020  . OSA on CPAP 09/01/2018  . S/P lumbar fusion 07/06/2018  . DDD (degenerative disc disease), lumbar 02/28/2018  . Hypertension associated with diabetes ([place]) 02/23/2017  . Benign prostatic hyperplasia with  incomplete bladder emptying 02/23/2017  . Vitamin D deficiency 02/23/2017  . Hyperlipidemia associated with type 2 diabetes mellitus ([place]) 02/23/2017  . Type 2 diabetes mellitus with stage 2 chronic kidney disease, without long-term current use of insulin ([place]) 02/23/2017    Past Surgical History:  Procedure Laterality Date  . BELPHAROPTOSIS REPAIR    . CARDIAC CATHETERIZATION    . EYE SURGERY    . HEMORRHOID SURGERY    . TRANSFORAMINAL LUMBAR INTERBODY FUSION (TLIF) WITH PEDICLE SCREW FIXATION 2 LEVEL N/A 07/06/2018   Procedure: TRANSFORAMINAL LUMBAR INTERBODY FUSION (TLIF) L4-S1;  Surgeon: Melina Schools, MD;  Location: [facility];  Service: Orthopedics;  Laterality: N/A;       Family History  Problem Relation Age of Onset  . Heart attack Father   . Hypertension Father   . Vision loss Sister        one eye  . Cancer Brother        skin  . Diabetes Brother     Social History   Tobacco Use  . Smoking status: Never Smoker  . Smokeless tobacco: Never Used  Vaping Use  . Vaping Use: Never used  Substance Use Topics  . Alcohol use: Yes    Alcohol/week: 6.0 standard drinks    Types: 6 Cans of beer per week  . Drug use: No  Home Medications Prior to Admission medications   Medication Sig Start Date End Date Taking? Authorizing Provider  amLODipine (NORVASC) 10 MG tablet Take 1 tablet (10 mg total) by mouth daily. 10/01/19  Yes Terald Sleeper, PA-C  cadexomer iodine (IODOSORB) 0.9 % gel Apply 1 application topically daily as needed for wound care. 05/15/20  Yes Trula Slade, DPM  cholecalciferol (VITAMIN D3) 25 MCG (1000 UNIT) tablet Take 2,000 Units by mouth daily.   Yes [provider]  cloNIDine (CATAPRES) 0.3 MG tablet Take 1 tablet (0.3 mg total) by mouth 2 (two) times daily. 10/01/19  Yes Terald Sleeper, PA-C  collagenase (SANTYL) ointment Apply 1 application topically daily. Patient taking differently: Apply 1 application topically daily. Applied to  affected toe 04/07/20  Yes Trula Slade, DPM  finasteride (PROSCAR) 5 MG tablet Take 1 tablet (5 mg total) by mouth daily. 05/06/20  Yes Gottschalk, Leatrice Jewels M, DO  gabapentin (NEURONTIN) 100 MG capsule Take 1 capsule (100 mg total) by mouth 3 (three) times daily. Patient taking differently: Take 100 mg by mouth in the morning and at bedtime.  05/02/20  Yes Felipa Furnace, DPM  insulin degludec (TRESIBA FLEXTOUCH) 100 UNIT/ML FlexTouch Pen Inject 0.28 mLs (28 Units total) into the skin every morning. Increase by one unit daily until fasting sugar is 150 Patient taking differently: Inject 28 Units into the skin every morning.  05/09/20  Yes Johnson, Clanford L, MD  irbesartan (AVAPRO) 300 MG tablet Take 1 tablet (300 mg total) by mouth daily. 05/09/20  Yes Johnson, Clanford L, MD  metFORMIN (GLUCOPHAGE-XR) 500 MG 24 hr tablet Take 2 tablets (1,000 mg total) by mouth 2 (two) times daily. TAKE  (1)  TABLET TWICE A DAY. Patient taking differently: Take 750 mg by mouth 2 (two) times daily.  01/01/20  Yes Terald Sleeper, PA-C  saccharomyces boulardii (FLORASTOR) 250 MG capsule Take 1 capsule (250 mg total) by mouth 2 (two) times daily. 05/09/20 07/08/20 Yes Johnson, Clanford L, MD  spironolactone (ALDACTONE) 25 MG tablet Take 25 mg by mouth daily. 03/26/20  Yes [provider]  tamsulosin (FLOMAX) 0.4 MG CAPS capsule Take 0.4 mg by mouth daily after supper.  03/26/20  Yes [provider]  cefTRIAXone (ROCEPHIN) IVPB Inject 2 g into the vein daily. Indication:  Osteomyelitis  First Dose: Yes Last Day of Therapy:  06/17/2020 Labs - Once weekly:  CBC/D and BMP, Labs - Every other week:  ESR and CRP Method of administration: IV Push Method of administration may be changed at the discretion of home infusion pharmacist based upon assessment of the patient and/or caregiver's ability to self-administer the medication ordered. 05/10/20 06/18/20  Murlean Iba, MD  Multiple Vitamin (MULTIVITAMIN  WITH MINERALS) TABS tablet Take 1 tablet by mouth daily. Patient not taking: Reported on 05/15/2020 05/10/20   Murlean Iba, MD  vancomycin IVPB Inject 1,750 mg into the vein daily. Indication:  osteomyelitis First Dose: Yes Last Day of Therapy:  06/17/2020 Labs - Sunday/Monday:  CBC/D, BMP, and vancomycin trough. Labs - Thursday:  BMP and vancomycin trough Labs - Every other week:  ESR and CRP Method of administration:Elastomeric Method of administration may be changed at the discretion of the patient and/or caregiver's ability to self-administer the medication ordered. 05/10/20 06/18/20  Murlean Iba, MD    Allergies    Patient has no known allergies.  Review of Systems   Review of Systems  All other systems reviewed and are negative.  Physical Exam Updated Vital Signs BP (!) 153/73 (BP Location: Left Arm)   Pulse 86   Temp 98.9 F (37.2 C) (Oral)   Resp 18   Ht 6' (1.829 m)   Wt 96.6 kg   SpO2 98%   BMI 28.89 kg/m   Physical Exam Vitals and nursing note reviewed.  Constitutional:      Appearance: He is well-developed.  HENT:     Head: Normocephalic and atraumatic.  Cardiovascular:     Rate and Rhythm: Normal rate and regular rhythm.     Heart sounds: No murmur heard.   Pulmonary:     Effort: Pulmonary effort is normal. No respiratory distress.     Breath sounds: Normal breath sounds.  Abdominal:     Palpations: Abdomen is soft.     Tenderness: There is no abdominal tenderness. There is no guarding or rebound.  Musculoskeletal:        General: No tenderness.     Comments: 2+ DP pulses bilaterally.  Moderate edema and erythema to RLE.  Erythema predominantly to great toe and distal foot with patch of erythema to right shin.  There is a wound to the distal right great toe with small amount of exudate.  Trace edema to LLE.   Skin:    General: Skin is warm and dry.  Neurological:     Mental Status: He is alert and oriented to person, place, and time.    Psychiatric:        Behavior: Behavior normal.     ED Results / Procedures / Treatments   Labs (all labs ordered are listed, but only abnormal results are displayed) Labs Reviewed  COMPREHENSIVE METABOLIC PANEL - Abnormal; Notable for the following components:      Result Value   Sodium 133 (*)    Glucose, Bld 181 (*)    BUN 30 (*)    Creatinine, Ser 2.56 (*)    GFR calc non Af Amer 25 (*)    GFR calc Af Amer 29 (*)    All other components within normal limits  CBC WITH DIFFERENTIAL/PLATELET - Abnormal; Notable for the following components:   WBC 10.8 (*)    RBC 3.54 (*)    Hemoglobin 11.1 (*)    HCT 31.4 (*)    Abs Immature Granulocytes 0.11 (*)    All other components within normal limits  URINALYSIS, ROUTINE W REFLEX MICROSCOPIC - Abnormal; Notable for the following components:   Glucose, UA 50 (*)    Hgb urine dipstick SMALL (*)    Bacteria, UA RARE (*)    All other components within normal limits  CBG MONITORING, ED - Abnormal; Notable for the following components:   Glucose-Capillary 114 (*)    All other components within normal limits  SARS CORONAVIRUS 2 BY RT PCR (HOSPITAL ORDER, [facility])  CULTURE, BLOOD (ROUTINE X 2)  CULTURE, BLOOD (ROUTINE X 2)  URINE CULTURE  LACTIC ACID, PLASMA  PROTIME-INR  BASIC METABOLIC PANEL  CBC  CREATININE, URINE, 24 HOUR  SODIUM, URINE, RANDOM    EKG None  Radiology No results found.  Procedures Procedures (including critical care time)  Medications Ordered in ED Medications  heparin injection 5,000 Units (5,000 Units Subcutaneous Given 05/15/20 2202)  0.9 %  sodium chloride infusion ( Intravenous New Bag/Given (Non-Interop) 05/15/20 2013)  piperacillin-tazobactam (ZOSYN) IVPB 3.375 g (has no administration in time range)  amLODipine (NORVASC) tablet 10 mg (has no administration in time range)  cloNIDine (CATAPRES) tablet 0.3 mg (0.3 mg Oral Refused 05/15/20 2201)  finasteride (PROSCAR) tablet  5 mg (has no administration in time range)  tamsulosin (FLOMAX) capsule 0.4 mg (has no administration in time range)  gabapentin (NEURONTIN) capsule 100 mg (has no administration in time range)  insulin glargine (LANTUS) injection 20 Units (has no administration in time range)  insulin aspart (novoLOG) injection 0-5 Units (0 Units Subcutaneous Not Given 05/15/20 2157)  insulin aspart (novoLOG) injection 0-15 Units (has no administration in time range)  piperacillin-tazobactam (ZOSYN) IVPB 3.375 g (0 g Intravenous Stopped 05/15/20 1957)  sodium chloride 0.9 % bolus 1,000 mL (0 mLs Intravenous Stopped 05/15/20 2157)    ED Course  I have reviewed the triage vital signs and the nursing notes.  Pertinent labs & imaging results that were available during my care of the patient were reviewed by me and considered in my medical decision making (see chart for details).    MDM Rules/Calculators/A&P                         Patient here for evaluation of increased swelling to the right foot. Examination consistent with cellulitis. Labs significant for acute kidney injury. Discussed with patient findings of studies recommendation for admission and he is in agreement treatment plan. Hospitalist consulted for admission.  Final Clinical Impression(s) / ED Diagnoses Final diagnoses:  Cellulitis of right lower extremity    Rx / DC Orders ED Discharge Orders    None       Quintella Reichert, MD 05/15/20 2341

## 2020-05-15 NOTE — Telephone Encounter (Signed)
I informed pt of Dr. Leigh Aurora orders for iodosorb and pt states his leg was red and the [facility] told him to go to the ED. I told pt not to worry about our medication order at this time to go to the ED and let us know what they do.

## 2020-05-15 NOTE — Progress Notes (Addendum)
Pharmacy Antibiotic Note  [name] is a 64 y.o. male admitted on 05/15/2020 with acute kidney injury secondary to Vancomycin.  He is currently on 6 wks IV antibiotic course (thru 06/17/20) for osteomyelitis of toe.   Scr has doubled in past week since starting Vancomycin + Rocephin.  Baseline Scr ~ 1.1-1.2.  He was told to stop these yesterday (last dose 6/29) due to worsening renal function and presents today with worsening cellulitis/swelling of foot. Pharmacy has been consulted for Zosyn dosing.  Plan: Zosyn 3.375g IV q8h (4 hour infusion).  Watch renal function.  Height: 6' (182.9 cm) Weight: 96.6 kg (213 lb) IBW/kg (Calculated) : 77.6  Temp (24hrs), Avg:98.9 F (37.2 C), Min:98.9 F (37.2 C), Max:98.9 F (37.2 C)  Recent Labs  Lab 05/09/20 0537 05/15/20 1614  WBC  --  10.8*  CREATININE  --  2.56*  LATICACIDVEN  --  1.1  VANCOTROUGH 21*  --     Estimated Creatinine Clearance: 34.2 mL/min (A) (by C-G formula based on SCr of 2.56 mg/dL (H)).    No Known Allergies  Antimicrobials this admission: 7/1 Zosyn >>   Dose adjustments this admission:  Microbiology results: 7/1 UCx:    Thank you for allowing pharmacy to be a part of this patient's care.  Netta Cedars PharmD 05/15/2020 7:13 PM

## 2020-05-15 NOTE — Telephone Encounter (Signed)
Left message for [name] to call for orders.

## 2020-05-16 ENCOUNTER — Ambulatory Visit: Payer: PPO | Admitting: Podiatry

## 2020-05-16 ENCOUNTER — Inpatient Hospital Stay (HOSPITAL_COMMUNITY): Payer: PPO

## 2020-05-16 ENCOUNTER — Telehealth: Payer: Self-pay | Admitting: Podiatry

## 2020-05-16 DIAGNOSIS — M86171 Other acute osteomyelitis, right ankle and foot: Secondary | ICD-10-CM

## 2020-05-16 DIAGNOSIS — Z794 Long term (current) use of insulin: Secondary | ICD-10-CM

## 2020-05-16 DIAGNOSIS — E1122 Type 2 diabetes mellitus with diabetic chronic kidney disease: Secondary | ICD-10-CM

## 2020-05-16 DIAGNOSIS — N179 Acute kidney failure, unspecified: Secondary | ICD-10-CM

## 2020-05-16 DIAGNOSIS — L97509 Non-pressure chronic ulcer of other part of unspecified foot with unspecified severity: Secondary | ICD-10-CM

## 2020-05-16 DIAGNOSIS — E11621 Type 2 diabetes mellitus with foot ulcer: Secondary | ICD-10-CM

## 2020-05-16 DIAGNOSIS — L03115 Cellulitis of right lower limb: Secondary | ICD-10-CM

## 2020-05-16 DIAGNOSIS — N182 Chronic kidney disease, stage 2 (mild): Secondary | ICD-10-CM

## 2020-05-16 LAB — BASIC METABOLIC PANEL
Anion gap: 9 (ref 5–15)
BUN: 28 mg/dL — ABNORMAL HIGH (ref 8–23)
CO2: 23 mmol/L (ref 22–32)
Calcium: 9.1 mg/dL (ref 8.9–10.3)
Chloride: 107 mmol/L (ref 98–111)
Creatinine, Ser: 2.51 mg/dL — ABNORMAL HIGH (ref 0.61–1.24)
GFR calc Af Amer: 30 mL/min — ABNORMAL LOW (ref >60)
GFR calc non Af Amer: 26 mL/min — ABNORMAL LOW (ref >60)
Glucose, Bld: 111 mg/dL — ABNORMAL HIGH (ref 70–99)
Potassium: 4.1 mmol/L (ref 3.5–5.1)
Sodium: 139 mmol/L (ref 135–145)

## 2020-05-16 LAB — CBC
HCT: 28 % — ABNORMAL LOW (ref 39.0–52.0)
Hemoglobin: 9.9 g/dL — ABNORMAL LOW (ref 13.0–17.0)
MCH: 31.5 pg (ref 26.0–34.0)
MCHC: 35.4 g/dL (ref 30.0–36.0)
MCV: 89.2 fL (ref 80.0–100.0)
Platelets: 306 10*3/uL (ref 150–400)
RBC: 3.14 MIL/uL — ABNORMAL LOW (ref 4.22–5.81)
RDW: 12.4 % (ref 11.5–15.5)
WBC: 8.4 10*3/uL (ref 4.0–10.5)
nRBC: 0 % (ref 0.0–0.2)

## 2020-05-16 LAB — CBG MONITORING, ED
Glucose-Capillary: 127 mg/dL — ABNORMAL HIGH (ref 70–99)
Glucose-Capillary: 134 mg/dL — ABNORMAL HIGH (ref 70–99)

## 2020-05-16 LAB — GLUCOSE, CAPILLARY
Glucose-Capillary: 130 mg/dL — ABNORMAL HIGH (ref 70–99)
Glucose-Capillary: 169 mg/dL — ABNORMAL HIGH (ref 70–99)

## 2020-05-16 LAB — CK: Total CK: 37 U/L — ABNORMAL LOW (ref 49–397)

## 2020-05-16 LAB — SODIUM, URINE, RANDOM: Sodium, Ur: 23 mmol/L

## 2020-05-16 MED ORDER — SODIUM CHLORIDE 0.9 % IV SOLN
750.0000 mg | Freq: Every day | INTRAVENOUS | Status: DC
  Administered 2020-05-16 – 2020-05-17 (×2): 750 mg via INTRAVENOUS
  Filled 2020-05-16 (×2): qty 15

## 2020-05-16 MED ORDER — CHLORHEXIDINE GLUCONATE CLOTH 2 % EX PADS
6.0000 | MEDICATED_PAD | Freq: Every day | CUTANEOUS | Status: DC
  Administered 2020-05-16: 6 via TOPICAL

## 2020-05-16 MED ORDER — ONDANSETRON HCL 4 MG/2ML IJ SOLN
4.0000 mg | Freq: Four times a day (QID) | INTRAMUSCULAR | Status: DC | PRN
  Administered 2020-05-16: 4 mg via INTRAVENOUS
  Filled 2020-05-16: qty 2

## 2020-05-16 MED ORDER — SODIUM CHLORIDE 0.9% FLUSH: 10.0000 mL | INTRAVENOUS | Status: DC | PRN

## 2020-05-16 NOTE — Anesthesia Preprocedure Evaluation (Addendum)
Anesthesia Evaluation  Patient identified by MRN, date of birth, ID band Patient awake    Reviewed: Allergy & Precautions, NPO status , Patient's Chart, lab work & pertinent test results  Airway Mallampati: II  TM Distance: >3 FB Neck ROM: Full    Dental no notable dental hx. (+) Teeth Intact, Dental Advisory Given   Pulmonary sleep apnea ,    Pulmonary exam normal breath sounds clear to auscultation       Cardiovascular hypertension, Pt. on medications + CAD  Normal cardiovascular exam Rhythm:Regular Rate:Normal     Neuro/Psych negative neurological ROS  negative psych ROS   GI/Hepatic   Endo/Other  diabetes, Type 2, Insulin Dependent  Renal/GU CRFRenal diseaseLab Results      Component                Value               Date                      CREATININE               2.51 (H)            05/16/2020                BUN                      28 (H)              05/16/2020                NA                       139                 05/16/2020                K                        4.1                 05/16/2020                CL                       107                 05/16/2020                CO2                      23                  05/16/2020                Musculoskeletal  (+) Arthritis ,   Abdominal   Peds  Hematology  (+) anemia , Lab Results      Component                Value               Date                      WBC  8.4                 05/16/2020                HGB                      9.9 (L)             05/16/2020                HCT                      28.0 (L)            05/16/2020                MCV                      89.2                05/16/2020                PLT                      306                 05/16/2020              Anesthesia Other Findings   Reproductive/Obstetrics                            Anesthesia Physical Anesthesia  Plan  ASA: III and emergent  Anesthesia Plan: MAC   Post-op Pain Management:    Induction:   PONV Risk Score and Plan: 2 and Treatment may vary due to age or medical condition and Ondansetron  Airway Management Planned: Nasal Cannula and Natural Airway  Additional Equipment: None  Intra-op Plan:   Post-operative Plan:   Informed Consent: I have reviewed the patients History and Physical, chart, labs and discussed the procedure including the risks, benefits and alternatives for the proposed anesthesia with the patient or authorized representative who has indicated his/her understanding and acceptance.     Dental advisory given  Plan Discussed with: CRNA  Anesthesia Plan Comments:        Anesthesia Quick Evaluation

## 2020-05-16 NOTE — Progress Notes (Signed)
PROGRESS NOTE    [name]  SFK:[id number] DOB: [birth year] DOA: 05/15/2020 PCP: [name], DO   Brief Narrative:  [name] is a 64 y.o. male with medical history significant for stage II CKD, proteinuria, history of difficulty waking up from anesthesia, DJD of lumbar spine, radicular leg pain, lumbar stenosis, insulin-dependent type 2 diabetes, essential hypertension, and OSA noncompliant with CPAP who presents with worsening edema, erythema of his right great toe. Patient has had cellulitis of the right great toe followed by Dr. [name] of podiatry in [facility] in [place] and ultimately required hospitalization at [facility] in June for osteomyelitis of the right great toe.  He was sent home with a PICC line with plans to complete 6 weeks of IV vancomycin and Rocephin (05/10/20-06/17/20). Antibiotics was discontinued yesterday due to worsening renal function seen on routine lab work. Since discontinuation of his antibiotics, he has noticed worsening swelling and edema of the right toe.he is also noticed feeling hot and cold at times but no objective fever.  Also has had some nausea and decreased p.o. intake in the last few days.  Assessment & Plan:   Principal Problem:   Osteomyelitis ([place]) Active Problems:   Hypertension associated with diabetes ([place])   Type 2 diabetes mellitus with stage 2 chronic kidney disease, without long-term current use of insulin (H[facility])   Type 2 diabetes mellitus (HCC)   AKI (acute kidney injury) (HCC)   OSA (obstructive sleep apnea)  Worsening osteomyelitis of the right great toe with failure of outpatient antibiotics Previously on IV vancomycin and Rocephin but now with worsening renal function. Spoke with ID, transition to Dapto and Zosyn currently Hopefully will be able to get cultures during hospitalization through debridement, podiatry has been consulted Had normal ABI on 6/23 bilaterally  Acute on CKD stage  II renal injury secondary to IV antibiotics IV antibiotics have been transitioned as above Baseline creatinine around 1.1 Lab Results  Component Value Date   CREATININE 2.51 (H) 05/16/2020   CREATININE 2.56 (H) 05/15/2020   CREATININE 1.25 (H) 05/08/2020   Hypertension, essential Continue amlodipine and clonidine.   Hold ARB and spironolactone due to AKI.  Insulin-dependent type 2 diabetes Normally on 28 units a.m. [name] and Metformin Lantus 20units in the AM and moderate SSI here Decrease gabapentin to once a day due to worsening renal function  BPH continue Finasteride and Flomax  OSA Noncompliant with CPAP.  Patient discontinued himself 2 years ago.  DVT prophylaxis: Heparin Code Status: Full Family Communication: None present  Status is: Inpatient Dispo: The patient is from: Home              Anticipated d/c is to: Likely home pending clinical course              Anticipated d/c date is: 72 to 96 hours pending clinical course cultures and possible surgical evaluation              Patient currently not medically stable for discharge due to ongoing need for prolonged IV antibiotics, cultures and hopeful debridement of the toe as above.  Consultants:   Podiatry, ID sideline  Procedures:   None planned  Antimicrobials:  Daptomycin, Zosyn as above  Subjective: No acute issues or events overnight, patient tolerating p.o. well, pain currently well controlled.  Denies nausea, vomiting, diarrhea, constipation, headache, fevers, chills.  Objective: Vitals:   05/16/20 1230 05/16/20 1300 05/16/20 1320 05/16/20 1330  BP: 127/83 133/69 133/69  137/66  Pulse: 66 66 68 65  Resp: 17 15 17    Temp:   98.6 F (37 C)   TempSrc:   Oral   SpO2: 97% 96% 97% 98%  Weight:      Height:        Intake/Output Summary (Last 24 hours) at 05/16/2020 1407 Last data filed at 05/16/2020 1346 Gross per 24 hour  Intake 1650 ml  Output 375 ml  Net 1275 ml   Filed Weights    05/15/20 1518  Weight: 96.6 kg    Examination:  General:  Pleasantly resting in bed, No acute distress. HEENT:  Normocephalic atraumatic.  Sclerae nonicteric, noninjected.  Extraocular movements intact bilaterally. Neck:  Without mass or deformity.  Trachea is midline. Lungs:  Clear to auscultate bilaterally without rhonchi, wheeze, or rales. Heart:  Regular rate and rhythm.  Without murmurs, rubs, or gallops. Abdomen:  Soft, nontender, nondistended.  Without guarding or rebound. Extremities: Without cyanosis, clubbing, edema, or obvious deformity. Vascular:  Dorsalis pedis and posterior tibial pulses palpable bilaterally. Skin:  Warm and dry, no erythema, right great toe inferior ulceration with purulent discharge no blanching erythema to the proximal phalanx    Data Reviewed: I have personally reviewed following labs and imaging studies  CBC: Recent Labs  Lab 05/15/20 1614 05/16/20 0444  WBC 10.8* 8.4  NEUTROABS 7.6  --   HGB 11.1* 9.9*  HCT 31.4* 28.0*  MCV 88.7 89.2  PLT 311 096   Basic Metabolic Panel: Recent Labs  Lab 05/15/20 1614 05/16/20 0444  NA 133* 139  K 4.3 4.1  CL 101 107  CO2 22 23  GLUCOSE 181* 111*  BUN 30* 28*  CREATININE 2.56* 2.51*  CALCIUM 9.3 9.1   GFR: Estimated Creatinine Clearance: 34.9 mL/min (A) (by C-G formula based on SCr of 2.51 mg/dL (H)). Liver Function Tests: Recent Labs  Lab 05/15/20 1614  AST 15  ALT 21  ALKPHOS 72  BILITOT 0.8  PROT 7.2  ALBUMIN 3.5   No results for input(s): LIPASE, AMYLASE in the last 168 hours. No results for input(s): AMMONIA in the last 168 hours. Coagulation Profile: Recent Labs  Lab 05/15/20 1753  INR 1.0   Cardiac Enzymes: Recent Labs  Lab 05/16/20 1220  CKTOTAL 37*   BNP (last 3 results) No results for input(s): PROBNP in the last 8760 hours. HbA1C: No results for input(s): HGBA1C in the last 72 hours. CBG: Recent Labs  Lab 05/15/20 2148 05/16/20 0810 05/16/20 1139  GLUCAP  114* 127* 134*   Lipid Profile: No results for input(s): CHOL, HDL, LDLCALC, TRIG, CHOLHDL, LDLDIRECT in the last 72 hours. Thyroid Function Tests: No results for input(s): TSH, T4TOTAL, FREET4, T3FREE, THYROIDAB in the last 72 hours. Anemia Panel: No results for input(s): VITAMINB12, FOLATE, FERRITIN, TIBC, IRON, RETICCTPCT in the last 72 hours. Sepsis Labs: Recent Labs  Lab 05/15/20 1614  LATICACIDVEN 1.1    Recent Results (from the past 240 hour(s))  Blood culture (routine x 2)     Status: None   Collection Time: 05/06/20 11:16 PM   Specimen: Left Antecubital; Blood  Result Value Ref Range Status   Specimen Description LEFT ANTECUBITAL  Final   Special Requests   Final    BOTTLES DRAWN AEROBIC AND ANAEROBIC Blood Culture adequate volume   Culture   Final    NO GROWTH 7 DAYS Performed at [facility], [address], [place], [place] [address]    Report Status 05/13/2020 FINAL  Final  Blood culture (routine x 2)     Status: None   Collection Time: 05/06/20 11:19 PM   Specimen: Right Antecubital; Blood  Result Value Ref Range Status   Specimen Description RIGHT ANTECUBITAL  Final   Special Requests   Final    BOTTLES DRAWN AEROBIC AND ANAEROBIC Blood Culture adequate volume   Culture   Final    NO GROWTH 7 DAYS Performed at [facility], [address] [place], [place] [address]    Report Status 05/13/2020 FINAL  Final  SARS Coronavirus 2 by RT PCR (hospital order, performed in [facility] hospital lab) Nasopharyngeal Nasopharyngeal Swab     Status: None   Collection Time: 05/07/20  2:01 AM   Specimen: Nasopharyngeal Swab  Result Value Ref Range Status   SARS Coronavirus 2 NEGATIVE NEGATIVE Final    Comment: (NOTE) SARS-CoV-2 target nucleic acids are NOT DETECTED.  The SARS-CoV-2 RNA is generally detectable in upper and lower respiratory specimens during the acute phase of infection. The lowest concentration of SARS-CoV-2 viral copies this assay can detect is  250 copies / mL. A negative result does not preclude SARS-CoV-2 infection and should not be used as the sole basis for treatment or other patient management decisions.  A negative result may occur with improper specimen collection / handling, submission of specimen other than nasopharyngeal swab, presence of viral mutation(s) within the areas targeted by this assay, and inadequate number of viral copies (<250 copies / mL). A negative result must be combined with clinical observations, patient history, and epidemiological information.  Fact Sheet for Patients:   StrictlyIdeas.no  Fact Sheet for Healthcare Providers: BankingDealers.co.za  This test is not yet approved or  cleared by the [place] FDA and has been authorized for detection and/or diagnosis of SARS-CoV-2 by FDA under an Emergency Use Authorization (EUA).  This EUA will remain in effect (meaning this test can be used) for the duration of the COVID-19 declaration under Section 564(b)(1) of the Act, 21 U.S.C. section 360bbb-3(b)(1), unless the authorization is terminated or revoked sooner.  Performed at [facility], [address] [place], [place] [address]   Blood Culture (routine x 2)     Status: None (Preliminary result)   Collection Time: 05/15/20  6:30 PM   Specimen: BLOOD  Result Value Ref Range Status   Specimen Description   Final    BLOOD LEFT ANTECUBITAL Performed at [facility] [address], [place], [place] [address]    Special Requests   Final    BOTTLES DRAWN AEROBIC AND ANAEROBIC Blood Culture results may not be optimal due to an excessive volume of blood received in culture bottles Performed at [facility] [address] [place], [place] [address]    Culture   Final    NO GROWTH < 12 HOURS Performed at [facility] [address], [place], [place] [address]    Report Status PENDING  Incomplete   SARS Coronavirus 2 by RT PCR (hospital order, performed in [facility] hospital lab) Nasopharyngeal Nasopharyngeal Swab     Status: None   Collection Time: 05/15/20  6:43 PM   Specimen: Nasopharyngeal Swab  Result Value Ref Range Status   SARS Coronavirus 2 NEGATIVE NEGATIVE Final    Comment: (NOTE) SARS-CoV-2 target nucleic acids are NOT DETECTED.  The SARS-CoV-2 RNA is generally detectable in upper and lower respiratory specimens during the acute phase of infection. The lowest concentration of SARS-CoV-2 viral copies this assay can  detect is 250 copies / mL. A negative result does not preclude SARS-CoV-2 infection and should not be used as the sole basis for treatment or other patient management decisions.  A negative result may occur with improper specimen collection / handling, submission of specimen other than nasopharyngeal swab, presence of viral mutation(s) within the areas targeted by this assay, and inadequate number of viral copies (<250 copies / mL). A negative result must be combined with clinical observations, patient history, and epidemiological information.  Fact Sheet for Patients:   StrictlyIdeas.no  Fact Sheet for Healthcare Providers: BankingDealers.co.za  This test is not yet approved or  cleared by the [place] FDA and has been authorized for detection and/or diagnosis of SARS-CoV-2 by FDA under an Emergency Use Authorization (EUA).  This EUA will remain in effect (meaning this test can be used) for the duration of the COVID-19 declaration under Section 564(b)(1) of the Act, 21 U.S.C. section 360bbb-3(b)(1), unless the authorization is terminated or revoked sooner.  Performed at [facility], [place] [address], [place], [place] [address]          Radiology Studies: No results found.      Scheduled Meds: . amLODipine  10 mg Oral Daily  . cloNIDine  0.3 mg Oral BID  .  finasteride  5 mg Oral Daily  . gabapentin  100 mg Oral Daily  . heparin  5,000 Units Subcutaneous Q8H  . insulin aspart  0-15 Units Subcutaneous TID WC  . insulin aspart  0-5 Units Subcutaneous QHS  . insulin glargine  20 Units Subcutaneous Daily  . tamsulosin  0.4 mg Oral QPC supper   Continuous Infusions: . sodium chloride Stopped (05/16/20 1346)  . DAPTOmycin (CUBICIN)  IV    . piperacillin-tazobactam (ZOSYN)  IV Stopped (05/16/20 1326)     LOS: 1 day   Time spent: 82min  Corby Vandenberghe C Saivion Goettel, DO [facility]  If 7PM-7AM, please contact night-coverage [email or web address]  05/16/2020, 2:07 PM

## 2020-05-16 NOTE — ED Notes (Signed)
Urine output 399mL urine noted in handheld urinal

## 2020-05-16 NOTE — Consult Note (Signed)
Reason for Consult: Osteomyelitis Referring Physician: Jeffren Dombek is an 64 y.o. male.  HPI: 64 year old male with known osteomyelitis of the right great toe. Admitted for worsening renal function in the setting of IV abx. Podiatry consulted for eval and possible debridement/biopsy. Patient states that once his abx with his PICC were stopped the toe became more reddened.   Past Medical History:  Diagnosis Date  . Chronic kidney disease   . Complication of anesthesia    Hard to wake  . Degenerative joint disease (DJD) of lumbar spine   . 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 Essential hypertension 02/23/2017  . Hypertension   . Lumbar stenosis   . Proteinuria   . Radicular leg pain     Past Surgical History:  Procedure Laterality Date  . BELPHAROPTOSIS REPAIR    . CARDIAC CATHETERIZATION    . EYE SURGERY    . HEMORRHOID SURGERY    . TRANSFORAMINAL LUMBAR INTERBODY FUSION (TLIF) WITH PEDICLE SCREW FIXATION 2 LEVEL N/A 07/06/2018   Procedure: TRANSFORAMINAL LUMBAR INTERBODY FUSION (TLIF) L4-S1;  Surgeon: Melina Schools, MD;  Location: [place];  Service: Orthopedics;  Laterality: N/A;    Family History  Problem Relation Age of Onset  . Heart attack Father   . Hypertension Father   . Vision loss Sister        one eye  . Cancer Brother        skin  . Diabetes Brother     Social History:  reports that he has never smoked. He has never used smokeless tobacco. He reports current alcohol use of about 6.0 standard drinks of alcohol per week. He reports that he does not use drugs.  Allergies: No Known Allergies  Medications: I have reviewed the patient's current medications.  Results for orders placed or performed during the hospital encounter of 05/15/20 (from the past 48 hour(s))  Lactic acid, plasma     Status: None   Collection Time: 05/15/20  4:14 PM  Result Value Ref Range   Lactic Acid, Venous 1.1 0.5 - 1.9 mmol/L    Comment:  Performed at [facility], [place] [address], [place], [place] [address]  Comprehensive metabolic panel     Status: Abnormal   Collection Time: 05/15/20  4:14 PM  Result Value Ref Range   Sodium 133 (L) 135 - 145 mmol/L   Potassium 4.3 3.5 - 5.1 mmol/L   Chloride 101 98 - 111 mmol/L   CO2 22 22 - 32 mmol/L   Glucose, Bld 181 (H) 70 - 99 mg/dL    Comment: Glucose reference range applies only to samples taken after fasting for at least 8 hours.   BUN 30 (H) 8 - 23 mg/dL   Creatinine, Ser 2.56 (H) 0.61 - 1.24 mg/dL   Calcium 9.3 8.9 - 10.3 mg/dL   Total Protein 7.2 6.5 - 8.1 g/dL   Albumin 3.5 3.5 - 5.0 g/dL   AST 15 15 - 41 U/L   ALT 21 0 - 44 U/L   Alkaline Phosphatase 72 38 - 126 U/L   Total Bilirubin 0.8 0.3 - 1.2 mg/dL   GFR calc non Af Amer 25 (L) >60 mL/min   GFR calc Af Amer 29 (L) >60 mL/min   Anion gap 10 5 - 15    Comment: Performed at [facility], [place] [address], [place], [place] [address]  CBC with Differential  Status: Abnormal   Collection Time: 05/15/20  4:14 PM  Result Value Ref Range   WBC 10.8 (H) 4.0 - 10.5 K/uL   RBC 3.54 (L) 4.22 - 5.81 MIL/uL   Hemoglobin 11.1 (L) 13.0 - 17.0 g/dL   HCT 31.4 (L) 39 - 52 %   MCV 88.7 80.0 - 100.0 fL   MCH 31.4 26.0 - 34.0 pg   MCHC 35.4 30.0 - 36.0 g/dL   RDW 12.1 11.5 - 15.5 %   Platelets 311 150 - 400 K/uL   nRBC 0.0 0.0 - 0.2 %   Neutrophils Relative % 70 %   Neutro Abs 7.6 1.7 - 7.7 K/uL   Lymphocytes Relative 19 %   Lymphs Abs 2.1 0.7 - 4.0 K/uL   Monocytes Relative 7 %   Monocytes Absolute 0.8 0 - 1 K/uL   Eosinophils Relative 3 %   Eosinophils Absolute 0.3 0 - 0 K/uL   Basophils Relative 0 %   Basophils Absolute 0.0 0 - 0 K/uL   Immature Granulocytes 1 %   Abs Immature Granulocytes 0.11 (H) 0.00 - 0.07 K/uL    Comment: Performed at [facility], [place] [address], [place], [place] [address]  Protime-INR     Status: None   Collection Time: 05/15/20  5:53  PM  Result Value Ref Range   Prothrombin Time 13.2 11.4 - 15.2 seconds   INR 1.0 0.8 - 1.2    Comment: (NOTE) INR goal varies based on device and disease states. Performed at [facility], [place] [address], [place], [place] [address]   Urinalysis, Routine w reflex microscopic     Status: Abnormal   Collection Time: 05/15/20  6:14 PM  Result Value Ref Range   Color, Urine YELLOW YELLOW   APPearance CLEAR CLEAR   Specific Gravity, Urine 1.005 1.005 - 1.030   pH 5.0 5.0 - 8.0   Glucose, UA 50 (A) NEGATIVE mg/dL   Hgb urine dipstick SMALL (A) NEGATIVE   Bilirubin Urine NEGATIVE NEGATIVE   Ketones, ur NEGATIVE NEGATIVE mg/dL   Protein, ur NEGATIVE NEGATIVE mg/dL   Nitrite NEGATIVE NEGATIVE   Leukocytes,Ua NEGATIVE NEGATIVE   WBC, UA 0-5 0 - 5 WBC/hpf   Bacteria, UA RARE (A) NONE SEEN   Squamous Epithelial / LPF 0-5 0 - 5   Mucus PRESENT     Comment: Performed at [facility], [place] [address] [place], [place] [address]  Sodium, urine, random     Status: None   Collection Time: 05/15/20  6:14 PM  Result Value Ref Range   Sodium, Ur 23 mmol/L    Comment: Performed at [facility], [place] [address], [place], [place] [address]  Blood Culture (routine x 2)     Status: None (Preliminary result)   Collection Time: 05/15/20  6:30 PM   Specimen: BLOOD  Result Value Ref Range   Specimen Description      BLOOD LEFT ANTECUBITAL Performed at [facility], [place] [address] [place], [place] [address]    Special Requests      BOTTLES DRAWN AEROBIC AND ANAEROBIC Blood Culture results may not be optimal due to an excessive volume of blood received in culture bottles Performed at [place] [address], [place], [place] [address]    Culture      NO GROWTH < 12 HOURS Performed at [place] [address] [place], [place] [address]  Report Status PENDING   SARS Coronavirus 2 by RT PCR  (hospital order, performed in [facility] hospital lab) Nasopharyngeal Nasopharyngeal Swab     Status: None   Collection Time: 05/15/20  6:43 PM   Specimen: Nasopharyngeal Swab  Result Value Ref Range   SARS Coronavirus 2 NEGATIVE NEGATIVE    Comment: (NOTE) SARS-CoV-2 target nucleic acids are NOT DETECTED.  The SARS-CoV-2 RNA is generally detectable in upper and lower respiratory specimens during the acute phase of infection. The lowest concentration of SARS-CoV-2 viral copies this assay can detect is 250 copies / mL. A negative result does not preclude SARS-CoV-2 infection and should not be used as the sole basis for treatment or other patient management decisions.  A negative result may occur with improper specimen collection / handling, submission of specimen other than nasopharyngeal swab, presence of viral mutation(s) within the areas targeted by this assay, and inadequate number of viral copies (<250 copies / mL). A negative result must be combined with clinical observations, patient history, and epidemiological information.  Fact Sheet for Patients:   StrictlyIdeas.no  Fact Sheet for Healthcare Providers: BankingDealers.co.za  This test is not yet approved or  cleared by the [place] FDA and has been authorized for detection and/or diagnosis of SARS-CoV-2 by FDA under an Emergency Use Authorization (EUA).  This EUA will remain in effect (meaning this test can be used) for the duration of the COVID-19 declaration under Section 564(b)(1) of the Act, 21 U.S.C. section 360bbb-3(b)(1), unless the authorization is terminated or revoked sooner.  Performed at [facility], [place] [address], [place], [place] [address]   CBG monitoring, ED     Status: Abnormal   Collection Time: 05/15/20  9:48 PM  Result Value Ref Range   Glucose-Capillary 114 (H) 70 - 99 mg/dL    Comment: Glucose reference range applies only  to samples taken after fasting for at least 8 hours.  Basic metabolic panel     Status: Abnormal   Collection Time: 05/16/20  4:44 AM  Result Value Ref Range   Sodium 139 135 - 145 mmol/L   Potassium 4.1 3.5 - 5.1 mmol/L   Chloride 107 98 - 111 mmol/L   CO2 23 22 - 32 mmol/L   Glucose, Bld 111 (H) 70 - 99 mg/dL    Comment: Glucose reference range applies only to samples taken after fasting for at least 8 hours.   BUN 28 (H) 8 - 23 mg/dL   Creatinine, Ser 2.51 (H) 0.61 - 1.24 mg/dL   Calcium 9.1 8.9 - 10.3 mg/dL   GFR calc non Af Amer 26 (L) >60 mL/min   GFR calc Af Amer 30 (L) >60 mL/min   Anion gap 9 5 - 15    Comment: Performed at [facility] [place], [place] [address], [place], [place] [address]  CBC     Status: Abnormal   Collection Time: 05/16/20  4:44 AM  Result Value Ref Range   WBC 8.4 4.0 - 10.5 K/uL   RBC 3.14 (L) 4.22 - 5.81 MIL/uL   Hemoglobin 9.9 (L) 13.0 - 17.0 g/dL   HCT 28.0 (L) 39 - 52 %   MCV 89.2 80.0 - 100.0 fL   MCH 31.5 26.0 - 34.0 pg   MCHC 35.4 30.0 - 36.0 g/dL   RDW 12.4 11.5 - 15.5 %   Platelets 306 150 - 400 K/uL   nRBC 0.0 0.0 - 0.2 %    Comment: Performed at [facility]  [place] [address] [place], [place] [address]  CBG monitoring, ED     Status: Abnormal   Collection Time: 05/16/20  8:10 AM  Result Value Ref Range   Glucose-Capillary 127 (H) 70 - 99 mg/dL    Comment: Glucose reference range applies only to samples taken after fasting for at least 8 hours.  CBG monitoring, ED     Status: Abnormal   Collection Time: 05/16/20 11:39 AM  Result Value Ref Range   Glucose-Capillary 134 (H) 70 - 99 mg/dL    Comment: Glucose reference range applies only to samples taken after fasting for at least 8 hours.  CK     Status: Abnormal   Collection Time: 05/16/20 12:20 PM  Result Value Ref Range   Total CK 37 (L) 49.0 - 397.0 U/L    Comment: Performed at [facility], [place] [address], [place], [place] [address]   Glucose, capillary     Status: Abnormal   Collection Time: 05/16/20  4:32 PM  Result Value Ref Range   Glucose-Capillary 130 (H) 70 - 99 mg/dL    Comment: Glucose reference range applies only to samples taken after fasting for at least 8 hours.    No results found.  ROS Blood pressure (!) 144/74, pulse 71, temperature 98.1 F (36.7 C), temperature source Oral, resp. rate 16, height 6' (1.829 m), weight 96.6 kg, SpO2 99 %.  Vitals:   05/16/20 1330 05/16/20 1416  BP: 137/66 (!) 144/74  Pulse: 65 71  Resp:  16  Temp:  98.1 F (36.7 C)  SpO2: 98% 99%    General AA&O x3. Normal mood and affect.  Vascular Dorsalis pedis and posterior tibial pulses  present 2+ right  Capillary refill normal to all digits. Pedal hair growth diminished.  Neurologic Epicritic sensation grossly absent.  Dermatologic (Wound) Wound Location: right great toe Wound Base: Fibrotic slough Peri-wound: Reddened Exudate: Moderate amount Purulent exudate Wound probes to bone. Erythema present to the toe  Orthopedic: Motor intact BLE.    Assessment/Plan:  Osteomyelitis right great toe -Imaging: Studies independently reviewed. Needs new XR prior to surgery. Order placed. -Antibiotics: Continue empiric. Will de-escalate after cultures -WB Status: WBAT in surgical shoe. -Wound Care: Betadine WTD to R great toe, daily. -Surgical Plan: discussed with patient surgical options including debridement, partial phalanx resection with bone biopsy vs partial/total toe amputation. Discussed that incomplete removal of infected bone would require prolonged abx which depending upon abx could cause continued renal issues. Patient would like to avoid this and opts for amputation. Discussed r/b/a at length. Plan for amputation tomorrow. Orders placed.  Evelina Bucy 05/16/2020, 9:06 PM   Best available via secure chat for questions or concerns.

## 2020-05-16 NOTE — Telephone Encounter (Signed)
Dr.Lancaster from [facility] and would like to know what the plan would be for this pt stated that this pt was seen 2wks ago and the pt is back in the ED

## 2020-05-16 NOTE — Progress Notes (Signed)
Pharmacy Antibiotic Note  [name] is a 64 y.o. male admitted on 05/15/2020 with acute kidney injury secondary to Vancomycin.  He is currently on 6 wks IV antibiotic course (thru 06/17/20) for osteomyelitis of toe.   Scr has doubled in past week since starting Vancomycin + Rocephin.  Baseline Scr ~ 1.1-1.2.  He was told to stop these yesterday (last dose 6/29) due to worsening renal function and presents today with worsening cellulitis/swelling of foot. Pharmacy has been consulted for Zosyn dosing.  Now adding daptomycin for MRSA coverage.  Approved by ID.    05/16/2020: -Slight improvement in renal fxn -Afebrile -WBC WNL -Baseline CK: 37  Plan: Add Daptomycin 750 mg IV q24h (8mg /kg)  Zosyn 3.375g IV q8h (4 hour infusion).  F/U renal function. Weekly CK while on daptomycin.  Height: 6' (182.9 cm) Weight: 96.6 kg (213 lb) IBW/kg (Calculated) : 77.6  Temp (24hrs), Avg:98.7 F (37.1 C), Min:98.6 F (37 C), Max:98.9 F (37.2 C)  Recent Labs  Lab 05/15/20 1614 05/16/20 0444  WBC 10.8* 8.4  CREATININE 2.56* 2.51*  LATICACIDVEN 1.1  --     Estimated Creatinine Clearance: 34.9 mL/min (A) (by C-G formula based on SCr of 2.51 mg/dL (H)).    No Known Allergies  Antimicrobials this admission: 7/1 Zosyn >>  7/2 Daptomycin  Dose adjustments this admission:  Microbiology results: 7/1BCx: NGTD 7/1 UCx:  Previous admission:  6/22 BCx: NGTD  Thank you for allowing pharmacy to be a part of this patient's care.  Netta Cedars PharmD 05/16/2020 11:42 AM

## 2020-05-16 NOTE — Plan of Care (Signed)

## 2020-05-17 ENCOUNTER — Inpatient Hospital Stay (HOSPITAL_COMMUNITY): Payer: PPO | Admitting: Anesthesiology

## 2020-05-17 ENCOUNTER — Encounter (HOSPITAL_COMMUNITY)
Admission: EM | Disposition: A | Discharge status: Home / Self Care | Payer: Self-pay | Source: Home / Self Care | Attending: Internal Medicine

## 2020-05-17 DIAGNOSIS — L97514 Non-pressure chronic ulcer of other part of right foot with necrosis of bone: Secondary | ICD-10-CM

## 2020-05-17 DIAGNOSIS — M869 Osteomyelitis, unspecified: Secondary | ICD-10-CM

## 2020-05-17 DIAGNOSIS — E08621 Diabetes mellitus due to underlying condition with foot ulcer: Secondary | ICD-10-CM

## 2020-05-17 HISTORY — PX: AMPUTATION TOE: SHX6595

## 2020-05-17 LAB — GLUCOSE, CAPILLARY
Glucose-Capillary: 110 mg/dL — ABNORMAL HIGH (ref 70–99)
Glucose-Capillary: 124 mg/dL — ABNORMAL HIGH (ref 70–99)
Glucose-Capillary: 127 mg/dL — ABNORMAL HIGH (ref 70–99)
Glucose-Capillary: 175 mg/dL — ABNORMAL HIGH (ref 70–99)
Glucose-Capillary: 155 mg/dL — ABNORMAL HIGH (ref 70–99)

## 2020-05-17 LAB — CBC
HCT: 28.1 % — ABNORMAL LOW (ref 39.0–52.0)
Hemoglobin: 9.4 g/dL — ABNORMAL LOW (ref 13.0–17.0)
MCH: 30.5 pg (ref 26.0–34.0)
MCHC: 33.5 g/dL (ref 30.0–36.0)
MCV: 91.2 fL (ref 80.0–100.0)
Platelets: 302 10*3/uL (ref 150–400)
RBC: 3.08 MIL/uL — ABNORMAL LOW (ref 4.22–5.81)
RDW: 12.4 % (ref 11.5–15.5)
WBC: 9.3 10*3/uL (ref 4.0–10.5)
nRBC: 0 % (ref 0.0–0.2)

## 2020-05-17 LAB — COMPREHENSIVE METABOLIC PANEL
ALT: 13 U/L (ref 0–44)
AST: 12 U/L — ABNORMAL LOW (ref 15–41)
Albumin: 2.8 g/dL — ABNORMAL LOW (ref 3.5–5.0)
Alkaline Phosphatase: 48 U/L (ref 38–126)
Anion gap: 6 (ref 5–15)
BUN: 21 mg/dL (ref 8–23)
CO2: 25 mmol/L (ref 22–32)
Calcium: 8.5 mg/dL — ABNORMAL LOW (ref 8.9–10.3)
Chloride: 105 mmol/L (ref 98–111)
Creatinine, Ser: 2.57 mg/dL — ABNORMAL HIGH (ref 0.61–1.24)
GFR calc Af Amer: 29 mL/min — ABNORMAL LOW (ref >60)
GFR calc non Af Amer: 25 mL/min — ABNORMAL LOW (ref >60)
Glucose, Bld: 144 mg/dL — ABNORMAL HIGH (ref 70–99)
Potassium: 3.9 mmol/L (ref 3.5–5.1)
Sodium: 136 mmol/L (ref 135–145)
Total Bilirubin: 0.4 mg/dL (ref 0.3–1.2)
Total Protein: 5.8 g/dL — ABNORMAL LOW (ref 6.5–8.1)

## 2020-05-17 LAB — URINE CULTURE: Culture: NO GROWTH

## 2020-05-17 LAB — SURGICAL PCR SCREEN
MRSA, PCR: NEGATIVE
Staphylococcus aureus: NEGATIVE

## 2020-05-17 SURGERY — AMPUTATION, TOE
Anesthesia: Monitor Anesthesia Care | Site: Toe | Laterality: Right

## 2020-05-17 MED ORDER — FENTANYL CITRATE (PF) 100 MCG/2ML IJ SOLN
INTRAMUSCULAR | Status: AC
  Filled 2020-05-17: qty 2

## 2020-05-17 MED ORDER — VANCOMYCIN HCL 1000 MG IV SOLR
INTRAVENOUS | Status: DC | PRN
  Administered 2020-05-17: 1000 mg via TOPICAL

## 2020-05-17 MED ORDER — LIDOCAINE 2% (20 MG/ML) 5 ML SYRINGE
INTRAMUSCULAR | Status: AC
  Filled 2020-05-17: qty 5

## 2020-05-17 MED ORDER — 0.9 % SODIUM CHLORIDE (POUR BTL) OPTIME
TOPICAL | Status: DC | PRN
  Administered 2020-05-17: 1000 mL

## 2020-05-17 MED ORDER — PROPOFOL 500 MG/50ML IV EMUL
INTRAVENOUS | Status: DC | PRN
  Administered 2020-05-17 (×4): 30 mg via INTRAVENOUS
  Administered 2020-05-17: 20 mg via INTRAVENOUS
  Administered 2020-05-17: 30 mg via INTRAVENOUS
  Administered 2020-05-17: 20 mg via INTRAVENOUS
  Administered 2020-05-17 (×3): 30 mg via INTRAVENOUS
  Administered 2020-05-17: 20 mg via INTRAVENOUS

## 2020-05-17 MED ORDER — EPHEDRINE 5 MG/ML INJ
INTRAVENOUS | Status: AC
  Filled 2020-05-17: qty 10

## 2020-05-17 MED ORDER — VANCOMYCIN HCL 1000 MG IV SOLR
INTRAVENOUS | Status: AC
  Filled 2020-05-17: qty 1000

## 2020-05-17 MED ORDER — SODIUM CHLORIDE 0.9 % IR SOLN
Status: DC | PRN
  Administered 2020-05-17: 4000 mL

## 2020-05-17 MED ORDER — FENTANYL CITRATE (PF) 100 MCG/2ML IJ SOLN: 25.0000 ug | INTRAMUSCULAR | Status: DC | PRN

## 2020-05-17 MED ORDER — FENTANYL CITRATE (PF) 100 MCG/2ML IJ SOLN
INTRAMUSCULAR | Status: DC | PRN
  Administered 2020-05-17: 25 ug via INTRAVENOUS

## 2020-05-17 MED ORDER — BUPIVACAINE HCL (PF) 0.5 % IJ SOLN
INTRAMUSCULAR | Status: DC | PRN
  Administered 2020-05-17: 10 mL

## 2020-05-17 MED ORDER — ONDANSETRON HCL 4 MG/2ML IJ SOLN
INTRAMUSCULAR | Status: AC
  Filled 2020-05-17: qty 4

## 2020-05-17 MED ORDER — ACETAMINOPHEN 10 MG/ML IV SOLN: 1000.0000 mg | Freq: Once | INTRAVENOUS | Status: DC | PRN

## 2020-05-17 MED ORDER — ONDANSETRON HCL 4 MG/2ML IJ SOLN
4.0000 mg | Freq: Once | INTRAMUSCULAR | Status: AC | PRN
  Administered 2020-05-17: 4 mg via INTRAVENOUS

## 2020-05-17 MED ORDER — DEXAMETHASONE SODIUM PHOSPHATE 10 MG/ML IJ SOLN
INTRAMUSCULAR | Status: AC
  Filled 2020-05-17: qty 2

## 2020-05-17 MED ORDER — BUPIVACAINE HCL (PF) 0.5 % IJ SOLN
INTRAMUSCULAR | Status: AC
  Filled 2020-05-17: qty 30

## 2020-05-17 MED ORDER — PROPOFOL 500 MG/50ML IV EMUL
INTRAVENOUS | Status: AC
  Filled 2020-05-17: qty 50

## 2020-05-17 MED ORDER — MIDAZOLAM HCL 2 MG/2ML IJ SOLN
INTRAMUSCULAR | Status: AC
  Filled 2020-05-17: qty 2

## 2020-05-17 MED ORDER — MIDAZOLAM HCL 5 MG/5ML IJ SOLN
INTRAMUSCULAR | Status: DC | PRN
  Administered 2020-05-17: 1 mg via INTRAVENOUS

## 2020-05-17 MED ORDER — PHENYLEPHRINE 40 MCG/ML (10ML) SYRINGE FOR IV PUSH (FOR BLOOD PRESSURE SUPPORT)
PREFILLED_SYRINGE | INTRAVENOUS | Status: AC
  Filled 2020-05-17: qty 10

## 2020-05-17 MED ORDER — LACTATED RINGERS IV SOLN: INTRAVENOUS | Status: DC | PRN

## 2020-05-17 SURGICAL SUPPLY — 44 items
BLADE SURG 15 STRL LF DISP TIS (BLADE) ×1 IMPLANT
BLADE SURG 15 STRL SS (BLADE) ×2
BNDG ELASTIC 2 VLCR STRL LF (GAUZE/BANDAGES/DRESSINGS) ×2 IMPLANT
BNDG ELASTIC 4X5.8 VLCR STR LF (GAUZE/BANDAGES/DRESSINGS) ×2 IMPLANT
BNDG ELASTIC 6X5.8 VLCR STR LF (GAUZE/BANDAGES/DRESSINGS) ×2 IMPLANT
CLEANER TIP ELECTROSURG 2X2 (MISCELLANEOUS) ×2 IMPLANT
CNTNR URN SCR LID CUP LEK RST (MISCELLANEOUS) ×1 IMPLANT
CONT SPEC 4OZ STRL OR WHT (MISCELLANEOUS) ×2
COVER MAYO STAND STRL (DRAPES) IMPLANT
COVER SURGICAL LIGHT HANDLE (MISCELLANEOUS) ×2 IMPLANT
COVER WAND RF STERILE (DRAPES) IMPLANT
CUFF TOURN SGL QUICK 24 (TOURNIQUET CUFF) ×2
CUFF TRNQT CYL 24X4X16.5-23 (TOURNIQUET CUFF) ×1 IMPLANT
DECANTER SPIKE VIAL GLASS SM (MISCELLANEOUS) IMPLANT
GAUZE SPONGE 4X4 12PLY STRL (GAUZE/BANDAGES/DRESSINGS) ×2 IMPLANT
GAUZE XEROFORM 1X8 LF (GAUZE/BANDAGES/DRESSINGS) ×2 IMPLANT
GLOVE BIO SURGEON STRL SZ7.5 (GLOVE) ×2 IMPLANT
GLOVE BIOGEL PI IND STRL 8 (GLOVE) ×1 IMPLANT
GLOVE BIOGEL PI INDICATOR 8 (GLOVE) ×1
GOWN STRL REUS W/TWL XL LVL3 (GOWN DISPOSABLE) ×2 IMPLANT
HANDPIECE INTERPULSE COAX TIP (DISPOSABLE) ×2
KIT BASIN (CUSTOM PROCEDURE TRAY) ×2 IMPLANT
KIT TURNOVER KIT A (KITS) IMPLANT
MARKER SKIN DUAL TIP RULER LAB (MISCELLANEOUS) IMPLANT
NEEDLE HYPO 25X1 1.5 SAFETY (NEEDLE) ×2 IMPLANT
PACK ORTHO EXTREMITY (CUSTOM PROCEDURE TRAY) ×2 IMPLANT
PADDING UNDERCAST 2 STRL (CAST SUPPLIES) ×1
PADDING UNDERCAST 2X4 STRL (CAST SUPPLIES) ×1 IMPLANT
PENCIL SMOKE EVACUATOR (MISCELLANEOUS) IMPLANT
SET HNDPC FAN SPRY TIP SCT (DISPOSABLE) ×1 IMPLANT
STAPLER VISISTAT 35W (STAPLE) ×2 IMPLANT
SUT ETHILON 3 0 PS 1 (SUTURE) ×2 IMPLANT
SUT PROLENE 4 0 PS 2 18 (SUTURE) ×2 IMPLANT
SUT VIC AB 1 CT1 27 (SUTURE) ×2
SUT VIC AB 1 CT1 27XBRD ANTBC (SUTURE) ×1 IMPLANT
SUT VIC AB 3-0 PS2 18 (SUTURE) ×2
SUT VIC AB 3-0 PS2 18XBRD (SUTURE) ×1 IMPLANT
SWAB COLLECTION DEVICE MRSA (MISCELLANEOUS) ×2 IMPLANT
SWAB CULTURE ESWAB REG 1ML (MISCELLANEOUS) ×2 IMPLANT
SYR 20ML LL LF (SYRINGE) ×2 IMPLANT
TOWEL OR 17X26 10 PK STRL BLUE (TOWEL DISPOSABLE) ×2 IMPLANT
TOWEL OR NON WOVEN STRL DISP B (DISPOSABLE) ×2 IMPLANT
TRAY PREP A LATEX SAFE STRL (SET/KITS/TRAYS/PACK) ×2 IMPLANT
UNDERPAD 30X36 HEAVY ABSORB (UNDERPADS AND DIAPERS) ×8 IMPLANT

## 2020-05-17 NOTE — Transfer of Care (Signed)
Immediate Anesthesia Transfer of Care Note  Patient: [name]  Procedure(s) Performed: Procedure(s): AMPUTATION TOE partial right great toe (Right)  Patient Location: PACU and   Anesthesia Type:MAC  Level of Consciousness: awake, alert  and oriented  Airway & Oxygen Therapy: Patient Spontanous Breathing and Patient connected to nasal cannula oxygen  Post-op Assessment: Report given to RN and Post -op Vital signs reviewed and stable  Post vital signs: Reviewed and stable  Last Vitals:  Vitals:   05/17/20 0116 05/17/20 0533  BP: 133/77 (!) 155/81  Pulse: 66 75  Resp: 12 16  Temp: 37.1 C 37 C  SpO2: 34% 91%    Complications: No apparent anesthesia complications

## 2020-05-17 NOTE — Op Note (Signed)
Patient Name: [name] DOB: 04-26-1956  MRN: [id number]   Date of Service: 05/17/20  Surgeon: Dr. Hardie Pulley, DPM Assistants: None Pre-operative Diagnosis:  Osteomyelitis Post-operative Diagnosis:  Same Procedures:  1) Amputation right great toe interphalangeal joint Pathology/Specimens: ID Type Source Tests Collected by Time Destination  1 : bone right great toe Tissue PATH Bone biopsy SURGICAL PATHOLOGY Evelina Bucy, DPM 05/17/2020 4801   A : right great toe wound aerobic anaerobic culture Wound Wound AEROBIC/ANAEROBIC CULTURE (SURGICAL/DEEP WOUND) Evelina Bucy, DPM 05/17/2020 0835   B : bone right great toe aerobic anaerobic culture Wound Wound AEROBIC/ANAEROBIC CULTURE (SURGICAL/DEEP WOUND) Evelina Bucy, DPM 05/17/2020 6553    Anesthesia: MAC/local Hemostasis: * No tourniquets in log * Estimated Blood Loss: 15 mL Materials: * No implants in log * Medications: 1g vancomycin topical Complications: none  Indications for Procedure:  This is a 64 y.o. 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  He previously refused amputation and instead wanted to proceed with antibiotics.  Experienced acute kidney injury from antibiotics and was discussed with him further debridement antibiotics for his amputation.  He elected for amputation.  All risk benefits alternatives surgery were discussed no guarantees were given.   Procedure in Detail: Patient was identified in pre-operative holding area. Formal consent was signed and the right lower extremity was marked. Patient was brought back to the operating room. Anesthesia was induced. The extremity was prepped and draped in the usual sterile fashion. Timeout was taken to confirm patient name, laterality, and procedure prior to incision.   Attention was then directed to the right great toe where there was a purulent ulcer.  This was swabbed for culture. An incision was made at the distal aspect of the toe. Dissection  was carried down to level of bone.  Dissection was continued to the distal phalanx.  The bone was soft and necrotic.  The bone was excised and sent for culture.  Dissection was continued down to the interphalangeal joint.  The remainder of the distal phalanx bone was disarticulated and sent with the bone for pathology.  The proximal phalangeal head appeared healthy and viable.  The area was copiously irrigated with 3 L of normal saline.  The wound was then thoroughly excisionally debrided to healthy bleeding tissue with a rongeur.  The wound was then additionally irrigated for 1 L normal saline.  Skin edges were then undermined to create a flap over the proximal phalangeal bone.  Vancomycin powder was applied topically to the wound.  Deep closure was accomplished with 3-0 Vicryl to cover the bone.   Skin edges were remodeled to excise dogears.  The skin was reapproximated with 3-0 nylon and skin staples.   The foot was then dressed with Xeroform 4 x 4 Kling and Ace bandage. Patient tolerated the procedure well.   Disposition: Following a period of post-operative monitoring, patient will be transferred back to the floor.  I do think today's procedure will resolve the bone infection but he will need a short course of antibiotics for the soft tissue component of the infection.  If the wound appears improved tomorrow he will be okay for discharge with p.o. antibiotics.

## 2020-05-17 NOTE — Progress Notes (Signed)
PROGRESS NOTE    [name]  DGU:[id number] DOB: 07-28-1956 DOA: 05/15/2020 PCP: [name], DO   Brief Narrative:  [name] is a 64 y.o. male with medical history significant for stage II CKD, proteinuria, history of difficulty waking up from anesthesia, DJD of lumbar spine, radicular leg pain, lumbar stenosis, insulin-dependent type 2 diabetes, essential hypertension, and OSA noncompliant with CPAP who presents with worsening edema, erythema of his right great toe. Patient has had cellulitis of the right great toe followed by Dr. [name] of podiatry in [facility] in [place] and ultimately required hospitalization at [facility] in June for osteomyelitis of the right great toe.  He was sent home with a PICC line with plans to complete 6 weeks of IV vancomycin and Rocephin (05/10/20-06/17/20). Antibiotics was discontinued yesterday due to worsening renal function seen on routine lab work. Since discontinuation of his antibiotics, he has noticed worsening swelling and edema of the right toe.he is also noticed feeling hot and cold at times but no objective fever. Also has had some nausea and decreased p.o. intake in the last few days.  Assessment & Plan:   Principal Problem:   Osteomyelitis ([place]) Active Problems:   Hypertension associated with diabetes ([place])   Type 2 diabetes mellitus with stage 2 chronic kidney disease, without long-term current use of insulin (HCC)   Type 2 diabetes mellitus (HCC)   AKI (acute kidney injury) (HCC)   OSA (obstructive sleep apnea)  Worsening osteomyelitis of the right great toe with failure of outpatient antibiotics - Previously on IV vancomycin and Rocephin but discontinued in the outpatient setting given worsening renal function. - Spoke with ID, transition to Dapto and Zosyn currently - Podiatry following status post surgery this morning with amputation of osteomyelytic region of the right great toe -soft tissue  continues to have inflammation concerning for ongoing infection - Follow cultures, likely de-escalate pending sensitivities - Repeat Xray of foot shows progressing destruction of R great toe distal phalanx with pathologic fracture and surrounding edema - Had normal ABI on 6/23 bilaterally  Acute on CKD stage II renal injury secondary to IV antibiotics, ongoing - IV antibiotics have been transitioned as above - Will continue IVF while perioperative - Baseline creatinine around 1.1 Lab Results  Component Value Date   CREATININE 2.57 (H) 05/17/2020   CREATININE 2.51 (H) 05/16/2020   CREATININE 2.56 (H) 05/15/2020   Hypertension, essential - Continue amlodipine and clonidine.   - Hold ARB and spironolactone due to AKI.  Insulin-dependent type 2 diabetes - Normally on 28 units a.m. [name] and Metformin - Lantus 20units in the AM and moderate SSI here - Decrease gabapentin to once a day due to worsening renal function  BPH - Continue Finasteride and Flomax  OSA - Noncompliant with CPAP.  Patient discontinued himself 2 years ago.  DVT prophylaxis: Heparin Code Status: Full Family Communication: None present  Status is: Inpatient Dispo: The patient is from: Home              Anticipated d/c is to: Likely home pending clinical course              Anticipated d/c date is: 24-48 hours pending clinical course cultures ending surgical evaluation              Patient currently not medically stable for discharge due to ongoing need for prolonged IV antibiotics, cultures and hopeful debridement of the toe as above.  Consultants:  Podiatry, ID sideline  Procedures:   None planned  Antimicrobials:  Daptomycin, Zosyn as above  Subjective: No acute issues or events overnight, patient tolerating p.o. well, pain currently well controlled.  Denies nausea, vomiting, diarrhea, constipation, headache, fevers, chills.  Objective: Vitals:   05/16/20 2125 05/16/20 2201 05/17/20 0116  05/17/20 0533  BP: (!) 197/78 (!) 159/73 133/77 (!) 155/81  Pulse: (!) 104 88 66 75  Resp: 16  12 16   Temp: 99.3 F (37.4 C)  98.8 F (37.1 C) 98.6 F (37 C)  TempSrc:    Oral  SpO2: 96%  97% 97%  Weight:      Height:        Intake/Output Summary (Last 24 hours) at 05/17/2020 0755 Last data filed at 05/17/2020 0600 Gross per 24 hour  Intake 2511.68 ml  Output --  Net 2511.68 ml   Filed Weights   05/15/20 1518  Weight: 96.6 kg    Examination:  General:  Pleasantly resting in bed, No acute distress. HEENT:  Normocephalic atraumatic.  Sclerae nonicteric, noninjected.  Extraocular movements intact bilaterally. Neck:  Without mass or deformity.  Trachea is midline. Lungs:  Clear to auscultate bilaterally without rhonchi, wheeze, or rales. Heart:  Regular rate and rhythm.  Without murmurs, rubs, or gallops. Abdomen:  Soft, nontender, nondistended.  Without guarding or rebound. Extremities: Without cyanosis, clubbing, edema, or obvious deformity. Vascular:  Dorsalis pedis and posterior tibial pulses palpable bilaterally. Skin:  Warm and dry, no erythema, right foot bandage clean dry intact  Data Reviewed: I have personally reviewed following labs and imaging studies  CBC: Recent Labs  Lab 05/15/20 1614 05/16/20 0444 05/17/20 0322  WBC 10.8* 8.4 9.3  NEUTROABS 7.6  --   --   HGB 11.1* 9.9* 9.4*  HCT 31.4* 28.0* 28.1*  MCV 88.7 89.2 91.2  PLT 311 306 488   Basic Metabolic Panel: Recent Labs  Lab 05/15/20 1614 05/16/20 0444 05/17/20 0322  NA 133* 139 136  K 4.3 4.1 3.9  CL 101 107 105  CO2 22 23 25   GLUCOSE 181* 111* 144*  BUN 30* 28* 21  CREATININE 2.56* 2.51* 2.57*  CALCIUM 9.3 9.1 8.5*   GFR: Estimated Creatinine Clearance: 34.1 mL/min (A) (by C-G formula based on SCr of 2.57 mg/dL (H)). Liver Function Tests: Recent Labs  Lab 05/15/20 1614 05/17/20 0322  AST 15 12*  ALT 21 13  ALKPHOS 72 48  BILITOT 0.8 0.4  PROT 7.2 5.8*  ALBUMIN 3.5 2.8*   No  results for input(s): LIPASE, AMYLASE in the last 168 hours. No results for input(s): AMMONIA in the last 168 hours. Coagulation Profile: Recent Labs  Lab 05/15/20 1753  INR 1.0   Cardiac Enzymes: Recent Labs  Lab 05/16/20 1220  CKTOTAL 37*   BNP (last 3 results) No results for input(s): PROBNP in the last 8760 hours. HbA1C: No results for input(s): HGBA1C in the last 72 hours. CBG: Recent Labs  Lab 05/16/20 0810 05/16/20 1139 05/16/20 1632 05/16/20 2201 05/17/20 0735  GLUCAP 127* 134* 130* 169* 110*   Lipid Profile: No results for input(s): CHOL, HDL, LDLCALC, TRIG, CHOLHDL, LDLDIRECT in the last 72 hours. Thyroid Function Tests: No results for input(s): TSH, T4TOTAL, FREET4, T3FREE, THYROIDAB in the last 72 hours. Anemia Panel: No results for input(s): VITAMINB12, FOLATE, FERRITIN, TIBC, IRON, RETICCTPCT in the last 72 hours. Sepsis Labs: Recent Labs  Lab 05/15/20 1614  LATICACIDVEN 1.1    Recent Results (from the past 240 hour(s))  Blood Culture (routine x 2)     Status: None (Preliminary result)   Collection Time: 05/15/20  6:30 PM   Specimen: BLOOD  Result Value Ref Range Status   Specimen Description   Final    BLOOD LEFT ANTECUBITAL Performed at [facility] [address] [place], [place] [address]    Special Requests   Final    BOTTLES DRAWN AEROBIC AND ANAEROBIC Blood Culture results may not be optimal due to an excessive volume of blood received in culture bottles Performed at [facility] [address], [place], [place] [address]    Culture   Final    NO GROWTH < 12 HOURS Performed at [facility] [address] [place],  [address]    Report Status PENDING  Incomplete  SARS Coronavirus 2 by RT PCR (hospital order, performed in [facility] hospital lab) Nasopharyngeal Nasopharyngeal Swab     Status: None   Collection Time: 05/15/20  6:43 PM   Specimen: Nasopharyngeal Swab  Result Value  Ref Range Status   SARS Coronavirus 2 NEGATIVE NEGATIVE Final    Comment: (NOTE) SARS-CoV-2 target nucleic acids are NOT DETECTED.  The SARS-CoV-2 RNA is generally detectable in upper and lower respiratory specimens during the acute phase of infection. The lowest concentration of SARS-CoV-2 viral copies this assay can detect is 250 copies / mL. A negative result does not preclude SARS-CoV-2 infection and should not be used as the sole basis for treatment or other patient management decisions.  A negative result may occur with improper specimen collection / handling, submission of specimen other than nasopharyngeal swab, presence of viral mutation(s) within the areas targeted by this assay, and inadequate number of viral copies (<250 copies / mL). A negative result must be combined with clinical observations, patient history, and epidemiological information.  Fact Sheet for Patients:   StrictlyIdeas.no  Fact Sheet for Healthcare Providers: BankingDealers.co.za  This test is not yet approved or  cleared by the [place] FDA and has been authorized for detection and/or diagnosis of SARS-CoV-2 by FDA under an Emergency Use Authorization (EUA).  This EUA will remain in effect (meaning this test can be used) for the duration of the COVID-19 declaration under Section 564(b)(1) of the Act, 21 U.S.C. section 360bbb-3(b)(1), unless the authorization is terminated or revoked sooner.  Performed at [facility], [place] [address] [place],  [address]      Radiology Studies: DG Foot Complete Right  Result Date: 05/16/2020 CLINICAL DATA:  Osteomyelitis. Technologist notes state surgery on big toe planned for tomorrow. EXAM: RIGHT FOOT COMPLETE - 3+ VIEW COMPARISON:  Radiograph and MRI 05/07/2020 FINDINGS: Progressive bony destruction of the great toe distal phalanx from prior exam. There is a pathologic fracture that  extends to the interphalangeal joint. Overlying dressing in place with soft tissue edema. No radiopaque foreign body. No abnormal density of the great toe proximal phalanx. Hammertoe deformity of the digits without bony destructive change. Generalized dorsal soft tissue edema. There is a plantar calcaneal spur and Achilles tendon enthesophyte. Vascular calcifications. IMPRESSION: 1. Progressive bony destruction of the great toe distal phalanx from prior exam consistent with osteomyelitis. Pathologic fracture through the distal phalanx that extends to the interphalangeal joint. 2. Generalized dorsal soft tissue edema. Electronically Signed   By: Keith Rake M.D.   On: 05/16/2020 22:03    Scheduled Meds: . amLODipine  10 mg Oral Daily  . Chlorhexidine Gluconate Cloth  6 each  Topical Daily  . cloNIDine  0.3 mg Oral BID  . finasteride  5 mg Oral Daily  . gabapentin  100 mg Oral Daily  . heparin  5,000 Units Subcutaneous Q8H  . insulin aspart  0-15 Units Subcutaneous TID WC  . insulin aspart  0-5 Units Subcutaneous QHS  . insulin glargine  20 Units Subcutaneous Daily  . tamsulosin  0.4 mg Oral QPC supper   Continuous Infusions: . sodium chloride 75 mL/hr at 05/16/20 1415  . DAPTOmycin (CUBICIN)  IV 750 mg (05/16/20 1547)  . piperacillin-tazobactam (ZOSYN)  IV 12.5 mL/hr at 05/17/20 0200     LOS: 2 days   Time spent: 52min  Dalaina Tates C Bartolo Montanye, DO [facility]  If 7PM-7AM, please contact night-coverage [email or web address]  05/17/2020, 7:55 AM

## 2020-05-17 NOTE — Progress Notes (Signed)
  Subjective:  Patient ID: [name], male    DOB: [birth year],  MRN: [id number]  Patient seen in pre-op. Denies complaints this AM. Understands plan for the OR today. Objective:   Vitals:   05/17/20 0116 05/17/20 0533  BP: 133/77 (!) 155/81  Pulse: 66 75  Resp: 12 16  Temp: 98.8 F (37.1 C) 98.6 F (37 C)  SpO2: 97% 97%   General AA&O x3. Normal mood and affect.  Vascular Dorsalis pedis and posterior tibial pulses  present 2+ right  Capillary refill normal to all digits. Pedal hair growth diminished.  Neurologic Epicritic sensation grossly absent.  Dermatologic (Wound) Wound Location: right great toe Wound Base: Fibrotic slough Peri-wound: Reddened Exudate: Moderate amount Purulent exudate Wound probes to bone. Erythema present to the toe  Orthopedic: Motor intact BLE.   Assessment & Plan:  Patient was evaluated and treated and all questions answered.  Osteomyelitis right great toe -Antibiotics: Continue empiric. Will de-escalate after cultures -WB Status: WBAT in surgical shoe. -Wound Care: Betadine WTD to R great toe, daily. -Patient agreed with plan to proceed to OR for amputation. Proceed this morning. XR taken were reviewed last night bedside as they were being taken with patient. They do show pathologic fracture to the joint line. Unclear if there is infection at this area. Discussed possibility of partial amputation today with planned return to OR for delayed closure if necessary given extent of bone/soft tissue infection. Patient amenable. -Proceed to OR today for amputation right great toe, partial vs total.   Evelina Bucy, DPM  Accessible via secure chat for questions or concerns.

## 2020-05-17 NOTE — Progress Notes (Signed)
Unable to obtain G2 suppliment for ERAS protocol this shift.

## 2020-05-17 NOTE — Anesthesia Postprocedure Evaluation (Signed)
Anesthesia Post Note  Patient: [name]  Procedure(s) Performed: AMPUTATION TOE partial right great toe (Right Toe)     Patient location during evaluation: PACU Anesthesia Type: MAC Level of consciousness: awake and alert Pain management: pain level controlled Vital Signs Assessment: post-procedure vital signs reviewed and stable Respiratory status: spontaneous breathing, nonlabored ventilation, respiratory function stable and patient connected to nasal cannula oxygen Cardiovascular status: stable and blood pressure returned to baseline Postop Assessment: no apparent nausea or vomiting Anesthetic complications: no   No complications documented.  Last Vitals:  Vitals:   05/17/20 0915 05/17/20 0930  BP: 125/67 (!) 143/70  Pulse: 63 64  Resp: 12 14  Temp: 36.8 C   SpO2: 97% 96%    Last Pain:  Vitals:   05/17/20 0930  TempSrc:   PainSc: 0-No pain                 Barnet Glasgow

## 2020-05-18 ENCOUNTER — Inpatient Hospital Stay (HOSPITAL_COMMUNITY): Payer: PPO

## 2020-05-18 ENCOUNTER — Other Ambulatory Visit: Payer: Self-pay | Admitting: Podiatry

## 2020-05-18 LAB — CBC
HCT: 27.3 % — ABNORMAL LOW (ref 39.0–52.0)
Hemoglobin: 9.1 g/dL — ABNORMAL LOW (ref 13.0–17.0)
MCH: 30.5 pg (ref 26.0–34.0)
MCHC: 33.3 g/dL (ref 30.0–36.0)
MCV: 91.6 fL (ref 80.0–100.0)
Platelets: 302 10*3/uL (ref 150–400)
RBC: 2.98 MIL/uL — ABNORMAL LOW (ref 4.22–5.81)
RDW: 12.4 % (ref 11.5–15.5)
WBC: 10.9 10*3/uL — ABNORMAL HIGH (ref 4.0–10.5)
nRBC: 0 % (ref 0.0–0.2)

## 2020-05-18 LAB — COMPREHENSIVE METABOLIC PANEL
ALT: 11 U/L (ref 0–44)
AST: 12 U/L — ABNORMAL LOW (ref 15–41)
Albumin: 2.8 g/dL — ABNORMAL LOW (ref 3.5–5.0)
Alkaline Phosphatase: 46 U/L (ref 38–126)
Anion gap: 10 (ref 5–15)
BUN: 19 mg/dL (ref 8–23)
CO2: 24 mmol/L (ref 22–32)
Calcium: 8.5 mg/dL — ABNORMAL LOW (ref 8.9–10.3)
Chloride: 105 mmol/L (ref 98–111)
Creatinine, Ser: 2.41 mg/dL — ABNORMAL HIGH (ref 0.61–1.24)
GFR calc Af Amer: 31 mL/min — ABNORMAL LOW (ref >60)
GFR calc non Af Amer: 27 mL/min — ABNORMAL LOW (ref >60)
Glucose, Bld: 126 mg/dL — ABNORMAL HIGH (ref 70–99)
Potassium: 4.1 mmol/L (ref 3.5–5.1)
Sodium: 139 mmol/L (ref 135–145)
Total Bilirubin: 0.5 mg/dL (ref 0.3–1.2)
Total Protein: 5.7 g/dL — ABNORMAL LOW (ref 6.5–8.1)

## 2020-05-18 LAB — GLUCOSE, CAPILLARY
Glucose-Capillary: 110 mg/dL — ABNORMAL HIGH (ref 70–99)
Glucose-Capillary: 149 mg/dL — ABNORMAL HIGH (ref 70–99)

## 2020-05-18 MED ORDER — CLINDAMYCIN HCL 300 MG PO CAPS
300.0000 mg | ORAL_CAPSULE | Freq: Three times a day (TID) | ORAL | 0 refills | Status: DC
Start: 2020-05-18 — End: 2020-05-18

## 2020-05-18 MED ORDER — LACTINEX PO CHEW
1.0000 | CHEWABLE_TABLET | Freq: Three times a day (TID) | ORAL | 0 refills | Status: DC
Start: 2020-05-18 — End: 2020-06-18

## 2020-05-18 MED ORDER — HYDROCODONE-ACETAMINOPHEN 5-325 MG PO TABS: 1.0000 | ORAL_TABLET | ORAL | 0 refills | Status: DC | PRN

## 2020-05-18 MED ORDER — CLINDAMYCIN HCL 300 MG PO CAPS
300.0000 mg | ORAL_CAPSULE | Freq: Three times a day (TID) | ORAL | 0 refills | Status: AC
Start: 2020-05-18 — End: 2020-05-28

## 2020-05-18 MED ORDER — ONDANSETRON HCL 4 MG PO TABS: 4.0000 mg | ORAL_TABLET | Freq: Three times a day (TID) | ORAL | 0 refills | Status: DC | PRN

## 2020-05-18 MED ORDER — CLINDAMYCIN HCL 300 MG PO CAPS
300.0000 mg | ORAL_CAPSULE | Freq: Four times a day (QID) | ORAL | Status: DC
  Administered 2020-05-18: 300 mg via ORAL
  Filled 2020-05-18 (×2): qty 1

## 2020-05-18 NOTE — Plan of Care (Signed)
Education provided to patient and son.   All questions answered.   Education provided about PO antibiotics.    [place], Therapist, sports

## 2020-05-18 NOTE — Discharge Summary (Signed)
Physician Discharge Summary  [name] TIW:[id number] DOB: 04/27/1956 DOA: 05/15/2020  PCP: [name], DO  Admit date: 05/15/2020 Discharge date: 05/18/2020  Admitted From: Home Disposition: Home  Recommendations for Outpatient Follow-up:  1. Follow up with podiatry in 1-2 weeks 2. Please obtain BMP/CBC within one week 3. Please follow up with PCP in the next 72 to 96 hours to follow-up on improvement in creatinine and kidney function  Discharge Condition: Stable CODE STATUS: Full Diet recommendation: As tolerated diabetic diet  Brief/Interim Summary: Clifford Austria Wilsonis a 64 y.o.malewith medical history significant forstage II CKD, proteinuria, history of difficulty waking up from anesthesia, DJD of lumbar spine, radicular leg pain, lumbar stenosis,insulin-dependenttype 2 diabetes, essential hypertension, and OSA noncompliant with CPAP who presents with worsening edema, erythema of his right great toe. Patient has had cellulitis of the right great toe [name] [place] [facility] June forosteomyelitis of the right great toe. He was sent home with a PICC line withplans to complete 6 weeks of IV vancomycin and Rocephin (05/10/20-06/17/20). Antibiotics was discontinuedyesterdaydue to worsening renal functionseen on routine lab work. Since discontinuation of his antibiotics, he has noticed worsening swelling andedemaof the right toe.he is also noticed feeling hot and cold at times but no objective fever. Also has had some nausea and decreased p.o. intake in the last few days.  Patient admitted as above after essentially failing outpatient IV antibiotics for treatment of right great toe osteomyelitis.  Patient had increasing creatinine as outlined above with worsening infection and inflammation after holding IV antibiotics due to kidney function.  Patient  evaluated by podiatry, Dr. March Rummage evaluated the patient and they agreed that the best course of action at this time would be amputation and debridement.  During procedure podiatry indicates that the entirety of the infected bone has been removed but there was some additional soft tissue swelling and concern for infection in the area as such patient has been transitioned to p.o. clindamycin and all IV antibiotics have been stopped.  Patient's creatinine continues to improve with p.o. intake and IV fluids.  Given cessation of all IV antibiotics and no further offending nephrotoxic medications as patient's ARB and spironolactone have been held at discharge patient is otherwise stable and agreeable for discharge home.  We discussed the need for ongoing p.o. intake, increased free water intake to ensure kidney function returns back to baseline.  Patient has been instructed to follow-up with PCP in 48 to 72 hours for repeat labs and further evaluation per their expertise.  PICC line was removed prior to discharge.  Follow-up with podiatry as previously scheduled.   Discharge Diagnoses:  Principal Problem:   Osteomyelitis ([place]) Active Problems:   Hypertension associated with diabetes ([place])   Type 2 diabetes mellitus with stage 2 chronic kidney disease, without long-term current use of insulin (HCC)   Type 2 diabetes mellitus (HCC)   AKI (acute kidney injury) (HCC)   OSA (obstructive sleep apnea)   Osteomyelitis of great toe of right foot (HCC)   Diabetic ulcer of toe of right foot associated with diabetes mellitus due to underlying condition, with necrosis of bone [facility])    Discharge Instructions  Discharge Instructions    Diet - low sodium heart healthy   Complete by: As directed    Discharge wound care:   Complete by: As directed    Per podiatry   Increase activity slowly  Complete by: As directed      Allergies as of 05/18/2020   No Known Allergies     Medication List    STOP taking  these medications   cefTRIAXone  IVPB Commonly known as: ROCEPHIN   irbesartan 300 MG tablet Commonly known as: AVAPRO   spironolactone 25 MG tablet Commonly known as: ALDACTONE   vancomycin  IVPB     TAKE these medications   amLODipine 10 MG tablet Commonly known as: NORVASC Take 1 tablet (10 mg total) by mouth daily.   cadexomer iodine 0.9 % gel Commonly known as: IODOSORB Apply 1 application topically daily as needed for wound care.   cholecalciferol 25 MCG (1000 UNIT) tablet Commonly known as: VITAMIN D3 Take 2,000 Units by mouth daily.   clindamycin 300 MG capsule Commonly known as: CLEOCIN Take 1 capsule (300 mg total) by mouth 3 (three) times daily for 10 days.   cloNIDine 0.3 MG tablet Commonly known as: CATAPRES Take 1 tablet (0.3 mg total) by mouth 2 (two) times daily.   finasteride 5 MG tablet Commonly known as: PROSCAR Take 1 tablet (5 mg total) by mouth daily.   gabapentin 100 MG capsule Commonly known as: NEURONTIN Take 1 capsule (100 mg total) by mouth 3 (three) times daily. What changed: when to take this   HYDROcodone-acetaminophen 5-325 MG tablet Commonly known as: NORCO/VICODIN Take 1 tablet by mouth every 4 (four) hours as needed for moderate pain.   lactobacillus acidophilus & bulgar chewable tablet Chew 1 tablet by mouth 3 (three) times daily with meals. Notes to patient: This medication was not ordered for you as an inpatient. Please start with dinner tonight.   metFORMIN 500 MG 24 hr tablet Commonly known as: GLUCOPHAGE-XR Take 2 tablets (1,000 mg total) by mouth 2 (two) times daily. TAKE  (1)  TABLET TWICE A DAY. What changed:   how much to take  additional instructions   multivitamin with minerals Tabs tablet Take 1 tablet by mouth daily.   ondansetron 4 MG tablet Commonly known as: Zofran Take 1 tablet (4 mg total) by mouth every 8 (eight) hours as needed for nausea or vomiting.   saccharomyces boulardii 250 MG  capsule Commonly known as: Florastor Take 1 capsule (250 mg total) by mouth 2 (two) times daily.   Santyl ointment Generic drug: collagenase Apply 1 application topically daily. What changed: additional instructions   tamsulosin 0.4 MG Caps capsule Commonly known as: FLOMAX Take 0.4 mg by mouth daily after supper.   [name] FlexTouch 100 UNIT/ML FlexTouch Pen Generic drug: insulin degludec Inject 0.28 mLs (28 Units total) into the skin every morning. Increase by one unit daily until fasting sugar is 150 What changed: additional instructions            Discharge Care Instructions  (From admission, onward)         Start     Ordered   05/18/20 0000  Discharge wound care:       Comments: Per podiatry   05/18/20 1207          No Known Allergies  Consultations:  Podiatry, Dr. March Rummage   Procedures/Studies: MRI Right foot without contrast  Result Date: 05/07/2020 CLINICAL DATA:  Open wound to right great toe for 3 weeks. Diabetic. EXAM: MRI OF THE RIGHT FOREFOOT WITHOUT CONTRAST TECHNIQUE: Multiplanar, multisequence MR imaging of the right forefoot was performed. No intravenous contrast was administered. COMPARISON:  X-ray 05/07/2020 FINDINGS: Bones/Joint/Cartilage Bone marrow edema with confluent low T1  signal within the distal phalanx of the right great toe compatible with acute osteomyelitis (series 9, image 12). Signal abnormality extends to the base of the distal phalanx. Preservation of the fatty T1 marrow signal within the great toe proximal phalanx. There is a small nonspecific joint effusion at the first metatarsophalangeal joint. Preservation of the bone marrow signal within the remaining visualized forefoot. No acute fracture. No malalignment. Moderate arthropathy of the great toe IP joint. Ligaments Intact Lisfranc ligament. Collateral ligaments of the forefoot appear intact. Muscles and Tendons Mild diffuse edema like signal throughout the intrinsic foot musculature  without focal intramuscular fluid collection. Mild fatty infiltration of the foot musculature suggesting chronic denervation changes. No focal tendinous discontinuity. Mild tenosynovial fluid involving the extensor tendon of the great toe. Soft tissues Diffuse soft tissue swelling and edema at the great toe. Small amount of periosteal fluid overlying the distal tuft (series 8, image 6). Otherwise, no well-defined soft tissue fluid collection. IMPRESSION: 1. Acute osteomyelitis of the distal phalanx of the right great toe. 2. Diffuse soft tissue swelling and edema at the great toe. Small amount of fluid overlies the distal tuft. 3. Small nonspecific joint effusion at the first MTP joint, which may be reactive. 4. Mild tenosynovial fluid involving the extensor tendon of the great toe. 5. Mild diffuse edema-like signal throughout the intrinsic foot musculature suggesting which may represent a nonspecific myositis versus denervation changes. Electronically Signed   By: Davina Poke D.O.   On: 05/07/2020 10:35   US ARTERIAL ABI (SCREENING LOWER EXTREMITY)  Result Date: 05/07/2020 CLINICAL DATA:  Diabetic ulcer of the right first toe. Additional history of hypertension. EXAM: NONINVASIVE PHYSIOLOGIC VASCULAR STUDY OF BILATERAL LOWER EXTREMITIES TECHNIQUE: Evaluation of both lower extremities were performed at rest, including calculation of ankle-brachial indices with single level Doppler, pressure and pulse volume recording. COMPARISON:  None. FINDINGS: Right ABI:  1.15 Left ABI:  1.07 Right Lower Extremity: Posterior tibial and dorsalis pedis waveforms are biphasic and nearly monophasic. Left Lower Extremity: Posterior tibial waveform is biphasic and dorsalis pedis waveform is monophasic. 1.0-1.4 Normal IMPRESSION: Normal resting ankle-brachial indices bilaterally. Biphasic and monophasic distal waveforms bilaterally are consistent with some component of tibial disease. Electronically Signed   By: Aletta Edouard  M.D.   On: 05/07/2020 11:59   DG Foot 2 Views Right  Result Date: 05/18/2020 CLINICAL DATA:  Status post right toe distal phalangeal amputation EXAM: RIGHT FOOT - 2 VIEW COMPARISON:  05/16/2020 FINDINGS: Status post interval amputation of the distal phalanx of the right great toe with expected overlying postoperative changes. IMPRESSION: Interval amputation of the distal phalanx of the right great toe with expected overlying postoperative changes. Electronically Signed   By: Eddie Candle M.D.   On: 05/18/2020 11:39   DG Foot Complete Right  Result Date: 05/16/2020 CLINICAL DATA:  Osteomyelitis. Technologist notes state surgery on big toe planned for tomorrow. EXAM: RIGHT FOOT COMPLETE - 3+ VIEW COMPARISON:  Radiograph and MRI 05/07/2020 FINDINGS: Progressive bony destruction of the great toe distal phalanx from prior exam. There is a pathologic fracture that extends to the interphalangeal joint. Overlying dressing in place with soft tissue edema. No radiopaque foreign body. No abnormal density of the great toe proximal phalanx. Hammertoe deformity of the digits without bony destructive change. Generalized dorsal soft tissue edema. There is a plantar calcaneal spur and Achilles tendon enthesophyte. Vascular calcifications. IMPRESSION: 1. Progressive bony destruction of the great toe distal phalanx from prior exam consistent with osteomyelitis. Pathologic fracture  through the distal phalanx that extends to the interphalangeal joint. 2. Generalized dorsal soft tissue edema. Electronically Signed   By: Keith Rake M.D.   On: 05/16/2020 22:03   DG Foot Complete Right  Result Date: 05/07/2020 Please see detailed radiograph report in office note.  DG Foot Complete Right  Result Date: 05/07/2020 CLINICAL DATA:  Great toe swelling and pain. EXAM: RIGHT FOOT COMPLETE - 3+ VIEW COMPARISON:  None. FINDINGS: There is osteolysis at the tip of the great toe with adjacent periosteal new bone formation. Mild soft  tissue swelling. Bones are otherwise normal. IMPRESSION: Distal right great toe osteomyelitis. Electronically Signed   By: Ulyses Jarred M.D.   On: 05/07/2020 00:51   [place] EKG SITE RITE  Result Date: 05/07/2020 If Site Rite image not attached, placement could not be confirmed due to current cardiac rhythm.     Subjective: No acute issues or events overnight, pain well controlled, ambulating without difficulty tolerating p.o. quite well without any nausea vomiting diarrhea constipation headache fevers or chills.   Discharge Exam: Vitals:   05/18/20 0505 05/18/20 0953  BP: (!) 157/70 (!) 153/66  Pulse: 67 (!) 56  Resp: 16 15  Temp: 99.1 F (37.3 C) 99.5 F (37.5 C)  SpO2: 94% 93%   Vitals:   05/17/20 2217 05/18/20 0205 05/18/20 0505 05/18/20 0953  BP: (!) 146/69 (!) 155/73 (!) 157/70 (!) 153/66  Pulse: 66 65 67 (!) 56  Resp: 16 15 16 15   Temp: 98.7 F (37.1 C) 99.1 F (37.3 C) 99.1 F (37.3 C) 99.5 F (37.5 C)  TempSrc: Oral Oral Oral Oral  SpO2: 96% 94% 94% 93%  Weight:      Height:        General:  Pleasantly resting in bed, No acute distress. HEENT:  Normocephalic atraumatic.  Sclerae nonicteric, noninjected.  Extraocular movements intact bilaterally. Neck:  Without mass or deformity.  Trachea is midline. Lungs:  Clear to auscultate bilaterally without rhonchi, wheeze, or rales. Heart:  Regular rate and rhythm.  Without murmurs, rubs, or gallops. Abdomen:  Soft, nontender, nondistended.  Without guarding or rebound. Extremities: Without cyanosis, clubbing, edema, or obvious deformity. Vascular:  Dorsalis pedis and posterior tibial pulses palpable bilaterally. Skin:  Warm and dry, no erythema, right foot bandage clean dry intact    The results of significant diagnostics from this hospitalization (including imaging, microbiology, ancillary and laboratory) are listed below for reference.     Microbiology: Recent Results (from the past 240 hour(s))  Urine culture      Status: None   Collection Time: 05/15/20  6:14 PM   Specimen: In/Out Cath Urine  Result Value Ref Range Status   Specimen Description   Final    IN/OUT CATH URINE Performed at [facility] [address], [place], [place] [address]    Special Requests   Final    NONE Performed at [facility], [place] [address], [place], [place] [address]    Culture   Final    NO GROWTH Performed at [facility], [place] [address] [place], [place] [address]    Report Status 05/17/2020 FINAL  Final  Blood Culture (routine x 2)     Status: None (Preliminary result)   Collection Time: 05/15/20  6:30 PM   Specimen: BLOOD  Result Value Ref Range Status   Specimen Description   Final    BLOOD LEFT ANTECUBITAL Performed at [facility] [address] [place],  [place] [address]    Special Requests   Final    BOTTLES DRAWN AEROBIC AND ANAEROBIC Blood Culture results may not be optimal due to an excessive volume of blood received in culture bottles Performed at [facility] [address] [place], [place] [address]    Culture   Final    NO GROWTH 2 DAYS Performed at [facility] [address] [place], [place] [address]    Report Status PENDING  Incomplete  SARS Coronavirus 2 by RT PCR (hospital order, performed in [facility] hospital lab) Nasopharyngeal Nasopharyngeal Swab     Status: None   Collection Time: 05/15/20  6:43 PM   Specimen: Nasopharyngeal Swab  Result Value Ref Range Status   SARS Coronavirus 2 NEGATIVE NEGATIVE Final    Comment: (NOTE) SARS-CoV-2 target nucleic acids are NOT DETECTED.  The SARS-CoV-2 RNA is generally detectable in upper and lower respiratory specimens during the acute phase of infection. The lowest concentration of SARS-CoV-2 viral copies this assay can detect is 250 copies / mL. A negative result does not preclude SARS-CoV-2 infection and should not be used as the sole  basis for treatment or other patient management decisions.  A negative result may occur with improper specimen collection / handling, submission of specimen other than nasopharyngeal swab, presence of viral mutation(s) within the areas targeted by this assay, and inadequate number of viral copies (<250 copies / mL). A negative result must be combined with clinical observations, patient history, and epidemiological information.  Fact Sheet for Patients:   StrictlyIdeas.no  Fact Sheet for Healthcare Providers: BankingDealers.co.za  This test is not yet approved or  cleared by the [place] FDA and has been authorized for detection and/or diagnosis of SARS-CoV-2 by FDA under an Emergency Use Authorization (EUA).  This EUA will remain in effect (meaning this test can be used) for the duration of the COVID-19 declaration under Section 564(b)(1) of the Act, 21 U.S.C. section 360bbb-3(b)(1), unless the authorization is terminated or revoked sooner.  Performed at [facility], [place] [address] [place], [place] [address]   Surgical pcr screen     Status: None   Collection Time: 05/17/20  7:17 AM   Specimen: Nasal Mucosa; Nasal Swab  Result Value Ref Range Status   MRSA, PCR NEGATIVE NEGATIVE Final   Staphylococcus aureus NEGATIVE NEGATIVE Final    Comment: (NOTE) The Xpert SA Assay (FDA approved for NASAL specimens in patients 66 years of age and older), is one component of a comprehensive surveillance program. It is not intended to diagnose infection nor to guide or monitor treatment. Performed at [facility], [place] [address], [place], [place] [address]   Aerobic/Anaerobic Culture (surgical/deep wound)     Status: None (Preliminary result)   Collection Time: 05/17/20  8:35 AM   Specimen: Wound  Result Value Ref Range Status   Specimen Description   Final    WOUND Performed at [facility] [address] [place], [place] [address]    Special Requests   Final    NONE Performed at [facility], [place] [address], [place], [place] [address]    Gram Stain   Final    FEW WBC PRESENT, PREDOMINANTLY MONONUCLEAR NO ORGANISMS SEEN    Culture   Final    NO GROWTH < 24 HOURS Performed at [facility] [address] [place],  [address]    Report Status PENDING  Incomplete  Aerobic/Anaerobic Culture (surgical/deep wound)     Status: None (Preliminary result)   Collection Time: 05/17/20  8:43 AM   Specimen: Wound  Result Value Ref Range Status   Specimen Description   Final    WOUND Performed at [facility] [address], [place], [place] [address]    Special Requests   Final    NONE Performed at [facility], [place] [address], [place], [place] [address]    Gram Stain   Final    RARE WBC PRESENT, PREDOMINANTLY PMN NO ORGANISMS SEEN    Culture   Final    NO GROWTH < 24 HOURS Performed at [facility] [address] [place], [place] [address]    Report Status PENDING  Incomplete     Labs: BNP (last 3 results) No results for input(s): BNP in the last 8760 hours. Basic Metabolic Panel: Recent Labs  Lab 05/15/20 1614 05/16/20 0444 05/17/20 0322 05/18/20 0330  NA 133* 139 136 139  K 4.3 4.1 3.9 4.1  CL 101 107 105 105  CO2 22 23 25 24   GLUCOSE 181* 111* 144* 126*  BUN 30* 28* 21 19  CREATININE 2.56* 2.51* 2.57* 2.41*  CALCIUM 9.3 9.1 8.5* 8.5*   Liver Function Tests: Recent Labs  Lab 05/15/20 1614 05/17/20 0322 05/18/20 0330  AST 15 12* 12*  ALT 21 13 11   ALKPHOS 72 48 46  BILITOT 0.8 0.4 0.5  PROT 7.2 5.8* 5.7*  ALBUMIN 3.5 2.8* 2.8*   No results for input(s): LIPASE, AMYLASE in the last 168 hours. No results for input(s): AMMONIA in the last 168 hours. CBC: Recent Labs  Lab 05/15/20 1614 05/16/20 0444 05/17/20 0322 05/18/20 0330  WBC 10.8* 8.4  9.3 10.9*  NEUTROABS 7.6  --   --   --   HGB 11.1* 9.9* 9.4* 9.1*  HCT 31.4* 28.0* 28.1* 27.3*  MCV 88.7 89.2 91.2 91.6  PLT 311 306 302 302   Cardiac Enzymes: Recent Labs  Lab 05/16/20 1220  CKTOTAL 37*   BNP: Invalid input(s): POCBNP CBG: Recent Labs  Lab 05/17/20 0920 05/17/20 1159 05/17/20 1649 05/17/20 2218 05/18/20 0731  GLUCAP 124* 127* 175* 155* 110*   D-Dimer No results for input(s): DDIMER in the last 72 hours. Hgb A1c No results for input(s): HGBA1C in the last 72 hours. Lipid Profile No results for input(s): CHOL, HDL, LDLCALC, TRIG, CHOLHDL, LDLDIRECT in the last 72 hours. Thyroid function studies No results for input(s): TSH, T4TOTAL, T3FREE, THYROIDAB in the last 72 hours.  Invalid input(s): FREET3 Anemia work up No results for input(s): VITAMINB12, FOLATE, FERRITIN, TIBC, IRON, RETICCTPCT in the last 72 hours. Urinalysis    Component Value Date/Time   COLORURINE YELLOW 05/15/2020 1814   APPEARANCEUR CLEAR 05/15/2020 1814   APPEARANCEUR Clear 11/10/2018 1543   LABSPEC 1.005 05/15/2020 1814   PHURINE 5.0 05/15/2020 1814   GLUCOSEU 50 (A) 05/15/2020 1814   HGBUR SMALL (A) 05/15/2020 1814   BILIRUBINUR NEGATIVE 05/15/2020 1814   BILIRUBINUR Negative 11/10/2018 1543   KETONESUR NEGATIVE 05/15/2020 1814   PROTEINUR NEGATIVE 05/15/2020 1814   NITRITE NEGATIVE 05/15/2020 1814   LEUKOCYTESUR NEGATIVE 05/15/2020 1814   Sepsis Labs Invalid input(s): PROCALCITONIN,  WBC,  LACTICIDVEN Microbiology Recent Results (from the past 240 hour(s))  Urine culture     Status: None   Collection Time: 05/15/20  6:14 PM   Specimen: In/Out Cath Urine  Result Value Ref Range Status   Specimen Description  Final    IN/OUT CATH URINE Performed at [facility], [place] [address], [place], [place] [address]    Special Requests   Final    NONE Performed at [facility], [place] [address] [place], [place] [address]    Culture    Final    NO GROWTH Performed at [facility], [place] [address], [place], [place] [address]    Report Status 05/17/2020 FINAL  Final  Blood Culture (routine x 2)     Status: None (Preliminary result)   Collection Time: 05/15/20  6:30 PM   Specimen: BLOOD  Result Value Ref Range Status   Specimen Description   Final    BLOOD LEFT ANTECUBITAL Performed at [facility] [address], [place], [place] [address]    Special Requests   Final    BOTTLES DRAWN AEROBIC AND ANAEROBIC Blood Culture results may not be optimal due to an excessive volume of blood received in culture bottles Performed at [facility] [address], [place], [place] [address]    Culture   Final    NO GROWTH 2 DAYS Performed at [facility] [address] [place], [place] [address]    Report Status PENDING  Incomplete  SARS Coronavirus 2 by RT PCR (hospital order, performed in [facility] hospital lab) Nasopharyngeal Nasopharyngeal Swab     Status: None   Collection Time: 05/15/20  6:43 PM   Specimen: Nasopharyngeal Swab  Result Value Ref Range Status   SARS Coronavirus 2 NEGATIVE NEGATIVE Final    Comment: (NOTE) SARS-CoV-2 target nucleic acids are NOT DETECTED.  The SARS-CoV-2 RNA is generally detectable in upper and lower respiratory specimens during the acute phase of infection. The lowest concentration of SARS-CoV-2 viral copies this assay can detect is 250 copies / mL. A negative result does not preclude SARS-CoV-2 infection and should not be used as the sole basis for treatment or other patient management decisions.  A negative result may occur with improper specimen collection / handling, submission of specimen other than nasopharyngeal swab, presence of viral mutation(s) within the areas targeted by this assay, and inadequate number of viral copies (<250 copies / mL). A negative result must be combined with clinical observations, patient history, and  epidemiological information.  Fact Sheet for Patients:   StrictlyIdeas.no  Fact Sheet for Healthcare Providers: BankingDealers.co.za  This test is not yet approved or  cleared by the [place] FDA and has been authorized for detection and/or diagnosis of SARS-CoV-2 by FDA under an Emergency Use Authorization (EUA).  This EUA will remain in effect (meaning this test can be used) for the duration of the COVID-19 declaration under Section 564(b)(1) of the Act, 21 U.S.C. section 360bbb-3(b)(1), unless the authorization is terminated or revoked sooner.  Performed at [facility], [place] [address] [place], [place] [address]   Surgical pcr screen     Status: None   Collection Time: 05/17/20  7:17 AM   Specimen: Nasal Mucosa; Nasal Swab  Result Value Ref Range Status   MRSA, PCR NEGATIVE NEGATIVE Final   Staphylococcus aureus NEGATIVE NEGATIVE Final    Comment: (NOTE) The Xpert SA Assay (FDA approved for NASAL specimens in patients 30 years of age and older), is one component of a comprehensive surveillance program. It is not intended to diagnose infection nor to guide or monitor treatment. Performed at [facility], [place] [address], [place], [place] [address]  Aerobic/Anaerobic Culture (surgical/deep wound)     Status: None (Preliminary result)   Collection Time: 05/17/20  8:35 AM   Specimen: Wound  Result Value Ref Range Status   Specimen Description   Final    WOUND Performed at [place] [address] [place], [place] [address]    Special Requests   Final    NONE Performed at [facility], [place] [address], [place], [place] [address]    Gram Stain   Final    FEW WBC PRESENT, PREDOMINANTLY MONONUCLEAR NO ORGANISMS SEEN    Culture   Final    NO GROWTH < 24 HOURS Performed at [place] [address], [place], [place] [address]     Report Status PENDING  Incomplete  Aerobic/Anaerobic Culture (surgical/deep wound)     Status: None (Preliminary result)   Collection Time: 05/17/20  8:43 AM   Specimen: Wound  Result Value Ref Range Status   Specimen Description   Final    WOUND Performed at [place] [address], [place], [place] [address]    Special Requests   Final    NONE Performed at [facility], [place] [address] [place], [place] [address]    Gram Stain   Final    RARE WBC PRESENT, PREDOMINANTLY PMN NO ORGANISMS SEEN    Culture   Final    NO GROWTH < 24 HOURS Performed at [place] [address], [place],  [address]    Report Status PENDING  Incomplete     Time coordinating discharge: Over 30 minutes  SIGNED:   Little Ishikawa, DO [facility] 05/18/2020, 12:08 PM Pager   If 7PM-7AM, please contact night-coverage [email or web address]

## 2020-05-18 NOTE — Progress Notes (Signed)
RN reviewed discharge instructions with patient and family. All questions answered.   Paperwork given. Prescriptions electronically sent to patient pharmacy, prescriptions sent to [facility] instead of [place].     NT rolled patient down with all belongings to family car.     Benedetto Goad, RN

## 2020-05-18 NOTE — Progress Notes (Signed)
  Subjective:  Patient ID: [name], male    DOB: 1956/10/12,  MRN: [id number]  Patient bedside. Denies pain. Denies complaints. Objective:   Vitals:   05/18/20 0205 05/18/20 0505  BP: (!) 155/73 (!) 157/70  Pulse: 65 67  Resp: 15 16  Temp: 99.1 F (37.3 C) 99.1 F (37.3 C)  SpO2: 94% 94%   General AA&O x3. Normal mood and affect.  Vascular Dorsalis pedis and posterior tibial pulses  present 2+ right  Capillary refill normal to all digits. Pedal hair growth diminished.  Neurologic Epicritic sensation grossly absent.  Dermatologic (Wound) Incision well coapted with intact suture. Skin flaps warm and viable. Receding erythema.  Orthopedic: Motor intact BLE.   Assessment & Plan:  Patient was evaluated and treated and all questions answered.  Osteomyelitis right great toe -Antibiotics: Cultures pending. Believed surgical cure of OM. -WB Status: WBAT in surgical shoe. -Wound Care: Dressed with xeroform, 4x4, kerlix ACE. Leave intact until Follow up -Rx for Zofran, Percocet, and Clindamycin sent to pharmacy. Can adjust abx based upon cultures -Ok for d/c from podiatry standpoint.  Evelina Bucy, DPM  Accessible via secure chat for questions or concerns.

## 2020-05-20 ENCOUNTER — Other Ambulatory Visit: Payer: Self-pay

## 2020-05-20 ENCOUNTER — Telehealth: Payer: Self-pay | Admitting: Podiatry

## 2020-05-20 ENCOUNTER — Ambulatory Visit (INDEPENDENT_AMBULATORY_CARE_PROVIDER_SITE_OTHER): Payer: PPO

## 2020-05-20 DIAGNOSIS — E1169 Type 2 diabetes mellitus with other specified complication: Secondary | ICD-10-CM

## 2020-05-20 DIAGNOSIS — E1122 Type 2 diabetes mellitus with diabetic chronic kidney disease: Secondary | ICD-10-CM

## 2020-05-20 DIAGNOSIS — E11621 Type 2 diabetes mellitus with foot ulcer: Secondary | ICD-10-CM | POA: Diagnosis not present

## 2020-05-20 DIAGNOSIS — E785 Hyperlipidemia, unspecified: Secondary | ICD-10-CM | POA: Diagnosis not present

## 2020-05-20 DIAGNOSIS — Z794 Long term (current) use of insulin: Secondary | ICD-10-CM

## 2020-05-20 DIAGNOSIS — G4733 Obstructive sleep apnea (adult) (pediatric): Secondary | ICD-10-CM

## 2020-05-20 DIAGNOSIS — I129 Hypertensive chronic kidney disease with stage 1 through stage 4 chronic kidney disease, or unspecified chronic kidney disease: Secondary | ICD-10-CM

## 2020-05-20 DIAGNOSIS — M48061 Spinal stenosis, lumbar region without neurogenic claudication: Secondary | ICD-10-CM

## 2020-05-20 DIAGNOSIS — N182 Chronic kidney disease, stage 2 (mild): Secondary | ICD-10-CM

## 2020-05-20 DIAGNOSIS — Z9181 History of falling: Secondary | ICD-10-CM

## 2020-05-20 DIAGNOSIS — Z792 Long term (current) use of antibiotics: Secondary | ICD-10-CM

## 2020-05-20 DIAGNOSIS — N138 Other obstructive and reflux uropathy: Secondary | ICD-10-CM

## 2020-05-20 DIAGNOSIS — M47816 Spondylosis without myelopathy or radiculopathy, lumbar region: Secondary | ICD-10-CM

## 2020-05-20 DIAGNOSIS — Z452 Encounter for adjustment and management of vascular access device: Secondary | ICD-10-CM

## 2020-05-20 DIAGNOSIS — N401 Enlarged prostate with lower urinary tract symptoms: Secondary | ICD-10-CM

## 2020-05-20 DIAGNOSIS — L97519 Non-pressure chronic ulcer of other part of right foot with unspecified severity: Secondary | ICD-10-CM | POA: Diagnosis not present

## 2020-05-20 DIAGNOSIS — M86171 Other acute osteomyelitis, right ankle and foot: Secondary | ICD-10-CM

## 2020-05-20 DIAGNOSIS — L03115 Cellulitis of right lower limb: Secondary | ICD-10-CM | POA: Diagnosis not present

## 2020-05-20 DIAGNOSIS — N179 Acute kidney failure, unspecified: Secondary | ICD-10-CM | POA: Diagnosis not present

## 2020-05-20 LAB — CULTURE, BLOOD (ROUTINE X 2): Culture: NO GROWTH

## 2020-05-20 LAB — SURGICAL PATHOLOGY

## 2020-05-20 NOTE — Telephone Encounter (Signed)
[name] through [employer], "Fredia Sorrow.  Just keeping connected on Mr. Chisenhall. Saw your note. Poor man. We just got him home this week and he is already back. I will see him at [facility] and follow to support his IV ABX at home at [place]. Will keep in touch as needed. Thanks and Happy 4th!   Pam"

## 2020-05-20 NOTE — Telephone Encounter (Signed)
Leave dressing intact just until first post-op visit and then will provide orders thereafter for dressing changes

## 2020-05-20 NOTE — Telephone Encounter (Signed)
Pt's son, Herbie [place] called states pt was to have labs but was not sure who. I asked if it was Dr. March Rummage or Dr. Sherryle Lis but they didn't know. I told Herbie Baltimore I would inform Dr. March Rummage who did pt's surgery and if pt needed blood work his assistant or he would call to inform pt to go to the lab. Herbie Baltimore requested our office call him [phone].

## 2020-05-20 NOTE — Telephone Encounter (Signed)
I informed [name] of Dr. Eleanora Neighbor 05/20/2020 4:20pm.

## 2020-05-20 NOTE — Telephone Encounter (Addendum)
I spoke with pt's wife, Mardene Celeste and she states she was told by pt that he needed blood work but does not know what for or what labs. I told Mardene Celeste that I would need to have the orders from one of our doctors, but would not know until pt was seen. I checked to see if message for the lab orders had come from our office and I did not see them, I did tell Mardene Celeste I had results from his hospitalization that I could send to PCP if needed. Mardene Celeste states she will take pt to their doctor tomorrow and find out what is needed.

## 2020-05-20 NOTE — Telephone Encounter (Signed)
[name] from [facility] called stating that the pt was just released from hospital over the weekend and states there are no orders from the Dr. Rosanna Randy to know if they need to see the pt for wound although pt had an amputation. Please assist

## 2020-05-20 NOTE — Telephone Encounter (Signed)
Dr. March Rummage states pt does not need additional labs from our office, but his PCP will need labs to follow his kidney function. I informed pt's son, Herbie [place].

## 2020-05-20 NOTE — Telephone Encounter (Signed)
Pt wife called stating that the prt needed blood work from his primary care Dr to get blood work due to some medication giving his kidneys trouble pt is requesting a letter from our office to be sent to pt primary care

## 2020-05-21 DIAGNOSIS — I129 Hypertensive chronic kidney disease with stage 1 through stage 4 chronic kidney disease, or unspecified chronic kidney disease: Secondary | ICD-10-CM | POA: Diagnosis not present

## 2020-05-21 DIAGNOSIS — D631 Anemia in chronic kidney disease: Secondary | ICD-10-CM | POA: Diagnosis not present

## 2020-05-21 DIAGNOSIS — N170 Acute kidney failure with tubular necrosis: Secondary | ICD-10-CM | POA: Diagnosis not present

## 2020-05-21 DIAGNOSIS — N189 Chronic kidney disease, unspecified: Secondary | ICD-10-CM | POA: Diagnosis not present

## 2020-05-21 DIAGNOSIS — E1122 Type 2 diabetes mellitus with diabetic chronic kidney disease: Secondary | ICD-10-CM | POA: Diagnosis not present

## 2020-05-22 ENCOUNTER — Ambulatory Visit (INDEPENDENT_AMBULATORY_CARE_PROVIDER_SITE_OTHER): Payer: PPO | Admitting: Podiatry

## 2020-05-22 ENCOUNTER — Encounter (HOSPITAL_COMMUNITY): Payer: Self-pay | Admitting: Podiatry

## 2020-05-22 ENCOUNTER — Other Ambulatory Visit: Payer: Self-pay

## 2020-05-22 DIAGNOSIS — M869 Osteomyelitis, unspecified: Secondary | ICD-10-CM

## 2020-05-22 DIAGNOSIS — Z9889 Other specified postprocedural states: Secondary | ICD-10-CM

## 2020-05-22 LAB — AEROBIC/ANAEROBIC CULTURE W GRAM STAIN (SURGICAL/DEEP WOUND): Culture: NO GROWTH

## 2020-05-22 NOTE — Progress Notes (Signed)
  Subjective:  Patient ID: [name], male    DOB: 08/12/1956,  MRN: [id number]  Chief Complaint  Patient presents with  . Routine Post Op     DOS 05/17/2020  RT GREAT TOE AMP     DOS: 05/17/20 Procedure: Right great toe amputation  64 y.o. male presents with the above complaint. History confirmed with patient. Doing well feeling much better. States his kidney doctor changed his medication. Still taking his antibiotic  Objective:  Physical Exam: no tenderness at the surgical site, no edema noted and calf supple, nontender. Incision: healing well, no significant drainage, no dehiscence, no erythema. Skin flaps warm and viable  Assessment:   1. Osteomyelitis of great toe ([place])   2. Post-operative state     Plan:  Patient was evaluated and treated and all questions answered.  Post-operative State -Dressing applied consisting of xeroform, sterile gauze, kerlix and ACE bandage -WBAT in Surgical shoe -Plan for possible suture removal next week, staple removal in 2 weeks. -Complete abx.  No follow-ups on file.

## 2020-05-23 ENCOUNTER — Telehealth: Payer: Self-pay | Admitting: *Deleted

## 2020-05-23 ENCOUNTER — Telehealth: Payer: Self-pay | Admitting: Podiatry

## 2020-05-23 NOTE — Telephone Encounter (Signed)
Xeroform, 4x4, kerlix, ACE bandage. Change twice weekly

## 2020-05-23 NOTE — Telephone Encounter (Signed)
[name] from [facility] called and wanted to see if there are any dressing change orders for pt can leave message  To betty vm [phone]

## 2020-05-23 NOTE — Telephone Encounter (Signed)
[name] from [facility] called saying pt's FBS was 114 this morning and he is currently taking [place] 28 units daily. Pt is wanting to know if it should be adjusted since his FBS are consistently lower than 140.

## 2020-05-23 NOTE — Telephone Encounter (Signed)
If BGs are in normal range >70 and <150, ok to stick with current dose of [place].

## 2020-05-26 NOTE — Telephone Encounter (Signed)
Left message with Dr. Eleanora Neighbor 05/23/2020 6:39pm orders.

## 2020-05-26 NOTE — Telephone Encounter (Signed)
Patient aware and verbalizes understanding. 

## 2020-05-28 ENCOUNTER — Other Ambulatory Visit: Payer: Self-pay

## 2020-05-28 ENCOUNTER — Other Ambulatory Visit: Payer: PPO

## 2020-05-28 DIAGNOSIS — E1122 Type 2 diabetes mellitus with diabetic chronic kidney disease: Secondary | ICD-10-CM | POA: Diagnosis not present

## 2020-05-28 DIAGNOSIS — I129 Hypertensive chronic kidney disease with stage 1 through stage 4 chronic kidney disease, or unspecified chronic kidney disease: Secondary | ICD-10-CM | POA: Diagnosis not present

## 2020-05-28 DIAGNOSIS — D631 Anemia in chronic kidney disease: Secondary | ICD-10-CM | POA: Diagnosis not present

## 2020-05-28 DIAGNOSIS — N170 Acute kidney failure with tubular necrosis: Secondary | ICD-10-CM | POA: Diagnosis not present

## 2020-05-28 DIAGNOSIS — N189 Chronic kidney disease, unspecified: Secondary | ICD-10-CM | POA: Diagnosis not present

## 2020-05-29 ENCOUNTER — Ambulatory Visit: Payer: PPO | Admitting: Infectious Diseases

## 2020-05-29 ENCOUNTER — Ambulatory Visit (INDEPENDENT_AMBULATORY_CARE_PROVIDER_SITE_OTHER): Payer: PPO | Admitting: Podiatry

## 2020-05-29 ENCOUNTER — Encounter: Payer: Self-pay | Admitting: Podiatry

## 2020-05-29 DIAGNOSIS — M869 Osteomyelitis, unspecified: Secondary | ICD-10-CM

## 2020-05-29 DIAGNOSIS — Z9889 Other specified postprocedural states: Secondary | ICD-10-CM

## 2020-05-29 DIAGNOSIS — E1149 Type 2 diabetes mellitus with other diabetic neurological complication: Secondary | ICD-10-CM

## 2020-06-03 DIAGNOSIS — E11621 Type 2 diabetes mellitus with foot ulcer: Secondary | ICD-10-CM | POA: Diagnosis not present

## 2020-06-04 NOTE — Progress Notes (Signed)
Subjective: [name] is a 64 y.o. is seen today in office s/p right hallux partial amputation preformed on 05/17/2020 with Dr. March Rummage.  States he is doing well not had any significant pain.  Overall he feels well he just finished a course of antibiotics.  Denies any systemic complaints such as fevers, chills, nausea, vomiting. No calf pain, chest pain, shortness of breath.   Objective: General: No acute distress, AAOx3  DP/PT pulses palpable 2/4, CRT < 3 sec to all digits.  RIGHT foot: Incision is well coapted without any evidence of dehiscence with sutures/staples intact. There is no surrounding erythema, ascending cellulitis, fluctuance, crepitus, malodor, drainage/purulence. There is mild edema around the surgical site. There is no pain along the surgical site.  There is no erythema or signs of infection. No other areas of tenderness to bilateral lower extremities.  No other open lesions or pre-ulcerative lesions.  No pain with calf compression, swelling, warmth, erythema.   Assessment and Plan:  Status post right hallux partial amputation, doing well with no complications   -Treatment options discussed including all alternatives, risks, and complications -I reviewed the sutures today without the staples intact.  Small amount of antibiotic ointment was applied followed by dry sterile dressing. -Surgical shoe. -Elevation -Pain medication as needed. -Monitor for any clinical signs or symptoms of infection and DVT/PE and directed to call the office immediately should any occur or go to the ER. -Follow-up as scheduled or sooner if any problems arise. In the meantime, encouraged to call the office with any questions, concerns, change in symptoms.   Celesta Gentile, DPM

## 2020-06-05 ENCOUNTER — Other Ambulatory Visit: Payer: Self-pay

## 2020-06-05 ENCOUNTER — Ambulatory Visit (INDEPENDENT_AMBULATORY_CARE_PROVIDER_SITE_OTHER): Payer: PPO | Admitting: Podiatry

## 2020-06-05 DIAGNOSIS — E1149 Type 2 diabetes mellitus with other diabetic neurological complication: Secondary | ICD-10-CM

## 2020-06-05 DIAGNOSIS — E211 Secondary hyperparathyroidism, not elsewhere classified: Secondary | ICD-10-CM | POA: Diagnosis not present

## 2020-06-05 DIAGNOSIS — E1129 Type 2 diabetes mellitus with other diabetic kidney complication: Secondary | ICD-10-CM | POA: Diagnosis not present

## 2020-06-05 DIAGNOSIS — N189 Chronic kidney disease, unspecified: Secondary | ICD-10-CM | POA: Diagnosis not present

## 2020-06-05 DIAGNOSIS — M869 Osteomyelitis, unspecified: Secondary | ICD-10-CM

## 2020-06-05 DIAGNOSIS — N170 Acute kidney failure with tubular necrosis: Secondary | ICD-10-CM | POA: Diagnosis not present

## 2020-06-05 DIAGNOSIS — D508 Other iron deficiency anemias: Secondary | ICD-10-CM | POA: Diagnosis not present

## 2020-06-05 DIAGNOSIS — I129 Hypertensive chronic kidney disease with stage 1 through stage 4 chronic kidney disease, or unspecified chronic kidney disease: Secondary | ICD-10-CM | POA: Diagnosis not present

## 2020-06-05 DIAGNOSIS — R809 Proteinuria, unspecified: Secondary | ICD-10-CM | POA: Diagnosis not present

## 2020-06-05 DIAGNOSIS — E1122 Type 2 diabetes mellitus with diabetic chronic kidney disease: Secondary | ICD-10-CM | POA: Diagnosis not present

## 2020-06-09 NOTE — Progress Notes (Signed)
Subjective: [name] is a 64 y.o. is seen today in office s/p right hallux partial amputation preformed on 05/17/2020 with Dr. March Rummage.  He presents today for suture removal.  Overall he states he is doing much better.  Denies any pain he denies any swelling or redness.  Denies any drainage.  He has no systemic concerns including fevers, chills, nausea, vomiting.  No calf pain, chest pain, shortness of breath.  Objective: General: No acute distress, AAOx3  DP/PT pulses palpable 2/4, CRT < 3 sec to all digits.  RIGHT foot: Incision is well coapted without any evidence of dehiscence with sutures intact.  There is no surrounding erythema, ascending cellulitis but there is no fluctuation or crepitation there is no drainage or pus or any signs of infection noted today.   No other open lesions or pre-ulcerative lesions.  No pain with calf compression, swelling, warmth, erythema.   Assessment and Plan:  Status post right hallux partial amputation, doing well with no complications   -Treatment options discussed including all alternatives, risks, and complications -Remove the sutures today.  Incision was healing well with any signs of dehiscence.  I would continue a small amount of antibiotic ointment daily.  Continue offloading shoe for now.  Encourage elevation.  I will see him back in the next couple weeks at that point we will likely start to transition back into regular shoe as tolerated.  Return in about 10 days (around 06/15/2020).  Trula Slade DPM

## 2020-06-13 ENCOUNTER — Encounter: Payer: PPO | Admitting: Podiatry

## 2020-06-18 ENCOUNTER — Encounter: Payer: Self-pay | Admitting: Family Medicine

## 2020-06-18 ENCOUNTER — Ambulatory Visit (INDEPENDENT_AMBULATORY_CARE_PROVIDER_SITE_OTHER): Payer: PPO | Admitting: Family Medicine

## 2020-06-18 ENCOUNTER — Other Ambulatory Visit: Payer: Self-pay

## 2020-06-18 VITALS — BP 146/74 | HR 61 | Temp 98.0°F | Ht 72.0 in | Wt 203.4 lb

## 2020-06-18 DIAGNOSIS — N522 Drug-induced erectile dysfunction: Secondary | ICD-10-CM | POA: Diagnosis not present

## 2020-06-18 DIAGNOSIS — Z89419 Acquired absence of unspecified great toe: Secondary | ICD-10-CM | POA: Insufficient documentation

## 2020-06-18 DIAGNOSIS — Z8631 Personal history of diabetic foot ulcer: Secondary | ICD-10-CM | POA: Diagnosis not present

## 2020-06-18 DIAGNOSIS — N179 Acute kidney failure, unspecified: Secondary | ICD-10-CM | POA: Diagnosis not present

## 2020-06-18 MED ORDER — SILDENAFIL CITRATE 25 MG PO TABS: 25.0000 mg | ORAL_TABLET | Freq: Every day | ORAL | 3 refills | Status: DC | PRN

## 2020-06-18 NOTE — Patient Instructions (Signed)
Sildenafil tablets (Erectile Dysfunction) What is this medicine? SILDENAFIL (sil DEN a fil) is used to treat erection problems in men. This medicine may be used for other purposes; ask your health care provider or pharmacist if you have questions. COMMON BRAND NAME(S): Viagra What should I tell my health care provider before I take this medicine? They need to know if you have any of these conditions:  bleeding disorders  eye or vision problems, including a rare inherited eye disease called retinitis pigmentosa  anatomical deformation of the penis, Peyronie's disease, or history of priapism (painful and prolonged erection)  heart disease, angina, a history of heart attack, irregular heart beats, or other heart problems  high or low blood pressure  history of blood diseases, like sickle cell anemia or leukemia  history of stomach bleeding  kidney disease  liver disease  stroke  an unusual or allergic reaction to sildenafil, other medicines, foods, dyes, or preservatives  pregnant or trying to get pregnant  breast-feeding How should I use this medicine? Take this medicine by mouth with a glass of water. Follow the directions on the prescription label. The dose is usually taken 1 hour before sexual activity. You should not take the dose more than once per day. Do not take your medicine more often than directed. Talk to your pediatrician regarding the use of this medicine in children. This medicine is not used in children for this condition. Overdosage: If you think you have taken too much of this medicine contact a poison control center or emergency room at once. NOTE: This medicine is only for you. Do not share this medicine with others. What if I miss a dose? This does not apply. Do not take double or extra doses. What may interact with this medicine? Do not take this medicine with any of the following medications:  cisapride  nitrates like amyl nitrite, isosorbide  dinitrate, isosorbide mononitrate, nitroglycerin  riociguat This medicine may also interact with the following medications:  antiviral medicines for HIV or AIDS  bosentan  certain medicines for benign prostatic hyperplasia (BPH)  certain medicines for blood pressure  certain medicines for fungal infections like ketoconazole and itraconazole  cimetidine  erythromycin  rifampin This list may not describe all possible interactions. Give your health care provider a list of all the medicines, herbs, non-prescription drugs, or dietary supplements you use. Also tell them if you smoke, drink alcohol, or use illegal drugs. Some items may interact with your medicine. What should I watch for while using this medicine? If you notice any changes in your vision while taking this drug, call your doctor or health care professional as soon as possible. Stop using this medicine and call your health care provider right away if you have a loss of sight in one or both eyes. Contact your doctor or health care professional right away if you have an erection that lasts longer than 4 hours or if it becomes painful. This may be a sign of a serious problem and must be treated right away to prevent permanent damage. If you experience symptoms of nausea, dizziness, chest pain or arm pain upon initiation of sexual activity after taking this medicine, you should refrain from further activity and call your doctor or health care professional as soon as possible. Do not drink alcohol to excess (examples, 5 glasses of wine or 5 shots of whiskey) when taking this medicine. When taken in excess, alcohol can increase your chances of getting a headache or getting dizzy, increasing  your heart rate or lowering your blood pressure. Using this medicine does not protect you or your partner against HIV infection (the virus that causes AIDS) or other sexually transmitted diseases. What side effects may I notice from receiving this  medicine? Side effects that you should report to your doctor or health care professional as soon as possible:  allergic reactions like skin rash, itching or hives, swelling of the face, lips, or tongue  breathing problems  changes in hearing  changes in vision  chest pain  fast, irregular heartbeat  prolonged or painful erection  seizures Side effects that usually do not require medical attention (report to your doctor or health care professional if they continue or are bothersome):  back pain  dizziness  flushing  headache  indigestion  muscle aches  nausea  stuffy or runny nose This list may not describe all possible side effects. Call your doctor for medical advice about side effects. You may report side effects to FDA at 1-800-FDA-1088. Where should I keep my medicine? Keep out of reach of children. Store at room temperature between 15 and 30 degrees C (59 and 86 degrees F). Throw away any unused medicine after the expiration date. NOTE: This sheet is a summary. It may not cover all possible information. If you have questions about this medicine, talk to your doctor, pharmacist, or health care provider.  2020 Elsevier/Gold Standard (2015-10-15 12:00:25)  

## 2020-06-18 NOTE — Progress Notes (Signed)
Subjective: CC: Follow-up osteomyelitis PCP: Clifford Norlander, DO [id number] [name] is a 64 y.o. male presenting to clinic today for:  1.  Osteomyelitis of the right great toe Patient status post partial amputation of the right great toe for an osteomyelitis.  He is about a month out from his surgery and seems to be doing well.  He is currently walking in a postop shoe, which he hopes he gets released from tomorrow.  He denies any drainage, pain, discoloration of the toe.  No falls.  No fevers.  Good urine output.  He is continue to be under the care of a kidney specialist due to renal injury after treatment with IV vancomycin.  He should be seeing Dr. Theador [name] in the next several weeks and will have repeat labs prior to that appointment.  He asked that we hold off on a repeat labs today.  2.  Erectile dysfunction Patient does want I discussed erectile dysfunction.  He has trouble achieving and maintaining an erection and would like to try something.  Has never been on any erectile dysfunction medications.  He is not currently on any nitrates.  He has no known history of MI.   ROS: Per HPI  No Known Allergies Past Medical History:  Diagnosis Date  . Chronic kidney disease   . Complication of anesthesia    Hard to wake  . Degenerative joint disease (DJD) of lumbar spine   . Diabetes mellitus without complication ([place])    Type II  . Essential hypertension 02/23/2017  . Hypertension   . Lumbar stenosis   . Proteinuria   . Radicular leg pain     Current Outpatient Medications:  .  amLODipine (NORVASC) 10 MG tablet, Take 1 tablet (10 mg total) by mouth daily., Disp: 90 tablet, Rfl: 3 .  cholecalciferol (VITAMIN D3) 25 MCG (1000 UNIT) tablet, Take 2,000 Units by mouth daily., Disp: , Rfl:  .  cloNIDine (CATAPRES) 0.3 MG tablet, Take 1 tablet (0.3 mg total) by mouth 2 (two) times daily., Disp: 180 tablet, Rfl: 3 .  ferrous sulfate (FEROSUL) 325 (65 FE) MG tablet, Take 325 mg by  mouth daily with breakfast., Disp: , Rfl:  .  gabapentin (NEURONTIN) 100 MG capsule, Take 1 capsule (100 mg total) by mouth 3 (three) times daily. (Patient taking differently: Take 100 mg by mouth in the morning and at bedtime. ), Disp: 90 capsule, Rfl: 3 .  insulin degludec (TRESIBA FLEXTOUCH) 100 UNIT/ML FlexTouch Pen, Inject 0.28 mLs (28 Units total) into the skin every morning. Increase by one unit daily until fasting sugar is 150 (Patient taking differently: Inject 28 Units into the skin every morning. ), Disp: , Rfl:  .  tamsulosin (FLOMAX) 0.4 MG CAPS capsule, Take 0.4 mg by mouth daily after supper. , Disp: , Rfl:  Social History   Socioeconomic History  . Marital status: Married    Spouse name: Not on file  . Number of children: Not on file  . Years of education: Not on file  . Highest education level: Not on file  Occupational History  . Not on file  Tobacco Use  . Smoking status: Never Smoker  . Smokeless tobacco: Never Used  Vaping Use  . Vaping Use: Never used  Substance and Sexual Activity  . Alcohol use: Yes    Alcohol/week: 6.0 standard drinks    Types: 6 Cans of beer per week  . Drug use: No  . Sexual activity: Yes  Other Topics  Concern  . Not on file  Social History Narrative  . Not on file   Social Determinants of Health   Financial Resource Strain:   . Difficulty of Paying Living Expenses:   Food Insecurity:   . Worried About Charity fundraiser in the Last Year:   . Arboriculturist in the Last Year:   Transportation Needs:   . Film/video editor (Medical):   Marland Kitchen Lack of Transportation (Non-Medical):   Physical Activity:   . Days of Exercise per Week:   . Minutes of Exercise per Session:   Stress:   . Feeling of Stress :   Social Connections:   . Frequency of Communication with Friends and Family:   . Frequency of Social Gatherings with Friends and Family:   . Attends Religious Services:   . Active Member of Clubs or Organizations:   . Attends  Archivist Meetings:   Marland Kitchen Marital Status:   Intimate Partner Violence:   . Fear of Current or Ex-Partner:   . Emotionally Abused:   Marland Kitchen Physically Abused:   . Sexually Abused:    Family History  Problem Relation Age of Onset  . Heart attack Father   . Hypertension Father   . Vision loss Sister        one eye  . Cancer Brother        skin  . Diabetes Brother     Objective: Office vital signs reviewed. BP (!) 146/74   Pulse 61   Temp 98 F (36.7 C)   Ht 6' (1.829 m)   Wt 203 lb 6.4 oz (92.3 kg)   SpO2 100%   BMI 27.59 kg/m   Physical Examination:  General: Awake, alert, well nourished, No acute distress HEENT: Normal, moist mucous membranes  Cardio: regular rate and rhythm, S1S2 heard, no murmurs appreciated Pulm: clear to auscultation bilaterally, no wheezes, rhonchi or rales; normal work of breathing on room air MSK: Normal gait and station Skin: dry; intact; he has good healing of the right great toe.  No evidence of secondary infection or complication  Assessment/ Plan: 64 y.o. male   1. History of amputation of great toe ([place]) Doing well status post partial amputation of the right great toe.  He seems to be healing extremely well.  He has follow-up with Dr. Jacqualyn [name] tomorrow for recheck.  Hopefully help him to come out of the walking shoe.  We discussed consideration for diabetic shoes.  He thinks that there is a diabetic issue office where he is going tomorrow and he wants to discuss this further with Dr. Jacqualyn [name] first.  If for any reason they are unable to arrange diabetic shoe prescription for him, I am glad to write this for him and we can send locally to one of our pharmacies.  2. Personal history of diabetic foot ulcer We discussed diligence about foot care and need for diabetic shoes  3. Drug-induced erectile dysfunction Trial of Viagra.  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  We  discussed red flag signs and symptoms warranting further evaluation emergency department.  He will follow-up as needed - sildenafil (VIAGRA) 25 MG tablet; Take 1-2 tablets (25-50 mg total) by mouth daily as needed for erectile dysfunction.  Dispense: 10 tablet; Refill: 3  4. AKI (acute kidney injury) ([place]) He is under the care of a renal specialist at this point.  I reviewed his last renal  function panel which showed persistent elevation in creatinine but it does seem to be downtrending slightly.  I agree with avoiding nephrotoxic agents and we discussed the reasons why his medications were discontinued today.  Overall, he seems to be in fairly good health considering recent events.  I would like to see him back again in 3 months for recheck of his feet and sugar.   No orders of the defined types were placed in this encounter.  No orders of the defined types were placed in this encounter.    Clifford Norlander, DO [place] [phone]

## 2020-06-19 ENCOUNTER — Ambulatory Visit (INDEPENDENT_AMBULATORY_CARE_PROVIDER_SITE_OTHER): Payer: PPO | Admitting: Podiatry

## 2020-06-19 DIAGNOSIS — M2042 Other hammer toe(s) (acquired), left foot: Secondary | ICD-10-CM

## 2020-06-19 DIAGNOSIS — M2041 Other hammer toe(s) (acquired), right foot: Secondary | ICD-10-CM

## 2020-06-19 DIAGNOSIS — E1149 Type 2 diabetes mellitus with other diabetic neurological complication: Secondary | ICD-10-CM

## 2020-06-19 DIAGNOSIS — Z89419 Acquired absence of unspecified great toe: Secondary | ICD-10-CM

## 2020-06-22 NOTE — Progress Notes (Signed)
Subjective: [name] is a 64 y.o. is seen today in office s/p right hallux partial amputation preformed on 05/17/2020 with Dr. March Rummage.  States he has been doing well and is still in the surgical shoe.  He has not seen any open to the incision denies drainage or pus or any swelling or redness.  He has no pain.  Is no new concerns today.  His last blood sugar was 180. He has no systemic concerns including fevers, chills, nausea, vomiting.  No calf pain, chest pain, shortness of breath.  Objective: General: No acute distress, AAOx3  DP/PT pulses palpable 2/4, CRT < 3 sec to all digits.  RIGHT foot: Incision is well coapted without any evidence of dehiscence and the scar is formed on the incision.  Trace edema.  There is no erythema or warmth there is no ascending cellulitis.  No signs of infection or dehiscence identified today.  Hammertoes present on the other digits. No other open lesions or pre-ulcerative lesions.  No pain with calf compression, swelling, warmth, erythema.   Assessment and Plan:  Status post right hallux partial amputation, doing well with no complications   -Treatment options discussed including all alternatives, risks, and complications -At this time he will transition back to regular shoe as tolerated we discussed watching his feet daily for any new area of skin irritation or breakdown.  Also recommended diabetic shoes.  He will see Liliane Channel our pedorthotist for measurement of diabetic shoes.  Return in about 4 weeks (around 07/17/2020).  Trula Slade DPM

## 2020-07-01 ENCOUNTER — Ambulatory Visit: Payer: PPO | Admitting: Orthotics

## 2020-07-01 ENCOUNTER — Other Ambulatory Visit: Payer: Self-pay

## 2020-07-01 DIAGNOSIS — E1149 Type 2 diabetes mellitus with other diabetic neurological complication: Secondary | ICD-10-CM

## 2020-07-01 DIAGNOSIS — M2041 Other hammer toe(s) (acquired), right foot: Secondary | ICD-10-CM

## 2020-07-01 DIAGNOSIS — Z89419 Acquired absence of unspecified great toe: Secondary | ICD-10-CM

## 2020-07-01 DIAGNOSIS — M2042 Other hammer toe(s) (acquired), left foot: Secondary | ICD-10-CM

## 2020-07-01 NOTE — Progress Notes (Signed)

## 2020-07-04 ENCOUNTER — Other Ambulatory Visit: Payer: PPO

## 2020-07-04 ENCOUNTER — Encounter: Payer: Self-pay | Admitting: Family Medicine

## 2020-07-04 ENCOUNTER — Ambulatory Visit (INDEPENDENT_AMBULATORY_CARE_PROVIDER_SITE_OTHER): Payer: PPO | Admitting: Family Medicine

## 2020-07-04 ENCOUNTER — Other Ambulatory Visit: Payer: Self-pay

## 2020-07-04 VITALS — BP 158/76 | HR 90 | Temp 98.0°F | Ht 72.0 in | Wt 202.0 lb

## 2020-07-04 DIAGNOSIS — D508 Other iron deficiency anemias: Secondary | ICD-10-CM | POA: Diagnosis not present

## 2020-07-04 DIAGNOSIS — N170 Acute kidney failure with tubular necrosis: Secondary | ICD-10-CM | POA: Diagnosis not present

## 2020-07-04 DIAGNOSIS — E1129 Type 2 diabetes mellitus with other diabetic kidney complication: Secondary | ICD-10-CM | POA: Diagnosis not present

## 2020-07-04 DIAGNOSIS — E211 Secondary hyperparathyroidism, not elsewhere classified: Secondary | ICD-10-CM | POA: Diagnosis not present

## 2020-07-04 DIAGNOSIS — N189 Chronic kidney disease, unspecified: Secondary | ICD-10-CM | POA: Diagnosis not present

## 2020-07-04 DIAGNOSIS — C44212 Basal cell carcinoma of skin of right ear and external auricular canal: Secondary | ICD-10-CM

## 2020-07-04 DIAGNOSIS — E1122 Type 2 diabetes mellitus with diabetic chronic kidney disease: Secondary | ICD-10-CM | POA: Diagnosis not present

## 2020-07-04 DIAGNOSIS — I129 Hypertensive chronic kidney disease with stage 1 through stage 4 chronic kidney disease, or unspecified chronic kidney disease: Secondary | ICD-10-CM | POA: Diagnosis not present

## 2020-07-04 DIAGNOSIS — R809 Proteinuria, unspecified: Secondary | ICD-10-CM | POA: Diagnosis not present

## 2020-07-04 NOTE — Progress Notes (Signed)
BP (!) 158/76   Pulse 90   Temp 98 F (36.7 C)   Ht 6' (1.829 m)   Wt 202 lb (91.6 kg)   BMI 27.40 kg/m    Subjective:   Patient ID: [name], male    DOB: [birth year], 64 y.o.   MRN: [id number]  HPI: [name] is a 64 y.o. male presenting on 07/04/2020 for sore on ear   HPI Patient is coming in with a sore on his ear that is been there for 3 weeks.  Patient denies anything that before but is really palpable.  Relevant past medical, surgical, family and social history reviewed and updated as indicated. Interim medical history since our last visit reviewed. Allergies and medications reviewed and updated.  Review of Systems  Constitutional: Negative for chills and fever.  Musculoskeletal: Negative for back pain and gait problem.  Skin: Positive for color change and wound. Negative for rash.  All other systems reviewed and are negative.   Per HPI unless specifically indicated above   Allergies as of 07/04/2020   No Known Allergies     Medication List       Accurate as of July 04, 2020 12:11 PM. If you have any questions, ask your nurse or doctor.        amLODipine 10 MG tablet Commonly known as: NORVASC Take 1 tablet (10 mg total) by mouth daily.   cholecalciferol 25 MCG (1000 UNIT) tablet Commonly known as: VITAMIN D3 Take 2,000 Units by mouth daily.   cloNIDine 0.3 MG tablet Commonly known as: CATAPRES Take 1 tablet (0.3 mg total) by mouth 2 (two) times daily.   FeroSul 325 (65 FE) MG tablet Generic drug: ferrous sulfate Take 325 mg by mouth daily with breakfast.   gabapentin 100 MG capsule Commonly known as: NEURONTIN Take 1 capsule (100 mg total) by mouth 3 (three) times daily. What changed: when to take this   sildenafil 25 MG tablet Commonly known as: Viagra Take 1-2 tablets (25-50 mg total) by mouth daily as needed for erectile dysfunction.   tamsulosin 0.4 MG Caps capsule Commonly known as: FLOMAX Take 0.4 mg by mouth  daily after supper.   [name] FlexTouch 100 UNIT/ML FlexTouch Pen Generic drug: insulin degludec Inject 0.28 mLs (28 Units total) into the skin every morning. Increase by one unit daily until fasting sugar is 150 What changed: additional instructions        Objective:   BP (!) 158/76   Pulse 90   Temp 98 F (36.7 C)   Ht 6' (1.829 m)   Wt 202 lb (91.6 kg)   BMI 27.40 kg/m   Wt Readings from Last 3 Encounters:  07/04/20 202 lb (91.6 kg)  06/18/20 203 lb 6.4 oz (92.3 kg)  05/15/20 213 lb (96.6 kg)    Physical Exam Skin:    General: Skin is warm.     Findings: Lesion (0.5 cm lesion with central ulceration that is raised and rolled edges, skin color tone on right outer auricle) present.       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  Counseling provided for all of the  vaccine components Orders Placed This Encounter  Procedures  . Ambulatory referral to Dermatology    Caryl Pina, MD [facility] 07/04/2020, 12:11 PM

## 2020-07-10 DIAGNOSIS — Z85828 Personal history of other malignant neoplasm of skin: Secondary | ICD-10-CM | POA: Diagnosis not present

## 2020-07-10 DIAGNOSIS — L570 Actinic keratosis: Secondary | ICD-10-CM | POA: Diagnosis not present

## 2020-07-10 DIAGNOSIS — L920 Granuloma annulare: Secondary | ICD-10-CM | POA: Diagnosis not present

## 2020-07-10 DIAGNOSIS — C44222 Squamous cell carcinoma of skin of right ear and external auricular canal: Secondary | ICD-10-CM | POA: Diagnosis not present

## 2020-07-11 DIAGNOSIS — E211 Secondary hyperparathyroidism, not elsewhere classified: Secondary | ICD-10-CM | POA: Diagnosis not present

## 2020-07-11 DIAGNOSIS — N170 Acute kidney failure with tubular necrosis: Secondary | ICD-10-CM | POA: Diagnosis not present

## 2020-07-11 DIAGNOSIS — E1122 Type 2 diabetes mellitus with diabetic chronic kidney disease: Secondary | ICD-10-CM | POA: Diagnosis not present

## 2020-07-11 DIAGNOSIS — I129 Hypertensive chronic kidney disease with stage 1 through stage 4 chronic kidney disease, or unspecified chronic kidney disease: Secondary | ICD-10-CM | POA: Diagnosis not present

## 2020-07-11 DIAGNOSIS — E1129 Type 2 diabetes mellitus with other diabetic kidney complication: Secondary | ICD-10-CM | POA: Diagnosis not present

## 2020-07-11 DIAGNOSIS — N189 Chronic kidney disease, unspecified: Secondary | ICD-10-CM | POA: Diagnosis not present

## 2020-07-11 DIAGNOSIS — R809 Proteinuria, unspecified: Secondary | ICD-10-CM | POA: Diagnosis not present

## 2020-07-11 DIAGNOSIS — D631 Anemia in chronic kidney disease: Secondary | ICD-10-CM | POA: Diagnosis not present

## 2020-07-17 ENCOUNTER — Other Ambulatory Visit: Payer: Self-pay

## 2020-07-17 ENCOUNTER — Ambulatory Visit (INDEPENDENT_AMBULATORY_CARE_PROVIDER_SITE_OTHER): Payer: PPO | Admitting: Podiatry

## 2020-07-17 DIAGNOSIS — Z89419 Acquired absence of unspecified great toe: Secondary | ICD-10-CM

## 2020-07-17 DIAGNOSIS — E1149 Type 2 diabetes mellitus with other diabetic neurological complication: Secondary | ICD-10-CM

## 2020-07-23 NOTE — Progress Notes (Signed)
Subjective: [name] is a 64 y.o. is seen today in office s/p right hallux partial amputation preformed on 05/17/2020 with Dr. March Rummage. States he is doing well and not having any issues. He is wearing a regular shoe and awaiting diabetic shoe/insert. No open lesion and no pain to the surgical site.. He has no systemic concerns including fevers, chills, nausea, vomiting.  No calf pain, chest pain, shortness of breath.  Objective: General: No acute distress, AAOx3  DP/PT pulses palpable 2/4, CRT < 3 sec to all digits.  RIGHT foot: Incision is well coapted without any evidence of dehiscence and the scar is formed on the incision. No significant edema. There is no erythema or warmth. No open lesions.  No other open lesions or pre-ulcerative lesions.  No pain with calf compression, swelling, warmth, erythema.   Assessment and Plan:  Status post right hallux partial amputation, doing well with no complications   -Treatment options discussed including all alternatives, risks, and complications -Continue with regular shoe gear. Awaiting diabetic shoe/insert.  -Discussed daily foot inspection -At this time I am going to discharge him from the postop course but encouraged to call with any questions/concerns. We will see him back for routine diabetic foot exams. Awaiting new diabetic shoes/inserts.   Trula Slade DPM  Trula Slade DPM

## 2020-07-30 DIAGNOSIS — C44222 Squamous cell carcinoma of skin of right ear and external auricular canal: Secondary | ICD-10-CM | POA: Diagnosis not present

## 2020-07-30 DIAGNOSIS — Z85828 Personal history of other malignant neoplasm of skin: Secondary | ICD-10-CM | POA: Diagnosis not present

## 2020-08-12 ENCOUNTER — Ambulatory Visit: Payer: PPO | Admitting: Podiatry

## 2020-08-13 DIAGNOSIS — Z4801 Encounter for change or removal of surgical wound dressing: Secondary | ICD-10-CM | POA: Diagnosis not present

## 2020-08-27 ENCOUNTER — Other Ambulatory Visit: Payer: Self-pay

## 2020-08-27 ENCOUNTER — Ambulatory Visit (INDEPENDENT_AMBULATORY_CARE_PROVIDER_SITE_OTHER): Payer: PPO | Admitting: Family Medicine

## 2020-08-27 ENCOUNTER — Encounter: Payer: Self-pay | Admitting: Family Medicine

## 2020-08-27 VITALS — BP 139/64 | HR 69 | Temp 98.7°F | Ht 72.0 in | Wt 203.8 lb

## 2020-08-27 DIAGNOSIS — Z8631 Personal history of diabetic foot ulcer: Secondary | ICD-10-CM

## 2020-08-27 DIAGNOSIS — Z89419 Acquired absence of unspecified great toe: Secondary | ICD-10-CM | POA: Diagnosis not present

## 2020-08-27 DIAGNOSIS — R809 Proteinuria, unspecified: Secondary | ICD-10-CM | POA: Diagnosis not present

## 2020-08-27 DIAGNOSIS — N184 Chronic kidney disease, stage 4 (severe): Secondary | ICD-10-CM | POA: Diagnosis not present

## 2020-08-27 DIAGNOSIS — E1159 Type 2 diabetes mellitus with other circulatory complications: Secondary | ICD-10-CM | POA: Diagnosis not present

## 2020-08-27 DIAGNOSIS — N1831 Chronic kidney disease, stage 3a: Secondary | ICD-10-CM

## 2020-08-27 DIAGNOSIS — E1169 Type 2 diabetes mellitus with other specified complication: Secondary | ICD-10-CM

## 2020-08-27 DIAGNOSIS — I129 Hypertensive chronic kidney disease with stage 1 through stage 4 chronic kidney disease, or unspecified chronic kidney disease: Secondary | ICD-10-CM | POA: Diagnosis not present

## 2020-08-27 DIAGNOSIS — I152 Hypertension secondary to endocrine disorders: Secondary | ICD-10-CM | POA: Diagnosis not present

## 2020-08-27 DIAGNOSIS — N189 Chronic kidney disease, unspecified: Secondary | ICD-10-CM | POA: Diagnosis not present

## 2020-08-27 DIAGNOSIS — E785 Hyperlipidemia, unspecified: Secondary | ICD-10-CM

## 2020-08-27 DIAGNOSIS — N170 Acute kidney failure with tubular necrosis: Secondary | ICD-10-CM | POA: Diagnosis not present

## 2020-08-27 DIAGNOSIS — E1122 Type 2 diabetes mellitus with diabetic chronic kidney disease: Secondary | ICD-10-CM

## 2020-08-27 DIAGNOSIS — E1129 Type 2 diabetes mellitus with other diabetic kidney complication: Secondary | ICD-10-CM | POA: Diagnosis not present

## 2020-08-27 LAB — BAYER DCA HB A1C WAIVED: HB A1C (BAYER DCA - WAIVED): 7 % — ABNORMAL HIGH (ref <7.0)

## 2020-08-27 NOTE — Progress Notes (Signed)
Subjective: CC: DM2 PCP: [name], DO WEX:[id number] [name] is a 64 y.o. male presenting to clinic today for:  1. Type 2 Diabetes w/ HTN, HLD, foot lesion:  Patient is compliant with his Catapres, Norvasc, Tresiba, and Neurontin He is injecting 30 units of the [place] currently.  Blood sugars are running between 140s and 200s fasting.  He has a follow-up with Dr. [name] in 2 weeks.  He has not yet scheduled his eye exam  Last eye exam: due Last foot exam: Up-to-date Last A1c:  Lab Results  Component Value Date   HGBA1C 6.7 (H) 05/06/2020   Nephropathy screen indicated?:  Has known stage IV renal disease and is followed by renal Last flu, zoster and/or pneumovax: Declines influenza, pneumococcal, Covid and Tdap Immunization History  Administered Date(s) Administered  . Influenza,inj,Quad PF,6+ Mos 08/25/2017, 09/01/2018  . Influenza,inj,quad, With Preservative 09/15/2017, 09/11/2018  . Pneumococcal Polysaccharide-23 04/14/2017    ROS: Per HPI  No Known Allergies Past Medical History:  Diagnosis Date  . Chronic kidney disease   . Complication of anesthesia    Hard to wake  . Degenerative joint disease (DJD) of lumbar spine   . Diabetes mellitus without complication ([place])    Type II  . Essential hypertension 02/23/2017  . Hypertension   . Lumbar stenosis   . Proteinuria   . Radicular leg pain     Current Outpatient Medications:  .  amLODipine (NORVASC) 10 MG tablet, Take 1 tablet (10 mg total) by mouth daily., Disp: 90 tablet, Rfl: 3 .  cholecalciferol (VITAMIN D3) 25 MCG (1000 UNIT) tablet, Take 2,000 Units by mouth daily., Disp: , Rfl:  .  cloNIDine (CATAPRES) 0.3 MG tablet, Take 1 tablet (0.3 mg total) by mouth 2 (two) times daily., Disp: 180 tablet, Rfl: 3 .  ferrous sulfate (FEROSUL) 325 (65 FE) MG tablet, Take 325 mg by mouth daily with breakfast., Disp: , Rfl:  .  gabapentin (NEURONTIN) 100 MG capsule, Take 1 capsule (100 mg total) by mouth 3  (three) times daily. (Patient taking differently: Take 100 mg by mouth in the morning and at bedtime. ), Disp: 90 capsule, Rfl: 3 .  insulin degludec (TRESIBA FLEXTOUCH) 100 UNIT/ML FlexTouch Pen, Inject 0.28 mLs (28 Units total) into the skin every morning. Increase by one unit daily until fasting sugar is 150 (Patient taking differently: Inject 28 Units into the skin every morning. ), Disp: , Rfl:  .  sildenafil (VIAGRA) 25 MG tablet, Take 1-2 tablets (25-50 mg total) by mouth daily as needed for erectile dysfunction., Disp: 10 tablet, Rfl: 3 .  tamsulosin (FLOMAX) 0.4 MG CAPS capsule, Take 0.4 mg by mouth daily after supper. , Disp: , Rfl:  Social History   Socioeconomic History  . Marital status: Married    Spouse name: Not on file  . Number of children: Not on file  . Years of education: Not on file  . Highest education level: Not on file  Occupational History  . Not on file  Tobacco Use  . Smoking status: Never Smoker  . Smokeless tobacco: Never Used  Vaping Use  . Vaping Use: Never used  Substance and Sexual Activity  . Alcohol use: Yes    Alcohol/week: 6.0 standard drinks    Types: 6 Cans of beer per week  . Drug use: No  . Sexual activity: Yes  Other Topics Concern  . Not on file  Social History Narrative  . Not on file   Social Determinants  of Health   Financial Resource Strain:   . Difficulty of Paying Living Expenses: Not on file  Food Insecurity:   . Worried About Charity fundraiser in the Last Year: Not on file  . Ran Out of Food in the Last Year: Not on file  Transportation Needs:   . Lack of Transportation (Medical): Not on file  . Lack of Transportation (Non-Medical): Not on file  Physical Activity:   . Days of Exercise per Week: Not on file  . Minutes of Exercise per Session: Not on file  Stress:   . Feeling of Stress : Not on file  Social Connections:   . Frequency of Communication with Friends and Family: Not on file  . Frequency of Social  Gatherings with Friends and Family: Not on file  . Attends Religious Services: Not on file  . Active Member of Clubs or Organizations: Not on file  . Attends Archivist Meetings: Not on file  . Marital Status: Not on file  Intimate Partner Violence:   . Fear of Current or Ex-Partner: Not on file  . Emotionally Abused: Not on file  . Physically Abused: Not on file  . Sexually Abused: Not on file   Family History  Problem Relation Age of Onset  . Heart attack Father   . Hypertension Father   . Vision loss Sister        one eye  . Cancer Brother        skin  . Diabetes Brother     Objective: Office vital signs reviewed. BP 139/64   Pulse 69   Temp 98.7 F (37.1 C)   Ht 6' (1.829 m)   Wt 203 lb 12.8 oz (92.4 kg)   SpO2 99%   BMI 27.64 kg/m   Physical Examination:  General: Awake, alert, nontoxic appearing, No acute distress HEENT: Normal, sclera white, MMM Cardio: regular rate and rhythm, S1S2 heard, no murmurs appreciated Pulm: clear to auscultation bilaterally, no wheezes, rhonchi or rales; normal work of breathing on room air Extremities: warm, well perfused, No edema, cyanosis or clubbing; +1 pulses bilaterally MSK: Antalgic gait   Assessment/ Plan: 64 y.o. male   1. Type 2 diabetes mellitus with stage 4 chronic kidney disease, without long-term current use of insulin (HCC) A1c with slight rise to 7.  I reinforced need to continue increasing insulin by 1 unit every 2 days for fasting blood sugars above 150.  A new prescription has been sent.  We will see if we can get him into the [facility] and retinopathy clinic in December - [facility] Hb A1c Waived  2. Hypertension associated with diabetes ([place]) Controlled  3. Hyperlipidemia associated with type 2 diabetes mellitus ([place]) Not currently on a statin.  Unsure if this was discontinued secondary to CKD 4.  He is high risk for cardiovascular disease, particular in the setting of amputation of the toe.  We will need  to restart a statin at some point  4. Personal history of diabetic foot ulcer  5. History of amputation of great toe ([place])   No orders of the defined types were placed in this encounter.  Meds ordered this encounter  Medications  . cloNIDine (CATAPRES) 0.3 MG tablet    Sig: Take 1 tablet (0.3 mg total) by mouth 2 (two) times daily.    Dispense:  180 tablet    Refill:  3  . amLODipine (NORVASC) 10 MG tablet    Sig: Take 1 tablet (10  mg total) by mouth daily.    Dispense:  90 tablet    Refill:  3  . insulin degludec (TRESIBA FLEXTOUCH) 100 UNIT/ML FlexTouch Pen    Sig: Inject 30-36 Units into the skin every morning. Increase by one unit every other day until fasting sugar is below 150    Dispense:  12 mL    Refill:  [place], [place] [phone]

## 2020-08-28 MED ORDER — TRESIBA FLEXTOUCH 100 UNIT/ML ~~LOC~~ SOPN: 30.0000 [IU] | PEN_INJECTOR | Freq: Every morning | SUBCUTANEOUS | 2 refills | Status: DC

## 2020-08-28 MED ORDER — AMLODIPINE BESYLATE 10 MG PO TABS: 10.0000 mg | ORAL_TABLET | Freq: Every day | ORAL | 3 refills | Status: DC

## 2020-08-28 MED ORDER — CLONIDINE HCL 0.3 MG PO TABS: 0.3000 mg | ORAL_TABLET | Freq: Two times a day (BID) | ORAL | 3 refills | Status: DC

## 2020-09-10 DIAGNOSIS — E1129 Type 2 diabetes mellitus with other diabetic kidney complication: Secondary | ICD-10-CM | POA: Diagnosis not present

## 2020-09-10 DIAGNOSIS — N189 Chronic kidney disease, unspecified: Secondary | ICD-10-CM | POA: Diagnosis not present

## 2020-09-10 DIAGNOSIS — D631 Anemia in chronic kidney disease: Secondary | ICD-10-CM | POA: Diagnosis not present

## 2020-09-10 DIAGNOSIS — R809 Proteinuria, unspecified: Secondary | ICD-10-CM | POA: Diagnosis not present

## 2020-09-10 DIAGNOSIS — I129 Hypertensive chronic kidney disease with stage 1 through stage 4 chronic kidney disease, or unspecified chronic kidney disease: Secondary | ICD-10-CM | POA: Diagnosis not present

## 2020-09-10 DIAGNOSIS — N170 Acute kidney failure with tubular necrosis: Secondary | ICD-10-CM | POA: Diagnosis not present

## 2020-09-10 DIAGNOSIS — E1122 Type 2 diabetes mellitus with diabetic chronic kidney disease: Secondary | ICD-10-CM | POA: Diagnosis not present

## 2020-09-29 ENCOUNTER — Other Ambulatory Visit: Payer: Self-pay | Admitting: *Deleted

## 2020-09-29 MED ORDER — TAMSULOSIN HCL 0.4 MG PO CAPS
0.4000 mg | ORAL_CAPSULE | Freq: Every day | ORAL | 3 refills | Status: DC
Start: 2020-09-29 — End: 2021-07-24

## 2020-09-30 ENCOUNTER — Ambulatory Visit: Payer: PPO | Admitting: Orthotics

## 2020-09-30 ENCOUNTER — Other Ambulatory Visit: Payer: Self-pay

## 2020-09-30 DIAGNOSIS — M2042 Other hammer toe(s) (acquired), left foot: Secondary | ICD-10-CM | POA: Diagnosis not present

## 2020-09-30 DIAGNOSIS — E1140 Type 2 diabetes mellitus with diabetic neuropathy, unspecified: Secondary | ICD-10-CM | POA: Diagnosis not present

## 2020-09-30 DIAGNOSIS — Z89411 Acquired absence of right great toe: Secondary | ICD-10-CM | POA: Diagnosis not present

## 2020-09-30 DIAGNOSIS — M2041 Other hammer toe(s) (acquired), right foot: Secondary | ICD-10-CM | POA: Diagnosis not present

## 2020-10-15 ENCOUNTER — Other Ambulatory Visit: Payer: Self-pay

## 2020-10-15 LAB — HM DIABETES EYE EXAM

## 2020-10-16 ENCOUNTER — Ambulatory Visit: Payer: PPO | Admitting: Podiatry

## 2020-11-05 ENCOUNTER — Other Ambulatory Visit: Payer: Self-pay

## 2020-11-05 ENCOUNTER — Other Ambulatory Visit: Payer: PPO

## 2020-11-05 DIAGNOSIS — R809 Proteinuria, unspecified: Secondary | ICD-10-CM | POA: Diagnosis not present

## 2020-11-05 DIAGNOSIS — D631 Anemia in chronic kidney disease: Secondary | ICD-10-CM | POA: Diagnosis not present

## 2020-11-05 DIAGNOSIS — N170 Acute kidney failure with tubular necrosis: Secondary | ICD-10-CM | POA: Diagnosis not present

## 2020-11-05 DIAGNOSIS — I129 Hypertensive chronic kidney disease with stage 1 through stage 4 chronic kidney disease, or unspecified chronic kidney disease: Secondary | ICD-10-CM | POA: Diagnosis not present

## 2020-11-05 DIAGNOSIS — E1122 Type 2 diabetes mellitus with diabetic chronic kidney disease: Secondary | ICD-10-CM | POA: Diagnosis not present

## 2020-11-05 DIAGNOSIS — N189 Chronic kidney disease, unspecified: Secondary | ICD-10-CM | POA: Diagnosis not present

## 2020-11-05 DIAGNOSIS — E1129 Type 2 diabetes mellitus with other diabetic kidney complication: Secondary | ICD-10-CM | POA: Diagnosis not present

## 2020-11-12 ENCOUNTER — Telehealth: Payer: Self-pay | Admitting: *Deleted

## 2020-11-12 DIAGNOSIS — N170 Acute kidney failure with tubular necrosis: Secondary | ICD-10-CM | POA: Diagnosis not present

## 2020-11-12 DIAGNOSIS — I129 Hypertensive chronic kidney disease with stage 1 through stage 4 chronic kidney disease, or unspecified chronic kidney disease: Secondary | ICD-10-CM | POA: Diagnosis not present

## 2020-11-12 DIAGNOSIS — N189 Chronic kidney disease, unspecified: Secondary | ICD-10-CM | POA: Diagnosis not present

## 2020-11-12 DIAGNOSIS — E1122 Type 2 diabetes mellitus with diabetic chronic kidney disease: Secondary | ICD-10-CM | POA: Diagnosis not present

## 2020-11-12 DIAGNOSIS — E1129 Type 2 diabetes mellitus with other diabetic kidney complication: Secondary | ICD-10-CM | POA: Diagnosis not present

## 2020-11-12 DIAGNOSIS — R809 Proteinuria, unspecified: Secondary | ICD-10-CM | POA: Diagnosis not present

## 2020-11-12 DIAGNOSIS — D631 Anemia in chronic kidney disease: Secondary | ICD-10-CM | POA: Diagnosis not present

## 2020-11-12 NOTE — Chronic Care Management (AMB) (Signed)
  Chronic Care Management   Note  11/12/2020 Name: [name] MRN: [id number] DOB: June 06, 1956  [name] is a 64 y.o. year old male who is a primary care patient of [name], DO. I reached out to [name] by phone today in response to a referral sent by [name]'s health plan.     [name] was given information about Chronic Care Management services today including:  1. CCM service includes personalized support from designated clinical staff supervised by his physician, including individualized plan of care and coordination with other care providers 2. 24/7 contact phone numbers for assistance for urgent and routine care needs. 3. Service will only be billed when office clinical staff spend 20 minutes or more in a month to coordinate care. 4. Only one practitioner may furnish and bill the service in a calendar month. 5. The patient may stop CCM services at any time (effective at the end of the month) by phone call to the office staff. 6. The patient will be responsible for cost sharing (co-pay) of up to 20% of the service fee (after annual deductible is met).  Patient agreed to services and verbal consent obtained.   Follow up plan: Telephone appointment with care management team member scheduled for:12/01/2020  [facility]  Direct Dial: [phone]

## 2020-11-13 ENCOUNTER — Other Ambulatory Visit: Payer: Self-pay | Admitting: Podiatry

## 2020-11-19 ENCOUNTER — Other Ambulatory Visit: Payer: Self-pay | Admitting: Podiatry

## 2020-11-19 ENCOUNTER — Telehealth: Payer: Self-pay | Admitting: Podiatry

## 2020-11-19 MED ORDER — GABAPENTIN 100 MG PO CAPS
100.0000 mg | ORAL_CAPSULE | Freq: Two times a day (BID) | ORAL | 0 refills | Status: DC
Start: 2020-11-19 — End: 2021-03-30

## 2020-11-19 NOTE — Telephone Encounter (Signed)
The pharmacy is trying to refill a prescripton of Gabapentin. It was sent to [facility] but [name] is his doctor.

## 2020-11-19 NOTE — Telephone Encounter (Signed)
sent 

## 2020-11-21 NOTE — Telephone Encounter (Signed)
Please advise 

## 2020-11-28 ENCOUNTER — Other Ambulatory Visit: Payer: Self-pay

## 2020-11-28 ENCOUNTER — Ambulatory Visit (INDEPENDENT_AMBULATORY_CARE_PROVIDER_SITE_OTHER): Payer: PPO | Admitting: Family Medicine

## 2020-11-28 VITALS — BP 140/80 | HR 70 | Temp 98.3°F | Ht 72.0 in | Wt 212.0 lb

## 2020-11-28 DIAGNOSIS — E1122 Type 2 diabetes mellitus with diabetic chronic kidney disease: Secondary | ICD-10-CM | POA: Diagnosis not present

## 2020-11-28 DIAGNOSIS — Z1211 Encounter for screening for malignant neoplasm of colon: Secondary | ICD-10-CM | POA: Diagnosis not present

## 2020-11-28 DIAGNOSIS — J3489 Other specified disorders of nose and nasal sinuses: Secondary | ICD-10-CM | POA: Diagnosis not present

## 2020-11-28 DIAGNOSIS — E1159 Type 2 diabetes mellitus with other circulatory complications: Secondary | ICD-10-CM

## 2020-11-28 DIAGNOSIS — N184 Chronic kidney disease, stage 4 (severe): Secondary | ICD-10-CM

## 2020-11-28 DIAGNOSIS — I152 Hypertension secondary to endocrine disorders: Secondary | ICD-10-CM

## 2020-11-28 LAB — BAYER DCA HB A1C WAIVED: HB A1C (BAYER DCA - WAIVED): 6.9 % (ref <7.0)

## 2020-11-28 MED ORDER — LORATADINE 10 MG PO TABS: 10.0000 mg | ORAL_TABLET | ORAL | 11 refills | Status: DC

## 2020-11-28 NOTE — Patient Instructions (Signed)
All meds should have refills on them.  Claritin EVERY OTHER Day for drainage

## 2020-11-28 NOTE — Progress Notes (Signed)
Subjective: CC: DM PCP: [name], DO OVZ:CHYIFOY [name] is a 65 y.o. male presenting to clinic today for:  1. Type 2 Diabetes with hypertension, hyperlipidemia, CKD4?:  Patient reports compliance with insulin.  Blood sugars have been running relatively well.  Home blood pressures are in the 130s over 70s.  No chest pain, shortness of breath.  Has had a little bit of sinus congestion and drainage.  Not currently on any oral antihistamines.  He continues to follow closely with Dr. [name] with labs obtained in the last week.  He was told that everything looks good at this point.  Last eye exam: Up-to-date Last foot exam: Up-to-date Last A1c:  Lab Results  Component Value Date   HGBA1C 7.0 (H) 08/27/2020   Nephropathy screen indicated?:  Up-to-date Last flu, zoster and/or pneumovax:  Immunization History  Administered Date(s) Administered  . Influenza,inj,Quad PF,6+ Mos 08/25/2017, 09/01/2018  . Influenza,inj,quad, With Preservative 09/15/2017, 09/11/2018  . Pneumococcal Polysaccharide-23 04/14/2017    ROS: Per HPI  No Known Allergies Past Medical History:  Diagnosis Date  . Chronic kidney disease   . Complication of anesthesia    Hard to wake  . Degenerative joint disease (DJD) of lumbar spine   . Diabetes mellitus without complication ([place])    Type II  . Essential hypertension 02/23/2017  . Hypertension   . Lumbar stenosis   . Proteinuria   . Radicular leg pain     Current Outpatient Medications:  .  amLODipine (NORVASC) 10 MG tablet, Take 1 tablet (10 mg total) by mouth daily., Disp: 90 tablet, Rfl: 3 .  cholecalciferol (VITAMIN D3) 25 MCG (1000 UNIT) tablet, Take 2,000 Units by mouth daily., Disp: , Rfl:  .  cloNIDine (CATAPRES) 0.3 MG tablet, Take 1 tablet (0.3 mg total) by mouth 2 (two) times daily., Disp: 180 tablet, Rfl: 3 .  ferrous sulfate 325 (65 FE) MG tablet, Take 325 mg by mouth daily with breakfast., Disp: , Rfl:  .  gabapentin  (NEURONTIN) 100 MG capsule, TAKE (1) CAPSULE THREE TIMES DAILY., Disp: 90 capsule, Rfl: 0 .  gabapentin (NEURONTIN) 100 MG capsule, Take 1 capsule (100 mg total) by mouth 2 (two) times daily., Disp: 180 capsule, Rfl: 0 .  insulin degludec (TRESIBA FLEXTOUCH) 100 UNIT/ML FlexTouch Pen, Inject 30-36 Units into the skin every morning. Increase by one unit every other day until fasting sugar is below 150, Disp: 12 mL, Rfl: 2 .  sildenafil (VIAGRA) 25 MG tablet, Take 1-2 tablets (25-50 mg total) by mouth daily as needed for erectile dysfunction., Disp: 10 tablet, Rfl: 3 .  tamsulosin (FLOMAX) 0.4 MG CAPS capsule, Take 1 capsule (0.4 mg total) by mouth daily after supper., Disp: 90 capsule, Rfl: 3 Social History   Socioeconomic History  . Marital status: Married    Spouse name: Not on file  . Number of children: Not on file  . Years of education: Not on file  . Highest education level: Not on file  Occupational History  . Not on file  Tobacco Use  . Smoking status: Never Smoker  . Smokeless tobacco: Never Used  Vaping Use  . Vaping Use: Never used  Substance and Sexual Activity  . Alcohol use: Yes    Alcohol/week: 6.0 standard drinks    Types: 6 Cans of beer per week  . Drug use: No  . Sexual activity: Yes  Other Topics Concern  . Not on file  Social History Narrative  . Not on file  Social Determinants of Health   Financial Resource Strain: Not on file  Food Insecurity: Not on file  Transportation Needs: Not on file  Physical Activity: Not on file  Stress: Not on file  Social Connections: Not on file  Intimate Partner Violence: Not on file   Family History  Problem Relation Age of Onset  . Heart attack Father   . Hypertension Father   . Vision loss Sister        one eye  . Cancer Brother        skin  . Diabetes Brother     Objective: Office vital signs reviewed. BP (!) 197/50   Pulse 70   Temp 98.3 F (36.8 C) (Temporal)   Ht 6' (1.829 m)   Wt 212 lb (96.2 kg)    SpO2 98%   BMI 28.75 kg/m   Physical Examination:  General: Awake, alert, well nourished, No acute distress HEENT: Normal, sclera white, MMM Cardio: regular rate and rhythm, S1S2 heard, no murmurs appreciated Pulm: clear to auscultation bilaterally, no wheezes, rhonchi or rales; normal work of breathing on room air  Assessment/ Plan: 65 y.o. male   Type 2 diabetes mellitus with stage 4 chronic kidney disease, without long-term current use of insulin ([place]) - Plan: [facility] DCA Hb A1c Waived, CANCELED: Renal Function Panel  Hypertension associated with diabetes ([place]) - Plan: CANCELED: CBC with Differential/Platelet  Colon cancer screening - Plan: Cologuard  Rhinorrhea  Diabetes is well controlled with A1c at 6.9 today.  Continue current regimen  Blood pressure upon recheck was at goal.  Continue current regimen  I have ordered Cologuard for colon cancer screening since he is overdue for this.  Previous colonoscopy was normal per his report he appears to be average risk  I have ordered Claritin every other day for allergy management.  I cannot see the last set of labs that he had done so I have based this on his previous laboratory eval.  Orders Placed This Encounter  Procedures  . [facility] DCA Hb A1c Waived   No orders of the defined types were placed in this encounter.    [name], DO [place] [phone]

## 2020-12-01 ENCOUNTER — Ambulatory Visit: Payer: PPO | Admitting: *Deleted

## 2020-12-01 DIAGNOSIS — E1122 Type 2 diabetes mellitus with diabetic chronic kidney disease: Secondary | ICD-10-CM

## 2020-12-01 DIAGNOSIS — E1159 Type 2 diabetes mellitus with other circulatory complications: Secondary | ICD-10-CM

## 2020-12-01 DIAGNOSIS — N184 Chronic kidney disease, stage 4 (severe): Secondary | ICD-10-CM

## 2020-12-01 DIAGNOSIS — I152 Hypertension secondary to endocrine disorders: Secondary | ICD-10-CM

## 2020-12-01 NOTE — Patient Instructions (Signed)
Visit Information  Goals Addressed            This Visit's Progress   . Monitor and Manage My Blood Sugar-Diabetes Type 2       Timeframe:  Long-Range Goal Priority:  Medium Start Date:                             Expected End Date:                       Follow Up Date 02/09/21    . check blood sugar at prescribed times . check blood sugar if I feel it is too high or too low . enter blood sugar readings and medication or insulin into daily log . take the blood sugar log to all doctor visits . take the blood sugar meter to all doctor visits  . Call PCP with any readings outside of recommended range . Eat a healthy diet. Limit sugar and simple carbohydrates.  . Fill plate halfway with vegetables . Eat lean meats   Why is this important?    Checking your blood sugar at home helps to keep it from getting very high or very low.   Writing the results in a diary or log helps the doctor know how to care for you.   Your blood sugar log should have the time, date and the results.   Also, write down the amount of insulin or other medicine that you take.   Other information, like what you ate, exercise done and how you were feeling, will also be helpful.     Notes:     . Obtain Eye Exam-Diabetes Type 2       Follow Up Date 02/09/21    . schedule appointment with eye doctor    Why is this important?    Eye check-ups are important when you have diabetes.   Vision loss can be prevented.    Notes:     . Track and Manage My Blood Pressure-Hypertension       Timeframe:  Long-Range Goal Priority:  Medium Start Date:                             Expected End Date:                       Follow Up Date 02/09/21    . check blood pressure 3 times per week . write blood pressure results in a log or diary  . Take blood pressure log to PCP appointments . Call PCP with any readings outside of recommended range   Why is this important?    You won't feel high blood pressure, but it  can still hurt your blood vessels.   High blood pressure can cause heart or kidney problems. It can also cause a stroke.   Making lifestyle changes like losing a little weight or eating less salt will help.   Checking your blood pressure at home and at different times of the day can help to control blood pressure.   If the doctor prescribes medicine remember to take it the way the doctor ordered.   Call the office if you cannot afford the medicine or if there are questions about it.     Notes:         Patient Care Plan: RNCM:Diabetes Type 2 (  Adult)    Problem Identified: Diabetes Management   Priority: Medium    Long-Range Goal: Manage Blood Sugar and Limit Diabetes Complications   This Visit's Progress: On track  Priority: Medium  Note:   Current Barriers:  . Chronic Disease Management support and education needs related to diabetes . Literacy barriers . Overdue for diabetic eye exam  Nurse Case Manager Clinical Goal(s):  Marland Kitchen Over the next 90 days, patient will verbalize understanding of plan for diabetes  . Over the next 90 days, patient will work with Consulting civil engineer to address needs related to diabetes self-management . Over the next 30 days, patient will schedule a diabetic eye exam with his eye care provider  Interventions:  . 1:1 collaboration with [name], DO regarding development and update of comprehensive plan of care as evidenced by provider attestation and co-signature . Inter-disciplinary care team collaboration (see longitudinal plan of care) . Evaluation of current treatment plan related to diabetes and patient's adherence to plan as established by provider. . Chart reviewed including relevant office notes and lab results . Discussed A1C goal and recent results o 6.9% . Discussed home blood sugar readings o Checking blood sugar each morning o Typically below 150 o Last 3 days were 159, 140, 116 . Reviewed and discussed medications o Injecting  [place] each morning o Medications are affordable . Discussed diet o Does not follow a special diet o Does not eat fruits or vegetables daily o Mainly eats meat and bread (raises his own before) o Doesn't drink sweetened drinks o Encouraged to increase vegetable intake o Encouraged to limit simple carbs and be careful of eating too many high sugar fruits . 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  . Encouraged patient to continue daily foot checks and report any sores or calluses  . Consulted with [facility] regarding Diabetic Retinopathy Screening Report o Report was inconclusive  . Advised patient to schedule a diabetic eye exam with eye care provider . Encouraged patient to reach out to PCP with any blood sugar readings outside of recommended range . Encouraged patient to reach out to [facility] as needed  Patient Goals/Self-Care Activities Over the next 90 days, patient will: . check blood sugar at prescribed times . check blood sugar if I feel it is too high or too low . enter blood sugar readings and medication or insulin into daily log . take the blood sugar log to all doctor visits . take the blood sugar meter to all doctor visits  . Call PCP with any readings outside of recommended range . Eat a healthy diet. Limit sugar and simple carbohydrates.  . Fill plate halfway with vegetables . Eat lean meats . Schedule an appt with eye doctor for diabetic eye exam   Follow Up Plan:  . Telephone follow up appointment with care management team member scheduled for: 02/09/21 with RN Care Manager . The patient has been provided with contact information for the care management team and has been advised to call with any health related questions or concerns.  . Next PCP appointment scheduled for: 03/30/2021 with Dr  Lajuana Ripple       Patient Care Plan: RNCM:Hypertension (Adult)    Problem Identified: Hypertension   Priority: Medium    Long-Range Goal: Hypertension Monitored   This Visit's Progress: Not on track  Priority: Medium  Note:   Current Barriers:  . Chronic Disease Management support and education needs related to hypertension . Literacy barriers  Nurse Case Manager Clinical Goal(s):  Marland Kitchen Over the next 90 days, patient will verbalize understanding of plan for hypertension management . Over the next 90 days, patient will work with Consulting civil engineer to address needs related to self-management of hypertension . Over the next 30 days, patient will check and record blood pressure 3 times a week and will bring log to provider appt  Interventions:  . 1:1 collaboration with [name], DO regarding development and update of comprehensive plan of care as evidenced by provider attestation and co-signature . Inter-disciplinary care team collaboration (see longitudinal plan of care) . Evaluation of current treatment plan related to hypertension and patient's adherence to plan as established by provider. . Chart reviewed including relevant office notes and lab results . Discussed recent in-office blood pressure readings . Discussed medications o No problems with medications and medications are affordable . Discussed home blood pressure readings o Patient is not checking blood pressure regularly but has a monitor . Encouraged patient to check and record blood pressure three times a week and PRN . Advised patient to bring blood pressure log to PCP visit . Advised patient to call PCP with any readings outside of recommended range . 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 Reviewed upcoming appt with PCP in May 2022 . Encouraged patient to Call [facility] as  needed  Patient Goals/Self-Care Activities Over the next 90 days, patient will: . check blood pressure 3 times per week . write blood pressure results in a log or diary  . Take blood pressure log to PCP appointments . Call PCP with any readings outside of recommended range  Follow Up Plan:  . Telephone follow up appointment with care management team member scheduled for: 02/09/21 with RN Care Manager . The patient has been provided with contact information for the care management team and has been advised to call with any health related questions or concerns.  . Next PCP appointment scheduled for: 03/30/2021 with Dr Lajuana Ripple         Patient verbalizes understanding of instructions provided today.  Follow Up Plan:  . Telephone follow up appointment with care management team member scheduled for: 02/09/21 with RN Care Manager . The patient has been provided with contact information for the care management team and has been advised to call with any health related questions or concerns.  . Next PCP appointment scheduled for: 03/30/2021 with Dr Lajuana Ripple  Chong Sicilian, BSN, RN-BC [place] [facility] [place] Management Direct Dial: [phone]

## 2020-12-01 NOTE — Chronic Care Management (AMB) (Signed)
Chronic Care Management   CCM RN Visit Note  12/01/2020 Name: [name] MRN: [id number] DOB: 1956-05-15  Subjective: [name] is a 65 y.o. year old male who is a primary care patient of [name], DO. The care management team was consulted for assistance with disease management and care coordination needs.    Engaged with patient by telephone for initial visit in response to provider referral for case management and/or care coordination services.   Consent to Services:  The patient was given the following information about Chronic Care Management services today, agreed to services, and gave verbal consent: 1. CCM service includes personalized support from designated clinical staff supervised by the primary care provider, including individualized plan of care and coordination with other care providers 2. 24/7 contact phone numbers for assistance for urgent and routine care needs. 3. Service will only be billed when office clinical staff spend 20 minutes or more in a month to coordinate care. 4. Only one practitioner may furnish and bill the service in a calendar month. 5.The patient may stop CCM services at any time (effective at the end of the month) by phone call to the office staff. 6. The patient will be responsible for cost sharing (co-pay) of up to 20% of the service fee (after annual deductible is met). Patient agreed to services and consent obtained.  Patient agreed to services and verbal consent obtained.   Assessment: Review of patient past medical history, allergies, medications, health status, including review of consultants reports, laboratory and other test data, was performed as part of comprehensive evaluation and provision of chronic care management services.   SDOH (Social Determinants of Health) assessments and interventions performed:    CCM Care Plan  No Known Allergies  Outpatient Encounter Medications as of 12/01/2020  Medication Sig Note   . amLODipine (NORVASC) 10 MG tablet Take 1 tablet (10 mg total) by mouth daily.   . cloNIDine (CATAPRES) 0.3 MG tablet Take 1 tablet (0.3 mg total) by mouth 2 (two) times daily.   . ferrous sulfate 325 (65 FE) MG tablet Take 325 mg by mouth daily with breakfast. 12/01/2020: Not taking regularly  . gabapentin (NEURONTIN) 100 MG capsule TAKE (1) CAPSULE THREE TIMES DAILY.   Marland Kitchen gabapentin (NEURONTIN) 100 MG capsule Take 1 capsule (100 mg total) by mouth 2 (two) times daily.   . insulin degludec (TRESIBA FLEXTOUCH) 100 UNIT/ML FlexTouch Pen Inject 30-36 Units into the skin every morning. Increase by one unit every other day until fasting sugar is below 150   . loratadine (CLARITIN) 10 MG tablet Take 1 tablet (10 mg total) by mouth every other day. (renally dosed)   . sildenafil (VIAGRA) 25 MG tablet Take 1-2 tablets (25-50 mg total) by mouth daily as needed for erectile dysfunction.   . tamsulosin (FLOMAX) 0.4 MG CAPS capsule Take 1 capsule (0.4 mg total) by mouth daily after supper.   . cholecalciferol (VITAMIN D3) 25 MCG (1000 UNIT) tablet Take 2,000 Units by mouth daily.    No facility-administered encounter medications on file as of 12/01/2020.    Patient Active Problem List   Diagnosis Date Noted  . Personal history of diabetic foot ulcer 06/18/2020  . History of amputation of great toe ([place]) 06/18/2020  . Osteomyelitis of great toe of right foot ([place])   . Diabetic ulcer of toe of right foot associated with diabetes mellitus due to underlying condition, with necrosis of bone ([place])   . Osteomyelitis ([place]) 05/15/2020  .  AKI (acute kidney injury) ([place]) 05/15/2020  . OSA (obstructive sleep apnea) 05/15/2020  . Cellulitis of right foot 05/07/2020  . Type 2 diabetes mellitus ([place]) 05/07/2020  . Cellulitis of right lower extremity   . Diabetic infection of right foot ([place])   . Cellulitis of great toe, right   . Diabetic ulcer of toe of right foot associated with type 2 diabetes mellitus, with  fat layer exposed ([place]) 04/15/2020  . OSA on CPAP 09/01/2018  . S/P lumbar fusion 07/06/2018  . DDD (degenerative disc disease), lumbar 02/28/2018  . Hypertension associated with diabetes ([place]) 02/23/2017  . Benign prostatic hyperplasia with incomplete bladder emptying 02/23/2017  . Vitamin D deficiency 02/23/2017  . Hyperlipidemia associated with type 2 diabetes mellitus ([place]) 02/23/2017  . Type 2 diabetes mellitus with stage 2 chronic kidney disease, without long-term current use of insulin ([place]) 02/23/2017    Conditions to be addressed/monitored:HTN, DMII and CKD  Care Plan : RNCM:Diabetes Type 2 (Adult)  Updates made by Ilean China, RN since 12/01/2020 12:00 AM    Problem: Diabetes Management   Priority: Medium    Long-Range Goal: Manage Blood Sugar and Limit Diabetes Complications   This Visit's Progress: On track  Priority: Medium  Note:   Current Barriers:  . Chronic Disease Management support and education needs related to diabetes . Literacy barriers . Overdue for diabetic eye exam  Nurse Case Manager Clinical Goal(s):  Marland Kitchen Over the next 90 days, patient will verbalize understanding of plan for diabetes  . Over the next 90 days, patient will work with Consulting civil engineer to address needs related to diabetes self-management . Over the next 30 days, patient will schedule a diabetic eye exam with his eye care provider  Interventions:  . 1:1 collaboration with [name], DO regarding development and update of comprehensive plan of care as evidenced by provider attestation and co-signature . Inter-disciplinary care team collaboration (see longitudinal plan of care) . Evaluation of current treatment plan related to diabetes and patient's adherence to plan as established by provider. . Chart reviewed including relevant office notes and lab results . Discussed A1C goal and recent results o 6.9% . Discussed home blood sugar readings o Checking blood sugar each  morning o Typically below 150 o Last 3 days were 159, 140, 116 . Reviewed and discussed medications o Injecting [place] each morning o Medications are affordable . Discussed diet o Does not follow a special diet o Does not eat fruits or vegetables daily o Mainly eats meat and bread (raises his own before) o Doesn't drink sweetened drinks o Encouraged to increase vegetable intake o Encouraged to limit simple carbs and be careful of eating too many high sugar fruits . 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  . Encouraged patient to continue daily foot checks and report any sores or calluses  . Consulted with [facility] Quality Dept regarding Diabetic Retinopathy Screening Report o Report was inconclusive  . Advised patient to schedule a diabetic eye exam with eye care provider . Encouraged patient to reach out to PCP with any blood sugar readings outside of recommended range . Encouraged patient to reach out to [facility] as needed  Patient Goals/Self-Care Activities Over the next 90 days, patient will: . check blood sugar at prescribed times . check blood sugar if I feel it is  too high or too low . enter blood sugar readings and medication or insulin into daily log . take the blood sugar log to all doctor visits . take the blood sugar meter to all doctor visits  . Call PCP with any readings outside of recommended range . Eat a healthy diet. Limit sugar and simple carbohydrates.  . Fill plate halfway with vegetables . Eat lean meats . Schedule an appt with eye doctor for diabetic eye exam     Care Plan : RNCM:Hypertension (Adult)  Updates made by Ilean China, RN since 12/01/2020 12:00 AM    Problem: Hypertension   Priority: Medium    Long-Range Goal: Hypertension Monitored   This Visit's Progress: Not on track  Priority:  Medium  Note:   Current Barriers:  . Chronic Disease Management support and education needs related to hypertension . Literacy barriers  Nurse Case Manager Clinical Goal(s):  Marland Kitchen Over the next 90 days, patient will verbalize understanding of plan for hypertension management . Over the next 90 days, patient will work with Consulting civil engineer to address needs related to self-management of hypertension . Over the next 30 days, patient will check and record blood pressure 3 times a week and will bring log to provider appt  Interventions:  . 1:1 collaboration with [name], DO regarding development and update of comprehensive plan of care as evidenced by provider attestation and co-signature . Inter-disciplinary care team collaboration (see longitudinal plan of care) . Evaluation of current treatment plan related to hypertension and patient's adherence to plan as established by provider. . Chart reviewed including relevant office notes and lab results . Discussed recent in-office blood pressure readings . Discussed medications o No problems with medications and medications are affordable . Discussed home blood pressure readings o Patient is not checking blood pressure regularly but has a monitor . Encouraged patient to check and record blood pressure three times a week and PRN . Advised patient to bring blood pressure log to PCP visit . Advised patient to call PCP with any readings outside of recommended range . 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 Reviewed upcoming appt with PCP in May 2022 . Encouraged patient to Call [facility] as needed  Patient Goals/Self-Care Activities Over the next 90 days, patient will: . check blood pressure 3 times per week . write blood pressure results in a log or diary  . Take blood pressure log to PCP  appointments . Call PCP with any readings outside of recommended range     Follow Up Plan:  . Telephone follow up appointment with care management team member scheduled for: 02/09/21 with RN Care Manager . The patient has been provided with contact information for the care management team and has been advised to call with any health related questions or concerns.  . Next PCP appointment scheduled for: 03/30/2021 with Dr Lajuana Ripple  Chong Sicilian, BSN, RN-BC [place] / [place] Management Direct Dial: [phone]

## 2020-12-09 ENCOUNTER — Telehealth: Payer: Self-pay

## 2020-12-09 MED ORDER — PEN NEEDLES 31G X 5 MM MISC: 3 refills | Status: DC

## 2020-12-09 NOTE — Telephone Encounter (Signed)
Pen needles sent to pharmacy.

## 2021-01-07 ENCOUNTER — Other Ambulatory Visit: Payer: PPO

## 2021-01-07 ENCOUNTER — Other Ambulatory Visit: Payer: Self-pay

## 2021-01-07 DIAGNOSIS — D631 Anemia in chronic kidney disease: Secondary | ICD-10-CM | POA: Diagnosis not present

## 2021-01-07 DIAGNOSIS — R809 Proteinuria, unspecified: Secondary | ICD-10-CM | POA: Diagnosis not present

## 2021-01-07 DIAGNOSIS — E1122 Type 2 diabetes mellitus with diabetic chronic kidney disease: Secondary | ICD-10-CM | POA: Diagnosis not present

## 2021-01-07 DIAGNOSIS — E1129 Type 2 diabetes mellitus with other diabetic kidney complication: Secondary | ICD-10-CM | POA: Diagnosis not present

## 2021-01-07 DIAGNOSIS — N170 Acute kidney failure with tubular necrosis: Secondary | ICD-10-CM | POA: Diagnosis not present

## 2021-01-07 DIAGNOSIS — N189 Chronic kidney disease, unspecified: Secondary | ICD-10-CM | POA: Diagnosis not present

## 2021-01-07 DIAGNOSIS — I129 Hypertensive chronic kidney disease with stage 1 through stage 4 chronic kidney disease, or unspecified chronic kidney disease: Secondary | ICD-10-CM | POA: Diagnosis not present

## 2021-01-14 DIAGNOSIS — E1122 Type 2 diabetes mellitus with diabetic chronic kidney disease: Secondary | ICD-10-CM | POA: Diagnosis not present

## 2021-01-14 DIAGNOSIS — R809 Proteinuria, unspecified: Secondary | ICD-10-CM | POA: Diagnosis not present

## 2021-01-14 DIAGNOSIS — E1129 Type 2 diabetes mellitus with other diabetic kidney complication: Secondary | ICD-10-CM | POA: Diagnosis not present

## 2021-01-14 DIAGNOSIS — R808 Other proteinuria: Secondary | ICD-10-CM | POA: Diagnosis not present

## 2021-01-14 DIAGNOSIS — D631 Anemia in chronic kidney disease: Secondary | ICD-10-CM | POA: Diagnosis not present

## 2021-01-14 DIAGNOSIS — I129 Hypertensive chronic kidney disease with stage 1 through stage 4 chronic kidney disease, or unspecified chronic kidney disease: Secondary | ICD-10-CM | POA: Diagnosis not present

## 2021-01-14 DIAGNOSIS — N189 Chronic kidney disease, unspecified: Secondary | ICD-10-CM | POA: Diagnosis not present

## 2021-01-28 ENCOUNTER — Other Ambulatory Visit: Payer: Self-pay

## 2021-01-28 ENCOUNTER — Other Ambulatory Visit: Payer: PPO

## 2021-01-28 DIAGNOSIS — R809 Proteinuria, unspecified: Secondary | ICD-10-CM | POA: Diagnosis not present

## 2021-01-28 DIAGNOSIS — I129 Hypertensive chronic kidney disease with stage 1 through stage 4 chronic kidney disease, or unspecified chronic kidney disease: Secondary | ICD-10-CM | POA: Diagnosis not present

## 2021-01-28 DIAGNOSIS — R808 Other proteinuria: Secondary | ICD-10-CM | POA: Diagnosis not present

## 2021-01-28 DIAGNOSIS — E1129 Type 2 diabetes mellitus with other diabetic kidney complication: Secondary | ICD-10-CM | POA: Diagnosis not present

## 2021-01-28 DIAGNOSIS — E1122 Type 2 diabetes mellitus with diabetic chronic kidney disease: Secondary | ICD-10-CM | POA: Diagnosis not present

## 2021-01-28 DIAGNOSIS — D631 Anemia in chronic kidney disease: Secondary | ICD-10-CM | POA: Diagnosis not present

## 2021-01-28 DIAGNOSIS — N189 Chronic kidney disease, unspecified: Secondary | ICD-10-CM | POA: Diagnosis not present

## 2021-02-09 ENCOUNTER — Ambulatory Visit (INDEPENDENT_AMBULATORY_CARE_PROVIDER_SITE_OTHER): Payer: PPO | Admitting: *Deleted

## 2021-02-09 DIAGNOSIS — N184 Chronic kidney disease, stage 4 (severe): Secondary | ICD-10-CM | POA: Diagnosis not present

## 2021-02-09 DIAGNOSIS — I152 Hypertension secondary to endocrine disorders: Secondary | ICD-10-CM | POA: Diagnosis not present

## 2021-02-09 DIAGNOSIS — E1122 Type 2 diabetes mellitus with diabetic chronic kidney disease: Secondary | ICD-10-CM | POA: Diagnosis not present

## 2021-02-09 DIAGNOSIS — E1159 Type 2 diabetes mellitus with other circulatory complications: Secondary | ICD-10-CM

## 2021-02-09 NOTE — Chronic Care Management (AMB) (Signed)
Chronic Care Management   CCM RN Visit Note  02/09/2021 Name: [name] MRN: [id number] DOB: November 24, 1955  Subjective: [name] is a 65 y.o. year old male who is a primary care patient of [name], DO. The care management team was consulted for assistance with disease management and care coordination needs.    Engaged with patient by telephone for follow up visit in response to provider referral for case management and/or care coordination services.   Consent to Services:  The patient was given information about Chronic Care Management services, agreed to services, and gave verbal consent prior to initiation of services.  Please see initial visit note for detailed documentation.   Patient agreed to services and verbal consent obtained.   Assessment: Review of patient past medical history, allergies, medications, health status, including review of consultants reports, laboratory and other test data, was performed as part of comprehensive evaluation and provision of chronic care management services.   SDOH (Social Determinants of Health) assessments and interventions performed:    CCM Care Plan  No Known Allergies  Outpatient Encounter Medications as of 02/09/2021  Medication Sig Note  . amLODipine (NORVASC) 10 MG tablet Take 1 tablet (10 mg total) by mouth daily.   . cholecalciferol (VITAMIN D3) 25 MCG (1000 UNIT) tablet Take 2,000 Units by mouth daily.   . cloNIDine (CATAPRES) 0.3 MG tablet Take 1 tablet (0.3 mg total) by mouth 2 (two) times daily.   . ferrous sulfate 325 (65 FE) MG tablet Take 325 mg by mouth daily with breakfast. 12/01/2020: Not taking regularly  . gabapentin (NEURONTIN) 100 MG capsule TAKE (1) CAPSULE THREE TIMES DAILY.   Marland Kitchen gabapentin (NEURONTIN) 100 MG capsule Take 1 capsule (100 mg total) by mouth 2 (two) times daily.   . insulin degludec (TRESIBA FLEXTOUCH) 100 UNIT/ML FlexTouch Pen Inject 30-36 Units into the skin every morning.  Increase by one unit every other day until fasting sugar is below 150   . Insulin Pen Needle (PEN NEEDLES) 31G X 5 MM MISC Use daily with insulin Dx E11.9   . loratadine (CLARITIN) 10 MG tablet Take 1 tablet (10 mg total) by mouth every other day. (renally dosed)   . sildenafil (VIAGRA) 25 MG tablet Take 1-2 tablets (25-50 mg total) by mouth daily as needed for erectile dysfunction.   . tamsulosin (FLOMAX) 0.4 MG CAPS capsule Take 1 capsule (0.4 mg total) by mouth daily after supper.    No facility-administered encounter medications on file as of 02/09/2021.    Patient Active Problem List   Diagnosis Date Noted  . Personal history of diabetic foot ulcer 06/18/2020  . History of amputation of great toe ([place]) 06/18/2020  . Osteomyelitis of great toe of right foot ([place])   . Diabetic ulcer of toe of right foot associated with diabetes mellitus due to underlying condition, with necrosis of bone ([place])   . Osteomyelitis ([place]) 05/15/2020  . AKI (acute kidney injury) ([place]) 05/15/2020  . OSA (obstructive sleep apnea) 05/15/2020  . Cellulitis of right foot 05/07/2020  . Type 2 diabetes mellitus ([place]) 05/07/2020  . Cellulitis of right lower extremity   . Diabetic infection of right foot ([place])   . Cellulitis of great toe, right   . Diabetic ulcer of toe of right foot associated with type 2 diabetes mellitus, with fat layer exposed ([place]) 04/15/2020  . OSA on CPAP 09/01/2018  . S/P lumbar fusion 07/06/2018  . DDD (degenerative disc disease), lumbar 02/28/2018  .  Hypertension associated with diabetes ([place]) 02/23/2017  . Benign prostatic hyperplasia with incomplete bladder emptying 02/23/2017  . Vitamin D deficiency 02/23/2017  . Hyperlipidemia associated with type 2 diabetes mellitus ([place]) 02/23/2017  . Type 2 diabetes mellitus with stage 2 chronic kidney disease, without long-term current use of insulin ([place]) 02/23/2017    Conditions to be addressed/monitored:HTN, DMII and CKD  Care Plan :  RNCM:Diabetes Type 2 (Adult)  Updates made by Ilean China, RN since 02/09/2021 12:00 AM    Problem: Diabetes Management   Priority: Medium    Long-Range Goal: Manage Blood Sugar and Limit Diabetes Complications   This Visit's Progress: On track  Recent Progress: On track  Priority: Medium  Note:   Objective: Lab Results  Component Value Date   HGBA1C 6.9 11/28/2020   HGBA1C 7.0 (H) 08/27/2020   HGBA1C 6.7 (H) 05/06/2020   Lab Results  Component Value Date   LDLCALC 71 10/01/2019   CREATININE 2.41 (H) 05/18/2020   Current Barriers:  . Chronic Disease Management support and education needs related to diabetes . Literacy barriers . Overdue for diabetic eye exam  Nurse Case Manager Clinical Goal(s):  . patient will work with PCP to address needs related to medical management of diabetes . patient will meet with RN Care Manager to address self-management of diabetes . Patient will follow-up with nephrologist regarding recent lab results and medication changes  Interventions:  . 1:1 collaboration with [name], DO regarding development and update of comprehensive plan of care as evidenced by provider attestation and co-signature . Inter-disciplinary care team collaboration (see longitudinal plan of care) . Evaluation of current treatment plan related to diabetes and patient's adherence to plan as established by provider. . Chart reviewed including relevant office notes and lab results . Discussed lab results from 3/16 that were ordered at office visit with Nephrologist on 01/14/21 o Creatinine up to 1.9 from 1.67 o Creatinine historically fluctuates . Discussed medication change o Dr [name] added 1/2 dose of Losartan at office visit on 3/2 o Patient had repeat labs on 3/16 but has not gotten the results o Also isn't sure if he's supposed to increase losartan to a whole pill. They were going to discuss that after lab results were back. . Discussed A1C goal and  recent results o 6.9% . Discussed home blood sugar readings o Checking blood sugar each morning o Typically below 150 o No readings below 100 . Reviewed and discussed medications o Injecting [place] each morning o Medications are affordable . Discussed diet o Does not follow a special diet o Does not eat fruits or vegetables daily o Mainly eats meat and bread (raises his own before) o Doesn't drink sweetened drinks o Encouraged to increase vegetable intake o Encouraged to limit simple carbs and be careful of eating too many high sugar fruits o Encouraged to increase water intake . 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  . Encouraged patient to continue daily foot checks and report any sores or calluses  . Previously consulted with [facility] Quality Dept regarding Diabetic Retinopathy Screening Report o Report was inconclusive  . Reinforced need to schedule a diabetic eye exam with eye care provider . Recommended patient call Dr Toya Smothers office for lab result interpretations and to discuss if his losartan should be increased to 1 tablet . Encouraged patient  to reach out to PCP with any blood sugar readings outside of recommended range . Encouraged patient to reach out to [facility] as needed  Patient Goals/Self-Care Activities Over the next 90 days, patient will: . check blood sugar at prescribed times . check blood sugar if I feel it is too high or too low . enter blood sugar readings and medication or insulin into daily log . take the blood sugar log to all doctor visits . take the blood sugar meter to all doctor visits  . Call PCP with any readings outside of recommended range . Eat a healthy diet. Limit sugar and simple carbohydrates.  . Fill plate halfway with vegetables . Eat lean meats . Schedule an appt with eye doctor  for diabetic eye exam . Increase water intake . Call Dr Toya Smothers office to discuss lab results and losartan directions    Care Plan : RNCM:Hypertension (Adult)  Updates made by Ilean China, RN since 02/09/2021 12:00 AM    Problem: Hypertension   Priority: Medium    Long-Range Goal: Hypertension Monitored   This Visit's Progress: Not on track  Recent Progress: Not on track  Priority: Medium  Note:   Objective: BP Readings from Last 3 Encounters:  11/28/20 140/80  08/27/20 139/64  07/04/20 (!) 158/76   Current Barriers:  . Chronic Disease Management support and education needs related to hypertension . Literacy barriers . Stress . Comorbidities: CKD and DM  Nurse Case Manager Clinical Goal(s):  . patient will work with PCP and nephrologist to address needs related to medical management of hypertension . patient will meet with RN Care Manager to address self-management of hypertension . Demonstrate ongoing health care management as evidenced by checking and recording blood pressure daily and by calling PCP or nephrologist with any readings outside of recommended range  Interventions:  . 1:1 collaboration with [name], DO regarding development and update of comprehensive plan of care as evidenced by provider attestation and co-signature . Inter-disciplinary care team collaboration (see longitudinal plan of care) . Evaluation of current treatment plan related to hypertension and patient's adherence to plan as established by provider. . Chart reviewed including relevant office notes and lab results . Discussed recent in-office blood pressure readings o Elevated at last nephrology visit on 01/14/21 . Discussed medications o No problems with medications and medications are affordable o Nephrologist added 1/2 dose of losartan o Was going to discuss increasing to full dose after lab results received. Patient hasn't received a call. . Discussed home blood pressure  readings o Checks blood pressure but inconsistently o It has been running high at home as well, around 160/85 . Encouraged patient to check and record blood pressure daily . Advised patient to bring blood pressure log to PCP and nephrology visits . Advised patient to call PCP or nephrologist with any readings outside of recommended range . 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 Reviewed upcoming appt with PCP in May 2022 . Advised patient to call Dr [name] to discuss recent lab results and losartan dose instructions . Staff message sent to Dr [name] updating him on patient's home blood pressure readings and that he has not heard from his lab results. Requested that he have someone from his office give patient a call if patient does not call in himself.  . Discussed patient  complaint of productive cough and head congestion x 3 days o Has taken nyquil o Recommended d/c nyquil o Recommended plain mucinex. Avoid D or DM. o Increase water intake o Call PCP for appt or seek medical attention for any new or worsening symptoms or symptoms that persist after 7 days . Encouraged patient to Call [facility] as needed  Patient Goals/Self-Care Activities Over the next 90 days, patient will: . check blood pressure daily . write blood pressure results in a log or diary  . Take blood pressure log to PCP appointments . Call PCP or nephrologist with any readings outside of recommended range . Increase water intake . Plain mucinex for chest congestion . Call PCP for appt or seek medical attention for any new or worsening symptoms or symptoms that persist after 7 days . Talk with nephrologist about whether he is to increase losartan dose     Follow Up Plan:  . Telephone follow up appointment with care management team member scheduled for: 04/05/21 with RN Care  Manager . The patient has been provided with contact information for the care management team and has been advised to call with any health related questions or concerns.  . Next PCP appointment scheduled for: 03/30/2021 with Dr Lajuana Ripple . Talk with Dr Toya Smothers office about lab results and losartan dosing instructions  Chong Sicilian, BSN, RN-BC [place] [facility] [place] Management Direct Dial: [phone]

## 2021-02-09 NOTE — Patient Instructions (Addendum)
Visit Information  PATIENT GOALS: Goals Addressed            This Visit's Progress   . Monitor and Manage My Blood Sugar-Diabetes Type 2       Timeframe:  Long-Range Goal Priority:  Medium Start Date:                             Expected End Date:                       Follow Up Date 04/08/21   . check blood sugar at prescribed times . check blood sugar if I feel it is too high or too low . enter blood sugar readings and medication or insulin into daily log . take the blood sugar log to all doctor visits . take the blood sugar meter to all doctor visits  . Call PCP with any readings outside of recommended range . Eat a healthy diet. Limit sugar and simple carbohydrates.  . Fill plate halfway with vegetables . Eat lean meats . Increase water intake . Call Dr Toya Smothers office to discuss lab results and losartan directions . Schedule eye exam   Why is this important?    Checking your blood sugar at home helps to keep it from getting very high or very low.   Writing the results in a diary or log helps the doctor know how to care for you.   Your blood sugar log should have the time, date and the results.   Also, write down the amount of insulin or other medicine that you take.   Other information, like what you ate, exercise done and how you were feeling, will also be helpful.     Notes:     . Track and Manage My Blood Pressure-Hypertension       Timeframe:  Long-Range Goal Priority:  Medium Start Date:                             Expected End Date:                       Follow Up Date 04/08/21   . check blood pressure daily . write blood pressure results in a log or diary  . Take blood pressure log to PCP appointments . Call PCP or nephrologist with any readings outside of recommended range . Increase water intake . Talk with nephrologist about whether he is to increase losartan dose . Plain mucinex for chest congestion . Call PCP for appt or seek medical  attention for any new or worsening symptoms or symptoms that persist after 7 days   Why is this important?    You won't feel high blood pressure, but it can still hurt your blood vessels.   High blood pressure can cause heart or kidney problems. It can also cause a stroke.   Making lifestyle changes like losing a little weight or eating less salt will help.   Checking your blood pressure at home and at different times of the day can help to control blood pressure.   If the doctor prescribes medicine remember to take it the way the doctor ordered.   Call the office if you cannot afford the medicine or if there are questions about it.     Notes:        The patient  verbalized understanding of instructions, educational materials, and care plan provided today and declined offer to receive copy of patient instructions, educational materials, and care plan.   Follow Up Plan:  . Telephone follow up appointment with care management team member scheduled for: 04/05/21 with RN Care Manager . The patient has been provided with contact information for the care management team and has been advised to call with any health related questions or concerns.  . Next PCP appointment scheduled for: 03/30/2021 with Dr Lajuana Ripple . Talk with Dr Toya Smothers office about lab results and losartan dosing instructions  Chong Sicilian, BSN, RN-BC [place] [facility] [place] Management Direct Dial: [phone]

## 2021-02-25 ENCOUNTER — Telehealth: Payer: Self-pay

## 2021-02-25 NOTE — Telephone Encounter (Signed)
Wife called - aware Diabetic neuropathy is not a DX in pts chart.

## 2021-03-10 ENCOUNTER — Ambulatory Visit (INDEPENDENT_AMBULATORY_CARE_PROVIDER_SITE_OTHER): Payer: PPO

## 2021-03-10 VITALS — Ht 72.0 in | Wt 212.0 lb

## 2021-03-10 DIAGNOSIS — Z0000 Encounter for general adult medical examination without abnormal findings: Secondary | ICD-10-CM

## 2021-03-10 NOTE — Patient Instructions (Signed)
[name] , Thank you for taking time to come for your Medicare Wellness Visit. I appreciate your ongoing commitment to your health goals. Please review the following plan we discussed and let me know if I can assist you in the future.   Screening recommendations/referrals: Colonoscopy: Return Cologuard test before it expires Recommended yearly ophthalmology/optometry visit for glaucoma screening and checkup Recommended yearly dental visit for hygiene and checkup  Vaccinations: Influenza vaccine: Declines Pneumococcal vaccine: Pneumovax-23 done 04/14/2017; Due for Prevnar-13 Tdap vaccine: DUE Shingles vaccine: Shingrix discussed. Please contact your pharmacy for coverage information.    Covid-19: Declines  Advanced directives: Advance directive discussed with you today. I have provided a copy for you to complete at home and have notarized. Once this is complete please bring a copy in to our office so we can scan it into your chart.  Conditions/risks identified: Aim for 30 minutes of exercise or brisk walking each day, drink 6-8 glasses of water and eat lots of fruits and vegetables.  Next appointment: Follow up in one year for your annual wellness visit.   Preventive Care 67 Years and Older, Male  Preventive care refers to lifestyle choices and visits with your health care provider that can promote health and wellness. What does preventive care include?  A yearly physical exam. This is also called an annual well check.  Dental exams once or twice a year.  Routine eye exams. Ask your health care provider how often you should have your eyes checked.  Personal lifestyle choices, including:  Daily care of your teeth and gums.  Regular physical activity.  Eating a healthy diet.  Avoiding tobacco and drug use.  Limiting alcohol use.  Practicing safe sex.  Taking low doses of aspirin every day.  Taking vitamin and mineral supplements as recommended by your health care  provider. What happens during an annual well check? The services and screenings done by your health care provider during your annual well check will depend on your age, overall health, lifestyle risk factors, and family history of disease. Counseling  Your health care provider may ask you questions about your:  Alcohol use.  Tobacco use.  Drug use.  Emotional well-being.  Home and relationship well-being.  Sexual activity.  Eating habits.  History of falls.  Memory and ability to understand (cognition).  Work and work Statistician. Screening  You may have the following tests or measurements:  Height, weight, and BMI.  Blood pressure.  Lipid and cholesterol levels. These may be checked every 5 years, or more frequently if you are over 9 years old.  Skin check.  Lung cancer screening. You may have this screening every year starting at age 69 if you have a 30-pack-year history of smoking and currently smoke or have quit within the past 15 years.  Fecal occult blood test (FOBT) of the stool. You may have this test every year starting at age 27.  Flexible sigmoidoscopy or colonoscopy. You may have a sigmoidoscopy every 5 years or a colonoscopy every 10 years starting at age 24.  Prostate cancer screening. Recommendations will vary depending on your family history and other risks.  Hepatitis C blood test.  Hepatitis B blood test.  Sexually transmitted disease (STD) testing.  Diabetes screening. This is done by checking your blood sugar (glucose) after you have not eaten for a while (fasting). You may have this done every 1-3 years.  Abdominal aortic aneurysm (AAA) screening. You may need this if you are a current or former  smoker.  Osteoporosis. You may be screened starting at age 26 if you are at high risk. Talk with your health care provider about your test results, treatment options, and if necessary, the need for more tests. Vaccines  Your health care provider  may recommend certain vaccines, such as:  Influenza vaccine. This is recommended every year.  Tetanus, diphtheria, and acellular pertussis (Tdap, Td) vaccine. You may need a Td booster every 10 years.  Zoster vaccine. You may need this after age 40.  Pneumococcal 13-valent conjugate (PCV13) vaccine. One dose is recommended after age 23.  Pneumococcal polysaccharide (PPSV23) vaccine. One dose is recommended after age 85. Talk to your health care provider about which screenings and vaccines you need and how often you need them. This information is not intended to replace advice given to you by your health care provider. Make sure you discuss any questions you have with your health care provider. Document Released: 11/28/2015 Document Revised: 07/21/2016 Document Reviewed: 09/02/2015 Elsevier Interactive Patient Education  2017 [employer] Prevention in the Home Falls can cause injuries. They can happen to people of all ages. There are many things you can do to make your home safe and to help prevent falls. What can I do on the outside of my home?  Regularly fix the edges of walkways and driveways and fix any cracks.  Remove anything that might make you trip as you walk through a door, such as a raised step or threshold.  Trim any bushes or trees on the path to your home.  Use bright outdoor lighting.  Clear any walking paths of anything that might make someone trip, such as rocks or tools.  Regularly check to see if handrails are loose or broken. Make sure that both sides of any steps have handrails.  Any raised decks and porches should have guardrails on the edges.  Have any leaves, snow, or ice cleared regularly.  Use sand or salt on walking paths during winter.  Clean up any spills in your garage right away. This includes oil or grease spills. What can I do in the bathroom?  Use night lights.  Install grab bars by the toilet and in the tub and shower. Do not use  towel bars as grab bars.  Use non-skid mats or decals in the tub or shower.  If you need to sit down in the shower, use a plastic, non-slip stool.  Keep the floor dry. Clean up any water that spills on the floor as soon as it happens.  Remove soap buildup in the tub or shower regularly.  Attach bath mats securely with double-sided non-slip rug tape.  Do not have throw rugs and other things on the floor that can make you trip. What can I do in the bedroom?  Use night lights.  Make sure that you have a light by your bed that is easy to reach.  Do not use any sheets or blankets that are too big for your bed. They should not hang down onto the floor.  Have a firm chair that has side arms. You can use this for support while you get dressed.  Do not have throw rugs and other things on the floor that can make you trip. What can I do in the kitchen?  Clean up any spills right away.  Avoid walking on wet floors.  Keep items that you use a lot in easy-to-reach places.  If you need to reach something above you, use a  strong step stool that has a grab bar.  Keep electrical cords out of the way.  Do not use floor polish or wax that makes floors slippery. If you must use wax, use non-skid floor wax.  Do not have throw rugs and other things on the floor that can make you trip. What can I do with my stairs?  Do not leave any items on the stairs.  Make sure that there are handrails on both sides of the stairs and use them. Fix handrails that are broken or loose. Make sure that handrails are as long as the stairways.  Check any carpeting to make sure that it is firmly attached to the stairs. Fix any carpet that is loose or worn.  Avoid having throw rugs at the top or bottom of the stairs. If you do have throw rugs, attach them to the floor with carpet tape.  Make sure that you have a light switch at the top of the stairs and the bottom of the stairs. If you do not have them, ask  someone to add them for you. What else can I do to help prevent falls?  Wear shoes that:  Do not have high heels.  Have rubber bottoms.  Are comfortable and fit you well.  Are closed at the toe. Do not wear sandals.  If you use a stepladder:  Make sure that it is fully opened. Do not climb a closed stepladder.  Make sure that both sides of the stepladder are locked into place.  Ask someone to hold it for you, if possible.  Clearly mark and make sure that you can see:  Any grab bars or handrails.  First and last steps.  Where the edge of each step is.  Use tools that help you move around (mobility aids) if they are needed. These include:  Canes.  Walkers.  Scooters.  Crutches.  Turn on the lights when you go into a dark area. Replace any light bulbs as soon as they burn out.  Set up your furniture so you have a clear path. Avoid moving your furniture around.  If any of your floors are uneven, fix them.  If there are any pets around you, be aware of where they are.  Review your medicines with your doctor. Some medicines can make you feel dizzy. This can increase your chance of falling. Ask your doctor what other things that you can do to help prevent falls. This information is not intended to replace advice given to you by your health care provider. Make sure you discuss any questions you have with your health care provider. Document Released: 08/28/2009 Document Revised: 04/08/2016 Document Reviewed: 12/06/2014 Elsevier Interactive Patient Education  2017 [employer].

## 2021-03-10 NOTE — Progress Notes (Signed)
Subjective:   [name] is a 65 y.o. male who presents for Medicare Annual/Subsequent preventive examination.  Virtual Visit via Telephone Note  I connected with  [name] on 03/10/21 at 10:30 AM EDT by telephone and verified that I am speaking with the correct person using two identifiers.  Location: Patient: Home Provider: [facility] Persons participating in the virtual visit: patient/Nurse Health Advisor   I discussed the limitations, risks, security and privacy concerns of performing an evaluation and management service by telephone and the availability of in person appointments. The patient expressed understanding and agreed to proceed.  Interactive audio and video telecommunications were attempted between this nurse and patient, however failed, due to patient having technical difficulties OR patient did not have access to video capability.  We continued and completed visit with audio only.  Some vital signs may be absent or patient reported.   Almalik Weissberg E Simcha Farrington, LPN   Review of Systems     Cardiac Risk Factors include: advanced age (>20men, >28 women);diabetes mellitus;hypertension;dyslipidemia;male gender     Objective:    Today's Vitals   03/10/21 1022  Weight: 212 lb (96.2 kg)  Height: 6' (1.829 m)   Body mass index is 28.75 kg/m.  Advanced Directives 03/10/2021 05/15/2020 05/15/2020 05/07/2020 03/07/2020 08/28/2018 06/28/2018  Does Patient Have a Medical Advance Directive? No No No No No No No  Would patient like information on creating a medical advance directive? Yes (MAU/Ambulatory/Procedural Areas - Information given) No - Patient declined No - Patient declined No - Patient declined No - Patient declined - No - Patient declined    Current Medications (verified) Outpatient Encounter Medications as of 03/10/2021  Medication Sig  . amLODipine (NORVASC) 10 MG tablet Take 1 tablet (10 mg total) by mouth daily.  . cholecalciferol (VITAMIN D3) 25 MCG (1000  UNIT) tablet Take 2,000 Units by mouth daily.  . cloNIDine (CATAPRES) 0.3 MG tablet Take 1 tablet (0.3 mg total) by mouth 2 (two) times daily.  Marland Kitchen gabapentin (NEURONTIN) 100 MG capsule Take 1 capsule (100 mg total) by mouth 2 (two) times daily.  . insulin degludec (TRESIBA FLEXTOUCH) 100 UNIT/ML FlexTouch Pen Inject 30-36 Units into the skin every morning. Increase by one unit every other day until fasting sugar is below 150  . Insulin Pen Needle (PEN NEEDLES) 31G X 5 MM MISC Use daily with insulin Dx E11.9  . loratadine (CLARITIN) 10 MG tablet Take 1 tablet (10 mg total) by mouth every other day. (renally dosed)  . losartan (COZAAR) 25 MG tablet Take 12.5 mg by mouth daily.  . tamsulosin (FLOMAX) 0.4 MG CAPS capsule Take 1 capsule (0.4 mg total) by mouth daily after supper.  . vitamin B-12 (CYANOCOBALAMIN) 500 MCG tablet Take 500 mcg by mouth daily.  . ferrous sulfate 325 (65 FE) MG tablet Take 325 mg by mouth daily with breakfast. (Patient not taking: Reported on 03/10/2021)  . gabapentin (NEURONTIN) 100 MG capsule TAKE (1) CAPSULE THREE TIMES DAILY. (Patient not taking: Reported on 03/10/2021)  . sildenafil (VIAGRA) 25 MG tablet Take 1-2 tablets (25-50 mg total) by mouth daily as needed for erectile dysfunction. (Patient not taking: Reported on 03/10/2021)   No facility-administered encounter medications on file as of 03/10/2021.    Allergies (verified) Patient has no known allergies.   History: Past Medical History:  Diagnosis Date  . Chronic kidney disease   . Complication of anesthesia    Hard to wake  . Degenerative joint disease (DJD) of lumbar  spine   . Diabetes mellitus without complication (HCC)    Type II  . Essential hypertension 02/23/2017  . Hypertension   . Lumbar stenosis   . Proteinuria   . Radicular leg pain    Past Surgical History:  Procedure Laterality Date  . AMPUTATION TOE Right 05/17/2020   Procedure: AMPUTATION TOE partial right great toe;  Surgeon: Evelina Bucy, DPM;  Location: [facility];  Service: Podiatry;  Laterality: Right;  . BELPHAROPTOSIS REPAIR    . CARDIAC CATHETERIZATION    . EYE SURGERY    . HEMORRHOID SURGERY    . skin cancer removed right ear    . TRANSFORAMINAL LUMBAR INTERBODY FUSION (TLIF) WITH PEDICLE SCREW FIXATION 2 LEVEL N/A 07/06/2018   Procedure: TRANSFORAMINAL LUMBAR INTERBODY FUSION (TLIF) L4-S1;  Surgeon: Melina Schools, MD;  Location: [facility];  Service: Orthopedics;  Laterality: N/A;   Family History  Problem Relation Age of Onset  . Heart attack Father   . Hypertension Father   . Vision loss Sister        one eye  . Cancer Brother        skin  . Diabetes Brother    Social History   Socioeconomic History  . Marital status: Married    Spouse name: Not on file  . Number of children: 3  . Years of education: Not on file  . Highest education level: Not on file  Occupational History  . Not on file  Tobacco Use  . Smoking status: Never Smoker  . Smokeless tobacco: Never Used  Vaping Use  . Vaping Use: Never used  Substance and Sexual Activity  . Alcohol use: Yes    Alcohol/week: 6.0 standard drinks    Types: 6 Cans of beer per week  . Drug use: No  . Sexual activity: Yes  Other Topics Concern  . Not on file  Social History Narrative   Lives with his wife - Has a son with cirrhosis who he stays with a lot. Has another son and one daughter    Social Determinants of Radio broadcast assistant Strain: Low Risk   . Difficulty of Paying Living Expenses: Not hard at all  Food Insecurity: No Food Insecurity  . Worried About Charity fundraiser in the Last Year: Never true  . Ran Out of Food in the Last Year: Never true  Transportation Needs: No Transportation Needs  . Lack of Transportation (Medical): No  . Lack of Transportation (Non-Medical): No  Physical Activity: Sufficiently Active  . Days of Exercise per Week: 7 days  . Minutes of Exercise per Session: 60 min  Stress: No Stress Concern Present   . Feeling of Stress : Not at all  Social Connections: Moderately Isolated  . Frequency of Communication with Friends and Family: More than three times a week  . Frequency of Social Gatherings with Friends and Family: More than three times a week  . Attends Religious Services: Never  . Active Member of Clubs or Organizations: No  . Attends Archivist Meetings: Never  . Marital Status: Married    Tobacco Counseling Counseling given: Not Answered   Clinical Intake:  Pre-visit preparation completed: Yes  Pain : No/denies pain     BMI - recorded: 28.75 Nutritional Status: BMI 25 -29 Overweight Nutritional Risks: None Diabetes: Yes CBG done?: No Did pt. bring in CBG monitor from home?: No  How often do you need to have someone help you when you  read instructions, pamphlets, or other written materials from your doctor or pharmacy?: 1 - Never  Nutrition Risk Assessment:  Has the patient had any N/V/D within the last 2 months?  No  Does the patient have any non-healing wounds?  No  Has the patient had any unintentional weight loss or weight gain?  No   Diabetes:  Is the patient diabetic?  Yes  If diabetic, was a CBG obtained today?  No  Did the patient bring in their glucometer from home?  No  How often do you monitor your CBG's? Once daily fasting - This am 186 per patient.   Financial Strains and Diabetes Management:  Are you having any financial strains with the device, your supplies or your medication? No .  Does the patient want to be seen by Chronic Care Management for management of their diabetes?  No  Would the patient like to be referred to a Nutritionist or for Diabetic Management?  No   Diabetic Exams:  Diabetic Eye Exam: Completed 10/15/2020 by [facility] - He is going to try to make appt with Dr Marin Comment soon  Diabetic Foot Exam: Completed 04/15/2020. Pt has been advised about the importance in completing this exam. Pt is scheduled for diabetic foot exam on  04/2021.    Interpreter Needed?: No  Information entered by :: Elya Diloreto, LPN   Activities of Daily Living In your present state of health, do you have any difficulty performing the following activities: 03/10/2021 12/01/2020  Hearing? N N  Vision? N N  Difficulty concentrating or making decisions? N N  Walking or climbing stairs? N N  Dressing or bathing? N N  Doing errands, shopping? N N  Preparing Food and eating ? N N  Using the Toilet? N N  In the past six months, have you accidently leaked urine? N N  Do you have problems with loss of bowel control? N N  Managing your Medications? N N  Managing your Finances? N N  Housekeeping or managing your Housekeeping? N N  Some recent data might be hidden    Patient Care Team: [name], DO as PCP - General (Family Medicine) Allyn Kenner, MD as Consulting Physician (Dermatology) Liana Gerold, MD as Consulting Physician (Nephrology) Ilean China, RN as Case Manager  Indicate any recent Medical Services you may have received from other than [facility] providers in the past year (date may be approximate).     Assessment:   This is a routine wellness examination for [name].  Hearing/Vision screen  Hearing Screening   125Hz  250Hz  500Hz  1000Hz  2000Hz  3000Hz  4000Hz  6000Hz  8000Hz   Right ear:           Left ear:           Comments: Declines hearing difficulties  Vision Screening Comments: Wears bifocals - Hasn't had eye doctor in years - Had exam with [facility] 10/2020 - positive retinopathy - He is going to try to get appt with [facility] soon.  Dietary issues and exercise activities discussed: Current Exercise Habits: Home exercise routine, Type of exercise: walking, Time (Minutes): 60, Frequency (Times/Week): 7, Weekly Exercise (Minutes/Week): 420, Intensity: Mild, Exercise limited by: orthopedic condition(s)  Goals    . Client will verbalize knowledge of diabetes self-management as evidenced by Hgb A1C <7 or as defined by  provider.     Diabetes self management actions:  Glucose monitoring per provider recommendations  Perform Quality checks on blood meter  Eat Healthy  Check feet daily  Visit provider every 3-6 months as directed  Hbg A1C level every 3-6 months.  Eye Exam yearly    . DIET - EAT MORE FRUITS AND VEGETABLES     Limit sweets and carbs Diabetes Mellitus and Nutrition, Adult When you have diabetes, or diabetes mellitus, it is very important to have healthy eating habits because your blood sugar (glucose) levels are greatly affected by what you eat and drink. Eating healthy foods in the right amounts, at about the same times every day, can help you:  Control your blood glucose.  Lower your risk of heart disease.  Improve your blood pressure.  Reach or maintain a healthy weight. What can affect my meal plan? Every person with diabetes is different, and each person has different needs for a meal plan. Your health care provider may recommend that you work with a dietitian to make a meal plan that is best for you. Your meal plan may vary depending on factors such as:  The calories you need.  The medicines you take.  Your weight.  Your blood glucose, blood pressure, and cholesterol levels.  Your activity level.  Other health conditions you have, such as heart or kidney disease. How do carbohydrates affect me? Carbohydrates, also called carbs, affect your blood glucose level more than any other type of food. Eating carbs naturally raises the amount of glucose in your blood. Carb counting is a method for keeping track of how many carbs you eat. Counting carbs is important to keep your blood glucose at a healthy level, especially if you use insulin or take certain oral diabetes medicines. It is important to know how many carbs you can safely have in each meal. This is different for every person. Your dietitian can help you calculate how many carbs you should have at each meal and for  each snack. How does alcohol affect me? Alcohol can cause a sudden decrease in blood glucose (hypoglycemia), especially if you use insulin or take certain oral diabetes medicines. Hypoglycemia can be a life-threatening condition. Symptoms of hypoglycemia, such as sleepiness, dizziness, and confusion, are similar to symptoms of having too much alcohol. 1. Do not drink alcohol if: 1. Your health care provider tells you not to drink. 2. You are pregnant, may be pregnant, or are planning to become pregnant. 2. If you drink alcohol: 1. Do not drink on an empty stomach. 2. Limit how much you use to:  0-1 drink a day for women.  0-2 drinks a day for men. 3. Be aware of how much alcohol is in your drink. In the [place] one drink equals one 12 oz bottle of beer (355 mL), one 5 oz glass of wine (148 mL), or one 1 oz glass of hard liquor (44 mL). 4. Keep yourself hydrated with water, diet soda, or unsweetened iced tea.  Keep in mind that regular soda, juice, and other mixers may contain a lot of sugar and must be counted as carbs. What are tips for following this plan? Reading food labels  Start by checking the serving size on the "Nutrition Facts" label of packaged foods and drinks. The amount of calories, carbs, fats, and other nutrients listed on the label is based on one serving of the item. Many items contain more than one serving per package.  Check the total grams (g) of carbs in one serving. You can calculate the number of servings of carbs in one serving by dividing the total carbs by 15. For example, if a food  has 30 g of total carbs per serving, it would be equal to 2 servings of carbs.  Check the number of grams (g) of saturated fats and trans fats in one serving. Choose foods that have a low amount or none of these fats.  Check the number of milligrams (mg) of salt (sodium) in one serving. Most people should limit total sodium intake to less than 2,300 mg per day.  Always check the  nutrition information of foods labeled as "low-fat" or "nonfat." These foods may be higher in added sugar or refined carbs and should be avoided.  Talk to your dietitian to identify your daily goals for nutrients listed on the label. Shopping  Avoid buying canned, pre-made, or processed foods. These foods tend to be high in fat, sodium, and added sugar.  Shop around the outside edge of the grocery store. This is where you will most often find fresh fruits and vegetables, bulk grains, fresh meats, and fresh dairy. Cooking  Use low-heat cooking methods, such as baking, instead of high-heat cooking methods like deep frying.  Cook using healthy oils, such as olive, canola, or sunflower oil.  Avoid cooking with butter, cream, or high-fat meats. Meal planning 1. Eat meals and snacks regularly, preferably at the same times every day. Avoid going long periods of time without eating. 2. Eat foods that are high in fiber, such as fresh fruits, vegetables, beans, and whole grains. Talk with your dietitian about how many servings of carbs you can eat at each meal. 3. Eat 4-6 oz (112-168 g) of lean protein each day, such as lean meat, chicken, fish, eggs, or tofu. One ounce (oz) of lean protein is equal to: ? 1 oz (28 g) of meat, chicken, or fish. ? 1 egg. ?  cup (62 g) of tofu. 4. Eat some foods each day that contain healthy fats, such as avocado, nuts, seeds, and fish.   What foods should I eat? Fruits Berries. Apples. Oranges. Peaches. Apricots. Plums. Grapes. Mango. Papaya. Pomegranate. Kiwi. Cherries. Vegetables Lettuce. Spinach. Leafy greens, including kale, chard, collard greens, and mustard greens. Beets. Cauliflower. Cabbage. Broccoli. Carrots. Green beans. Tomatoes. Peppers. Onions. Cucumbers. Brussels sprouts. Grains Whole grains, such as whole-wheat or whole-grain bread, crackers, tortillas, cereal, and pasta. Unsweetened oatmeal. Quinoa. Brown or wild rice. Meats and other  proteins Seafood. Poultry without skin. Lean cuts of poultry and beef. Tofu. Nuts. Seeds. Dairy Low-fat or fat-free dairy products such as milk, yogurt, and cheese. The items listed above may not be a complete list of foods and beverages you can eat. Contact a dietitian for more information. What foods should I avoid? Fruits Fruits canned with syrup. Vegetables Canned vegetables. Frozen vegetables with butter or cream sauce. Grains Refined white flour and flour products such as bread, pasta, snack foods, and cereals. Avoid all processed foods. Meats and other proteins Fatty cuts of meat. Poultry with skin. Breaded or fried meats. Processed meat. Avoid saturated fats. Dairy Full-fat yogurt, cheese, or milk. Beverages Sweetened drinks, such as soda or iced tea. The items listed above may not be a complete list of foods and beverages you should avoid. Contact a dietitian for more information. Questions to ask a health care provider  Do I need to meet with a diabetes educator?  Do I need to meet with a dietitian?  What number can I call if I have questions?  When are the best times to check my blood glucose? Where to find more information:  American Diabetes Association:  diabetes.org  Academy of Nutrition and Dietetics: www.eatright.[employer] of Diabetes and Digestive and Kidney Diseases: [employer]  Association of Diabetes Care and Education Specialists: www.diabeteseducator.org Summary  It is important to have healthy eating habits because your blood sugar (glucose) levels are greatly affected by what you eat and drink.  A healthy meal plan will help you control your blood glucose and maintain a healthy lifestyle.  Your health care provider may recommend that you work with a dietitian to make a meal plan that is best for you.  Keep in mind that carbohydrates (carbs) and alcohol have immediate effects on your blood glucose levels. It is important to  count carbs and to use alcohol carefully. This information is not intended to replace advice given to you by your health care provider. Make sure you discuss any questions you have with your health care provider. Document Revised: 10/09/2019 Document Reviewed: 10/09/2019 Elsevier Patient Education  2021 [employer].     . Exercise 150 min/wk Moderate Activity    . Have 3 meals a day    . Monitor and Manage My Blood Sugar-Diabetes Type 2     Timeframe:  Long-Range Goal Priority:  Medium Start Date:                             Expected End Date:                       Follow Up Date 04/08/21   . check blood sugar at prescribed times . check blood sugar if I feel it is too high or too low . enter blood sugar readings and medication or insulin into daily log . take the blood sugar log to all doctor visits . take the blood sugar meter to all doctor visits  . Call PCP with any readings outside of recommended range . Eat a healthy diet. Limit sugar and simple carbohydrates.  . Fill plate halfway with vegetables . Eat lean meats . Increase water intake . Call Dr Toya Smothers office to discuss lab results and losartan directions . Schedule eye exam   Why is this important?    Checking your blood sugar at home helps to keep it from getting very high or very low.   Writing the results in a diary or log helps the doctor know how to care for you.   Your blood sugar log should have the time, date and the results.   Also, write down the amount of insulin or other medicine that you take.   Other information, like what you ate, exercise done and how you were feeling, will also be helpful.     Notes:     . Track and Manage My Blood Pressure-Hypertension     Timeframe:  Long-Range Goal Priority:  Medium Start Date:                             Expected End Date:                       Follow Up Date 04/08/21   . check blood pressure daily . write blood pressure results in a log or diary   . Take blood pressure log to PCP appointments . Call PCP or nephrologist with any readings outside of recommended range . Increase water intake . Talk with nephrologist about whether  he is to increase losartan dose . Plain mucinex for chest congestion . Call PCP for appt or seek medical attention for any new or worsening symptoms or symptoms that persist after 7 days   Why is this important?    You won't feel high blood pressure, but it can still hurt your blood vessels.   High blood pressure can cause heart or kidney problems. It can also cause a stroke.   Making lifestyle changes like losing a little weight or eating less salt will help.   Checking your blood pressure at home and at different times of the day can help to control blood pressure.   If the doctor prescribes medicine remember to take it the way the doctor ordered.   Call the office if you cannot afford the medicine or if there are questions about it.     Notes:       Depression Screen PHQ 2/9 Scores 03/10/2021 11/28/2020 08/27/2020 07/04/2020 06/18/2020 05/14/2020 04/15/2020  PHQ - 2 Score 0 0 0 0 0 0 0  PHQ- 9 Score - 0 - 0 - 0 0    Fall Risk Fall Risk  03/10/2021 12/01/2020 11/28/2020 08/27/2020 07/04/2020  Falls in the past year? 0 0 0 0 0  Number falls in past yr: 0 - - - -  Injury with Fall? 0 - - - -  Comment - - - - -  Risk for fall due to : Orthopedic patient;Impaired balance/gait - - - -  Follow up Education provided;Falls prevention discussed - - - -    FALL RISK PREVENTION PERTAINING TO THE HOME:  Any stairs in or around the home? Yes  If so, are there any without handrails? No  Home free of loose throw rugs in walkways, pet beds, electrical cords, etc? Yes  Adequate lighting in your home to reduce risk of falls? Yes   ASSISTIVE DEVICES UTILIZED TO PREVENT FALLS:  Life alert? No  Use of a cane, walker or w/c? No  Grab bars in the bathroom? No  Shower chair or bench in shower? No  Elevated toilet  seat or a handicapped toilet? No   TIMED UP AND GO:  Was the test performed? No .  Telephonic visit  Cognitive Function: Normal cognitive status assessed by direct observation by this Nurse Health Advisor. No abnormalities found.   MMSE - Mini Mental State Exam 06/13/2018 04/14/2017  Not completed: - Unable to complete  Orientation to time 5 5  Orientation to Place 5 5  Registration 3 3  Attention/ Calculation 5 0  Recall 2 3  Language- name 2 objects 2 2  Language- repeat 1 1  Language- follow 3 step command 3 3  Language- read & follow direction 1 0  Write a sentence 0 0  Copy design 1 0  Total score 28 22     6CIT Screen 03/07/2020  What Year? 0 points  What month? 0 points  What time? 0 points  Count back from 20 0 points  Months in reverse 2 points  Repeat phrase 4 points    Immunizations Immunization History  Administered Date(s) Administered  . Influenza,inj,Quad PF,6+ Mos 08/25/2017, 09/01/2018  . Influenza,inj,quad, With Preservative 09/15/2017, 09/11/2018  . Pneumococcal Polysaccharide-23 04/14/2017    TDAP status: Due, Education has been provided regarding the importance of this vaccine. Advised may receive this vaccine at local pharmacy or Health Dept. Aware to provide a copy of the vaccination record if obtained from local pharmacy or Health  Dept. Verbalized acceptance and understanding.  Flu Vaccine status: Declined, Education has been provided regarding the importance of this vaccine but patient still declined. Advised may receive this vaccine at local pharmacy or Health Dept. Aware to provide a copy of the vaccination record if obtained from local pharmacy or Health Dept. Verbalized acceptance and understanding.  Pneumococcal vaccine status: Due, Education has been provided regarding the importance of this vaccine. Advised may receive this vaccine at local pharmacy or Health Dept. Aware to provide a copy of the vaccination record if obtained from local  pharmacy or Health Dept. Verbalized acceptance and understanding.  Covid-19 vaccine status: Declined, Education has been provided regarding the importance of this vaccine but patient still declined. Advised may receive this vaccine at local pharmacy or Health Dept.or vaccine clinic. Aware to provide a copy of the vaccination record if obtained from local pharmacy or Health Dept. Verbalized acceptance and understanding.  Qualifies for Shingles Vaccine? Yes   Zostavax completed No   Shingrix Completed?: No.    Education has been provided regarding the importance of this vaccine. Patient has been advised to call insurance company to determine out of pocket expense if they have not yet received this vaccine. Advised may also receive vaccine at local pharmacy or Health Dept. Verbalized acceptance and understanding.  Screening Tests Health Maintenance  Topic Date Due  . Fecal DNA (Cologuard)  Never done  . TETANUS/TDAP  08/27/2021 (Originally 01/11/1975)  . PNA vac Low Risk Adult (1 of 2 - PCV13) 08/27/2021 (Originally 01/11/2019)  . FOOT EXAM  04/15/2021  . HEMOGLOBIN A1C  05/28/2021  . INFLUENZA VACCINE  06/15/2021  . OPHTHALMOLOGY EXAM  10/15/2021  . Hepatitis C Screening  Completed  . HPV VACCINES  Aged Out  . COVID-19 Vaccine  Discontinued    Health Maintenance  Health Maintenance Due  Topic Date Due  . Fecal DNA (Cologuard)  Never done    Colorectal Cancer Screening: He has cologuard test at home - advised to return before 90 day expiration.  Lung Cancer Screening: (Low Dose CT Chest recommended if Age 21-80 years, 30 pack-year currently smoking OR have quit w/in 15years.) does not qualify.   Additional Screening:  Hepatitis C Screening: does qualify; Completed 04/01/2020  Vision Screening: Recommended annual ophthalmology exams for early detection of glaucoma and other disorders of the eye. Is the patient up to date with their annual eye exam?  Yes  Who is the provider or what  is the name of the office in which the patient attends annual eye exams? No eye doctor at this time - last eye exam with [facility] at our office If pt is not established with a provider, would they like to be referred to a provider to establish care? No .   Dental Screening: Recommended annual dental exams for proper oral hygiene  Community Resource Referral / Chronic Care Management: CRR required this visit?  No   CCM required this visit?  No      Plan:     I have personally reviewed and noted the following in the patient's chart:   . Medical and social history . Use of alcohol, tobacco or illicit drugs  . Current medications and supplements . Functional ability and status . Nutritional status . Physical activity . Advanced directives . List of other physicians . Hospitalizations, surgeries, and ER visits in previous 12 months . Vitals . Screenings to include cognitive, depression, and falls . Referrals and appointments  In addition, I have reviewed  and discussed with patient certain preventive protocols, quality metrics, and best practice recommendations. A written personalized care plan for preventive services as well as general preventive health recommendations were provided to patient.     Sandrea Hammond, LPN   [id number]   Nurse Notes: None

## 2021-03-13 ENCOUNTER — Other Ambulatory Visit: Payer: PPO

## 2021-03-13 ENCOUNTER — Other Ambulatory Visit: Payer: Self-pay

## 2021-03-13 DIAGNOSIS — N189 Chronic kidney disease, unspecified: Secondary | ICD-10-CM | POA: Diagnosis not present

## 2021-03-13 DIAGNOSIS — E1129 Type 2 diabetes mellitus with other diabetic kidney complication: Secondary | ICD-10-CM | POA: Diagnosis not present

## 2021-03-13 DIAGNOSIS — I129 Hypertensive chronic kidney disease with stage 1 through stage 4 chronic kidney disease, or unspecified chronic kidney disease: Secondary | ICD-10-CM | POA: Diagnosis not present

## 2021-03-13 DIAGNOSIS — R808 Other proteinuria: Secondary | ICD-10-CM | POA: Diagnosis not present

## 2021-03-13 DIAGNOSIS — R809 Proteinuria, unspecified: Secondary | ICD-10-CM | POA: Diagnosis not present

## 2021-03-13 DIAGNOSIS — D631 Anemia in chronic kidney disease: Secondary | ICD-10-CM | POA: Diagnosis not present

## 2021-03-13 DIAGNOSIS — E1122 Type 2 diabetes mellitus with diabetic chronic kidney disease: Secondary | ICD-10-CM | POA: Diagnosis not present

## 2021-03-19 DIAGNOSIS — R809 Proteinuria, unspecified: Secondary | ICD-10-CM | POA: Diagnosis not present

## 2021-03-19 DIAGNOSIS — E1129 Type 2 diabetes mellitus with other diabetic kidney complication: Secondary | ICD-10-CM | POA: Diagnosis not present

## 2021-03-19 DIAGNOSIS — E1122 Type 2 diabetes mellitus with diabetic chronic kidney disease: Secondary | ICD-10-CM | POA: Diagnosis not present

## 2021-03-19 DIAGNOSIS — I129 Hypertensive chronic kidney disease with stage 1 through stage 4 chronic kidney disease, or unspecified chronic kidney disease: Secondary | ICD-10-CM | POA: Diagnosis not present

## 2021-03-19 DIAGNOSIS — D631 Anemia in chronic kidney disease: Secondary | ICD-10-CM | POA: Diagnosis not present

## 2021-03-19 DIAGNOSIS — R808 Other proteinuria: Secondary | ICD-10-CM | POA: Diagnosis not present

## 2021-03-19 DIAGNOSIS — N189 Chronic kidney disease, unspecified: Secondary | ICD-10-CM | POA: Diagnosis not present

## 2021-03-24 ENCOUNTER — Other Ambulatory Visit: Payer: Self-pay | Admitting: Podiatry

## 2021-03-24 NOTE — Telephone Encounter (Signed)
Please advise 

## 2021-03-30 ENCOUNTER — Ambulatory Visit (INDEPENDENT_AMBULATORY_CARE_PROVIDER_SITE_OTHER): Payer: PPO | Admitting: Family Medicine

## 2021-03-30 ENCOUNTER — Other Ambulatory Visit: Payer: Self-pay

## 2021-03-30 ENCOUNTER — Encounter: Payer: Self-pay | Admitting: Family Medicine

## 2021-03-30 VITALS — BP 160/70 | HR 65 | Temp 97.8°F | Ht 72.0 in | Wt 202.4 lb

## 2021-03-30 DIAGNOSIS — L570 Actinic keratosis: Secondary | ICD-10-CM

## 2021-03-30 DIAGNOSIS — Z8249 Family history of ischemic heart disease and other diseases of the circulatory system: Secondary | ICD-10-CM | POA: Diagnosis not present

## 2021-03-30 DIAGNOSIS — E785 Hyperlipidemia, unspecified: Secondary | ICD-10-CM

## 2021-03-30 DIAGNOSIS — N1832 Chronic kidney disease, stage 3b: Secondary | ICD-10-CM

## 2021-03-30 DIAGNOSIS — R0789 Other chest pain: Secondary | ICD-10-CM

## 2021-03-30 DIAGNOSIS — E1159 Type 2 diabetes mellitus with other circulatory complications: Secondary | ICD-10-CM

## 2021-03-30 DIAGNOSIS — E1122 Type 2 diabetes mellitus with diabetic chronic kidney disease: Secondary | ICD-10-CM | POA: Diagnosis not present

## 2021-03-30 DIAGNOSIS — N182 Chronic kidney disease, stage 2 (mild): Secondary | ICD-10-CM | POA: Diagnosis not present

## 2021-03-30 DIAGNOSIS — I152 Hypertension secondary to endocrine disorders: Secondary | ICD-10-CM | POA: Diagnosis not present

## 2021-03-30 DIAGNOSIS — Z8631 Personal history of diabetic foot ulcer: Secondary | ICD-10-CM | POA: Diagnosis not present

## 2021-03-30 DIAGNOSIS — Z89419 Acquired absence of unspecified great toe: Secondary | ICD-10-CM

## 2021-03-30 DIAGNOSIS — E1169 Type 2 diabetes mellitus with other specified complication: Secondary | ICD-10-CM

## 2021-03-30 LAB — BAYER DCA HB A1C WAIVED: HB A1C (BAYER DCA - WAIVED): 7.1 % — ABNORMAL HIGH (ref <7.0)

## 2021-03-30 MED ORDER — GABAPENTIN 100 MG PO CAPS: 100.0000 mg | ORAL_CAPSULE | Freq: Two times a day (BID) | ORAL | 2 refills | Status: DC

## 2021-03-30 NOTE — Patient Instructions (Signed)
Cryoablation, Care After This sheet gives you information about how to care for yourself after your procedure. Your health care provider may also give you more specific instructions. If you have problems or questions, contact your health care provider. What can I expect after the procedure? After the procedure, it is common to have:  Soreness around the treatment area.  Mild pain and swelling in the treatment area. Follow these instructions at home: Treatment area care  If you have an incision, follow instructions from your health care provider about how to take care of it. Make sure you: ? Wash your hands with soap and water for at least 20 seconds before and after you change your bandage (dressing). If soap and water are not available, use hand sanitizer. ? Change your dressing as told by your health care provider. ? Leave stitches (sutures), skin glue, or adhesive strips in place. These skin closures may need to stay in place for 2 weeks or longer. If adhesive strip edges start to loosen and curl up, you may trim the loose edges. Do not remove adhesive strips completely unless your health care provider tells you to do that.  Check your treatment area every day for signs of infection. Check for: ? More redness, swelling, or pain. ? Fluid or blood. ? Warmth. ? Pus or a bad smell.  Keep the treated area clean, dry, and covered with a dressing until it has healed. Clean the area with soap and water or as told by your health care provider. If your bandage gets wet, change it right away.  Do not take baths, swim, or use a hot tub until your health care provider approves. Ask your health care provider if you may take showers. You may only be allowed to take sponge baths.   Activity  Follow instructions from your health care provider about any activity limitations, including lifting heavy objects.  If you were given a sedative during the procedure, it can affect you for several hours. Do not  drive or operate machinery until your health care provider says that it is safe.   General instructions  Take over-the-counter and prescription medicines only as told by your health care provider.  Do not use any products that contain nicotine or tobacco, such as cigarettes, e-cigarettes, and chewing tobacco. These can delay incision healing. If you need help quitting, ask your health care provider.  Keep all follow-up visits as told by your health care provider. This is important. Contact a health care provider if:  You have increased pain.  You have a fever.  You have nausea or vomiting.  You have any of these signs of infection: ? More redness, swelling, or pain around your treatment area. ? Fluid or blood coming from your treatment area. ? Warmth coming from your treatment area. ? Pus or a bad smell coming from your treatment area.  You do not have a bowel movement for 2 days. Get help right away if you have:  Severe pain.  Trouble swallowing or breathing.  Severe weakness or dizziness.  Chest pain or shortness of breath. These symptoms may represent a serious problem that is an emergency. Do not wait to see if the symptoms will go away. Get medical help right away. Call your local emergency services (911 in the [place] Do not drive yourself to the hospital. Summary  After the procedure, it is common for the treatment area to be sore, mildly painful, and swollen.  If you have a dressing, change  it as told by your health care provider.  Follow instructions from your health care provider about any activity limitations.  Contact a health care provider if you have increased pain or a fever. This information is not intended to replace advice given to you by your health care provider. Make sure you discuss any questions you have with your health care provider. Document Revised: 08/09/2019 Document Reviewed: 08/09/2019 Elsevier Patient Education  [place].

## 2021-03-30 NOTE — Progress Notes (Signed)
Subjective: CC: DM PCP: [name], DO [id number] [name] is a 65 y.o. male presenting to clinic today for:  1. Type 2 Diabetes with CKD3b, hypertension, hyperlipidemia:  Patient is closely followed by nephrology.  Last OV was earlier this month.  Creatinine was stable.  No new recommendations.  He admits to increased stress as of late.  His son, he suffers from liver cirrhosis, is currently undergoing MRI today.  He is had some decline in health and he worries about this.  He is compliant with all medications.  Last eye exam: UTD Last foot exam: UTD Last A1c:  Lab Results  Component Value Date   HGBA1C 6.9 11/28/2020   Nephropathy screen indicated?: UTD Last flu, zoster and/or pneumovax:  Immunization History  Administered Date(s) Administered  . Influenza,inj,Quad PF,6+ Mos 08/25/2017, 09/01/2018  . Influenza,inj,quad, With Preservative 09/15/2017, 09/11/2018  . Pneumococcal Polysaccharide-23 04/14/2017    ROS: No headaches, dizziness, edema.  He has had some atypical left-sided chest pain that is intermittent and lasts a few minutes.  Sometimes associated with activity but sometimes not.  He points to his left axillary region and into the left side of his chest.  He denies any associated nausea, vomiting, diaphoresis or shortness of breath.  Family medical history significant for MI in his father, who passed away from a major heart attack at age 37.  He does not have a cardiologist but would be pleased to see one.   ROS: Per HPI  No Known Allergies Past Medical History:  Diagnosis Date  . Chronic kidney disease   . Complication of anesthesia    Hard to wake  . Degenerative joint disease (DJD) of lumbar spine   . Diabetes mellitus without complication ([place])    Type II  . Essential hypertension 02/23/2017  . Hypertension   . Lumbar stenosis   . Proteinuria   . Radicular leg pain     Current Outpatient Medications:  .  amLODipine (NORVASC) 10 MG tablet,  Take 1 tablet (10 mg total) by mouth daily., Disp: 90 tablet, Rfl: 3 .  cholecalciferol (VITAMIN D3) 25 MCG (1000 UNIT) tablet, Take 2,000 Units by mouth daily., Disp: , Rfl:  .  cloNIDine (CATAPRES) 0.3 MG tablet, Take 1 tablet (0.3 mg total) by mouth 2 (two) times daily., Disp: 180 tablet, Rfl: 3 .  ferrous sulfate 325 (65 FE) MG tablet, Take 325 mg by mouth daily with breakfast., Disp: , Rfl:  .  gabapentin (NEURONTIN) 100 MG capsule, TAKE (1) CAPSULE THREE TIMES DAILY., Disp: 90 capsule, Rfl: 0 .  gabapentin (NEURONTIN) 100 MG capsule, Take 1 capsule (100 mg total) by mouth 2 (two) times daily., Disp: 180 capsule, Rfl: 0 .  insulin degludec (TRESIBA FLEXTOUCH) 100 UNIT/ML FlexTouch Pen, Inject 30-36 Units into the skin every morning. Increase by one unit every other day until fasting sugar is below 150, Disp: 12 mL, Rfl: 2 .  Insulin Pen Needle (PEN NEEDLES) 31G X 5 MM MISC, Use daily with insulin Dx E11.9, Disp: 100 each, Rfl: 3 .  loratadine (CLARITIN) 10 MG tablet, Take 1 tablet (10 mg total) by mouth every other day. (renally dosed), Disp: 30 tablet, Rfl: 11 .  losartan (COZAAR) 25 MG tablet, Take 12.5 mg by mouth daily., Disp: , Rfl:  .  tamsulosin (FLOMAX) 0.4 MG CAPS capsule, Take 1 capsule (0.4 mg total) by mouth daily after supper., Disp: 90 capsule, Rfl: 3 .  vitamin B-12 (CYANOCOBALAMIN) 500 MCG tablet, Take 500  mcg by mouth daily., Disp: , Rfl:  .  sildenafil (VIAGRA) 25 MG tablet, Take 1-2 tablets (25-50 mg total) by mouth daily as needed for erectile dysfunction. (Patient not taking: Reported on 03/30/2021), Disp: 10 tablet, Rfl: 3 Social History   Socioeconomic History  . Marital status: Married    Spouse name: Not on file  . Number of children: 3  . Years of education: Not on file  . Highest education level: Not on file  Occupational History  . Not on file  Tobacco Use  . Smoking status: Never Smoker  . Smokeless tobacco: Never Used  Vaping Use  . Vaping Use: Never used   Substance and Sexual Activity  . Alcohol use: Yes    Alcohol/week: 6.0 standard drinks    Types: 6 Cans of beer per week  . Drug use: No  . Sexual activity: Yes  Other Topics Concern  . Not on file  Social History Narrative   Lives with his wife - Has a son with cirrhosis who he stays with a lot. Has another son and one daughter    Social Determinants of Radio broadcast assistant Strain: Low Risk   . Difficulty of Paying Living Expenses: Not hard at all  Food Insecurity: No Food Insecurity  . Worried About Charity fundraiser in the Last Year: Never true  . Ran Out of Food in the Last Year: Never true  Transportation Needs: No Transportation Needs  . Lack of Transportation (Medical): No  . Lack of Transportation (Non-Medical): No  Physical Activity: Sufficiently Active  . Days of Exercise per Week: 7 days  . Minutes of Exercise per Session: 60 min  Stress: No Stress Concern Present  . Feeling of Stress : Not at all  Social Connections: Moderately Isolated  . Frequency of Communication with Friends and Family: More than three times a week  . Frequency of Social Gatherings with Friends and Family: More than three times a week  . Attends Religious Services: Never  . Active Member of Clubs or Organizations: No  . Attends Archivist Meetings: Never  . Marital Status: Married  Human resources officer Violence: Not At Risk  . Fear of Current or Ex-Partner: No  . Emotionally Abused: No  . Physically Abused: No  . Sexually Abused: No   Family History  Problem Relation Age of Onset  . Heart attack Father   . Hypertension Father   . Vision loss Sister        one eye  . Cancer Brother        skin  . Diabetes Brother     Objective: Office vital signs reviewed. BP (!) 171/80   Pulse 65   Temp 97.8 F (36.6 C)   Ht 6' (1.829 m)   Wt 202 lb 6.4 oz (91.8 kg)   SpO2 99%   BMI 27.45 kg/m   Physical Examination:  General: Awake, alert, well nourished, No acute  distress Cardio: regular rate and rhythm, S1S2 heard, no murmurs appreciated Pulm: clear to auscultation bilaterally, no wheezes, rhonchi or rales; normal work of breathing on room air Skin: Multiple actinic keratoses noted.  He has at least 3 lesions along the left forearm, 2 along the right forearm and one along the lobe of the left ear.  Cryotherapy Procedure:  Risks and benefits of procedure were reviewed with the patient.  Written consent obtained and scanned into the chart.  Lesion of concern was identified and located on left  ear lobe.  Liquid nitrogen was applied to area of concern and extending out 1 millimeters beyond the border of the lesion.  Treated area was allowed to come back to room temperature before treating it a second time.  Patient tolerated procedure well and there were no immediate complications.  Home care instructions were reviewed with the patient and a handout was provided.  Cryotherapy Procedure:  Risks and benefits of procedure were reviewed with the patient.  Written consent obtained and scanned into the chart.  Lesion of concern was identified and located on left forearm x2.  Liquid nitrogen was applied to area of concern and extending out 1 millimeters beyond the border of the lesion.  Treated area was allowed to come back to room temperature before treating it a second time.  Patient tolerated procedure well and there were no immediate complications.  Home care instructions were reviewed with the patient and a handout was provided.  Cryotherapy Procedure:  Risks and benefits of procedure were reviewed with the patient.  Written consent obtained and scanned into the chart.  Lesion of concern was identified and located on right forearm x2.  Liquid nitrogen was applied to area of concern and extending out 1 millimeters beyond the border of the lesion.  Treated area was allowed to come back to room temperature before treating it a second time.  Patient tolerated procedure  well and there were no immediate complications.  Home care instructions were reviewed with the patient and a handout was provided.  Assessment/ Plan: 65 y.o. male   Type 2 diabetes mellitus with stage 3b chronic kidney disease, without long-term current use of insulin ([place]) - Plan: Bayer DCA Hb A1c Waived, Ambulatory referral to Cardiology  Hypertension associated with diabetes ([place])  Hyperlipidemia associated with type 2 diabetes mellitus ([place])  History of amputation of great toe ([place])  Personal history of diabetic foot ulcer  Atypical chest pain - Plan: EKG 12-Lead, Ambulatory referral to Cardiology  Family history of heart attack - Plan: Ambulatory referral to Cardiology  Actinic keratoses  Sugar is not at goal.  A1c up to 7.1.  I discussed with him carb modification.  For now, continue 31 units of [place] daily.  Blood pressure not at goal.  However, I reviewed his last nephrologist note and at that time his blood pressure was at goal.  I think this elevation in blood pressure to be situational.  I did obtain EKG due to atypical chest pain which showed no evidence of ischemia.  Nevertheless, I have referred him to cardiology given family history of heart disease and increased personal risk of heart disease given hypertension, hyperlipidemia, diabetes and CKD. Will CC note to his nephrologist as FYI but anticipate if increased blood pressure medication needed we would advance losartan to 25 mg daily  Multiple actinic keratoses treated with cryotherapy today.  I certainly think he needs a follow-up visit with his dermatologist.  I have given him their contact information.  Would benefit from full-body exam given Rodney Booze class 1-2 with evidence of skin damage and multiple AK's.  Orders Placed This Encounter  Procedures  . Bayer DCA Hb A1c Waived   No orders of the defined types were placed in this encounter.    [name], DO [place] [phone]

## 2021-04-07 ENCOUNTER — Encounter: Payer: Self-pay | Admitting: Cardiology

## 2021-04-07 DIAGNOSIS — R072 Precordial pain: Secondary | ICD-10-CM | POA: Insufficient documentation

## 2021-04-07 DIAGNOSIS — R079 Chest pain, unspecified: Secondary | ICD-10-CM | POA: Insufficient documentation

## 2021-04-07 NOTE — Progress Notes (Signed)
Cardiology Office Note   Date:  04/08/2021   ID:  Dianna, Deshler Jul 17, 1956, MRN [id number]  PCP:  [name], DO  Cardiologist:   None Referring:  [name], DO  Chief Complaint  Patient presents with  . Chest Pain      History of Present Illness: [name] is a 65 y.o. male who presents for evaluation of chest pain.  He had a cardiac catheterization years ago in 2008 though he does not remember why and I do not see the results but he was not told he had any coronary disease.  He does have a family history with his father dying in his early 67s suddenly of a heart attack.  Patient does have cardiovascular risk factors.  He has renal insufficiency and I see that his creatinine was around 2-1/2 of one-point although it comes down to 1.86 and he is followed by nephrology.  He has had difficult to control hypertension.  He does have diabetes.  He has been describing chest discomfort.  This is really from his left shoulder to left upper sternal.  It happens sporadically.  It is 3 out of 10 in intensity.  It might last 1 to 2 minutes.  It happens several times a week.  Its been going on for about 2 months and he thinks a stable pattern.  He might have a little discomfort when he moves that shoulder.  He has discomfort when he moves the other shoulder.  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  He does not really get the pain during that necessarily.  Rather it happens sporadically.  He does not describe associated nausea vomiting or diaphoresis.  He does not have shortness of breath, PND or orthopnea.   Past Medical History:  Diagnosis Date  . Chronic kidney disease   . Complication of anesthesia    Hard to wake  . Degenerative joint disease (DJD) of lumbar spine   . Diabetes mellitus without complication ([place])    Type II  . Essential hypertension 02/23/2017  . Lumbar stenosis      Past Surgical History:  Procedure Laterality Date  . AMPUTATION TOE Right 05/17/2020   Procedure: AMPUTATION TOE partial right great toe;  Surgeon: Evelina Bucy, DPM;  Location: [facility];  Service: Podiatry;  Laterality: Right;  . BELPHAROPTOSIS REPAIR    . CARDIAC CATHETERIZATION    . EYE SURGERY    . HEMORRHOID SURGERY    . skin cancer removed right ear    . TRANSFORAMINAL LUMBAR INTERBODY FUSION (TLIF) WITH PEDICLE SCREW FIXATION 2 LEVEL N/A 07/06/2018   Procedure: TRANSFORAMINAL LUMBAR INTERBODY FUSION (TLIF) L4-S1;  Surgeon: Melina Schools, MD;  Location: [facility];  Service: Orthopedics;  Laterality: N/A;     Current Outpatient Medications  Medication Sig Dispense Refill  . amLODipine (NORVASC) 10 MG tablet Take 1 tablet (10 mg total) by mouth daily. 90 tablet 3  . cholecalciferol (VITAMIN D3) 25 MCG (1000 UNIT) tablet Take 2,000 Units by mouth daily.    . cloNIDine (CATAPRES) 0.3 MG tablet Take 1 tablet (0.3 mg total) by mouth 2 (two) times daily. 180 tablet 3  . ferrous sulfate 325 (65 FE) MG tablet Take 325 mg by mouth daily with breakfast.    . gabapentin (NEURONTIN) 100 MG capsule Take 1 capsule (100 mg total) by mouth 2 (two) times daily. 180 capsule 2  .  insulin degludec (TRESIBA FLEXTOUCH) 100 UNIT/ML FlexTouch Pen Inject 30-36 Units into the skin every morning. Increase by one unit every other day until fasting sugar is below 150 12 mL 2  . Insulin Pen Needle (PEN NEEDLES) 31G X 5 MM MISC Use daily with insulin Dx E11.9 100 each 3  . loratadine (CLARITIN) 10 MG tablet Take 1 tablet (10 mg total) by mouth every other day. (renally dosed) 30 tablet 11  . losartan (COZAAR) 25 MG tablet Take 12.5 mg by mouth daily.    . sildenafil (VIAGRA) 25 MG tablet Take 1-2 tablets (25-50 mg total) by mouth daily as needed for erectile dysfunction. 10 tablet 3  . tamsulosin (FLOMAX) 0.4 MG CAPS capsule Take 1 capsule (0.4 mg total) by mouth daily after supper. 90 capsule 3  . vitamin B-12  (CYANOCOBALAMIN) 500 MCG tablet Take 500 mcg by mouth daily.     No current facility-administered medications for this visit.    Allergies:   Patient has no known allergies.    Social History:  The patient  reports that he has never smoked. He has never used smokeless tobacco. He reports current alcohol use of about 6.0 standard drinks of alcohol per week. He reports that he does not use drugs.   Family History:  The patient's family history includes Cancer in his brother; Diabetes in his brother; Heart attack (age of onset: 42) in his father; Hypertension in his father; Vision loss in his sister.    ROS:  Please see the history of present illness.   Otherwise, review of systems are positive for none.   All other systems are reviewed and negative.    PHYSICAL EXAM: VS:  BP (!) 174/86   Pulse 78   Ht 6' (1.829 m)   Wt 205 lb (93 kg)   BMI 27.80 kg/m  , BMI Body mass index is 27.8 kg/m. GENERAL:  Well appearing HEENT:  Pupils equal round and reactive, fundi not visualized, oral mucosa unremarkable NECK:  No jugular venous distention, waveform within normal limits, carotid upstroke brisk and symmetric, no bruits, no thyromegaly LYMPHATICS:  No cervical, inguinal adenopathy LUNGS:  Clear to auscultation bilaterally BACK:  No CVA tenderness CHEST:  Unremarkable HEART:  PMI not displaced or sustained,S1 and S2 within normal limits, no S3, no S4, no clicks, no rubs, no murmurs ABD:  Flat, positive bowel sounds normal in frequency in pitch, no bruits, no rebound, no guarding, no midline pulsatile mass, no hepatomegaly, no splenomegaly EXT:  2 plus pulses throughout, no edema, no cyanosis no clubbing SKIN:  No rashes no nodules NEURO:  Cranial nerves II through XII grossly intact, motor grossly intact throughout PSYCH:  Cognitively intact, oriented to person place and time    EKG:  EKG is not ordered today. The ekg ordered today demonstrates sinus rhythm, rate 63, RSR prime V1 and V2,  first-degree AV block, left axis deviation, 03/30/2021   Recent Labs: 05/18/2020: ALT 11; BUN 19; Creatinine, Ser 2.41; Hemoglobin 9.1; Platelets 302; Potassium 4.1; Sodium 139    Lipid Panel    Component Value Date/Time   CHOL 138 10/01/2019 1133   TRIG 175 (H) 10/01/2019 1133   HDL 37 (L) 10/01/2019 1133   CHOLHDL 3.7 10/01/2019 1133   LDLCALC 71 10/01/2019 1133      Wt Readings from Last 3 Encounters:  04/08/21 205 lb (93 kg)  03/30/21 202 lb 6.4 oz (91.8 kg)  03/10/21 212 lb (96.2 kg)  Other studies Reviewed: Additional studies/ records that were reviewed today include: Labs. Review of the above records demonstrates:  Please see elsewhere in the note.     ASSESSMENT AND PLAN:  PRECORDIAL CHEST PAIN:   The chest discomfort has some typical and some nonanginal features.  He has significant cardiovascular risk factors.  I do not think to be able to walk on a treadmill and he needs to be seen for obstructive coronary disease.  (His gait is a little bit off and stable.)  Therefore, I am going to send him for a [employer].  HTN: His blood pressure is elevated but he is having this managed by his nephrologist.  He was recently started on Cozaar which is going to be increased gingerly.  DYSLIPIDEMIA: LDL 71 with an HDL of 37.  CKD IIIb:  Per nephrology.   DM: A1c is 7.1.  Plan per [name], DO   Current medicines are reviewed at length with the patient today.  The patient does not have concerns regarding medicines.  The following changes have been made:  no change  Labs/ tests ordered today include:   Orders Placed This Encounter  Procedures  . NM Myocar Multi W/Spect W/Wall Motion / EF     Disposition:   FU with     Signed, Minus Breeding, MD  04/08/2021 2:43 PM    [place]

## 2021-04-08 ENCOUNTER — Other Ambulatory Visit: Payer: Self-pay

## 2021-04-08 ENCOUNTER — Ambulatory Visit (INDEPENDENT_AMBULATORY_CARE_PROVIDER_SITE_OTHER): Payer: PPO | Admitting: *Deleted

## 2021-04-08 ENCOUNTER — Encounter: Payer: Self-pay | Admitting: Cardiology

## 2021-04-08 ENCOUNTER — Ambulatory Visit: Payer: PPO | Admitting: Cardiology

## 2021-04-08 VITALS — BP 174/86 | HR 78 | Ht 72.0 in | Wt 205.0 lb

## 2021-04-08 DIAGNOSIS — R072 Precordial pain: Secondary | ICD-10-CM

## 2021-04-08 DIAGNOSIS — N1832 Chronic kidney disease, stage 3b: Secondary | ICD-10-CM

## 2021-04-08 DIAGNOSIS — E1159 Type 2 diabetes mellitus with other circulatory complications: Secondary | ICD-10-CM

## 2021-04-08 DIAGNOSIS — E1122 Type 2 diabetes mellitus with diabetic chronic kidney disease: Secondary | ICD-10-CM

## 2021-04-08 DIAGNOSIS — I10 Essential (primary) hypertension: Secondary | ICD-10-CM | POA: Diagnosis not present

## 2021-04-08 DIAGNOSIS — I152 Hypertension secondary to endocrine disorders: Secondary | ICD-10-CM | POA: Diagnosis not present

## 2021-04-08 NOTE — Addendum Note (Signed)
Addended by: Shellia Cleverly on: 04/08/2021 03:18 PM   Modules accepted: Orders

## 2021-04-08 NOTE — Patient Instructions (Signed)
Medication Instructions:  The current medical regimen is effective;  continue present plan and medications.  *If you need a refill on your cardiac medications before your next appointment, please call your pharmacy*  Lab Work: Please have fasting Lipid panel the same day as your stress test. (Lipid)  If you have labs (blood work) drawn today and your tests are completely normal, you will receive your results only by: Marland Kitchen MyChart Message (if you have MyChart) OR . A paper copy in the mail If you have any lab test that is abnormal or we need to change your treatment, we will call you to review the results.  Testing/Procedures: Your physician has requested that you have a lexiscan myoview to be completed at [facility].  Please check in at the Main Entrance the day of your test.  Nothing to eat/drink 3 hours before except water.  You may take your normal medications this day.  No caffeine or decaff 12 hours before.  Wear 2 piece comfortable clothes. You will be contact to be scheduled.   Follow-Up: At [facility], you and your health needs are our priority.  As part of our continuing mission to provide you with exceptional heart care, we have created designated Provider Care Teams.  These Care Teams include your primary Cardiologist (physician) and Advanced Practice Providers (APPs -  Physician Assistants and Nurse Practitioners) who all work together to provide you with the care you need, when you need it.  We recommend signing up for the patient portal called "MyChart".  Sign up information is provided on this After Visit Summary.  MyChart is used to connect with patients for Virtual Visits (Telemedicine).  Patients are able to view lab/test results, encounter notes, upcoming appointments, etc.  Non-urgent messages can be sent to your provider as well.   To learn more about what you can do with MyChart, go to [email or web address].    Your next appointment:   4 month(s)  The format for your  next appointment:   In Person  Provider:   Minus Breeding, MD   Thank you for choosing [facility]!!

## 2021-04-08 NOTE — Patient Instructions (Signed)
Visit Information  PATIENT GOALS: Goals Addressed            This Visit's Progress   . Monitor and Manage My Blood Sugar-Diabetes Type 2   On track    Timeframe:  Long-Range Goal Priority:  Medium Start Date:                             Expected End Date:                       Follow Up Date 05/12/21   . check blood sugar at prescribed times . check blood sugar if I feel it is too high or too low . enter blood sugar readings and medication or insulin into daily log . take the blood sugar log to all doctor visits . take the blood sugar meter to all doctor visits  . Call PCP with any readings outside of recommended range . Eat a healthy diet. Limit sugar and simple carbohydrates.  . Fill plate halfway with vegetables . Eat lean meats . Schedule an appt with eye doctor for diabetic eye exam . Increase water intake . Keep all medical appointments . Remain physically active daily with a goal of at least 150 minutes of moderate physical activity   Why is this important?    Checking your blood sugar at home helps to keep it from getting very high or very low.   Writing the results in a diary or log helps the doctor know how to care for you.   Your blood sugar log should have the time, date and the results.   Also, write down the amount of insulin or other medicine that you take.   Other information, like what you ate, exercise done and how you were feeling, will also be helpful.     Notes:     . Track and Manage My Blood Pressure-Hypertension   Not on track    Timeframe:  Long-Range Goal Priority:  Medium Start Date:                             Expected End Date:                       Follow Up Date 05/12/21   . check blood pressure daily . write blood pressure results in a log or diary  . Take blood pressure log to PCP appointments . Call PCP or nephrologist with any readings outside of recommended range . Increase water intake . Keep all medical appointments . Call  PCP for appt or seek medical attention for any new or worsening symptoms . Take all medications as directed   Why is this important?    You won't feel high blood pressure, but it can still hurt your blood vessels.   High blood pressure can cause heart or kidney problems. It can also cause a stroke.   Making lifestyle changes like losing a little weight or eating less salt will help.   Checking your blood pressure at home and at different times of the day can help to control blood pressure.   If the doctor prescribes medicine remember to take it the way the doctor ordered.   Call the office if you cannot afford the medicine or if there are questions about it.     Notes:  The patient verbalized understanding of instructions, educational materials, and care plan provided today and declined offer to receive copy of patient instructions, educational materials, and care plan.   Follow Up Plan:  . Telephone follow up appointment with care management team member scheduled for:  05/12/21 with RN Care Manager . The patient has been provided with contact information for the care management team and has been advised to call with any health related questions or concerns  Chong Sicilian, BSN, RN-BC [place] [facility] [place] Management Direct Dial: [phone]

## 2021-04-08 NOTE — Chronic Care Management (AMB) (Signed)
Chronic Care Management   CCM RN Visit Note  04/08/2021 Name: [name] MRN: [id number] DOB: 1956-04-01  Subjective: [name] is a 65 y.o. year old male who is a primary care patient of [name], DO. The care management team was consulted for assistance with disease management and care coordination needs.    Engaged with patient by telephone for follow up visit in response to provider referral for case management and/or care coordination services.   Consent to Services:  The patient was given information about Chronic Care Management services, agreed to services, and gave verbal consent prior to initiation of services.  Please see initial visit note for detailed documentation.   Patient agreed to services and verbal consent obtained.   Assessment: Review of patient past medical history, allergies, medications, health status, including review of consultants reports, laboratory and other test data, was performed as part of comprehensive evaluation and provision of chronic care management services.   SDOH (Social Determinants of Health) assessments and interventions performed:    CCM Care Plan  No Known Allergies  Outpatient Encounter Medications as of 04/08/2021  Medication Sig Note  . amLODipine (NORVASC) 10 MG tablet Take 1 tablet (10 mg total) by mouth daily.   . cholecalciferol (VITAMIN D3) 25 MCG (1000 UNIT) tablet Take 2,000 Units by mouth daily.   . cloNIDine (CATAPRES) 0.3 MG tablet Take 1 tablet (0.3 mg total) by mouth 2 (two) times daily.   . ferrous sulfate 325 (65 FE) MG tablet Take 325 mg by mouth daily with breakfast. 12/01/2020: Not taking regularly  . gabapentin (NEURONTIN) 100 MG capsule Take 1 capsule (100 mg total) by mouth 2 (two) times daily.   . insulin degludec (TRESIBA FLEXTOUCH) 100 UNIT/ML FlexTouch Pen Inject 30-36 Units into the skin every morning. Increase by one unit every other day until fasting sugar is below 150   .  Insulin Pen Needle (PEN NEEDLES) 31G X 5 MM MISC Use daily with insulin Dx E11.9   . loratadine (CLARITIN) 10 MG tablet Take 1 tablet (10 mg total) by mouth every other day. (renally dosed)   . losartan (COZAAR) 25 MG tablet Take 12.5 mg by mouth daily.   . sildenafil (VIAGRA) 25 MG tablet Take 1-2 tablets (25-50 mg total) by mouth daily as needed for erectile dysfunction. (Patient not taking: Reported on 03/30/2021)   . tamsulosin (FLOMAX) 0.4 MG CAPS capsule Take 1 capsule (0.4 mg total) by mouth daily after supper.   . vitamin B-12 (CYANOCOBALAMIN) 500 MCG tablet Take 500 mcg by mouth daily.    No facility-administered encounter medications on file as of 04/08/2021.    Patient Active Problem List   Diagnosis Date Noted  . Precordial chest pain 04/07/2021  . Personal history of diabetic foot ulcer 06/18/2020  . History of amputation of great toe ([place]) 06/18/2020  . Osteomyelitis of great toe of right foot ([place])   . Diabetic ulcer of toe of right foot associated with diabetes mellitus due to underlying condition, with necrosis of bone ([place])   . Osteomyelitis ([place]) 05/15/2020  . AKI (acute kidney injury) ([place]) 05/15/2020  . OSA (obstructive sleep apnea) 05/15/2020  . Acute kidney failure, unspecified ([place]) 05/15/2020  . Cellulitis of right foot 05/07/2020  . Type 2 diabetes mellitus ([place]) 05/07/2020  . Cellulitis of right lower extremity   . Diabetic infection of right foot ([place])   . Cellulitis of great toe, right   . Diabetic ulcer of toe of right  foot associated with type 2 diabetes mellitus, with fat layer exposed ([place]) 04/15/2020  . OSA on CPAP 09/01/2018  . S/P lumbar fusion 07/06/2018  . DDD (degenerative disc disease), lumbar 02/28/2018  . Hypertension associated with diabetes ([place]) 02/23/2017  . Benign prostatic hyperplasia with incomplete bladder emptying 02/23/2017  . Vitamin D deficiency 02/23/2017  . Hyperlipidemia associated with type 2 diabetes mellitus ([place]) 02/23/2017   . Type 2 diabetes mellitus with stage 2 chronic kidney disease, without long-term current use of insulin ([place]) 02/23/2017  . Hyperlipidemia, unspecified 02/23/2017  . 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    Conditions to be addressed/monitored:HTN, DMII and [name]  Care Plan : RNCM:Diabetes Type 2 (Adult)  Updates made by Ilean China, RN since 04/08/2021 12:00 AM    Problem: Diabetes Management   Priority: Medium    Long-Range Goal: Manage Blood Sugar and Limit Diabetes Complications   This Visit's Progress: On track  Recent Progress: On track  Priority: Medium  Note:   Objective: Lab Results  Component Value Date   HGBA1C 7.1 (H) 03/30/2021   HGBA1C 6.9 11/28/2020   HGBA1C 7.0 (H) 08/27/2020   Lab Results  Component Value Date   LDLCALC 71 10/01/2019   CREATININE 2.41 (H) 05/18/2020   Current Barriers:  . Chronic Disease Management support and education needs related to diabetes . Literacy barriers . Overdue for diabetic eye exam  Nurse Case Manager Clinical Goal(s):  . patient will work with PCP to address needs related to medical management of diabetes . patient will meet with RN Care Manager to address self-management of diabetes . patient will demonstrate improved adherence to prescribed treatment plan for diabetes as evidenced byan A1C of 7 or less .   Interventions:  . 1:1 collaboration with [name], DO regarding development and update of comprehensive plan of care as evidenced by provider attestation and co-signature . Inter-disciplinary care team collaboration (see longitudinal plan of care) . Evaluation of current treatment plan related to diabetes and patient's adherence to plan as established by provider. . Chart reviewed including relevant office notes and lab results . Discussed recent office visits with PCP and nephrologist . Reviewed lab results and discussed A1C . Reviewed and discussed medications and importance of  compliance . Discussed home blood sugar readings o Checking blood sugar each morning o Typically below 150 o No readings below 100 o Has had some slightly over 200 . Reviewed and discussed medications o Injecting [place] each morning o Medications are affordable . Reviewed and Reinforced dietary recommendations o Does not follow a special diet o Does not eat fruits or vegetables daily o Mainly eats meat and bread (raises his own before) o Doesn't drink sweetened drinks o Encouraged to increase vegetable intake o Encouraged to limit simple carbs and be careful of eating too many high sugar fruits o Encouraged to increase water intake . 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  . Encouraged patient to continue daily foot checks and report any sores or calluses  o Has a hx of osteomyelitis and partial toe amputation  . Previously consulted with [place] [employer] regarding Diabetic Retinopathy Screening Report o Report was inconclusive  . Reinforced need to schedule a diabetic eye exam with eye care provider . Reviewed upcoming appointments . Encouraged patient to reach out to PCP  with any blood sugar readings outside of recommended range . Encouraged patient to reach out to [facility] as needed  Patient Goals/Self-Care Activities Over the next 90 days, patient will: . check blood sugar at prescribed times . check blood sugar if I feel it is too high or too low . enter blood sugar readings and medication or insulin into daily log . take the blood sugar log to all doctor visits . take the blood sugar meter to all doctor visits  . Call PCP with any readings outside of recommended range . Eat a healthy diet. Limit sugar and simple carbohydrates.  . Fill plate halfway with vegetables . Eat lean meats . Schedule an appt with eye  doctor for diabetic eye exam . Increase water intake . Keep all medical appointments . Remain physically active daily with a goal of at least 150 minutes of moderate physical activity    Care Plan : RNCM:Hypertension (Adult)  Updates made by Ilean China, RN since 04/08/2021 12:00 AM    Problem: Hypertension   Priority: Medium    Long-Range Goal: Hypertension Monitored   This Visit's Progress: On track  Recent Progress: Not on track  Priority: Medium  Note:   Objective: BP Readings from Last 3 Encounters:  03/30/21 (!) 160/70  11/28/20 140/80  08/27/20 139/64   Current Barriers:  . Chronic Disease Management support and education needs related to hypertension . Literacy barriers . Stress . Comorbidities: CKD and DM  Nurse Case Manager Clinical Goal(s):  . patient will work with PCP and nephrologist to address needs related to medical management of hypertension . patient will meet with RN Care Manager to address self-management of hypertension . Demonstrate ongoing health care management as evidenced by checking and recording blood pressure daily and by calling PCP or nephrologist with any readings outside of recommended range  Interventions:  . 1:1 collaboration with [name], DO regarding development and update of comprehensive plan of care as evidenced by provider attestation and co-signature . Inter-disciplinary care team collaboration (see longitudinal plan of care) . Evaluation of current treatment plan related to hypertension and patient's adherence to plan as established by provider. . Chart reviewed including relevant office notes and lab results . Discussed recent in-office blood pressure readings . Reviewed and discussed medications and importance of compliance o Per office notes, Dr [name] may increase losartan at next visit . Discussed home blood pressure testing o Checks blood pressure but inconsistently o Has been running a little high at  home . Encouraged patient to check and record blood pressure daily . Advised patient to bring blood pressure log to PCP and nephrology visits . Advised patient to call PCP or nephrologist with any readings outside of recommended range . Reviewed and discussed referral to cardiology for chest pain o Spontaneous episodes of chest pain that isn't related to activity o EKG at PCP OV was normal . Discussed family hx of heart disease. Father died with MI at 76. No other relatives with CAD or MI that he is aware of. . Discussed personal risk factors . 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 Reviewed upcoming appointments including new patient cardiology evaluation today . Encouraged patient to seek medical attention for any new or worsening symptoms . Encouraged patient to Call [facility] as needed  Patient Goals/Self-Care Activities Over the next  90 days, patient will: . check blood pressure daily . write blood pressure results in a log or diary  . Take blood pressure log to PCP appointments . Call PCP or nephrologist with any readings outside of recommended range . Increase water intake . Keep all medical appointments . Call PCP for appt or seek medical attention for any new or worsening symptoms . Take all medications as directed   Follow Up Plan:  . Telephone follow up appointment with care management team member scheduled for:  05/12/21 with RN Care Manager . The patient has been provided with contact information for the care management team and has been advised to call with any health related questions or concerns  Chong Sicilian, BSN, RN-BC [place] [facility] [place] Management Direct Dial: [phone]

## 2021-04-15 NOTE — Addendum Note (Signed)
Addended by: Minus Breeding on: 04/15/2021 02:58 PM   Modules accepted: Orders

## 2021-04-28 ENCOUNTER — Encounter (HOSPITAL_COMMUNITY)
Admission: RE | Admit: 2021-04-28 | Discharge: 2021-04-28 | Disposition: A | Discharge status: Home / Self Care | Payer: PPO | Source: Ambulatory Visit | Attending: Cardiology | Admitting: Cardiology

## 2021-04-28 ENCOUNTER — Encounter (HOSPITAL_BASED_OUTPATIENT_CLINIC_OR_DEPARTMENT_OTHER)
Admission: RE | Admit: 2021-04-28 | Discharge: 2021-04-28 | Disposition: A | Discharge status: Home / Self Care | Payer: PPO | Source: Ambulatory Visit | Attending: Cardiology | Admitting: Cardiology

## 2021-04-28 DIAGNOSIS — I10 Essential (primary) hypertension: Secondary | ICD-10-CM | POA: Insufficient documentation

## 2021-04-28 DIAGNOSIS — R072 Precordial pain: Secondary | ICD-10-CM | POA: Diagnosis not present

## 2021-04-28 LAB — NM MYOCAR MULTI W/SPECT W/WALL MOTION / EF
LV dias vol: 112 mL (ref 62–150)
LV sys vol: 44 mL
Peak HR: 90 {beats}/min
RATE: 0.33
Rest HR: 70 {beats}/min
SDS: 0
SRS: 2
SSS: 2
TID: 0.96

## 2021-04-28 MED ORDER — REGADENOSON 0.4 MG/5ML IV SOLN
INTRAVENOUS | Status: AC
  Administered 2021-04-28: 0.4 mg via INTRAVENOUS
  Filled 2021-04-28: qty 5

## 2021-04-28 MED ORDER — TECHNETIUM TC 99M TETROFOSMIN IV KIT
10.0000 | PACK | Freq: Once | INTRAVENOUS | Status: AC | PRN
  Administered 2021-04-28: 11 via INTRAVENOUS

## 2021-04-28 MED ORDER — SODIUM CHLORIDE FLUSH 0.9 % IV SOLN
INTRAVENOUS | Status: AC
  Administered 2021-04-28: 10 mL via INTRAVENOUS
  Filled 2021-04-28: qty 10

## 2021-04-28 MED ORDER — TECHNETIUM TC 99M TETROFOSMIN IV KIT
30.0000 | PACK | Freq: Once | INTRAVENOUS | Status: AC | PRN
  Administered 2021-04-28: 31.5 via INTRAVENOUS

## 2021-05-11 ENCOUNTER — Other Ambulatory Visit: Payer: Self-pay | Admitting: Family Medicine

## 2021-05-11 DIAGNOSIS — E1122 Type 2 diabetes mellitus with diabetic chronic kidney disease: Secondary | ICD-10-CM

## 2021-05-12 ENCOUNTER — Ambulatory Visit (INDEPENDENT_AMBULATORY_CARE_PROVIDER_SITE_OTHER): Payer: PPO | Admitting: *Deleted

## 2021-05-12 ENCOUNTER — Encounter: Payer: Self-pay | Admitting: *Deleted

## 2021-05-12 DIAGNOSIS — N1832 Chronic kidney disease, stage 3b: Secondary | ICD-10-CM

## 2021-05-12 DIAGNOSIS — I152 Hypertension secondary to endocrine disorders: Secondary | ICD-10-CM

## 2021-05-12 DIAGNOSIS — E1159 Type 2 diabetes mellitus with other circulatory complications: Secondary | ICD-10-CM

## 2021-05-12 DIAGNOSIS — E1122 Type 2 diabetes mellitus with diabetic chronic kidney disease: Secondary | ICD-10-CM | POA: Diagnosis not present

## 2021-05-15 NOTE — Chronic Care Management (AMB) (Addendum)
Chronic Care Management   CCM RN Visit Note  05/12/21 Name: [name] MRN: [id number] DOB: Dec 22, 1955  Subjective: [name] is a 65 y.o. year old male who is a primary care patient of Clifford Norlander, DO. The care management team was consulted for assistance with disease management and care coordination needs.    Engaged with patient by telephone for follow up visit in response to provider referral for case management and/or care coordination services.   Consent to Services:  The patient was given information about Chronic Care Management services, agreed to services, and gave verbal consent prior to initiation of services.  Please see initial visit note for detailed documentation.   Patient agreed to services and verbal consent obtained.   Assessment: Review of patient past medical history, allergies, medications, health status, including review of consultants reports, laboratory and other test data, was performed as part of comprehensive evaluation and provision of chronic care management services.   SDOH (Social Determinants of Health) assessments and interventions performed:    CCM Care Plan  No Known Allergies  Outpatient Encounter Medications as of 05/12/2021  Medication Sig Note   amLODipine (NORVASC) 10 MG tablet Take 1 tablet (10 mg total) by mouth daily.    cholecalciferol (VITAMIN D3) 25 MCG (1000 UNIT) tablet Take 2,000 Units by mouth daily.    cloNIDine (CATAPRES) 0.3 MG tablet Take 1 tablet (0.3 mg total) by mouth 2 (two) times daily.    ferrous sulfate 325 (65 FE) MG tablet Take 325 mg by mouth daily with breakfast. 12/01/2020: Not taking regularly   gabapentin (NEURONTIN) 100 MG capsule Take 1 capsule (100 mg total) by mouth 2 (two) times daily.    Insulin Pen Needle (PEN NEEDLES) 31G X 5 MM MISC Use daily with insulin Dx E11.9    loratadine (CLARITIN) 10 MG tablet Take 1 tablet (10 mg total) by mouth every other day. (renally dosed)     losartan (COZAAR) 25 MG tablet Take 12.5 mg by mouth daily.    sildenafil (VIAGRA) 25 MG tablet Take 1-2 tablets (25-50 mg total) by mouth daily as needed for erectile dysfunction.    tamsulosin (FLOMAX) 0.4 MG CAPS capsule Take 1 capsule (0.4 mg total) by mouth daily after supper.    TRESIBA FLEXTOUCH 100 UNIT/ML FlexTouch Pen INJECT UP TO 36 UNITS EVERY MORNING. (MAY INCREASE BY 1 UNIT EVERY OTHER DAY TIL FBS BELOW 150)    vitamin B-12 (CYANOCOBALAMIN) 500 MCG tablet Take 500 mcg by mouth daily.    No facility-administered encounter medications on file as of 05/12/2021.    Patient Active Problem List   Diagnosis Date Noted   Precordial chest pain 04/07/2021   Personal history of diabetic foot ulcer 06/18/2020   History of amputation of great toe ([place]) 06/18/2020   Osteomyelitis of great toe of right foot (HCC)    Diabetic ulcer of toe of right foot associated with diabetes mellitus due to underlying condition, with necrosis of bone ([place])    Osteomyelitis ([place]) 05/15/2020   AKI (acute kidney injury) ([place]) 05/15/2020   OSA (obstructive sleep apnea) 05/15/2020   Acute kidney failure, unspecified ([place]) 05/15/2020   Cellulitis of right foot 05/07/2020   Type 2 diabetes mellitus ([place]) 05/07/2020   Cellulitis of right lower extremity    Diabetic infection of right foot (HCC)    Cellulitis of great toe, right    Diabetic ulcer of toe of right foot associated with type 2 diabetes mellitus, with fat layer  exposed ([place]) 04/15/2020   OSA on CPAP 09/01/2018   S/P lumbar fusion 07/06/2018   DDD (degenerative disc disease), lumbar 02/28/2018   Hypertension associated with diabetes ([place]) 02/23/2017   Benign prostatic hyperplasia with incomplete bladder emptying 02/23/2017   Vitamin D deficiency 02/23/2017   Hyperlipidemia associated with type 2 diabetes mellitus ([place]) 02/23/2017   Type 2 diabetes mellitus with stage 2 chronic kidney disease, without long-term current use of insulin ([place]) 02/23/2017    Hyperlipidemia, unspecified 02/23/2017   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    Conditions to be addressed/monitored:HTN and DMII  Care Plan : RNCM:Diabetes Type 2 (Adult)     Problem: Diabetes Management   Priority: Medium     Long-Range Goal: Manage Blood Sugar and Limit Diabetes Complications   This Visit's Progress: On track  Recent Progress: On track  Priority: Medium  Note:   Objective: Lab Results  Component Value Date   HGBA1C 7.1 (H) 03/30/2021   HGBA1C 6.9 11/28/2020   HGBA1C 7.0 (H) 08/27/2020   Lab Results  Component Value Date   LDLCALC 71 10/01/2019   CREATININE 2.41 (H) 05/18/2020  Current Barriers:  Chronic Disease Management support and education needs related to diabetes Literacy barriers Overdue for diabetic eye exam  Nurse Case Manager Clinical Goal(s):  patient will work with PCP to address needs related to medical management of diabetes patient will meet with RN Care Manager to address self-management of diabetes patient will demonstrate improved adherence to prescribed treatment plan for diabetes as evidenced byan A1C of 7 or less   Interventions:  1:1 collaboration with Clifford Norlander, DO regarding development and update of comprehensive plan of care as evidenced by provider attestation and co-signature Inter-disciplinary care team collaboration (see longitudinal plan of care) Evaluation of current treatment plan related to diabetes and patient's adherence to plan as established by provider. Chart reviewed including relevant office notes and lab results Reviewed and discussed medications and importance of compliance Discussed home blood sugar readings Reviewed and discussed medications Discussed activity level and ability to perform ADLs Encouraged patient to continue daily foot checks and report any sores or calluses  Reinforced need to schedule a diabetic eye exam with eye care provider Reviewed upcoming  appointments Encouraged patient to reach out to PCP with any blood sugar readings outside of recommended range Encouraged patient to reach out to [facility] as needed  Patient Goals/Self-Care Activities Over the next 90 days, patient will: check blood sugar at prescribed times check blood sugar if I feel it is too high or too low enter blood sugar readings and medication or insulin into daily log take the blood sugar log to all doctor visits take the blood sugar meter to all doctor visits  Call PCP with any readings outside of recommended range Eat a healthy diet. Limit sugar and simple carbohydrates.  Fill plate halfway with vegetables Eat lean meats Schedule an appt with eye doctor for diabetic eye exam Increase water intake Keep all medical appointments Remain physically active daily with a goal of at least 150 minutes of moderate physical activity Call [place] as needed [phone]  Follow Up Plan:  Telephone follow up appointment with care management team member scheduled for: 07/01/21 with RN Care Manager The patient has been provided with contact information for the care management team and has been advised to call with any health related questions or concerns.      Care Plan : RNCM:Hypertension (Adult)  Updates made by Chong Sicilian  N, RN since 05/15/2021 12:00 AM     Problem: Hypertension   Priority: Medium     Long-Range Goal: Hypertension Monitored   This Visit's Progress: On track  Recent Progress: On track  Priority: Medium  Note:   Objective: BP Readings from Last 3 Encounters:  03/30/21 (!) 160/70  11/28/20 140/80  08/27/20 139/64   Current Barriers:  Chronic Disease Management support and education needs related to hypertension Literacy barriers Stress Comorbidities: CKD and DM  Nurse Case Manager Clinical Goal(s):  patient will work with PCP and nephrologist to address needs related to medical management of hypertension patient will meet with RN  Care Manager to address self-management of hypertension Demonstrate ongoing health care management as evidenced by checking and recording blood pressure daily and by calling PCP or nephrologist with any readings outside of recommended range  Interventions:  1:1 collaboration with Clifford Norlander, DO regarding development and update of comprehensive plan of care as evidenced by provider attestation and co-signature Inter-disciplinary care team collaboration (see longitudinal plan of care) Evaluation of current treatment plan related to hypertension and patient's adherence to plan as established by provider. Chart reviewed including relevant office notes and lab results Reinforced need to check and record blood pressure daily Advised patient to bring blood pressure log to PCP and nephrology visits Advised patient to call PCP or nephrologist with any readings outside of recommended range Reviewed and discussed upcoming appointment with cardiologist Encouraged patient to seek medical attention for any new or worsening symptoms Encouraged patient to Call [facility] as needed  Patient Goals/Self-Care Activities Over the next 90 days, patient will: check blood pressure daily write blood pressure results in a log or diary  Take blood pressure log to PCP appointments Call PCP or nephrologist with any readings outside of recommended range Increase water intake Keep all medical appointments Call PCP for appt or seek medical attention for any new or worsening symptoms Take all medications as directed Call RN Care Manager as needed [phone]  Follow Up Plan:  Telephone follow up appointment with care management team member scheduled for:  07/01/21 with RN Care Manager The patient has been provided with contact information for the care management team and has been advised to call with any health related questions or concerns.       Plan:Telephone follow up appointment with care management team  member scheduled for:  07/01/21  Chong Sicilian, BSN, RN-BC [place] / [place] Management Direct Dial: [phone]

## 2021-05-15 NOTE — Patient Instructions (Signed)
Visit Information  PATIENT GOALS:  Goals Addressed             This Visit's Progress    Monitor and Manage My Blood Sugar-Diabetes Type 2   On track    Timeframe:  Long-Range Goal Priority:  Medium Start Date:                             Expected End Date:                       Follow Up Date 07/01/21   check blood sugar at prescribed times check blood sugar if I feel it is too high or too low enter blood sugar readings and medication or insulin into daily log take the blood sugar log to all doctor visits take the blood sugar meter to all doctor visits  Call PCP with any readings outside of recommended range Eat a healthy diet. Limit sugar and simple carbohydrates.  Fill plate halfway with vegetables Eat lean meats Schedule an appt with eye doctor for diabetic eye exam Increase water intake Keep all medical appointments Remain physically active daily with a goal of at least 150 minutes of moderate physical activity Call [place] as needed [phone]   Why is this important?   Checking your blood sugar at home helps to keep it from getting very high or very low.  Writing the results in a diary or log helps the doctor know how to care for you.  Your blood sugar log should have the time, date and the results.  Also, write down the amount of insulin or other medicine that you take.  Other information, like what you ate, exercise done and how you were feeling, will also be helpful.     Notes:       Track and Manage My Blood Pressure-Hypertension   On track    Timeframe:  Long-Range Goal Priority:  Medium Start Date:                             Expected End Date:                       Follow Up Date 07/01/21   check blood pressure daily write blood pressure results in a log or diary  Take blood pressure log to PCP appointments Call PCP or nephrologist with any readings outside of recommended range Increase water intake Keep all medical appointments Call PCP  for appt or seek medical attention for any new or worsening symptoms Take all medications as directed Call RN Care Manager as needed [phone]   Why is this important?   You won't feel high blood pressure, but it can still hurt your blood vessels.  High blood pressure can cause heart or kidney problems. It can also cause a stroke.  Making lifestyle changes like losing a little weight or eating less salt will help.  Checking your blood pressure at home and at different times of the day can help to control blood pressure.  If the doctor prescribes medicine remember to take it the way the doctor ordered.  Call the office if you cannot afford the medicine or if there are questions about it.     Notes:          The patient verbalized understanding of instructions, educational materials, and care  plan provided today and declined offer to receive copy of patient instructions, educational materials, and care plan.   Telephone follow up appointment with care management team member scheduled for: 07/01/21 with RNCM  Chong Sicilian, BSN, RN-BC [place] / [place] Management Direct Dial: [phone]

## 2021-05-20 ENCOUNTER — Other Ambulatory Visit: Payer: Self-pay

## 2021-05-20 ENCOUNTER — Other Ambulatory Visit: Payer: PPO

## 2021-05-20 DIAGNOSIS — E1122 Type 2 diabetes mellitus with diabetic chronic kidney disease: Secondary | ICD-10-CM | POA: Diagnosis not present

## 2021-05-20 DIAGNOSIS — R808 Other proteinuria: Secondary | ICD-10-CM | POA: Diagnosis not present

## 2021-05-20 DIAGNOSIS — N189 Chronic kidney disease, unspecified: Secondary | ICD-10-CM | POA: Diagnosis not present

## 2021-05-20 DIAGNOSIS — R809 Proteinuria, unspecified: Secondary | ICD-10-CM | POA: Diagnosis not present

## 2021-05-20 DIAGNOSIS — Z7689 Persons encountering health services in other specified circumstances: Secondary | ICD-10-CM | POA: Diagnosis not present

## 2021-05-20 DIAGNOSIS — D631 Anemia in chronic kidney disease: Secondary | ICD-10-CM | POA: Diagnosis not present

## 2021-05-20 DIAGNOSIS — I129 Hypertensive chronic kidney disease with stage 1 through stage 4 chronic kidney disease, or unspecified chronic kidney disease: Secondary | ICD-10-CM | POA: Diagnosis not present

## 2021-05-27 DIAGNOSIS — E1129 Type 2 diabetes mellitus with other diabetic kidney complication: Secondary | ICD-10-CM | POA: Diagnosis not present

## 2021-05-27 DIAGNOSIS — N189 Chronic kidney disease, unspecified: Secondary | ICD-10-CM | POA: Diagnosis not present

## 2021-05-27 DIAGNOSIS — R809 Proteinuria, unspecified: Secondary | ICD-10-CM | POA: Diagnosis not present

## 2021-05-27 DIAGNOSIS — E1122 Type 2 diabetes mellitus with diabetic chronic kidney disease: Secondary | ICD-10-CM | POA: Diagnosis not present

## 2021-05-27 DIAGNOSIS — R600 Localized edema: Secondary | ICD-10-CM | POA: Diagnosis not present

## 2021-05-27 DIAGNOSIS — D631 Anemia in chronic kidney disease: Secondary | ICD-10-CM | POA: Diagnosis not present

## 2021-05-27 DIAGNOSIS — I129 Hypertensive chronic kidney disease with stage 1 through stage 4 chronic kidney disease, or unspecified chronic kidney disease: Secondary | ICD-10-CM | POA: Diagnosis not present

## 2021-05-27 DIAGNOSIS — R808 Other proteinuria: Secondary | ICD-10-CM | POA: Diagnosis not present

## 2021-06-07 ENCOUNTER — Inpatient Hospital Stay (HOSPITAL_COMMUNITY)
Admission: EM | Admit: 2021-06-07 | Discharge: 2021-06-12 | DRG: 247 | Disposition: A | Discharge status: Home / Self Care | Payer: PPO | Source: Other Acute Inpatient Hospital | Attending: Interventional Cardiology | Admitting: Interventional Cardiology

## 2021-06-07 ENCOUNTER — Emergency Department (HOSPITAL_COMMUNITY): Payer: PPO

## 2021-06-07 ENCOUNTER — Encounter (HOSPITAL_COMMUNITY): Payer: Self-pay | Admitting: *Deleted

## 2021-06-07 ENCOUNTER — Other Ambulatory Visit: Payer: Self-pay

## 2021-06-07 DIAGNOSIS — E1159 Type 2 diabetes mellitus with other circulatory complications: Secondary | ICD-10-CM | POA: Diagnosis present

## 2021-06-07 DIAGNOSIS — E1122 Type 2 diabetes mellitus with diabetic chronic kidney disease: Secondary | ICD-10-CM | POA: Diagnosis not present

## 2021-06-07 DIAGNOSIS — I2510 Atherosclerotic heart disease of native coronary artery without angina pectoris: Secondary | ICD-10-CM | POA: Diagnosis not present

## 2021-06-07 DIAGNOSIS — Z85828 Personal history of other malignant neoplasm of skin: Secondary | ICD-10-CM

## 2021-06-07 DIAGNOSIS — E1165 Type 2 diabetes mellitus with hyperglycemia: Secondary | ICD-10-CM | POA: Diagnosis present

## 2021-06-07 DIAGNOSIS — Z79899 Other long term (current) drug therapy: Secondary | ICD-10-CM | POA: Diagnosis not present

## 2021-06-07 DIAGNOSIS — Z821 Family history of blindness and visual loss: Secondary | ICD-10-CM | POA: Diagnosis not present

## 2021-06-07 DIAGNOSIS — N1832 Chronic kidney disease, stage 3b: Secondary | ICD-10-CM | POA: Diagnosis not present

## 2021-06-07 DIAGNOSIS — Z20822 Contact with and (suspected) exposure to covid-19: Secondary | ICD-10-CM | POA: Diagnosis present

## 2021-06-07 DIAGNOSIS — E118 Type 2 diabetes mellitus with unspecified complications: Secondary | ICD-10-CM | POA: Diagnosis not present

## 2021-06-07 DIAGNOSIS — I214 Non-ST elevation (NSTEMI) myocardial infarction: Secondary | ICD-10-CM | POA: Diagnosis not present

## 2021-06-07 DIAGNOSIS — R7989 Other specified abnormal findings of blood chemistry: Secondary | ICD-10-CM

## 2021-06-07 DIAGNOSIS — R0789 Other chest pain: Secondary | ICD-10-CM | POA: Diagnosis not present

## 2021-06-07 DIAGNOSIS — E876 Hypokalemia: Secondary | ICD-10-CM | POA: Diagnosis not present

## 2021-06-07 DIAGNOSIS — Z794 Long term (current) use of insulin: Secondary | ICD-10-CM | POA: Diagnosis not present

## 2021-06-07 DIAGNOSIS — E663 Overweight: Secondary | ICD-10-CM | POA: Diagnosis present

## 2021-06-07 DIAGNOSIS — R58 Hemorrhage, not elsewhere classified: Secondary | ICD-10-CM | POA: Diagnosis not present

## 2021-06-07 DIAGNOSIS — N179 Acute kidney failure, unspecified: Secondary | ICD-10-CM | POA: Diagnosis not present

## 2021-06-07 DIAGNOSIS — Z89411 Acquired absence of right great toe: Secondary | ICD-10-CM

## 2021-06-07 DIAGNOSIS — Z6829 Body mass index (BMI) 29.0-29.9, adult: Secondary | ICD-10-CM | POA: Diagnosis not present

## 2021-06-07 DIAGNOSIS — I200 Unstable angina: Secondary | ICD-10-CM | POA: Diagnosis not present

## 2021-06-07 DIAGNOSIS — E1169 Type 2 diabetes mellitus with other specified complication: Secondary | ICD-10-CM | POA: Diagnosis not present

## 2021-06-07 DIAGNOSIS — R778 Other specified abnormalities of plasma proteins: Secondary | ICD-10-CM | POA: Diagnosis not present

## 2021-06-07 DIAGNOSIS — I499 Cardiac arrhythmia, unspecified: Secondary | ICD-10-CM | POA: Diagnosis not present

## 2021-06-07 DIAGNOSIS — R9431 Abnormal electrocardiogram [ECG] [EKG]: Secondary | ICD-10-CM | POA: Diagnosis not present

## 2021-06-07 DIAGNOSIS — I25110 Atherosclerotic heart disease of native coronary artery with unstable angina pectoris: Secondary | ICD-10-CM | POA: Diagnosis not present

## 2021-06-07 DIAGNOSIS — I2584 Coronary atherosclerosis due to calcified coronary lesion: Secondary | ICD-10-CM | POA: Diagnosis present

## 2021-06-07 DIAGNOSIS — I152 Hypertension secondary to endocrine disorders: Secondary | ICD-10-CM | POA: Diagnosis not present

## 2021-06-07 DIAGNOSIS — R079 Chest pain, unspecified: Secondary | ICD-10-CM

## 2021-06-07 DIAGNOSIS — I10 Essential (primary) hypertension: Secondary | ICD-10-CM | POA: Diagnosis not present

## 2021-06-07 DIAGNOSIS — Z833 Family history of diabetes mellitus: Secondary | ICD-10-CM | POA: Diagnosis not present

## 2021-06-07 DIAGNOSIS — Z808 Family history of malignant neoplasm of other organs or systems: Secondary | ICD-10-CM | POA: Diagnosis not present

## 2021-06-07 DIAGNOSIS — N183 Chronic kidney disease, stage 3 unspecified: Secondary | ICD-10-CM | POA: Diagnosis not present

## 2021-06-07 DIAGNOSIS — E1151 Type 2 diabetes mellitus with diabetic peripheral angiopathy without gangrene: Secondary | ICD-10-CM | POA: Diagnosis not present

## 2021-06-07 DIAGNOSIS — Z955 Presence of coronary angioplasty implant and graft: Secondary | ICD-10-CM

## 2021-06-07 DIAGNOSIS — E785 Hyperlipidemia, unspecified: Secondary | ICD-10-CM | POA: Diagnosis present

## 2021-06-07 DIAGNOSIS — Z8249 Family history of ischemic heart disease and other diseases of the circulatory system: Secondary | ICD-10-CM

## 2021-06-07 DIAGNOSIS — E782 Mixed hyperlipidemia: Secondary | ICD-10-CM | POA: Diagnosis not present

## 2021-06-07 HISTORY — DX: Non-ST elevation (NSTEMI) myocardial infarction: I21.4

## 2021-06-07 LAB — CBC WITH DIFFERENTIAL/PLATELET
Abs Immature Granulocytes: 0.06 10*3/uL (ref 0.00–0.07)
Basophils Absolute: 0.1 10*3/uL (ref 0.0–0.1)
Basophils Relative: 0 %
Eosinophils Absolute: 0.1 10*3/uL (ref 0.0–0.5)
Eosinophils Relative: 1 %
HCT: 37.9 % — ABNORMAL LOW (ref 39.0–52.0)
Hemoglobin: 13.5 g/dL (ref 13.0–17.0)
Immature Granulocytes: 1 %
Lymphocytes Relative: 22 %
Lymphs Abs: 2.5 10*3/uL (ref 0.7–4.0)
MCH: 29.9 pg (ref 26.0–34.0)
MCHC: 35.6 g/dL (ref 30.0–36.0)
MCV: 83.8 fL (ref 80.0–100.0)
Monocytes Absolute: 0.5 10*3/uL (ref 0.1–1.0)
Monocytes Relative: 4 %
Neutro Abs: 8.3 10*3/uL — ABNORMAL HIGH (ref 1.7–7.7)
Neutrophils Relative %: 72 %
Platelets: 212 10*3/uL (ref 150–400)
RBC: 4.52 MIL/uL (ref 4.22–5.81)
RDW: 13.6 % (ref 11.5–15.5)
WBC: 11.4 10*3/uL — ABNORMAL HIGH (ref 4.0–10.5)
nRBC: 0 % (ref 0.0–0.2)

## 2021-06-07 LAB — COMPREHENSIVE METABOLIC PANEL
ALT: 15 U/L (ref 0–44)
AST: 22 U/L (ref 15–41)
Albumin: 3.7 g/dL (ref 3.5–5.0)
Alkaline Phosphatase: 93 U/L (ref 38–126)
Anion gap: 9 (ref 5–15)
BUN: 25 mg/dL — ABNORMAL HIGH (ref 8–23)
CO2: 22 mmol/L (ref 22–32)
Calcium: 8.9 mg/dL (ref 8.9–10.3)
Chloride: 105 mmol/L (ref 98–111)
Creatinine, Ser: 1.86 mg/dL — ABNORMAL HIGH (ref 0.61–1.24)
GFR, Estimated: 39 mL/min — ABNORMAL LOW (ref >60)
Glucose, Bld: 238 mg/dL — ABNORMAL HIGH (ref 70–99)
Potassium: 3.3 mmol/L — ABNORMAL LOW (ref 3.5–5.1)
Sodium: 136 mmol/L (ref 135–145)
Total Bilirubin: 0.7 mg/dL (ref 0.3–1.2)
Total Protein: 6.8 g/dL (ref 6.5–8.1)

## 2021-06-07 LAB — HEPARIN LEVEL (UNFRACTIONATED): Heparin Unfractionated: 0.21 IU/mL — ABNORMAL LOW (ref 0.30–0.70)

## 2021-06-07 LAB — TROPONIN I (HIGH SENSITIVITY)
Troponin I (High Sensitivity): 173 ng/L (ref <18)
Troponin I (High Sensitivity): 845 ng/L (ref <18)

## 2021-06-07 LAB — RESP PANEL BY RT-PCR (FLU A&B, COVID) ARPGX2
Influenza A by PCR: NEGATIVE
Influenza B by PCR: NEGATIVE
SARS Coronavirus 2 by RT PCR: NEGATIVE

## 2021-06-07 MED ORDER — INSULIN ASPART 100 UNIT/ML IJ SOLN
0.0000 [IU] | Freq: Three times a day (TID) | INTRAMUSCULAR | Status: DC
  Administered 2021-06-08: 3 [IU] via SUBCUTANEOUS
  Administered 2021-06-08: 2 [IU] via SUBCUTANEOUS
  Administered 2021-06-08 (×2): 3 [IU] via SUBCUTANEOUS
  Administered 2021-06-09: 2 [IU] via SUBCUTANEOUS
  Administered 2021-06-09: 1 [IU] via SUBCUTANEOUS
  Administered 2021-06-09: 2 [IU] via SUBCUTANEOUS
  Administered 2021-06-10: 5 [IU] via SUBCUTANEOUS
  Administered 2021-06-10: 3 [IU] via SUBCUTANEOUS
  Administered 2021-06-10 (×2): 2 [IU] via SUBCUTANEOUS

## 2021-06-07 MED ORDER — GABAPENTIN 100 MG PO CAPS
100.0000 mg | ORAL_CAPSULE | Freq: Two times a day (BID) | ORAL | Status: DC
  Administered 2021-06-07 – 2021-06-12 (×10): 100 mg via ORAL
  Filled 2021-06-07 (×10): qty 1

## 2021-06-07 MED ORDER — TAMSULOSIN HCL 0.4 MG PO CAPS
0.4000 mg | ORAL_CAPSULE | Freq: Every day | ORAL | Status: DC
  Administered 2021-06-08 – 2021-06-11 (×4): 0.4 mg via ORAL
  Filled 2021-06-07 (×4): qty 1

## 2021-06-07 MED ORDER — CLONIDINE HCL 0.2 MG PO TABS
0.3000 mg | ORAL_TABLET | Freq: Once | ORAL | Status: AC
  Administered 2021-06-07: 0.3 mg via ORAL
  Filled 2021-06-07: qty 1

## 2021-06-07 MED ORDER — ASPIRIN EC 81 MG PO TBEC
81.0000 mg | DELAYED_RELEASE_TABLET | Freq: Every day | ORAL | Status: DC
  Administered 2021-06-08 – 2021-06-12 (×5): 81 mg via ORAL
  Filled 2021-06-07 (×6): qty 1

## 2021-06-07 MED ORDER — ATORVASTATIN CALCIUM 80 MG PO TABS
80.0000 mg | ORAL_TABLET | Freq: Every day | ORAL | Status: DC
  Administered 2021-06-07 – 2021-06-12 (×6): 80 mg via ORAL
  Filled 2021-06-07: qty 2
  Filled 2021-06-07 (×5): qty 1

## 2021-06-07 MED ORDER — LIDOCAINE VISCOUS HCL 2 % MT SOLN
15.0000 mL | Freq: Once | OROMUCOSAL | Status: AC
  Administered 2021-06-07: 15 mL via ORAL
  Filled 2021-06-07: qty 15

## 2021-06-07 MED ORDER — ACETAMINOPHEN 325 MG PO TABS: 650.0000 mg | ORAL_TABLET | ORAL | Status: DC | PRN

## 2021-06-07 MED ORDER — HEPARIN BOLUS VIA INFUSION
4000.0000 [IU] | Freq: Once | INTRAVENOUS | Status: AC
  Administered 2021-06-07: 4000 [IU] via INTRAVENOUS

## 2021-06-07 MED ORDER — AMLODIPINE BESYLATE 5 MG PO TABS
10.0000 mg | ORAL_TABLET | Freq: Once | ORAL | Status: AC
  Administered 2021-06-07: 10 mg via ORAL
  Filled 2021-06-07: qty 2

## 2021-06-07 MED ORDER — ALUM & MAG HYDROXIDE-SIMETH 200-200-20 MG/5ML PO SUSP
30.0000 mL | Freq: Once | ORAL | Status: AC
  Administered 2021-06-07: 30 mL via ORAL
  Filled 2021-06-07: qty 30

## 2021-06-07 MED ORDER — NITROGLYCERIN IN D5W 200-5 MCG/ML-% IV SOLN
5.0000 ug/min | INTRAVENOUS | Status: DC
  Administered 2021-06-07: 5 ug/min via INTRAVENOUS
  Administered 2021-06-08: 100 ug/min via INTRAVENOUS
  Administered 2021-06-08: 145 ug/min via INTRAVENOUS
  Administered 2021-06-09: 150 ug/min via INTRAVENOUS
  Administered 2021-06-09 (×2): 75 ug/min via INTRAVENOUS
  Administered 2021-06-09: 150 ug/min via INTRAVENOUS
  Administered 2021-06-10: 200 ug/min via INTRAVENOUS
  Administered 2021-06-10: 120 ug/min via INTRAVENOUS
  Administered 2021-06-10: 165 ug/min via INTRAVENOUS
  Administered 2021-06-11: 130 ug/min via INTRAVENOUS
  Filled 2021-06-07 (×6): qty 250
  Filled 2021-06-07: qty 500
  Filled 2021-06-07 (×4): qty 250

## 2021-06-07 MED ORDER — ONDANSETRON HCL 4 MG/2ML IJ SOLN
4.0000 mg | Freq: Four times a day (QID) | INTRAMUSCULAR | Status: DC | PRN
  Administered 2021-06-08: 4 mg via INTRAVENOUS
  Filled 2021-06-07: qty 2

## 2021-06-07 MED ORDER — CARVEDILOL 6.25 MG PO TABS
6.2500 mg | ORAL_TABLET | Freq: Two times a day (BID) | ORAL | Status: DC
  Administered 2021-06-07 – 2021-06-09 (×4): 6.25 mg via ORAL
  Filled 2021-06-07 (×5): qty 1

## 2021-06-07 MED ORDER — HEPARIN (PORCINE) 25000 UT/250ML-% IV SOLN
1400.0000 [IU]/h | INTRAVENOUS | Status: DC
  Administered 2021-06-07: 1200 [IU]/h via INTRAVENOUS
  Filled 2021-06-07 (×2): qty 250

## 2021-06-07 NOTE — ED Provider Notes (Signed)
[facility] EMERGENCY DEPARTMENT Provider Note   CSN: [id number] Arrival date & time: 06/07/21  1215     History Chief Complaint  Patient presents with   Chest Pain    [name] is a 65 y.o. male.  HPI  Patient with significant medical history of [name], diabetes type 2, hypertension presents with chief complaint of left-sided chest pain.  Patient states chest pain started this morning, states that he got out of bed to use the restroom,  on his way back into bed he started to have this left-sided chest pain, it stayed on his left chest, will radiate down to his left arm, was not associate shortness of breath but does endorse feeling slightly clammy and having some nausea, he denies lightheaded or dizziness.  Patient's states the pain has stayed consistent, he went to urgent care today where they were concerned,  gave him nitroglycerin as well as aspirin, he states this helped with the pain, states the pain is now a 4.  He has no cardiac history, no history of PEs or DVTs, currently not on hormone therapy, recently seen by his cardiologist Dr. Warren Lacy who performed a myocardial perfusion test on 04/28/21 which was negative for signs of ischemia, feels chest pain is atypical and not cardiac related.  Patient has had chest pain like this in the past, he states this feels similar to his previous events, states the only difference is that it is a little bit longer than usual.  He does note that this morning he felt like he had a lot of gas in his stomach and had to belch a lot.  He does not endorse history of GERD, denies alleviating or aggravating factors.  Does not endorse fevers, chills, ABDOMINAL pain, worsening pedal edema.  Past Medical History:  Diagnosis Date   Chronic kidney disease    Complication of anesthesia    Hard to wake   Degenerative joint disease (DJD) of lumbar spine    Diabetes mellitus without complication ([place])    Type II   Essential hypertension  02/23/2017   Lumbar stenosis     Patient Active Problem List   Diagnosis Date Noted   Unstable angina ([place]) 06/07/2021   Precordial chest pain 04/07/2021   Personal history of diabetic foot ulcer 06/18/2020   History of amputation of great toe ([place]) 06/18/2020   Osteomyelitis of great toe of right foot ([place])    Diabetic ulcer of toe of right foot associated with diabetes mellitus due to underlying condition, with necrosis of bone ([place])    Osteomyelitis ([place]) 05/15/2020   AKI (acute kidney injury) ([place]) 05/15/2020   OSA (obstructive sleep apnea) 05/15/2020   Acute kidney failure, unspecified ([place]) 05/15/2020   Cellulitis of right foot 05/07/2020   Type 2 diabetes mellitus ([place]) 05/07/2020   Cellulitis of right lower extremity    Diabetic infection of right foot ([place])    Cellulitis of great toe, right    Diabetic ulcer of toe of right foot associated with type 2 diabetes mellitus, with fat layer exposed ([place]) 04/15/2020   OSA on CPAP 09/01/2018   S/P lumbar fusion 07/06/2018   DDD (degenerative disc disease), lumbar 02/28/2018   Hypertension associated with diabetes ([place]) 02/23/2017   Benign prostatic hyperplasia with incomplete bladder emptying 02/23/2017   Vitamin D deficiency 02/23/2017   Hyperlipidemia associated with type 2 diabetes mellitus ([place]) 02/23/2017   Type 2 diabetes mellitus with stage 2 chronic kidney disease, without long-term current use  of insulin ([place]) 02/23/2017   Hyperlipidemia, unspecified 02/23/2017   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    Past Surgical History:  Procedure Laterality Date   AMPUTATION TOE Right 05/17/2020   Procedure: AMPUTATION TOE partial right great toe;  Surgeon: Evelina Bucy, DPM;  Location: [facility];  Service: Podiatry;  Laterality: Right;   BELPHAROPTOSIS REPAIR     CARDIAC CATHETERIZATION     EYE SURGERY     HEMORRHOID SURGERY     skin cancer removed right ear     TRANSFORAMINAL LUMBAR INTERBODY FUSION (TLIF) WITH  PEDICLE SCREW FIXATION 2 LEVEL N/A 07/06/2018   Procedure: TRANSFORAMINAL LUMBAR INTERBODY FUSION (TLIF) L4-S1;  Surgeon: Melina Schools, MD;  Location: [facility];  Service: Orthopedics;  Laterality: N/A;       Family History  Problem Relation Age of Onset   Heart attack Father 43       Died suddenly   Hypertension Father    Vision loss Sister        one eye   Cancer Brother        skin   Diabetes Brother     Social History   Tobacco Use   Smoking status: Never   Smokeless tobacco: Never  Vaping Use   Vaping Use: Never used  Substance Use Topics   Alcohol use: Yes    Alcohol/week: 6.0 standard drinks    Types: 6 Cans of beer per week   Drug use: No    Home Medications Prior to Admission medications   Medication Sig Start Date End Date Taking? Authorizing Provider  amLODipine (NORVASC) 10 MG tablet Take 1 tablet (10 mg total) by mouth daily. 08/28/20  Yes Gottschalk, Leatrice Jewels M, DO  cloNIDine (CATAPRES) 0.3 MG tablet Take 1 tablet (0.3 mg total) by mouth 2 (two) times daily. 08/28/20  Yes Gottschalk, Leatrice Jewels M, DO  furosemide (LASIX) 20 MG tablet Take 20 mg by mouth daily. 06/02/21  Yes [provider]  gabapentin (NEURONTIN) 100 MG capsule Take 1 capsule (100 mg total) by mouth 2 (two) times daily. 03/30/21  Yes Gottschalk, Leatrice Jewels M, DO  losartan (COZAAR) 25 MG tablet Take 12.5 mg by mouth daily. 01/14/21 01/14/22 Yes [provider]  ondansetron (ZOFRAN-ODT) 8 MG disintegrating tablet Take by mouth. 06/07/21 06/07/21 Yes [provider]  tamsulosin (FLOMAX) 0.4 MG CAPS capsule Take 1 capsule (0.4 mg total) by mouth daily after supper. 09/29/20  Yes Gottschalk, Ashly M, DO  TRESIBA FLEXTOUCH 100 UNIT/ML FlexTouch Pen INJECT UP TO 36 UNITS EVERY MORNING. (MAY INCREASE BY 1 UNIT EVERY OTHER DAY TIL FBS BELOW 150) Patient taking differently: Inject 30 Units into the skin daily. 05/11/21  Yes Ronnie Doss M, DO  cholecalciferol (VITAMIN D3) 25 MCG (1000 UNIT)  tablet Take 2,000 Units by mouth daily. Patient not taking: Reported on 06/07/2021    [provider]  ferrous sulfate 325 (65 FE) MG tablet Take 325 mg by mouth daily with breakfast. Patient not taking: No sig reported    [provider]  Insulin Pen Needle (PEN NEEDLES) 31G X 5 MM MISC Use daily with insulin Dx E11.9 12/09/20   Ronnie Doss M, DO  loratadine (CLARITIN) 10 MG tablet Take 1 tablet (10 mg total) by mouth every other day. (renally dosed) Patient not taking: No sig reported 11/28/20   [name], DO  sildenafil (VIAGRA) 25 MG tablet Take 1-2 tablets (25-50 mg total) by mouth daily as needed for erectile dysfunction. Patient not  taking: No sig reported 06/18/20   Ronnie Doss M, DO  vitamin B-12 (CYANOCOBALAMIN) 500 MCG tablet Take 500 mcg by mouth daily. Patient not taking: No sig reported    [provider]    Allergies    Patient has no known allergies.  Review of Systems   Review of Systems  Constitutional:  Negative for chills and fever.  HENT:  Negative for congestion.   Respiratory:  Negative for shortness of breath.   Cardiovascular:  Positive for chest pain.  Gastrointestinal:  Positive for nausea. Negative for abdominal pain and vomiting.  Genitourinary:  Negative for enuresis.  Musculoskeletal:  Negative for back pain.  Skin:  Negative for rash.  Neurological:  Negative for dizziness and headaches.  Hematological:  Does not bruise/bleed easily.   Physical Exam Updated Vital Signs BP 130/64   Pulse 72   Temp 98.2 F (36.8 C) (Oral)   Resp 20   Ht 6' (1.829 m)   Wt 95.3 kg   SpO2 96%   BMI 28.48 kg/m   Physical Exam Vitals and nursing note reviewed.  Constitutional:      General: He is not in acute distress.    Appearance: He is not ill-appearing.  HENT:     Head: Normocephalic and atraumatic.     Nose: No congestion.  Eyes:     Conjunctiva/sclera: Conjunctivae normal.  Cardiovascular:     Rate and  Rhythm: Normal rate and regular rhythm.     Pulses: Normal pulses.     Heart sounds: No murmur heard.   No friction rub. No gallop.     Comments: Pain does not worsen with palpation of the chest Pulmonary:     Effort: No respiratory distress.     Breath sounds: No wheezing, rhonchi or rales.  Abdominal:     Palpations: Abdomen is soft.     Tenderness: There is no abdominal tenderness. There is no right CVA tenderness or left CVA tenderness.  Musculoskeletal:     Right lower leg: No edema.     Left lower leg: No edema.  Skin:    General: Skin is warm and dry.  Neurological:     Mental Status: He is alert.  Psychiatric:        Mood and Affect: Mood normal.    ED Results / Procedures / Treatments   Labs (all labs ordered are listed, but only abnormal results are displayed) Labs Reviewed  COMPREHENSIVE METABOLIC PANEL - Abnormal; Notable for the following components:      Result Value   Potassium 3.3 (*)    Glucose, Bld 238 (*)    BUN 25 (*)    Creatinine, Ser 1.86 (*)    GFR, Estimated 39 (*)    All other components within normal limits  CBC WITH DIFFERENTIAL/PLATELET - Abnormal; Notable for the following components:   WBC 11.4 (*)    HCT 37.9 (*)    Neutro Abs 8.3 (*)    All other components within normal limits  TROPONIN I (HIGH SENSITIVITY) - Abnormal; Notable for the following components:   Troponin I (High Sensitivity) 173 (*)    All other components within normal limits  TROPONIN I (HIGH SENSITIVITY) - Abnormal; Notable for the following components:   Troponin I (High Sensitivity) 845 (*)    All other components within normal limits  RESP PANEL BY RT-PCR (FLU A&B, COVID) [id number]  HEPARIN LEVEL (UNFRACTIONATED)  HEPARIN LEVEL (UNFRACTIONATED)  CBC    EKG EKG Interpretation  Date/Time:  Sunday June 07 2021 12:20:04 EDT Ventricular Rate:  84 PR Interval:  209 QRS Duration: 94 QT Interval:  387 QTC Calculation: 458 R Axis:   2 Text Interpretation: Sinus  rhythm Ventricular premature complex Borderline repolarization abnormality Since last tracing pvc new Otherwise no significant change Confirmed by Daleen Bo [phone]) on 06/07/2021 12:42:30 PM  Radiology DG Chest 2 View  Result Date: 06/07/2021 CLINICAL DATA:  Chest pain. EXAM: CHEST - 2 VIEW COMPARISON:  05/31/2018 FINDINGS: The heart size and mediastinal contours are within normal limits. There is no evidence of pulmonary edema, consolidation, pneumothorax, nodule or pleural fluid. The visualized skeletal structures are unremarkable. IMPRESSION: No active cardiopulmonary disease. Electronically Signed   By: Aletta Edouard M.D.   On: 06/07/2021 14:38    Procedures .Critical Care  Date/Time: 06/07/2021 9:03 PM Performed by: Marcello Fennel, PA-C Authorized by: Marcello Fennel, PA-C   Critical care provider statement:    Critical care time (minutes):  45   Critical care time was exclusive of:  Separately billable procedures and treating other patients   Critical care was necessary to treat or prevent imminent or life-threatening deterioration of the following conditions:  Cardiac failure   Critical care was time spent personally by me on the following activities:  Discussions with consultants, evaluation of patient's response to treatment, examination of patient, ordering and performing treatments and interventions, ordering and review of laboratory studies, ordering and review of radiographic studies, pulse oximetry, re-evaluation of patient's condition and review of old charts   I assumed direction of critical care for this patient from another provider in my specialty: no     Care discussed with: admitting provider     Medications Ordered in ED Medications  nitroGLYCERIN 50 mg in dextrose 5 % 250 mL (0.2 mg/mL) infusion (90 mcg/min Intravenous Rate/Dose Change 06/07/21 1926)  heparin bolus via infusion 4,000 Units (4,000 Units Intravenous Bolus from Bag 06/07/21 1457)    Followed  by  heparin ADULT infusion 100 units/mL (25000 units/243mL) (1,200 Units/hr Intravenous New Bag/Given 06/07/21 1457)  atorvastatin (LIPITOR) tablet 80 mg (80 mg Oral Given 06/07/21 1705)  alum & mag hydroxide-simeth (MAALOX/MYLANTA) 200-200-20 MG/5ML suspension 30 mL (30 mLs Oral Given 06/07/21 1350)    And  lidocaine (XYLOCAINE) 2 % viscous mouth solution 15 mL (15 mLs Oral Given 06/07/21 1350)  cloNIDine (CATAPRES) tablet 0.3 mg (0.3 mg Oral Given 06/07/21 1350)  amLODipine (NORVASC) tablet 10 mg (10 mg Oral Given 06/07/21 1350)    ED Course  I have reviewed the triage vital signs and the nursing notes.  Pertinent labs & imaging results that were available during my care of the patient were reviewed by me and considered in my medical decision making (see chart for details).    MDM Rules/Calculators/A&P                          Initial impression-patient presents with chest pain.  He is alert, does not appear in acute stress, vital signs on 4 hypertension, will obtain basic lab work-up, chest x-ray, EKG, patient did not take his hypertensive medication we will provide him his normal dose GI cocktail and reassess.  Work-up-CBC shows leukocytosis 11.4, CMP shows potassium of 3.3, hyperglycemia 238, creatinine 1.84, troponins 173, respiratory panel negative.  Chest x-ray unremarkable.  EKG sinus rhythm without signs of ischemia  Reassessment-patient has an elevated troponin of 137, I am concerned for angina/ACS, will consult with cardiology  for further recommendations.  Reassessed  vital signs have improved, blood pressure is now 357 systolic, patient states pain has dropped from a 4 down to a 1,  No other complaints at this time.  Second troponin was elevated at 845, patient has no complaints at this time, chest pain has completely resolved, blood pressure improved, Dr. Domenic Polite was notified of this he recommends a dose of Lipitor and continue with current treatment plan.  Patient was  reassessed prior to transfer has  no complaints this time, vital signs remained stable  Consult-spoke with Dr. Domenic Polite of cardiology, he recommends on the patient on a heparin drip, IV nitro to help with blood pressure as well as pain.  Patient was already given amlodipine and clonidine he recommends holding patient's losartan.  He would like patient transferred to [facility], he will admit the patient, he will be a stepdown patient.  Rule out-   Low suspicion for PE as patient denies pleuritic chest pain, shortness of breath, patient denies leg pain, no pedal edema noted on exam, vital signs are reassuring.  Low suspicion for AAA or aortic dissection as history is atypical, patient has low risk factors.  Low suspicion for systemic infection as patient is nontoxic-appearing, vital signs reassuring, no obvious source infection noted on exam.     Plan-cardiology admission for unstable angina/ACS patient rechecked her down to [facility].  Direct admit under Dr. Domenic Polite  Final Clinical Impression(s) / ED Diagnoses Final diagnoses:  Chest pain, unspecified type  Elevated troponin    Rx / DC Orders ED Discharge Orders     None        Aron Baba 06/07/21 2105    Daleen Bo, MD 06/08/21 [phone]

## 2021-06-07 NOTE — H&P (Signed)
Cardiology Admission History and Physical:   Patient ID: [name] MRN: [id number]; DOB: 03-19-1956   Admission date: 06/07/2021  PCP:  [name], DO   [facility] Providers Cardiologist:  Minus Breeding, MD        Chief Complaint:  chest pain  Patient Profile:   [name] is a 65 y.o. male with hypertension, type 2 diabetes, [name] who is being seen 06/08/2021 for the evaluation of chest pain.  History of Present Illness:   [name] is a 65 year old male with a past medical history significant for hypertension, type 2 diabetes, [name], hyperlipidemia who presented to outside emergency department with significant left-sided substernal chest pain that started this morning.  Noted that the pain was radiating down his left arm however was not associated with any shortness of breath.  He was having some diaphoresis, nausea, but no vomiting or lightheadedness.  Initially presented and received nitroglycerin along with an aspirin load which subsided his pain some.  Has had some typical angina pain over the last several months and has been followed by Dr. Lavada Mesi who had him undergo a myocardial perfusion stress test in June 2022 which was negative.  However he still been having some episodes and now is progressive.  Upon arrival to outside ED, he was having active chest pain and was started on a nitroglycerin infusion.  He was having escalated doses of his nitroglycerin in order to keep his chest pain at bay.  Initial set of troponins from 173->845 and 3 hours.  He was started on heparin for anticoagulation transferred here for further management.   Past Medical History:  Diagnosis Date   Chronic kidney disease    Complication of anesthesia    Hard to wake   Degenerative joint disease (DJD) of lumbar spine    Diabetes mellitus without complication ([place])    Type II   Essential hypertension 02/23/2017   Lumbar stenosis    NSTEMI (non-ST  elevated myocardial infarction) ([place]) 06/07/2021    Past Surgical History:  Procedure Laterality Date   AMPUTATION TOE Right 05/17/2020   Procedure: AMPUTATION TOE partial right great toe;  Surgeon: Evelina Bucy, DPM;  Location: [facility];  Service: Podiatry;  Laterality: Right;   BELPHAROPTOSIS REPAIR     CARDIAC CATHETERIZATION     EYE SURGERY     HEMORRHOID SURGERY     skin cancer removed right ear     TRANSFORAMINAL LUMBAR INTERBODY FUSION (TLIF) WITH PEDICLE SCREW FIXATION 2 LEVEL N/A 07/06/2018   Procedure: TRANSFORAMINAL LUMBAR INTERBODY FUSION (TLIF) L4-S1;  Surgeon: Melina Schools, MD;  Location: [place];  Service: Orthopedics;  Laterality: N/A;     Medications Prior to Admission: Prior to Admission medications   Medication Sig Start Date End Date Taking? Authorizing Provider  amLODipine (NORVASC) 10 MG tablet Take 1 tablet (10 mg total) by mouth daily. 08/28/20  Yes Gottschalk, Leatrice Jewels M, DO  cloNIDine (CATAPRES) 0.3 MG tablet Take 1 tablet (0.3 mg total) by mouth 2 (two) times daily. 08/28/20  Yes Gottschalk, Leatrice Jewels M, DO  furosemide (LASIX) 20 MG tablet Take 20 mg by mouth daily. 06/02/21  Yes [provider]  gabapentin (NEURONTIN) 100 MG capsule Take 1 capsule (100 mg total) by mouth 2 (two) times daily. 03/30/21  Yes Gottschalk, Leatrice Jewels M, DO  losartan (COZAAR) 25 MG tablet Take 12.5 mg by mouth daily. 01/14/21 01/14/22 Yes [provider]  ondansetron (ZOFRAN-ODT) 8 MG disintegrating tablet Take by  mouth. 06/07/21 06/07/21 Yes [provider]  tamsulosin (FLOMAX) 0.4 MG CAPS capsule Take 1 capsule (0.4 mg total) by mouth daily after supper. 09/29/20  Yes Gottschalk, Ashly M, DO  TRESIBA FLEXTOUCH 100 UNIT/ML FlexTouch Pen INJECT UP TO 36 UNITS EVERY MORNING. (MAY INCREASE BY 1 UNIT EVERY OTHER DAY TIL FBS BELOW 150) Patient taking differently: Inject 30 Units into the skin daily. 05/11/21  Yes Ronnie Doss M, DO  cholecalciferol (VITAMIN D3) 25 MCG (1000 UNIT)  tablet Take 2,000 Units by mouth daily. Patient not taking: Reported on 06/07/2021    [provider]  ferrous sulfate 325 (65 FE) MG tablet Take 325 mg by mouth daily with breakfast. Patient not taking: No sig reported    [provider]  Insulin Pen Needle (PEN NEEDLES) 31G X 5 MM MISC Use daily with insulin Dx E11.9 12/09/20   Ronnie Doss M, DO  loratadine (CLARITIN) 10 MG tablet Take 1 tablet (10 mg total) by mouth every other day. (renally dosed) Patient not taking: No sig reported 11/28/20   [name], DO  sildenafil (VIAGRA) 25 MG tablet Take 1-2 tablets (25-50 mg total) by mouth daily as needed for erectile dysfunction. Patient not taking: No sig reported 06/18/20   [name], DO  vitamin B-12 (CYANOCOBALAMIN) 500 MCG tablet Take 500 mcg by mouth daily. Patient not taking: No sig reported    [provider]     Allergies:   No Known Allergies  Social History:   Social History   Socioeconomic History   Marital status: Married    Spouse name: Not on file   Number of children: 3   Years of education: Not on file   Highest education level: Not on file  Occupational History   Not on file  Tobacco Use   Smoking status: Never   Smokeless tobacco: Never  Vaping Use   Vaping Use: Never used  Substance and Sexual Activity   Alcohol use: Yes    Alcohol/week: 6.0 standard drinks    Types: 6 Cans of beer per week   Drug use: No   Sexual activity: Yes  Other Topics Concern   Not on file  Social History Narrative   Lives with his wife - Has a son with cirrhosis who he stays with a lot. Has another son and one daughter    Social Determinants of Radio broadcast assistant Strain: Low Risk    Difficulty of Paying Living Expenses: Not hard at all  Food Insecurity: No Food Insecurity   Worried About Charity fundraiser in the Last Year: Never true   Arboriculturist in the Last Year: Never true  Transportation Needs: No  Transportation Needs   Lack of Transportation (Medical): No   Lack of Transportation (Non-Medical): No  Physical Activity: Sufficiently Active   Days of Exercise per Week: 7 days   Minutes of Exercise per Session: 60 min  Stress: No Stress Concern Present   Feeling of Stress : Not at all  Social Connections: Moderately Isolated   Frequency of Communication with Friends and Family: More than three times a week   Frequency of Social Gatherings with Friends and Family: More than three times a week   Attends Religious Services: Never   Marine scientist or Organizations: No   Attends Archivist Meetings: Never   Marital Status: Married  Human resources officer Violence: Not At Risk   Fear of Current or Ex-Partner:  No   Emotionally Abused: No   Physically Abused: No   Sexually Abused: No    Family History:   The patient's family history includes Cancer in his brother; Diabetes in his brother; Heart attack (age of onset: 10) in his father; Hypertension in his father; Vision loss in his sister.    ROS:  Please see the history of present illness.  All other ROS reviewed and negative.     Physical Exam/Data:   Vitals:   06/08/21 0630 06/08/21 0655 06/08/21 0700 06/08/21 0800  BP: (!) 146/73  (!) 155/74 (!) 145/74  Pulse: 67  71 69  Resp: 12  14 13   Temp:  98.4 F (36.9 C)    TempSrc:  Oral    SpO2: 96%  97% 96%  Weight:      Height:        Intake/Output Summary (Last 24 hours) at 06/08/2021 0953 Last data filed at 06/08/2021 0800 Gross per 24 hour  Intake 816.76 ml  Output 500 ml  Net 316.76 ml   Last 3 Weights 06/07/2021 04/08/2021 03/30/2021  Weight (lbs) 210 lb 205 lb 202 lb 6.4 oz  Weight (kg) 95.255 kg 92.987 kg 91.808 kg     Body mass index is 28.48 kg/m.  General:  Well nourished, well developed, in no acute distress. HEENT: normal Lymph: no adenopathy Neck: no JVD Endocrine:  No thryomegaly Vascular: No carotid bruits; FA pulses 2+ bilaterally without  bruits  Cardiac:  normal S1, S2; RRR; no murmur  Lungs:  clear to auscultation bilaterally, no wheezing, rhonchi or rales  Abd: soft, nontender, no hepatomegaly  Ext: no edema Musculoskeletal:  No deformities, BUE and BLE strength normal and equal Skin: warm and dry  Neuro:  CNs 2-12 intact, no focal abnormalities noted Psych:  Normal affect    EKG:  The ECG that was done  was personally reviewed and demonstrates ECG on my read with nonspecific ST-T wave changes.  No ST elevation  Relevant CV Studies: none  Laboratory Data:  High Sensitivity Troponin:   Recent Labs  Lab 06/07/21 1225 06/07/21 1535 06/08/21 0725  TROPONINIHS 173* 845* 10,056*      Chemistry Recent Labs  Lab 06/08/21 0104 06/08/21 0725  NA 136 135  K 3.3* 3.3*  CL 105 104  CO2 24 25  GLUCOSE 219* 218*  BUN 22 25*  CREATININE 2.19* 2.23*  CALCIUM 8.6* 8.6*  GFRNONAA 32* 32*  ANIONGAP 7 6    Recent Labs  Lab 06/07/21 1225  PROT 6.8  ALBUMIN 3.7  AST 22  ALT 15  ALKPHOS 93  BILITOT 0.7   Hematology Recent Labs  Lab 06/07/21 1225 06/08/21 0104  WBC 11.4* 12.4*  RBC 4.52 3.81*  HGB 13.5 11.1*  HCT 37.9* 31.8*  MCV 83.8 83.5  MCH 29.9 29.1  MCHC 35.6 34.9  RDW 13.6 13.6  PLT 212 215   BNPNo results for input(s): BNP, PROBNP in the last 168 hours.  DDimer No results for input(s): DDIMER in the last 168 hours.   Radiology/Studies:  DG Chest 2 View  Result Date: 06/07/2021 CLINICAL DATA:  Chest pain. EXAM: CHEST - 2 VIEW COMPARISON:  05/31/2018 FINDINGS: The heart size and mediastinal contours are within normal limits. There is no evidence of pulmonary edema, consolidation, pneumothorax, nodule or pleural fluid. The visualized skeletal structures are unremarkable. IMPRESSION: No active cardiopulmonary disease. Electronically Signed   By: Aletta Edouard M.D.   On: 06/07/2021 14:38  Assessment and Plan:   NSTEMI Continue aspirin 81 mg daily and atorvastatin 80 mg daily Continue  heparin GTT for anticoagulation Continue nitro drip for chest pain Initiate beta-blocker therapy with carvedilol 6.25 mg twice daily.  Can increase the dose as tolerated. Holding ARB precath Will order an echo to assess LV function N.p.o. midnight for left heart catheterization tomorrow HTN Amlodipine and clonidine administered at outside emergency department.  We will hold further dosing for now.  Continue to titrate nitroglycerin and will initiate carvedilol therapy.  Holding ARB precatheterization.  We will need to monitor blood pressure closely. T2DM Ssi and checks ac/hs Hypokalemia: We will repeat and replete as indicated Stage III CKD: We will get nephrology consultation.   Risk Assessment/Risk Scores:     TIMI Risk Score for Unstable Angina or Non-ST Elevation MI:   The patient's TIMI risk score is 5, which indicates a 26% risk of all cause mortality, new or recurrent myocardial infarction or need for urgent revascularization in the next 14 days.       Severity of Illness: The appropriate patient status for this patient is INPATIENT. Inpatient status is judged to be reasonable and necessary in order to provide the required intensity of service to ensure the patient's safety. The patient's presenting symptoms, physical exam findings, and initial radiographic and laboratory data in the context of their chronic comorbidities is felt to place them at high risk for further clinical deterioration. Furthermore, it is not anticipated that the patient will be medically stable for discharge from the hospital within 2 midnights of admission. The following factors support the patient status of inpatient.   " The patient's presenting symptoms include chest pain. " The worrisome physical exam findings include chest pain, HTN on nitro gtt. " The initial radiographic and laboratory data are worrisome because of elevated troponin. " The chronic co-morbidities include htn, ckd, t2dm.   * I  certify that at the point of admission it is my clinical judgment that the patient will require inpatient hospital care spanning beyond 2 midnights from the point of admission due to high intensity of service, high risk for further deterioration and high frequency of surveillance required.*   For questions or updates, please contact [place] Please consult [email or web address] for contact info under     Signed, Sinclair Grooms, MD  06/08/2021 9:53 AM

## 2021-06-07 NOTE — Progress Notes (Signed)
   Progress Note  Patient Name: [name] Date of Encounter: 06/07/2021  Primary Cardiologist: Minus Breeding, MD  Received phone update from Mr. Ileene Patrick PA-C regarding follow-up high-sensitivity troponin I up to 845.  This is consistent with NSTEMI.  Patient reported to be pain-free at this point on IV heparin and IV nitroglycerin.  Blood pressure also improved.  He has received aspirin, Norvasc, and clonidine today.  I also recommended giving him Lipitor 80 mg dose today.  Transfer to [facility] is pending bed availability, telemetry now being considered since he is pain-free.  Signed, Rozann Lesches, MD  06/07/2021, 4:46 PM

## 2021-06-07 NOTE — ED Triage Notes (Addendum)
Pt with left sided CP since waking up this morning. Pt seen at urgent Care in [facility] and sent here. Hypertensive and has DM . Nausea and received 8mg  Zofran at [facility]. No nausea at present. Pt also received ASA and total of 2 SL NTG en route.

## 2021-06-07 NOTE — ED Notes (Signed)
ED TO INPATIENT HANDOFF REPORT  ED Nurse Name and Phone #:  [phone]  S Name/Age/Gender [name] 65 y.o. male Room/Bed: APA08/APA08  Code Status   Code Status: Prior  Home/SNF/Other Home Patient oriented to: self, place, time and situation Is this baseline? Yes   Triage Complete: Triage complete  Chief Complaint Unstable angina ([place]) [I20.0]  Triage Note Pt with left sided CP since waking up this morning. Pt seen at urgent Care in [facility] and sent here. Hypertensive and has DM . Nausea and received 8mg  Zofran at [facility]. No nausea at present. Pt also received ASA and total of 2 SL NTG en route.     Allergies No Known Allergies  Level of Care/Admitting Diagnosis ED Disposition    ED Disposition  Admit   Condition  --   Comment  Hospital Area: [place] [[id number]]  Level of Care: Progressive [102]  Admit to Progressive based on following criteria: CARDIOVASCULAR & THORACIC of moderate stability with acute coronary syndrome symptoms/low risk myocardial infarction/hypertensive urgency/arrhythmias/heart failure potentially compromising stability and stable post cardiovascular intervention patients.  May admit patient to [facility] or [facility] if equivalent level of care is available:: No  Interfacility transfer: Yes  Covid Evaluation: Asymptomatic Screening Protocol (No Symptoms)  Diagnosis: Unstable angina [facility]) [[id number]]  Admitting Physician: Satira Sark [[id number]]  Attending Physician: Satira Sark [[id number]]  Estimated length of stay: past midnight tomorrow  Certification:: I certify this patient will need inpatient services for at least 2 midnights         B Medical/Surgery History Past Medical History:  Diagnosis Date  . Chronic kidney disease   . Complication of anesthesia    Hard to wake  . Degenerative joint disease (DJD) of lumbar spine   . Diabetes mellitus without complication ([place])    Type II  . Essential  hypertension 02/23/2017  . Lumbar stenosis    Past Surgical History:  Procedure Laterality Date  . AMPUTATION TOE Right 05/17/2020   Procedure: AMPUTATION TOE partial right great toe;  Surgeon: Evelina Bucy, DPM;  Location[facility];  Service: Podiatry;  Laterality: Right;  . BELPHAROPTOSIS REPAIR    . CARDIAC CATHETERIZATION    . EYE SURGERY    . HEMORRHOID SURGERY    . skin cancer removed right ear    . TRANSFORAMINAL LUMBAR INTERBODY FUSION (TLIF) WITH PEDICLE SCREW FIXATION 2 LEVEL N/A 07/06/2018   Procedure: TRANSFORAMINAL LUMBAR INTERBODY FUSION (TLIF) L4-S1;  Surgeon: Melina Schools, MD;  Location: [place];  Service: Orthopedics;  Laterality: N/A;     A IV Location/Drains/Wounds Patient Lines/Drains/Airways Status    Active Line/Drains/Airways    Name Placement date Placement time Site Days   Peripheral IV 06/07/21 20 G Right Hand 06/07/21  1439  Hand  less than 1   Peripheral IV 06/07/21 18 G Left Antecubital 06/07/21  1316  Antecubital  less than 1   Incision (Closed) 05/17/20 Foot 05/17/20  0906  -- 386          Intake/Output Last 24 hours  Intake/Output Summary (Last 24 hours) at 06/07/2021 1831 Last data filed at 06/07/2021 1813 Gross per 24 hour  Intake 50.22 ml  Output --  Net 50.22 ml    Labs/Imaging Results for orders placed or performed during the hospital encounter of 06/07/21 (from the past 48 hour(s))  Comprehensive metabolic panel     Status: Abnormal   Collection Time: 06/07/21 12:25 PM  Result Value Ref  Range   Sodium 136 135 - 145 mmol/L   Potassium 3.3 (L) 3.5 - 5.1 mmol/L   Chloride 105 98 - 111 mmol/L   CO2 22 22 - 32 mmol/L   Glucose, Bld 238 (H) 70 - 99 mg/dL    Comment: Glucose reference range applies only to samples taken after fasting for at least 8 hours.   BUN 25 (H) 8 - 23 mg/dL   Creatinine, Ser 1.86 (H) 0.61 - 1.24 mg/dL   Calcium 8.9 8.9 - 10.3 mg/dL   Total Protein 6.8 6.5 - 8.1 g/dL   Albumin 3.7 3.5 - 5.0 g/dL   AST 22 15 - 41  U/L   ALT 15 0 - 44 U/L   Alkaline Phosphatase 93 38 - 126 U/L   Total Bilirubin 0.7 0.3 - 1.2 mg/dL   GFR, Estimated 39 (L) >60 mL/min    Comment: (NOTE) Calculated using the CKD-EPI Creatinine Equation (2021)    Anion gap 9 5 - 15    Comment: Performed at [facility], [address] [place], [place] [address]  CBC with Differential     Status: Abnormal   Collection Time: 06/07/21 12:25 PM  Result Value Ref Range   WBC 11.4 (H) 4.0 - 10.5 K/uL   RBC 4.52 4.22 - 5.81 MIL/uL   Hemoglobin 13.5 13.0 - 17.0 g/dL   HCT 37.9 (L) 39.0 - 52.0 %   MCV 83.8 80.0 - 100.0 fL   MCH 29.9 26.0 - 34.0 pg   MCHC 35.6 30.0 - 36.0 g/dL   RDW 13.6 11.5 - 15.5 %   Platelets 212 150 - 400 K/uL   nRBC 0.0 0.0 - 0.2 %   Neutrophils Relative % 72 %   Neutro Abs 8.3 (H) 1.7 - 7.7 K/uL   Lymphocytes Relative 22 %   Lymphs Abs 2.5 0.7 - 4.0 K/uL   Monocytes Relative 4 %   Monocytes Absolute 0.5 0.1 - 1.0 K/uL   Eosinophils Relative 1 %   Eosinophils Absolute 0.1 0.0 - 0.5 K/uL   Basophils Relative 0 %   Basophils Absolute 0.1 0.0 - 0.1 K/uL   Immature Granulocytes 1 %   Abs Immature Granulocytes 0.06 0.00 - 0.07 K/uL    Comment: Performed at [facility], [address] [place], [place] [address]  Troponin I (High Sensitivity)     Status: Abnormal   Collection Time: 06/07/21 12:25 PM  Result Value Ref Range   Troponin I (High Sensitivity) 173 (HH) <18 ng/L    Comment: CRITICAL RESULT CALLED TO, READ BACK BY AND VERIFIED WITH:  GANT,E @1409  ON 06/10/21 BY STEPHTR (NOTE) Elevated high sensitivity troponin I (hsTnI) values and significant  changes across serial measurements may suggest ACS but many other  chronic and acute conditions are known to elevate hsTnI results.  Refer to the Links section for chest pain algorithms and additional  guidance. Performed at [facility], [address], [place], [place] [address]   Resp Panel by RT-PCR (Flu A&B, Covid) Nasopharyngeal Swab     Status: None    Collection Time: 06/07/21  2:28 PM   Specimen: Nasopharyngeal Swab; Nasopharyngeal(NP) swabs in vial transport medium  Result Value Ref Range   SARS Coronavirus 2 by RT PCR NEGATIVE NEGATIVE    Comment: (NOTE) SARS-CoV-2 target nucleic acids are NOT DETECTED.  The SARS-CoV-2 RNA is generally detectable in upper respiratory specimens during the acute phase of infection. The lowest concentration of SARS-CoV-2 viral copies this assay can  detect is 138 copies/mL. A negative result does not preclude SARS-Cov-2 infection and should not be used as the sole basis for treatment or other patient management decisions. A negative result may occur with  improper specimen collection/handling, submission of specimen other than nasopharyngeal swab, presence of viral mutation(s) within the areas targeted by this assay, and inadequate number of viral copies(<138 copies/mL). A negative result must be combined with clinical observations, patient history, and epidemiological information. The expected result is Negative.  Fact Sheet for Patients:  EntrepreneurPulse.com.au  Fact Sheet for Healthcare Providers:  IncredibleEmployment.be  This test is no t yet approved or cleared by the [place] FDA and  has been authorized for detection and/or diagnosis of SARS-CoV-2 by FDA under an Emergency Use Authorization (EUA). This EUA will remain  in effect (meaning this test can be used) for the duration of the COVID-19 declaration under Section 564(b)(1) of the Act, 21 U.S.C.section 360bbb-3(b)(1), unless the authorization is terminated  or revoked sooner.       Influenza A by PCR NEGATIVE NEGATIVE   Influenza B by PCR NEGATIVE NEGATIVE    Comment: (NOTE) The Xpert Xpress SARS-CoV-2/FLU/RSV plus assay is intended as an aid in the diagnosis of influenza from Nasopharyngeal swab specimens and should not be used as a sole basis for treatment. Nasal washings  and aspirates are unacceptable for Xpert Xpress SARS-CoV-2/FLU/RSV testing.  Fact Sheet for Patients: EntrepreneurPulse.com.au  Fact Sheet for Healthcare Providers: IncredibleEmployment.be  This test is not yet approved or cleared by the [place] FDA and has been authorized for detection and/or diagnosis of SARS-CoV-2 by FDA under an Emergency Use Authorization (EUA). This EUA will remain in effect (meaning this test can be used) for the duration of the COVID-19 declaration under Section 564(b)(1) of the Act, 21 U.S.C. section 360bbb-3(b)(1), unless the authorization is terminated or revoked.  Performed at [facility], [address] [place], [place] [address]   Troponin I (High Sensitivity)     Status: Abnormal   Collection Time: 06/07/21  3:35 PM  Result Value Ref Range   Troponin I (High Sensitivity) 845 (HH) <18 ng/L    Comment: CRITICAL RESULT CALLED TO, READ BACK BY AND VERIFIED WITH: BARWFORD,H @ 1621 ON 06/07/21 BY STEPHTR (NOTE) Elevated high sensitivity troponin I (hsTnI) values and significant  changes across serial measurements may suggest ACS but many other  chronic and acute conditions are known to elevate hsTnI results.  Refer to the Links section for chest pain algorithms and additional  guidance. Performed at [facility], [address], [place], [place] [address]    DG Chest 2 View  Result Date: 06/07/2021 CLINICAL DATA:  Chest pain. EXAM: CHEST - 2 VIEW COMPARISON:  05/31/2018 FINDINGS: The heart size and mediastinal contours are within normal limits. There is no evidence of pulmonary edema, consolidation, pneumothorax, nodule or pleural fluid. The visualized skeletal structures are unremarkable. IMPRESSION: No active cardiopulmonary disease. Electronically Signed   By: Aletta Edouard M.D.   On: 06/07/2021 14:38    Pending Labs Unresulted Labs (From admission, onward)    Start     Ordered   06/08/21 0500  Heparin  level (unfractionated)  Daily,   R      06/07/21 1437   06/08/21 0500  CBC  Daily,   R      06/07/21 1437   06/07/21 2130  Heparin level (unfractionated)  ONCE - STAT,   STAT        06/07/21 1437  Vitals/Pain Today's Vitals   06/07/21 1755 06/07/21 1800 06/07/21 1805 06/07/21 1815  BP: 138/79 (!) 147/82 (!) 151/77 (!) 155/80  Pulse: 69 78 73 77  Resp: 10 14 12 14   Temp:      TempSrc:      SpO2: 97% 96% 97% 98%  Weight:      Height:      PainSc:        Isolation Precautions No active isolations  Medications Medications  nitroGLYCERIN 50 mg in dextrose 5 % 250 mL (0.2 mg/mL) infusion (75 mcg/min Intravenous Rate/Dose Change 06/07/21 1813)  heparin bolus via infusion 4,000 Units (4,000 Units Intravenous Bolus from Bag 06/07/21 1457)    Followed by  heparin ADULT infusion 100 units/mL (25000 units/244mL) (1,200 Units/hr Intravenous New Bag/Given 06/07/21 1457)  atorvastatin (LIPITOR) tablet 80 mg (80 mg Oral Given 06/07/21 1705)  alum & mag hydroxide-simeth (MAALOX/MYLANTA) 200-200-20 MG/5ML suspension 30 mL (30 mLs Oral Given 06/07/21 1350)    And  lidocaine (XYLOCAINE) 2 % viscous mouth solution 15 mL (15 mLs Oral Given 06/07/21 1350)  cloNIDine (CATAPRES) tablet 0.3 mg (0.3 mg Oral Given 06/07/21 1350)  amLODipine (NORVASC) tablet 10 mg (10 mg Oral Given 06/07/21 1350)    Mobility walks Low fall risk   Focused Assessments    R Recommendations: See Admitting Provider Note  Report given to:   Additional Notes:

## 2021-06-07 NOTE — Progress Notes (Signed)
ANTICOAGULATION CONSULT NOTE - Initial Consult  Pharmacy Consult for Heparin Indication: chest pain/ACS  No Known Allergies  Patient Measurements: Height: 6' (182.9 cm) Weight: 95.3 kg (210 lb) IBW/kg (Calculated) : 77.6 HEPARIN DW (KG): 95.3   Vital Signs: Temp: 98.2 F (36.8 C) (07/24 1220) Temp Source: Oral (07/24 1220) BP: 186/83 (07/24 1400) Pulse Rate: 68 (07/24 1400)  Labs: Recent Labs    06/07/21 1225  HGB 13.5  HCT 37.9*  PLT 212  CREATININE 1.86*  TROPONINIHS 173*    Estimated Creatinine Clearance: 46.2 mL/min (A) (by C-G formula based on SCr of 1.86 mg/dL (H)).   Medical History: Past Medical History:  Diagnosis Date   Chronic kidney disease    Complication of anesthesia    Hard to wake   Degenerative joint disease (DJD) of lumbar spine    Diabetes mellitus without complication ([place])    Type II   Essential hypertension 02/23/2017   Lumbar stenosis     Medications:  See med rec  Assessment: Patient presented to ED with left sided chest pain and radiating down his arm. Troponins elevated. Pharmacy asked to start heparin. Reviewed home meds and is not on oral anticoagulants  Goal of Therapy:  Heparin level 0.3-0.7 units/ml Monitor platelets by anticoagulation protocol: Yes   Plan:  Give 4000 units bolus x 1 Start heparin infusion at 1200 units/hr Check anti-Xa level in ~6-8  hours and daily while on heparin Continue to monitor H&H and platelets  Isac Sarna, BS [place]ena [place], BCPS Clinical Pharmacist Pager [phone] 06/07/2021,2:32 PM

## 2021-06-07 NOTE — Progress Notes (Signed)
   Progress Note  Patient Name: [name] Date of Encounter: 06/07/2021  Primary Cardiologist: Minus Breeding, MD  Case discussed through [place] with Mr. [name].  Mr. [name] presents with recurrent chest pain, he had been evaluated by Dr. Percival Spanish back in May for possible ischemic heart disease and underwent a Lexiscan Myoview in June that was low risk and negative for ischemia with normal LVEF.  Recent episode of chest pain more prolonged, also radiating to the left arm with diaphoresis and nausea. Baseline history includes CKD stage IIIb followed by Dr. [name], also type 2 diabetes mellitus and hypertension.  Patient hypertensive at presentation, heart rate in the 60s in sinus rhythm.  Did not take his antihypertensive medications in the morning.  ECG shows sinus rhythm with inferior ST segment depression that is new in comparison to his last tracing.  Pertinent lab work includes high-sensitivity troponin I 173, BUN 25, creatinine 1.86, RBC 11.4, hemoglobin 13.5, platelets 212.  Chest x-ray reports no acute process.  Symptoms concerning for unstable angina/NSTEMI despite reassuring cardiac testing from June.  I have recommended initiation of IV heparin and IV nitroglycerin at [facility], he also received his home dose of Norvasc and clonidine.  Would not give Cozaar at this time in anticipation of angiography.  Continue aspirin and start high-dose statin Lipitor 80 mg daily.  Transfer being arranged to [facility] on the cardiology service, stepdown bed requested since patient still having some chest discomfort.  If his symptoms resolve could consider telemetry bed.  Anticipate hydration and ultimately a diagnostic cardiac catheterization.  Signed, Rozann Lesches, MD  06/07/2021, 3:07 PM

## 2021-06-07 NOTE — ED Provider Notes (Signed)
  Face-to-face evaluation   History: He presents for evaluation of chest discomfort which he noticed upon awakening this morning  He saw cardiology last month and had a nuclear medicine study which was very low risk.  Patient was taken  Left upper anterior chest discomfort Covid-19 increased pain.  He had his hemoglobin fell on 6/10.  He reports no significant change with nitroglycerin treatment.  He denies fever, chills, diaphoresis or weakness.  He states he has had similar pain to this previously, and a second Opinion on his cardiologist evaluated for.  Physical exam: Alert, calm, cooperative.  No respiratory distress.  Abdomen soft nontender palpation.  Chest is nontender to palpation.  Medical screening examination/treatment/procedure(s) were conducted as a shared visit with non-physician practitioner(s) and myself.  I personally evaluated the patient during the encounter    Daleen Bo, MD 06/08/21 [phone]

## 2021-06-07 NOTE — ED Notes (Signed)
Spouse updated on pt bed status

## 2021-06-07 NOTE — ED Notes (Signed)
Pt c/o chest discomfort; EDP made aware and will re-assess

## 2021-06-07 NOTE — Progress Notes (Signed)
[place] for Heparin Indication: chest pain/ACS  Labs: Recent Labs    06/07/21 1225 06/07/21 1535 06/07/21 2035  HGB 13.5  --   --   HCT 37.9*  --   --   PLT 212  --   --   HEPARINUNFRC  --   --  0.21*  CREATININE 1.86*  --   --   TROPONINIHS 173* 845*  --     Medications:  See med rec  Assessment: Patient presented to ED with left sided chest pain and radiating down his arm. Troponins elevated. Pharmacy asked to start heparin. Reviewed home meds and is not on oral anticoagulants Initial heparin level 0.21 units/ml  Goal of Therapy:  Heparin level 0.3-0.7 units/ml Monitor platelets by anticoagulation protocol: Yes   Plan:  Increase heparin to 1400 units/hr Check heparin level in 6-8 hours and daily Monitor for bleeding complications  Thanks for allowing pharmacy to be a part of this patient's care.  Excell Seltzer, PharmD Clinical Pharmacist 06/07/2021,10:31 PM

## 2021-06-07 NOTE — ED Notes (Signed)
Date and time results received: 06/07/21 1625  Test: troponin Critical Value: 845  Name of Provider Notified: Ileene Patrick  Orders Received? Or Actions Taken?: n/a

## 2021-06-08 ENCOUNTER — Inpatient Hospital Stay (HOSPITAL_COMMUNITY): Payer: PPO

## 2021-06-08 DIAGNOSIS — Z006 Encounter for examination for normal comparison and control in clinical research program: Secondary | ICD-10-CM

## 2021-06-08 DIAGNOSIS — I214 Non-ST elevation (NSTEMI) myocardial infarction: Secondary | ICD-10-CM

## 2021-06-08 DIAGNOSIS — E118 Type 2 diabetes mellitus with unspecified complications: Secondary | ICD-10-CM

## 2021-06-08 DIAGNOSIS — N183 Chronic kidney disease, stage 3 unspecified: Secondary | ICD-10-CM

## 2021-06-08 DIAGNOSIS — I10 Essential (primary) hypertension: Secondary | ICD-10-CM

## 2021-06-08 DIAGNOSIS — E1122 Type 2 diabetes mellitus with diabetic chronic kidney disease: Secondary | ICD-10-CM

## 2021-06-08 DIAGNOSIS — Z794 Long term (current) use of insulin: Secondary | ICD-10-CM

## 2021-06-08 DIAGNOSIS — E782 Mixed hyperlipidemia: Secondary | ICD-10-CM

## 2021-06-08 LAB — ECHOCARDIOGRAM COMPLETE
Area-P 1/2: 3.74 cm2
Calc EF: 53.6 %
Height: 72 in
MV VTI: 1.54 cm2
S' Lateral: 3.3 cm
Single Plane A2C EF: 56.6 %
Single Plane A4C EF: 56 %
Weight: 3360 oz

## 2021-06-08 LAB — CBC
HCT: 31.8 % — ABNORMAL LOW (ref 39.0–52.0)
Hemoglobin: 11.1 g/dL — ABNORMAL LOW (ref 13.0–17.0)
MCH: 29.1 pg (ref 26.0–34.0)
MCHC: 34.9 g/dL (ref 30.0–36.0)
MCV: 83.5 fL (ref 80.0–100.0)
Platelets: 215 10*3/uL (ref 150–400)
RBC: 3.81 MIL/uL — ABNORMAL LOW (ref 4.22–5.81)
RDW: 13.6 % (ref 11.5–15.5)
WBC: 12.4 10*3/uL — ABNORMAL HIGH (ref 4.0–10.5)
nRBC: 0 % (ref 0.0–0.2)

## 2021-06-08 LAB — BASIC METABOLIC PANEL
Anion gap: 7 (ref 5–15)
Anion gap: 6 (ref 5–15)
BUN: 22 mg/dL (ref 8–23)
BUN: 25 mg/dL — ABNORMAL HIGH (ref 8–23)
CO2: 24 mmol/L (ref 22–32)
CO2: 25 mmol/L (ref 22–32)
Calcium: 8.6 mg/dL — ABNORMAL LOW (ref 8.9–10.3)
Calcium: 8.6 mg/dL — ABNORMAL LOW (ref 8.9–10.3)
Chloride: 105 mmol/L (ref 98–111)
Chloride: 104 mmol/L (ref 98–111)
Creatinine, Ser: 2.19 mg/dL — ABNORMAL HIGH (ref 0.61–1.24)
Creatinine, Ser: 2.23 mg/dL — ABNORMAL HIGH (ref 0.61–1.24)
GFR, Estimated: 32 mL/min — ABNORMAL LOW (ref >60)
GFR, Estimated: 32 mL/min — ABNORMAL LOW (ref >60)
Glucose, Bld: 219 mg/dL — ABNORMAL HIGH (ref 70–99)
Glucose, Bld: 218 mg/dL — ABNORMAL HIGH (ref 70–99)
Potassium: 3.3 mmol/L — ABNORMAL LOW (ref 3.5–5.1)
Potassium: 3.3 mmol/L — ABNORMAL LOW (ref 3.5–5.1)
Sodium: 136 mmol/L (ref 135–145)
Sodium: 135 mmol/L (ref 135–145)

## 2021-06-08 LAB — HIV ANTIBODY (ROUTINE TESTING W REFLEX): HIV Screen 4th Generation wRfx: NONREACTIVE

## 2021-06-08 LAB — LIPID PANEL
Cholesterol: 160 mg/dL (ref 0–200)
HDL: 34 mg/dL — ABNORMAL LOW (ref >40)
LDL Cholesterol: 102 mg/dL — ABNORMAL HIGH (ref 0–99)
Total CHOL/HDL Ratio: 4.7 RATIO
Triglycerides: 120 mg/dL (ref <150)
VLDL: 24 mg/dL (ref 0–40)

## 2021-06-08 LAB — GLUCOSE, CAPILLARY
Glucose-Capillary: 228 mg/dL — ABNORMAL HIGH (ref 70–99)
Glucose-Capillary: 211 mg/dL — ABNORMAL HIGH (ref 70–99)
Glucose-Capillary: 221 mg/dL — ABNORMAL HIGH (ref 70–99)
Glucose-Capillary: 171 mg/dL — ABNORMAL HIGH (ref 70–99)

## 2021-06-08 LAB — BRAIN NATRIURETIC PEPTIDE: B Natriuretic Peptide: 217.1 pg/mL — ABNORMAL HIGH (ref 0.0–100.0)

## 2021-06-08 LAB — MRSA NEXT GEN BY PCR, NASAL: MRSA by PCR Next Gen: NOT DETECTED

## 2021-06-08 LAB — PROTIME-INR
INR: 1.1 (ref 0.8–1.2)
Prothrombin Time: 14.1 seconds (ref 11.4–15.2)

## 2021-06-08 LAB — TROPONIN I (HIGH SENSITIVITY)
Troponin I (High Sensitivity): 10056 ng/L (ref <18)
Troponin I (High Sensitivity): 8206 ng/L (ref <18)

## 2021-06-08 LAB — HEMOGLOBIN A1C
Hgb A1c MFr Bld: 6.5 % — ABNORMAL HIGH (ref 4.8–5.6)
Mean Plasma Glucose: 140 mg/dL

## 2021-06-08 LAB — HEPARIN LEVEL (UNFRACTIONATED)
Heparin Unfractionated: 0.13 IU/mL — ABNORMAL LOW (ref 0.30–0.70)
Heparin Unfractionated: 0.3 IU/mL (ref 0.30–0.70)

## 2021-06-08 MED ORDER — HEPARIN (PORCINE) 25000 UT/250ML-% IV SOLN
1800.0000 [IU]/h | INTRAVENOUS | Status: DC
  Administered 2021-06-08: 1800 [IU]/h via INTRAVENOUS
  Administered 2021-06-08: 1700 [IU]/h via INTRAVENOUS
  Filled 2021-06-08: qty 250

## 2021-06-08 MED ORDER — CHLORHEXIDINE GLUCONATE CLOTH 2 % EX PADS
6.0000 | MEDICATED_PAD | Freq: Every day | CUTANEOUS | Status: DC
  Administered 2021-06-08 – 2021-06-12 (×5): 6 via TOPICAL

## 2021-06-08 MED ORDER — SODIUM CHLORIDE 0.9% FLUSH
3.0000 mL | Freq: Two times a day (BID) | INTRAVENOUS | Status: DC
  Administered 2021-06-08 – 2021-06-11 (×3): 3 mL via INTRAVENOUS

## 2021-06-08 MED ORDER — POTASSIUM CHLORIDE CRYS ER 20 MEQ PO TBCR
40.0000 meq | EXTENDED_RELEASE_TABLET | Freq: Once | ORAL | Status: AC
  Administered 2021-06-08: 40 meq via ORAL
  Filled 2021-06-08: qty 2

## 2021-06-08 MED ORDER — HEPARIN BOLUS VIA INFUSION
2000.0000 [IU] | Freq: Once | INTRAVENOUS | Status: AC
  Administered 2021-06-08: 2000 [IU] via INTRAVENOUS
  Filled 2021-06-08: qty 2000

## 2021-06-08 MED ORDER — SODIUM CHLORIDE 0.9 % WEIGHT BASED INFUSION
3.0000 mL/kg/h | INTRAVENOUS | Status: AC
  Administered 2021-06-08: 3 mL/kg/h via INTRAVENOUS

## 2021-06-08 MED ORDER — PERFLUTREN LIPID MICROSPHERE
1.0000 mL | INTRAVENOUS | Status: AC | PRN
  Administered 2021-06-08: 2 mL via INTRAVENOUS
  Filled 2021-06-08: qty 10

## 2021-06-08 MED ORDER — FUROSEMIDE 10 MG/ML IJ SOLN: 20.0000 mg | Freq: Once | INTRAMUSCULAR | Status: DC

## 2021-06-08 MED ORDER — SODIUM CHLORIDE 0.9 % WEIGHT BASED INFUSION
1.0000 mL/kg/h | INTRAVENOUS | Status: DC
  Administered 2021-06-08 – 2021-06-09 (×3): 1 mL/kg/h via INTRAVENOUS

## 2021-06-08 NOTE — Research (Signed)
IDENTIFY Informed Consent                  Subject Name: [name]    Subject met inclusion and exclusion criteria.  The informed consent form, study requirements and expectations were reviewed with the subject and questions and concerns were addressed prior to the signing of the consent form.  The subject verbalized understanding of the trial requirements.  The subject agreed to participate in the IDENTIFY trial and signed the informed consent at 13:56PM on 06/08/21.  The informed consent was obtained prior to performance of any protocol-specific procedures for the subject.  A copy of the signed informed consent was given to the subject and a copy was placed in the subject's medical record.    Ledon Snare , Research Assistant

## 2021-06-08 NOTE — Consult Note (Signed)
Reason for Consult: Renal Failure Referring Physician: Dr. Marcelle Smiling  Chief Complaint: Chest pain  Assessment/Plan: Renal failure with acute on CKD3 followed by [facility] with h/o AKI 05/2020 which appears to have been secondary to an Abx being given for a toe infection and he was tole 3 weeks ago that his renal function had improved. His BL Cr appears to be in the 1.7-2 range and I attempted to call  [phone] to confirm. He appears to be euvolemic and I would hold off on diuresis as he appears to be headed for a cath tomorrow. Certainly at risk for AKI/ RRT with contrast and risk would increase with prerenal azotemia.  - Agree with gentle hydration + BP control avoiding hypotensive episodes as tolerated to optimize for cath tomorrow. - I explained to the patient that he will be risk for worsening renal function with contrast but almost certainly needs the cath. It's unlikely that he would be permanently on dialysis with the contrast given his level of CKD. - Strict I&O's + daily weights. NSTEMI - on ASA/ statin/ nitro/ heparin/ BBlr and plan for cath tomorrow. HTN - uptitrating medical therapy. DM   HPI: [name] is an 65 y.o. male HTN DM HLD CKD3 followed by Dr. Carson Myrtle presenting to an outside ED with left sided substernal chest pain radiating down the left arm but not associated with shortness of breath. He also had nausea, diaphoresis receiving nitroglycerin and ASA. He has had chest pain on and off over the past few months but had a negative myocardial perfusion stress test 04/2021. He was started on a nitroglycerin infusion in the ED with increasing trend in troponins + heparin before being transferred to [facility]. His creatinine was 1.25 05/08/2020 with an episode of AKI a month later in 05/2020 with incr to th 2.5 range. Cr was 1.86 on 7/24 but has been steadily increasing.   ROS Pertinent items are noted in HPI.  Chemistry and CBC: Creatinine, Ser  Date/Time Value Ref  Range Status  06/08/2021 07:25 AM 2.23 (H) 0.61 - 1.24 mg/dL Final  06/08/2021 01:04 AM 2.19 (H) 0.61 - 1.24 mg/dL Final  06/07/2021 12:25 PM 1.86 (H) 0.61 - 1.24 mg/dL Final  05/18/2020 03:30 AM 2.41 (H) 0.61 - 1.24 mg/dL Final  05/17/2020 03:22 AM 2.57 (H) 0.61 - 1.24 mg/dL Final  05/16/2020 04:44 AM 2.51 (H) 0.61 - 1.24 mg/dL Final  05/15/2020 04:14 PM 2.56 (H) 0.61 - 1.24 mg/dL Final  05/08/2020 04:42 AM 1.25 (H) 0.61 - 1.24 mg/dL Final  05/06/2020 05:32 PM 1.28 (H) 0.61 - 1.24 mg/dL Final  04/02/2020 10:03 AM 1.13 0.61 - 1.24 mg/dL Final  10/01/2019 11:33 AM 1.13 0.76 - 1.27 mg/dL Final  03/06/2019 11:30 AM 0.92 0.76 - 1.27 mg/dL Final  11/10/2018 02:17 PM 1.29 (H) 0.76 - 1.27 mg/dL Final  09/01/2018 02:44 PM 1.14 0.76 - 1.27 mg/dL Final  06/28/2018 01:21 PM 1.14 0.61 - 1.24 mg/dL Final  02/28/2018 11:49 AM 0.99 0.76 - 1.27 mg/dL Final  11/28/2017 12:06 PM 0.93 0.76 - 1.27 mg/dL Final  08/25/2017 11:57 AM 0.95 0.76 - 1.27 mg/dL Final  05/25/2017 10:57 AM 0.95 0.76 - 1.27 mg/dL Final  02/23/2017 10:35 AM 0.86 0.76 - 1.27 mg/dL Final   Recent Labs  Lab 06/07/21 1225 06/08/21 0104 06/08/21 0725  NA 136 136 135  K 3.3* 3.3* 3.3*  CL 105 105 104  CO2 22 24 25   GLUCOSE 238* 219* 218*  BUN 25* 22 25*  CREATININE  1.86* 2.19* 2.23*  CALCIUM 8.9 8.6* 8.6*   Recent Labs  Lab 06/07/21 1225 06/08/21 0104  WBC 11.4* 12.4*  NEUTROABS 8.3*  --   HGB 13.5 11.1*  HCT 37.9* 31.8*  MCV 83.8 83.5  PLT 212 215   Liver Function Tests: Recent Labs  Lab 06/07/21 1225  AST 22  ALT 15  ALKPHOS 93  BILITOT 0.7  PROT 6.8  ALBUMIN 3.7   No results for input(s): LIPASE, AMYLASE in the last 168 hours. No results for input(s): AMMONIA in the last 168 hours. Cardiac Enzymes: No results for input(s): CKTOTAL, CKMB, CKMBINDEX, TROPONINI in the last 168 hours. Iron Studies: No results for input(s): IRON, TIBC, TRANSFERRIN, FERRITIN in the last 72  hours. PT/INR: @LABRCNTIP (inr:5)  Xrays/Other Studies: ) Results for orders placed or performed during the hospital encounter of 06/07/21 (from the past 48 hour(s))  Comprehensive metabolic panel     Status: Abnormal   Collection Time: 06/07/21 12:25 PM  Result Value Ref Range   Sodium 136 135 - 145 mmol/L   Potassium 3.3 (L) 3.5 - 5.1 mmol/L   Chloride 105 98 - 111 mmol/L   CO2 22 22 - 32 mmol/L   Glucose, Bld 238 (H) 70 - 99 mg/dL    Comment: Glucose reference range applies only to samples taken after fasting for at least 8 hours.   BUN 25 (H) 8 - 23 mg/dL   Creatinine, Ser 1.86 (H) 0.61 - 1.24 mg/dL   Calcium 8.9 8.9 - 10.3 mg/dL   Total Protein 6.8 6.5 - 8.1 g/dL   Albumin 3.7 3.5 - 5.0 g/dL   AST 22 15 - 41 U/L   ALT 15 0 - 44 U/L   Alkaline Phosphatase 93 38 - 126 U/L   Total Bilirubin 0.7 0.3 - 1.2 mg/dL   GFR, Estimated 39 (L) >60 mL/min    Comment: (NOTE) Calculated using the CKD-EPI Creatinine Equation (2021)    Anion gap 9 5 - 15    Comment: Performed at [facility], [address] [place], [place] [address]  CBC with Differential     Status: Abnormal   Collection Time: 06/07/21 12:25 PM  Result Value Ref Range   WBC 11.4 (H) 4.0 - 10.5 K/uL   RBC 4.52 4.22 - 5.81 MIL/uL   Hemoglobin 13.5 13.0 - 17.0 g/dL   HCT 37.9 (L) 39.0 - 52.0 %   MCV 83.8 80.0 - 100.0 fL   MCH 29.9 26.0 - 34.0 pg   MCHC 35.6 30.0 - 36.0 g/dL   RDW 13.6 11.5 - 15.5 %   Platelets 212 150 - 400 K/uL   nRBC 0.0 0.0 - 0.2 %   Neutrophils Relative % 72 %   Neutro Abs 8.3 (H) 1.7 - 7.7 K/uL   Lymphocytes Relative 22 %   Lymphs Abs 2.5 0.7 - 4.0 K/uL   Monocytes Relative 4 %   Monocytes Absolute 0.5 0.1 - 1.0 K/uL   Eosinophils Relative 1 %   Eosinophils Absolute 0.1 0.0 - 0.5 K/uL   Basophils Relative 0 %   Basophils Absolute 0.1 0.0 - 0.1 K/uL   Immature Granulocytes 1 %   Abs Immature Granulocytes 0.06 0.00 - 0.07 K/uL    Comment: Performed at [facility], [address]  [place], [place] [address]  Troponin I (High Sensitivity)     Status: Abnormal   Collection Time: 06/07/21 12:25 PM  Result Value Ref Range   Troponin I (High Sensitivity) 173 (HH) <  18 ng/L    Comment: CRITICAL RESULT CALLED TO, READ BACK BY AND VERIFIED WITH:  GANT,E @1409  ON 06/10/21 BY STEPHTR (NOTE) Elevated high sensitivity troponin I (hsTnI) values and significant  changes across serial measurements may suggest ACS but many other  chronic and acute conditions are known to elevate hsTnI results.  Refer to the Links section for chest pain algorithms and additional  guidance. Performed at [facility], [address], [place], [place] [address]   Resp Panel by RT-PCR (Flu A&B, Covid) Nasopharyngeal Swab     Status: None   Collection Time: 06/07/21  2:28 PM   Specimen: Nasopharyngeal Swab; Nasopharyngeal(NP) swabs in vial transport medium  Result Value Ref Range   SARS Coronavirus 2 by RT PCR NEGATIVE NEGATIVE    Comment: (NOTE) SARS-CoV-2 target nucleic acids are NOT DETECTED.  The SARS-CoV-2 RNA is generally detectable in upper respiratory specimens during the acute phase of infection. The lowest concentration of SARS-CoV-2 viral copies this assay can detect is 138 copies/mL. A negative result does not preclude SARS-Cov-2 infection and should not be used as the sole basis for treatment or other patient management decisions. A negative result may occur with  improper specimen collection/handling, submission of specimen other than nasopharyngeal swab, presence of viral mutation(s) within the areas targeted by this assay, and inadequate number of viral copies(<138 copies/mL). A negative result must be combined with clinical observations, patient history, and epidemiological information. The expected result is Negative.  Fact Sheet for Patients:  EntrepreneurPulse.com.au  Fact Sheet for Healthcare Providers:  IncredibleEmployment.be  This test is  no t yet approved or cleared by the [place] FDA and  has been authorized for detection and/or diagnosis of SARS-CoV-2 by FDA under an Emergency Use Authorization (EUA). This EUA will remain  in effect (meaning this test can be used) for the duration of the COVID-19 declaration under Section 564(b)(1) of the Act, 21 U.S.C.section 360bbb-3(b)(1), unless the authorization is terminated  or revoked sooner.       Influenza A by PCR NEGATIVE NEGATIVE   Influenza B by PCR NEGATIVE NEGATIVE    Comment: (NOTE) The Xpert Xpress SARS-CoV-2/FLU/RSV plus assay is intended as an aid in the diagnosis of influenza from Nasopharyngeal swab specimens and should not be used as a sole basis for treatment. Nasal washings and aspirates are unacceptable for Xpert Xpress SARS-CoV-2/FLU/RSV testing.  Fact Sheet for Patients: EntrepreneurPulse.com.au  Fact Sheet for Healthcare Providers: IncredibleEmployment.be  This test is not yet approved or cleared by the [place] FDA and has been authorized for detection and/or diagnosis of SARS-CoV-2 by FDA under an Emergency Use Authorization (EUA). This EUA will remain in effect (meaning this test can be used) for the duration of the COVID-19 declaration under Section 564(b)(1) of the Act, 21 U.S.C. section 360bbb-3(b)(1), unless the authorization is terminated or revoked.  Performed at [facility], [address] [place], [place] [address]   Troponin I (High Sensitivity)     Status: Abnormal   Collection Time: 06/07/21  3:35 PM  Result Value Ref Range   Troponin I (High Sensitivity) 845 (HH) <18 ng/L    Comment: CRITICAL RESULT CALLED TO, READ BACK BY AND VERIFIED WITH: BARWFORD,H @ 1621 ON 06/07/21 BY STEPHTR (NOTE) Elevated high sensitivity troponin I (hsTnI) values and significant  changes across serial measurements may suggest ACS but many other  chronic and acute conditions are known to elevate hsTnI  results.  Refer to the Links section for chest pain algorithms and  additional  guidance. Performed at [facility], [address], [place], [place] [address]   Heparin level (unfractionated)     Status: Abnormal   Collection Time: 06/07/21  8:35 PM  Result Value Ref Range   Heparin Unfractionated 0.21 (L) 0.30 - 0.70 IU/mL    Comment: (NOTE) The clinical reportable range upper limit is being lowered to >1.10 to align with the FDA approved guidance for the current laboratory assay.  If heparin results are below expected values, and patient dosage has  been confirmed, suggest follow up testing of antithrombin III levels. Performed at [facility], [address] [place], [place] [address]   Heparin level (unfractionated)     Status: Abnormal   Collection Time: 06/08/21  1:04 AM  Result Value Ref Range   Heparin Unfractionated 0.13 (L) 0.30 - 0.70 IU/mL    Comment: (NOTE) The clinical reportable range upper limit is being lowered to >1.10 to align with the FDA approved guidance for the current laboratory assay.  If heparin results are below expected values, and patient dosage has  been confirmed, suggest follow up testing of antithrombin III levels. Performed at [facility], [place] [address] [place], [place] [address]   HIV Antibody (routine testing w rflx)     Status: None   Collection Time: 06/08/21  1:04 AM  Result Value Ref Range   HIV Screen 4th Generation wRfx Non Reactive Non Reactive    Comment: Performed at [facility], [place] [address], [place], [place] [address]  Basic metabolic panel     Status: Abnormal   Collection Time: 06/08/21  1:04 AM  Result Value Ref Range   Sodium 136 135 - 145 mmol/L   Potassium 3.3 (L) 3.5 - 5.1 mmol/L   Chloride 105 98 - 111 mmol/L   CO2 24 22 - 32 mmol/L   Glucose, Bld 219 (H) 70 - 99 mg/dL    Comment: Glucose reference range applies only to samples taken after fasting for at least 8 hours.   BUN 22 8 - 23 mg/dL    Creatinine, Ser 2.19 (H) 0.61 - 1.24 mg/dL   Calcium 8.6 (L) 8.9 - 10.3 mg/dL   GFR, Estimated 32 (L) >60 mL/min    Comment: (NOTE) Calculated using the CKD-EPI Creatinine Equation (2021)    Anion gap 7 5 - 15    Comment: Performed at [facility] [address], [place], [place] [address]  Lipid panel     Status: Abnormal   Collection Time: 06/08/21  1:04 AM  Result Value Ref Range   Cholesterol 160 0 - 200 mg/dL   Triglycerides 120 <150 mg/dL   HDL 34 (L) >40 mg/dL   Total CHOL/HDL Ratio 4.7 RATIO   VLDL 24 0 - 40 mg/dL   LDL Cholesterol 102 (H) 0 - 99 mg/dL    Comment:        Total Cholesterol/HDL:CHD Risk Coronary Heart Disease Risk Table                     Men   Women  1/2 Average Risk   3.4   3.3  Average Risk       5.0   4.4  2 X Average Risk   9.6   7.1  3 X Average Risk  23.4   11.0        Use the calculated Patient Ratio above and the CHD Risk Table to determine the patient's CHD  Risk.        ATP III CLASSIFICATION (LDL):  <100     mg/dL   Optimal  100-129  mg/dL   Near or Above                    Optimal  130-159  mg/dL   Borderline  160-189  mg/dL   High  >190     mg/dL   Very High Performed at [facility] [address], [place], [place] [address]   CBC     Status: Abnormal   Collection Time: 06/08/21  1:04 AM  Result Value Ref Range   WBC 12.4 (H) 4.0 - 10.5 K/uL   RBC 3.81 (L) 4.22 - 5.81 MIL/uL   Hemoglobin 11.1 (L) 13.0 - 17.0 g/dL   HCT 31.8 (L) 39.0 - 52.0 %   MCV 83.5 80.0 - 100.0 fL   MCH 29.1 26.0 - 34.0 pg   MCHC 34.9 30.0 - 36.0 g/dL   RDW 13.6 11.5 - 15.5 %   Platelets 215 150 - 400 K/uL   nRBC 0.0 0.0 - 0.2 %    Comment: Performed at [facility], [employer] [address] [place], [place] [address]  Protime-INR     Status: None   Collection Time: 06/08/21  1:04 AM  Result Value Ref Range   Prothrombin Time 14.1 11.4 - 15.2 seconds   INR 1.1 0.8 - 1.2    Comment: (NOTE) INR goal varies based on device and disease  states. Performed at [facility], [place] [address] [place], [place] [address]   Glucose, capillary     Status: Abnormal   Collection Time: 06/08/21  6:52 AM  Result Value Ref Range   Glucose-Capillary 228 (H) 70 - 99 mg/dL    Comment: Glucose reference range applies only to samples taken after fasting for at least 8 hours.  Troponin I (High Sensitivity)     Status: Abnormal   Collection Time: 06/08/21  7:25 AM  Result Value Ref Range   Troponin I (High Sensitivity) 10,056 (HH) <18 ng/L    Comment: CRITICAL RESULT CALLED TO, READ BACK BY AND VERIFIED WITH: M.FEROLITO,RN 0825 06/08/21 CLARK,S (NOTE) Elevated high sensitivity troponin I (hsTnI) values and significant  changes across serial measurements may suggest ACS but many other  chronic and acute conditions are known to elevate hsTnI results.  Refer to the Links section for chest pain algorithms and additional  guidance. Performed at [facility], [place] [address] [place] [address] [place], [place] [address]   Basic metabolic panel     Status: Abnormal   Collection Time: 06/08/21  7:25 AM  Result Value Ref Range   Sodium 135 135 - 145 mmol/L   Potassium 3.3 (L) 3.5 - 5.1 mmol/L   Chloride 104 98 - 111 mmol/L   CO2 25 22 - 32 mmol/L   Glucose, Bld 218 (H) 70 - 99 mg/dL    Comment: Glucose reference range applies only to samples taken after fasting for at least 8 hours.   BUN 25 (H) 8 - 23 mg/dL   Creatinine, Ser 2.23 (H) 0.61 - 1.24 mg/dL   Calcium 8.6 (L) 8.9 - 10.3 mg/dL   GFR, Estimated 32 (L) >60 mL/min    Comment: (NOTE) Calculated using the CKD-EPI Creatinine Equation (2021)    Anion gap 6 5 - 15    Comment: Performed at [facility] [address], [place], [place] [address]  DG Chest 2 View  Result Date: 06/07/2021 CLINICAL DATA:  Chest pain. EXAM: CHEST - 2 VIEW COMPARISON:  05/31/2018 FINDINGS: The heart size and mediastinal contours are within normal limits. There is no evidence of pulmonary edema,  consolidation, pneumothorax, nodule or pleural fluid. The visualized skeletal structures are unremarkable. IMPRESSION: No active cardiopulmonary disease. Electronically Signed   By: Aletta Edouard M.D.   On: 06/07/2021 14:38    PMH:   Past Medical History:  Diagnosis Date   Chronic kidney disease    Complication of anesthesia    Hard to wake   Degenerative joint disease (DJD) of lumbar spine    Diabetes mellitus without complication ([place])    Type II   Essential hypertension 02/23/2017   Lumbar stenosis    NSTEMI (non-ST elevated myocardial infarction) ([place]) 06/07/2021    PSH:   Past Surgical History:  Procedure Laterality Date   AMPUTATION TOE Right 05/17/2020   Procedure: AMPUTATION TOE partial right great toe;  Surgeon: Evelina Bucy, DPM;  Location: [facility];  Service: Podiatry;  Laterality: Right;   BELPHAROPTOSIS REPAIR     CARDIAC CATHETERIZATION     EYE SURGERY     HEMORRHOID SURGERY     skin cancer removed right ear     TRANSFORAMINAL LUMBAR INTERBODY FUSION (TLIF) WITH PEDICLE SCREW FIXATION 2 LEVEL N/A 07/06/2018   Procedure: TRANSFORAMINAL LUMBAR INTERBODY FUSION (TLIF) L4-S1;  Surgeon: Melina Schools, MD;  Location: [facility];  Service: Orthopedics;  Laterality: N/A;    Allergies: No Known Allergies  Medications:   Prior to Admission medications   Medication Sig Start Date End Date Taking? Authorizing Provider  amLODipine (NORVASC) 10 MG tablet Take 1 tablet (10 mg total) by mouth daily. 08/28/20  Yes Gottschalk, Leatrice Jewels M, DO  cloNIDine (CATAPRES) 0.3 MG tablet Take 1 tablet (0.3 mg total) by mouth 2 (two) times daily. 08/28/20  Yes Gottschalk, Leatrice Jewels M, DO  furosemide (LASIX) 20 MG tablet Take 20 mg by mouth daily. 06/02/21  Yes [provider]  gabapentin (NEURONTIN) 100 MG capsule Take 1 capsule (100 mg total) by mouth 2 (two) times daily. 03/30/21  Yes Gottschalk, Leatrice Jewels M, DO  losartan (COZAAR) 25 MG tablet Take 12.5 mg by mouth daily. 01/14/21 01/14/22 Yes [provider]  tamsulosin (FLOMAX) 0.4 MG CAPS capsule Take 1 capsule (0.4 mg total) by mouth daily after supper. 09/29/20  Yes Gottschalk, Ashly M, DO  TRESIBA FLEXTOUCH 100 UNIT/ML FlexTouch Pen INJECT UP TO 36 UNITS EVERY MORNING. (MAY INCREASE BY 1 UNIT EVERY OTHER DAY TIL FBS BELOW 150) Patient taking differently: Inject 30 Units into the skin daily. 05/11/21  Yes Ronnie Doss M, DO  cholecalciferol (VITAMIN D3) 25 MCG (1000 UNIT) tablet Take 2,000 Units by mouth daily. Patient not taking: Reported on 06/07/2021    [provider]  ferrous sulfate 325 (65 FE) MG tablet Take 325 mg by mouth daily with breakfast. Patient not taking: No sig reported    [provider]  Insulin Pen Needle (PEN NEEDLES) 31G X 5 MM MISC Use daily with insulin Dx E11.9 12/09/20   Ronnie Doss M, DO  loratadine (CLARITIN) 10 MG tablet Take 1 tablet (10 mg total) by mouth every other day. (renally dosed) Patient not taking: No sig reported 11/28/20   [name], DO  sildenafil (VIAGRA) 25 MG tablet Take 1-2 tablets (25-50 mg total) by mouth daily as needed for erectile dysfunction. Patient not taking: No sig reported 06/18/20  Ronnie Doss M, DO  vitamin B-12 (CYANOCOBALAMIN) 500 MCG tablet Take 500 mcg by mouth daily. Patient not taking: No sig reported    [provider]    Discontinued Meds:   Medications Discontinued During This Encounter  Medication Reason   heparin ADULT infusion 100 units/mL (25000 units/283mL)     Social History:  reports that he has never smoked. He has never used smokeless tobacco. He reports current alcohol use of about 6.0 standard drinks of alcohol per week. He reports that he does not use drugs.  Family History:   Family History  Problem Relation Age of Onset   Heart attack Father 52       Died suddenly   Hypertension Father    Vision loss Sister        one eye   Cancer Brother        skin   Diabetes Brother     Blood  pressure (!) 145/74, pulse 69, temperature 98.4 F (36.9 C), temperature source Oral, resp. rate 13, height 6' (1.829 m), weight 95.3 kg, SpO2 96 %. General appearance: alert, cooperative, and appears stated age Head: Normocephalic, without obvious abnormality, atraumatic Neck: no adenopathy, no carotid bruit, no JVD, supple, symmetrical, trachea midline, and thyroid not enlarged, symmetric, no tenderness/mass/nodules Back: symmetric, no curvature. ROM normal. No CVA tenderness. Resp: clear to auscultation bilaterally Cardio: regular rate and rhythm GI: soft, non-tender; bowel sounds normal; no masses,  no organomegaly Extremities: edema none Pulses:  L brachial 2+ R brachial 2+  L radial 2+ R radial 2+  L inguinal 2+ R inguinal 2+  L popliteal 2+ R popliteal 2+  L posterior tibial 2+ R posterior tibial 2+  L dorsalis pedis 2+ R dorsalis pedis 2+   Skin: Skin color, texture, turgor normal. No rashes or lesions Lymph nodes: Cervical adenopathy: none Neurologic: Grossly normal       Kili Gracy, Hunt Oris, MD 06/08/2021, 9:14 AM

## 2021-06-08 NOTE — H&P (View-Only) (Signed)
Progress Note  Patient Name: [name] Date of Encounter: 06/08/2021  [facility] Cardiologist: Minus Breeding, MD   Subjective   He is not currently having chest pain.  Did have continuous chest pain starting Sunday morning and resolving by 3 AM today.  He is on IV nitroglycerin and IV heparin.  Recent nuclear study did not demonstrate a large region of ischemia.  LVEF on that study was normal.  Inpatient Medications    Scheduled Meds:  aspirin EC  81 mg Oral Daily   atorvastatin  80 mg Oral Daily   carvedilol  6.25 mg Oral BID WC   Chlorhexidine Gluconate Cloth  6 each Topical Daily   gabapentin  100 mg Oral BID   insulin aspart  0-9 Units Subcutaneous TID AC & HS   sodium chloride flush  3 mL Intravenous Q12H   tamsulosin  0.4 mg Oral QPC supper   Continuous Infusions:  sodium chloride 1 mL/kg/hr (06/08/21 0846)   heparin 1,700 Units/hr (06/08/21 0800)   nitroGLYCERIN 145 mcg/min (06/08/21 0800)   PRN Meds: acetaminophen, ondansetron (ZOFRAN) IV   Vital Signs    Vitals:   06/08/21 0630 06/08/21 0655 06/08/21 0700 06/08/21 0800  BP: (!) 146/73  (!) 155/74 (!) 145/74  Pulse: 67  71 69  Resp: 12  14 13   Temp:  98.4 F (36.9 C)    TempSrc:  Oral    SpO2: 96%  97% 96%  Weight:      Height:        Intake/Output Summary (Last 24 hours) at 06/08/2021 0954 Last data filed at 06/08/2021 0800 Gross per 24 hour  Intake 816.76 ml  Output 500 ml  Net 316.76 ml   Last 3 Weights 06/07/2021 04/08/2021 03/30/2021  Weight (lbs) 210 lb 205 lb 202 lb 6.4 oz  Weight (kg) 95.255 kg 92.987 kg 91.808 kg      Telemetry    Normal sinus rhythm- Personally Reviewed  ECG    Performed this morning reveals nonspecific ST-T wave changes.  No Q waves.  No obvious ischemic change.- Personally Reviewed  Physical Exam  Overweight GEN: No acute distress.   Neck: No JVD Cardiac: RRR, no murmurs, rubs, or gallops.  Respiratory: Clear to auscultation bilaterally. GI:  Soft, nontender, non-distended  MS: No edema; No deformity. Neuro:  Nonfocal  Psych: Normal affect   Labs    High Sensitivity Troponin:   Recent Labs  Lab 06/07/21 1225 06/07/21 1535 06/08/21 0725  TROPONINIHS 173* 845* 10,056*      Chemistry Recent Labs  Lab 06/07/21 1225 06/08/21 0104 06/08/21 0725  NA 136 136 135  K 3.3* 3.3* 3.3*  CL 105 105 104  CO2 22 24 25   GLUCOSE 238* 219* 218*  BUN 25* 22 25*  CREATININE 1.86* 2.19* 2.23*  CALCIUM 8.9 8.6* 8.6*  PROT 6.8  --   --   ALBUMIN 3.7  --   --   AST 22  --   --   ALT 15  --   --   ALKPHOS 93  --   --   BILITOT 0.7  --   --   GFRNONAA 39* 32* 32*  ANIONGAP 9 7 6      Hematology Recent Labs  Lab 06/07/21 1225 06/08/21 0104  WBC 11.4* 12.4*  RBC 4.52 3.81*  HGB 13.5 11.1*  HCT 37.9* 31.8*  MCV 83.8 83.5  MCH 29.9 29.1  MCHC 35.6 34.9  RDW 13.6 13.6  PLT 212  215    BNPNo results for input(s): BNP, PROBNP in the last 168 hours.   DDimer No results for input(s): DDIMER in the last 168 hours.   Radiology    DG Chest 2 View  Result Date: 06/07/2021 CLINICAL DATA:  Chest pain. EXAM: CHEST - 2 VIEW COMPARISON:  05/31/2018 FINDINGS: The heart size and mediastinal contours are within normal limits. There is no evidence of pulmonary edema, consolidation, pneumothorax, nodule or pleural fluid. The visualized skeletal structures are unremarkable. IMPRESSION: No active cardiopulmonary disease. Electronically Signed   By: Aletta Edouard M.D.   On: 06/07/2021 14:38    Cardiac Studies   Echo has been performed but is not yet loaded.  Patient Profile     65 y.o. male hypertension, type 2 diabetes, [name], and non-ST elevation MI with symptoms developing over the 72 hours prior to admission.  Assessment & Plan    Non-ST segment elevation myocardial infarction: We will need coronary angiography.  He is now pain-free.  Suspect occlusion of the circumflex branch but will not know until coronary  angiography.  Since he is stable will hydrate over the next 24 hours and perform coronary angiography tomorrow morning. Acute on chronic stage III kidney disease: Appreciate nephrology consultation.  IV hydration.  ARB has been discontinued.  Creatinine has risen slightly overnight.  Because of this we will defer coronary angiography for 24 hours. Diabetes mellitus, type II: Consider adding SGLT2 therapy as this may give kidney protection going forward.  We will also certainly decrease the likelihood of future cardiac events.  Will discuss with nephrology. Primary hypertension: Target 130/80 mmHg.  Hold diuretic therapy for the time being.  Beta-blocker therapy and dihydropyridine calcium channel blocker therapy is appropriate.  Okay to continue clonidine as well. Hyperlipidemia: Target LDL less than 70.  High intensity statin therapy warranted.   The patient was counseled to undergo left heart catheterization, coronary angiography, and possible percutaneous coronary intervention with stent implantation. The procedural risks and benefits were discussed in detail. The risks discussed included death, stroke, myocardial infarction, life-threatening bleeding, limb ischemia, kidney injury, allergy, and possible emergency cardiac surgery. The risk of these significant complications were estimated to occur less than 1% of the time. After discussion, the patient has agreed to proceed.   For questions or updates, please contact [place] Please consult [email or web address] for contact info under        Signed, Sinclair Grooms, MD  06/08/2021, 9:54 AM

## 2021-06-08 NOTE — Progress Notes (Addendum)
Progress Note  Patient Name: [name] Date of Encounter: 06/08/2021  [facility] Cardiologist: Minus Breeding, MD   Subjective   He is not currently having chest pain.  Did have continuous chest pain starting Sunday morning and resolving by 3 AM today.  He is on IV nitroglycerin and IV heparin.  Recent nuclear study did not demonstrate a large region of ischemia.  LVEF on that study was normal.  Inpatient Medications    Scheduled Meds:  aspirin EC  81 mg Oral Daily   atorvastatin  80 mg Oral Daily   carvedilol  6.25 mg Oral BID WC   Chlorhexidine Gluconate Cloth  6 each Topical Daily   gabapentin  100 mg Oral BID   insulin aspart  0-9 Units Subcutaneous TID AC & HS   sodium chloride flush  3 mL Intravenous Q12H   tamsulosin  0.4 mg Oral QPC supper   Continuous Infusions:  sodium chloride 1 mL/kg/hr (06/08/21 0846)   heparin 1,700 Units/hr (06/08/21 0800)   nitroGLYCERIN 145 mcg/min (06/08/21 0800)   PRN Meds: acetaminophen, ondansetron (ZOFRAN) IV   Vital Signs    Vitals:   06/08/21 0630 06/08/21 0655 06/08/21 0700 06/08/21 0800  BP: (!) 146/73  (!) 155/74 (!) 145/74  Pulse: 67  71 69  Resp: 12  14 13   Temp:  98.4 F (36.9 C)    TempSrc:  Oral    SpO2: 96%  97% 96%  Weight:      Height:        Intake/Output Summary (Last 24 hours) at 06/08/2021 0954 Last data filed at 06/08/2021 0800 Gross per 24 hour  Intake 816.76 ml  Output 500 ml  Net 316.76 ml   Last 3 Weights 06/07/2021 04/08/2021 03/30/2021  Weight (lbs) 210 lb 205 lb 202 lb 6.4 oz  Weight (kg) 95.255 kg 92.987 kg 91.808 kg      Telemetry    Normal sinus rhythm- Personally Reviewed  ECG    Performed this morning reveals nonspecific ST-T wave changes.  No Q waves.  No obvious ischemic change.- Personally Reviewed  Physical Exam  Overweight GEN: No acute distress.   Neck: No JVD Cardiac: RRR, no murmurs, rubs, or gallops.  Respiratory: Clear to auscultation bilaterally. GI:  Soft, nontender, non-distended  MS: No edema; No deformity. Neuro:  Nonfocal  Psych: Normal affect   Labs    High Sensitivity Troponin:   Recent Labs  Lab 06/07/21 1225 06/07/21 1535 06/08/21 0725  TROPONINIHS 173* 845* 10,056*      Chemistry Recent Labs  Lab 06/07/21 1225 06/08/21 0104 06/08/21 0725  NA 136 136 135  K 3.3* 3.3* 3.3*  CL 105 105 104  CO2 22 24 25   GLUCOSE 238* 219* 218*  BUN 25* 22 25*  CREATININE 1.86* 2.19* 2.23*  CALCIUM 8.9 8.6* 8.6*  PROT 6.8  --   --   ALBUMIN 3.7  --   --   AST 22  --   --   ALT 15  --   --   ALKPHOS 93  --   --   BILITOT 0.7  --   --   GFRNONAA 39* 32* 32*  ANIONGAP 9 7 6      Hematology Recent Labs  Lab 06/07/21 1225 06/08/21 0104  WBC 11.4* 12.4*  RBC 4.52 3.81*  HGB 13.5 11.1*  HCT 37.9* 31.8*  MCV 83.8 83.5  MCH 29.9 29.1  MCHC 35.6 34.9  RDW 13.6 13.6  PLT 212  215    BNPNo results for input(s): BNP, PROBNP in the last 168 hours.   DDimer No results for input(s): DDIMER in the last 168 hours.   Radiology    DG Chest 2 View  Result Date: 06/07/2021 CLINICAL DATA:  Chest pain. EXAM: CHEST - 2 VIEW COMPARISON:  05/31/2018 FINDINGS: The heart size and mediastinal contours are within normal limits. There is no evidence of pulmonary edema, consolidation, pneumothorax, nodule or pleural fluid. The visualized skeletal structures are unremarkable. IMPRESSION: No active cardiopulmonary disease. Electronically Signed   By: Aletta Edouard M.D.   On: 06/07/2021 14:38    Cardiac Studies   Echo has been performed but is not yet loaded.  Patient Profile     65 y.o. male hypertension, type 2 diabetes, [name], and non-ST elevation MI with symptoms developing over the 72 hours prior to admission.  Assessment & Plan    Non-ST segment elevation myocardial infarction: We will need coronary angiography.  He is now pain-free.  Suspect occlusion of the circumflex branch but will not know until coronary  angiography.  Since he is stable will hydrate over the next 24 hours and perform coronary angiography tomorrow morning. Acute on chronic stage III kidney disease: Appreciate nephrology consultation.  IV hydration.  ARB has been discontinued.  Creatinine has risen slightly overnight.  Because of this we will defer coronary angiography for 24 hours. Diabetes mellitus, type II: Consider adding SGLT2 therapy as this may give kidney protection going forward.  We will also certainly decrease the likelihood of future cardiac events.  Will discuss with nephrology. Primary hypertension: Target 130/80 mmHg.  Hold diuretic therapy for the time being.  Beta-blocker therapy and dihydropyridine calcium channel blocker therapy is appropriate.  Okay to continue clonidine as well. Hyperlipidemia: Target LDL less than 70.  High intensity statin therapy warranted.   The patient was counseled to undergo left heart catheterization, coronary angiography, and possible percutaneous coronary intervention with stent implantation. The procedural risks and benefits were discussed in detail. The risks discussed included death, stroke, myocardial infarction, life-threatening bleeding, limb ischemia, kidney injury, allergy, and possible emergency cardiac surgery. The risk of these significant complications were estimated to occur less than 1% of the time. After discussion, the patient has agreed to proceed.   For questions or updates, please contact [place] Please consult [email or web address] for contact info under        Signed, Sinclair Grooms, MD  06/08/2021, 9:54 AM

## 2021-06-08 NOTE — Plan of Care (Signed)
Pt admitted as transfer from [facility] ED w/ complaints of chest pain at home.  Pt arrived on Nitro and Heparin gtt.  Nitro gtt titrated w/ complaints of 1/10 chest pain.  Pt has been NPO since 0000.   Problem: Clinical Measurements: Goal: Ability to maintain clinical measurements within normal limits will improve Outcome: Progressing

## 2021-06-08 NOTE — Progress Notes (Signed)
[place] for Heparin Indication: chest pain/ACS  HEPARIN DW (KG): 95.3   Labs: Recent Labs    06/07/21 1225 06/07/21 1535 06/07/21 2035 06/08/21 0104  HGB 13.5  --   --  11.1*  HCT 37.9*  --   --  31.8*  PLT 212  --   --  215  LABPROT  --   --   --  14.1  INR  --   --   --  1.1  HEPARINUNFRC  --   --  0.21* 0.13*  CREATININE 1.86*  --   --  2.19*  TROPONINIHS 173* 845*  --   --     Medications:  See med rec  Assessment: Patient presented to ED with left sided chest pain and radiating down his arm. Troponins elevated. Pharmacy asked to dose heparin.  -heparin level 0.13 units/ml  Goal of Therapy:  Heparin level 0.3-0.7 units/ml Monitor platelets by anticoagulation protocol: Yes   Plan:  Heparin 2000 units x1 then increase infusion to 1700 units/hr Check heparin level in 6-8 hours and daily  Thanks for allowing pharmacy to be a part of this patient's care.  Hildred Laser, PharmD Clinical Pharmacist **Pharmacist phone directory can now be found on [email or web address] ([email or web address]).  Listed under [place].

## 2021-06-08 NOTE — Progress Notes (Addendum)
[place] for Heparin Indication: chest pain/ACS  HEPARIN DW (KG): 95.3   Labs: Recent Labs    06/07/21 1225 06/07/21 1535 06/07/21 2035 06/08/21 0104 06/08/21 0725 06/08/21 1054 06/08/21 1449  HGB 13.5  --   --  11.1*  --   --   --   HCT 37.9*  --   --  31.8*  --   --   --   PLT 212  --   --  215  --   --   --   LABPROT  --   --   --  14.1  --   --   --   INR  --   --   --  1.1  --   --   --   HEPARINUNFRC  --   --  0.21* 0.13*  --   --  0.30  CREATININE 1.86*  --   --  2.19* 2.23*  --   --   TROPONINIHS 173* 845*  --   --  10,056* 8,206*  --     Medications:  See med rec  Assessment: Patient presented to ED with left sided chest pain and radiating down his arm. Troponins elevated. Pharmacy asked to dose heparin.   Heparin level came back therapeutic at 0.3, on 1700 units/hr. Trop now trending down to 8206. Plan for [facility] tomorrow. No s/sx of bleeding or infusion issues.   Goal of Therapy:  Heparin level 0.3-0.7 units/ml Monitor platelets by anticoagulation protocol: Yes   Plan:  Increase heparin infusion to 1800 units/hr Check heparin level in 6-8 hours and daily  Thanks for allowing pharmacy to be a part of this patient's care.  Antonietta Jewel, PharmD, [place] Clinical Pharmacist  Phone: [phone] 06/08/2021 4:11 PM  Please check AMION for all [place] phone numbers After 10:00 PM, call [place] [phone]

## 2021-06-09 ENCOUNTER — Inpatient Hospital Stay (HOSPITAL_COMMUNITY)
Admission: EM | Disposition: A | Discharge status: Home / Self Care | Payer: Self-pay | Attending: Interventional Cardiology

## 2021-06-09 ENCOUNTER — Encounter (HOSPITAL_COMMUNITY): Payer: Self-pay | Admitting: Cardiology

## 2021-06-09 DIAGNOSIS — E785 Hyperlipidemia, unspecified: Secondary | ICD-10-CM

## 2021-06-09 DIAGNOSIS — I2510 Atherosclerotic heart disease of native coronary artery without angina pectoris: Secondary | ICD-10-CM

## 2021-06-09 HISTORY — PX: LEFT HEART CATH AND CORONARY ANGIOGRAPHY: CATH118249

## 2021-06-09 LAB — BASIC METABOLIC PANEL
Anion gap: 5 (ref 5–15)
Anion gap: 4 — ABNORMAL LOW (ref 5–15)
BUN: 19 mg/dL (ref 8–23)
BUN: 17 mg/dL (ref 8–23)
CO2: 25 mmol/L (ref 22–32)
CO2: 24 mmol/L (ref 22–32)
Calcium: 8.2 mg/dL — ABNORMAL LOW (ref 8.9–10.3)
Calcium: 8.2 mg/dL — ABNORMAL LOW (ref 8.9–10.3)
Chloride: 107 mmol/L (ref 98–111)
Chloride: 108 mmol/L (ref 98–111)
Creatinine, Ser: 1.95 mg/dL — ABNORMAL HIGH (ref 0.61–1.24)
Creatinine, Ser: 1.84 mg/dL — ABNORMAL HIGH (ref 0.61–1.24)
GFR, Estimated: 37 mL/min — ABNORMAL LOW (ref >60)
GFR, Estimated: 40 mL/min — ABNORMAL LOW (ref >60)
Glucose, Bld: 145 mg/dL — ABNORMAL HIGH (ref 70–99)
Glucose, Bld: 133 mg/dL — ABNORMAL HIGH (ref 70–99)
Potassium: 3.6 mmol/L (ref 3.5–5.1)
Potassium: 3.7 mmol/L (ref 3.5–5.1)
Sodium: 137 mmol/L (ref 135–145)
Sodium: 136 mmol/L (ref 135–145)

## 2021-06-09 LAB — GLUCOSE, CAPILLARY
Glucose-Capillary: 133 mg/dL — ABNORMAL HIGH (ref 70–99)
Glucose-Capillary: 110 mg/dL — ABNORMAL HIGH (ref 70–99)
Glucose-Capillary: 171 mg/dL — ABNORMAL HIGH (ref 70–99)
Glucose-Capillary: 156 mg/dL — ABNORMAL HIGH (ref 70–99)

## 2021-06-09 LAB — CBC
HCT: 29.5 % — ABNORMAL LOW (ref 39.0–52.0)
Hemoglobin: 10.5 g/dL — ABNORMAL LOW (ref 13.0–17.0)
MCH: 30.3 pg (ref 26.0–34.0)
MCHC: 35.6 g/dL (ref 30.0–36.0)
MCV: 85.3 fL (ref 80.0–100.0)
Platelets: 191 10*3/uL (ref 150–400)
RBC: 3.46 MIL/uL — ABNORMAL LOW (ref 4.22–5.81)
RDW: 13.7 % (ref 11.5–15.5)
WBC: 12.1 10*3/uL — ABNORMAL HIGH (ref 4.0–10.5)
nRBC: 0 % (ref 0.0–0.2)

## 2021-06-09 LAB — HEPARIN LEVEL (UNFRACTIONATED): Heparin Unfractionated: 0.36 IU/mL (ref 0.30–0.70)

## 2021-06-09 SURGERY — LEFT HEART CATH AND CORONARY ANGIOGRAPHY
Anesthesia: LOCAL

## 2021-06-09 MED ORDER — TICAGRELOR 90 MG PO TABS
180.0000 mg | ORAL_TABLET | Freq: Once | ORAL | Status: AC
  Administered 2021-06-09: 180 mg via ORAL
  Filled 2021-06-09: qty 2

## 2021-06-09 MED ORDER — CARVEDILOL 6.25 MG PO TABS
6.2500 mg | ORAL_TABLET | Freq: Once | ORAL | Status: AC
  Administered 2021-06-09: 6.25 mg via ORAL
  Filled 2021-06-09: qty 1

## 2021-06-09 MED ORDER — CARVEDILOL 12.5 MG PO TABS
12.5000 mg | ORAL_TABLET | Freq: Two times a day (BID) | ORAL | Status: DC
  Administered 2021-06-10 (×2): 12.5 mg via ORAL
  Filled 2021-06-09 (×3): qty 1

## 2021-06-09 MED ORDER — LIDOCAINE HCL (PF) 1 % IJ SOLN
INTRAMUSCULAR | Status: AC
  Filled 2021-06-09: qty 30

## 2021-06-09 MED ORDER — MIDAZOLAM HCL 2 MG/2ML IJ SOLN
INTRAMUSCULAR | Status: AC
  Filled 2021-06-09: qty 2

## 2021-06-09 MED ORDER — FENTANYL CITRATE (PF) 100 MCG/2ML IJ SOLN
INTRAMUSCULAR | Status: DC | PRN
  Administered 2021-06-09: 25 ug via INTRAVENOUS

## 2021-06-09 MED ORDER — HEPARIN SODIUM (PORCINE) 1000 UNIT/ML IJ SOLN
INTRAMUSCULAR | Status: AC
  Filled 2021-06-09: qty 1

## 2021-06-09 MED ORDER — IOHEXOL 350 MG/ML SOLN
INTRAVENOUS | Status: DC | PRN
  Administered 2021-06-09: 55 mL via INTRA_ARTERIAL

## 2021-06-09 MED ORDER — SODIUM CHLORIDE 0.9 % IV SOLN: 250.0000 mL | INTRAVENOUS | Status: DC | PRN

## 2021-06-09 MED ORDER — SODIUM CHLORIDE 0.9% FLUSH: 3.0000 mL | INTRAVENOUS | Status: DC | PRN

## 2021-06-09 MED ORDER — FENTANYL CITRATE (PF) 100 MCG/2ML IJ SOLN
INTRAMUSCULAR | Status: AC
  Filled 2021-06-09: qty 2

## 2021-06-09 MED ORDER — SODIUM CHLORIDE 0.9 % WEIGHT BASED INFUSION
1.0000 mL/kg/h | INTRAVENOUS | Status: AC
  Administered 2021-06-09 (×2): 1 mL/kg/h via INTRAVENOUS

## 2021-06-09 MED ORDER — TICAGRELOR 90 MG PO TABS
90.0000 mg | ORAL_TABLET | Freq: Two times a day (BID) | ORAL | Status: DC
  Administered 2021-06-09 – 2021-06-12 (×5): 90 mg via ORAL
  Filled 2021-06-09 (×6): qty 1

## 2021-06-09 MED ORDER — MIDAZOLAM HCL 2 MG/2ML IJ SOLN
INTRAMUSCULAR | Status: DC | PRN
  Administered 2021-06-09: 1 mg via INTRAVENOUS

## 2021-06-09 MED ORDER — HYDRALAZINE HCL 20 MG/ML IJ SOLN
10.0000 mg | INTRAMUSCULAR | Status: AC | PRN
  Filled 2021-06-09: qty 1

## 2021-06-09 MED ORDER — HEPARIN (PORCINE) IN NACL 1000-0.9 UT/500ML-% IV SOLN
INTRAVENOUS | Status: DC | PRN
  Administered 2021-06-09 (×2): 500 mL

## 2021-06-09 MED ORDER — LIDOCAINE HCL (PF) 1 % IJ SOLN
INTRAMUSCULAR | Status: DC | PRN
  Administered 2021-06-09: 2 mL via INTRADERMAL

## 2021-06-09 MED ORDER — HEPARIN SODIUM (PORCINE) 1000 UNIT/ML IJ SOLN
INTRAMUSCULAR | Status: DC | PRN
  Administered 2021-06-09: 5000 [IU] via INTRAVENOUS

## 2021-06-09 MED ORDER — LABETALOL HCL 5 MG/ML IV SOLN: 10.0000 mg | INTRAVENOUS | Status: AC | PRN

## 2021-06-09 MED ORDER — ONDANSETRON HCL 4 MG/2ML IJ SOLN
4.0000 mg | Freq: Four times a day (QID) | INTRAMUSCULAR | Status: DC | PRN
  Administered 2021-06-09: 4 mg via INTRAVENOUS
  Filled 2021-06-09: qty 2

## 2021-06-09 MED ORDER — HEPARIN (PORCINE) IN NACL 1000-0.9 UT/500ML-% IV SOLN
INTRAVENOUS | Status: AC
  Filled 2021-06-09: qty 1000

## 2021-06-09 MED ORDER — HEPARIN (PORCINE) 25000 UT/250ML-% IV SOLN
2000.0000 [IU]/h | INTRAVENOUS | Status: DC
  Administered 2021-06-09: 1800 [IU]/h via INTRAVENOUS
  Administered 2021-06-10: 2000 [IU]/h via INTRAVENOUS
  Administered 2021-06-10: 1800 [IU]/h via INTRAVENOUS
  Filled 2021-06-09 (×3): qty 250

## 2021-06-09 MED ORDER — VERAPAMIL HCL 2.5 MG/ML IV SOLN
INTRAVENOUS | Status: AC
  Filled 2021-06-09: qty 2

## 2021-06-09 MED ORDER — SODIUM CHLORIDE 0.9% FLUSH: 3.0000 mL | Freq: Two times a day (BID) | INTRAVENOUS | Status: DC

## 2021-06-09 MED ORDER — SODIUM CHLORIDE 0.9% FLUSH
3.0000 mL | Freq: Two times a day (BID) | INTRAVENOUS | Status: DC
  Administered 2021-06-10 – 2021-06-12 (×5): 3 mL via INTRAVENOUS

## 2021-06-09 SURGICAL SUPPLY — 9 items

## 2021-06-09 NOTE — Interval H&P Note (Signed)
History and Physical Interval Note:  06/09/2021 7:27 AM  [name]  has presented today for surgery, with the diagnosis of chest pain.  The various methods of treatment have been discussed with the patient and family. After consideration of risks, benefits and other options for treatment, the patient has consented to  Procedure(s): LEFT HEART CATH AND CORONARY ANGIOGRAPHY (N/A) as a surgical intervention.  The patient's history has been reviewed, patient examined, no change in status, stable for surgery.  I have reviewed the patient's chart and labs.  Questions were answered to the patient's satisfaction.   Cath Lab Visit (complete for each Cath Lab visit)  Clinical Evaluation Leading to the Procedure:   ACS: Yes.    Non-ACS:    Anginal Classification: CCS IV  Anti-ischemic medical therapy: Minimal Therapy (1 class of medications)  Non-Invasive Test Results: No non-invasive testing performed  Prior CABG: No previous CABG        [name] [facility] 06/09/2021 7:28 AM

## 2021-06-09 NOTE — Progress Notes (Signed)
[name]   65 y.o. male HTN DM HLD CKD3 followed by Dr. Carson Myrtle presenting to an outside ED with left sided substernal chest pain radiating down the left arm with negative myocardial perfusion stress test 04/2021. Increasing trend in troponins + heparin before being transferred to [facility]. His creatinine was 1.25 05/08/2020 with an episode of AKI a month later in 05/2020 with incr to the 2.5 range. Cr was 1.86 on 7/24 but has been steadily increasing.   Assessment/ Plan:   Renal failure with acute on CKD3 followed by [place] with h/o AKI 05/2020 which appears to have been secondary to an Abx being given for a toe infection and he was tole 3 weeks ago that his renal function had improved. His BL Cr appears to be in the 1.7-2 range and I attempted to call  [phone] to confirm. He appears to be euvolemic and I would hold off on diuresis as he appears to be headed for a cath tomorrow. Certainly at risk for AKI/ RRT with contrast and risk would increase with prerenal azotemia. - Fortunately renal function improved with gentle hydration + BP control and he was able to have a  cath today. 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  - Will evaluate again tomorrow to ensure renal function remains stable; 3 vessel disease and appears that plan is for PCI of the LCx/1st OM in a few days if renal function is stable. - Strict I&O's + daily weights. NSTEMI - on ASA/ statin/ nitro/ heparin/ BBlr and plan for cath tomorrow. HTN - uptitrating medical therapy. DM  Subjective:   No further CP. Denies dyspnea, fever, chills, nausea   Objective:   BP (!) 163/81   Pulse 75   Temp 98.9 F (37.2 C) (Oral)   Resp 15   Ht 6' (1.829 m)   Wt 95.9 kg   SpO2 97%   BMI 28.67 kg/m   Intake/Output Summary (Last 24 hours) at 06/09/2021 1116 Last data filed at 06/09/2021 1100 Gross per 24 hour  Intake 3214.45 ml  Output 825 ml  Net 2389.45 ml   Weight change: 0.645 kg  Physical  Exam: General appearance: alert, cooperative Head: NCAT Back: symmetric, no curvature. ROM normal. No CVA tenderness. Resp: clear to auscultation bilaterally Cardio: regular rate and rhythm GI: SNDNT + BS Extremities: edema none  Imaging: DG Chest 2 View  Result Date: 06/07/2021 CLINICAL DATA:  Chest pain. EXAM: CHEST - 2 VIEW COMPARISON:  05/31/2018 FINDINGS: The heart size and mediastinal contours are within normal limits. There is no evidence of pulmonary edema, consolidation, pneumothorax, nodule or pleural fluid. The visualized skeletal structures are unremarkable. IMPRESSION: No active cardiopulmonary disease. Electronically Signed   By: Aletta Edouard M.D.   On: 06/07/2021 14:38   CARDIAC CATHETERIZATION  Result Date: 06/09/2021 Formatting of this result is different from the original.   Ost LAD to Prox LAD lesion is 20% stenosed.   Mid LAD lesion is 50% stenosed.   Dist LAD lesion is 85% stenosed.   2nd Diag lesion is 90% stenosed.   1st Mrg lesion is 95% stenosed.   Ost Cx to Prox Cx lesion is 65% stenosed.   Dist Cx lesion is 90% stenosed.   Prox RCA lesion is 30% stenosed.   2nd RPL lesion is 90% stenosed.   LV end diastolic pressure is normal. 3 vessel obstructive CAD. The culprit lesion appears to be the ostial first OM which is a bifurcation lesion. He  also has significant small vessel/distal disease in the second diagonal, distal LAD, distal LCx and terminal PL branches. Normal LVEDP Plan: reviewed films with Dr Tamala Julian. I would recommend PCI of the LCx/first OM bifurcation. His other disease is not amenable to PCI or CABG and will be treated medically. Given his renal dysfunction will need to stage PCI. Anticipate bringing him back for PCI in 2 days if renal function is stable. Likely will require [place] technique to treat bifurcation. Will load with Brilinta.   ECHOCARDIOGRAM COMPLETE  Result Date: 06/08/2021    ECHOCARDIOGRAM REPORT   Patient Name:   [name] Date  of Exam: 06/08/2021 Medical Rec #:  [id number]            Height:       72.0 in Accession #:    [id number]           Weight:       210.0 lb Date of Birth:  Feb 23, 1956            BSA:          2.175 m Patient Age:    29 years             BP:           152/80 mmHg Patient Gender: M                    HR:           70 bpm. Exam Location:  Inpatient Procedure: 2D Echo, Cardiac Doppler, Color Doppler and Intracardiac            Opacification Agent Indications:    Acute myocardial infarction, unspecified I21.9  History:        Patient has no prior history of Echocardiogram examinations.                 Risk Factors:Hypertension, Diabetes and Dyslipidemia.  Sonographer:    Darlina Sicilian RDCS Referring Phys: [id number] [place]  1. Left ventricular ejection fraction, by estimation, is 60 to 65%. The left ventricle has normal function. The left ventricle has no regional wall motion abnormalities. There is mild left ventricular hypertrophy. Left ventricular diastolic parameters are consistent with Grade I diastolic dysfunction (impaired relaxation).  2. Right ventricular systolic function is normal. The right ventricular size is normal. There is normal pulmonary artery systolic pressure. The estimated right ventricular systolic pressure is 64.4 mmHg.  3. The mitral valve is abnormal. Trivial mitral valve regurgitation. Mild mitral stenosis. The mean mitral valve gradient is 6.0 mmHg. Moderate mitral annular calcification.  4. The aortic valve is tricuspid. Aortic valve regurgitation is not visualized. Mild aortic valve sclerosis is present, with no evidence of aortic valve stenosis.  5. The inferior vena cava is normal in size with greater than 50% respiratory variability, suggesting right atrial pressure of 3 mmHg. FINDINGS  Left Ventricle: Left ventricular ejection fraction, by estimation, is 60 to 65%. The left ventricle has normal function. The left ventricle has no regional wall motion abnormalities.  Definity contrast agent was given IV to delineate the left ventricular  endocardial borders. The left ventricular internal cavity size was normal in size. There is mild left ventricular hypertrophy. Left ventricular diastolic parameters are consistent with Grade I diastolic dysfunction (impaired relaxation). Right Ventricle: The right ventricular size is normal. No increase in right ventricular wall thickness. Right ventricular systolic function is normal. There is normal pulmonary artery systolic pressure. The tricuspid regurgitant  velocity is 2.78 m/s, and  with an assumed right atrial pressure of 3 mmHg, the estimated right ventricular systolic pressure is 37.9 mmHg. Left Atrium: Left atrial size was normal in size. Right Atrium: Right atrial size was normal in size. Pericardium: Trivial pericardial effusion is present. Mitral Valve: The mitral valve is abnormal. There is mild calcification of the mitral valve leaflet(s). Moderate mitral annular calcification. Trivial mitral valve regurgitation. Mild mitral valve stenosis. MV peak gradient, 12.2 mmHg. The mean mitral valve gradient is 6.0 mmHg. Tricuspid Valve: The tricuspid valve is normal in structure. Tricuspid valve regurgitation is trivial. Aortic Valve: The aortic valve is tricuspid. Aortic valve regurgitation is not visualized. Mild aortic valve sclerosis is present, with no evidence of aortic valve stenosis. Pulmonic Valve: The pulmonic valve was normal in structure. Pulmonic valve regurgitation is trivial. Aorta: The aortic root is normal in size and structure. Venous: The inferior vena cava is normal in size with greater than 50% respiratory variability, suggesting right atrial pressure of 3 mmHg. IAS/Shunts: No atrial level shunt detected by color flow Doppler.  LEFT VENTRICLE PLAX 2D LVIDd:         4.70 cm      Diastology LVIDs:         3.30 cm      LV e' medial:    4.64 cm/s LV PW:         1.10 cm      LV E/e' medial:  31.9 LV IVS:        1.30 cm       LV e' lateral:   9.52 cm/s LVOT diam:     2.20 cm      LV E/e' lateral: 15.5 LV SV:         87 LV SV Index:   40 LVOT Area:     3.80 cm  LV Volumes (MOD) LV vol d, MOD A2C: 136.0 ml LV vol d, MOD A4C: 118.0 ml LV vol s, MOD A2C: 59.0 ml LV vol s, MOD A4C: 51.9 ml LV SV MOD A2C:     77.0 ml LV SV MOD A4C:     118.0 ml LV SV MOD BP:      68.6 ml RIGHT VENTRICLE RV S prime:     12.90 cm/s TAPSE (M-mode): 1.7 cm LEFT ATRIUM             Index       RIGHT ATRIUM           Index LA diam:        4.20 cm 1.93 cm/m  RA Area:     14.00 cm LA Vol (A2C):   61.7 ml 28.37 ml/m RA Volume:   31.20 ml  14.34 ml/m LA Vol (A4C):   57.8 ml 26.57 ml/m LA Biplane Vol: 59.1 ml 27.17 ml/m  AORTIC VALVE LVOT Vmax:   106.00 cm/s LVOT Vmean:  71.900 cm/s LVOT VTI:    0.229 m  AORTA Ao Root diam: 3.10 cm Ao Asc diam:  2.90 cm MITRAL VALVE                TRICUSPID VALVE MV Area (PHT): 3.74 cm     TR Peak grad:   30.9 mmHg MV Area VTI:   1.54 cm     TR Vmax:        278.00 cm/s MV Peak grad:  12.2 mmHg MV Mean grad:  6.0 mmHg     SHUNTS MV Vmax:  1.75 m/s     Systemic VTI:  0.23 m MV Vmean:      117.0 cm/s   Systemic Diam: 2.20 cm MV Decel Time: 203 msec MV E velocity: 148.00 cm/s MV A velocity: 149.00 cm/s MV E/A ratio:  0.99 Loralie Champagne MD Electronically signed by Loralie Champagne MD Signature Date/Time: 06/08/2021/3:46:25 PM    Final     Labs: BMET Recent Labs  Lab 06/07/21 1225 06/08/21 0104 06/08/21 0725 06/09/21 0504  NA 136 136 135 137  K 3.3* 3.3* 3.3* 3.6  CL 105 105 104 107  CO2 22 24 25 25   GLUCOSE 238* 219* 218* 145*  BUN 25* 22 25* 19  CREATININE 1.86* 2.19* 2.23* 1.95*  CALCIUM 8.9 8.6* 8.6* 8.2*   CBC Recent Labs  Lab 06/07/21 1225 06/08/21 0104 06/09/21 0220  WBC 11.4* 12.4* 12.1*  NEUTROABS 8.3*  --   --   HGB 13.5 11.1* 10.5*  HCT 37.9* 31.8* 29.5*  MCV 83.8 83.5 85.3  PLT 212 215 191    Medications:     aspirin EC  81 mg Oral Daily   atorvastatin  80 mg Oral Daily   carvedilol   6.25 mg Oral BID WC   Chlorhexidine Gluconate Cloth  6 each Topical Daily   gabapentin  100 mg Oral BID   insulin aspart  0-9 Units Subcutaneous TID AC & HS   sodium chloride flush  3 mL Intravenous Q12H   sodium chloride flush  3 mL Intravenous Q12H   tamsulosin  0.4 mg Oral QPC supper   ticagrelor  90 mg Oral BID      Otelia Santee, MD 06/09/2021, 11:16 AM

## 2021-06-09 NOTE — Progress Notes (Signed)
[address] for Heparin Indication: chest pain/ACS  HEPARIN DW (KG): 95.3   Labs: Recent Labs    06/07/21 1225 06/07/21 1535 06/07/21 2035 06/08/21 0104 06/08/21 0725 06/08/21 1054 06/08/21 1449 06/09/21 0220 06/09/21 0504  HGB 13.5  --   --  11.1*  --   --   --  10.5*  --   HCT 37.9*  --   --  31.8*  --   --   --  29.5*  --   PLT 212  --   --  215  --   --   --  191  --   LABPROT  --   --   --  14.1  --   --   --   --   --   INR  --   --   --  1.1  --   --   --   --   --   HEPARINUNFRC  --   --    < > 0.13*  --   --  0.30 0.36  --   CREATININE 1.86*  --   --  2.19* 2.23*  --   --   --  1.95*  TROPONINIHS 173* 845*  --   --  10,056* 8,206*  --   --   --    < > = values in this interval not displayed.    Medications:  See med rec  Assessment: Patient presented to ED with left sided chest pain and radiating down his arm. Troponins elevated. Pharmacy asked to dose heparin.   Heparin level was therapeutic this AM at 0.36 on 1800 units/hr.  No overt bleeding or complications noted.  Heparin then turned off for cath lab.  Pharmacy asked to resume IV heparin 8 hrs after sheath out (pulled ~ 8 AM).  Planning staged PCI on 7/28.  Goal of Therapy:  Heparin level 0.3-0.7 units/ml Monitor platelets by anticoagulation protocol: Yes   Plan:  Resume IV heparin at 1800 units/hr at 1600 pm today. Check heparin level 8 hrs after gtt restarts. Daily heparin level and CBC.  [place] Crane, Roylene Reason, BCCP Clinical Pharmacist  06/09/2021 9:13 AM   [facility] pharmacy phone numbers are listed on [email or web address]

## 2021-06-09 NOTE — Progress Notes (Signed)
Paged regarding BP 159/76 (99), HR 70s. Uptitrate coreg to 12.5 mg PO bid (known multivessel CAD and HR ok), on multiple HTN meds at home. Will likely need to restart those stepwise, holding off on home ARB given AKI and need for repeat cath. Off home clonidine and amlodipine.

## 2021-06-09 NOTE — Progress Notes (Signed)
Progress Note  Patient Name: [name] Date of Encounter: 06/09/2021  [facility] Cardiologist: Minus Breeding, MD   Subjective   Already in cath lab.No problems over night. Creat improved with hydration.  Inpatient Medications    Scheduled Meds:  aspirin EC  81 mg Oral Daily   atorvastatin  80 mg Oral Daily   carvedilol  6.25 mg Oral BID WC   Chlorhexidine Gluconate Cloth  6 each Topical Daily   gabapentin  100 mg Oral BID   insulin aspart  0-9 Units Subcutaneous TID AC & HS   sodium chloride flush  3 mL Intravenous Q12H   tamsulosin  0.4 mg Oral QPC supper   Continuous Infusions:  sodium chloride 1 mL/kg/hr (06/09/21 0227)   heparin 1,800 Units/hr (06/09/21 0700)   nitroGLYCERIN 75 mcg/min (06/09/21 0700)   PRN Meds: acetaminophen, ondansetron (ZOFRAN) IV   Vital Signs    Vitals:   06/09/21 0400 06/09/21 0500 06/09/21 0600 06/09/21 0700  BP: (!) 155/82 (!) 168/85 (!) 158/86   Pulse: 71 77 74 72  Resp: 13 16 16 14   Temp:      TempSrc:      SpO2: 96% 96% 96% 97%  Weight:  95.9 kg    Height:        Intake/Output Summary (Last 24 hours) at 06/09/2021 0736 Last data filed at 06/09/2021 0700 Gross per 24 hour  Intake 3262.22 ml  Output 900 ml  Net 2362.22 ml   Last 3 Weights 06/09/2021 06/07/2021 04/08/2021  Weight (lbs) 211 lb 6.7 oz 210 lb 205 lb  Weight (kg) 95.9 kg 95.255 kg 92.987 kg      Telemetry    NSR - Personally Reviewed  ECG    No new data - Personally Reviewed  Physical Exam  Unchanged from yesterday GEN: No acute distress.   Neck: No JVD Cardiac: RRR, no murmurs, rubs, or gallops.  Respiratory: Clear to auscultation bilaterally. GI: Soft, nontender, non-distended  MS: No edema; No deformity. Neuro:  Nonfocal  Psych: Normal affect   Labs    High Sensitivity Troponin:   Recent Labs  Lab 06/07/21 1225 06/07/21 1535 06/08/21 0725 06/08/21 1054  TROPONINIHS 173* 845* 10,056* 8,206*      Chemistry Recent Labs   Lab 06/07/21 1225 06/08/21 0104 06/08/21 0725 06/09/21 0504  NA 136 136 135 137  K 3.3* 3.3* 3.3* 3.6  CL 105 105 104 107  CO2 22 24 25 25   GLUCOSE 238* 219* 218* 145*  BUN 25* 22 25* 19  CREATININE 1.86* 2.19* 2.23* 1.95*  CALCIUM 8.9 8.6* 8.6* 8.2*  PROT 6.8  --   --   --   ALBUMIN 3.7  --   --   --   AST 22  --   --   --   ALT 15  --   --   --   ALKPHOS 93  --   --   --   BILITOT 0.7  --   --   --   GFRNONAA 39* 32* 32* 37*  ANIONGAP 9 7 6 5      Hematology Recent Labs  Lab 06/07/21 1225 06/08/21 0104 06/09/21 0220  WBC 11.4* 12.4* 12.1*  RBC 4.52 3.81* 3.46*  HGB 13.5 11.1* 10.5*  HCT 37.9* 31.8* 29.5*  MCV 83.8 83.5 85.3  MCH 29.9 29.1 30.3  MCHC 35.6 34.9 35.6  RDW 13.6 13.6 13.7  PLT 212 215 191    BNP Recent Labs  Lab  06/08/21 0724  BNP 217.1*     DDimer No results for input(s): DDIMER in the last 168 hours.   Radiology    DG Chest 2 View  Result Date: 06/07/2021 CLINICAL DATA:  Chest pain. EXAM: CHEST - 2 VIEW COMPARISON:  05/31/2018 FINDINGS: The heart size and mediastinal contours are within normal limits. There is no evidence of pulmonary edema, consolidation, pneumothorax, nodule or pleural fluid. The visualized skeletal structures are unremarkable. IMPRESSION: No active cardiopulmonary disease. Electronically Signed   By: Aletta Edouard M.D.   On: 06/07/2021 14:38   ECHOCARDIOGRAM COMPLETE  Result Date: 06/08/2021    ECHOCARDIOGRAM REPORT   Patient Name:   [name] Date of Exam: 06/08/2021 Medical Rec #:  [id number]            Height:       72.0 in Accession #:    [id number]           Weight:       210.0 lb Date of Birth:  1956-06-19            BSA:          2.175 m Patient Age:    65 years             BP:           152/80 mmHg Patient Gender: M                    HR:           70 bpm. Exam Location:  Inpatient Procedure: 2D Echo, Cardiac Doppler, Color Doppler and Intracardiac            Opacification Agent Indications:    Acute  myocardial infarction, unspecified I21.9  History:        Patient has no prior history of Echocardiogram examinations.                 Risk Factors:Hypertension, Diabetes and Dyslipidemia.  Sonographer:    Darlina Sicilian RDCS Referring Phys: [id number] [place]  1. Left ventricular ejection fraction, by estimation, is 60 to 65%. The left ventricle has normal function. The left ventricle has no regional wall motion abnormalities. There is mild left ventricular hypertrophy. Left ventricular diastolic parameters are consistent with Grade I diastolic dysfunction (impaired relaxation).  2. Right ventricular systolic function is normal. The right ventricular size is normal. There is normal pulmonary artery systolic pressure. The estimated right ventricular systolic pressure is 93.7 mmHg.  3. The mitral valve is abnormal. Trivial mitral valve regurgitation. Mild mitral stenosis. The mean mitral valve gradient is 6.0 mmHg. Moderate mitral annular calcification.  4. The aortic valve is tricuspid. Aortic valve regurgitation is not visualized. Mild aortic valve sclerosis is present, with no evidence of aortic valve stenosis.  5. The inferior vena cava is normal in size with greater than 50% respiratory variability, suggesting right atrial pressure of 3 mmHg. FINDINGS  Left Ventricle: Left ventricular ejection fraction, by estimation, is 60 to 65%. The left ventricle has normal function. The left ventricle has no regional wall motion abnormalities. Definity contrast agent was given IV to delineate the left ventricular  endocardial borders. The left ventricular internal cavity size was normal in size. There is mild left ventricular hypertrophy. Left ventricular diastolic parameters are consistent with Grade I diastolic dysfunction (impaired relaxation). Right Ventricle: The right ventricular size is normal. No increase in right ventricular wall thickness. Right ventricular systolic function is normal.  There is  normal pulmonary artery systolic pressure. The tricuspid regurgitant velocity is 2.78 m/s, and  with an assumed right atrial pressure of 3 mmHg, the estimated right ventricular systolic pressure is 66.5 mmHg. Left Atrium: Left atrial size was normal in size. Right Atrium: Right atrial size was normal in size. Pericardium: Trivial pericardial effusion is present. Mitral Valve: The mitral valve is abnormal. There is mild calcification of the mitral valve leaflet(s). Moderate mitral annular calcification. Trivial mitral valve regurgitation. Mild mitral valve stenosis. MV peak gradient, 12.2 mmHg. The mean mitral valve gradient is 6.0 mmHg. Tricuspid Valve: The tricuspid valve is normal in structure. Tricuspid valve regurgitation is trivial. Aortic Valve: The aortic valve is tricuspid. Aortic valve regurgitation is not visualized. Mild aortic valve sclerosis is present, with no evidence of aortic valve stenosis. Pulmonic Valve: The pulmonic valve was normal in structure. Pulmonic valve regurgitation is trivial. Aorta: The aortic root is normal in size and structure. Venous: The inferior vena cava is normal in size with greater than 50% respiratory variability, suggesting right atrial pressure of 3 mmHg. IAS/Shunts: No atrial level shunt detected by color flow Doppler.  LEFT VENTRICLE PLAX 2D LVIDd:         4.70 cm      Diastology LVIDs:         3.30 cm      LV e' medial:    4.64 cm/s LV PW:         1.10 cm      LV E/e' medial:  31.9 LV IVS:        1.30 cm      LV e' lateral:   9.52 cm/s LVOT diam:     2.20 cm      LV E/e' lateral: 15.5 LV SV:         87 LV SV Index:   40 LVOT Area:     3.80 cm  LV Volumes (MOD) LV vol d, MOD A2C: 136.0 ml LV vol d, MOD A4C: 118.0 ml LV vol s, MOD A2C: 59.0 ml LV vol s, MOD A4C: 51.9 ml LV SV MOD A2C:     77.0 ml LV SV MOD A4C:     118.0 ml LV SV MOD BP:      68.6 ml RIGHT VENTRICLE RV S prime:     12.90 cm/s TAPSE (M-mode): 1.7 cm LEFT ATRIUM             Index       RIGHT ATRIUM            Index LA diam:        4.20 cm 1.93 cm/m  RA Area:     14.00 cm LA Vol (A2C):   61.7 ml 28.37 ml/m RA Volume:   31.20 ml  14.34 ml/m LA Vol (A4C):   57.8 ml 26.57 ml/m LA Biplane Vol: 59.1 ml 27.17 ml/m  AORTIC VALVE LVOT Vmax:   106.00 cm/s LVOT Vmean:  71.900 cm/s LVOT VTI:    0.229 m  AORTA Ao Root diam: 3.10 cm Ao Asc diam:  2.90 cm MITRAL VALVE                TRICUSPID VALVE MV Area (PHT): 3.74 cm     TR Peak grad:   30.9 mmHg MV Area VTI:   1.54 cm     TR Vmax:        278.00 cm/s MV Peak grad:  12.2 mmHg MV Mean grad:  6.0  mmHg     SHUNTS MV Vmax:       1.75 m/s     Systemic VTI:  0.23 m MV Vmean:      117.0 cm/s   Systemic Diam: 2.20 cm MV Decel Time: 203 msec MV E velocity: 148.00 cm/s MV A velocity: 149.00 cm/s MV E/A ratio:  0.99 Loralie Champagne MD Electronically signed by Loralie Champagne MD Signature Date/Time: 06/08/2021/3:46:25 PM    Final     Cardiac Studies   See echo above. EF 60%  Patient Profile     65 y.o. male with primary hypertension, type 2 diabetes, [name]-b, normal LVEF, and non-ST elevation MI with symptoms developing over the 72 hours prior to admission.  Assessment & Plan    NSTEMI: for cath today to define anatomy and therapy. CKD III-b: Slight improvement with hydration DM II: Add SGLT-2 Primary hypertension: Management compromised by CKD. ? ARB at some point. Care with diuretic therapy. Amlodipine and clonidine now. Diuretic therapy when kidney function stable. Will confer with Nephrology. Lipids: High intensity statin  For questions or updates, please contact [place] Please consult [email or web address] for contact info under        Signed, Sinclair Grooms, MD  06/09/2021, 7:36 AM

## 2021-06-10 DIAGNOSIS — N179 Acute kidney failure, unspecified: Secondary | ICD-10-CM

## 2021-06-10 DIAGNOSIS — I25110 Atherosclerotic heart disease of native coronary artery with unstable angina pectoris: Secondary | ICD-10-CM

## 2021-06-10 LAB — GLUCOSE, CAPILLARY
Glucose-Capillary: 176 mg/dL — ABNORMAL HIGH (ref 70–99)
Glucose-Capillary: 262 mg/dL — ABNORMAL HIGH (ref 70–99)
Glucose-Capillary: 205 mg/dL — ABNORMAL HIGH (ref 70–99)
Glucose-Capillary: 193 mg/dL — ABNORMAL HIGH (ref 70–99)

## 2021-06-10 LAB — BASIC METABOLIC PANEL
Anion gap: 5 (ref 5–15)
BUN: 14 mg/dL (ref 8–23)
CO2: 24 mmol/L (ref 22–32)
Calcium: 8 mg/dL — ABNORMAL LOW (ref 8.9–10.3)
Chloride: 104 mmol/L (ref 98–111)
Creatinine, Ser: 1.87 mg/dL — ABNORMAL HIGH (ref 0.61–1.24)
GFR, Estimated: 39 mL/min — ABNORMAL LOW (ref >60)
Glucose, Bld: 241 mg/dL — ABNORMAL HIGH (ref 70–99)
Potassium: 3.4 mmol/L — ABNORMAL LOW (ref 3.5–5.1)
Sodium: 133 mmol/L — ABNORMAL LOW (ref 135–145)

## 2021-06-10 LAB — CBC
HCT: 29.5 % — ABNORMAL LOW (ref 39.0–52.0)
Hemoglobin: 10.2 g/dL — ABNORMAL LOW (ref 13.0–17.0)
MCH: 29.4 pg (ref 26.0–34.0)
MCHC: 34.6 g/dL (ref 30.0–36.0)
MCV: 85 fL (ref 80.0–100.0)
Platelets: 194 10*3/uL (ref 150–400)
RBC: 3.47 MIL/uL — ABNORMAL LOW (ref 4.22–5.81)
RDW: 13.4 % (ref 11.5–15.5)
WBC: 12.3 10*3/uL — ABNORMAL HIGH (ref 4.0–10.5)
nRBC: 0 % (ref 0.0–0.2)

## 2021-06-10 LAB — HEPARIN LEVEL (UNFRACTIONATED)
Heparin Unfractionated: 0.25 IU/mL — ABNORMAL LOW (ref 0.30–0.70)
Heparin Unfractionated: 0.39 IU/mL (ref 0.30–0.70)

## 2021-06-10 MED ORDER — FUROSEMIDE 10 MG/ML IJ SOLN: 40.0000 mg | Freq: Once | INTRAMUSCULAR | Status: DC

## 2021-06-10 MED ORDER — POTASSIUM CHLORIDE CRYS ER 20 MEQ PO TBCR
40.0000 meq | EXTENDED_RELEASE_TABLET | Freq: Once | ORAL | Status: AC
  Administered 2021-06-10: 40 meq via ORAL
  Filled 2021-06-10: qty 2

## 2021-06-10 MED ORDER — TICAGRELOR 90 MG PO TABS
90.0000 mg | ORAL_TABLET | ORAL | Status: AC
  Administered 2021-06-11: 90 mg via ORAL
  Filled 2021-06-10: qty 1

## 2021-06-10 MED ORDER — SODIUM CHLORIDE 0.9 % WEIGHT BASED INFUSION
1.0000 mL/kg/h | INTRAVENOUS | Status: DC
  Administered 2021-06-10 – 2021-06-11 (×2): 1 mL/kg/h via INTRAVENOUS

## 2021-06-10 MED ORDER — POTASSIUM CHLORIDE CRYS ER 20 MEQ PO TBCR
20.0000 meq | EXTENDED_RELEASE_TABLET | Freq: Once | ORAL | Status: AC
  Administered 2021-06-10: 20 meq via ORAL
  Filled 2021-06-10: qty 1

## 2021-06-10 MED ORDER — SODIUM CHLORIDE 0.9% FLUSH: 3.0000 mL | Freq: Two times a day (BID) | INTRAVENOUS | Status: DC

## 2021-06-10 MED ORDER — CLONIDINE HCL 0.2 MG PO TABS
0.2000 mg | ORAL_TABLET | Freq: Two times a day (BID) | ORAL | Status: DC
  Administered 2021-06-10 (×2): 0.2 mg via ORAL
  Filled 2021-06-10 (×2): qty 1

## 2021-06-10 MED ORDER — SODIUM CHLORIDE 0.9% FLUSH: 3.0000 mL | INTRAVENOUS | Status: DC | PRN

## 2021-06-10 MED ORDER — FUROSEMIDE 10 MG/ML IJ SOLN
40.0000 mg | Freq: Once | INTRAMUSCULAR | Status: AC
  Administered 2021-06-10: 40 mg via INTRAVENOUS
  Filled 2021-06-10: qty 4

## 2021-06-10 MED ORDER — SODIUM CHLORIDE 0.9 % IV SOLN: 250.0000 mL | INTRAVENOUS | Status: DC | PRN

## 2021-06-10 MED ORDER — ASPIRIN 81 MG PO CHEW
81.0000 mg | CHEWABLE_TABLET | ORAL | Status: AC
  Administered 2021-06-11: 81 mg via ORAL
  Filled 2021-06-10: qty 1

## 2021-06-10 MED ORDER — CLONIDINE HCL 0.2 MG PO TABS: 0.2000 mg | ORAL_TABLET | Freq: Two times a day (BID) | ORAL | Status: DC

## 2021-06-10 MED FILL — Verapamil HCl IV Soln 2.5 MG/ML: INTRAVENOUS | Qty: 2 | Status: AC

## 2021-06-10 NOTE — Care Management (Signed)
1140 06-10-21 Benefits check submitted for Brilinta. Case Manager will follow for cost.

## 2021-06-10 NOTE — Progress Notes (Signed)
Progress Note  Patient Name: [name] Date of Encounter: 06/10/2021  [facility] Cardiologist: Minus Breeding, MD   Subjective   Cardiac cath yesterday demonstrated diffuse three-vessel disease but with suspected culprit being the circumflex/diagonal [place] 1, 1, 1 stenosis.  Significant difficulty with blood pressure since admission.  Identified last night that clonidine was not continued upon admission and was restarted.  No recurrence of chest discomfort.  Creatinine is pending.  Inpatient Medications    Scheduled Meds:  aspirin EC  81 mg Oral Daily   atorvastatin  80 mg Oral Daily   carvedilol  12.5 mg Oral BID WC   Chlorhexidine Gluconate Cloth  6 each Topical Daily   [START ON 06/11/2021] cloNIDine  0.2 mg Oral BID   gabapentin  100 mg Oral BID   insulin aspart  0-9 Units Subcutaneous TID AC & HS   sodium chloride flush  3 mL Intravenous Q12H   sodium chloride flush  3 mL Intravenous Q12H   tamsulosin  0.4 mg Oral QPC supper   ticagrelor  90 mg Oral BID   Continuous Infusions:  sodium chloride     heparin 2,000 Units/hr (06/10/21 0700)   nitroGLYCERIN 175 mcg/min (06/10/21 0700)   PRN Meds: sodium chloride, acetaminophen, ondansetron (ZOFRAN) IV, ondansetron (ZOFRAN) IV, sodium chloride flush   Vital Signs    Vitals:   06/10/21 0500 06/10/21 0600 06/10/21 0700 06/10/21 0727  BP: (!) 154/79 (!) 166/75 (!) 169/73   Pulse: 68 71 72   Resp: 15 12 10    Temp:    98.3 F (36.8 C)  TempSrc:    Oral  SpO2: 98% 99% 100%   Weight: 97.4 kg     Height:        Intake/Output Summary (Last 24 hours) at 06/10/2021 0809 Last data filed at 06/10/2021 0700 Gross per 24 hour  Intake 1968.81 ml  Output 800 ml  Net 1168.81 ml   Last 3 Weights 06/10/2021 06/09/2021 06/07/2021  Weight (lbs) 214 lb 11.7 oz 211 lb 6.7 oz 210 lb  Weight (kg) 97.4 kg 95.9 kg 95.255 kg      Telemetry    NSR - Personally Reviewed  ECG    No new data - Personally  Reviewed  Physical Exam  Unchanged from yesterday GEN: No acute distress.   Neck: No JVD Cardiac: RRR, no murmurs, rubs, or gallops.  Radial cath site is unremarkable. Respiratory: Clear to auscultation bilaterally. GI: Soft, nontender, non-distended  MS: No edema; No deformity. Neuro:  Nonfocal  Psych: Normal affect   Labs    High Sensitivity Troponin:   Recent Labs  Lab 06/07/21 1225 06/07/21 1535 06/08/21 0725 06/08/21 1054  TROPONINIHS 173* 845* 10,056* 8,206*      Chemistry Recent Labs  Lab 06/07/21 1225 06/08/21 0104 06/08/21 0725 06/09/21 0504 06/09/21 0943  NA 136   < > 135 137 136  K 3.3*   < > 3.3* 3.6 3.7  CL 105   < > 104 107 108  CO2 22   < > 25 25 24   GLUCOSE 238*   < > 218* 145* 133*  BUN 25*   < > 25* 19 17  CREATININE 1.86*   < > 2.23* 1.95* 1.84*  CALCIUM 8.9   < > 8.6* 8.2* 8.2*  PROT 6.8  --   --   --   --   ALBUMIN 3.7  --   --   --   --   AST 22  --   --   --   --  ALT 15  --   --   --   --   ALKPHOS 93  --   --   --   --   BILITOT 0.7  --   --   --   --   GFRNONAA 39*   < > 32* 37* 40*  ANIONGAP 9   < > 6 5 4*   < > = values in this interval not displayed.     Hematology Recent Labs  Lab 06/08/21 0104 06/09/21 0220 06/10/21 0106  WBC 12.4* 12.1* 12.3*  RBC 3.81* 3.46* 3.47*  HGB 11.1* 10.5* 10.2*  HCT 31.8* 29.5* 29.5*  MCV 83.5 85.3 85.0  MCH 29.1 30.3 29.4  MCHC 34.9 35.6 34.6  RDW 13.6 13.7 13.4  PLT 215 191 194    BNP Recent Labs  Lab 06/08/21 0724  BNP 217.1*     DDimer No results for input(s): DDIMER in the last 168 hours.   Radiology    CARDIAC CATHETERIZATION  Result Date: 06/09/2021 Formatting of this result is different from the original.   Ost LAD to Prox LAD lesion is 20% stenosed.   Mid LAD lesion is 50% stenosed.   Dist LAD lesion is 85% stenosed.   2nd Diag lesion is 90% stenosed.   1st Mrg lesion is 95% stenosed.   Ost Cx to Prox Cx lesion is 65% stenosed.   Dist Cx lesion is 90% stenosed.   Prox  RCA lesion is 30% stenosed.   2nd RPL lesion is 90% stenosed.   LV end diastolic pressure is normal. 3 vessel obstructive CAD. The culprit lesion appears to be the ostial first OM which is a bifurcation lesion. He also has significant small vessel/distal disease in the second diagonal, distal LAD, distal LCx and terminal PL branches. Normal LVEDP Plan: reviewed films with Dr Tamala Julian. I would recommend PCI of the LCx/first OM bifurcation. His other disease is not amenable to PCI or CABG and will be treated medically. Given his renal dysfunction will need to stage PCI. Anticipate bringing him back for PCI in 2 days if renal function is stable. Likely will require Cullotte technique to treat bifurcation. Will load with Brilinta.   ECHOCARDIOGRAM COMPLETE  Result Date: 06/08/2021    ECHOCARDIOGRAM REPORT   Patient Name:   [name] Date of Exam: 06/08/2021 Medical Rec #:  [id number]            Height:       72.0 in Accession #:    [id number]           Weight:       210.0 lb Date of Birth:  [birth year]            BSA:          2.175 m Patient Age:    65 years             BP:           152/80 mmHg Patient Gender: M                    HR:           70 bpm. Exam Location:  Inpatient Procedure: 2D Echo, Cardiac Doppler, Color Doppler and Intracardiac            Opacification Agent Indications:    Acute myocardial infarction, unspecified I21.9  History:        Patient has no prior history of Echocardiogram examinations.  Risk Factors:Hypertension, Diabetes and Dyslipidemia.  Sonographer:    Darlina Sicilian RDCS Referring Phys: [id number] [place]  1. Left ventricular ejection fraction, by estimation, is 60 to 65%. The left ventricle has normal function. The left ventricle has no regional wall motion abnormalities. There is mild left ventricular hypertrophy. Left ventricular diastolic parameters are consistent with Grade I diastolic dysfunction (impaired relaxation).  2. Right  ventricular systolic function is normal. The right ventricular size is normal. There is normal pulmonary artery systolic pressure. The estimated right ventricular systolic pressure is 45.4 mmHg.  3. The mitral valve is abnormal. Trivial mitral valve regurgitation. Mild mitral stenosis. The mean mitral valve gradient is 6.0 mmHg. Moderate mitral annular calcification.  4. The aortic valve is tricuspid. Aortic valve regurgitation is not visualized. Mild aortic valve sclerosis is present, with no evidence of aortic valve stenosis.  5. The inferior vena cava is normal in size with greater than 50% respiratory variability, suggesting right atrial pressure of 3 mmHg. FINDINGS  Left Ventricle: Left ventricular ejection fraction, by estimation, is 60 to 65%. The left ventricle has normal function. The left ventricle has no regional wall motion abnormalities. Definity contrast agent was given IV to delineate the left ventricular  endocardial borders. The left ventricular internal cavity size was normal in size. There is mild left ventricular hypertrophy. Left ventricular diastolic parameters are consistent with Grade I diastolic dysfunction (impaired relaxation). Right Ventricle: The right ventricular size is normal. No increase in right ventricular wall thickness. Right ventricular systolic function is normal. There is normal pulmonary artery systolic pressure. The tricuspid regurgitant velocity is 2.78 m/s, and  with an assumed right atrial pressure of 3 mmHg, the estimated right ventricular systolic pressure is 09.8 mmHg. Left Atrium: Left atrial size was normal in size. Right Atrium: Right atrial size was normal in size. Pericardium: Trivial pericardial effusion is present. Mitral Valve: The mitral valve is abnormal. There is mild calcification of the mitral valve leaflet(s). Moderate mitral annular calcification. Trivial mitral valve regurgitation. Mild mitral valve stenosis. MV peak gradient, 12.2 mmHg. The mean mitral  valve gradient is 6.0 mmHg. Tricuspid Valve: The tricuspid valve is normal in structure. Tricuspid valve regurgitation is trivial. Aortic Valve: The aortic valve is tricuspid. Aortic valve regurgitation is not visualized. Mild aortic valve sclerosis is present, with no evidence of aortic valve stenosis. Pulmonic Valve: The pulmonic valve was normal in structure. Pulmonic valve regurgitation is trivial. Aorta: The aortic root is normal in size and structure. Venous: The inferior vena cava is normal in size with greater than 50% respiratory variability, suggesting right atrial pressure of 3 mmHg. IAS/Shunts: No atrial level shunt detected by color flow Doppler.  LEFT VENTRICLE PLAX 2D LVIDd:         4.70 cm      Diastology LVIDs:         3.30 cm      LV e' medial:    4.64 cm/s LV PW:         1.10 cm      LV E/e' medial:  31.9 LV IVS:        1.30 cm      LV e' lateral:   9.52 cm/s LVOT diam:     2.20 cm      LV E/e' lateral: 15.5 LV SV:         87 LV SV Index:   40 LVOT Area:     3.80 cm  LV Volumes (MOD) LV vol  d, MOD A2C: 136.0 ml LV vol d, MOD A4C: 118.0 ml LV vol s, MOD A2C: 59.0 ml LV vol s, MOD A4C: 51.9 ml LV SV MOD A2C:     77.0 ml LV SV MOD A4C:     118.0 ml LV SV MOD BP:      68.6 ml RIGHT VENTRICLE RV S prime:     12.90 cm/s TAPSE (M-mode): 1.7 cm LEFT ATRIUM             Index       RIGHT ATRIUM           Index LA diam:        4.20 cm 1.93 cm/m  RA Area:     14.00 cm LA Vol (A2C):   61.7 ml 28.37 ml/m RA Volume:   31.20 ml  14.34 ml/m LA Vol (A4C):   57.8 ml 26.57 ml/m LA Biplane Vol: 59.1 ml 27.17 ml/m  AORTIC VALVE LVOT Vmax:   106.00 cm/s LVOT Vmean:  71.900 cm/s LVOT VTI:    0.229 m  AORTA Ao Root diam: 3.10 cm Ao Asc diam:  2.90 cm MITRAL VALVE                TRICUSPID VALVE MV Area (PHT): 3.74 cm     TR Peak grad:   30.9 mmHg MV Area VTI:   1.54 cm     TR Vmax:        278.00 cm/s MV Peak grad:  12.2 mmHg MV Mean grad:  6.0 mmHg     SHUNTS MV Vmax:       1.75 m/s     Systemic VTI:  0.23 m MV  Vmean:      117.0 cm/s   Systemic Diam: 2.20 cm MV Decel Time: 203 msec MV E velocity: 148.00 cm/s MV A velocity: 149.00 cm/s MV E/A ratio:  0.99 Loralie Champagne MD Electronically signed by Loralie Champagne MD Signature Date/Time: 06/08/2021/3:46:25 PM    Final     Cardiac Studies   See echo above. EF 60%  Patient Profile     65 y.o. male with primary hypertension, type 2 diabetes, [name]-b, normal LVEF, and non-ST elevation MI with symptoms developing over the 72 hours prior to admission.  Assessment & Plan    NSTEMI: Suspect related to bifurcation disease in the proximal circumflex/OM1 territory.  Plan complex angioplasty tomorrow assuming stable kidney function.  Orders are pending kidney function assessment CKD III-b: Creatinine is pending DM II: 6.5 on admission.  Will need therapy, possibly with SGLT2 Primary hypertension: Reinstituting clonidine.  Continue carvedilol.  Watch for bradycardia.  Continue IV nitroglycerin to help control blood pressure.  Consider bilateral renal artery Doppler study to exclude renal artery stenosis. Lipids: High intensity statin  For questions or updates, please contact [place] Please consult [email or web address] for contact info under        Signed, Sinclair Grooms, MD  06/10/2021, 8:09 AM

## 2021-06-10 NOTE — H&P (View-Only) (Signed)
Complaining of some dyspnea.  Kidney function stable. Will give 1 dose of IV Lasix, 40 mg as recommended by nephrology. Will resume hydration this evening in preparation for PCI tomorrow.

## 2021-06-10 NOTE — Progress Notes (Signed)
ANTICOAGULATION CONSULT NOTE - Follow Up Consult  Pharmacy Consult for heparin Indication:  NSTEMI awaiting staged PCI   Labs: Recent Labs    06/07/21 1535 06/07/21 2035 06/08/21 0104 06/08/21 0725 06/08/21 1054 06/08/21 1449 06/09/21 0220 06/09/21 0504 06/09/21 0943 06/10/21 0106  HGB  --   --  11.1*  --   --   --  10.5*  --   --  10.2*  HCT  --   --  31.8*  --   --   --  29.5*  --   --  29.5*  PLT  --   --  215  --   --   --  191  --   --  194  LABPROT  --   --  14.1  --   --   --   --   --   --   --   INR  --   --  1.1  --   --   --   --   --   --   --   HEPARINUNFRC  --    < > 0.13*  --   --  0.30 0.36  --   --  0.25*  CREATININE  --   --  2.19* 2.23*  --   --   --  1.95* 1.84*  --   TROPONINIHS 845*  --   --  10,056* 8,206*  --   --   --   --   --    < > = values in this interval not displayed.    Assessment: 65yo male subtherapeutic on heparin after resuming post-cath; no infusion issues or signs of bleeding per RN.  Goal of Therapy:  Heparin level 0.3-0.7 units/ml   Plan:  Will increase heparin infusion by 10% to 2000 units/hr and check level in 8 hours.    Wynona Neat, PharmD, BCPS  06/10/2021,5:07 AM

## 2021-06-10 NOTE — Progress Notes (Signed)
Complaining of some dyspnea.  Kidney function stable. Will give 1 dose of IV Lasix, 40 mg as recommended by nephrology. Will resume hydration this evening in preparation for PCI tomorrow.

## 2021-06-10 NOTE — Progress Notes (Signed)
[facility] Progress Note   65 y.o. male HTN DM HLD CKD3 followed by Dr. Carson Myrtle presenting to an outside ED with left sided substernal chest pain radiating down the left arm with negative myocardial perfusion stress test 04/2021. Increasing trend in troponins + heparin before being transferred to [facility]. His creatinine was 1.25 05/08/2020 with an episode of AKI a month later in 05/2020 with incr to the 2.5 range. Cr was 1.86 on 7/24 but has been steadily increasing.   Assessment/ Plan:   Renal failure with acute on CKD3 followed by [place] with h/o AKI 05/2020 which appears to have been secondary to an Abx being given for a toe infection and he was tole 3 weeks ago that his renal function had improved. His BL Cr appears to be in the 1.7-2 range and I attempted to call  [phone] to confirm. He appears to be euvolemic and I would hold off on diuresis as he appears to be headed for a cath tomorrow. Certainly at risk for AKI/ RRT with contrast and risk would increase with prerenal azotemia. - Fortunately renal function improved with gentle hydration but BP higher -> BP better now but he is also 4L positive during this hospitalization by I&O's and 2.1 kg up on scale. - Will discuss with cards and see if ok to give a single dose of Lasix. He feels mildly dyspneic as well.   - Will evaluate again tomorrow to ensure renal function remains stable; 3 vessel disease and appears that plan is for PCI of the LCx/1st OM tomorrow if renal function is stable. - Strict I&O's + daily weights. NSTEMI - on ASA/ statin/ nitro/ heparin/ BBlr and plan for cath tomorrow. HTN - uptitrating medical therapy. DM  Subjective:   No further CP. Mild  dyspnea.    Objective:   BP (!) 181/80   Pulse 77   Temp 98.3 F (36.8 C) (Oral)   Resp 15   Ht 6' (1.829 m)   Wt 97.4 kg Comment: bed  SpO2 98%   BMI 29.12 kg/m   Intake/Output Summary (Last 24 hours) at 06/10/2021 1114 Last data filed at  06/10/2021 0800 Gross per 24 hour  Intake 2057.49 ml  Output 800 ml  Net 1257.49 ml   Weight change: 1.5 kg  Physical Exam: General appearance: alert, cooperative Head: NCAT Back: symmetric, no curvature. ROM normal. No CVA tenderness. Resp: clear to auscultation bilaterally Cardio: regular rate and rhythm GI: SNDNT + BS Extremities: edema none  Imaging: CARDIAC CATHETERIZATION  Result Date: 06/09/2021 Formatting of this result is different from the original.   Ost LAD to Prox LAD lesion is 20% stenosed.   Mid LAD lesion is 50% stenosed.   Dist LAD lesion is 85% stenosed.   2nd Diag lesion is 90% stenosed.   1st Mrg lesion is 95% stenosed.   Ost Cx to Prox Cx lesion is 65% stenosed.   Dist Cx lesion is 90% stenosed.   Prox RCA lesion is 30% stenosed.   2nd RPL lesion is 90% stenosed.   LV end diastolic pressure is normal. 3 vessel obstructive CAD. The culprit lesion appears to be the ostial first OM which is a bifurcation lesion. He also has significant small vessel/distal disease in the second diagonal, distal LAD, distal LCx and terminal PL branches. Normal LVEDP Plan: reviewed films with Dr Tamala Julian. I would recommend PCI of the LCx/first OM bifurcation. His other disease is not amenable to PCI or CABG and will be  treated medically. Given his renal dysfunction will need to stage PCI. Anticipate bringing him back for PCI in 2 days if renal function is stable. Likely will require Cullotte technique to treat bifurcation. Will load with Brilinta.    Labs: BMET Recent Labs  Lab 06/07/21 1225 06/08/21 0104 06/08/21 0725 06/09/21 0504 06/09/21 0943 06/10/21 0902  NA 136 136 135 137 136 133*  K 3.3* 3.3* 3.3* 3.6 3.7 3.4*  CL 105 105 104 107 108 104  CO2 22 24 25 25 24 24   GLUCOSE 238* 219* 218* 145* 133* 241*  BUN 25* 22 25* 19 17 14   CREATININE 1.86* 2.19* 2.23* 1.95* 1.84* 1.87*  CALCIUM 8.9 8.6* 8.6* 8.2* 8.2* 8.0*   CBC Recent Labs  Lab 06/07/21 1225 06/08/21 0104  06/09/21 0220 06/10/21 0106  WBC 11.4* 12.4* 12.1* 12.3*  NEUTROABS 8.3*  --   --   --   HGB 13.5 11.1* 10.5* 10.2*  HCT 37.9* 31.8* 29.5* 29.5*  MCV 83.8 83.5 85.3 85.0  PLT 212 215 191 194    Medications:     aspirin EC  81 mg Oral Daily   atorvastatin  80 mg Oral Daily   carvedilol  12.5 mg Oral BID WC   Chlorhexidine Gluconate Cloth  6 each Topical Daily   cloNIDine  0.2 mg Oral BID   gabapentin  100 mg Oral BID   insulin aspart  0-9 Units Subcutaneous TID AC & HS   sodium chloride flush  3 mL Intravenous Q12H   sodium chloride flush  3 mL Intravenous Q12H   tamsulosin  0.4 mg Oral QPC supper   ticagrelor  90 mg Oral BID      Otelia Santee, MD 06/10/2021, 11:14 AM

## 2021-06-10 NOTE — Progress Notes (Addendum)
sBP 170s, on NG gtt 160 mcg/min. Clonidine (0.3 mg PO bid) d/c on admission, c/f rebound HTN so resumed at lower dose 0.2 mg PO bid for AM.

## 2021-06-10 NOTE — TOC Benefit Eligibility Note (Signed)
Transition of Care [facility]) Benefit Eligibility Note    Patient Details  Name: [name] MRN: [id number] Date of Birth: [birth year]   Medication/Dose: [name]  90 MG BID  Covered?: Yes  Tier: 3 Drug  Prescription Coverage Preferred Pharmacy: [name] with Person/Company/Phone Number:: MIA   @ [employer] [place] #  [phone]  Co-Pay: $45.00  Prior Approval: No  Deductible:  (NO DEDUCTIBLE WITH PLAN  / OUT-OF-POCKET:UNMET)  Additional Notes: TICAGRELOR: Crecencio Mc Phone Number: 06/10/2021, 1:27 PM

## 2021-06-10 NOTE — Progress Notes (Signed)
[place] for Heparin Indication: chest pain/ACS  HEPARIN DW (KG): 95.3   Labs: Recent Labs    06/07/21 1535 06/07/21 2035 06/08/21 0104 06/08/21 0725 06/08/21 1054 06/08/21 1449 06/09/21 0220 06/09/21 0504 06/09/21 0943 06/10/21 0106 06/10/21 0902 06/10/21 1314  HGB  --    < > 11.1*  --   --   --  10.5*  --   --  10.2*  --   --   HCT  --   --  31.8*  --   --   --  29.5*  --   --  29.5*  --   --   PLT  --   --  215  --   --   --  191  --   --  194  --   --   LABPROT  --   --  14.1  --   --   --   --   --   --   --   --   --   INR  --   --  1.1  --   --   --   --   --   --   --   --   --   HEPARINUNFRC  --    < > 0.13*  --   --    < > 0.36  --   --  0.25*  --  0.39  CREATININE  --    < > 2.19* 2.23*  --   --   --  1.95* 1.84*  --  1.87*  --   TROPONINIHS 845*  --   --  10,056* 8,206*  --   --   --   --   --   --   --    < > = values in this interval not displayed.    Medications:  See med rec  Assessment: Patient presented to ED with left sided chest pain and radiating down his arm. Troponins elevated. Pharmacy asked to dose heparin.   Heparin level was therapeutic this afternoon at 0.39 on  2000 units/hr.  No overt bleeding or complications noted.    Planning staged PCI on 7/28.  Goal of Therapy:  Heparin level 0.3-0.7 units/ml Monitor platelets by anticoagulation protocol: Yes   Plan:  Continue IV heparin at current rate. Daily heparin level and CBC.  [place] Crane, Roylene Reason, BCCP Clinical Pharmacist  06/10/2021 2:12 PM   [facility] pharmacy phone numbers are listed on [email or web address]

## 2021-06-11 ENCOUNTER — Encounter (HOSPITAL_COMMUNITY)
Admission: EM | Disposition: A | Discharge status: Home / Self Care | Payer: Self-pay | Attending: Interventional Cardiology

## 2021-06-11 DIAGNOSIS — R079 Chest pain, unspecified: Secondary | ICD-10-CM

## 2021-06-11 DIAGNOSIS — N1832 Chronic kidney disease, stage 3b: Secondary | ICD-10-CM

## 2021-06-11 DIAGNOSIS — R778 Other specified abnormalities of plasma proteins: Secondary | ICD-10-CM

## 2021-06-11 DIAGNOSIS — E1151 Type 2 diabetes mellitus with diabetic peripheral angiopathy without gangrene: Secondary | ICD-10-CM

## 2021-06-11 HISTORY — PX: CORONARY STENT INTERVENTION: CATH118234

## 2021-06-11 HISTORY — PX: CORONARY ULTRASOUND/IVUS: CATH118244

## 2021-06-11 HISTORY — PX: INTRAVASCULAR ULTRASOUND/IVUS: CATH118244

## 2021-06-11 LAB — BASIC METABOLIC PANEL
Anion gap: 5 (ref 5–15)
BUN: 17 mg/dL (ref 8–23)
CO2: 22 mmol/L (ref 22–32)
Calcium: 7.7 mg/dL — ABNORMAL LOW (ref 8.9–10.3)
Chloride: 107 mmol/L (ref 98–111)
Creatinine, Ser: 1.94 mg/dL — ABNORMAL HIGH (ref 0.61–1.24)
GFR, Estimated: 37 mL/min — ABNORMAL LOW (ref >60)
Glucose, Bld: 166 mg/dL — ABNORMAL HIGH (ref 70–99)
Potassium: 3.5 mmol/L (ref 3.5–5.1)
Sodium: 134 mmol/L — ABNORMAL LOW (ref 135–145)

## 2021-06-11 LAB — CBC
HCT: 26.7 % — ABNORMAL LOW (ref 39.0–52.0)
Hemoglobin: 9.3 g/dL — ABNORMAL LOW (ref 13.0–17.0)
MCH: 30 pg (ref 26.0–34.0)
MCHC: 34.8 g/dL (ref 30.0–36.0)
MCV: 86.1 fL (ref 80.0–100.0)
Platelets: 158 10*3/uL (ref 150–400)
RBC: 3.1 MIL/uL — ABNORMAL LOW (ref 4.22–5.81)
RDW: 13.4 % (ref 11.5–15.5)
WBC: 8.9 10*3/uL (ref 4.0–10.5)
nRBC: 0 % (ref 0.0–0.2)

## 2021-06-11 LAB — POCT ACTIVATED CLOTTING TIME
Activated Clotting Time: 300 seconds
Activated Clotting Time: 532 seconds
Activated Clotting Time: 254 seconds

## 2021-06-11 LAB — GLUCOSE, CAPILLARY
Glucose-Capillary: 164 mg/dL — ABNORMAL HIGH (ref 70–99)
Glucose-Capillary: 168 mg/dL — ABNORMAL HIGH (ref 70–99)
Glucose-Capillary: 144 mg/dL — ABNORMAL HIGH (ref 70–99)
Glucose-Capillary: 147 mg/dL — ABNORMAL HIGH (ref 70–99)

## 2021-06-11 LAB — HEPARIN LEVEL (UNFRACTIONATED): Heparin Unfractionated: 0.34 IU/mL (ref 0.30–0.70)

## 2021-06-11 SURGERY — CORONARY STENT INTERVENTION
Anesthesia: LOCAL

## 2021-06-11 MED ORDER — SODIUM CHLORIDE 0.9 % WEIGHT BASED INFUSION
1.0000 mL/kg/h | INTRAVENOUS | Status: AC
  Administered 2021-06-11: 1 mL/kg/h via INTRAVENOUS

## 2021-06-11 MED ORDER — IOHEXOL 350 MG/ML SOLN
INTRAVENOUS | Status: DC | PRN
  Administered 2021-06-11: 85 mL

## 2021-06-11 MED ORDER — MIDAZOLAM HCL 2 MG/2ML IJ SOLN
INTRAMUSCULAR | Status: AC
  Filled 2021-06-11: qty 2

## 2021-06-11 MED ORDER — LIDOCAINE HCL (PF) 1 % IJ SOLN
INTRAMUSCULAR | Status: DC | PRN
  Administered 2021-06-11: 2 mL

## 2021-06-11 MED ORDER — NITROGLYCERIN 1 MG/10 ML FOR IR/CATH LAB
INTRA_ARTERIAL | Status: DC | PRN
  Administered 2021-06-11 (×2): 200 ug via INTRACORONARY

## 2021-06-11 MED ORDER — ENOXAPARIN SODIUM 40 MG/0.4ML IJ SOSY
40.0000 mg | PREFILLED_SYRINGE | INTRAMUSCULAR | Status: DC
  Administered 2021-06-12: 40 mg via SUBCUTANEOUS
  Filled 2021-06-11: qty 0.4

## 2021-06-11 MED ORDER — INSULIN ASPART 100 UNIT/ML IJ SOLN
0.0000 [IU] | Freq: Three times a day (TID) | INTRAMUSCULAR | Status: DC
  Administered 2021-06-11: 3 [IU] via SUBCUTANEOUS
  Administered 2021-06-11: 2 [IU] via SUBCUTANEOUS
  Administered 2021-06-12: 5 [IU] via SUBCUTANEOUS

## 2021-06-11 MED ORDER — LABETALOL HCL 5 MG/ML IV SOLN
10.0000 mg | INTRAVENOUS | Status: AC | PRN
  Administered 2021-06-11 (×3): 10 mg via INTRAVENOUS
  Filled 2021-06-11 (×2): qty 4

## 2021-06-11 MED ORDER — POTASSIUM CHLORIDE CRYS ER 20 MEQ PO TBCR
40.0000 meq | EXTENDED_RELEASE_TABLET | Freq: Once | ORAL | Status: AC
  Administered 2021-06-11: 40 meq via ORAL
  Filled 2021-06-11: qty 2

## 2021-06-11 MED ORDER — HEPARIN (PORCINE) IN NACL 1000-0.9 UT/500ML-% IV SOLN
INTRAVENOUS | Status: DC | PRN
  Administered 2021-06-11 (×2): 500 mL

## 2021-06-11 MED ORDER — MIDAZOLAM HCL 2 MG/2ML IJ SOLN
INTRAMUSCULAR | Status: DC | PRN
  Administered 2021-06-11: 2 mg via INTRAVENOUS

## 2021-06-11 MED ORDER — CLONIDINE HCL 0.2 MG PO TABS
0.3000 mg | ORAL_TABLET | Freq: Two times a day (BID) | ORAL | Status: DC
  Administered 2021-06-11: 0.3 mg via ORAL
  Filled 2021-06-11: qty 1

## 2021-06-11 MED ORDER — HEPARIN SODIUM (PORCINE) 1000 UNIT/ML IJ SOLN
INTRAMUSCULAR | Status: AC
  Filled 2021-06-11: qty 1

## 2021-06-11 MED ORDER — HYDRALAZINE HCL 20 MG/ML IJ SOLN
10.0000 mg | INTRAMUSCULAR | Status: AC | PRN
Start: 2021-06-11 — End: 2021-06-11

## 2021-06-11 MED ORDER — HYDRALAZINE HCL 20 MG/ML IJ SOLN
INTRAMUSCULAR | Status: DC | PRN
Start: 2021-06-11 — End: 2021-06-11
  Administered 2021-06-11: 10 mg via INTRAVENOUS

## 2021-06-11 MED ORDER — CARVEDILOL 12.5 MG PO TABS
12.5000 mg | ORAL_TABLET | Freq: Two times a day (BID) | ORAL | Status: DC
  Administered 2021-06-11 – 2021-06-12 (×2): 12.5 mg via ORAL
  Filled 2021-06-11 (×2): qty 1

## 2021-06-11 MED ORDER — HYDRALAZINE HCL 20 MG/ML IJ SOLN
INTRAMUSCULAR | Status: AC
  Filled 2021-06-11: qty 1

## 2021-06-11 MED ORDER — VERAPAMIL HCL 2.5 MG/ML IV SOLN
INTRAVENOUS | Status: AC
  Filled 2021-06-11: qty 2

## 2021-06-11 MED ORDER — AMLODIPINE BESYLATE 5 MG PO TABS
5.0000 mg | ORAL_TABLET | Freq: Every day | ORAL | Status: DC
  Administered 2021-06-12: 5 mg via ORAL
  Filled 2021-06-11: qty 1

## 2021-06-11 MED ORDER — SODIUM CHLORIDE 0.9 % WEIGHT BASED INFUSION: 1.0000 mL/kg/h | INTRAVENOUS | Status: DC

## 2021-06-11 MED ORDER — FENTANYL CITRATE (PF) 100 MCG/2ML IJ SOLN
INTRAMUSCULAR | Status: DC | PRN
  Administered 2021-06-11: 25 ug via INTRAVENOUS

## 2021-06-11 MED ORDER — LIDOCAINE HCL (PF) 1 % IJ SOLN
INTRAMUSCULAR | Status: AC
  Filled 2021-06-11: qty 30

## 2021-06-11 MED ORDER — SODIUM CHLORIDE 0.9% FLUSH: 3.0000 mL | INTRAVENOUS | Status: DC | PRN

## 2021-06-11 MED ORDER — DAPAGLIFLOZIN PROPANEDIOL 10 MG PO TABS
10.0000 mg | ORAL_TABLET | Freq: Every day | ORAL | Status: DC
  Administered 2021-06-11 – 2021-06-12 (×2): 10 mg via ORAL
  Filled 2021-06-11 (×3): qty 1

## 2021-06-11 MED ORDER — INSULIN ASPART 100 UNIT/ML IJ SOLN: 0.0000 [IU] | Freq: Every day | INTRAMUSCULAR | Status: DC

## 2021-06-11 MED ORDER — CARVEDILOL 12.5 MG PO TABS
12.5000 mg | ORAL_TABLET | Freq: Two times a day (BID) | ORAL | Status: DC
  Administered 2021-06-11: 12.5 mg via ORAL

## 2021-06-11 MED ORDER — CLONIDINE HCL 0.2 MG PO TABS
0.3000 mg | ORAL_TABLET | Freq: Two times a day (BID) | ORAL | Status: DC
  Administered 2021-06-11 – 2021-06-12 (×2): 0.3 mg via ORAL
  Filled 2021-06-11 (×2): qty 1

## 2021-06-11 MED ORDER — SODIUM CHLORIDE 0.9 % IV SOLN: 250.0000 mL | INTRAVENOUS | Status: DC | PRN

## 2021-06-11 MED ORDER — NITROGLYCERIN 1 MG/10 ML FOR IR/CATH LAB
INTRA_ARTERIAL | Status: AC
  Filled 2021-06-11: qty 10

## 2021-06-11 MED ORDER — SODIUM CHLORIDE 0.9% FLUSH
3.0000 mL | Freq: Two times a day (BID) | INTRAVENOUS | Status: DC
  Administered 2021-06-11 – 2021-06-12 (×2): 3 mL via INTRAVENOUS

## 2021-06-11 MED ORDER — VERAPAMIL HCL 2.5 MG/ML IV SOLN
INTRAVENOUS | Status: DC | PRN
  Administered 2021-06-11: 10 mL via INTRA_ARTERIAL

## 2021-06-11 MED ORDER — FENTANYL CITRATE (PF) 100 MCG/2ML IJ SOLN
INTRAMUSCULAR | Status: AC
  Filled 2021-06-11: qty 2

## 2021-06-11 MED ORDER — HEPARIN SODIUM (PORCINE) 1000 UNIT/ML IJ SOLN
INTRAMUSCULAR | Status: DC | PRN
  Administered 2021-06-11: 2000 [IU] via INTRAVENOUS
  Administered 2021-06-11: 10000 [IU] via INTRAVENOUS

## 2021-06-11 SURGICAL SUPPLY — 23 items
BALLN EUPHORA RX 2.25X12 (BALLOONS) ×2
BALLN SAPPHIRE 1.5X12 (BALLOONS) ×2
BALLN SAPPHIRE ~~LOC~~ 2.75X15 (BALLOONS) ×2 IMPLANT
BALLN SAPPHIRE ~~LOC~~ 3.0X15 (BALLOONS) ×2 IMPLANT
BALLOON EUPHORA RX 2.25X12 (BALLOONS) ×1 IMPLANT
BALLOON SAPPHIRE 1.5X12 (BALLOONS) ×1 IMPLANT
CATH LAUNCHER 6FR EBU3.5 (CATHETERS) ×2 IMPLANT
CATH OPTICROSS HD (CATHETERS) ×2 IMPLANT
DEVICE RAD COMP TR BAND LRG (VASCULAR PRODUCTS) ×2 IMPLANT
ELECT DEFIB PAD ADLT CADENCE (PAD) ×2 IMPLANT
GLIDESHEATH SLEND SS 6F .021 (SHEATH) ×2 IMPLANT
GUIDEWIRE INQWIRE 1.5J.035X260 (WIRE) ×1 IMPLANT
INQWIRE 1.5J .035X260CM (WIRE) ×2
KIT ENCORE 26 ADVANTAGE (KITS) ×4 IMPLANT
KIT HEART LEFT (KITS) ×2 IMPLANT
PACK CARDIAC CATHETERIZATION (CUSTOM PROCEDURE TRAY) ×2 IMPLANT
SLED PULL BACK IVUS (MISCELLANEOUS) ×2 IMPLANT
STENT ONYX FRONTIER 2.75X26 (Permanent Stent) ×2 IMPLANT
STENT ONYX FRONTIER 3.0X22 (Permanent Stent) ×2 IMPLANT
STOPCOCK MORSE 400PSI 3WAY (MISCELLANEOUS) ×2 IMPLANT
TRANSDUCER W/STOPCOCK (MISCELLANEOUS) ×2 IMPLANT
TUBING CIL FLEX 10 FLL-RA (TUBING) ×2 IMPLANT
WIRE ASAHI PROWATER 180CM (WIRE) ×6 IMPLANT

## 2021-06-11 NOTE — Interval H&P Note (Signed)
History and Physical Interval Note:  06/11/2021 7:17 AM  [name]  has presented today for surgery, with the diagnosis of cad.  The various methods of treatment have been discussed with the patient and family. After consideration of risks, benefits and other options for treatment, the patient has consented to  Procedure(s): CORONARY STENT INTERVENTION (N/A) as a surgical intervention.  The patient's history has been reviewed, patient examined, no change in status, stable for surgery.  I have reviewed the patient's chart and labs.  Questions were answered to the patient's satisfaction.    Cath Lab Visit (complete for each Cath Lab visit)  Clinical Evaluation Leading to the Procedure:   ACS: Yes.    Non-ACS:    Anginal Classification: CCS IV  Anti-ischemic medical therapy: Minimal Therapy (1 class of medications)  Non-Invasive Test Results: No non-invasive testing performed  Prior CABG: No previous CABG       [name] 06/11/2021 7:17 AM

## 2021-06-11 NOTE — Progress Notes (Signed)
[facility] Progress Note   65 y.o. male HTN DM HLD CKD3 followed by Dr. Carson Myrtle presenting to an outside ED with left sided substernal chest pain radiating down the left arm with negative myocardial perfusion stress test 04/2021. Increasing trend in troponins + heparin before being transferred to [facility]. His creatinine was 1.25 05/08/2020 with an episode of AKI a month later in 05/2020 with incr to the 2.5 range. Cr was 1.86 on 7/24 but has been steadily increasing.   Assessment/ Plan:   Renal failure with acute on CKD3 followed by [facility] with h/o AKI 05/2020 which appears to have been secondary to an Abx being given for a toe infection and he was tole 3 weeks ago that his renal function had improved. His BL Cr appears to be in the 1.7-2 range and I attempted to call  [phone] to confirm. He appears to be euvolemic and I would hold off on diuresis as he appears to be headed for a cath tomorrow. Certainly at risk for AKI/ RRT with contrast and risk would increase with prerenal azotemia. - Fortunately renal function fairly stable today -> PCI today.  - Will evaluate again tomorrow to ensure renal function remains stable prior to dc; no further CP. - Strict I&O's + daily weights. NSTEMI - on ASA/ statin/ nitro/ heparin/ BBlr s/p PCI on 7/28 HTN - uptitrating medical therapy. DM  Subjective:   No further CP. Mild  dyspnea.    Objective:   BP (!) 177/80   Pulse (!) 0   Temp 98.8 F (37.1 C) (Oral)   Resp (!) 0   Ht 6' (1.829 m)   Wt 96.4 kg   SpO2 95%   BMI 28.82 kg/m   Intake/Output Summary (Last 24 hours) at 06/11/2021 1108 Last data filed at 06/11/2021 0700 Gross per 24 hour  Intake 2697.34 ml  Output 2225 ml  Net 472.34 ml   Weight change: -1 kg  Physical Exam: General appearance: alert, cooperative Head: NCAT Back: symmetric, no curvature. ROM normal. No CVA tenderness. Resp: clear to auscultation bilaterally Cardio: regular rate and rhythm GI:  SNDNT + BS Extremities: edema none  Imaging: CARDIAC CATHETERIZATION  Result Date: 06/11/2021 Formatting of this result is different from the original.   1st Mrg lesion is 95% stenosed.   A drug-eluting stent was successfully placed using a STENT ONYX FRONTIER 2.75X26.   Post intervention, there is a 0% residual stenosis.   Ost Cx to Prox Cx lesion is 65% stenosed.   A drug-eluting stent was successfully placed using a STENT ONYX FRONTIER 3.0X22.   Post intervention, there is a 0% residual stenosis. Successful Bifurcation stenting of the proximal to mid LCx and first OM using IVUS guidance and Cullotte technique with DES x 2 Plan: DAPT for one year. Aggressive risk factor modification. Will treat his other disease medically.    Labs: BMET Recent Labs  Lab 06/07/21 1225 06/08/21 0104 06/08/21 0725 06/09/21 0504 06/09/21 0943 06/10/21 0902 06/11/21 0150  NA 136 136 135 137 136 133* 134*  K 3.3* 3.3* 3.3* 3.6 3.7 3.4* 3.5  CL 105 105 104 107 108 104 107  CO2 22 24 25 25 24 24 22   GLUCOSE 238* 219* 218* 145* 133* 241* 166*  BUN 25* 22 25* 19 17 14 17   CREATININE 1.86* 2.19* 2.23* 1.95* 1.84* 1.87* 1.94*  CALCIUM 8.9 8.6* 8.6* 8.2* 8.2* 8.0* 7.7*   CBC Recent Labs  Lab 06/07/21 1225 06/08/21 0104 06/09/21 0220 06/10/21  0106 06/11/21 0150  WBC 11.4* 12.4* 12.1* 12.3* 8.9  NEUTROABS 8.3*  --   --   --   --   HGB 13.5 11.1* 10.5* 10.2* 9.3*  HCT 37.9* 31.8* 29.5* 29.5* 26.7*  MCV 83.8 83.5 85.3 85.0 86.1  PLT 212 215 191 194 158    Medications:     aspirin EC  81 mg Oral Daily   atorvastatin  80 mg Oral Daily   carvedilol  12.5 mg Oral BID WC   Chlorhexidine Gluconate Cloth  6 each Topical Daily   cloNIDine  0.3 mg Oral BID   dapagliflozin propanediol  10 mg Oral Daily   [START ON 06/12/2021] enoxaparin (LOVENOX) injection  40 mg Subcutaneous Q24H   gabapentin  100 mg Oral BID   insulin aspart  0-15 Units Subcutaneous TID WC   insulin aspart  0-5 Units Subcutaneous QHS    sodium chloride flush  3 mL Intravenous Q12H   sodium chloride flush  3 mL Intravenous Q12H   sodium chloride flush  3 mL Intravenous Q12H   tamsulosin  0.4 mg Oral QPC supper   ticagrelor  90 mg Oral BID      Otelia Santee, MD 06/11/2021, 11:08 AM

## 2021-06-11 NOTE — Progress Notes (Addendum)
Progress Note  Patient Name: [name] Date of Encounter: 06/11/2021  [facility] Cardiologist: Minus Breeding, MD   Subjective   No events last night.  Put out 2225 ml yesterday with Creat 1.97. In cath lab now.  PCI was successful.  85 cc contrast used.  Inpatient Medications    Scheduled Meds:  [MAR Hold] aspirin EC  81 mg Oral Daily   [MAR Hold] atorvastatin  80 mg Oral Daily   [MAR Hold] carvedilol  12.5 mg Oral BID WC   [MAR Hold] Chlorhexidine Gluconate Cloth  6 each Topical Daily   [MAR Hold] cloNIDine  0.2 mg Oral BID   [MAR Hold] gabapentin  100 mg Oral BID   [MAR Hold] insulin aspart  0-9 Units Subcutaneous TID AC & HS   [MAR Hold] sodium chloride flush  3 mL Intravenous Q12H   [MAR Hold] sodium chloride flush  3 mL Intravenous Q12H   sodium chloride flush  3 mL Intravenous Q12H   [MAR Hold] tamsulosin  0.4 mg Oral QPC supper   [MAR Hold] ticagrelor  90 mg Oral BID   Continuous Infusions:  [MAR Hold] sodium chloride     sodium chloride     sodium chloride 1 mL/kg/hr (06/11/21 0146)   heparin 2,000 Units/hr (06/11/21 0700)   nitroGLYCERIN 130 mcg/min (06/11/21 0700)   PRN Meds: [MAR Hold] sodium chloride, sodium chloride, [MAR Hold] acetaminophen, fentaNYL, Heparin (Porcine) in NaCl, heparin sodium (porcine), lidocaine (PF), midazolam, nitroGLYCERIN, [MAR Hold] ondansetron (ZOFRAN) IV, [MAR Hold] ondansetron (ZOFRAN) IV, Radial Cocktail/Verapamil only, [MAR Hold] sodium chloride flush, sodium chloride flush   Vital Signs    Vitals:   06/11/21 0630 06/11/21 0645 06/11/21 0700 06/11/21 0731  BP: 140/71 (!) 145/68 (!) 147/68   Pulse: 63 65 63   Resp: (!) 8 10 (!) 7   Temp:      TempSrc:      SpO2: 97% 97% 98% 100%  Weight:      Height:        Intake/Output Summary (Last 24 hours) at 06/11/2021 0833 Last data filed at 06/11/2021 0700 Gross per 24 hour  Intake 2926.22 ml  Output 2225 ml  Net 701.22 ml   Last 3 Weights 06/11/2021  06/10/2021 06/09/2021  Weight (lbs) 212 lb 8.4 oz 214 lb 11.7 oz 211 lb 6.7 oz  Weight (kg) 96.4 kg 97.4 kg 95.9 kg      Telemetry    NSR - Personally Reviewed  ECG    No new data - Personally Reviewed  Physical Exam  Unchanged from yesterday GEN: No acute distress.   Neck: No JVD Cardiac: RRR, no murmurs, rubs, or gallops.  Radial cath site is unremarkable. Right radial cath site looks okay with TR band. Respiratory: Clear to auscultation bilaterally. GI: Soft, nontender, non-distended  MS: No edema; No deformity. Neuro:  Nonfocal  Psych: Normal affect   Labs    High Sensitivity Troponin:   Recent Labs  Lab 06/07/21 1225 06/07/21 1535 06/08/21 0725 06/08/21 1054  TROPONINIHS 173* 845* 10,056* 8,206*      Chemistry Recent Labs  Lab 06/07/21 1225 06/08/21 0104 06/09/21 0943 06/10/21 0902 06/11/21 0150  NA 136   < > 136 133* 134*  K 3.3*   < > 3.7 3.4* 3.5  CL 105   < > 108 104 107  CO2 22   < > 24 24 22   GLUCOSE 238*   < > 133* 241* 166*  BUN 25*   < >  17 14 17   CREATININE 1.86*   < > 1.84* 1.87* 1.94*  CALCIUM 8.9   < > 8.2* 8.0* 7.7*  PROT 6.8  --   --   --   --   ALBUMIN 3.7  --   --   --   --   AST 22  --   --   --   --   ALT 15  --   --   --   --   ALKPHOS 93  --   --   --   --   BILITOT 0.7  --   --   --   --   GFRNONAA 39*   < > 40* 39* 37*  ANIONGAP 9   < > 4* 5 5   < > = values in this interval not displayed.     Hematology Recent Labs  Lab 06/09/21 0220 06/10/21 0106 06/11/21 0150  WBC 12.1* 12.3* 8.9  RBC 3.46* 3.47* 3.10*  HGB 10.5* 10.2* 9.3*  HCT 29.5* 29.5* 26.7*  MCV 85.3 85.0 86.1  MCH 30.3 29.4 30.0  MCHC 35.6 34.6 34.8  RDW 13.7 13.4 13.4  PLT 191 194 158    BNP Recent Labs  Lab 06/08/21 0724  BNP 217.1*     DDimer No results for input(s): DDIMER in the last 168 hours.   Radiology    No results found.  Cardiac Studies   See echo above. EF 60% PCI 06/11/2021: Intervention     Patient Profile     65  y.o. male with primary hypertension, type 2 diabetes, [name]-b, normal LVEF, and non-ST elevation MI with symptoms developing over the 72 hours prior to admission.  Assessment & Plan    NSTEMI: undergoing PCI. Will keep in hospital at least another 24 hours to assess kidney function. CKD III-b: Creat 1.97 this AM due to contrast and diuresis yesterday. Check BMET AM. DM II: Start Farxiga 10 mg daily. Primary hypertension: Will up titrate clonidine to 0.3 mg BID Lipids: High intensity statin  Possibly home tomorrow if no problems and kidney function stable.  For questions or updates, please contact [place] Please consult [email or web address] for contact info under        Signed, Sinclair Grooms, MD  06/11/2021, 8:33 AM

## 2021-06-11 NOTE — Progress Notes (Signed)
CARDIAC REHAB PHASE I   Began MI/stent education with pt and family. Pt educated on importance of ASA, Brilinta, statin, and NTG. Pt given MI book and stent card along with heart healthy and diabetic diets. Reviewed site care, and restrictions. Will return tomorrow to complete education and walk with pt. Pt and family deny questions or concerns at this time. Will refer to CRP II [place].  [id number] Rufina Falco, RN BSN 06/11/2021 2:10 PM

## 2021-06-12 ENCOUNTER — Encounter (HOSPITAL_COMMUNITY): Payer: Self-pay | Admitting: Cardiology

## 2021-06-12 ENCOUNTER — Other Ambulatory Visit (HOSPITAL_COMMUNITY): Payer: Self-pay

## 2021-06-12 LAB — CBC
HCT: 29.9 % — ABNORMAL LOW (ref 39.0–52.0)
Hemoglobin: 10.1 g/dL — ABNORMAL LOW (ref 13.0–17.0)
MCH: 29.4 pg (ref 26.0–34.0)
MCHC: 33.8 g/dL (ref 30.0–36.0)
MCV: 86.9 fL (ref 80.0–100.0)
Platelets: 178 10*3/uL (ref 150–400)
RBC: 3.44 MIL/uL — ABNORMAL LOW (ref 4.22–5.81)
RDW: 13.7 % (ref 11.5–15.5)
WBC: 8.9 10*3/uL (ref 4.0–10.5)
nRBC: 0 % (ref 0.0–0.2)

## 2021-06-12 LAB — BASIC METABOLIC PANEL
Anion gap: 7 (ref 5–15)
BUN: 15 mg/dL (ref 8–23)
CO2: 23 mmol/L (ref 22–32)
Calcium: 8.4 mg/dL — ABNORMAL LOW (ref 8.9–10.3)
Chloride: 107 mmol/L (ref 98–111)
Creatinine, Ser: 1.91 mg/dL — ABNORMAL HIGH (ref 0.61–1.24)
GFR, Estimated: 38 mL/min — ABNORMAL LOW (ref >60)
Glucose, Bld: 139 mg/dL — ABNORMAL HIGH (ref 70–99)
Potassium: 4.1 mmol/L (ref 3.5–5.1)
Sodium: 137 mmol/L (ref 135–145)

## 2021-06-12 LAB — GLUCOSE, CAPILLARY
Glucose-Capillary: 114 mg/dL — ABNORMAL HIGH (ref 70–99)
Glucose-Capillary: 203 mg/dL — ABNORMAL HIGH (ref 70–99)

## 2021-06-12 MED ORDER — TICAGRELOR 90 MG PO TABS
90.0000 mg | ORAL_TABLET | Freq: Two times a day (BID) | ORAL | 3 refills | Status: DC
  Filled 2021-06-12: qty 60, 30d supply, fill #0

## 2021-06-12 MED ORDER — NITROGLYCERIN 0.4 MG SL SUBL
0.4000 mg | SUBLINGUAL_TABLET | SUBLINGUAL | 3 refills | Status: DC | PRN
  Filled 2021-06-12: qty 25, 14d supply, fill #0

## 2021-06-12 MED ORDER — DAPAGLIFLOZIN PROPANEDIOL 10 MG PO TABS
10.0000 mg | ORAL_TABLET | Freq: Every day | ORAL | 6 refills | Status: DC
  Filled 2021-06-12: qty 30, 30d supply, fill #0

## 2021-06-12 MED ORDER — ASPIRIN 81 MG PO TBEC
81.0000 mg | DELAYED_RELEASE_TABLET | Freq: Every day | ORAL | 3 refills | Status: AC
  Filled 2021-06-12: qty 90, 90d supply, fill #0

## 2021-06-12 MED ORDER — ATORVASTATIN CALCIUM 80 MG PO TABS
80.0000 mg | ORAL_TABLET | Freq: Every day | ORAL | 3 refills | Status: DC
  Filled 2021-06-12: qty 90, 90d supply, fill #0

## 2021-06-12 MED ORDER — CARVEDILOL 12.5 MG PO TABS
12.5000 mg | ORAL_TABLET | Freq: Two times a day (BID) | ORAL | 3 refills | Status: DC
  Filled 2021-06-12: qty 180, 90d supply, fill #0

## 2021-06-12 MED ORDER — EMPAGLIFLOZIN 10 MG PO TABS
10.0000 mg | ORAL_TABLET | Freq: Every day | ORAL | 6 refills | Status: DC
  Filled 2021-06-12: qty 14, 14d supply, fill #0

## 2021-06-12 MED ORDER — AMLODIPINE BESYLATE 5 MG PO TABS
5.0000 mg | ORAL_TABLET | Freq: Every day | ORAL | 3 refills | Status: DC
  Filled 2021-06-12: qty 90, 90d supply, fill #0

## 2021-06-12 NOTE — Progress Notes (Signed)
CARDIAC REHAB PHASE I   PRE:  Rate/Rhythm: 65 SR  BP:  Sitting: 146/94      SaO2: 100 RA  MODE:  Ambulation: 170 ft   POST:  Rate/Rhythm: 70 SR  BP:  Sitting: 163/70    SaO2: 96 RA  Pt ambulated 187ft in hallway assist of one with slow, steady gait. Pt denies CP, SOB, or dizziness. Pt returned to chair, oozing noted at cath site. RN made aware and at bedside. Reinforced d/c education including importance of ASA, Brilinta, site care, and restrictions. Will refer to CRP II .  [phone] Rufina Falco, RN BSN 06/12/2021 10:37 AM

## 2021-06-12 NOTE — Progress Notes (Signed)
Patient discharged in stable condition. Peripheral IV's removed and discharged instructions provided with good verbal response. All personal items with patient. Denies chest pain.

## 2021-06-12 NOTE — Discharge Summary (Addendum)
The patient has been seen in conjunction with Harlan Stains, NP-C. All aspects of care have been considered and discussed. The patient has been personally interviewed, examined, and all clinical data has been reviewed.  Patient has done well with ambulation.  He is being discharged as per prior note earlier today.  Discharge Summary    Patient ID: [name] MRN: [id number]; DOB: 1955-12-12  Admit date: 06/07/2021 Discharge date: 06/12/2021  PCP:  [name], DO   [facility] Providers Cardiologist:  Minus Breeding, MD        Discharge Diagnoses    Principal Problem:   NSTEMI (non-ST elevated myocardial infarction) [place]) Active Problems:   Hypertension associated with diabetes ([place])   Hyperlipidemia associated with type 2 diabetes mellitus ([place])   Chest pain   Unstable angina ([place])   Elevated troponin   DM (diabetes mellitus) type II, controlled, with peripheral vascular disorder (HCC)   Chronic kidney disease (CKD) stage G3b/A2, moderately decreased glomerular filtration rate (GFR) between 30-44 mL/min/1.73 square meter and albuminuria creatinine ratio between 30-299 mg/g [facility])    Diagnostic Studies/Procedures    Echocardiogram 06/08/21: 1. Left ventricular ejection fraction, by estimation, is 60 to 65%. The  left ventricle has normal function. The left ventricle has no regional  wall motion abnormalities. There is mild left ventricular hypertrophy.  Left ventricular diastolic parameters  are consistent with Grade I diastolic dysfunction (impaired relaxation).   2. Right ventricular systolic function is normal. The right ventricular  size is normal. There is normal pulmonary artery systolic pressure. The  estimated right ventricular systolic pressure is 98.9 mmHg.   3. The mitral valve is abnormal. Trivial mitral valve regurgitation. Mild  mitral stenosis. The mean mitral valve gradient is 6.0 mmHg. Moderate  mitral annular calcification.   4.  The aortic valve is tricuspid. Aortic valve regurgitation is not  visualized. Mild aortic valve sclerosis is present, with no evidence of  aortic valve stenosis.   5. The inferior vena cava is normal in size with greater than 50%  respiratory variability, suggesting right atrial pressure of 3 mmHg.   LHC 06/09/21:   Ost LAD to Prox LAD lesion is 20% stenosed.   Mid LAD lesion is 50% stenosed.   Dist LAD lesion is 85% stenosed.   2nd Diag lesion is 90% stenosed.   1st Mrg lesion is 95% stenosed.   Ost Cx to Prox Cx lesion is 65% stenosed.   Dist Cx lesion is 90% stenosed.   Prox RCA lesion is 30% stenosed.   2nd RPL lesion is 90% stenosed.   LV end diastolic pressure is normal.   3 vessel obstructive CAD. The culprit lesion appears to be the ostial first OM which is a bifurcation lesion. He also has significant small vessel/distal disease in the second diagonal, distal LAD, distal LCx and terminal PL branches. Normal LVEDP   Plan: reviewed films with Dr Tamala Julian. I would recommend PCI of the LCx/first OM bifurcation. His other disease is not amenable to PCI or CABG and will be treated medically. Given his renal dysfunction will need to stage PCI. Anticipate bringing him back for PCI in 2 days if renal function is stable. Likely will require Cullotte technique to treat bifurcation. Will load with Brilinta.   Diagnostic Dominance: Right      Staged intervention 06/11/21:     1st Mrg lesion is 95% stenosed.   A drug-eluting stent was successfully placed using a STENT ONYX FRONTIER 2.75X26.  Post intervention, there is a 0% residual stenosis.   Ost Cx to Prox Cx lesion is 65% stenosed.   A drug-eluting stent was successfully placed using a STENT ONYX FRONTIER 3.0X22.   Post intervention, there is a 0% residual stenosis.   Successful Bifurcation stenting of the proximal to mid LCx and first OM using IVUS guidance and Cullotte technique with DES x 2   Plan: DAPT for one year. Aggressive  risk factor modification. Will treat his other disease medically  Intervention      _____________   History of Present Illness     [name] is a 65 y.o. male with a PMH of HTN, HLD, DM type 2, and CKD stage 3a, who presented with chest pain. He presented to an outside emergency department with significant left-sided substernal chest pain that started on the morning of 06/07/21.  Noted that the pain was radiating down his left arm however was not associated with any shortness of breath.  He was having some diaphoresis, nausea, but no vomiting or lightheadedness.  Initially presented and received nitroglycerin along with an aspirin load which subsided his pain some.  Has had some typical angina pain over the last several months and has been followed by Dr. Lavada Mesi who had him undergo a myocardial perfusion stress test in June 2022 which was negative.  However was still having some episodes and now is progressive.   Upon arrival to outside ED, he was having active chest pain and was started on a nitroglycerin infusion.  He was having escalated doses of his nitroglycerin in order to keep his chest pain at bay.  Initial set of troponins from 173->845 and 3 hours.  He was started on heparin for anticoagulation transferred to [facility] for further management.  Hospital Course     Consultants: Nephrology   1. NSTEMI: patient presented with chest pain. EKG non-ischemic. HsTrop peaked at 10,056 and trended down. He was started on a heparin gtt and nitro gtt. Echocardiogram 06/08/21 showed EF 60-65%, mild LVH, G1DD, no RWMA, and mild MS. LHC 06/09/21 showed severe 3-vessel CAD with 20% ostial-pLAD stenosis, 50% mLAD stenosis, 85% dLAD stenosis, 90% D2 stenosis, 65% ostial-pLCx stenosis, 90% dLCx stenosis, 95% OM1 stenosis, 30% pRCA stenosis, and 90% 2nd RPL stenosis. Culprit lesion was fel to be his OM1 lesion, for which he was recommended for staged PCI/DES to Lcx/OM1 bifurcation . His other  disease was not fel tto be amenable to PCI/CABG and was treated medically. He was loaded with Brilinta at that time. He was taken back to the cath lab 06/11/21 with successful bifurcation PCI/DES x2 to mLCx and OM1. He was recommended to continue DAPT x1 year.  - Continue aspirin and brilinta uninterrupted x1 year - Continue statin - Continue Bblocker - Continue jardiance  2. HTN: BP persistently elevated this admission, improved on the day of discharge. Home losartan discontinued in the setting of AoCKD. He was started on carvedilol which was uptitrated to 12.5mg  BID. Clonidine increased to home 0.3 mg BID. Amlodipine initially held and restarted at lower dose, 5mg  daily - Continue amlodipine, clonidine, and carvedilol - Home lasix held on discharge with plans to restart at the discretion of outpatient nephrology. - Patient instructed to keep a BP log and notify the office if BP not improving over the next 2 weeks. Low threshold to increase amlodipine if BP elevated at follow-up.   3. HLD: LDL 102 this admission. He was started on atorvastatin 80mg  daily - Continue  atorvastatin - Will need FLP/LFTs in 6-8 weeks for monitoring. Low threshold to refer to lipid clinic if LDL not at goal given extensive CAD.  4. DM type 2: A1C 6.5 this admission, at goal of <7. Started on SGLT2-inhibitor this admission in light of #1. - Home insulin resumed at discharge - Continue jardiance.   5. CKD stage 3b: Cr peaked at 2.23 this admission prior to undergoing LHC. Nephrology consulted and followed throughout admission. His AoCKD was attributed to recent antibiotics use for a toe infection 3 weeks prior. Cr 1.91 on the day of discharge.  - Continue to monitor outpatient.     Did the patient have an acute coronary syndrome (MI, NSTEMI, STEMI, etc) this admission?:  Yes                               AHA/ACC Clinical Performance & Quality Measures: Aspirin prescribed? - Yes ADP Receptor Inhibitor  (Plavix/Clopidogrel, Brilinta/Ticagrelor or Effient/Prasugrel) prescribed (includes medically managed patients)? - Yes Beta Blocker prescribed? - Yes High Intensity Statin (Lipitor 40-80mg  or Crestor 20-40mg ) prescribed? - Yes EF assessed during THIS hospitalization? - Yes For EF <40%, was ACEI/ARB prescribed? - Not Applicable (EF >/= 97%) For EF <40%, Aldosterone Antagonist (Spironolactone or Eplerenone) prescribed? - Not Applicable (EF >/= 35%) Cardiac Rehab Phase II ordered (including medically managed patients)? - Yes      _____________  Discharge Vitals Blood pressure 97/60, pulse (!) 59, temperature 98.2 F (36.8 C), resp. rate 18, height 6' (1.829 m), weight 93.2 kg, SpO2 98 %.  Filed Weights   06/10/21 0500 06/11/21 0500 06/12/21 0500  Weight: 97.4 kg 96.4 kg 93.2 kg    Labs & Radiologic Studies    CBC Recent Labs    06/11/21 0150 06/12/21 0055  WBC 8.9 8.9  HGB 9.3* 10.1*  HCT 26.7* 29.9*  MCV 86.1 86.9  PLT 158 329   Basic Metabolic Panel Recent Labs    06/11/21 0150 06/12/21 0055  NA 134* 137  K 3.5 4.1  CL 107 107  CO2 22 23  GLUCOSE 166* 139*  BUN 17 15  CREATININE 1.94* 1.91*  CALCIUM 7.7* 8.4*   Liver Function Tests No results for input(s): AST, ALT, ALKPHOS, BILITOT, PROT, ALBUMIN in the last 72 hours. No results for input(s): LIPASE, AMYLASE in the last 72 hours. High Sensitivity Troponin:   Recent Labs  Lab 06/07/21 1225 06/07/21 1535 06/08/21 0725 06/08/21 1054  TROPONINIHS 173* 845* 10,056* 8,206*    BNP Invalid input(s): POCBNP D-Dimer No results for input(s): DDIMER in the last 72 hours. Hemoglobin A1C No results for input(s): HGBA1C in the last 72 hours. Fasting Lipid Panel No results for input(s): CHOL, HDL, LDLCALC, TRIG, CHOLHDL, LDLDIRECT in the last 72 hours. Thyroid Function Tests No results for input(s): TSH, T4TOTAL, T3FREE, THYROIDAB in the last 72 hours.  Invalid input(s): FREET3 _____________  DG Chest 2  View  Result Date: 06/07/2021 CLINICAL DATA:  Chest pain. EXAM: CHEST - 2 VIEW COMPARISON:  05/31/2018 FINDINGS: The heart size and mediastinal contours are within normal limits. There is no evidence of pulmonary edema, consolidation, pneumothorax, nodule or pleural fluid. The visualized skeletal structures are unremarkable. IMPRESSION: No active cardiopulmonary disease. Electronically Signed   By: Aletta Edouard M.D.   On: 06/07/2021 14:38   CARDIAC CATHETERIZATION  Result Date: 06/11/2021 Formatting of this result is different from the original.   1st Mrg lesion is 95% stenosed.  A drug-eluting stent was successfully placed using a STENT ONYX FRONTIER 2.75X26.   Post intervention, there is a 0% residual stenosis.   Ost Cx to Prox Cx lesion is 65% stenosed.   A drug-eluting stent was successfully placed using a STENT ONYX FRONTIER 3.0X22.   Post intervention, there is a 0% residual stenosis. Successful Bifurcation stenting of the proximal to mid LCx and first OM using IVUS guidance and Cullotte technique with DES x 2 Plan: DAPT for one year. Aggressive risk factor modification. Will treat his other disease medically.   CARDIAC CATHETERIZATION  Result Date: 06/09/2021 Formatting of this result is different from the original.   Ost LAD to Prox LAD lesion is 20% stenosed.   Mid LAD lesion is 50% stenosed.   Dist LAD lesion is 85% stenosed.   2nd Diag lesion is 90% stenosed.   1st Mrg lesion is 95% stenosed.   Ost Cx to Prox Cx lesion is 65% stenosed.   Dist Cx lesion is 90% stenosed.   Prox RCA lesion is 30% stenosed.   2nd RPL lesion is 90% stenosed.   LV end diastolic pressure is normal. 3 vessel obstructive CAD. The culprit lesion appears to be the ostial first OM which is a bifurcation lesion. He also has significant small vessel/distal disease in the second diagonal, distal LAD, distal LCx and terminal PL branches. Normal LVEDP Plan: reviewed films with Dr Tamala Julian. I would recommend PCI of the LCx/first  OM bifurcation. His other disease is not amenable to PCI or CABG and will be treated medically. Given his renal dysfunction will need to stage PCI. Anticipate bringing him back for PCI in 2 days if renal function is stable. Likely will require Cullotte technique to treat bifurcation. Will load with Brilinta.   ECHOCARDIOGRAM COMPLETE  Result Date: 06/08/2021    ECHOCARDIOGRAM REPORT   Patient Name:   [name] Date of Exam: 06/08/2021 Medical Rec #:  [id number]            Height:       72.0 in Accession #:    [id number]           Weight:       210.0 lb Date of Birth:  [birth year]            BSA:          2.175 m Patient Age:    45 years             BP:           152/80 mmHg Patient Gender: M                    HR:           70 bpm. Exam Location:  Inpatient Procedure: 2D Echo, Cardiac Doppler, Color Doppler and Intracardiac            Opacification Agent Indications:    Acute myocardial infarction, unspecified I21.9  History:        Patient has no prior history of Echocardiogram examinations.                 Risk Factors:Hypertension, Diabetes and Dyslipidemia.  Sonographer:    Darlina Sicilian RDCS Referring Phys: [id number] [place]  1. Left ventricular ejection fraction, by estimation, is 60 to 65%. The left ventricle has normal function. The left ventricle has no regional wall motion abnormalities. There is mild left ventricular hypertrophy. Left ventricular diastolic parameters are  consistent with Grade I diastolic dysfunction (impaired relaxation).  2. Right ventricular systolic function is normal. The right ventricular size is normal. There is normal pulmonary artery systolic pressure. The estimated right ventricular systolic pressure is 51.7 mmHg.  3. The mitral valve is abnormal. Trivial mitral valve regurgitation. Mild mitral stenosis. The mean mitral valve gradient is 6.0 mmHg. Moderate mitral annular calcification.  4. The aortic valve is tricuspid. Aortic valve regurgitation  is not visualized. Mild aortic valve sclerosis is present, with no evidence of aortic valve stenosis.  5. The inferior vena cava is normal in size with greater than 50% respiratory variability, suggesting right atrial pressure of 3 mmHg. FINDINGS  Left Ventricle: Left ventricular ejection fraction, by estimation, is 60 to 65%. The left ventricle has normal function. The left ventricle has no regional wall motion abnormalities. Definity contrast agent was given IV to delineate the left ventricular  endocardial borders. The left ventricular internal cavity size was normal in size. There is mild left ventricular hypertrophy. Left ventricular diastolic parameters are consistent with Grade I diastolic dysfunction (impaired relaxation). Right Ventricle: The right ventricular size is normal. No increase in right ventricular wall thickness. Right ventricular systolic function is normal. There is normal pulmonary artery systolic pressure. The tricuspid regurgitant velocity is 2.78 m/s, and  with an assumed right atrial pressure of 3 mmHg, the estimated right ventricular systolic pressure is 61.6 mmHg. Left Atrium: Left atrial size was normal in size. Right Atrium: Right atrial size was normal in size. Pericardium: Trivial pericardial effusion is present. Mitral Valve: The mitral valve is abnormal. There is mild calcification of the mitral valve leaflet(s). Moderate mitral annular calcification. Trivial mitral valve regurgitation. Mild mitral valve stenosis. MV peak gradient, 12.2 mmHg. The mean mitral valve gradient is 6.0 mmHg. Tricuspid Valve: The tricuspid valve is normal in structure. Tricuspid valve regurgitation is trivial. Aortic Valve: The aortic valve is tricuspid. Aortic valve regurgitation is not visualized. Mild aortic valve sclerosis is present, with no evidence of aortic valve stenosis. Pulmonic Valve: The pulmonic valve was normal in structure. Pulmonic valve regurgitation is trivial. Aorta: The aortic root is  normal in size and structure. Venous: The inferior vena cava is normal in size with greater than 50% respiratory variability, suggesting right atrial pressure of 3 mmHg. IAS/Shunts: No atrial level shunt detected by color flow Doppler.  LEFT VENTRICLE PLAX 2D LVIDd:         4.70 cm      Diastology LVIDs:         3.30 cm      LV e' medial:    4.64 cm/s LV PW:         1.10 cm      LV E/e' medial:  31.9 LV IVS:        1.30 cm      LV e' lateral:   9.52 cm/s LVOT diam:     2.20 cm      LV E/e' lateral: 15.5 LV SV:         87 LV SV Index:   40 LVOT Area:     3.80 cm  LV Volumes (MOD) LV vol d, MOD A2C: 136.0 ml LV vol d, MOD A4C: 118.0 ml LV vol s, MOD A2C: 59.0 ml LV vol s, MOD A4C: 51.9 ml LV SV MOD A2C:     77.0 ml LV SV MOD A4C:     118.0 ml LV SV MOD BP:      68.6 ml RIGHT  VENTRICLE RV S prime:     12.90 cm/s TAPSE (M-mode): 1.7 cm LEFT ATRIUM             Index       RIGHT ATRIUM           Index LA diam:        4.20 cm 1.93 cm/m  RA Area:     14.00 cm LA Vol (A2C):   61.7 ml 28.37 ml/m RA Volume:   31.20 ml  14.34 ml/m LA Vol (A4C):   57.8 ml 26.57 ml/m LA Biplane Vol: 59.1 ml 27.17 ml/m  AORTIC VALVE LVOT Vmax:   106.00 cm/s LVOT Vmean:  71.900 cm/s LVOT VTI:    0.229 m  AORTA Ao Root diam: 3.10 cm Ao Asc diam:  2.90 cm MITRAL VALVE                TRICUSPID VALVE MV Area (PHT): 3.74 cm     TR Peak grad:   30.9 mmHg MV Area VTI:   1.54 cm     TR Vmax:        278.00 cm/s MV Peak grad:  12.2 mmHg MV Mean grad:  6.0 mmHg     SHUNTS MV Vmax:       1.75 m/s     Systemic VTI:  0.23 m MV Vmean:      117.0 cm/s   Systemic Diam: 2.20 cm MV Decel Time: 203 msec MV E velocity: 148.00 cm/s MV A velocity: 149.00 cm/s MV E/A ratio:  0.99 Loralie Champagne MD Electronically signed by Loralie Champagne MD Signature Date/Time: 06/08/2021/3:46:25 PM    Final    Disposition   Pt is being discharged home today in good condition.  Follow-up Plans & Appointments     Discharge Instructions     Amb Referral to Cardiac  Rehabilitation   Complete by: As directed    Diagnosis:  Coronary Stents NSTEMI     After initial evaluation and assessments completed: Virtual Based Care may be provided alone or in conjunction with Phase 2 Cardiac Rehab based on patient barriers.: Yes       Discharge Medications   Allergies as of 06/12/2021   No Known Allergies      Medication List     STOP taking these medications    furosemide 20 MG tablet Commonly known as: LASIX   losartan 25 MG tablet Commonly known as: COZAAR   ondansetron 8 MG disintegrating tablet Commonly known as: ZOFRAN-ODT   sildenafil 25 MG tablet Commonly known as: Viagra       TAKE these medications    amLODipine 5 MG tablet Commonly known as: NORVASC Take 1 tablet (5 mg total) by mouth daily. Start taking on: June 13, 2021 What changed:  medication strength how much to take   aspirin 81 MG EC tablet Take 1 tablet (81 mg total) by mouth daily. Swallow whole. Start taking on: June 13, 2021   atorvastatin 80 MG tablet Commonly known as: LIPITOR Take 1 tablet (80 mg total) by mouth daily. Start taking on: June 13, 2021   carvedilol 12.5 MG tablet Commonly known as: COREG Take 1 tablet (12.5 mg total) by mouth 2 (two) times daily with a meal.   cloNIDine 0.3 MG tablet Commonly known as: CATAPRES Take 1 tablet (0.3 mg total) by mouth 2 (two) times daily.   dapagliflozin propanediol 10 MG Tabs tablet Commonly known as: FARXIGA Take 1 tablet (10 mg total) by mouth daily.  Start taking on: June 13, 2021   gabapentin 100 MG capsule Commonly known as: NEURONTIN Take 1 capsule (100 mg total) by mouth 2 (two) times daily.   Pen Needles 31G X 5 MM Misc Use daily with insulin Dx E11.9   tamsulosin 0.4 MG Caps capsule Commonly known as: FLOMAX Take 1 capsule (0.4 mg total) by mouth daily after supper.   ticagrelor 90 MG Tabs tablet Commonly known as: BRILINTA Take 1 tablet (90 mg total) by mouth 2 (two) times daily.    [name] FlexTouch 100 UNIT/ML FlexTouch Pen Generic drug: insulin degludec INJECT UP TO 36 UNITS EVERY MORNING. (MAY INCREASE BY 1 UNIT EVERY OTHER DAY TIL FBS BELOW 150)       ASK your doctor about these medications    cholecalciferol 25 MCG (1000 UNIT) tablet Commonly known as: VITAMIN D3 Take 2,000 Units by mouth daily.   ferrous sulfate 325 (65 FE) MG tablet Take 325 mg by mouth daily with breakfast.   loratadine 10 MG tablet Commonly known as: CLARITIN Take 1 tablet (10 mg total) by mouth every other day. (renally dosed)   vitamin B-12 500 MCG tablet Commonly known as: CYANOCOBALAMIN Take 500 mcg by mouth daily.           Outstanding Labs/Studies   Will need BMET at follow-up  Will need FLP/LFTs in 6-8 weeks  Duration of Discharge Encounter   Greater than 30 minutes including physician time.  Signed, Abigail Butts, PA-C 06/12/2021, 12:47 PM

## 2021-06-12 NOTE — Discharge Instructions (Addendum)
PLEASE REMEMBER TO BRING ALL OF YOUR MEDICATIONS TO EACH OF YOUR FOLLOW-UP OFFICE VISITS.  PLEASE ATTEND ALL SCHEDULED FOLLOW-UP APPOINTMENTS.   Activity: Increase activity slowly as tolerated. You may shower, but no soaking baths (or swimming) for 1 week. No driving for 24 hours. No lifting over 5 lbs for 1 week. No sexual activity for 1 week.   You May Return to Work: in 1 week (if applicable)  Wound Care: You may wash cath site gently with soap and water. Keep cath site clean and dry. If you notice pain, swelling, bleeding or pus at your cath site, please call [phone].   Please keep a log of your blood pressures to bring to your follow-up visit which will help determine if additional medication changes should occur.    It is important that you take your brilinta and aspirin as prescribed. Missing doses of these medications puts you at risk for developing a blockage in your stents which can cause another heart attack.  MEDICATION CHANGES: - START aspirin 81 mg daily - helps keep your stent open - START brilinta 90mg  two times per day - helps keep your stent open - START atorvastatin (lipitor) 80mg  daily- for cholesterol - START nitroglycerin SL as needed - use as needed for chest pain or shortness of breath that does not go away with rest. Take one tab under the tongue if needed, wait 5 minutes and if symptoms are ongoing you can take an additional tab under the tongue. You can take up to 3 tabs in a 15 minute period, though if taking a 3rd tab, please call EMS to get evaluated.  - START carvedilol (coreg) 12.5mg  two times per day - for heart disease and blood pressure - DECREASE amlodipine to 5mg  daily - for blood pressure - STOP losartan and lasix for now

## 2021-06-12 NOTE — Progress Notes (Signed)
Progress Note  Patient Name: [name] Date of Encounter: 06/12/2021  [facility] Cardiologist: Minus Breeding, MD   Subjective   No events last night.  Put out 2225 ml yesterday with Creat 1.97. In cath lab now.  PCI was successful.  85 cc contrast used.  Inpatient Medications    Scheduled Meds:  amLODipine  5 mg Oral Daily   aspirin EC  81 mg Oral Daily   atorvastatin  80 mg Oral Daily   carvedilol  12.5 mg Oral BID WC   Chlorhexidine Gluconate Cloth  6 each Topical Daily   cloNIDine  0.3 mg Oral BID   cloNIDine  0.3 mg Oral BID   dapagliflozin propanediol  10 mg Oral Daily   enoxaparin (LOVENOX) injection  40 mg Subcutaneous Q24H   gabapentin  100 mg Oral BID   insulin aspart  0-15 Units Subcutaneous TID WC   insulin aspart  0-5 Units Subcutaneous QHS   sodium chloride flush  3 mL Intravenous Q12H   sodium chloride flush  3 mL Intravenous Q12H   sodium chloride flush  3 mL Intravenous Q12H   tamsulosin  0.4 mg Oral QPC supper   ticagrelor  90 mg Oral BID   Continuous Infusions:  sodium chloride     sodium chloride     PRN Meds: sodium chloride, sodium chloride, acetaminophen, ondansetron (ZOFRAN) IV, ondansetron (ZOFRAN) IV, sodium chloride flush, sodium chloride flush   Vital Signs    Vitals:   06/12/21 0500 06/12/21 0530 06/12/21 0600 06/12/21 0630  BP: (!) 177/74  (!) 174/61   Pulse: (!) 59 60 61 73  Resp:      Temp:      TempSrc:      SpO2: 98% 99% 98% 97%  Weight: 93.2 kg     Height:        Intake/Output Summary (Last 24 hours) at 06/12/2021 0726 Last data filed at 06/12/2021 0400 Gross per 24 hour  Intake 11.1 ml  Output 3450 ml  Net -3438.9 ml   Last 3 Weights 06/12/2021 06/11/2021 06/10/2021  Weight (lbs) 205 lb 7.5 oz 212 lb 8.4 oz 214 lb 11.7 oz  Weight (kg) 93.2 kg 96.4 kg 97.4 kg      Telemetry    NSR - Personally Reviewed  ECG    No new data - Personally Reviewed  Physical Exam  Unchanged from yesterday GEN: No  acute distress.   Neck: No JVD Cardiac: RRR, no murmurs, rubs, or gallops.  Radial cath site is unremarkable. Right radial cath site looks okay with TR band. Respiratory: Clear to auscultation bilaterally. GI: Soft, nontender, non-distended  MS: No edema; No deformity. Neuro:  Nonfocal  Psych: Normal affect   Labs    High Sensitivity Troponin:   Recent Labs  Lab 06/07/21 1225 06/07/21 1535 06/08/21 0725 06/08/21 1054  TROPONINIHS 173* 845* 10,056* 8,206*      Chemistry Recent Labs  Lab 06/07/21 1225 06/08/21 0104 06/10/21 0902 06/11/21 0150 06/12/21 0055  NA 136   < > 133* 134* 137  K 3.3*   < > 3.4* 3.5 4.1  CL 105   < > 104 107 107  CO2 22   < > 24 22 23   GLUCOSE 238*   < > 241* 166* 139*  BUN 25*   < > 14 17 15   CREATININE 1.86*   < > 1.87* 1.94* 1.91*  CALCIUM 8.9   < > 8.0* 7.7* 8.4*  PROT 6.8  --   --   --   --  ALBUMIN 3.7  --   --   --   --   AST 22  --   --   --   --   ALT 15  --   --   --   --   ALKPHOS 93  --   --   --   --   BILITOT 0.7  --   --   --   --   GFRNONAA 39*   < > 39* 37* 38*  ANIONGAP 9   < > 5 5 7    < > = values in this interval not displayed.     Hematology Recent Labs  Lab 06/10/21 0106 06/11/21 0150 06/12/21 0055  WBC 12.3* 8.9 8.9  RBC 3.47* 3.10* 3.44*  HGB 10.2* 9.3* 10.1*  HCT 29.5* 26.7* 29.9*  MCV 85.0 86.1 86.9  MCH 29.4 30.0 29.4  MCHC 34.6 34.8 33.8  RDW 13.4 13.4 13.7  PLT 194 158 178    BNP Recent Labs  Lab 06/08/21 0724  BNP 217.1*     DDimer No results for input(s): DDIMER in the last 168 hours.   Radiology    CARDIAC CATHETERIZATION  Result Date: 06/11/2021 Formatting of this result is different from the original.   1st Mrg lesion is 95% stenosed.   A drug-eluting stent was successfully placed using a STENT ONYX FRONTIER 2.75X26.   Post intervention, there is a 0% residual stenosis.   Ost Cx to Prox Cx lesion is 65% stenosed.   A drug-eluting stent was successfully placed using a STENT ONYX  FRONTIER 3.0X22.   Post intervention, there is a 0% residual stenosis. Successful Bifurcation stenting of the proximal to mid LCx and first OM using IVUS guidance and Cullotte technique with DES x 2 Plan: DAPT for one year. Aggressive risk factor modification. Will treat his other disease medically.    Cardiac Studies   See echo above. EF 60% PCI 06/11/2021: Intervention     Patient Profile     65 y.o. male with primary hypertension, type 2 diabetes, [name]-b, normal LVEF, and non-ST elevation MI with symptoms developing over the 72 hours prior to admission.  Assessment & Plan    NSTEMI: Successful CFX PCI. Likely DC today. Must ambulate. CKD III-b: Creat stable at 1.91. Needs eval for RAS. Needs diuretic added but not until renal function settles after contrast. Increase Add amlodipine today. DM II: continue Farxiga 10 mg daily. Primary hypertension: Will up titrate clonidine to 0.3 mg BID Lipids: High intensity statin   Home today. OP assessment for RAS. Probably add furosemide when renal function stable.     For questions or updates, please contact [place] Please consult [email or web address] for contact info under        Signed, Sinclair Grooms, MD  06/12/2021, 7:26 AM

## 2021-06-12 NOTE — Progress Notes (Signed)
[facility] Progress Note   65 y.o. male HTN DM HLD CKD3 followed by Dr. Carson Myrtle presenting to an outside ED with left sided substernal chest pain radiating down the left arm with negative myocardial perfusion stress test 04/2021. Increasing trend in troponins + heparin before being transferred to [facility]. His creatinine was 1.25 05/08/2020 with an episode of AKI a month later in 05/2020 with incr to the 2.5 range. Cr was 1.86 on 7/24 but has been steadily increasing.   Assessment/ Plan:   Renal failure with acute on CKD3 followed by [name] with h/o AKI 05/2020 which appears to have been secondary to an Abx being given for a toe infection and he was tole 3 weeks ago that his renal function had improved. His BL Cr appears to be in the 1.7-2 range and I attempted to call  [phone] to confirm. He appears to be euvolemic and I would hold off on diuresis as he appears to be headed for a cath tomorrow. Certainly at risk for AKI/ RRT with contrast and risk would increase with prerenal azotemia. - Fortunately renal function remains  stable after  PCI 7/28 with 22ml of contrast + great UOP. - Eventually need to restart diuretics but no need with the high volume UOP seen over past 24hrs.  From renal standpoint stable for D/C. NSTEMI - on ASA/ statin/ nitro/ heparin/ BBlr s/p PCI on 7/28 HTN - uptitrating medical therapy. DM  Subjective:   No further CP. Mild  dyspnea. Ambulating comfortably.    Objective:   BP 106/71   Pulse (!) 58   Temp 98.1 F (36.7 C) (Oral)   Resp 14   Ht 6' (1.829 m)   Wt 93.2 kg   SpO2 99%   BMI 27.87 kg/m   Intake/Output Summary (Last 24 hours) at 06/12/2021 1046 Last data filed at 06/12/2021 0400 Gross per 24 hour  Intake 11.1 ml  Output 3450 ml  Net -3438.9 ml   Weight change: -3.2 kg  Physical Exam: General appearance: alert, cooperative Head: NCAT Back: symmetric, no curvature. ROM normal. No CVA tenderness. Resp: clear to  auscultation bilaterally Cardio: regular rate and rhythm GI: SNDNT + BS Extremities: edema none  Imaging: CARDIAC CATHETERIZATION  Result Date: 06/11/2021 Formatting of this result is different from the original.   1st Mrg lesion is 95% stenosed.   A drug-eluting stent was successfully placed using a STENT ONYX FRONTIER 2.75X26.   Post intervention, there is a 0% residual stenosis.   Ost Cx to Prox Cx lesion is 65% stenosed.   A drug-eluting stent was successfully placed using a STENT ONYX FRONTIER 3.0X22.   Post intervention, there is a 0% residual stenosis. Successful Bifurcation stenting of the proximal to mid LCx and first OM using IVUS guidance and Cullotte technique with DES x 2 Plan: DAPT for one year. Aggressive risk factor modification. Will treat his other disease medically.    Labs: BMET Recent Labs  Lab 06/08/21 0104 06/08/21 0725 06/09/21 0504 06/09/21 0943 06/10/21 0902 06/11/21 0150 06/12/21 0055  NA 136 135 137 136 133* 134* 137  K 3.3* 3.3* 3.6 3.7 3.4* 3.5 4.1  CL 105 104 107 108 104 107 107  CO2 24 25 25 24 24 22 23   GLUCOSE 219* 218* 145* 133* 241* 166* 139*  BUN 22 25* 19 17 14 17 15   CREATININE 2.19* 2.23* 1.95* 1.84* 1.87* 1.94* 1.91*  CALCIUM 8.6* 8.6* 8.2* 8.2* 8.0* 7.7* 8.4*   CBC Recent Labs  Lab 06/07/21 1225 06/08/21 0104 06/09/21 0220 06/10/21 0106 06/11/21 0150 06/12/21 0055  WBC 11.4*   < > 12.1* 12.3* 8.9 8.9  NEUTROABS 8.3*  --   --   --   --   --   HGB 13.5   < > 10.5* 10.2* 9.3* 10.1*  HCT 37.9*   < > 29.5* 29.5* 26.7* 29.9*  MCV 83.8   < > 85.3 85.0 86.1 86.9  PLT 212   < > 191 194 158 178   < > = values in this interval not displayed.    Medications:     amLODipine  5 mg Oral Daily   aspirin EC  81 mg Oral Daily   atorvastatin  80 mg Oral Daily   carvedilol  12.5 mg Oral BID WC   Chlorhexidine Gluconate Cloth  6 each Topical Daily   cloNIDine  0.3 mg Oral BID   dapagliflozin propanediol  10 mg Oral Daily   enoxaparin  (LOVENOX) injection  40 mg Subcutaneous Q24H   gabapentin  100 mg Oral BID   insulin aspart  0-15 Units Subcutaneous TID WC   insulin aspart  0-5 Units Subcutaneous QHS   sodium chloride flush  3 mL Intravenous Q12H   sodium chloride flush  3 mL Intravenous Q12H   sodium chloride flush  3 mL Intravenous Q12H   tamsulosin  0.4 mg Oral QPC supper   ticagrelor  90 mg Oral BID      Otelia Santee, MD 06/12/2021, 10:46 AM

## 2021-06-16 ENCOUNTER — Other Ambulatory Visit (HOSPITAL_COMMUNITY): Payer: Self-pay

## 2021-06-16 ENCOUNTER — Telehealth (HOSPITAL_COMMUNITY): Payer: Self-pay

## 2021-06-16 NOTE — Telephone Encounter (Signed)
Pharmacy Transitions of Care Follow-up Telephone Call  Date of discharge: 06/12/21  Discharge Diagnosis: NSTEMI  How have you been since you were released from the hospital?  Patient has been doing well since discharge and has been taking it easy since discharge.  Medication changes made at discharge:      START taking: Aspirin Low Dose (aspirin)  atorvastatin (LIPITOR)  Brilinta (ticagrelor)  carvedilol (COREG)  Jardiance (empagliflozin)  nitroGLYCERIN (Nitrostat)   CHANGE how you take: amLODipine (NORVASC)   STOP taking: furosemide 20 MG tablet (LASIX)  losartan 25 MG tablet (COZAAR)  ondansetron 8 MG disintegrating tablet (ZOFRAN-ODT)  sildenafil 25 MG tablet (Viagra)   Medication changes verified by the patient? Yes    Medication Accessibility:  Home Pharmacy:  [facility], [place]  Was the patient provided with refills on discharged medications? Yes   Have all prescriptions been transferred from [facility] to home pharmacy?  Yes  Is the patient able to afford medications? Patient has [employer]    Medication Review:  TICAGRELOR (BRILINTA) Ticagrelor 90 mg BID initiated on 06/12/21.  - Educated patient on expected duration of therapy of aspirin with ticagrelor.  - Discussed importance of taking medication around the same time every day - Advised patient of medications to avoid (NSAIDs, aspirin maintenance doses>100 mg daily) - Educated that Tylenol (acetaminophen) will be the preferred analgesic to prevent risk of bleeding  - Emphasized importance of monitoring for signs and symptoms of bleeding (abnormal bruising, prolonged bleeding, nose bleeds, bleeding from gums, discolored urine, black tarry stools)  - Educated patient to notify doctor if shortness of breath or abnormal heartbeat occur - Advised patient to alert all providers of antiplatelet therapy prior to starting a new medication or having a procedure   Follow-up Appointments:  PCP  Hospital f/u appt confirmed?  Scheduled to see Family Medicine on 07/01/21  Specialist Hospital f/u appt confirmed? Scheduled to see Dr. Percival Spanish on 07/02/21 @ Cardiology.   If their condition worsens, is the pt aware to call PCP or go to the Emergency Dept.? Yes  Final Patient Assessment: Patient has follow up scheduled and refills at home pharmacy

## 2021-06-25 ENCOUNTER — Other Ambulatory Visit: Payer: Self-pay

## 2021-06-25 ENCOUNTER — Encounter: Payer: Self-pay | Admitting: Family Medicine

## 2021-06-25 ENCOUNTER — Ambulatory Visit (INDEPENDENT_AMBULATORY_CARE_PROVIDER_SITE_OTHER): Payer: PPO | Admitting: Family Medicine

## 2021-06-25 VITALS — BP 151/71 | HR 60 | Temp 97.5°F | Ht 72.0 in | Wt 201.6 lb

## 2021-06-25 DIAGNOSIS — N1832 Chronic kidney disease, stage 3b: Secondary | ICD-10-CM | POA: Diagnosis not present

## 2021-06-25 DIAGNOSIS — I2510 Atherosclerotic heart disease of native coronary artery without angina pectoris: Secondary | ICD-10-CM | POA: Insufficient documentation

## 2021-06-25 DIAGNOSIS — I214 Non-ST elevation (NSTEMI) myocardial infarction: Secondary | ICD-10-CM | POA: Diagnosis not present

## 2021-06-25 DIAGNOSIS — E1159 Type 2 diabetes mellitus with other circulatory complications: Secondary | ICD-10-CM

## 2021-06-25 DIAGNOSIS — Z09 Encounter for follow-up examination after completed treatment for conditions other than malignant neoplasm: Secondary | ICD-10-CM

## 2021-06-25 DIAGNOSIS — I152 Hypertension secondary to endocrine disorders: Secondary | ICD-10-CM

## 2021-06-25 DIAGNOSIS — E1122 Type 2 diabetes mellitus with diabetic chronic kidney disease: Secondary | ICD-10-CM

## 2021-06-25 DIAGNOSIS — N183 Chronic kidney disease, stage 3 unspecified: Secondary | ICD-10-CM

## 2021-06-25 NOTE — Progress Notes (Signed)
Subjective:  Patient ID: [name], male    DOB: Mar 05, 1956, 65 y.o.   MRN: [id number]  Patient Care Team: [name], DO as PCP - General (Family Medicine) Minus Breeding, MD as PCP - Cardiology (Cardiology) Liana Gerold, MD as Consulting Physician (Nephrology) Ilean China, RN as Case Manager Ulla Gallo, MD as Consulting Physician (Dermatology)   Chief Complaint:  Hospitalization Follow-up   HPI: [name] is a 65 y.o. male presenting on 06/25/2021 for Hospitalization Follow-up  Today's visit was for Transitional Care Management.  The patient was discharged from [facility] on 06/12/2021 with a primary diagnosis of NSTEMI.   Contact with the patient and/or caregiver, by a clinical staff member, was made on 06/16/2021 and was documented as a telephone encounter within the EMR.  Through chart review and discussion with the patient I have determined that management of their condition is of high complexity.    Pt presents today for follow up after recent hospital admission for NSTEMI. Pt was seen by cardiology for chest pain 04/08/2021 and had negative nuclear stress testing 04/28/2021. Pt reports he woke up with CP on 06/07/2021 and went to UC where EKG revealed ST depression in multiple leads. Pt was taken to ED at [facility] for CP. EKG was non-ischemic but HS troponin peaked at 10,056. Echo 06/08/2021 revealed LVEF 60-65%, mild LVH, and mild mitral valve regurgitation. [place] 06/09/2021 revealed severe 3-vessel CAD. Pt underwent staged PCI 06/11/2021 with successful bifurcation stenting of the proximal to mid LCx and OM with DES x 2. Other lesions were not felt to be amenable to PCI/CABG and will be managed medically. Right radial access for both procedures. He will be on DAPT therapy for one year. Renal function was noted to be declined during admission, discharge creatinine 1.91, eGFR 38. BP was labile during admission and slightly elevated today.  Repeat manual BP 138/76. EKG in office today non-ischemic. A1C during admission 6.5. He states he has been doing well since discharge. He does report easy bruising with DAPT therapy but denies gums bleeding, hematuria, or rectal bleeding.  He has cardiology follow up scheduled.   Relevant past medical, surgical, family, and social history reviewed and updated as indicated.  Allergies and medications reviewed and updated. Date reviewed: Chart in Epic.   Past Medical History:  Diagnosis Date   Chronic kidney disease    Complication of anesthesia    Hard to wake   Degenerative joint disease (DJD) of lumbar spine    Diabetes mellitus without complication ([place])    Type II   Essential hypertension 02/23/2017   Lumbar stenosis    NSTEMI (non-ST elevated myocardial infarction) ([place]) 06/07/2021    Past Surgical History:  Procedure Laterality Date   AMPUTATION TOE Right 05/17/2020   Procedure: AMPUTATION TOE partial right great toe;  Surgeon: Evelina Bucy, DPM;  Location: [facility];  Service: Podiatry;  Laterality: Right;   BELPHAROPTOSIS REPAIR     CARDIAC CATHETERIZATION     CORONARY STENT INTERVENTION N/A 06/11/2021   Procedure: CORONARY STENT INTERVENTION;  Surgeon: Martinique, Peter M, MD;  Location: [place] [facility];  Service: Cardiovascular;  Laterality: N/A;   EYE SURGERY     HEMORRHOID SURGERY     INTRAVASCULAR ULTRASOUND/IVUS N/A 06/11/2021   Procedure: Intravascular Ultrasound/IVUS;  Surgeon: Martinique, Peter M, MD;  Location: [place] [facility];  Service: Cardiovascular;  Laterality: N/A;   LEFT HEART CATH AND CORONARY ANGIOGRAPHY N/A 06/09/2021  Procedure: LEFT HEART CATH AND CORONARY ANGIOGRAPHY;  Surgeon: Martinique, Peter M, MD;  Location: [place] [facility];  Service: Cardiovascular;  Laterality: N/A;   skin cancer removed right ear     TRANSFORAMINAL LUMBAR INTERBODY FUSION (TLIF) WITH PEDICLE SCREW FIXATION 2 LEVEL N/A 07/06/2018   Procedure: TRANSFORAMINAL LUMBAR INTERBODY FUSION  (TLIF) L4-S1;  Surgeon: Melina Schools, MD;  Location: [place];  Service: Orthopedics;  Laterality: N/A;    Social History   Socioeconomic History   Marital status: Married    Spouse name: Not on file   Number of children: 3   Years of education: Not on file   Highest education level: Not on file  Occupational History   Not on file  Tobacco Use   Smoking status: Never   Smokeless tobacco: Never  Vaping Use   Vaping Use: Never used  Substance and Sexual Activity   Alcohol use: Yes    Alcohol/week: 6.0 standard drinks    Types: 6 Cans of beer per week   Drug use: No   Sexual activity: Yes  Other Topics Concern   Not on file  Social History Narrative   Lives with his wife - Has a son with cirrhosis who he stays with a lot. Has another son and one daughter    Social Determinants of Radio broadcast assistant Strain: Low Risk    Difficulty of Paying Living Expenses: Not hard at all  Food Insecurity: No Food Insecurity   Worried About Charity fundraiser in the Last Year: Never true   Arboriculturist in the Last Year: Never true  Transportation Needs: No Transportation Needs   Lack of Transportation (Medical): No   Lack of Transportation (Non-Medical): No  Physical Activity: Sufficiently Active   Days of Exercise per Week: 7 days   Minutes of Exercise per Session: 60 min  Stress: No Stress Concern Present   Feeling of Stress : Not at all  Social Connections: Moderately Isolated   Frequency of Communication with Friends and Family: More than three times a week   Frequency of Social Gatherings with Friends and Family: More than three times a week   Attends Religious Services: Never   Marine scientist or Organizations: No   Attends Archivist Meetings: Never   Marital Status: Married  Human resources officer Violence: Not At Risk   Fear of Current or Ex-Partner: No   Emotionally Abused: No   Physically Abused: No   Sexually Abused: No    Outpatient Encounter  Medications as of 06/25/2021  Medication Sig   amLODipine (NORVASC) 5 MG tablet Take 1 tablet (5 mg total) by mouth daily.   aspirin 81 MG EC tablet Take 1 tablet (81 mg total) by mouth daily. Swallow whole.   atorvastatin (LIPITOR) 80 MG tablet Take 1 tablet (80 mg total) by mouth daily.   carvedilol (COREG) 12.5 MG tablet Take 1 tablet (12.5 mg total) by mouth 2 (two) times daily with a meal.   cloNIDine (CATAPRES) 0.3 MG tablet Take 1 tablet (0.3 mg total) by mouth 2 (two) times daily.   empagliflozin (JARDIANCE) 10 MG TABS tablet Take 1 tablet (10 mg total) by mouth daily before breakfast.   gabapentin (NEURONTIN) 100 MG capsule Take 1 capsule (100 mg total) by mouth 2 (two) times daily.   Insulin Pen Needle (PEN NEEDLES) 31G X 5 MM MISC Use daily with insulin Dx E11.9   nitroGLYCERIN (NITROSTAT) 0.4 MG SL tablet  Place 1 tablet (0.4 mg total) under the tongue every 5 (five) minutes as needed for chest pain.   tamsulosin (FLOMAX) 0.4 MG CAPS capsule Take 1 capsule (0.4 mg total) by mouth daily after supper.   ticagrelor (BRILINTA) 90 MG TABS tablet Take 1 tablet (90 mg total) by mouth 2 (two) times daily.   TRESIBA FLEXTOUCH 100 UNIT/ML FlexTouch Pen INJECT UP TO 36 UNITS EVERY MORNING. (MAY INCREASE BY 1 UNIT EVERY OTHER DAY TIL FBS BELOW 150)   [DISCONTINUED] cholecalciferol (VITAMIN D3) 25 MCG (1000 UNIT) tablet Take 2,000 Units by mouth daily.   [DISCONTINUED] ferrous sulfate 325 (65 FE) MG tablet Take 325 mg by mouth daily with breakfast.   [DISCONTINUED] loratadine (CLARITIN) 10 MG tablet Take 1 tablet (10 mg total) by mouth every other day. (renally dosed)   [DISCONTINUED] vitamin B-12 (CYANOCOBALAMIN) 500 MCG tablet Take 500 mcg by mouth daily.   No facility-administered encounter medications on file as of 06/25/2021.    No Known Allergies  Review of Systems  Constitutional:  Positive for fatigue (minimal). Negative for activity change, appetite change, chills, diaphoresis, fever  and unexpected weight change.  HENT: Negative.    Eyes: Negative.   Respiratory:  Negative for cough, chest tightness and shortness of breath.   Cardiovascular:  Negative for chest pain, palpitations and leg swelling.  Gastrointestinal:  Negative for abdominal pain, anal bleeding, blood in stool, constipation, diarrhea, nausea and vomiting.  Endocrine: Negative.  Negative for polydipsia, polyphagia and polyuria.  Genitourinary:  Negative for dysuria, frequency, hematuria and urgency.  Musculoskeletal:  Negative for arthralgias and myalgias.  Skin: Negative.  Negative for pallor.  Allergic/Immunologic: Negative.   Neurological:  Negative for dizziness, weakness and headaches.  Hematological:  Negative for adenopathy. Bruises/bleeds easily.  Psychiatric/Behavioral:  Negative for confusion, hallucinations, sleep disturbance and suicidal ideas.   All other systems reviewed and are negative.      Objective:  BP (!) 151/71   Pulse 60   Temp (!) 97.5 F (36.4 C)   Ht 6' (1.829 m)   Wt 201 lb 9.6 oz (91.4 kg)   SpO2 100%   BMI 27.34 kg/m    Wt Readings from Last 3 Encounters:  06/25/21 201 lb 9.6 oz (91.4 kg)  06/12/21 205 lb 7.5 oz (93.2 kg)  04/08/21 205 lb (93 kg)    Physical Exam Vitals and nursing note reviewed.  Constitutional:      General: He is not in acute distress.    Appearance: Normal appearance. He is well-developed and well-groomed. He is not ill-appearing, toxic-appearing or diaphoretic.  HENT:     Head: Normocephalic and atraumatic.     Jaw: There is normal jaw occlusion.     Right Ear: Hearing normal.     Left Ear: Hearing normal.     Nose: Nose normal.     Mouth/Throat:     Lips: Pink.     Mouth: Mucous membranes are moist.     Pharynx: Oropharynx is clear. Uvula midline.  Eyes:     General: Lids are normal.     Extraocular Movements: Extraocular movements intact.     Conjunctiva/sclera: Conjunctivae normal.     Pupils: Pupils are equal, round, and  reactive to light.  Neck:     Thyroid: No thyroid mass, thyromegaly or thyroid tenderness.     Vascular: No carotid bruit or JVD.     Trachea: Trachea and phonation normal.  Cardiovascular:     Rate and Rhythm: Normal rate  and regular rhythm.     Chest Wall: PMI is not displaced.     Pulses: Normal pulses.          Radial pulses are 2+ on the right side.     Heart sounds: Murmur heard.  Systolic murmur is present with a grade of 1/6.    No friction rub. No gallop.     Comments: Right radial procedure site well healed, no bleeding, swelling, or tenderness Pulmonary:     Effort: Pulmonary effort is normal. No respiratory distress.     Breath sounds: Normal breath sounds. No wheezing.  Abdominal:     General: Bowel sounds are normal. There is no distension or abdominal bruit.     Palpations: Abdomen is soft. There is no hepatomegaly, splenomegaly or mass.     Tenderness: There is no abdominal tenderness. There is no right CVA tenderness, left CVA tenderness, guarding or rebound.     Hernia: No hernia is present.     Comments: Protuberant  Musculoskeletal:        General: Normal range of motion.     Cervical back: Normal range of motion and neck supple.     Right lower leg: No edema.     Left lower leg: No edema.  Lymphadenopathy:     Cervical: No cervical adenopathy.  Skin:    General: Skin is warm and dry.     Capillary Refill: Capillary refill takes less than 2 seconds.     Coloration: Skin is not cyanotic, jaundiced or pale.     Findings: No rash.  Neurological:     General: No focal deficit present.     Mental Status: He is alert and oriented to person, place, and time.     Cranial Nerves: Cranial nerves are intact. No cranial nerve deficit.     Sensory: Sensation is intact. No sensory deficit.     Motor: Motor function is intact. No weakness.     Coordination: Coordination is intact. Coordination normal.     Gait: Gait is intact. Gait normal.     Deep Tendon Reflexes:  Reflexes are normal and symmetric. Reflexes normal.  Psychiatric:        Attention and Perception: Attention and perception normal.        Mood and Affect: Mood and affect normal.        Speech: Speech normal.        Behavior: Behavior normal. Behavior is cooperative.        Thought Content: Thought content normal.        Cognition and Memory: Cognition and memory normal.        Judgment: Judgment normal.    Results for orders placed or performed during the hospital encounter of 06/07/21  Resp Panel by RT-PCR (Flu A&B, Covid) Nasopharyngeal Swab   Specimen: Nasopharyngeal Swab; Nasopharyngeal(NP) swabs in vial transport medium  Result Value Ref Range   SARS Coronavirus 2 by RT PCR NEGATIVE NEGATIVE   Influenza A by PCR NEGATIVE NEGATIVE   Influenza B by PCR NEGATIVE NEGATIVE  MRSA Next Gen by PCR, Nasal   Specimen: Nasal Mucosa; Nasal Swab  Result Value Ref Range   MRSA by PCR Next Gen NOT DETECTED NOT DETECTED  Comprehensive metabolic panel  Result Value Ref Range   Sodium 136 135 - 145 mmol/L   Potassium 3.3 (L) 3.5 - 5.1 mmol/L   Chloride 105 98 - 111 mmol/L   CO2 22 22 - 32 mmol/L   Glucose,  Bld 238 (H) 70 - 99 mg/dL   BUN 25 (H) 8 - 23 mg/dL   Creatinine, Ser 1.86 (H) 0.61 - 1.24 mg/dL   Calcium 8.9 8.9 - 10.3 mg/dL   Total Protein 6.8 6.5 - 8.1 g/dL   Albumin 3.7 3.5 - 5.0 g/dL   AST 22 15 - 41 U/L   ALT 15 0 - 44 U/L   Alkaline Phosphatase 93 38 - 126 U/L   Total Bilirubin 0.7 0.3 - 1.2 mg/dL   GFR, Estimated 39 (L) >60 mL/min   Anion gap 9 5 - 15  CBC with Differential  Result Value Ref Range   WBC 11.4 (H) 4.0 - 10.5 K/uL   RBC 4.52 4.22 - 5.81 MIL/uL   Hemoglobin 13.5 13.0 - 17.0 g/dL   HCT 37.9 (L) 39.0 - 52.0 %   MCV 83.8 80.0 - 100.0 fL   MCH 29.9 26.0 - 34.0 pg   MCHC 35.6 30.0 - 36.0 g/dL   RDW 13.6 11.5 - 15.5 %   Platelets 212 150 - 400 K/uL   nRBC 0.0 0.0 - 0.2 %   Neutrophils Relative % 72 %   Neutro Abs 8.3 (H) 1.7 - 7.7 K/uL   Lymphocytes  Relative 22 %   Lymphs Abs 2.5 0.7 - 4.0 K/uL   Monocytes Relative 4 %   Monocytes Absolute 0.5 0.1 - 1.0 K/uL   Eosinophils Relative 1 %   Eosinophils Absolute 0.1 0.0 - 0.5 K/uL   Basophils Relative 0 %   Basophils Absolute 0.1 0.0 - 0.1 K/uL   Immature Granulocytes 1 %   Abs Immature Granulocytes 0.06 0.00 - 0.07 K/uL  Heparin level (unfractionated)  Result Value Ref Range   Heparin Unfractionated 0.21 (L) 0.30 - 0.70 IU/mL  Heparin level (unfractionated)  Result Value Ref Range   Heparin Unfractionated 0.13 (L) 0.30 - 0.70 IU/mL  HIV Antibody (routine testing w rflx)  Result Value Ref Range   HIV Screen 4th Generation wRfx Non Reactive Non Reactive  Hemoglobin A1c  Result Value Ref Range   Hgb A1c MFr Bld 6.5 (H) 4.8 - 5.6 %   Mean Plasma Glucose 140 mg/dL  Basic metabolic panel  Result Value Ref Range   Sodium 136 135 - 145 mmol/L   Potassium 3.3 (L) 3.5 - 5.1 mmol/L   Chloride 105 98 - 111 mmol/L   CO2 24 22 - 32 mmol/L   Glucose, Bld 219 (H) 70 - 99 mg/dL   BUN 22 8 - 23 mg/dL   Creatinine, Ser 2.19 (H) 0.61 - 1.24 mg/dL   Calcium 8.6 (L) 8.9 - 10.3 mg/dL   GFR, Estimated 32 (L) >60 mL/min   Anion gap 7 5 - 15  Lipid panel  Result Value Ref Range   Cholesterol 160 0 - 200 mg/dL   Triglycerides 120 <150 mg/dL   HDL 34 (L) >40 mg/dL   Total CHOL/HDL Ratio 4.7 RATIO   VLDL 24 0 - 40 mg/dL   LDL Cholesterol 102 (H) 0 - 99 mg/dL  CBC  Result Value Ref Range   WBC 12.4 (H) 4.0 - 10.5 K/uL   RBC 3.81 (L) 4.22 - 5.81 MIL/uL   Hemoglobin 11.1 (L) 13.0 - 17.0 g/dL   HCT 31.8 (L) 39.0 - 52.0 %   MCV 83.5 80.0 - 100.0 fL   MCH 29.1 26.0 - 34.0 pg   MCHC 34.9 30.0 - 36.0 g/dL   RDW 13.6 11.5 - 15.5 %  Platelets 215 150 - 400 K/uL   nRBC 0.0 0.0 - 0.2 %  Protime-INR  Result Value Ref Range   Prothrombin Time 14.1 11.4 - 15.2 seconds   INR 1.1 0.8 - 1.2  Basic metabolic panel  Result Value Ref Range   Sodium 135 135 - 145 mmol/L   Potassium 3.3 (L) 3.5 - 5.1  mmol/L   Chloride 104 98 - 111 mmol/L   CO2 25 22 - 32 mmol/L   Glucose, Bld 218 (H) 70 - 99 mg/dL   BUN 25 (H) 8 - 23 mg/dL   Creatinine, Ser 2.23 (H) 0.61 - 1.24 mg/dL   Calcium 8.6 (L) 8.9 - 10.3 mg/dL   GFR, Estimated 32 (L) >60 mL/min   Anion gap 6 5 - 15  Glucose, capillary  Result Value Ref Range   Glucose-Capillary 228 (H) 70 - 99 mg/dL  Brain natriuretic peptide  Result Value Ref Range   B Natriuretic Peptide 217.1 (H) 0.0 - 100.0 pg/mL  Glucose, capillary  Result Value Ref Range   Glucose-Capillary 211 (H) 70 - 99 mg/dL  Heparin level (unfractionated)  Result Value Ref Range   Heparin Unfractionated 0.30 0.30 - 0.70 IU/mL  Glucose, capillary  Result Value Ref Range   Glucose-Capillary 221 (H) 70 - 99 mg/dL  Heparin level (unfractionated)  Result Value Ref Range   Heparin Unfractionated 0.36 0.30 - 0.70 IU/mL  CBC  Result Value Ref Range   WBC 12.1 (H) 4.0 - 10.5 K/uL   RBC 3.46 (L) 4.22 - 5.81 MIL/uL   Hemoglobin 10.5 (L) 13.0 - 17.0 g/dL   HCT 29.5 (L) 39.0 - 52.0 %   MCV 85.3 80.0 - 100.0 fL   MCH 30.3 26.0 - 34.0 pg   MCHC 35.6 30.0 - 36.0 g/dL   RDW 13.7 11.5 - 15.5 %   Platelets 191 150 - 400 K/uL   nRBC 0.0 0.0 - 0.2 %  Glucose, capillary  Result Value Ref Range   Glucose-Capillary 171 (H) 70 - 99 mg/dL  Basic metabolic panel  Result Value Ref Range   Sodium 137 135 - 145 mmol/L   Potassium 3.6 3.5 - 5.1 mmol/L   Chloride 107 98 - 111 mmol/L   CO2 25 22 - 32 mmol/L   Glucose, Bld 145 (H) 70 - 99 mg/dL   BUN 19 8 - 23 mg/dL   Creatinine, Ser 1.95 (H) 0.61 - 1.24 mg/dL   Calcium 8.2 (L) 8.9 - 10.3 mg/dL   GFR, Estimated 37 (L) >60 mL/min   Anion gap 5 5 - 15  Glucose, capillary  Result Value Ref Range   Glucose-Capillary 133 (H) 70 - 99 mg/dL  Basic metabolic panel  Result Value Ref Range   Sodium 136 135 - 145 mmol/L   Potassium 3.7 3.5 - 5.1 mmol/L   Chloride 108 98 - 111 mmol/L   CO2 24 22 - 32 mmol/L   Glucose, Bld 133 (H) 70 - 99 mg/dL    BUN 17 8 - 23 mg/dL   Creatinine, Ser 1.84 (H) 0.61 - 1.24 mg/dL   Calcium 8.2 (L) 8.9 - 10.3 mg/dL   GFR, Estimated 40 (L) >60 mL/min   Anion gap 4 (L) 5 - 15  Glucose, capillary  Result Value Ref Range   Glucose-Capillary 110 (H) 70 - 99 mg/dL  Glucose, capillary  Result Value Ref Range   Glucose-Capillary 171 (H) 70 - 99 mg/dL  CBC  Result Value Ref Range   WBC  12.3 (H) 4.0 - 10.5 K/uL   RBC 3.47 (L) 4.22 - 5.81 MIL/uL   Hemoglobin 10.2 (L) 13.0 - 17.0 g/dL   HCT 29.5 (L) 39.0 - 52.0 %   MCV 85.0 80.0 - 100.0 fL   MCH 29.4 26.0 - 34.0 pg   MCHC 34.6 30.0 - 36.0 g/dL   RDW 13.4 11.5 - 15.5 %   Platelets 194 150 - 400 K/uL   nRBC 0.0 0.0 - 0.2 %  Heparin level (unfractionated)  Result Value Ref Range   Heparin Unfractionated 0.25 (L) 0.30 - 0.70 IU/mL  Glucose, capillary  Result Value Ref Range   Glucose-Capillary 156 (H) 70 - 99 mg/dL  Heparin level (unfractionated)  Result Value Ref Range   Heparin Unfractionated 0.39 0.30 - 0.70 IU/mL  Glucose, capillary  Result Value Ref Range   Glucose-Capillary 176 (H) 70 - 99 mg/dL  Basic metabolic panel  Result Value Ref Range   Sodium 133 (L) 135 - 145 mmol/L   Potassium 3.4 (L) 3.5 - 5.1 mmol/L   Chloride 104 98 - 111 mmol/L   CO2 24 22 - 32 mmol/L   Glucose, Bld 241 (H) 70 - 99 mg/dL   BUN 14 8 - 23 mg/dL   Creatinine, Ser 1.87 (H) 0.61 - 1.24 mg/dL   Calcium 8.0 (L) 8.9 - 10.3 mg/dL   GFR, Estimated 39 (L) >60 mL/min   Anion gap 5 5 - 15  Glucose, capillary  Result Value Ref Range   Glucose-Capillary 262 (H) 70 - 99 mg/dL  Glucose, capillary  Result Value Ref Range   Glucose-Capillary 205 (H) 70 - 99 mg/dL  CBC  Result Value Ref Range   WBC 8.9 4.0 - 10.5 K/uL   RBC 3.10 (L) 4.22 - 5.81 MIL/uL   Hemoglobin 9.3 (L) 13.0 - 17.0 g/dL   HCT 26.7 (L) 39.0 - 52.0 %   MCV 86.1 80.0 - 100.0 fL   MCH 30.0 26.0 - 34.0 pg   MCHC 34.8 30.0 - 36.0 g/dL   RDW 13.4 11.5 - 15.5 %   Platelets 158 150 - 400 K/uL   nRBC  0.0 0.0 - 0.2 %  Heparin level (unfractionated)  Result Value Ref Range   Heparin Unfractionated 0.34 0.30 - 0.70 IU/mL  Basic metabolic panel  Result Value Ref Range   Sodium 134 (L) 135 - 145 mmol/L   Potassium 3.5 3.5 - 5.1 mmol/L   Chloride 107 98 - 111 mmol/L   CO2 22 22 - 32 mmol/L   Glucose, Bld 166 (H) 70 - 99 mg/dL   BUN 17 8 - 23 mg/dL   Creatinine, Ser 1.94 (H) 0.61 - 1.24 mg/dL   Calcium 7.7 (L) 8.9 - 10.3 mg/dL   GFR, Estimated 37 (L) >60 mL/min   Anion gap 5 5 - 15  Glucose, capillary  Result Value Ref Range   Glucose-Capillary 193 (H) 70 - 99 mg/dL  Glucose, capillary  Result Value Ref Range   Glucose-Capillary 164 (H) 70 - 99 mg/dL  Glucose, capillary  Result Value Ref Range   Glucose-Capillary 168 (H) 70 - 99 mg/dL  Basic metabolic panel  Result Value Ref Range   Sodium 137 135 - 145 mmol/L   Potassium 4.1 3.5 - 5.1 mmol/L   Chloride 107 98 - 111 mmol/L   CO2 23 22 - 32 mmol/L   Glucose, Bld 139 (H) 70 - 99 mg/dL   BUN 15 8 - 23 mg/dL   Creatinine, Ser  1.91 (H) 0.61 - 1.24 mg/dL   Calcium 8.4 (L) 8.9 - 10.3 mg/dL   GFR, Estimated 38 (L) >60 mL/min   Anion gap 7 5 - 15  CBC  Result Value Ref Range   WBC 8.9 4.0 - 10.5 K/uL   RBC 3.44 (L) 4.22 - 5.81 MIL/uL   Hemoglobin 10.1 (L) 13.0 - 17.0 g/dL   HCT 29.9 (L) 39.0 - 52.0 %   MCV 86.9 80.0 - 100.0 fL   MCH 29.4 26.0 - 34.0 pg   MCHC 33.8 30.0 - 36.0 g/dL   RDW 13.7 11.5 - 15.5 %   Platelets 178 150 - 400 K/uL   nRBC 0.0 0.0 - 0.2 %  Glucose, capillary  Result Value Ref Range   Glucose-Capillary 144 (H) 70 - 99 mg/dL  Glucose, capillary  Result Value Ref Range   Glucose-Capillary 147 (H) 70 - 99 mg/dL  Glucose, capillary  Result Value Ref Range   Glucose-Capillary 114 (H) 70 - 99 mg/dL  Glucose, capillary  Result Value Ref Range   Glucose-Capillary 203 (H) 70 - 99 mg/dL  POCT Activated clotting time  Result Value Ref Range   Activated Clotting Time 300 seconds  POCT Activated clotting time   Result Value Ref Range   Activated Clotting Time 532 seconds  POCT Activated clotting time  Result Value Ref Range   Activated Clotting Time 254 seconds  ECHOCARDIOGRAM COMPLETE  Result Value Ref Range   Weight 3,360 oz   Height 72 in   BP 145/74 mmHg   Single Plane A2C EF 56.6 %   Single Plane A4C EF 56.0 %   Calc EF 53.6 %   S' Lateral 3.30 cm   Area-P 1/2 3.74 cm2   MV VTI 1.54 cm2  Troponin I (High Sensitivity)  Result Value Ref Range   Troponin I (High Sensitivity) 173 (HH) <18 ng/L  Troponin I (High Sensitivity)  Result Value Ref Range   Troponin I (High Sensitivity) 845 (HH) <18 ng/L  Troponin I (High Sensitivity)  Result Value Ref Range   Troponin I (High Sensitivity) 10,056 (HH) <18 ng/L  Troponin I (High Sensitivity)  Result Value Ref Range   Troponin I (High Sensitivity) 8,206 (HH) <18 ng/L    EKG in office without acute ST-T changes or ectopy, SR.   Pertinent labs & imaging results that were available during my care of the patient were reviewed by me and considered in my medical decision making.  Assessment & Plan:  [name] was seen today for hospitalization follow-up.  Diagnoses and all orders for this visit:  NSTEMI (non-ST elevated myocardial infarction) ([place]) CAD, multiple vessel S/P PCI with DES x 2. Right radial access site well healed. EKG in office without acute ST-T changes or ectopy, non-ischemic. Denies chest pain. Will be on DAPT therapy for one year without interruptions. Has follow up with cardiology scheduled. -     EKG; Future  CKD stage 3 due to type 2 diabetes mellitus (HCC) Creatinine 1.91 at time of discharge. Denies changes in urinary output. Will recheck BMP today.  -     BMP8+EGFR  Type 2 diabetes mellitus with stage 3b chronic kidney disease, without long-term current use of insulin (HCC) A1C 6.5 during hospital admission. Renal function was declined during admission, will recheck BMP as pt underwent cardiac cath x 2 during  admission.  -     BMP8+EGFR  Hypertension associated with diabetes ([place]) Repeat manual BP 138/76. Limit sodium intake. Monitor BP and  report any persistent high readings.  [facility] discharge follow-up Doing well since discharge. Follow up BMP ordered. EKG in office without acute ischemic changes.  -     BMP8+EGFR -     EKG; Future    Continue all other maintenance medications.  Follow up plan: Return in about 3 months (around 09/25/2021), or if symptoms worsen or fail to improve.   Continue healthy lifestyle choices, including diet (rich in fruits, vegetables, and lean proteins, and low in salt and simple carbohydrates) and exercise (at least 30 minutes of moderate physical activity daily).  Educational handout given for CAD  The above assessment and management plan was discussed with the patient. The patient verbalized understanding of and has agreed to the management plan. Patient is aware to call the clinic if they develop any new symptoms or if symptoms persist or worsen. Patient is aware when to return to the clinic for a follow-up visit. Patient educated on when it is appropriate to go to the emergency department.   Monia Pouch, FNP-C [facility] [phone]

## 2021-07-01 ENCOUNTER — Encounter: Payer: Self-pay | Admitting: *Deleted

## 2021-07-01 ENCOUNTER — Ambulatory Visit (INDEPENDENT_AMBULATORY_CARE_PROVIDER_SITE_OTHER): Payer: PPO | Admitting: *Deleted

## 2021-07-01 DIAGNOSIS — E1122 Type 2 diabetes mellitus with diabetic chronic kidney disease: Secondary | ICD-10-CM | POA: Diagnosis not present

## 2021-07-01 DIAGNOSIS — N1832 Chronic kidney disease, stage 3b: Secondary | ICD-10-CM

## 2021-07-01 DIAGNOSIS — I152 Hypertension secondary to endocrine disorders: Secondary | ICD-10-CM | POA: Diagnosis not present

## 2021-07-01 DIAGNOSIS — E1159 Type 2 diabetes mellitus with other circulatory complications: Secondary | ICD-10-CM

## 2021-07-01 DIAGNOSIS — I214 Non-ST elevation (NSTEMI) myocardial infarction: Secondary | ICD-10-CM | POA: Diagnosis not present

## 2021-07-01 DIAGNOSIS — I2510 Atherosclerotic heart disease of native coronary artery without angina pectoris: Secondary | ICD-10-CM

## 2021-07-01 NOTE — Progress Notes (Signed)
Cardiology Office Note   Date:  07/02/2021   ID:  [name], [name] 1956-03-22, MRN [id number]  PCP:  [name], DO  Cardiologist:   Minus Breeding, MD Referring:  [name], DO  Chief Complaint  Patient presents with   Coronary Artery Disease       History of Present Illness: [name] is a 65 y.o. male who presents for follow up of CAD.  I saw him previously and he had chest pain but a negative perfusions study.  He presented a few weeks after this with chest pain.   He ruled in for NSTEMI and was found to have disease as below and is status post PTCA.  Of note his EF was normal.    He reports that since his procedure he has had some fleeting chest discomfort but nothing like today he presented.  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  However, he denies any new cardiovascular symptoms such as chest pressure, neck or arm discomfort.  He has had no new shortness of breath, PND or orthopnea.  He said no palpitations, presyncope or syncope.  He has not had to take nitroglycerin.  He is concerned because he notes his blood sugars to be going up.  He also shows me a blood pressure diary and his pressures are not at target.   Past Medical History:  Diagnosis Date   Chronic kidney disease    Complication of anesthesia    Hard to wake   Degenerative joint disease (DJD) of lumbar spine    Diabetes mellitus without complication ([place])    Type II   Essential hypertension 02/23/2017   Lumbar stenosis    NSTEMI (non-ST elevated myocardial infarction) ([place]) 06/07/2021    Past Surgical History:  Procedure Laterality Date   AMPUTATION TOE Right 05/17/2020   Procedure: AMPUTATION TOE partial right great toe;  Surgeon: Evelina Bucy, DPM;  Location: [facility];  Service: Podiatry;  Laterality: Right;   BELPHAROPTOSIS REPAIR     CARDIAC CATHETERIZATION     CORONARY STENT INTERVENTION N/A 06/11/2021   Procedure:  CORONARY STENT INTERVENTION;  Surgeon: Martinique, Peter M, MD;  Location: [facility];  Service: Cardiovascular;  Laterality: N/A;   EYE SURGERY     HEMORRHOID SURGERY     INTRAVASCULAR ULTRASOUND/IVUS N/A 06/11/2021   Procedure: Intravascular Ultrasound/IVUS;  Surgeon: Martinique, Peter M, MD;  Location: [place];  Service: Cardiovascular;  Laterality: N/A;   LEFT HEART CATH AND CORONARY ANGIOGRAPHY N/A 06/09/2021   Procedure: LEFT HEART CATH AND CORONARY ANGIOGRAPHY;  Surgeon: Martinique, Peter M, MD;  Location: [place] [facility];  Service: Cardiovascular;  Laterality: N/A;   skin cancer removed right ear     TRANSFORAMINAL LUMBAR INTERBODY FUSION (TLIF) WITH PEDICLE SCREW FIXATION 2 LEVEL N/A 07/06/2018   Procedure: TRANSFORAMINAL LUMBAR INTERBODY FUSION (TLIF) L4-S1;  Surgeon: Melina Schools, MD;  Location: [facility];  Service: Orthopedics;  Laterality: N/A;     Current Outpatient Medications  Medication Sig Dispense Refill   aspirin 81 MG EC tablet Take 1 tablet (81 mg total) by mouth daily. Swallow whole. 90 tablet 3   atorvastatin (LIPITOR) 80 MG tablet Take 1 tablet (80 mg total) by mouth daily. 90 tablet 3   carvedilol (COREG) 12.5 MG tablet Take 1 tablet (12.5 mg total) by mouth 2 (two) times daily with a meal. 180 tablet 3   cloNIDine (CATAPRES)  0.3 MG tablet Take 1 tablet (0.3 mg total) by mouth 2 (two) times daily. 180 tablet 3   empagliflozin (JARDIANCE) 10 MG TABS tablet Take 1 tablet (10 mg total) by mouth daily before breakfast. 30 tablet 6   gabapentin (NEURONTIN) 100 MG capsule Take 1 capsule (100 mg total) by mouth 2 (two) times daily. 180 capsule 2   Insulin Pen Needle (PEN NEEDLES) 31G X 5 MM MISC Use daily with insulin Dx E11.9 100 each 3   nitroGLYCERIN (NITROSTAT) 0.4 MG SL tablet Place 1 tablet (0.4 mg total) under the tongue every 5 (five) minutes as needed for chest pain. 25 tablet 3   tamsulosin (FLOMAX) 0.4 MG CAPS capsule Take 1 capsule (0.4 mg total) by mouth  daily after supper. 90 capsule 3   ticagrelor (BRILINTA) 90 MG TABS tablet Take 1 tablet (90 mg total) by mouth 2 (two) times daily. 180 tablet 3   TRESIBA FLEXTOUCH 100 UNIT/ML FlexTouch Pen INJECT UP TO 36 UNITS EVERY MORNING. (MAY INCREASE BY 1 UNIT EVERY OTHER DAY TIL FBS BELOW 150) 15 mL 1   amLODipine (NORVASC) 5 MG tablet Take 1.5 tablets (7.5 mg total) by mouth daily. 135 tablet 3   No current facility-administered medications for this visit.    Allergies:   Patient has no known allergies.    ROS:  Please see the history of present illness.   Otherwise, review of systems are positive for none.   All other systems are reviewed and negative.    PHYSICAL EXAM: VS:  BP 140/60   Pulse 62   Ht 6' (1.829 m)   Wt 202 lb (91.6 kg)   SpO2 97%   BMI 27.40 kg/m  , BMI Body mass index is 27.4 kg/m. GENERAL:  Well appearing NECK:  No jugular venous distention, waveform within normal limits, carotid upstroke brisk and symmetric, no bruits, no thyromegaly LUNGS:  Clear to auscultation bilaterally CHEST:  Unremarkable HEART:  PMI not displaced or sustained,S1 and S2 within normal limits, no S3, no S4, no clicks, no rubs, no murmurs ABD:  Flat, positive bowel sounds normal in frequency in pitch, no bruits, no rebound, no guarding, no midline pulsatile mass, no hepatomegaly, no splenomegaly EXT:  2 plus pulses throughout, no edema, no cyanosis no clubbing    Cardiac cath:   Diagnostic Dominance: Right         Staged intervention 06/11/21:     1st Mrg lesion is 95% stenosed.   A drug-eluting stent was successfully placed using a STENT ONYX FRONTIER 2.75X26.   Post intervention, there is a 0% residual stenosis.   Ost Cx to Prox Cx lesion is 65% stenosed.   A drug-eluting stent was successfully placed using a STENT ONYX FRONTIER 3.0X22.   Post intervention, there is a 0% residual stenosis.   Successful Bifurcation stenting of the proximal to mid LCx and first OM using IVUS guidance  and Cullotte technique with DES x 2      Intervention        EKG:  EKG is not ordered today. The ekg ordered today demonstrates sinus rhythm, rate 63, RSR prime V1 and V2, first-degree AV block, left axis deviation, 03/30/2021   Recent Labs: 06/07/2021: ALT 15 06/08/2021: B Natriuretic Peptide 217.1 06/12/2021: BUN 15; Creatinine, Ser 1.91; Hemoglobin 10.1; Platelets 178; Potassium 4.1; Sodium 137    Lipid Panel    Component Value Date/Time   CHOL 160 06/08/2021 0104   CHOL 138 10/01/2019 1133  TRIG 120 06/08/2021 0104   HDL 34 (L) 06/08/2021 0104   HDL 37 (L) 10/01/2019 1133   CHOLHDL 4.7 06/08/2021 0104   VLDL 24 06/08/2021 0104   LDLCALC 102 (H) 06/08/2021 0104   LDLCALC 71 10/01/2019 1133      Wt Readings from Last 3 Encounters:  07/02/21 202 lb (91.6 kg)  06/25/21 201 lb 9.6 oz (91.4 kg)  06/12/21 205 lb 7.5 oz (93.2 kg)      Other studies Reviewed: Additional studies/ records that were reviewed today include: Labs. Review of the above records demonstrates:  Please see elsewhere in the note.     ASSESSMENT AND PLAN:  CAD:  The patient has no new sypmtoms.  No further cardiovascular testing is indicated.  We will continue with aggressive risk reduction and meds as listed.  HTN: BP is not at target.  I am going to increase his amlodipine to 7-1/2 mg daily.   HLD: LDL was 102 in the hospital.  He has been started on Lipitor.  I will check a lipid profile in early October.    DM type 2: A1C 6.5.  He is concerned because his fingerstick blood sugars are going up.  I will send a note to [name], DO to ask for sooner follow-up or med changes.    CKD stage 3b: Cr was 1.91 at discharge.  It peaked at 2.23 in the hospital.  He has follow-up. With nephrology.     Current medicines are reviewed at length with the patient today.  The patient does not have concerns regarding medicines.  The following changes have been made:  As above Labs/ tests ordered  today include: None  Orders Placed This Encounter  Procedures   Lipid panel      Disposition:   FU with me in 6 months.    Signed, Minus Breeding, MD  07/02/2021 9:08 AM    [facility]

## 2021-07-02 ENCOUNTER — Encounter: Payer: Self-pay | Admitting: Cardiology

## 2021-07-02 ENCOUNTER — Other Ambulatory Visit: Payer: Self-pay

## 2021-07-02 ENCOUNTER — Ambulatory Visit: Payer: PPO | Admitting: Cardiology

## 2021-07-02 VITALS — BP 140/60 | HR 62 | Ht 72.0 in | Wt 202.0 lb

## 2021-07-02 DIAGNOSIS — E118 Type 2 diabetes mellitus with unspecified complications: Secondary | ICD-10-CM

## 2021-07-02 DIAGNOSIS — I10 Essential (primary) hypertension: Secondary | ICD-10-CM

## 2021-07-02 DIAGNOSIS — E785 Hyperlipidemia, unspecified: Secondary | ICD-10-CM | POA: Diagnosis not present

## 2021-07-02 DIAGNOSIS — I214 Non-ST elevation (NSTEMI) myocardial infarction: Secondary | ICD-10-CM

## 2021-07-02 DIAGNOSIS — N1832 Chronic kidney disease, stage 3b: Secondary | ICD-10-CM

## 2021-07-02 MED ORDER — AMLODIPINE BESYLATE 5 MG PO TABS: 7.5000 mg | ORAL_TABLET | Freq: Every day | ORAL | 3 refills | Status: DC

## 2021-07-02 NOTE — Patient Instructions (Addendum)
Medication Instructions:   INCREASE AMLODIPINE TO 7.5 MG ONCE DAILY= 1 AND 1/2 OF THE 5 MG TABLET ONCE DAILY  *If you need a refill on your cardiac medications before your next appointment, please call your pharmacy*   Lab Work:  Your physician recommends that you return for lab work in: October [place]  If you have labs (blood work) drawn today and your tests are completely normal, you will receive your results only by: [employer] (if you have MyChart) OR A paper copy in the mail If you have any lab test that is abnormal or we need to change your treatment, we will call you to review the results.  Follow-Up: At [facility], you and your health needs are our priority.  As part of our continuing mission to provide you with exceptional heart care, we have created designated Provider Care Teams.  These Care Teams include your primary Cardiologist (physician) and Advanced Practice Providers (APPs -  Physician Assistants and Nurse Practitioners) who all work together to provide you with the care you need, when you need it.  We recommend signing up for the patient portal called "MyChart".  Sign up information is provided on this After Visit Summary.  MyChart is used to connect with patients for Virtual Visits (Telemedicine).  Patients are able to view lab/test results, encounter notes, upcoming appointments, etc.  Non-urgent messages can be sent to your provider as well.   To learn more about what you can do with MyChart, go to [email or web address].    Your next appointment:   6 month(s) IN [place]  The format for your next appointment:   In Person  Provider:   Minus Breeding, MD

## 2021-07-03 ENCOUNTER — Encounter: Payer: Self-pay | Admitting: Family Medicine

## 2021-07-03 ENCOUNTER — Ambulatory Visit (INDEPENDENT_AMBULATORY_CARE_PROVIDER_SITE_OTHER): Payer: PPO | Admitting: Family Medicine

## 2021-07-03 VITALS — BP 153/72 | HR 63 | Temp 97.4°F | Ht 72.0 in | Wt 201.2 lb

## 2021-07-03 DIAGNOSIS — E1122 Type 2 diabetes mellitus with diabetic chronic kidney disease: Secondary | ICD-10-CM

## 2021-07-03 DIAGNOSIS — N184 Chronic kidney disease, stage 4 (severe): Secondary | ICD-10-CM | POA: Diagnosis not present

## 2021-07-03 DIAGNOSIS — R739 Hyperglycemia, unspecified: Secondary | ICD-10-CM | POA: Diagnosis not present

## 2021-07-03 MED ORDER — TRESIBA FLEXTOUCH 100 UNIT/ML ~~LOC~~ SOPN: PEN_INJECTOR | SUBCUTANEOUS | 1 refills | Status: DC

## 2021-07-03 NOTE — Patient Instructions (Signed)
Increase your [name] to 38 units.  If your FASTING blood sugar is above 150 for 2 consecutive days, increase by 1 unit of your long acting insulin.  Example: Day 1: Blood sugar was 159 Day 2: Blood sugar was 205  Increase Tresiba to 39 units Day 3: Blood sugar is 202 Day 4: Blood sugars 168  Increase Tresiba to 40 units Day 5: Blood sugar is 105 Day 6: Blood sugars 145  Stick with 40 units.  Do not increase.

## 2021-07-03 NOTE — Progress Notes (Signed)
Subjective: CC: Hyperglycemia PCP: [name], DO HUD:JSHFWYO [name] is a 65 y.o. male presenting to clinic today for:  1.  Hyperglycemia with known type 2 diabetes Patient is compliant with his medications.  He is injecting 36 units of Tresiba daily but blood sugars remain along the upper 100s.  He has had nothing below 194.  His height has been 257.  He is compliant with Jardiance and new dose of Norvasc of 7.5 mg daily.  His cardiologist recommended that he follow-up for management of his sugar. Reports some dizziness but no chest pain or shortness of breath.  Of note patient had a recent PCI.  ROS: Per HPI  No Known Allergies Past Medical History:  Diagnosis Date   Chronic kidney disease    Complication of anesthesia    Hard to wake   Degenerative joint disease (DJD) of lumbar spine    Diabetes mellitus without complication ([place])    Type II   Essential hypertension 02/23/2017   Lumbar stenosis    NSTEMI (non-ST elevated myocardial infarction) ([place]) 06/07/2021    Current Outpatient Medications:    amLODipine (NORVASC) 5 MG tablet, Take 1.5 tablets (7.5 mg total) by mouth daily., Disp: 135 tablet, Rfl: 3   aspirin 81 MG EC tablet, Take 1 tablet (81 mg total) by mouth daily. Swallow whole., Disp: 90 tablet, Rfl: 3   atorvastatin (LIPITOR) 80 MG tablet, Take 1 tablet (80 mg total) by mouth daily., Disp: 90 tablet, Rfl: 3   carvedilol (COREG) 12.5 MG tablet, Take 1 tablet (12.5 mg total) by mouth 2 (two) times daily with a meal., Disp: 180 tablet, Rfl: 3   cloNIDine (CATAPRES) 0.3 MG tablet, Take 1 tablet (0.3 mg total) by mouth 2 (two) times daily., Disp: 180 tablet, Rfl: 3   empagliflozin (JARDIANCE) 10 MG TABS tablet, Take 1 tablet (10 mg total) by mouth daily before breakfast., Disp: 30 tablet, Rfl: 6   gabapentin (NEURONTIN) 100 MG capsule, Take 1 capsule (100 mg total) by mouth 2 (two) times daily., Disp: 180 capsule, Rfl: 2   Insulin Pen Needle (PEN NEEDLES)  31G X 5 MM MISC, Use daily with insulin Dx E11.9, Disp: 100 each, Rfl: 3   nitroGLYCERIN (NITROSTAT) 0.4 MG SL tablet, Place 1 tablet (0.4 mg total) under the tongue every 5 (five) minutes as needed for chest pain., Disp: 25 tablet, Rfl: 3   tamsulosin (FLOMAX) 0.4 MG CAPS capsule, Take 1 capsule (0.4 mg total) by mouth daily after supper., Disp: 90 capsule, Rfl: 3   ticagrelor (BRILINTA) 90 MG TABS tablet, Take 1 tablet (90 mg total) by mouth 2 (two) times daily., Disp: 180 tablet, Rfl: 3   TRESIBA FLEXTOUCH 100 UNIT/ML FlexTouch Pen, INJECT UP TO 36 UNITS EVERY MORNING. (MAY INCREASE BY 1 UNIT EVERY OTHER DAY TIL FBS BELOW 150), Disp: 15 mL, Rfl: 1 Social History   Socioeconomic History   Marital status: Married    Spouse name: Not on file   Number of children: 3   Years of education: Not on file   Highest education level: Not on file  Occupational History   Not on file  Tobacco Use   Smoking status: Never   Smokeless tobacco: Never  Vaping Use   Vaping Use: Never used  Substance and Sexual Activity   Alcohol use: Yes    Alcohol/week: 6.0 standard drinks    Types: 6 Cans of beer per week   Drug use: No   Sexual activity: Yes  Other Topics Concern   Not on file  Social History Narrative   Lives with his wife - Has a son with cirrhosis who he stays with a lot. Has another son and one daughter    Social Determinants of Radio broadcast assistant Strain: Low Risk    Difficulty of Paying Living Expenses: Not hard at all  Food Insecurity: No Food Insecurity   Worried About Charity fundraiser in the Last Year: Never true   Arboriculturist in the Last Year: Never true  Transportation Needs: No Transportation Needs   Lack of Transportation (Medical): No   Lack of Transportation (Non-Medical): No  Physical Activity: Sufficiently Active   Days of Exercise per Week: 7 days   Minutes of Exercise per Session: 60 min  Stress: No Stress Concern Present   Feeling of Stress : Not at  all  Social Connections: Moderately Isolated   Frequency of Communication with Friends and Family: More than three times a week   Frequency of Social Gatherings with Friends and Family: More than three times a week   Attends Religious Services: Never   Marine scientist or Organizations: No   Attends Music therapist: Never   Marital Status: Married  Human resources officer Violence: Not At Risk   Fear of Current or Ex-Partner: No   Emotionally Abused: No   Physically Abused: No   Sexually Abused: No   Family History  Problem Relation Age of Onset   Heart attack Father 85       Died suddenly   Hypertension Father    Vision loss Sister        one eye   Cancer Brother        skin   Diabetes Brother     Objective: Office vital signs reviewed. BP (!) 153/72   Pulse 63   Temp (!) 97.4 F (36.3 C)   Ht 6' (1.829 m)   Wt 201 lb 3.2 oz (91.3 kg)   SpO2 98%   BMI 27.29 kg/m   Physical Examination:  General: Awake, alert, well nourished, No acute distress HEENT: Normal; sclera white Cardio: regular rate and rhythm, S1S2 heard, no murmurs appreciated Pulm: clear to auscultation bilaterally, no wheezes, rhonchi or rales; normal work of breathing on room air  Assessment/ Plan: 65 y.o. male   Hyperglycemia  Type 2 diabetes mellitus with stage 4 chronic kidney disease, without long-term current use of insulin ([name]) - Plan: insulin degludec (TRESIBA FLEXTOUCH) 100 UNIT/ML FlexTouch Pen  Sugars not controlled.  We discussed advancing his insulin to 38 units and then he can increase by 1 unit every 2 days for fasting blood sugar above 150.  This was reiterated both verbally and on on his AVS.  A sample of [name] was provided and his prescription has been sent with updated directions.  Would like him to see Almyra Free in the next 2 weeks for recheck  No orders of the defined types were placed in this encounter.  No orders of the defined types were placed in this  encounter.    [name], DO [place] [phone]

## 2021-07-06 NOTE — Chronic Care Management (AMB) (Signed)
Chronic Care Management   CCM RN Visit Note   07/01/2021 Name: [name] MRN: [id number] DOB: 1956-09-23  Subjective: [name] is a 65 y.o. year old male who is a primary care patient of Clifford Norlander, DO. The care management team was consulted for assistance with disease management and care coordination needs.    Engaged with patient by telephone for follow up visit in response to provider referral for case management and/or care coordination services.   Consent to Services:  The patient was given information about Chronic Care Management services, agreed to services, and gave verbal consent prior to initiation of services.  Please see initial visit note for detailed documentation.   Patient agreed to services and verbal consent obtained.   Assessment: Review of patient past medical history, allergies, medications, health status, including review of consultants reports, laboratory and other test data, was performed as part of comprehensive evaluation and provision of chronic care management services.   SDOH (Social Determinants of Health) assessments and interventions performed:    CCM Care Plan  No Known Allergies  Outpatient Encounter Medications as of 07/01/2021  Medication Sig   aspirin 81 MG EC tablet Take 1 tablet (81 mg total) by mouth daily. Swallow whole.   atorvastatin (LIPITOR) 80 MG tablet Take 1 tablet (80 mg total) by mouth daily.   carvedilol (COREG) 12.5 MG tablet Take 1 tablet (12.5 mg total) by mouth 2 (two) times daily with a meal.   cloNIDine (CATAPRES) 0.3 MG tablet Take 1 tablet (0.3 mg total) by mouth 2 (two) times daily.   empagliflozin (JARDIANCE) 10 MG TABS tablet Take 1 tablet (10 mg total) by mouth daily before breakfast.   gabapentin (NEURONTIN) 100 MG capsule Take 1 capsule (100 mg total) by mouth 2 (two) times daily.   Insulin Pen Needle (PEN NEEDLES) 31G X 5 MM MISC Use daily with insulin Dx E11.9   nitroGLYCERIN  (NITROSTAT) 0.4 MG SL tablet Place 1 tablet (0.4 mg total) under the tongue every 5 (five) minutes as needed for chest pain.   tamsulosin (FLOMAX) 0.4 MG CAPS capsule Take 1 capsule (0.4 mg total) by mouth daily after supper.   ticagrelor (BRILINTA) 90 MG TABS tablet Take 1 tablet (90 mg total) by mouth 2 (two) times daily.   [DISCONTINUED] amLODipine (NORVASC) 5 MG tablet Take 1 tablet (5 mg total) by mouth daily.   [DISCONTINUED] TRESIBA FLEXTOUCH 100 UNIT/ML FlexTouch Pen INJECT UP TO 36 UNITS EVERY MORNING. (MAY INCREASE BY 1 UNIT EVERY OTHER DAY TIL FBS BELOW 150)   No facility-administered encounter medications on file as of 07/01/2021.    Patient Active Problem List   Diagnosis Date Noted   CAD, multiple vessel 06/25/2021   Elevated troponin    DM (diabetes mellitus) type II, controlled, with peripheral vascular disorder (HCC)    Chronic kidney disease (CKD) stage G3b/A2, moderately decreased glomerular filtration rate (GFR) between 30-44 mL/min/1.73 square meter and albuminuria creatinine ratio between 30-299 mg/g (HCC)    Unstable angina ([place]) 06/07/2021   NSTEMI (non-ST elevated myocardial infarction) ([place]) 06/07/2021   Chest pain 04/07/2021   Personal history of diabetic foot ulcer 06/18/2020   History of amputation of great toe ([name]) 06/18/2020   Osteomyelitis of great toe of right foot ([name])    Diabetic ulcer of toe of right foot associated with diabetes mellitus due to underlying condition, with necrosis of bone ([name])    Osteomyelitis ([name]) 05/15/2020   AKI (acute kidney injury) ([name])  05/15/2020   OSA (obstructive sleep apnea) 05/15/2020   Acute kidney failure, unspecified ([name]) 05/15/2020   Cellulitis of right foot 05/07/2020   Type 2 diabetes mellitus ([name]) 05/07/2020   Cellulitis of right lower extremity    Diabetic infection of right foot ([name])    Cellulitis of great toe, right    Diabetic ulcer of toe of right foot associated with type 2 diabetes mellitus, with fat  layer exposed ([name]) 04/15/2020   OSA on CPAP 09/01/2018   S/P lumbar fusion 07/06/2018   DDD (degenerative disc disease), lumbar 02/28/2018   Hypertension associated with diabetes ([place]) 02/23/2017   Benign prostatic hyperplasia with incomplete bladder emptying 02/23/2017   Vitamin D deficiency 02/23/2017   Hyperlipidemia associated with type 2 diabetes mellitus ([name]) 02/23/2017   Type 2 diabetes mellitus with stage 2 chronic kidney disease, without long-term current use of insulin ([name]) 02/23/2017   Hyperlipidemia, unspecified 02/23/2017   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    Conditions to be addressed/monitored:CAD, HTN, and DMII  Care Plan : [place]  Updates made by Ilean China, RN since 07/06/2021 12:00 AM     Problem: Chronic Disease Management Needs   Priority: Medium     Long-Range Goal: Patient will Work with [facility] Regarding Chronic Care Management and Care Coordination Associated with HTN, DM, and CAD   This Visit's Progress: On track  Priority: Medium  Note:   Current Barriers:  Knowledge Deficits related to plan of care for management of CAD  Chronic Disease Management support and education needs related to CAD, HTN, and DMII  RNCM Clinical Goal(s):  Patient will verbalize understanding of plan for management of CAD take all medications exactly as prescribed and will call provider for medication related questions continue to work with RN Care Manager to address care management and care coordination needs related to CAD, HTN, and DMII  through collaboration with RN Care manager, provider, and care team.   Interventions: 1:1 collaboration with primary care provider regarding development and update of comprehensive plan of care as evidenced by provider attestation and co-signature Inter-disciplinary care team collaboration (see longitudinal plan of care) Evaluation of current treatment plan related to  self management and patient's adherence to  plan as established by provider Medications reviewed and importance of compliance discussed Provided with RNCM contact number and encouraged to reach out as needed   CAD  (Status: New goal.) Assessed understanding of CAD diagnosis Medications reviewed including medications utilized in CAD treatment plan Provided education on importance of blood pressure control in management of CAD; Provided education on Importance of limiting foods high in cholesterol; Counseled on importance of regular laboratory monitoring as prescribed; Counseled on the importance of exercise goals with target of 150 minutes per week Reviewed Importance of taking all medications as prescribed Reviewed Importance of attending all scheduled provider appointments Advised to report any changes in symptoms or exercise tolerance Screening for signs and symptoms of depression related to chronic disease state;  Discussed use of sublingual nitroglycerin for chest pain and frequency of need Verbal education provided on how to store and take nitro Verbal education on mechanism of action of Nitroglycerin given Pathophysiology of heart disease and heart attack discussed. Questions answered Therapeutic listening utilized regarding recent heart attack, ED visit, and hospital stay Personal symptoms of recent heart attack reviewed. Discussed s/s to monitor for. Verbal education provided on when to see appropriate medical attention for chest pain, shortness of breath, and any other s/s of a  heart attack Discussed family/social support Discussed current physical activity level and ability to perform ADLs Reviewed upcoming appointments   Diabetes:  (Status: Goal on track: YES.) Lab Results  Component Value Date   HGBA1C 6.5 (H) 06/08/2021  Assessed patient's understanding of A1c goal: <7% hart reviewed including relevant office notes and lab results Reinforced need to check and record blood pressure daily Advised patient to bring  blood pressure log to PCP and nephrology visits Advised patient to call PCP or nephrologist with any readings outside of recommended range Reviewed and discussed upcoming appointment with cardiologist Encouraged patient to seek medical attention for any new or worsening symptoms Encouraged patient to Call [facility] as needed   Hypertension: (Status: Goal on track: YES.) Last practice recorded BP readings:  BP Readings from Last 3 Encounters:  07/03/21 (!) 153/72  07/02/21 140/60  06/25/21 (!) 151/71  Most recent eGFR/CrCl: No results found for: EGFR  No components found for: CRCL Discussed home blood sugar readings Reviewed and discussed medications Discussed activity level and ability to perform ADLs Encouraged patient to continue daily foot checks and report any sores or calluses  Reinforced need to schedule a diabetic eye exam with eye care provider Reviewed upcoming appointments Encouraged patient to reach out to PCP with any blood sugar readings outside of recommended range Encouraged patient to reach out to [facility] as needed  Patient Goals/Self-Care Activities: Patient will self administer medications as prescribed Patient will attend all scheduled provider appointments Patient will call pharmacy for medication refills Patient will continue to perform ADL's independently Patient will continue to perform IADL's independently Patient will call provider office for new concerns or questions       Plan:Telephone follow up appointment with care management team member scheduled for:  07/22/21 with RNCM and The patient has been provided with contact information for the care management team and has been advised to call with any health related questions or concerns.   Chong Sicilian, BSN, RN-BC Embedded Chronic Care Manager [facility] [place] [facility] Direct Dial: [phone]

## 2021-07-06 NOTE — Patient Instructions (Signed)
Visit Information  PATIENT GOALS:  Goals Addressed             This Visit's Progress    Manage CAD       Timeframe:  Long-Range Goal Priority:  High Start Date:   07/01/21                          Expected End Date:                       Follow-up: 07/22/21  Keep all medical appointments Take all mediations as prescribed Take nitroglycerin as needed for chest pain Nitro can cause a headache, lowered blood pressure, and lightheadedness Nitro can be taken 3 times, 5 minutes a part for chest pain that doesn't go away If chest pain is not gone after the 2nd Nitro, call 911 and take the 3rd dose Eat plenty of whole foods: lean proteins, vegetables, low glycemic fruits. Avoid sugars, simple carbs, limit sodium/salt Increase physical activity level with a goal of at least 150 minutes a week Seek appropriate medical attention for any new or worsening symptoms     Monitor and Manage My Blood Sugar-Diabetes Type 2   On track    Timeframe:  Long-Range Goal Priority:  Medium Start Date:                             Expected End Date:                       Follow Up Date 07/22/21   check blood sugar at prescribed times check blood sugar if I feel it is too high or too low enter blood sugar readings and medication or insulin into daily log take the blood sugar log to all doctor visits take the blood sugar meter to all doctor visits  Call PCP with any readings outside of recommended range Eat a healthy diet. Limit sugar and simple carbohydrates.  Fill plate halfway with vegetables Eat lean meats Schedule an appt with eye doctor for diabetic eye exam Increase water intake Keep all medical appointments Remain physically active daily with a goal of at least 150 minutes of moderate physical activity Call [place] as needed [phone]   Why is this important?   Checking your blood sugar at home helps to keep it from getting very high or very low.  Writing the results in a diary or  log helps the doctor know how to care for you.  Your blood sugar log should have the time, date and the results.  Also, write down the amount of insulin or other medicine that you take.  Other information, like what you ate, exercise done and how you were feeling, will also be helpful.     Notes:      Track and Manage My Blood Pressure-Hypertension   On track    Timeframe:  Long-Range Goal Priority:  Medium Start Date:                             Expected End Date:                       Follow Up Date 89/05/2021   check blood pressure daily write blood pressure results in a log or diary  Take blood pressure  log to PCP appointments Call PCP or nephrologist with any readings outside of recommended range Increase water intake Keep all medical appointments Call PCP for appt or seek medical attention for any new or worsening symptoms Take all medications as directed Call RN Care Manager as needed [phone]   Why is this important?   You won't feel high blood pressure, but it can still hurt your blood vessels.  High blood pressure can cause heart or kidney problems. It can also cause a stroke.  Making lifestyle changes like losing a little weight or eating less salt will help.  Checking your blood pressure at home and at different times of the day can help to control blood pressure.  If the doctor prescribes medicine remember to take it the way the doctor ordered.  Call the office if you cannot afford the medicine or if there are questions about it.     Notes:         Patient verbalizes understanding of instructions provided today and agrees to view in National.   Plan:Telephone follow up appointment with care management team member scheduled for:  07/22/21 with RNCM and The patient has been provided with contact information for the care management team and has been advised to call with any health related questions or concerns.   Chong Sicilian, BSN, RN-BC Embedded Chronic Care  Manager [facility] [place] [facility] Direct Dial: [phone]

## 2021-07-13 ENCOUNTER — Emergency Department (HOSPITAL_COMMUNITY)
Admission: EM | Admit: 2021-07-13 | Discharge: 2021-07-13 | Disposition: A | Discharge status: Home / Self Care | Payer: PPO | Attending: Emergency Medicine | Admitting: Emergency Medicine

## 2021-07-13 ENCOUNTER — Other Ambulatory Visit: Payer: Self-pay

## 2021-07-13 ENCOUNTER — Emergency Department (HOSPITAL_COMMUNITY): Payer: PPO

## 2021-07-13 DIAGNOSIS — R079 Chest pain, unspecified: Secondary | ICD-10-CM

## 2021-07-13 DIAGNOSIS — M7989 Other specified soft tissue disorders: Secondary | ICD-10-CM | POA: Insufficient documentation

## 2021-07-13 DIAGNOSIS — Z794 Long term (current) use of insulin: Secondary | ICD-10-CM | POA: Diagnosis not present

## 2021-07-13 DIAGNOSIS — E1122 Type 2 diabetes mellitus with diabetic chronic kidney disease: Secondary | ICD-10-CM | POA: Diagnosis not present

## 2021-07-13 DIAGNOSIS — N1832 Chronic kidney disease, stage 3b: Secondary | ICD-10-CM | POA: Insufficient documentation

## 2021-07-13 DIAGNOSIS — Z7982 Long term (current) use of aspirin: Secondary | ICD-10-CM | POA: Insufficient documentation

## 2021-07-13 DIAGNOSIS — I2510 Atherosclerotic heart disease of native coronary artery without angina pectoris: Secondary | ICD-10-CM | POA: Diagnosis not present

## 2021-07-13 DIAGNOSIS — Z20822 Contact with and (suspected) exposure to covid-19: Secondary | ICD-10-CM | POA: Insufficient documentation

## 2021-07-13 DIAGNOSIS — Z79899 Other long term (current) drug therapy: Secondary | ICD-10-CM | POA: Insufficient documentation

## 2021-07-13 DIAGNOSIS — R0789 Other chest pain: Secondary | ICD-10-CM

## 2021-07-13 DIAGNOSIS — I129 Hypertensive chronic kidney disease with stage 1 through stage 4 chronic kidney disease, or unspecified chronic kidney disease: Secondary | ICD-10-CM | POA: Insufficient documentation

## 2021-07-13 LAB — CBC
HCT: 28.9 % — ABNORMAL LOW (ref 39.0–52.0)
Hemoglobin: 9.8 g/dL — ABNORMAL LOW (ref 13.0–17.0)
MCH: 29.4 pg (ref 26.0–34.0)
MCHC: 33.9 g/dL (ref 30.0–36.0)
MCV: 86.8 fL (ref 80.0–100.0)
Platelets: 178 10*3/uL (ref 150–400)
RBC: 3.33 MIL/uL — ABNORMAL LOW (ref 4.22–5.81)
RDW: 14.6 % (ref 11.5–15.5)
WBC: 10.9 10*3/uL — ABNORMAL HIGH (ref 4.0–10.5)
nRBC: 0 % (ref 0.0–0.2)

## 2021-07-13 LAB — SARS CORONAVIRUS 2 (TAT 6-24 HRS): SARS Coronavirus 2: NEGATIVE

## 2021-07-13 LAB — BASIC METABOLIC PANEL
Anion gap: 7 (ref 5–15)
BUN: 23 mg/dL (ref 8–23)
CO2: 24 mmol/L (ref 22–32)
Calcium: 8.6 mg/dL — ABNORMAL LOW (ref 8.9–10.3)
Chloride: 107 mmol/L (ref 98–111)
Creatinine, Ser: 1.82 mg/dL — ABNORMAL HIGH (ref 0.61–1.24)
GFR, Estimated: 40 mL/min — ABNORMAL LOW (ref >60)
Glucose, Bld: 106 mg/dL — ABNORMAL HIGH (ref 70–99)
Potassium: 3.2 mmol/L — ABNORMAL LOW (ref 3.5–5.1)
Sodium: 138 mmol/L (ref 135–145)

## 2021-07-13 LAB — TROPONIN I (HIGH SENSITIVITY)
Troponin I (High Sensitivity): 16 ng/L (ref <18)
Troponin I (High Sensitivity): 16 ng/L (ref <18)

## 2021-07-13 MED ORDER — ASPIRIN 81 MG PO CHEW
324.0000 mg | CHEWABLE_TABLET | Freq: Once | ORAL | Status: AC
  Administered 2021-07-13: 324 mg via ORAL
  Filled 2021-07-13: qty 4

## 2021-07-13 MED ORDER — POTASSIUM CHLORIDE CRYS ER 20 MEQ PO TBCR
40.0000 meq | EXTENDED_RELEASE_TABLET | Freq: Once | ORAL | Status: AC
  Administered 2021-07-13: 40 meq via ORAL
  Filled 2021-07-13: qty 2

## 2021-07-13 NOTE — Progress Notes (Signed)
Patient discussed with ER staff. Recent PCI. Presents with chest pain worst with movement of head and neck, reproducible with movement in ER. Trops neg x 2 without any delta, EKG without acute ischemic changes. Do not see indication for admission at this time, can increase his norvasc to 10mg  daily for additional antianginal effects, we will arrange outpatient f/u.   Carlyle Dolly MD

## 2021-07-13 NOTE — ED Triage Notes (Signed)
Patient complains of chest pain with any movement of head or neck, states that the discomfort has worsened the past 2 days. Patient alert and oriented

## 2021-07-13 NOTE — Discharge Instructions (Addendum)
Increase your norstat to 10 mg.  You should be receiving a call from your cardiovascular group to schedule an appointment later this week for follow-up.  If you do not hear from them first thing in the morning tomorrow please call and set up a follow-up appointment within the next 2 days for reevaluation.  If things change or worsen please return back to the ED.

## 2021-07-13 NOTE — ED Provider Notes (Signed)
[facility] EMERGENCY DEPARTMENT Provider Note   CSN: [id number] Arrival date & time: 07/13/21  1051     History No chief complaint on file.   [name] is a 65 y.o. male.  HPI  Patient presents with left-sided chest pain that radiates to the left arm x1-1/2 days.  The pain is intermittent, worse when he moves his neck to lean forward.  The pain was alleviated with nitroglycerin.  The pain is a sharp pain that can sometimes be dull and achy.  It was associated with nausea and vomiting yesterday, but not today.  No associated shortness of breath.  Patient states the pain feels similar to 1 month ago when he was admitted for acute MI.  He was seen in the Cath Lab and required 3 stents.  Last appoint with his cardiologist was 1 week ago.  Past Medical History:  Diagnosis Date   Chronic kidney disease    Complication of anesthesia    Hard to wake   Degenerative joint disease (DJD) of lumbar spine    Diabetes mellitus without complication ([place])    Type II   Essential hypertension 02/23/2017   Lumbar stenosis    NSTEMI (non-ST elevated myocardial infarction) ([place]) 06/07/2021    Patient Active Problem List   Diagnosis Date Noted   CAD, multiple vessel 06/25/2021   Elevated troponin    DM (diabetes mellitus) type II, controlled, with peripheral vascular disorder (HCC)    Chronic kidney disease (CKD) stage G3b/A2, moderately decreased glomerular filtration rate (GFR) between 30-44 mL/min/1.73 square meter and albuminuria creatinine ratio between 30-299 mg/g (HCC)    Unstable angina ([place]) 06/07/2021   NSTEMI (non-ST elevated myocardial infarction) ([place]) 06/07/2021   Chest pain 04/07/2021   Personal history of diabetic foot ulcer 06/18/2020   History of amputation of great toe ([place]) 06/18/2020   Osteomyelitis of great toe of right foot ([place])    Diabetic ulcer of toe of right foot associated with diabetes mellitus due to underlying condition, with necrosis  of bone ([place])    Osteomyelitis ([place]) 05/15/2020   AKI (acute kidney injury) ([place]) 05/15/2020   OSA (obstructive sleep apnea) 05/15/2020   Acute kidney failure, unspecified ([place]) 05/15/2020   Cellulitis of right foot 05/07/2020   Type 2 diabetes mellitus ([place]) 05/07/2020   Cellulitis of right lower extremity    Diabetic infection of right foot ([place])    Cellulitis of great toe, right    Diabetic ulcer of toe of right foot associated with type 2 diabetes mellitus, with fat layer exposed ([place]) 04/15/2020   OSA on CPAP 09/01/2018   S/P lumbar fusion 07/06/2018   DDD (degenerative disc disease), lumbar 02/28/2018   Hypertension associated with diabetes ([employer]) 02/23/2017   Benign prostatic hyperplasia with incomplete bladder emptying 02/23/2017   Vitamin D deficiency 02/23/2017   Hyperlipidemia associated with type 2 diabetes mellitus ([place]) 02/23/2017   Type 2 diabetes mellitus with stage 2 chronic kidney disease, without long-term current use of insulin ([place]) 02/23/2017   Hyperlipidemia, unspecified 02/23/2017   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    Past Surgical History:  Procedure Laterality Date   AMPUTATION TOE Right 05/17/2020   Procedure: AMPUTATION TOE partial right great toe;  Surgeon: Evelina Bucy, DPM;  Location: [facility];  Service: Podiatry;  Laterality: Right;   BELPHAROPTOSIS REPAIR     CARDIAC CATHETERIZATION     CORONARY STENT INTERVENTION N/A 06/11/2021   Procedure: CORONARY STENT INTERVENTION;  Surgeon: Martinique, Peter  M, MD;  Location: [facility];  Service: Cardiovascular;  Laterality: N/A;   EYE SURGERY     HEMORRHOID SURGERY     INTRAVASCULAR ULTRASOUND/IVUS N/A 06/11/2021   Procedure: Intravascular Ultrasound/IVUS;  Surgeon: Martinique, Peter M, MD;  Location: [facility];  Service: Cardiovascular;  Laterality: N/A;   LEFT HEART CATH AND CORONARY ANGIOGRAPHY N/A 06/09/2021   Procedure: LEFT HEART CATH AND CORONARY ANGIOGRAPHY;  Surgeon: Martinique,  Peter M, MD;  Location: [facility];  Service: Cardiovascular;  Laterality: N/A;   skin cancer removed right ear     TRANSFORAMINAL LUMBAR INTERBODY FUSION (TLIF) WITH PEDICLE SCREW FIXATION 2 LEVEL N/A 07/06/2018   Procedure: TRANSFORAMINAL LUMBAR INTERBODY FUSION (TLIF) L4-S1;  Surgeon: Melina Schools, MD;  Location: [facility];  Service: Orthopedics;  Laterality: N/A;       Family History  Problem Relation Age of Onset   Heart attack Father 44       Died suddenly   Hypertension Father    Vision loss Sister        one eye   Cancer Brother        skin   Diabetes Brother     Social History   Tobacco Use   Smoking status: Never   Smokeless tobacco: Never  Vaping Use   Vaping Use: Never used  Substance Use Topics   Alcohol use: Yes    Alcohol/week: 6.0 standard drinks    Types: 6 Cans of beer per week   Drug use: No    Home Medications Prior to Admission medications   Medication Sig Start Date End Date Taking? Authorizing Provider  amLODipine (NORVASC) 5 MG tablet Take 1.5 tablets (7.5 mg total) by mouth daily. 07/02/21   Minus Breeding, MD  aspirin 81 MG EC tablet Take 1 tablet (81 mg total) by mouth daily. Swallow whole. 06/13/21   Kroeger, Lorelee Cover., PA-C  atorvastatin (LIPITOR) 80 MG tablet Take 1 tablet (80 mg total) by mouth daily. 06/13/21   Kroeger, Lorelee Cover., PA-C  carvedilol (COREG) 12.5 MG tablet Take 1 tablet (12.5 mg total) by mouth 2 (two) times daily with a meal. 06/12/21   Kroeger, Lorelee Cover., PA-C  cloNIDine (CATAPRES) 0.3 MG tablet Take 1 tablet (0.3 mg total) by mouth 2 (two) times daily. 08/28/20   [name], DO  empagliflozin (JARDIANCE) 10 MG TABS tablet Take 1 tablet (10 mg total) by mouth daily before breakfast. 06/12/21   Kroeger, Lorelee Cover., PA-C  gabapentin (NEURONTIN) 100 MG capsule Take 1 capsule (100 mg total) by mouth 2 (two) times daily. 03/30/21   [name], DO  insulin degludec (TRESIBA FLEXTOUCH) 100 UNIT/ML FlexTouch Pen  Inject 38-46 units daily as directed (increase by 1 unit every other day until sugar is below 150) 07/03/21   Gottschalk, Ashly M, DO  Insulin Pen Needle (PEN NEEDLES) 31G X 5 MM MISC Use daily with insulin Dx E11.9 12/09/20   Ronnie Doss M, DO  nitroGLYCERIN (NITROSTAT) 0.4 MG SL tablet Place 1 tablet (0.4 mg total) under the tongue every 5 (five) minutes as needed for chest pain. 06/12/21 06/12/22  Kroeger, Lorelee Cover., PA-C  tamsulosin (FLOMAX) 0.4 MG CAPS capsule Take 1 capsule (0.4 mg total) by mouth daily after supper. 09/29/20   [name], DO  ticagrelor (BRILINTA) 90 MG TABS tablet Take 1 tablet (90 mg total) by mouth 2 (two) times daily. 06/12/21   Kroeger, Lorelee Cover., PA-C    Allergies  Patient has no known allergies.  Review of Systems   Review of Systems  Constitutional:  Negative for chills, fatigue and fever.  HENT:  Negative for ear pain and sore throat.   Eyes:  Negative for pain and visual disturbance.  Respiratory:  Negative for cough and shortness of breath.   Cardiovascular:  Positive for chest pain and leg swelling. Negative for palpitations.  Gastrointestinal:  Positive for nausea and vomiting. Negative for abdominal pain.  Genitourinary:  Negative for dysuria and hematuria.  Musculoskeletal:  Negative for arthralgias and back pain.  Skin:  Negative for color change and rash.  Neurological:  Negative for seizures and syncope.  All other systems reviewed and are negative.  Physical Exam Updated Vital Signs BP 139/63 (BP Location: Left Arm)   Pulse (!) 58   Temp 98.5 F (36.9 C) (Oral)   Resp 14   SpO2 99%   Physical Exam Vitals and nursing note reviewed. Exam conducted with a chaperone present.  Constitutional:      Appearance: Normal appearance. He is obese.  HENT:     Head: Normocephalic and atraumatic.  Eyes:     General: No scleral icterus.       Right eye: No discharge.        Left eye: No discharge.     Extraocular Movements: Extraocular  movements intact.     Pupils: Pupils are equal, round, and reactive to light.  Cardiovascular:     Rate and Rhythm: Normal rate and regular rhythm.     Pulses: Normal pulses.     Heart sounds: Normal heart sounds. No murmur heard.   No friction rub. No gallop.  Pulmonary:     Effort: Pulmonary effort is normal. No respiratory distress.     Breath sounds: Normal breath sounds.  Abdominal:     General: Abdomen is flat. Bowel sounds are normal. There is no distension.     Palpations: Abdomen is soft.     Tenderness: There is no abdominal tenderness.  Musculoskeletal:        General: Swelling present.     Comments: 1+ pitting edema bilaterally.  Legs are symmetric in size.  Skin:    General: Skin is warm and dry.     Coloration: Skin is not jaundiced.  Neurological:     Mental Status: He is alert. Mental status is at baseline.     Coordination: Coordination normal.    ED Results / Procedures / Treatments   Labs (all labs ordered are listed, but only abnormal results are displayed) Labs Reviewed  BASIC METABOLIC PANEL - Abnormal; Notable for the following components:      Result Value   Potassium 3.2 (*)    Glucose, Bld 106 (*)    Creatinine, Ser 1.82 (*)    Calcium 8.6 (*)    GFR, Estimated 40 (*)    All other components within normal limits  CBC - Abnormal; Notable for the following components:   WBC 10.9 (*)    RBC 3.33 (*)    Hemoglobin 9.8 (*)    HCT 28.9 (*)    All other components within normal limits  TROPONIN I (HIGH SENSITIVITY)  TROPONIN I (HIGH SENSITIVITY)    EKG EKG Interpretation  Date/Time:  Monday July 13 2021 11:07:45 EDT Ventricular Rate:  65 PR Interval:  216 QRS Duration: 100 QT Interval:  414 QTC Calculation: 430 R Axis:   0 Text Interpretation: Sinus rhythm with 1st degree A-V block  subtle ST  abnormalities similar to July 2022 Confirmed by Sherwood Gambler [phone]) on 07/13/2021 3:31:59 PM  Radiology DG Chest 1 View  Result Date:  07/13/2021 CLINICAL DATA:  Chest pain. EXAM: CHEST  1 VIEW COMPARISON:  June 07, 2021. FINDINGS: The heart size and mediastinal contours are within normal limits. Both lungs are clear. The visualized skeletal structures are unremarkable. IMPRESSION: No active disease. Electronically Signed   By: Marijo Conception M.D.   On: 07/13/2021 11:55    Procedures Procedures   Medications Ordered in ED Medications  aspirin chewable tablet 324 mg (has no administration in time range)    ED Course  I have reviewed the triage vital signs and the nursing notes.  Pertinent labs & imaging results that were available during my care of the patient were reviewed by me and considered in my medical decision making (see chart for details).  Clinical Course as of 07/13/21 1847  Mon Jul 13, 2021  5027 Basic metabolic panel(!) Mild right hypokalemia, creatinine and GFR at baseline. [HS]  1601 CBC(!) Patient has a mild leukocytosis, mild anemia but appears to be at baseline per chart review. [HS]  1602 Troponin I (High Sensitivity) Delta troponin negative [HS]    Clinical Course User Index [HS] Sherrill Raring, PA-C   MDM Rules/Calculators/A&P                           Patient is hypertensive, otherwise vitals are stable.  He is not in any acute distress, nontoxic-appearing.  We will check labs especially given his history of recent MI.  Please see ED course for interpretation of labs and imaging as they pertain to the differential.  Patient has had negative delta troponins, his heart score is 7.  I will consult with cardiology further recommendations of close outpatient follow-up versus admission for observation.  Negative delta troponin makes ACS less likely.  EKG is also without acute findings.  However, pain is improved with nitroglycerin and he has a multitude of risk factors.  I spoke with Dr. Harl Bowie with [facility].  We discussed the patient's case, and he independently reviewed the patient's history  before speaking with me.  I told him that the chest pain improved with nitroglycerin, he recently had 3 stents placed, chest pain is intermittent but reproducible with neck pain and movement.  He is aware and reviewed the case on epic and saw that the troponins have not increased and there were no EKG changes.  We discussed that his heart score is 7, Dr. Harl Bowie believes it is appropriate for the patient to follow-up outpatient and to increase his Norstat to 10 mg.  On reevaluation, patient states his pain is improved significantly.  He is hypertensive, but otherwise vitals are stable.  Discussed his results with him as well as the advice of cardiology, patient verbalized understanding.  Patient is aware that they should be receiving a call from the cardiology office, but if they do not hear from them first thing in the morning to call them back before lunch tomorrow to schedule follow-up appointment.  They have very strict return precautions and have verbalized understanding.  Final Clinical Impression(s) / ED Diagnoses Final diagnoses:  Chest pain    Rx / DC Orders ED Discharge Orders     None        Sherrill Raring, [place] 07/13/21 Yevette Edwards    Sherwood Gambler, MD 07/14/21 [phone]

## 2021-07-13 NOTE — ED Provider Notes (Signed)
Emergency Medicine Provider Triage Evaluation Note  [name] , a 65 y.o. male  was evaluated in triage.  Pt complains of cp.  Review of Systems  Positive: Cp Negative: Fever, chills, cough, sob, diaphoresis, n/v  Physical Exam  BP (!) 146/68 (BP Location: Left Arm)   Pulse 64   Temp 98.3 F (36.8 C) (Oral)   Resp 16   SpO2 98%  Gen:   Awake, no distress   Resp:  Normal effort  MSK:   Moves extremities without difficulty  Other:    Medical Decision Making  Medically screening exam initiated at 11:18 AM.  Appropriate orders placed.  [name] was informed that the remainder of the evaluation will be completed by another provider, this initial triage assessment does not replace that evaluation, and the importance of remaining in the ED until their evaluation is complete.  Pt with pain to L chest since last night, felt similar to prior cardiac pain that require stenting last month.  Did took SL nitro with improvement.  Pain worse with movement.    Domenic Moras, PA-C 07/13/21 1120    Godfrey Pick, MD 07/15/21 [phone]

## 2021-07-14 DIAGNOSIS — L820 Inflamed seborrheic keratosis: Secondary | ICD-10-CM | POA: Diagnosis not present

## 2021-07-14 DIAGNOSIS — D225 Melanocytic nevi of trunk: Secondary | ICD-10-CM | POA: Diagnosis not present

## 2021-07-14 DIAGNOSIS — L570 Actinic keratosis: Secondary | ICD-10-CM | POA: Diagnosis not present

## 2021-07-14 DIAGNOSIS — D692 Other nonthrombocytopenic purpura: Secondary | ICD-10-CM | POA: Diagnosis not present

## 2021-07-14 DIAGNOSIS — Z85828 Personal history of other malignant neoplasm of skin: Secondary | ICD-10-CM | POA: Diagnosis not present

## 2021-07-19 ENCOUNTER — Inpatient Hospital Stay (HOSPITAL_COMMUNITY)
Admission: EM | Admit: 2021-07-19 | Discharge: 2021-07-24 | DRG: 286 | Disposition: A | Discharge status: Home / Self Care | Payer: PPO | Attending: Cardiology | Admitting: Cardiology

## 2021-07-19 ENCOUNTER — Other Ambulatory Visit: Payer: Self-pay

## 2021-07-19 ENCOUNTER — Emergency Department (HOSPITAL_COMMUNITY): Payer: PPO

## 2021-07-19 DIAGNOSIS — I130 Hypertensive heart and chronic kidney disease with heart failure and stage 1 through stage 4 chronic kidney disease, or unspecified chronic kidney disease: Principal | ICD-10-CM | POA: Diagnosis present

## 2021-07-19 DIAGNOSIS — I5041 Acute combined systolic (congestive) and diastolic (congestive) heart failure: Secondary | ICD-10-CM | POA: Diagnosis not present

## 2021-07-19 DIAGNOSIS — E1122 Type 2 diabetes mellitus with diabetic chronic kidney disease: Secondary | ICD-10-CM | POA: Diagnosis present

## 2021-07-19 DIAGNOSIS — M5136 Other intervertebral disc degeneration, lumbar region: Secondary | ICD-10-CM | POA: Diagnosis not present

## 2021-07-19 DIAGNOSIS — Z794 Long term (current) use of insulin: Secondary | ICD-10-CM

## 2021-07-19 DIAGNOSIS — I10 Essential (primary) hypertension: Secondary | ICD-10-CM | POA: Diagnosis not present

## 2021-07-19 DIAGNOSIS — Z7902 Long term (current) use of antithrombotics/antiplatelets: Secondary | ICD-10-CM

## 2021-07-19 DIAGNOSIS — I2510 Atherosclerotic heart disease of native coronary artery without angina pectoris: Secondary | ICD-10-CM | POA: Diagnosis not present

## 2021-07-19 DIAGNOSIS — E785 Hyperlipidemia, unspecified: Secondary | ICD-10-CM | POA: Diagnosis not present

## 2021-07-19 DIAGNOSIS — Z79899 Other long term (current) drug therapy: Secondary | ICD-10-CM

## 2021-07-19 DIAGNOSIS — I252 Old myocardial infarction: Secondary | ICD-10-CM

## 2021-07-19 DIAGNOSIS — I509 Heart failure, unspecified: Secondary | ICD-10-CM

## 2021-07-19 DIAGNOSIS — Z89421 Acquired absence of other right toe(s): Secondary | ICD-10-CM | POA: Diagnosis not present

## 2021-07-19 DIAGNOSIS — E8809 Other disorders of plasma-protein metabolism, not elsewhere classified: Secondary | ICD-10-CM | POA: Diagnosis not present

## 2021-07-19 DIAGNOSIS — Z833 Family history of diabetes mellitus: Secondary | ICD-10-CM

## 2021-07-19 DIAGNOSIS — Z8249 Family history of ischemic heart disease and other diseases of the circulatory system: Secondary | ICD-10-CM | POA: Diagnosis not present

## 2021-07-19 DIAGNOSIS — E876 Hypokalemia: Secondary | ICD-10-CM | POA: Diagnosis not present

## 2021-07-19 DIAGNOSIS — N1832 Chronic kidney disease, stage 3b: Secondary | ICD-10-CM | POA: Diagnosis present

## 2021-07-19 DIAGNOSIS — J90 Pleural effusion, not elsewhere classified: Secondary | ICD-10-CM | POA: Diagnosis not present

## 2021-07-19 DIAGNOSIS — D631 Anemia in chronic kidney disease: Secondary | ICD-10-CM | POA: Diagnosis present

## 2021-07-19 DIAGNOSIS — I5020 Unspecified systolic (congestive) heart failure: Secondary | ICD-10-CM

## 2021-07-19 DIAGNOSIS — Z7984 Long term (current) use of oral hypoglycemic drugs: Secondary | ICD-10-CM | POA: Diagnosis not present

## 2021-07-19 DIAGNOSIS — I25119 Atherosclerotic heart disease of native coronary artery with unspecified angina pectoris: Secondary | ICD-10-CM | POA: Diagnosis not present

## 2021-07-19 DIAGNOSIS — D72829 Elevated white blood cell count, unspecified: Secondary | ICD-10-CM | POA: Diagnosis not present

## 2021-07-19 DIAGNOSIS — R072 Precordial pain: Secondary | ICD-10-CM | POA: Diagnosis not present

## 2021-07-19 DIAGNOSIS — Z20822 Contact with and (suspected) exposure to covid-19: Secondary | ICD-10-CM | POA: Diagnosis present

## 2021-07-19 DIAGNOSIS — I5021 Acute systolic (congestive) heart failure: Secondary | ICD-10-CM | POA: Diagnosis not present

## 2021-07-19 DIAGNOSIS — R0602 Shortness of breath: Secondary | ICD-10-CM | POA: Diagnosis not present

## 2021-07-19 DIAGNOSIS — Z7982 Long term (current) use of aspirin: Secondary | ICD-10-CM | POA: Diagnosis not present

## 2021-07-19 DIAGNOSIS — I152 Hypertension secondary to endocrine disorders: Secondary | ICD-10-CM | POA: Diagnosis present

## 2021-07-19 DIAGNOSIS — R069 Unspecified abnormalities of breathing: Secondary | ICD-10-CM | POA: Diagnosis not present

## 2021-07-19 DIAGNOSIS — E1169 Type 2 diabetes mellitus with other specified complication: Secondary | ICD-10-CM | POA: Diagnosis not present

## 2021-07-19 DIAGNOSIS — I110 Hypertensive heart disease with heart failure: Secondary | ICD-10-CM | POA: Diagnosis not present

## 2021-07-19 DIAGNOSIS — R609 Edema, unspecified: Secondary | ICD-10-CM | POA: Diagnosis not present

## 2021-07-19 DIAGNOSIS — E1159 Type 2 diabetes mellitus with other circulatory complications: Secondary | ICD-10-CM | POA: Diagnosis present

## 2021-07-19 DIAGNOSIS — Z9861 Coronary angioplasty status: Secondary | ICD-10-CM | POA: Diagnosis not present

## 2021-07-19 DIAGNOSIS — I5031 Acute diastolic (congestive) heart failure: Secondary | ICD-10-CM | POA: Diagnosis not present

## 2021-07-19 HISTORY — DX: Acute combined systolic (congestive) and diastolic (congestive) heart failure: I50.41

## 2021-07-19 LAB — TROPONIN I (HIGH SENSITIVITY)
Troponin I (High Sensitivity): 54 ng/L — ABNORMAL HIGH (ref <18)
Troponin I (High Sensitivity): 62 ng/L — ABNORMAL HIGH (ref <18)

## 2021-07-19 LAB — GLUCOSE, CAPILLARY: Glucose-Capillary: 168 mg/dL — ABNORMAL HIGH (ref 70–99)

## 2021-07-19 LAB — RESP PANEL BY RT-PCR (FLU A&B, COVID) ARPGX2
Influenza A by PCR: NEGATIVE
Influenza B by PCR: NEGATIVE
SARS Coronavirus 2 by RT PCR: NEGATIVE

## 2021-07-19 LAB — CBC
HCT: 29.1 % — ABNORMAL LOW (ref 39.0–52.0)
Hemoglobin: 9.6 g/dL — ABNORMAL LOW (ref 13.0–17.0)
MCH: 29.2 pg (ref 26.0–34.0)
MCHC: 33 g/dL (ref 30.0–36.0)
MCV: 88.4 fL (ref 80.0–100.0)
Platelets: 219 10*3/uL (ref 150–400)
RBC: 3.29 MIL/uL — ABNORMAL LOW (ref 4.22–5.81)
RDW: 14.2 % (ref 11.5–15.5)
WBC: 10.8 10*3/uL — ABNORMAL HIGH (ref 4.0–10.5)
nRBC: 0 % (ref 0.0–0.2)

## 2021-07-19 LAB — BASIC METABOLIC PANEL
Anion gap: 6 (ref 5–15)
BUN: 23 mg/dL (ref 8–23)
CO2: 24 mmol/L (ref 22–32)
Calcium: 8.4 mg/dL — ABNORMAL LOW (ref 8.9–10.3)
Chloride: 107 mmol/L (ref 98–111)
Creatinine, Ser: 1.85 mg/dL — ABNORMAL HIGH (ref 0.61–1.24)
GFR, Estimated: 39 mL/min — ABNORMAL LOW (ref >60)
Glucose, Bld: 165 mg/dL — ABNORMAL HIGH (ref 70–99)
Potassium: 3.9 mmol/L (ref 3.5–5.1)
Sodium: 137 mmol/L (ref 135–145)

## 2021-07-19 LAB — BRAIN NATRIURETIC PEPTIDE: B Natriuretic Peptide: 802 pg/mL — ABNORMAL HIGH (ref 0.0–100.0)

## 2021-07-19 MED ORDER — CARVEDILOL 12.5 MG PO TABS
12.5000 mg | ORAL_TABLET | Freq: Two times a day (BID) | ORAL | Status: DC
  Administered 2021-07-20: 12.5 mg via ORAL
  Filled 2021-07-19 (×2): qty 1

## 2021-07-19 MED ORDER — TICAGRELOR 90 MG PO TABS
90.0000 mg | ORAL_TABLET | Freq: Two times a day (BID) | ORAL | Status: DC
  Administered 2021-07-19 – 2021-07-24 (×10): 90 mg via ORAL
  Filled 2021-07-19 (×10): qty 1

## 2021-07-19 MED ORDER — CLONIDINE HCL 0.3 MG PO TABS
0.3000 mg | ORAL_TABLET | Freq: Two times a day (BID) | ORAL | Status: DC
  Administered 2021-07-19 – 2021-07-24 (×10): 0.3 mg via ORAL
  Filled 2021-07-19 (×10): qty 1

## 2021-07-19 MED ORDER — GABAPENTIN 100 MG PO CAPS
100.0000 mg | ORAL_CAPSULE | Freq: Two times a day (BID) | ORAL | Status: DC
  Administered 2021-07-19 – 2021-07-24 (×10): 100 mg via ORAL
  Filled 2021-07-19 (×10): qty 1

## 2021-07-19 MED ORDER — FUROSEMIDE 10 MG/ML IJ SOLN: 40.0000 mg | Freq: Once | INTRAMUSCULAR | Status: AC

## 2021-07-19 MED ORDER — AMLODIPINE BESYLATE 5 MG PO TABS
5.0000 mg | ORAL_TABLET | Freq: Every day | ORAL | Status: DC
  Administered 2021-07-20: 5 mg via ORAL
  Filled 2021-07-19 (×2): qty 1

## 2021-07-19 MED ORDER — EMPAGLIFLOZIN 10 MG PO TABS
10.0000 mg | ORAL_TABLET | Freq: Every day | ORAL | Status: DC
  Administered 2021-07-20 – 2021-07-24 (×5): 10 mg via ORAL
  Filled 2021-07-19 (×5): qty 1

## 2021-07-19 MED ORDER — ACETAMINOPHEN 325 MG PO TABS: 650.0000 mg | ORAL_TABLET | ORAL | Status: DC | PRN

## 2021-07-19 MED ORDER — FUROSEMIDE 10 MG/ML IJ SOLN
40.0000 mg | Freq: Once | INTRAMUSCULAR | Status: AC
  Administered 2021-07-19: 40 mg via INTRAVENOUS
  Filled 2021-07-19: qty 4

## 2021-07-19 MED ORDER — ONDANSETRON HCL 4 MG/2ML IJ SOLN: 4.0000 mg | Freq: Four times a day (QID) | INTRAMUSCULAR | Status: DC | PRN

## 2021-07-19 MED ORDER — SODIUM CHLORIDE 0.9 % IV SOLN: 250.0000 mL | INTRAVENOUS | Status: DC | PRN

## 2021-07-19 MED ORDER — HEPARIN SODIUM (PORCINE) 5000 UNIT/ML IJ SOLN
5000.0000 [IU] | Freq: Three times a day (TID) | INTRAMUSCULAR | Status: DC
  Administered 2021-07-19 – 2021-07-23 (×11): 5000 [IU] via SUBCUTANEOUS
  Filled 2021-07-19 (×11): qty 1

## 2021-07-19 MED ORDER — FUROSEMIDE 10 MG/ML IJ SOLN
INTRAMUSCULAR | Status: AC
  Administered 2021-07-19: 40 mg
  Filled 2021-07-19: qty 4

## 2021-07-19 MED ORDER — INSULIN DETEMIR 100 UNIT/ML ~~LOC~~ SOLN
20.0000 [IU] | Freq: Every day | SUBCUTANEOUS | Status: DC
  Administered 2021-07-20 – 2021-07-23 (×4): 20 [IU] via SUBCUTANEOUS
  Filled 2021-07-19 (×6): qty 0.2

## 2021-07-19 MED ORDER — SODIUM CHLORIDE 0.9% FLUSH
3.0000 mL | Freq: Two times a day (BID) | INTRAVENOUS | Status: DC
  Administered 2021-07-19 – 2021-07-24 (×10): 3 mL via INTRAVENOUS

## 2021-07-19 MED ORDER — INSULIN ASPART 100 UNIT/ML IJ SOLN
0.0000 [IU] | Freq: Three times a day (TID) | INTRAMUSCULAR | Status: DC
  Administered 2021-07-20 (×2): 2 [IU] via SUBCUTANEOUS
  Administered 2021-07-21 (×2): 3 [IU] via SUBCUTANEOUS
  Administered 2021-07-22 (×2): 2 [IU] via SUBCUTANEOUS
  Administered 2021-07-23: 3 [IU] via SUBCUTANEOUS
  Administered 2021-07-23: 2 [IU] via SUBCUTANEOUS
  Administered 2021-07-23: 1 [IU] via SUBCUTANEOUS
  Administered 2021-07-24: 3 [IU] via SUBCUTANEOUS

## 2021-07-19 MED ORDER — TAMSULOSIN HCL 0.4 MG PO CAPS
0.4000 mg | ORAL_CAPSULE | Freq: Every day | ORAL | Status: DC
  Administered 2021-07-20 – 2021-07-23 (×4): 0.4 mg via ORAL
  Filled 2021-07-19 (×5): qty 1

## 2021-07-19 MED ORDER — ATORVASTATIN CALCIUM 80 MG PO TABS
80.0000 mg | ORAL_TABLET | Freq: Every day | ORAL | Status: DC
  Administered 2021-07-20 – 2021-07-24 (×5): 80 mg via ORAL
  Filled 2021-07-19 (×5): qty 1

## 2021-07-19 MED ORDER — INSULIN ASPART 100 UNIT/ML IJ SOLN
0.0000 [IU] | Freq: Every day | INTRAMUSCULAR | Status: DC
Start: 2021-07-19 — End: 2021-07-24
  Administered 2021-07-23: 2 [IU] via SUBCUTANEOUS

## 2021-07-19 MED ORDER — ASPIRIN EC 81 MG PO TBEC
81.0000 mg | DELAYED_RELEASE_TABLET | Freq: Every day | ORAL | Status: DC
  Administered 2021-07-20 – 2021-07-24 (×4): 81 mg via ORAL
  Filled 2021-07-19 (×4): qty 1

## 2021-07-19 MED ORDER — SODIUM CHLORIDE 0.9% FLUSH: 3.0000 mL | INTRAVENOUS | Status: DC | PRN

## 2021-07-19 NOTE — ED Notes (Signed)
[facility] called to set up transportation at this time. 

## 2021-07-19 NOTE — ED Provider Notes (Signed)
[facility] EMERGENCY DEPARTMENT Provider Note   CSN: [id number] Arrival date & time: 07/19/21  1045     History Chief Complaint  Patient presents with   Shortness of Breath    [name] is a 65 y.o. male.  HPI   Pt is a 65 y/o male with a h/o CKD, DJD, DM, HTN, lumbar stenosis, NSTEMI, who presents to the ED today for eval of sob ongoing for the last week. States that sxs have been worsening since onset. He states he can barely complete his activities of daily living without significant sob. Further reports ble swelling, orthopnea that is worse over the last few weeks. He has intermittent chest pain that is not exertional. Pain is located to the left side of the chest. He has been compliant with his medications.   Reviewed prior records.  Patient was recently admitted for an NSTEMI.  At that time he underwent cardiac catheterization which showed three-vessel CAD.  He did receive stents but it was felt that the rest of his disease was not amenable to CABG/PCI so therefore it was recommended that he be treated medically at that time.  Past Medical History:  Diagnosis Date   Chronic kidney disease    Complication of anesthesia    Hard to wake   Degenerative joint disease (DJD) of lumbar spine    Diabetes mellitus without complication ([place])    Type II   Essential hypertension 02/23/2017   Lumbar stenosis    NSTEMI (non-ST elevated myocardial infarction) ([place]) 06/07/2021    Patient Active Problem List   Diagnosis Date Noted   Congestive heart failure (CHF) ([place]) 07/19/2021   CAD, multiple vessel 06/25/2021   Elevated troponin    DM (diabetes mellitus) type II, controlled, with peripheral vascular disorder (HCC)    Chronic kidney disease (CKD) stage G3b/A2, moderately decreased glomerular filtration rate (GFR) between 30-44 mL/min/1.73 square meter and albuminuria creatinine ratio between 30-299 mg/g (HCC)    Unstable angina ([place]) 06/07/2021   NSTEMI (non-ST elevated  myocardial infarction) ([place]) 06/07/2021   Chest pain 04/07/2021   Personal history of diabetic foot ulcer 06/18/2020   History of amputation of great toe ([place]) 06/18/2020   Osteomyelitis of great toe of right foot ([place])    Diabetic ulcer of toe of right foot associated with diabetes mellitus due to underlying condition, with necrosis of bone ([place])    Osteomyelitis ([name]) 05/15/2020   AKI (acute kidney injury) ([place]) 05/15/2020   OSA (obstructive sleep apnea) 05/15/2020   Acute kidney failure, unspecified ([place]) 05/15/2020   Cellulitis of right foot 05/07/2020   Type 2 diabetes mellitus ([place]) 05/07/2020   Cellulitis of right lower extremity    Diabetic infection of right foot ([place])    Cellulitis of great toe, right    Diabetic ulcer of toe of right foot associated with type 2 diabetes mellitus, with fat layer exposed ([place]) 04/15/2020   OSA on CPAP 09/01/2018   S/P lumbar fusion 07/06/2018   DDD (degenerative disc disease), lumbar 02/28/2018   Hypertension associated with diabetes ([place]) 02/23/2017   Benign prostatic hyperplasia with incomplete bladder emptying 02/23/2017   Vitamin D deficiency 02/23/2017   Hyperlipidemia associated with type 2 diabetes mellitus ([place]) 02/23/2017   Type 2 diabetes mellitus with stage 2 chronic kidney disease, without long-term current use of insulin ([place]) 02/23/2017   Hyperlipidemia, unspecified 02/23/2017   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    Past Surgical History:  Procedure Laterality Date   AMPUTATION  TOE Right 05/17/2020   Procedure: AMPUTATION TOE partial right great toe;  Surgeon: Evelina Bucy, DPM;  Location: [facility];  Service: Podiatry;  Laterality: Right;   BELPHAROPTOSIS REPAIR     CARDIAC CATHETERIZATION     CORONARY STENT INTERVENTION N/A 06/11/2021   Procedure: CORONARY STENT INTERVENTION;  Surgeon: Martinique, Peter M, MD;  Location: [facility];  Service: Cardiovascular;  Laterality: N/A;   EYE SURGERY     HEMORRHOID  SURGERY     INTRAVASCULAR ULTRASOUND/IVUS N/A 06/11/2021   Procedure: Intravascular Ultrasound/IVUS;  Surgeon: Martinique, Peter M, MD;  Location: [facility];  Service: Cardiovascular;  Laterality: N/A;   LEFT HEART CATH AND CORONARY ANGIOGRAPHY N/A 06/09/2021   Procedure: LEFT HEART CATH AND CORONARY ANGIOGRAPHY;  Surgeon: Martinique, Peter M, MD;  Location: [facility];  Service: Cardiovascular;  Laterality: N/A;   skin cancer removed right ear     TRANSFORAMINAL LUMBAR INTERBODY FUSION (TLIF) WITH PEDICLE SCREW FIXATION 2 LEVEL N/A 07/06/2018   Procedure: TRANSFORAMINAL LUMBAR INTERBODY FUSION (TLIF) L4-S1;  Surgeon: Melina Schools, MD;  Location: [facility];  Service: Orthopedics;  Laterality: N/A;       Family History  Problem Relation Age of Onset   Heart attack Father 74       Died suddenly   Hypertension Father    Vision loss Sister        one eye   Cancer Brother        skin   Diabetes Brother     Social History   Tobacco Use   Smoking status: Never   Smokeless tobacco: Never  Vaping Use   Vaping Use: Never used  Substance Use Topics   Alcohol use: Yes    Alcohol/week: 6.0 standard drinks    Types: 6 Cans of beer per week   Drug use: No    Home Medications Prior to Admission medications   Medication Sig Start Date End Date Taking? Authorizing Provider  amLODipine (NORVASC) 5 MG tablet Take 1.5 tablets (7.5 mg total) by mouth daily. 07/02/21   Minus Breeding, MD  aspirin 81 MG EC tablet Take 1 tablet (81 mg total) by mouth daily. Swallow whole. 06/13/21   Kroeger, Lorelee Cover., PA-C  atorvastatin (LIPITOR) 80 MG tablet Take 1 tablet (80 mg total) by mouth daily. 06/13/21   Kroeger, Lorelee Cover., PA-C  carvedilol (COREG) 12.5 MG tablet Take 1 tablet (12.5 mg total) by mouth 2 (two) times daily with a meal. 06/12/21   Kroeger, Lorelee Cover., PA-C  cloNIDine (CATAPRES) 0.3 MG tablet Take 1 tablet (0.3 mg total) by mouth 2 (two) times daily. Patient not taking: Reported on  07/13/2021 08/28/20   [name], DO  empagliflozin (JARDIANCE) 10 MG TABS tablet Take 1 tablet (10 mg total) by mouth daily before breakfast. 06/12/21   Kroeger, Lorelee Cover., PA-C  gabapentin (NEURONTIN) 100 MG capsule Take 1 capsule (100 mg total) by mouth 2 (two) times daily. 03/30/21   [name], DO  insulin degludec (TRESIBA FLEXTOUCH) 100 UNIT/ML FlexTouch Pen Inject 38-46 units daily as directed (increase by 1 unit every other day until sugar is below 150) 07/03/21   Gottschalk, Ashly M, DO  Insulin Pen Needle (PEN NEEDLES) 31G X 5 MM MISC Use daily with insulin Dx E11.9 12/09/20   Ronnie Doss M, DO  nitroGLYCERIN (NITROSTAT) 0.4 MG SL tablet Place 1 tablet (0.4 mg total) under the tongue every 5 (five) minutes as needed for chest  pain. 06/12/21 06/12/22  Kroeger, Lorelee Cover., PA-C  tamsulosin (FLOMAX) 0.4 MG CAPS capsule Take 1 capsule (0.4 mg total) by mouth daily after supper. 09/29/20   [name], DO  ticagrelor (BRILINTA) 90 MG TABS tablet Take 1 tablet (90 mg total) by mouth 2 (two) times daily. 06/12/21   Kroeger, Lorelee Cover., PA-C    Allergies    Patient has no known allergies.  Review of Systems   Review of Systems  Constitutional:  Positive for fatigue. Negative for chills and fever.  HENT:  Negative for ear pain and sore throat.   Eyes:  Negative for visual disturbance.  Respiratory:  Positive for shortness of breath. Negative for cough.   Cardiovascular:  Positive for chest pain and leg swelling.  Gastrointestinal:  Negative for abdominal pain, constipation, diarrhea, nausea and vomiting.  Genitourinary:  Negative for dysuria and hematuria.  Musculoskeletal:  Negative for back pain.  Skin:  Negative for color change and rash.  Neurological:  Negative for seizures and syncope.  All other systems reviewed and are negative.  Physical Exam Updated Vital Signs BP (!) 158/74   Pulse 71   Temp 98.3 F (36.8 C) (Oral)   Resp 19   Ht 6' (1.829 m)   Wt  93 kg   SpO2 97%   BMI 27.80 kg/m   Physical Exam Vitals and nursing note reviewed.  Constitutional:      Appearance: He is well-developed.  HENT:     Head: Normocephalic and atraumatic.  Eyes:     Conjunctiva/sclera: Conjunctivae normal.  Cardiovascular:     Rate and Rhythm: Normal rate and regular rhythm.     Heart sounds: Normal heart sounds. No murmur heard. Pulmonary:     Effort: Pulmonary effort is normal. Tachypnea present. No respiratory distress.     Breath sounds: Decreased breath sounds and rales present.     Comments: Conversationally dyspneic Abdominal:     Palpations: Abdomen is soft.     Tenderness: There is no abdominal tenderness.  Musculoskeletal:     Cervical back: Neck supple.     Comments: 3+ pitting edema to the BLE  Skin:    General: Skin is warm and dry.  Neurological:     Mental Status: He is alert.    ED Results / Procedures / Treatments   Labs (all labs ordered are listed, but only abnormal results are displayed) Labs Reviewed  BASIC METABOLIC PANEL - Abnormal; Notable for the following components:      Result Value   Glucose, Bld 165 (*)    Creatinine, Ser 1.85 (*)    Calcium 8.4 (*)    GFR, Estimated 39 (*)    All other components within normal limits  CBC - Abnormal; Notable for the following components:   WBC 10.8 (*)    RBC 3.29 (*)    Hemoglobin 9.6 (*)    HCT 29.1 (*)    All other components within normal limits  BRAIN NATRIURETIC PEPTIDE - Abnormal; Notable for the following components:   B Natriuretic Peptide 802.0 (*)    All other components within normal limits  TROPONIN I (HIGH SENSITIVITY) - Abnormal; Notable for the following components:   Troponin I (High Sensitivity) 54 (*)    All other components within normal limits  TROPONIN I (HIGH SENSITIVITY) - Abnormal; Notable for the following components:   Troponin I (High Sensitivity) 62 (*)    All other components within normal limits  RESP PANEL BY RT-PCR (FLU  A&B,  COVID) ARPGX2    EKG EKG Interpretation  Date/Time:  Sunday July 19 2021 11:00:11 EDT Ventricular Rate:  80 PR Interval:  191 QRS Duration: 99 QT Interval:  388 QTC Calculation: 448 R Axis:   14 Text Interpretation: Sinus rhythm Baseline wander in lead(s) I II aVR aVF Confirmed by Pickering, Nathan ([id number]) on 07/19/2021 1:20:39 PM  Radiology DG Chest Port 1 View  Result Date: 07/19/2021 CLINICAL DATA:  Shortness of breath over 6 days. EXAM: PORTABLE CHEST 1 VIEW COMPARISON:  Chest radiograph dated 07/13/2021. FINDINGS: The heart size and mediastinal contours are within normal limits. Mild diffuse bilateral interstitial and airspace opacities are noted. There is a small left pleural effusion. There is no right pleural effusion. There is no pneumothorax. The visualized skeletal structures are unremarkable. IMPRESSION: Mild diffuse bilateral interstitial and airspace opacities may represent pulmonary edema or less likely pneumonia. Small left pleural effusion. Electronically Signed   By: Tyler  Litton M.D.   On: 07/19/2021 12:10    Procedures Procedures   2:47 PM Cardiac monitoring reveals NSR, HR 80 (Rate & rhythm), as reviewed and interpreted by me. Cardiac monitoring was ordered due to chest pain, sob and to monitor patient for dysrhythmia.   Medications Ordered in ED Medications  furosemide (LASIX) injection 40 mg (40 mg Intravenous Given 07/19/21 1432)    ED Course  I have reviewed the triage vital signs and the nursing notes.  Pertinent labs & imaging results that were available during my care of the patient were reviewed by me and considered in my medical decision making (see chart for details).    MDM Rules/Calculators/A&P                          65  year old male presenting for evaluation of worsening dyspnea on exertion, peripheral edema and orthopnea.  He was recently admitted for an NSTEMI and had stents placed.  He was also found to have three-vessel CAD at that time  as well that was recommended to be managed medically.  He was seen again recently in the ED for evaluation of chest pain.  Cardiology was consulted at that time and recommended increase amlodipine.  He ultimately had negative troponins and a stable EKG at that time so was discharged.  He presents with worsening symptoms today.  Reviewed/interpreted labs CBC with mild leukocytosis and mild anemia that appear stable BMP with abnormal renal function that appears at baseline for the patient  BNP elevated at 800 Trop initially marginally elevated at 54 which I suspect is due to demand, delta trop pending on admission COVID negative  EKG - Sinus rhythm Baseline wander in lead(s) I II aVR aVF  Reviewed/interpreted imaging  CXR  - Mild diffuse bilateral interstitial and airspace opacities may represent pulmonary edema or less likely pneumonia. Small left pleural effusion.  Pt with new onset heart failure. Tx with lasix here in the ED. He is not hypoxic however he is significant dyspneic with his ADLs and even with conversation during my evaluation. Feel he will require admission for new onset heart failure. Given recent NSTEMI with significant CAD will consult cards for further recommendations on where patient would benefit most from admission.   2:36 PM CONSULT with Dr. Sallyanne Kuster with cardiology who recommends admission to [facility]. He accepts patient for admission.   Final Clinical Impression(s) / ED Diagnoses Final diagnoses:  Acute heart failure, unspecified heart failure type ([place])    Rx / DC Orders  ED Discharge Orders     None        Bishop Dublin 07/19/21 1448    Davonna Belling, MD 07/19/21 1525

## 2021-07-19 NOTE — ED Triage Notes (Signed)
Patient states [facility] for 6 days and went to [facility] on Monday and did not find anything wrong with the patient. Hx of HTN with bilateral leg swelling noted in triage. Denies CHF. Denies CP

## 2021-07-19 NOTE — H&P (Signed)
Cardiology Admission History and Physical:   Patient ID: [name] MRN: [id number]; DOB: Apr 03, 1956   Admission date: 07/19/2021  PCP:  [name], DO   [facility] Providers Cardiologist:  Minus Breeding, MD        Chief Complaint:  shortness of breath   Patient Profile:   [name] is a 65 y.o. male with hypertension, hyperlipidemia, coronary artery disease with recent NSTEMI/PCI, CKD, type 2 diabetes who is being seen 07/19/2021 for the evaluation of shortness of breath.  History of Present Illness:   [name] is a 65 year old male with hypertension, hyperlipidemia, coronary disease with recent NSTEMI/PCI, CKD, type 2 diabetes who presented to the emergency department with worsening dyspnea on exertion, lower extremity edema, and orthopnea.  On presentation to the emergency department he was noted to be hypertensive with mild leukocytosis, creatinine 1.8, BNP 800, mild elevation of troponin with minimal delta.  Chest x-ray with evidence of pulmonary edema.  He was given Lasix in the emergency department and had good urinary output.  Transferred to [facility] for ongoing management with cardiology.   Past Medical History:  Diagnosis Date   Chronic kidney disease    Complication of anesthesia    Hard to wake   Degenerative joint disease (DJD) of lumbar spine    Diabetes mellitus without complication ([place])    Type II   Essential hypertension 02/23/2017   Lumbar stenosis    NSTEMI (non-ST elevated myocardial infarction) ([place]) 06/07/2021    Past Surgical History:  Procedure Laterality Date   AMPUTATION TOE Right 05/17/2020   Procedure: AMPUTATION TOE partial right great toe;  Surgeon: Evelina Bucy, DPM;  Location: [facility];  Service: Podiatry;  Laterality: Right;   BELPHAROPTOSIS REPAIR     CARDIAC CATHETERIZATION     CORONARY STENT INTERVENTION N/A 06/11/2021   Procedure: CORONARY STENT INTERVENTION;  Surgeon: Martinique, Peter M, MD;  Location:  [place] [facility];  Service: Cardiovascular;  Laterality: N/A;   EYE SURGERY     HEMORRHOID SURGERY     INTRAVASCULAR ULTRASOUND/IVUS N/A 06/11/2021   Procedure: Intravascular Ultrasound/IVUS;  Surgeon: Martinique, Peter M, MD;  Location: [facility];  Service: Cardiovascular;  Laterality: N/A;   LEFT HEART CATH AND CORONARY ANGIOGRAPHY N/A 06/09/2021   Procedure: LEFT HEART CATH AND CORONARY ANGIOGRAPHY;  Surgeon: Martinique, Peter M, MD;  Location: [place] [facility];  Service: Cardiovascular;  Laterality: N/A;   skin cancer removed right ear     TRANSFORAMINAL LUMBAR INTERBODY FUSION (TLIF) WITH PEDICLE SCREW FIXATION 2 LEVEL N/A 07/06/2018   Procedure: TRANSFORAMINAL LUMBAR INTERBODY FUSION (TLIF) L4-S1;  Surgeon: Melina Schools, MD;  Location: [facility];  Service: Orthopedics;  Laterality: N/A;     Medications Prior to Admission: Prior to Admission medications   Medication Sig Start Date End Date Taking? Authorizing Provider  amLODipine (NORVASC) 5 MG tablet Take 1.5 tablets (7.5 mg total) by mouth daily. Patient taking differently: Take 5 mg by mouth daily. 07/02/21  Yes Minus Breeding, MD  aspirin 81 MG EC tablet Take 1 tablet (81 mg total) by mouth daily. Swallow whole. 06/13/21  Yes Kroeger, Lorelee Cover., PA-C  cloNIDine (CATAPRES) 0.3 MG tablet Take 1 tablet (0.3 mg total) by mouth 2 (two) times daily. 08/28/20  Yes Gottschalk, Leatrice Jewels M, DO  empagliflozin (JARDIANCE) 10 MG TABS tablet Take 1 tablet (10 mg total) by mouth daily before breakfast. 06/12/21  Yes Kroeger, Daleen Snook M., PA-C  gabapentin (NEURONTIN) 100 MG capsule  Take 1 capsule (100 mg total) by mouth 2 (two) times daily. 03/30/21  Yes Gottschalk, Ashly M, DO  insulin degludec (TRESIBA FLEXTOUCH) 100 UNIT/ML FlexTouch Pen Inject 38-46 units daily as directed (increase by 1 unit every other day until sugar is below 150) Patient taking differently: Inject into the skin daily. Inject 38-46 units daily as directed (increase by 1 unit every  other day until sugar is below 150) 07/03/21  Yes Gottschalk, Ashly M, DO  nitroGLYCERIN (NITROSTAT) 0.4 MG SL tablet Place 1 tablet (0.4 mg total) under the tongue every 5 (five) minutes as needed for chest pain. 06/12/21 06/12/22 Yes Kroeger, Lorelee Cover., PA-C  ticagrelor (BRILINTA) 90 MG TABS tablet Take 1 tablet (90 mg total) by mouth 2 (two) times daily. 06/12/21  Yes Kroeger, Lorelee Cover., PA-C  atorvastatin (LIPITOR) 80 MG tablet Take 1 tablet (80 mg total) by mouth daily. Patient not taking: No sig reported 06/13/21   Roby Lofts M., PA-C  carvedilol (COREG) 12.5 MG tablet Take 1 tablet (12.5 mg total) by mouth 2 (two) times daily with a meal. Patient not taking: No sig reported 06/12/21   Abigail Butts., PA-C  Insulin Pen Needle (PEN NEEDLES) 31G X 5 MM MISC Use daily with insulin Dx E11.9 12/09/20   Ronnie Doss M, DO  tamsulosin (FLOMAX) 0.4 MG CAPS capsule Take 1 capsule (0.4 mg total) by mouth daily after supper. Patient not taking: No sig reported 09/29/20   [name], DO     Allergies:   No Known Allergies  Social History:   Social History   Socioeconomic History   Marital status: Married    Spouse name: Not on file   Number of children: 3   Years of education: Not on file   Highest education level: Not on file  Occupational History   Not on file  Tobacco Use   Smoking status: Never   Smokeless tobacco: Never  Vaping Use   Vaping Use: Never used  Substance and Sexual Activity   Alcohol use: Yes    Alcohol/week: 6.0 standard drinks    Types: 6 Cans of beer per week   Drug use: No   Sexual activity: Yes  Other Topics Concern   Not on file  Social History Narrative   Lives with his wife - Has a son with cirrhosis who he stays with a lot. Has another son and one daughter    Social Determinants of Radio broadcast assistant Strain: Low Risk    Difficulty of Paying Living Expenses: Not hard at all  Food Insecurity: No Food Insecurity   Worried About  Charity fundraiser in the Last Year: Never true   Arboriculturist in the Last Year: Never true  Transportation Needs: No Transportation Needs   Lack of Transportation (Medical): No   Lack of Transportation (Non-Medical): No  Physical Activity: Sufficiently Active   Days of Exercise per Week: 7 days   Minutes of Exercise per Session: 60 min  Stress: No Stress Concern Present   Feeling of Stress : Not at all  Social Connections: Moderately Isolated   Frequency of Communication with Friends and Family: More than three times a week   Frequency of Social Gatherings with Friends and Family: More than three times a week   Attends Religious Services: Never   Marine scientist or Organizations: No   Attends Archivist Meetings: Never   Marital Status: Married  Human resources officer Violence:  Not At Risk   Fear of Current or Ex-Partner: No   Emotionally Abused: No   Physically Abused: No   Sexually Abused: No    Family History:   The patient's family history includes Cancer in his brother; Diabetes in his brother; Heart attack (age of onset: 21) in his father; Hypertension in his father; Vision loss in his sister.    ROS:  Please see the history of present illness.  All other ROS reviewed and negative.     Physical Exam/Data:   Vitals:   07/19/21 1730 07/19/21 1800 07/19/21 1816 07/19/21 1930  BP: (!) 161/74 (!) 171/73  (!) 182/92  Pulse: 75 74  83  Resp: 15 16  20   Temp:   97.8 F (36.6 C) 98.7 F (37.1 C)  TempSrc:   Oral Oral  SpO2:  97%  97%  Weight:    94.1 kg  Height:    6' (1.829 m)    Intake/Output Summary (Last 24 hours) at 07/19/2021 2015 Last data filed at 07/19/2021 1700 Gross per 24 hour  Intake --  Output 1400 ml  Net -1400 ml   Last 3 Weights 07/19/2021 07/19/2021 07/03/2021  Weight (lbs) 207 lb 7.3 oz 205 lb 201 lb 3.2 oz  Weight (kg) 94.1 kg 92.987 kg 91.264 kg     Body mass index is 28.14 kg/m.  General:  NAD HEENT: normal Lymph: no  adenopathy Neck: JVD to mandible  Endocrine:  No thryomegaly Vascular: No carotid bruits; FA pulses 2+ bilaterally without bruits  Cardiac:  normal S1, S2; RRR; no murmur  Lungs:  clear to auscultation bilaterally, no wheezing, rhonchi or rales  Abd: soft, nontender, no hepatomegaly  Ext: 1+ edema pitting bilaterally Musculoskeletal:  No deformities, BUE and BLE strength normal and equal Skin: warm and dry  Neuro:  CNs 2-12 intact, no focal abnormalities noted Psych:  Normal affect    EKG:  The ECG that was done  was personally reviewed and demonstrates sinus rhythm  Relevant CV Studies: LHC 06/11/21:   1st Mrg lesion is 95% stenosed.   A drug-eluting stent was successfully placed using a STENT ONYX FRONTIER 2.75X26.   Post intervention, there is a 0% residual stenosis.   Ost Cx to Prox Cx lesion is 65% stenosed.   A drug-eluting stent was successfully placed using a STENT ONYX FRONTIER 3.0X22.   Post intervention, there is a 0% residual stenosis.   Successful Bifurcation stenting of the proximal to mid LCx and first OM using IVUS guidance and [place] technique with DES x 2   Plan: DAPT for one year. Aggressive risk factor modification. Will treat his other disease medically.  Laboratory Data:  High Sensitivity Troponin:   Recent Labs  Lab 07/13/21 1119 07/13/21 1319 07/19/21 1200 07/19/21 1349  TROPONINIHS 16 16 54* 62*      Chemistry Recent Labs  Lab 07/13/21 1119 07/19/21 1200  NA 138 137  K 3.2* 3.9  CL 107 107  CO2 24 24  GLUCOSE 106* 165*  BUN 23 23  CREATININE 1.82* 1.85*  CALCIUM 8.6* 8.4*  GFRNONAA 40* 39*  ANIONGAP 7 6    No results for input(s): PROT, ALBUMIN, AST, ALT, ALKPHOS, BILITOT in the last 168 hours. Hematology Recent Labs  Lab 07/13/21 1119 07/19/21 1200  WBC 10.9* 10.8*  RBC 3.33* 3.29*  HGB 9.8* 9.6*  HCT 28.9* 29.1*  MCV 86.8 88.4  MCH 29.4 29.2  MCHC 33.9 33.0  RDW 14.6 14.2  PLT 178  219   BNP Recent Labs  Lab  07/19/21 1200  BNP 802.0*    DDimer No results for input(s): DDIMER in the last 168 hours.   Radiology/Studies:  DG Chest Port 1 View  Result Date: 07/19/2021 CLINICAL DATA:  Shortness of breath over 6 days. EXAM: PORTABLE CHEST 1 VIEW COMPARISON:  Chest radiograph dated 07/13/2021. FINDINGS: The heart size and mediastinal contours are within normal limits. Mild diffuse bilateral interstitial and airspace opacities are noted. There is a small left pleural effusion. There is no right pleural effusion. There is no pneumothorax. The visualized skeletal structures are unremarkable. IMPRESSION: Mild diffuse bilateral interstitial and airspace opacities may represent pulmonary edema or less likely pneumonia. Small left pleural effusion. Electronically Signed   By: Zerita Boers M.D.   On: 07/19/2021 12:10     Assessment and Plan:   New onset systolic HF.  Likely driven by severe coronary disease.  Niccoli is in heart failure with pulmonary edema, JVP to mandible, lower extremity edema.  Some minor improvement after first dose of diuretics, so we will repeat 40 mg IV Lasix.  We will restart him on carvedilol 12.5 mg as he is not at risk for cardiogenic shock given significant elevated blood pressures.  Continue empagliflozin.  We will order echo for EF assessment and if low consider starting Entresto as tolerated. CAD. Continue asa 81 mg and ticag. No ischemic symptoms or ECG changes.  HTN. Restart coreg 12.5. continue amlodipine 5 and clonidine for now. Should uptitrate coreg and initiate entresto if EF reduced and renal fxn remains stable.  HLD. Continue atorva 80 T2DM. Dose reduced basal insulin to 20u. SSI. Continue empagliflozin   Risk Assessment/Risk Scores:       New York Heart Association (NYHA) Functional Class NYHA Class IV     Severity of Illness: The appropriate patient status for this patient is INPATIENT. Inpatient status is judged to be reasonable and necessary in order to  provide the required intensity of service to ensure the patient's safety. The patient's presenting symptoms, physical exam findings, and initial radiographic and laboratory data in the context of their chronic comorbidities is felt to place them at high risk for further clinical deterioration. Furthermore, it is not anticipated that the patient will be medically stable for discharge from the hospital within 2 midnights of admission. The following factors support the patient status of inpatient.   " The patient's presenting symptoms include DOE, edema, orthopnea. " The worrisome physical exam findings include pulm edema. " The initial radiographic and laboratory data are worrisome because of pulm edema, elevated BNP. " The chronic co-morbidities include CAD, HTN, DM, HLD.   * I certify that at the point of admission it is my clinical judgment that the patient will require inpatient hospital care spanning beyond 2 midnights from the point of admission due to high intensity of service, high risk for further deterioration and high frequency of surveillance required.*   For questions or updates, please contact [place] Please consult [email or web address] for contact info under     Signed, Doyne Keel, MD  07/19/2021 8:15 PM

## 2021-07-20 ENCOUNTER — Encounter (HOSPITAL_COMMUNITY): Payer: Self-pay | Admitting: Internal Medicine

## 2021-07-20 ENCOUNTER — Inpatient Hospital Stay (HOSPITAL_COMMUNITY): Payer: PPO

## 2021-07-20 DIAGNOSIS — I5021 Acute systolic (congestive) heart failure: Secondary | ICD-10-CM

## 2021-07-20 DIAGNOSIS — I5041 Acute combined systolic (congestive) and diastolic (congestive) heart failure: Secondary | ICD-10-CM

## 2021-07-20 LAB — BASIC METABOLIC PANEL
Anion gap: 10 (ref 5–15)
BUN: 20 mg/dL (ref 8–23)
CO2: 25 mmol/L (ref 22–32)
Calcium: 8.9 mg/dL (ref 8.9–10.3)
Chloride: 104 mmol/L (ref 98–111)
Creatinine, Ser: 1.89 mg/dL — ABNORMAL HIGH (ref 0.61–1.24)
GFR, Estimated: 38 mL/min — ABNORMAL LOW (ref >60)
Glucose, Bld: 127 mg/dL — ABNORMAL HIGH (ref 70–99)
Potassium: 2.8 mmol/L — ABNORMAL LOW (ref 3.5–5.1)
Sodium: 139 mmol/L (ref 135–145)

## 2021-07-20 LAB — GLUCOSE, CAPILLARY
Glucose-Capillary: 108 mg/dL — ABNORMAL HIGH (ref 70–99)
Glucose-Capillary: 180 mg/dL — ABNORMAL HIGH (ref 70–99)
Glucose-Capillary: 176 mg/dL — ABNORMAL HIGH (ref 70–99)
Glucose-Capillary: 189 mg/dL — ABNORMAL HIGH (ref 70–99)

## 2021-07-20 LAB — ECHOCARDIOGRAM LIMITED
Height: 72 in
Weight: 3259.2 oz

## 2021-07-20 MED ORDER — POTASSIUM CHLORIDE CRYS ER 20 MEQ PO TBCR
40.0000 meq | EXTENDED_RELEASE_TABLET | Freq: Once | ORAL | Status: AC
  Administered 2021-07-20: 40 meq via ORAL
  Filled 2021-07-20: qty 2

## 2021-07-20 MED ORDER — CARVEDILOL 12.5 MG PO TABS
12.5000 mg | ORAL_TABLET | Freq: Once | ORAL | Status: AC
  Administered 2021-07-20: 12.5 mg via ORAL
  Filled 2021-07-20: qty 1

## 2021-07-20 MED ORDER — AMLODIPINE BESYLATE 5 MG PO TABS
5.0000 mg | ORAL_TABLET | Freq: Once | ORAL | Status: AC
  Administered 2021-07-20: 5 mg via ORAL
  Filled 2021-07-20: qty 1

## 2021-07-20 MED ORDER — AMLODIPINE BESYLATE 10 MG PO TABS
10.0000 mg | ORAL_TABLET | Freq: Every day | ORAL | Status: DC
  Administered 2021-07-21 – 2021-07-24 (×4): 10 mg via ORAL
  Filled 2021-07-20 (×4): qty 1

## 2021-07-20 MED ORDER — POTASSIUM CHLORIDE 10 MEQ/100ML IV SOLN
10.0000 meq | INTRAVENOUS | Status: AC
  Administered 2021-07-20 (×4): 10 meq via INTRAVENOUS
  Filled 2021-07-20 (×4): qty 100

## 2021-07-20 MED ORDER — CARVEDILOL 25 MG PO TABS
25.0000 mg | ORAL_TABLET | Freq: Two times a day (BID) | ORAL | Status: DC
  Administered 2021-07-20 – 2021-07-21 (×2): 25 mg via ORAL
  Filled 2021-07-20 (×3): qty 1

## 2021-07-20 NOTE — Progress Notes (Addendum)
Progress Note  Patient Name: [name] Date of Encounter: 07/20/2021  Primary Cardiologist: Minus Breeding, MD  Subjective   SOB improving today. Edema also improving. No CP this AM. Eating lunch. Overall feels well. Reports he ambulated in room and felt better. Reports compliance with all meds prior to admission.   Inpatient Medications    Scheduled Meds:  amLODipine  5 mg Oral Daily   aspirin EC  81 mg Oral Daily   atorvastatin  80 mg Oral Daily   carvedilol  12.5 mg Oral BID WC   cloNIDine  0.3 mg Oral BID   empagliflozin  10 mg Oral QAC breakfast   gabapentin  100 mg Oral BID   heparin  5,000 Units Subcutaneous Q8H   insulin aspart  0-5 Units Subcutaneous QHS   insulin aspart  0-9 Units Subcutaneous TID WC   insulin detemir  20 Units Subcutaneous QHS   sodium chloride flush  3 mL Intravenous Q12H   tamsulosin  0.4 mg Oral QPC supper   ticagrelor  90 mg Oral BID   Continuous Infusions:  sodium chloride     PRN Meds: sodium chloride, acetaminophen, ondansetron (ZOFRAN) IV, sodium chloride flush   Vital Signs    Vitals:   07/20/21 0241 07/20/21 0417 07/20/21 0716 07/20/21 1113  BP:  (!) 149/75 (!) 151/67 (!) 141/68  Pulse:  68 71 65  Resp:  16    Temp:  98.7 F (37.1 C) 98.4 F (36.9 C) 98.2 F (36.8 C)  TempSrc:  Oral Oral Oral  SpO2:  94% 98% 98%  Weight: 92.4 kg     Height:        Intake/Output Summary (Last 24 hours) at 07/20/2021 1215 Last data filed at 07/20/2021 0951 Gross per 24 hour  Intake 420 ml  Output 4700 ml  Net -4280 ml   Last 3 Weights 07/20/2021 07/19/2021 07/19/2021  Weight (lbs) 203 lb 11.2 oz 207 lb 7.3 oz 205 lb  Weight (kg) 92.398 kg 94.1 kg 92.987 kg     Telemetry    NSR - Personally Reviewed  Physical Exam   GEN: No acute distress.  HEENT: Normocephalic, atraumatic, sclera non-icteric. Neck: No JVD or bruits. Cardiac: RRR no murmurs, rubs, or gallops.  Respiratory: Coarse but clear to auscultation bilaterally.  Breathing is unlabored. GI: Soft, nontender, non-distended, BS +x 4. MS: no deformity. Extremities: No clubbing or cyanosis. No edema. Distal pedal pulses are 2+ and equal bilaterally. Neuro:  AAOx3. Follows commands. Psych:  Responds to questions appropriately with a normal affect.  Labs    High Sensitivity Troponin:   Recent Labs  Lab 07/13/21 1119 07/13/21 1319 07/19/21 1200 07/19/21 1349  TROPONINIHS 16 16 54* 62*      Cardiac EnzymesNo results for input(s): TROPONINI in the last 168 hours. No results for input(s): TROPIPOC in the last 168 hours.   Chemistry Recent Labs  Lab 07/19/21 1200 07/20/21 0126  NA 137 139  K 3.9 2.8*  CL 107 104  CO2 24 25  GLUCOSE 165* 127*  BUN 23 20  CREATININE 1.85* 1.89*  CALCIUM 8.4* 8.9  GFRNONAA 39* 38*  ANIONGAP 6 10     Hematology Recent Labs  Lab 07/19/21 1200  WBC 10.8*  RBC 3.29*  HGB 9.6*  HCT 29.1*  MCV 88.4  MCH 29.2  MCHC 33.0  RDW 14.2  PLT 219    BNP Recent Labs  Lab 07/19/21 1200  BNP 802.0*     DDimer  No results for input(s): DDIMER in the last 168 hours.   Radiology    DG Chest Port 1 View  Result Date: 07/19/2021 CLINICAL DATA:  Shortness of breath over 6 days. EXAM: PORTABLE CHEST 1 VIEW COMPARISON:  Chest radiograph dated 07/13/2021. FINDINGS: The heart size and mediastinal contours are within normal limits. Mild diffuse bilateral interstitial and airspace opacities are noted. There is a small left pleural effusion. There is no right pleural effusion. There is no pneumothorax. The visualized skeletal structures are unremarkable. IMPRESSION: Mild diffuse bilateral interstitial and airspace opacities may represent pulmonary edema or less likely pneumonia. Small left pleural effusion. Electronically Signed   By: Zerita Boers M.D.   On: 07/19/2021 12:10   ECHOCARDIOGRAM LIMITED  Result Date: 07/20/2021    ECHOCARDIOGRAM LIMITED REPORT   Patient Name:   [name] Date of Exam: 07/20/2021  Medical Rec #:  [id number]            Height:       72.0 in Accession #:    [id number]           Weight:       203.7 lb Date of Birth:  01/01/1956            BSA:          2.147 m Patient Age:    57 years             BP:           141/68 mmHg Patient Gender: M                    HR:           67 bpm. Exam Location:  Inpatient Procedure: Limited Echo Indications:    CHF-Acute Systolic Q22.97  History:        Patient has prior history of Echocardiogram examinations, most                 recent 06/11/2021. Previous Myocardial Infarction and CAD; Risk                 Factors:Diabetes, Hypertension and Dyslipidemia.  Sonographer:    Merrie Roof RDCS Referring Phys: [place]  1. Left ventricular ejection fraction, by estimation, is 55 to 60%. The left ventricle has normal function. The left ventricle has no regional wall motion abnormalities. There is mild left ventricular hypertrophy. Left ventricular diastolic parameters are indeterminate.  2. Right ventricular systolic function is normal. The right ventricular size is normal. Tricuspid regurgitation signal is inadequate for assessing PA pressure.  3. Left atrial size was mildly dilated.  4. The inferior vena cava is normal in size with greater than 50% respiratory variability, suggesting right atrial pressure of 3 mmHg.  5. Limited echo with no color doppler. FINDINGS  Left Ventricle: Left ventricular ejection fraction, by estimation, is 55 to 60%. The left ventricle has normal function. The left ventricle has no regional wall motion abnormalities. The left ventricular internal cavity size was normal in size. There is  mild left ventricular hypertrophy. Left ventricular diastolic parameters are indeterminate. Right Ventricle: The right ventricular size is normal. No increase in right ventricular wall thickness. Right ventricular systolic function is normal. Tricuspid regurgitation signal is inadequate for assessing PA pressure. Left Atrium: Left  atrial size was mildly dilated. Right Atrium: Right atrial size was normal in size. Pericardium: Trivial pericardial effusion is present. Aorta: The aortic root is normal in size and  structure. Venous: The inferior vena cava is normal in size with greater than 50% respiratory variability, suggesting right atrial pressure of 3 mmHg. IAS/Shunts: No atrial level shunt detected by color flow Doppler. LEFT VENTRICLE PLAX 2D LVIDd:         5.00 cm LV PW:         1.30 cm LV IVS:        1.30 cm   AORTA Ao Root diam: 2.90 cm Dalton McleanMD Electronically signed by Franki Monte Signature Date/Time: 07/20/2021/11:56:02 AM    Final     Cardiac Studies   2D echo 07/20/21 - limited   1. Left ventricular ejection fraction, by estimation, is 55 to 60%. The  left ventricle has normal function. The left ventricle has no regional  wall motion abnormalities. There is mild left ventricular hypertrophy.  Left ventricular diastolic parameters  are indeterminate.   2. Right ventricular systolic function is normal. The right ventricular  size is normal. Tricuspid regurgitation signal is inadequate for assessing  PA pressure.   3. Left atrial size was mildly dilated.   4. The inferior vena cava is normal in size with greater than 50%  respiratory variability, suggesting right atrial pressure of 3 mmHg.   5. Limited echo with no color doppler.   Patient Profile     65 y.o. male with CAD with recent NSTEMI 05/2021 with staged PCI/DES to LCx/OM1 bifurcation (other disease treated medically), CKD stage IIIb, chronic appearing anemia, DM, HTN, HLD . Recently seen in ED 07/13/21 with atypical chest pain and ruled out - at that time Dr. Harl Bowie suggested to increase amlodipine to 10mg  daily but do not see this occurred. Returned to hospital yesterday with worsening DOE, orthopnea, and HTN. He was admitted for diastolic CHF.  Assessment & Plan    1. Acute diastolic CHF - inciting factor not clear, possibly poorly controlled  HTN - diuresed well with IV Lasix given yesterday - given improvement in volume status and hypokalemia we'll hold off on additional Lasix today but continue to work on BP control - per d/w Dr.Raylynn Hersh, increase amlodipine to 10mg  daily (was on 7.5mg  at home) and increase carvedilol to 25mg  BID - have adjusted start time on carvedilol in [facility] to allow him to get the 12.5mg  this morning to supplement previous 12.5mg  dose, the start 25mg  BID this PM - consideration of ACEI/ARB as OP once not actively diuresing - also sees nephrology so would involve them in decision making - f/u labs in AM  2. Hypokalemia - received 77meq orally this AM, also given 45meq in IV form - f/u BMET in AM  3. CKD stage 3b - baseline Cr appears around this 1.8 range, stable, follow  4. CAD s/p recent NSTEMI/PCI 05/2021 - troponins low/flat 54->62, not suggestive of ACS - suspect demand ischemia process (d/w MD) - continue ASA, Brilinta, BB, statin - it was considered whether there was component of Brilinta causing his symptoms but given objective evidence of fluid overload, diastolic HF seems more likely  5. Essential HTN - follow BP with titration as above - also continue home clonidine for now  6. Hyperlipidemia - continue statin - check lipid profile in AM to reassess response to statin with LFTs- goal LDL <70  7. Anemia - this has been persistent since around 04/2020 - Hgb relatively stable compared to values in July - needs outpatient f/u for this - will place order for hemoccult and anemia panel for completeness along with CBC in aM  Possible DC in AM. Has close f/u already arranged 9/9, would likely plan to keep this appt.  For questions or updates, please contact [place] Please consult [email or web address] for contact info under Cardiology/STEMI.  Signed, Charlie Pitter, PA-C 07/20/2021, 12:15 PM    Patient examined chart reviewed Dyspnea better BP still up increase norvasc and beta blocker Supplement K  Exam with clear lungs no murmur Trace LE edema Likely d/c in am   Jenkins Rouge MD [facility]

## 2021-07-20 NOTE — Progress Notes (Signed)
   07/20/21 1326  Mobility  Activity Refused mobility (Patient reported being independent, ambulated in room without incident)

## 2021-07-20 NOTE — Progress Notes (Signed)
  Echocardiogram 2D Echocardiogram has been performed.  Merrie Roof F 07/20/2021, 11:30 AM

## 2021-07-21 DIAGNOSIS — R072 Precordial pain: Secondary | ICD-10-CM

## 2021-07-21 LAB — FERRITIN: Ferritin: 107 ng/mL (ref 24–336)

## 2021-07-21 LAB — GLUCOSE, CAPILLARY
Glucose-Capillary: 90 mg/dL (ref 70–99)
Glucose-Capillary: 230 mg/dL — ABNORMAL HIGH (ref 70–99)
Glucose-Capillary: 226 mg/dL — ABNORMAL HIGH (ref 70–99)
Glucose-Capillary: 167 mg/dL — ABNORMAL HIGH (ref 70–99)

## 2021-07-21 LAB — IRON AND TIBC
Iron: 31 ug/dL — ABNORMAL LOW (ref 45–182)
Saturation Ratios: 15 % — ABNORMAL LOW (ref 17.9–39.5)
TIBC: 207 ug/dL — ABNORMAL LOW (ref 250–450)
UIBC: 176 ug/dL

## 2021-07-21 LAB — HEPATIC FUNCTION PANEL
ALT: 11 U/L (ref 0–44)
AST: 15 U/L (ref 15–41)
Albumin: 2.6 g/dL — ABNORMAL LOW (ref 3.5–5.0)
Alkaline Phosphatase: 59 U/L (ref 38–126)
Bilirubin, Direct: 0.2 mg/dL (ref 0.0–0.2)
Indirect Bilirubin: 1 mg/dL — ABNORMAL HIGH (ref 0.3–0.9)
Total Bilirubin: 1.2 mg/dL (ref 0.3–1.2)
Total Protein: 5.7 g/dL — ABNORMAL LOW (ref 6.5–8.1)

## 2021-07-21 LAB — RETICULOCYTES
Immature Retic Fract: 23 % — ABNORMAL HIGH (ref 2.3–15.9)
RBC.: 3.03 MIL/uL — ABNORMAL LOW (ref 4.22–5.81)
Retic Count, Absolute: 69.1 10*3/uL (ref 19.0–186.0)
Retic Ct Pct: 2.3 % (ref 0.4–3.1)

## 2021-07-21 LAB — CBC
HCT: 25.2 % — ABNORMAL LOW (ref 39.0–52.0)
Hemoglobin: 8.5 g/dL — ABNORMAL LOW (ref 13.0–17.0)
MCH: 28.8 pg (ref 26.0–34.0)
MCHC: 33.7 g/dL (ref 30.0–36.0)
MCV: 85.4 fL (ref 80.0–100.0)
Platelets: 235 10*3/uL (ref 150–400)
RBC: 2.95 MIL/uL — ABNORMAL LOW (ref 4.22–5.81)
RDW: 13.9 % (ref 11.5–15.5)
WBC: 8.4 10*3/uL (ref 4.0–10.5)
nRBC: 0 % (ref 0.0–0.2)

## 2021-07-21 LAB — BASIC METABOLIC PANEL
Anion gap: 6 (ref 5–15)
BUN: 20 mg/dL (ref 8–23)
CO2: 25 mmol/L (ref 22–32)
Calcium: 8.5 mg/dL — ABNORMAL LOW (ref 8.9–10.3)
Chloride: 106 mmol/L (ref 98–111)
Creatinine, Ser: 1.89 mg/dL — ABNORMAL HIGH (ref 0.61–1.24)
GFR, Estimated: 38 mL/min — ABNORMAL LOW (ref >60)
Glucose, Bld: 119 mg/dL — ABNORMAL HIGH (ref 70–99)
Potassium: 3.1 mmol/L — ABNORMAL LOW (ref 3.5–5.1)
Sodium: 137 mmol/L (ref 135–145)

## 2021-07-21 LAB — LIPID PANEL
Cholesterol: 110 mg/dL (ref 0–200)
HDL: 25 mg/dL — ABNORMAL LOW (ref >40)
LDL Cholesterol: 61 mg/dL (ref 0–99)
Total CHOL/HDL Ratio: 4.4 RATIO
Triglycerides: 120 mg/dL (ref <150)
VLDL: 24 mg/dL (ref 0–40)

## 2021-07-21 LAB — MAGNESIUM: Magnesium: 2 mg/dL (ref 1.7–2.4)

## 2021-07-21 LAB — VITAMIN B12: Vitamin B-12: 369 pg/mL (ref 180–914)

## 2021-07-21 LAB — FOLATE: Folate: 16.4 ng/mL (ref >5.9)

## 2021-07-21 MED ORDER — POTASSIUM CHLORIDE CRYS ER 20 MEQ PO TBCR: 40.0000 meq | EXTENDED_RELEASE_TABLET | Freq: Once | ORAL | Status: DC

## 2021-07-21 MED ORDER — ISOSORBIDE MONONITRATE ER 60 MG PO TB24
60.0000 mg | ORAL_TABLET | Freq: Every day | ORAL | Status: DC
  Administered 2021-07-21 – 2021-07-24 (×4): 60 mg via ORAL
  Filled 2021-07-21 (×4): qty 1

## 2021-07-21 MED ORDER — CARVEDILOL 12.5 MG PO TABS
12.5000 mg | ORAL_TABLET | Freq: Two times a day (BID) | ORAL | Status: DC
  Administered 2021-07-21 – 2021-07-24 (×6): 12.5 mg via ORAL
  Filled 2021-07-21 (×6): qty 1

## 2021-07-21 MED ORDER — POTASSIUM CHLORIDE CRYS ER 20 MEQ PO TBCR
40.0000 meq | EXTENDED_RELEASE_TABLET | ORAL | Status: AC
  Administered 2021-07-21 (×2): 40 meq via ORAL
  Filled 2021-07-21 (×2): qty 2

## 2021-07-21 MED ORDER — FUROSEMIDE 10 MG/ML IJ SOLN
40.0000 mg | Freq: Once | INTRAMUSCULAR | Status: AC
  Administered 2021-07-21: 40 mg via INTRAVENOUS
  Filled 2021-07-21: qty 4

## 2021-07-21 NOTE — Progress Notes (Signed)
Nurse paged as most recent HR 55, BP 109/52, asked about plan for PM carvedilol (25mg ) due.  Carvedilol just increased yesterday. He was given IV Lasix today and also started on isosorbide.  We'll decrease his carvedilol back to 12.5mg  BID and push back dosing closer to 8pm with hold parameters.  Ravneet Spilker PA-C

## 2021-07-21 NOTE — Progress Notes (Signed)
   07/21/21 0930  Mobility  Head of Bed Elevated  Self regulated  Activity Ambulated in hall  Range of Motion/Exercises Active;All extremities  Level of Assistance Independent  Assistive Device None  Minutes Stood 5 minutes  Minutes Ambulated 5 minutes  Distance Ambulated (ft) 250 ft  Mobility Ambulated independently in hallway  Mobility Response Tolerated well  Mobility performed by Mobility specialist  Bed Position Semi-fowlers  $Mobility charge 1 Mobility

## 2021-07-21 NOTE — Plan of Care (Signed)
  Problem: Skin Integrity: ?Goal: Risk for impaired skin integrity will decrease ?Outcome: Completed/Met ?  ?Problem: Safety: ?Goal: Ability to remain free from injury will improve ?Outcome: Completed/Met ?  ?Problem: Pain Managment: ?Goal: General experience of comfort will improve ?Outcome: Completed/Met ?  ?Problem: Elimination: ?Goal: Will not experience complications related to urinary retention ?Outcome: Completed/Met ?  ?Problem: Elimination: ?Goal: Will not experience complications related to bowel motility ?Outcome: Completed/Met ?  ?Problem: Coping: ?Goal: Level of anxiety will decrease ?Outcome: Completed/Met ?  ?Problem: Nutrition: ?Goal: Adequate nutrition will be maintained ?Outcome: Completed/Met ?  ?Problem: Activity: ?Goal: Risk for activity intolerance will decrease ?Outcome: Completed/Met ?  ?

## 2021-07-21 NOTE — Progress Notes (Signed)
Heart Failure Nurse Navigator Progress Note  PCP: [name], DO PCP-Cardiologist: Vita Barley., MD Admission Diagnosis: CHF Admitted from: home with spouse  Presentation:   [name] presented on 9/4 with SOB and elevated BP. Pt sitting in recliner with legs down on room air with street clothes on. Pt interactive with interview process. 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 Pt states he drinks 3-8 beers per week. Never smoker. Is not in the donut hole for medicare with medications, only medication that is expensive is his insulin. Endorses taking medication as prescribed, "just haven't found the right ones for my blood pressure yet". States this is new for him as he had a recent MI and has only seen Dr. Percival Spanish once. Has follow up appt with Lawanda Cousins, PA in [place] this Friday 9/9. Prefers to go to [place] rather than travel back to [place] for appts. If unable to make appt this Friday d/t hospitalization, pt agreeable to HV TOC if he cannot get quick reschedule with [facility] office. Pt states he still drives and transportation is not an issue.   ECHO/ LVEF: 55-60%  Clinical Course:  Past Medical History:  Diagnosis Date   Chronic kidney disease    Complication of anesthesia    Hard to wake   Degenerative joint disease (DJD) of lumbar spine    Diabetes mellitus without complication ([place])    Type II   Essential hypertension 02/23/2017   Lumbar stenosis    NSTEMI (non-ST elevated myocardial infarction) ([place]) 06/07/2021     Social History   Socioeconomic History   Marital status: Married    Spouse name: Not on file   Number of children: 3   Years of education: Not on file   Highest education level: Not on file  Occupational History   Not on file  Tobacco Use   Smoking status: Never   Smokeless tobacco: Never  Vaping Use   Vaping Use: Never used  Substance and Sexual Activity    Alcohol use: Yes    Alcohol/week: 6.0 standard drinks    Types: 6 Cans of beer per week   Drug use: No   Sexual activity: Yes  Other Topics Concern   Not on file  Social History Narrative   Lives with his wife - Has a son with cirrhosis who he stays with a lot. Has another son and one daughter    Social Determinants of Radio broadcast assistant Strain: Low Risk    Difficulty of Paying Living Expenses: Not hard at all  Food Insecurity: No Food Insecurity   Worried About Charity fundraiser in the Last Year: Never true   Arboriculturist in the Last Year: Never true  Transportation Needs: No Transportation Needs   Lack of Transportation (Medical): No   Lack of Transportation (Non-Medical): No  Physical Activity: Sufficiently Active   Days of Exercise per Week: 7 days   Minutes of Exercise per Session: 60 min  Stress: No Stress Concern Present   Feeling of Stress : Not at all  Social Connections: Moderately Isolated   Frequency of Communication with Friends and Family: More than three times a week   Frequency of Social Gatherings with Friends and Family: More than three times a week   Attends Religious Services: Never   Marine scientist or Organizations: No   Attends Archivist Meetings:  Never   Marital Status: Married    High Risk Criteria for Readmission and/or Poor Patient Outcomes: Heart failure hospital admissions (last 6 months): 1  No Show rate: 0% Difficult social situation: no Demonstrates medication adherence: yes Primary Language: English Literacy level: able to read/write and comprehend  Education Assessment and Provision:  Detailed education and instructions provided on heart failure disease management including the following:  Signs and symptoms of Heart Failure When to call the physician Importance of daily weights Low sodium diet Fluid restriction Medication management Anticipated future follow-up appointments  Patient education  given on each of the above topics.  Patient acknowledges understanding via teach back method and acceptance of all instructions.  Education Materials:  "Living Better With Heart Failure" Booklet, HF zone tool, & Daily Weight Tracker Tool.  Patient has scale at home: yes Patient has pill box at home: declined   Barriers of Care:   -none  Considerations/Referrals:   Referral made to Heart Failure Pharmacist Stewardship: no Referral made to Heart Failure CSW/NCM TOC: no Referral made to Heart & Vascular TOC clinic: pending. See above.   Items for Follow-up on DC/TOC: -optimize   Pricilla Holm, MSN, RN Heart Failure Nurse Navigator [phone]

## 2021-07-21 NOTE — Progress Notes (Addendum)
Progress Note  Patient Name: [name] Date of Encounter: 07/21/2021  Primary Cardiologist: Minus Breeding, MD  Subjective   SOB still not yet fully resolved. Feeling very "tired." Also reports continued episodic chest pains (last about 5-6 minutes), unprovoked, resolve spontaneously. Had episode last night and this AM around 10am. This is what prompted ED visit 8/29 as well. When asked about stools he admits he has noticed some darkening of the stools but is unable to quantify further. Vague historian.  Inpatient Medications    Scheduled Meds:  amLODipine  10 mg Oral Daily   aspirin EC  81 mg Oral Daily   atorvastatin  80 mg Oral Daily   carvedilol  25 mg Oral BID WC   cloNIDine  0.3 mg Oral BID   empagliflozin  10 mg Oral QAC breakfast   gabapentin  100 mg Oral BID   heparin  5,000 Units Subcutaneous Q8H   insulin aspart  0-5 Units Subcutaneous QHS   insulin aspart  0-9 Units Subcutaneous TID WC   insulin detemir  20 Units Subcutaneous QHS   potassium chloride  40 mEq Oral Once   sodium chloride flush  3 mL Intravenous Q12H   tamsulosin  0.4 mg Oral QPC supper   ticagrelor  90 mg Oral BID   Continuous Infusions:  sodium chloride     PRN Meds: sodium chloride, acetaminophen, ondansetron (ZOFRAN) IV, sodium chloride flush   Vital Signs    Vitals:   07/20/21 1601 07/20/21 2027 07/21/21 0404 07/21/21 0739  BP: (!) 149/73 (!) 146/69 (!) 148/67 133/63  Pulse: 62 64 61 65  Resp:  18 18   Temp: 99.5 F (37.5 C) 98.5 F (36.9 C) 98.4 F (36.9 C) 98.8 F (37.1 C)  TempSrc: Oral Oral Oral Oral  SpO2: 99% 95% 97% 98%  Weight:   91.8 kg   Height:        Intake/Output Summary (Last 24 hours) at 07/21/2021 1039 Last data filed at 07/21/2021 0831 Gross per 24 hour  Intake 440 ml  Output 1950 ml  Net -1510 ml   Last 3 Weights 07/21/2021 07/20/2021 07/19/2021  Weight (lbs) 202 lb 6.4 oz 203 lb 11.2 oz 207 lb 7.3 oz  Weight (kg) 91.808 kg 92.398 kg 94.1 kg      Telemetry    SB (mid 50s) to NSR - Personally Reviewed  Physical Exam   GEN: No acute distress.  HEENT: Normocephalic, atraumatic, sclera non-icteric. Neck: No JVD or bruits. Cardiac: RRR no murmurs, rubs, or gallops.  Respiratory: Clear to auscultation bilaterally. Breathing is unlabored. GI: Soft, nontender, rounded habitus, BS +x 4. MS: no deformity. Extremities: No clubbing or cyanosis. Trace sockline edema. Distal pedal pulses are 2+ and equal bilaterally. Neuro:  AAOx3. Follows commands. Psych:  Responds to questions appropriately with a normal affect.  Labs    High Sensitivity Troponin:   Recent Labs  Lab 07/13/21 1119 07/13/21 1319 07/19/21 1200 07/19/21 1349  TROPONINIHS 16 16 54* 62*      Cardiac EnzymesNo results for input(s): TROPONINI in the last 168 hours. No results for input(s): TROPIPOC in the last 168 hours.   Chemistry Recent Labs  Lab 07/19/21 1200 07/20/21 0126 07/21/21 0348  NA 137 139 137  K 3.9 2.8* 3.1*  CL 107 104 106  CO2 24 25 25   GLUCOSE 165* 127* 119*  BUN 23 20 20   CREATININE 1.85* 1.89* 1.89*  CALCIUM 8.4* 8.9 8.5*  PROT  --   --  5.7*  ALBUMIN  --   --  2.6*  AST  --   --  15  ALT  --   --  11  ALKPHOS  --   --  59  BILITOT  --   --  1.2  GFRNONAA 39* 38* 38*  ANIONGAP 6 10 6      Hematology Recent Labs  Lab 07/19/21 1200 07/21/21 0348  WBC 10.8* 8.4  RBC 3.29* 2.95*  3.03*  HGB 9.6* 8.5*  HCT 29.1* 25.2*  MCV 88.4 85.4  MCH 29.2 28.8  MCHC 33.0 33.7  RDW 14.2 13.9  PLT 219 235    BNP Recent Labs  Lab 07/19/21 1200  BNP 802.0*     DDimer No results for input(s): DDIMER in the last 168 hours.   Radiology    DG Chest Port 1 View  Result Date: 07/19/2021 CLINICAL DATA:  Shortness of breath over 6 days. EXAM: PORTABLE CHEST 1 VIEW COMPARISON:  Chest radiograph dated 07/13/2021. FINDINGS: The heart size and mediastinal contours are within normal limits. Mild diffuse bilateral interstitial and airspace  opacities are noted. There is a small left pleural effusion. There is no right pleural effusion. There is no pneumothorax. The visualized skeletal structures are unremarkable. IMPRESSION: Mild diffuse bilateral interstitial and airspace opacities may represent pulmonary edema or less likely pneumonia. Small left pleural effusion. Electronically Signed   By: Zerita Boers M.D.   On: 07/19/2021 12:10   ECHOCARDIOGRAM LIMITED  Result Date: 07/20/2021    ECHOCARDIOGRAM LIMITED REPORT   Patient Name:   [name] Date of Exam: 07/20/2021 Medical Rec #:  [id number]            Height:       72.0 in Accession #:    [id number]           Weight:       203.7 lb Date of Birth:  26-Nov-1955            BSA:          2.147 m Patient Age:    65 years             BP:           141/68 mmHg Patient Gender: M                    HR:           67 bpm. Exam Location:  Inpatient Procedure: Limited Echo Indications:     CHF-Acute Systolic L87.56  History:         Patient has prior history of Echocardiogram examinations, most                  recent 06/11/2021. Previous Myocardial Infarction and CAD; Risk                  Factors:Diabetes, Hypertension and Dyslipidemia.  Sonographer:     Merrie Roof RDCS Referring Phys:  [place] Diagnosing Phys: Kirk Ruths McleanMD IMPRESSIONS  1. Left ventricular ejection fraction, by estimation, is 55 to 60%. The left ventricle has normal function. The left ventricle has no regional wall motion abnormalities. There is mild left ventricular hypertrophy. Left ventricular diastolic parameters are indeterminate.  2. Right ventricular systolic function is normal. The right ventricular size is normal. Tricuspid regurgitation signal is inadequate for assessing PA pressure.  3. Left atrial size was mildly dilated.  4. The inferior vena cava is normal in size with  greater than 50% respiratory variability, suggesting right atrial pressure of 3 mmHg.  5. Limited echo with no color doppler. FINDINGS   Left Ventricle: Left ventricular ejection fraction, by estimation, is 55 to 60%. The left ventricle has normal function. The left ventricle has no regional wall motion abnormalities. The left ventricular internal cavity size was normal in size. There is  mild left ventricular hypertrophy. Left ventricular diastolic parameters are indeterminate. Right Ventricle: The right ventricular size is normal. No increase in right ventricular wall thickness. Right ventricular systolic function is normal. Tricuspid regurgitation signal is inadequate for assessing PA pressure. Left Atrium: Left atrial size was mildly dilated. Right Atrium: Right atrial size was normal in size. Pericardium: Trivial pericardial effusion is present. Aorta: The aortic root is normal in size and structure. Venous: The inferior vena cava is normal in size with greater than 50% respiratory variability, suggesting right atrial pressure of 3 mmHg. IAS/Shunts: No atrial level shunt detected by color flow Doppler. LEFT VENTRICLE PLAX 2D LVIDd:         5.00 cm LV PW:         1.30 cm LV IVS:        1.30 cm   AORTA Ao Root diam: 2.90 cm Dalton McleanMD Electronically signed by Franki Monte Signature Date/Time: 07/20/2021/11:56:02 AM    Final (Updated)     Cardiac Studies  2D echo 07/20/21 - limited   1. Left ventricular ejection fraction, by estimation, is 55 to 60%. The  left ventricle has normal function. The left ventricle has no regional  wall motion abnormalities. There is mild left ventricular hypertrophy.  Left ventricular diastolic parameters  are indeterminate.   2. Right ventricular systolic function is normal. The right ventricular  size is normal. Tricuspid regurgitation signal is inadequate for assessing  PA pressure.   3. Left atrial size was mildly dilated.   4. The inferior vena cava is normal in size with greater than 50%  respiratory variability, suggesting right atrial pressure of 3 mmHg.   5. Limited echo with no color  doppler.   Patient Profile     65 y.o. male with CAD with recent NSTEMI 05/2021 with staged PCI/DES to LCx/OM1 bifurcation (other disease treated medically), CKD stage IIIb, chronic appearing anemia, DM, HTN, HLD . Recently seen in ED 07/13/21 with atypical chest pain and ruled out - at that time Dr. Harl Bowie suggested to increase amlodipine to 10mg  daily but do not see this occurred. Returned to hospital yesterday with worsening DOE, orthopnea, and HTN. He was admitted for diastolic CHF.  Assessment & Plan    1. Acute diastolic CHF - inciting factor not clear, possibly poorly controlled HTN +/- ?contribution of hypoalbuminemia in context of CKD - diuresed well with IV Lasix given day of admission - BP meds titrated yesterday (amlodipine increased from 7.5mg  @ home to 10mg  daily, carvedilol increased from 12.5mg  BID to 25mg  BID) - volume status looks improved - consideration of ACEI/ARB as OP once not actively diuresing - also sees nephrology so would involve them in decision making - f/u labs in AM   2. CAD s/p recent NSTEMI/PCI 05/2021 - troponins low/flat 54->62, not suggestive of ACS - suspect demand ischemia process although still reporting episodic chest pains - will discuss with MD - continue ASA, Brilinta, BB, statin - it was considered whether there was component of Brilinta causing his symptoms but given objective evidence of fluid overload, diastolic HF seems more likely   3. Acute on chronic -  iron indices with somewhat mixed picture - decreased iron/% saturation but elevated TIBC - this has been persistent since around 04/2020 - Hgb had been in 9-10 range but decreased to 8.5 today - will review further eval with MD as patient also reporting darker stools, general fatigue   4 Hypokalemia - will give another 41meq x 2 doses today - add Mg to labs   5. CKD stage 3b - baseline Cr appears around this 1.8 range, stable, follow  6. Essential HTN - improved with med titration -  follow HR on telemetry   7. Hyperlipidemia - continue statin - LDL at goal at 61   8. DM - continue Levemir, SSI, Jardiance  For questions or updates, please contact [facility] Please consult [email or web address] for contact info under Cardiology/STEMI.  Signed, Charlie Pitter, PA-C 07/21/2021, 10:39 AM   As above, patient seen and examined.  Patient states his dyspnea is not at baseline.  He also continues to have intermittent chest pain that is in the left chest area and he states similar to his previous infarct pain.  It resolves after approximately 5 minutes.  We will give Lasix 40 mg IV x1 today.  Follow renal function closely.  Add isosorbide 60 mg daily.  If chest pain persists he may need relook cardiac catheterization understanding that he would be at risk for contrast nephropathy.  However it should also be noted that he has distal disease that was not amenable to PCI or coronary artery bypass and graft.  We will reassess tomorrow morning.  Anemia is chronic and likely there is a component of renal insufficiency contributing. Kirk Ruths, MD

## 2021-07-22 ENCOUNTER — Telehealth: Payer: PPO

## 2021-07-22 LAB — BASIC METABOLIC PANEL
Anion gap: 6 (ref 5–15)
BUN: 19 mg/dL (ref 8–23)
CO2: 26 mmol/L (ref 22–32)
Calcium: 8.4 mg/dL — ABNORMAL LOW (ref 8.9–10.3)
Chloride: 106 mmol/L (ref 98–111)
Creatinine, Ser: 2.01 mg/dL — ABNORMAL HIGH (ref 0.61–1.24)
GFR, Estimated: 36 mL/min — ABNORMAL LOW (ref >60)
Glucose, Bld: 148 mg/dL — ABNORMAL HIGH (ref 70–99)
Potassium: 3.7 mmol/L (ref 3.5–5.1)
Sodium: 138 mmol/L (ref 135–145)

## 2021-07-22 LAB — CBC
HCT: 23.8 % — ABNORMAL LOW (ref 39.0–52.0)
Hemoglobin: 7.9 g/dL — ABNORMAL LOW (ref 13.0–17.0)
MCH: 28.4 pg (ref 26.0–34.0)
MCHC: 33.2 g/dL (ref 30.0–36.0)
MCV: 85.6 fL (ref 80.0–100.0)
Platelets: 251 10*3/uL (ref 150–400)
RBC: 2.78 MIL/uL — ABNORMAL LOW (ref 4.22–5.81)
RDW: 13.8 % (ref 11.5–15.5)
WBC: 8.3 10*3/uL (ref 4.0–10.5)
nRBC: 0 % (ref 0.0–0.2)

## 2021-07-22 LAB — GLUCOSE, CAPILLARY
Glucose-Capillary: 114 mg/dL — ABNORMAL HIGH (ref 70–99)
Glucose-Capillary: 172 mg/dL — ABNORMAL HIGH (ref 70–99)
Glucose-Capillary: 177 mg/dL — ABNORMAL HIGH (ref 70–99)
Glucose-Capillary: 168 mg/dL — ABNORMAL HIGH (ref 70–99)

## 2021-07-22 LAB — OCCULT BLOOD X 1 CARD TO LAB, STOOL: Fecal Occult Bld: NEGATIVE

## 2021-07-22 MED ORDER — FERROUS SULFATE 325 (65 FE) MG PO TABS
325.0000 mg | ORAL_TABLET | Freq: Two times a day (BID) | ORAL | Status: DC
  Administered 2021-07-22 – 2021-07-24 (×4): 325 mg via ORAL
  Filled 2021-07-22 (×4): qty 1

## 2021-07-22 MED ORDER — SODIUM CHLORIDE 0.9% FLUSH: 3.0000 mL | INTRAVENOUS | Status: DC | PRN

## 2021-07-22 MED ORDER — SODIUM CHLORIDE 0.9% FLUSH
3.0000 mL | Freq: Two times a day (BID) | INTRAVENOUS | Status: DC
  Administered 2021-07-22: 3 mL via INTRAVENOUS

## 2021-07-22 NOTE — Progress Notes (Signed)
Patient is alert and oriented x 4 and has read and signed the informed consent. Patient verbalize understanding and has no questions for RN to address. RN applied second PIV for procedure with the patient consent. Patient tolerated well and site is clean dry and intact.

## 2021-07-22 NOTE — Progress Notes (Addendum)
  Shared Decision Making/Informed Consent  The risks [stroke (1 in 1000), death (1 in 1000), kidney failure [usually temporary] (1 in 500), bleeding (1 in 200), allergic reaction [possibly serious] (1 in 200)], benefits (diagnostic support and management of coronary artery disease) and alternatives of a cardiac catheterization were discussed in detail with [name] and he is willing to proceed.  NPO after midnight, cardiac cath is scheduled for tomorrow.  Pre-cath orders placed. Staff RN notified. Diuretics on hold for CHF,  repeat BMP tomorrow to reassess renal function.  Consider lowest contrast load if possible as patient is for contrast-induced nephropathy.

## 2021-07-22 NOTE — Plan of Care (Signed)
  Problem: Education: Goal: Ability to demonstrate management of disease process will improve Outcome: Progressing Goal: Ability to verbalize understanding of medication therapies will improve Outcome: Progressing   Problem: Cardiac: Goal: Ability to achieve and maintain adequate cardiopulmonary perfusion will improve Outcome: Progressing   

## 2021-07-22 NOTE — Progress Notes (Deleted)
Cardiology Office Note  Date: 07/22/2021   ID: Victormanuel, Mclure [birth year], MRN [id number]  PCP:  [name], DO  Cardiologist:  Minus Breeding, MD Electrophysiologist:  None   Chief Complaint: Hospital follow up CHF/ Recent NSTEMI.  History of Present Illness: [name] is a 65 y.o. male with a history of CAD/NSTEMI, CKD 3, HTN, HLD, Anemia, Acute diastolic HF, DM2.   CAD with NSTEMI 05/2021 with stage PCI/DES to LCX/OM1 bifurcation (other disease treated medically).   Seen in ED on 07/13/2021 with atypical CP and was ruled out for ACS. Dr Harl Bowie had suggested increasing Amlodipine to 10 mg po daily but apparently this was not done per provider notes.  He presented to hospital on 07/19/2021 with increasing DOE, orthopnea and hypertension. He was admitted for acute diastolic HF.  Past Medical History:  Diagnosis Date   Chronic kidney disease    Complication of anesthesia    Hard to wake   Degenerative joint disease (DJD) of lumbar spine    Diabetes mellitus without complication ([place])    Type II   Essential hypertension 02/23/2017   Lumbar stenosis    NSTEMI (non-ST elevated myocardial infarction) ([place]) 06/07/2021    Past Surgical History:  Procedure Laterality Date   AMPUTATION TOE Right 05/17/2020   Procedure: AMPUTATION TOE partial right great toe;  Surgeon: Evelina Bucy, DPM;  Location: [facility];  Service: Podiatry;  Laterality: Right;   BELPHAROPTOSIS REPAIR     CARDIAC CATHETERIZATION     CORONARY STENT INTERVENTION N/A 06/11/2021   Procedure: CORONARY STENT INTERVENTION;  Surgeon: Martinique, Peter M, MD;  Location: [place] [facility];  Service: Cardiovascular;  Laterality: N/A;   EYE SURGERY     HEMORRHOID SURGERY     INTRAVASCULAR ULTRASOUND/IVUS N/A 06/11/2021   Procedure: Intravascular Ultrasound/IVUS;  Surgeon: Martinique, Peter M, MD;  Location: [place] [facility];  Service: Cardiovascular;  Laterality: N/A;   LEFT HEART CATH AND CORONARY  ANGIOGRAPHY N/A 06/09/2021   Procedure: LEFT HEART CATH AND CORONARY ANGIOGRAPHY;  Surgeon: Martinique, Peter M, MD;  Location: [facility];  Service: Cardiovascular;  Laterality: N/A;   skin cancer removed right ear     TRANSFORAMINAL LUMBAR INTERBODY FUSION (TLIF) WITH PEDICLE SCREW FIXATION 2 LEVEL N/A 07/06/2018   Procedure: TRANSFORAMINAL LUMBAR INTERBODY FUSION (TLIF) L4-S1;  Surgeon: Melina Schools, MD;  Location: [facility];  Service: Orthopedics;  Laterality: N/A;    No current facility-administered medications for this visit.   No current outpatient medications on file.   Facility-Administered Medications Ordered in Other Visits  Medication Dose Route Frequency Provider Last Rate Last Admin   0.9 %  sodium chloride infusion  250 mL Intravenous PRN Doyne Keel., MD       acetaminophen (TYLENOL) tablet 650 mg  650 mg Oral Q4H PRN Doyne Keel., MD       amLODipine (NORVASC) tablet 10 mg  10 mg Oral Daily Dunn, Dayna N, PA-C   10 mg at 07/22/21 1013   aspirin EC tablet 81 mg  81 mg Oral Daily Doyne Keel., MD   81 mg at 07/22/21 0823   atorvastatin (LIPITOR) tablet 80 mg  80 mg Oral Daily Doyne Keel., MD   80 mg at 07/22/21 1013   carvedilol (COREG) tablet 12.5 mg  12.5 mg Oral BID WC Dunn, Dayna N, PA-C   12.5 mg at 07/22/21 1633   cloNIDine (CATAPRES) tablet 0.3 mg  0.3 mg  Oral BID Doyne Keel., MD   0.3 mg at 07/22/21 1013   empagliflozin (JARDIANCE) tablet 10 mg  10 mg Oral QAC breakfast Doyne Keel., MD   10 mg at 07/22/21 0631   ferrous sulfate tablet 325 mg  325 mg Oral BID WC Lelon Perla, MD   325 mg at 07/22/21 1633   gabapentin (NEURONTIN) capsule 100 mg  100 mg Oral BID Doyne Keel., MD   100 mg at 07/22/21 1013   heparin injection 5,000 Units  5,000 Units Subcutaneous Q8H Doyne Keel., MD   5,000 Units at 07/22/21 1634   insulin aspart (novoLOG) injection 0-5 Units  0-5 Units Subcutaneous QHS Doyne Keel., MD       insulin aspart (novoLOG) injection 0-9 Units  0-9 Units Subcutaneous TID WC Doyne Keel., MD   2 Units at 07/22/21 1635   insulin detemir (LEVEMIR) injection 20 Units  20 Units Subcutaneous QHS Doyne Keel., MD   20 Units at 07/21/21 2134   isosorbide mononitrate (IMDUR) 24 hr tablet 60 mg  60 mg Oral Daily Lelon Perla, MD   60 mg at 07/22/21 1013   ondansetron (ZOFRAN) injection 4 mg  4 mg Intravenous Q6H PRN Doyne Keel., MD       sodium chloride flush (NS) 0.9 % injection 3 mL  3 mL Intravenous Q12H Doyne Keel., MD   3 mL at 07/22/21 1014   sodium chloride flush (NS) 0.9 % injection 3 mL  3 mL Intravenous PRN Doyne Keel., MD       sodium chloride flush (NS) 0.9 % injection 3 mL  3 mL Intravenous Q12H Margie Billet, NP       sodium chloride flush (NS) 0.9 % injection 3 mL  3 mL Intravenous PRN Margie Billet, NP       tamsulosin (FLOMAX) capsule 0.4 mg  0.4 mg Oral QPC supper Doyne Keel., MD   0.4 mg at 07/22/21 1633   ticagrelor (BRILINTA) tablet 90 mg  90 mg Oral BID Doyne Keel., MD   90 mg at 07/22/21 1013   Allergies:  Patient has no known allergies.   Social History: The patient  reports that he has never smoked. He has never used smokeless tobacco. He reports current alcohol use of about 6.0 standard drinks per week. He reports that he does not use drugs.   Family History: The patient's family history includes Cancer in his brother; Diabetes in his brother; Heart attack (age of onset: 13) in his father; Hypertension in his father; Vision loss in his sister.   ROS:  Please see the history of present illness. Otherwise, complete review of systems is positive for {NONE DEFAULTED:18576}.  All other systems are reviewed and negative.   Physical Exam: VS:  There were no vitals taken for this visit., BMI There is no height or weight on file to calculate BMI.  Wt Readings from Last 3 Encounters:  07/22/21 199 lb 11.2 oz  (90.6 kg)  07/03/21 201 lb 3.2 oz (91.3 kg)  07/02/21 202 lb (91.6 kg)    General: Patient appears comfortable at rest. HEENT: Conjunctiva and lids normal, oropharynx clear with moist mucosa. Neck: Supple, no elevated JVP or carotid bruits, no thyromegaly. Lungs: Clear to auscultation, nonlabored breathing at rest. Cardiac: Regular rate and rhythm, no S3 or significant systolic murmur, no pericardial rub. Abdomen: Soft, nontender, no hepatomegaly, bowel sounds present, no guarding or rebound. Extremities:  No pitting edema, distal pulses 2+. Skin: Warm and dry. Musculoskeletal: No kyphosis. Neuropsychiatric: Alert and oriented x3, affect grossly appropriate.  ECG:  {EKG/Telemetry Strips Reviewed:[phone]}  Recent Labwork: 07/19/2021: B Natriuretic Peptide 802.0 07/21/2021: ALT 11; AST 15; Magnesium 2.0 07/22/2021: BUN 19; Creatinine, Ser 2.01; Hemoglobin 7.9; Platelets 251; Potassium 3.7; Sodium 138     Component Value Date/Time   CHOL 110 07/21/2021 0348   CHOL 138 10/01/2019 1133   TRIG 120 07/21/2021 0348   HDL 25 (L) 07/21/2021 0348   HDL 37 (L) 10/01/2019 1133   CHOLHDL 4.4 07/21/2021 0348   VLDL 24 07/21/2021 0348   LDLCALC 61 07/21/2021 0348   LDLCALC 71 10/01/2019 1133    Other Studies Reviewed Today:  2D echo 08/14/21 - limited   1. Left ventricular ejection fraction, by estimation, is 55 to 60%. The  left ventricle has normal function. The left ventricle has no regional  wall motion abnormalities. There is mild left ventricular hypertrophy.  Left ventricular diastolic parameters  are indeterminate.   2. Right ventricular systolic function is normal. The right ventricular  size is normal. Tricuspid regurgitation signal is inadequate for assessing  PA pressure.   3. Left atrial size was mildly dilated.   4. The inferior vena cava is normal in size with greater than 50%  respiratory variability, suggesting right atrial pressure of 3 mmHg.   5. Limited echo with no  color doppler.   Echocardiogram 06/08/2021  1. Left ventricular ejection fraction, by estimation, is 60 to 65%. The left ventricle has normal function. The left ventricle has no regional wall motion abnormalities. There is mild left ventricular hypertrophy. Left ventricular diastolic parameters are consistent with Grade I diastolic dysfunction (impaired relaxation). 2. Right ventricular systolic function is normal. The right ventricular size is normal. There is normal pulmonary artery systolic pressure. The estimated right ventricular systolic pressure is 59.1 mmHg. 3. The mitral valve is abnormal. Trivial mitral valve regurgitation. Mild mitral stenosis. The mean mitral valve gradient is 6.0 mmHg. Moderate mitral annular calcification. 4. The aortic valve is tricuspid. Aortic valve regurgitation is not visualized. Mild aortic valve sclerosis is present, with no evidence of aortic valve stenosis. 5. The inferior vena cava is normal in size with greater than 50% respiratory variability, suggesting right atrial pressure of 3 mmHg.   Cardiac catheterization / PCI 06/11/2021 CORONARY STENT INTERVENTION  Intravascular Ultrasound/IVUS   Conclusion      1st Mrg lesion is 95% stenosed.   A drug-eluting stent was successfully placed using a STENT ONYX FRONTIER 2.75X26.   Post intervention, there is a 0% residual stenosis.   Ost Cx to Prox Cx lesion is 65% stenosed.   A drug-eluting stent was successfully placed using a STENT ONYX FRONTIER 3.0X22.   Post intervention, there is a 0% residual stenosis.   Successful Bifurcation stenting of the proximal to mid LCx and first OM using IVUS guidance and [place] technique with DES x 2   Plan: DAPT for one year. Aggressive risk factor modification. Will treat his other disease medically. Diagnostic Dominance: Right Intervention      Assessment and Plan:  1. Acute diastolic HF (heart failure) ([place])   2. CAD in native artery   3. Stage 3b  chronic kidney disease ([place])   4. Essential hypertension   5. Hyperlipidemia LDL goal <70   6. Chest pain, unspecified type      Medication Adjustments/Labs and Tests Ordered: Current medicines are reviewed at length with the patient today.  Concerns regarding medicines are outlined above.   Disposition: Follow-up with ***  Signed, Levell July, NP 07/22/2021 8:41 PM    [facility] at [facility] [place], [place], [place] [address] Phone: [phone]; Fax: [phone]

## 2021-07-22 NOTE — H&P (View-Only) (Signed)
Progress Note  Patient Name: [name] Date of Encounter: 07/22/2021  [facility] Cardiologist: Minus Breeding, MD   Subjective   No CP or dyspnea  Inpatient Medications    Scheduled Meds:  amLODipine  10 mg Oral Daily   aspirin EC  81 mg Oral Daily   atorvastatin  80 mg Oral Daily   carvedilol  12.5 mg Oral BID WC   cloNIDine  0.3 mg Oral BID   empagliflozin  10 mg Oral QAC breakfast   gabapentin  100 mg Oral BID   heparin  5,000 Units Subcutaneous Q8H   insulin aspart  0-5 Units Subcutaneous QHS   insulin aspart  0-9 Units Subcutaneous TID WC   insulin detemir  20 Units Subcutaneous QHS   isosorbide mononitrate  60 mg Oral Daily   sodium chloride flush  3 mL Intravenous Q12H   tamsulosin  0.4 mg Oral QPC supper   ticagrelor  90 mg Oral BID   Continuous Infusions:  sodium chloride     PRN Meds: sodium chloride, acetaminophen, ondansetron (ZOFRAN) IV, sodium chloride flush   Vital Signs    Vitals:   07/22/21 0342 07/22/21 0758 07/22/21 0822 07/22/21 1013  BP: 138/67 124/70 (!) 124/59 131/67  Pulse: 66 63 63   Resp: 18 16    Temp: 98.3 F (36.8 C) 98.6 F (37 C)    TempSrc: Oral Oral    SpO2: 97% 100%    Weight: 90.6 kg     Height:        Intake/Output Summary (Last 24 hours) at 07/22/2021 1023 Last data filed at 07/22/2021 0700 Gross per 24 hour  Intake 960 ml  Output 2225 ml  Net -1265 ml   Last 3 Weights 07/22/2021 07/21/2021 07/20/2021  Weight (lbs) 199 lb 11.2 oz 202 lb 6.4 oz 203 lb 11.2 oz  Weight (kg) 90.583 kg 91.808 kg 92.398 kg      Telemetry    Sinus - Personally Reviewed  Physical Exam   GEN: No acute distress.   Neck: No JVD Cardiac: RRR, no murmurs, rubs, or gallops.  Respiratory: Clear to auscultation bilaterally. GI: Soft, nontender, non-distended  MS: No edema Neuro:  Nonfocal  Psych: Normal affect   Labs    High Sensitivity Troponin:   Recent Labs  Lab 07/13/21 1119 07/13/21 1319 07/19/21 1200 07/19/21 1349   TROPONINIHS 16 16 54* 62*      Chemistry Recent Labs  Lab 07/20/21 0126 07/21/21 0348 07/22/21 0117  NA 139 137 138  K 2.8* 3.1* 3.7  CL 104 106 106  CO2 25 25 26   GLUCOSE 127* 119* 148*  BUN 20 20 19   CREATININE 1.89* 1.89* 2.01*  CALCIUM 8.9 8.5* 8.4*  PROT  --  5.7*  --   ALBUMIN  --  2.6*  --   AST  --  15  --   ALT  --  11  --   ALKPHOS  --  59  --   BILITOT  --  1.2  --   GFRNONAA 38* 38* 36*  ANIONGAP 10 6 6      Hematology Recent Labs  Lab 07/19/21 1200 07/21/21 0348 07/22/21 0117  WBC 10.8* 8.4 8.3  RBC 3.29* 2.95*  3.03* 2.78*  HGB 9.6* 8.5* 7.9*  HCT 29.1* 25.2* 23.8*  MCV 88.4 85.4 85.6  MCH 29.2 28.8 28.4  MCHC 33.0 33.7 33.2  RDW 14.2 13.9 13.8  PLT 219 235 251    BNP Recent  Labs  Lab 07/19/21 1200  BNP 802.0*     Radiology    ECHOCARDIOGRAM LIMITED  Result Date: 07/20/2021    ECHOCARDIOGRAM LIMITED REPORT   Patient Name:   [name] Date of Exam: 07/20/2021 Medical Rec #:  [id number]            Height:       72.0 in Accession #:    [id number]           Weight:       203.7 lb Date of Birth:  May 21, 1956            BSA:          2.147 m Patient Age:    65 years             BP:           141/68 mmHg Patient Gender: M                    HR:           67 bpm. Exam Location:  Inpatient Procedure: Limited Echo Indications:     CHF-Acute Systolic V76.16  History:         Patient has prior history of Echocardiogram examinations, most                  recent 06/11/2021. Previous Myocardial Infarction and CAD; Risk                  Factors:Diabetes, Hypertension and Dyslipidemia.  Sonographer:     Merrie Roof RDCS Referring Phys:  [place] Diagnosing Phys: Kirk Ruths McleanMD IMPRESSIONS  1. Left ventricular ejection fraction, by estimation, is 55 to 60%. The left ventricle has normal function. The left ventricle has no regional wall motion abnormalities. There is mild left ventricular hypertrophy. Left ventricular diastolic parameters are  indeterminate.  2. Right ventricular systolic function is normal. The right ventricular size is normal. Tricuspid regurgitation signal is inadequate for assessing PA pressure.  3. Left atrial size was mildly dilated.  4. The inferior vena cava is normal in size with greater than 50% respiratory variability, suggesting right atrial pressure of 3 mmHg.  5. Limited echo with no color doppler. FINDINGS  Left Ventricle: Left ventricular ejection fraction, by estimation, is 55 to 60%. The left ventricle has normal function. The left ventricle has no regional wall motion abnormalities. The left ventricular internal cavity size was normal in size. There is  mild left ventricular hypertrophy. Left ventricular diastolic parameters are indeterminate. Right Ventricle: The right ventricular size is normal. No increase in right ventricular wall thickness. Right ventricular systolic function is normal. Tricuspid regurgitation signal is inadequate for assessing PA pressure. Left Atrium: Left atrial size was mildly dilated. Right Atrium: Right atrial size was normal in size. Pericardium: Trivial pericardial effusion is present. Aorta: The aortic root is normal in size and structure. Venous: The inferior vena cava is normal in size with greater than 50% respiratory variability, suggesting right atrial pressure of 3 mmHg. IAS/Shunts: No atrial level shunt detected by color flow Doppler. LEFT VENTRICLE PLAX 2D LVIDd:         5.00 cm LV PW:         1.30 cm LV IVS:        1.30 cm   AORTA Ao Root diam: 2.90 cm Dalton McleanMD Electronically signed by Franki Monte Signature Date/Time: 07/20/2021/11:56:02 AM    Final (Updated)  Patient Profile     65 y.o. male with CAD with recent NSTEMI 05/2021 with staged PCI/DES to LCx/OM1 bifurcation (other disease treated medically), CKD stage IIIb, chronic appearing anemia, DM, HTN, HLD . Recently seen in ED 07/13/21 with atypical chest pain and ruled out - at that time Dr. Harl Bowie suggested to  increase amlodipine to 10mg  daily but do not see this occurred. Returned to hospital with worsening DOE, orthopnea, and HTN. He was admitted for diastolic CHF.  Assessment & Plan    1 chest pain-patient continues to have intermittent chest pain that he states is similar to his cardiac pain.  However his symptoms are somewhat atypical.  Enzymes not suggestive of acute coronary syndrome.  Previous catheterization did reveal distal disease not amenable to PCI or coronary artery bypass graft.  I have advanced his antianginals including adding isosorbide and he is also on carvedilol and amlodipine.  I will review his films with Dr. Martinique.  He may require repeat catheterization given persistence of symptoms.  2 acute diastolic congestive heart failure-improved compared to admission.  We will hold on further diuresis given potential for catheterization and baseline renal insufficiency.  3 chronic stage IIIb kidney disease-follow renal function closely.  He would be at risk for contrast nephropathy with repeat catheterization.  4 coronary artery disease status post recent PCI-continue aspirin, Brilinta and statin.  5 hypertension-blood pressure controlled.  Continue present medications and follow-up.  6 hyperlipidemia-continue statin.  7 anemia-possible contribution from renal insufficiency.  Add iron.  For questions or updates, please contact [place] Please consult [email or web address] for contact info under        Signed, Kirk Ruths, MD  07/22/2021, 10:23 AM

## 2021-07-22 NOTE — Progress Notes (Signed)
Progress Note  Patient Name: [name] Date of Encounter: 07/22/2021  [facility] Cardiologist: Minus Breeding, MD   Subjective   No CP or dyspnea  Inpatient Medications    Scheduled Meds:  amLODipine  10 mg Oral Daily   aspirin EC  81 mg Oral Daily   atorvastatin  80 mg Oral Daily   carvedilol  12.5 mg Oral BID WC   cloNIDine  0.3 mg Oral BID   empagliflozin  10 mg Oral QAC breakfast   gabapentin  100 mg Oral BID   heparin  5,000 Units Subcutaneous Q8H   insulin aspart  0-5 Units Subcutaneous QHS   insulin aspart  0-9 Units Subcutaneous TID WC   insulin detemir  20 Units Subcutaneous QHS   isosorbide mononitrate  60 mg Oral Daily   sodium chloride flush  3 mL Intravenous Q12H   tamsulosin  0.4 mg Oral QPC supper   ticagrelor  90 mg Oral BID   Continuous Infusions:  sodium chloride     PRN Meds: sodium chloride, acetaminophen, ondansetron (ZOFRAN) IV, sodium chloride flush   Vital Signs    Vitals:   07/22/21 0342 07/22/21 0758 07/22/21 0822 07/22/21 1013  BP: 138/67 124/70 (!) 124/59 131/67  Pulse: 66 63 63   Resp: 18 16    Temp: 98.3 F (36.8 C) 98.6 F (37 C)    TempSrc: Oral Oral    SpO2: 97% 100%    Weight: 90.6 kg     Height:        Intake/Output Summary (Last 24 hours) at 07/22/2021 1023 Last data filed at 07/22/2021 0700 Gross per 24 hour  Intake 960 ml  Output 2225 ml  Net -1265 ml   Last 3 Weights 07/22/2021 07/21/2021 07/20/2021  Weight (lbs) 199 lb 11.2 oz 202 lb 6.4 oz 203 lb 11.2 oz  Weight (kg) 90.583 kg 91.808 kg 92.398 kg      Telemetry    Sinus - Personally Reviewed  Physical Exam   GEN: No acute distress.   Neck: No JVD Cardiac: RRR, no murmurs, rubs, or gallops.  Respiratory: Clear to auscultation bilaterally. GI: Soft, nontender, non-distended  MS: No edema Neuro:  Nonfocal  Psych: Normal affect   Labs    High Sensitivity Troponin:   Recent Labs  Lab 07/13/21 1119 07/13/21 1319 07/19/21 1200 07/19/21 1349   TROPONINIHS 16 16 54* 62*      Chemistry Recent Labs  Lab 07/20/21 0126 07/21/21 0348 07/22/21 0117  NA 139 137 138  K 2.8* 3.1* 3.7  CL 104 106 106  CO2 25 25 26   GLUCOSE 127* 119* 148*  BUN 20 20 19   CREATININE 1.89* 1.89* 2.01*  CALCIUM 8.9 8.5* 8.4*  PROT  --  5.7*  --   ALBUMIN  --  2.6*  --   AST  --  15  --   ALT  --  11  --   ALKPHOS  --  59  --   BILITOT  --  1.2  --   GFRNONAA 38* 38* 36*  ANIONGAP 10 6 6      Hematology Recent Labs  Lab 07/19/21 1200 07/21/21 0348 07/22/21 0117  WBC 10.8* 8.4 8.3  RBC 3.29* 2.95*  3.03* 2.78*  HGB 9.6* 8.5* 7.9*  HCT 29.1* 25.2* 23.8*  MCV 88.4 85.4 85.6  MCH 29.2 28.8 28.4  MCHC 33.0 33.7 33.2  RDW 14.2 13.9 13.8  PLT 219 235 251    BNP Recent  Labs  Lab 07/19/21 1200  BNP 802.0*     Radiology    ECHOCARDIOGRAM LIMITED  Result Date: 07/20/2021    ECHOCARDIOGRAM LIMITED REPORT   Patient Name:   [name] Date of Exam: 07/20/2021 Medical Rec #:  [id number]            Height:       72.0 in Accession #:    [id number]           Weight:       203.7 lb Date of Birth:  January 12, 1956            BSA:          2.147 m Patient Age:    41 years             BP:           141/68 mmHg Patient Gender: M                    HR:           67 bpm. Exam Location:  Inpatient Procedure: Limited Echo Indications:     CHF-Acute Systolic M38.46  History:         Patient has prior history of Echocardiogram examinations, most                  recent 06/11/2021. Previous Myocardial Infarction and CAD; Risk                  Factors:Diabetes, Hypertension and Dyslipidemia.  Sonographer:     Merrie Roof RDCS Referring Phys:  [place] Diagnosing Phys: Kirk Ruths McleanMD IMPRESSIONS  1. Left ventricular ejection fraction, by estimation, is 55 to 60%. The left ventricle has normal function. The left ventricle has no regional wall motion abnormalities. There is mild left ventricular hypertrophy. Left ventricular diastolic parameters are  indeterminate.  2. Right ventricular systolic function is normal. The right ventricular size is normal. Tricuspid regurgitation signal is inadequate for assessing PA pressure.  3. Left atrial size was mildly dilated.  4. The inferior vena cava is normal in size with greater than 50% respiratory variability, suggesting right atrial pressure of 3 mmHg.  5. Limited echo with no color doppler. FINDINGS  Left Ventricle: Left ventricular ejection fraction, by estimation, is 55 to 60%. The left ventricle has normal function. The left ventricle has no regional wall motion abnormalities. The left ventricular internal cavity size was normal in size. There is  mild left ventricular hypertrophy. Left ventricular diastolic parameters are indeterminate. Right Ventricle: The right ventricular size is normal. No increase in right ventricular wall thickness. Right ventricular systolic function is normal. Tricuspid regurgitation signal is inadequate for assessing PA pressure. Left Atrium: Left atrial size was mildly dilated. Right Atrium: Right atrial size was normal in size. Pericardium: Trivial pericardial effusion is present. Aorta: The aortic root is normal in size and structure. Venous: The inferior vena cava is normal in size with greater than 50% respiratory variability, suggesting right atrial pressure of 3 mmHg. IAS/Shunts: No atrial level shunt detected by color flow Doppler. LEFT VENTRICLE PLAX 2D LVIDd:         5.00 cm LV PW:         1.30 cm LV IVS:        1.30 cm   AORTA Ao Root diam: 2.90 cm Dalton McleanMD Electronically signed by Franki Monte Signature Date/Time: 07/20/2021/11:56:02 AM    Final (Updated)  Patient Profile     65 y.o. male with CAD with recent NSTEMI 05/2021 with staged PCI/DES to LCx/OM1 bifurcation (other disease treated medically), CKD stage IIIb, chronic appearing anemia, DM, HTN, HLD . Recently seen in ED 07/13/21 with atypical chest pain and ruled out - at that time Dr. Harl Bowie suggested to  increase amlodipine to 10mg  daily but do not see this occurred. Returned to hospital with worsening DOE, orthopnea, and HTN. He was admitted for diastolic CHF.  Assessment & Plan    1 chest pain-patient continues to have intermittent chest pain that he states is similar to his cardiac pain.  However his symptoms are somewhat atypical.  Enzymes not suggestive of acute coronary syndrome.  Previous catheterization did reveal distal disease not amenable to PCI or coronary artery bypass graft.  I have advanced his antianginals including adding isosorbide and he is also on carvedilol and amlodipine.  I will review his films with Dr. [place].  He may require repeat catheterization given persistence of symptoms.  2 acute diastolic congestive heart failure-improved compared to admission.  We will hold on further diuresis given potential for catheterization and baseline renal insufficiency.  3 chronic stage IIIb kidney disease-follow renal function closely.  He would be at risk for contrast nephropathy with repeat catheterization.  4 coronary artery disease status post recent PCI-continue aspirin, Brilinta and statin.  5 hypertension-blood pressure controlled.  Continue present medications and follow-up.  6 hyperlipidemia-continue statin.  7 anemia-possible contribution from renal insufficiency.  Add iron.  For questions or updates, please contact [place] Please consult [email or web address] for contact info under        Signed, Kirk Ruths, MD  07/22/2021, 10:23 AM

## 2021-07-23 ENCOUNTER — Encounter (HOSPITAL_COMMUNITY): Payer: Self-pay | Admitting: Cardiovascular Disease

## 2021-07-23 ENCOUNTER — Encounter (HOSPITAL_COMMUNITY)
Admission: EM | Disposition: A | Discharge status: Home / Self Care | Payer: Self-pay | Source: Home / Self Care | Attending: Cardiovascular Disease

## 2021-07-23 DIAGNOSIS — I2510 Atherosclerotic heart disease of native coronary artery without angina pectoris: Secondary | ICD-10-CM

## 2021-07-23 DIAGNOSIS — I25119 Atherosclerotic heart disease of native coronary artery with unspecified angina pectoris: Secondary | ICD-10-CM

## 2021-07-23 HISTORY — PX: LEFT HEART CATH AND CORONARY ANGIOGRAPHY: CATH118249

## 2021-07-23 LAB — BASIC METABOLIC PANEL
Anion gap: 8 (ref 5–15)
BUN: 18 mg/dL (ref 8–23)
CO2: 23 mmol/L (ref 22–32)
Calcium: 8.9 mg/dL (ref 8.9–10.3)
Chloride: 105 mmol/L (ref 98–111)
Creatinine, Ser: 2.03 mg/dL — ABNORMAL HIGH (ref 0.61–1.24)
GFR, Estimated: 35 mL/min — ABNORMAL LOW (ref >60)
Glucose, Bld: 182 mg/dL — ABNORMAL HIGH (ref 70–99)
Potassium: 3.7 mmol/L (ref 3.5–5.1)
Sodium: 136 mmol/L (ref 135–145)

## 2021-07-23 LAB — GLUCOSE, CAPILLARY
Glucose-Capillary: 143 mg/dL — ABNORMAL HIGH (ref 70–99)
Glucose-Capillary: 203 mg/dL — ABNORMAL HIGH (ref 70–99)
Glucose-Capillary: 184 mg/dL — ABNORMAL HIGH (ref 70–99)
Glucose-Capillary: 216 mg/dL — ABNORMAL HIGH (ref 70–99)

## 2021-07-23 LAB — CBC
HCT: 24.3 % — ABNORMAL LOW (ref 39.0–52.0)
Hemoglobin: 8.1 g/dL — ABNORMAL LOW (ref 13.0–17.0)
MCH: 28.9 pg (ref 26.0–34.0)
MCHC: 33.3 g/dL (ref 30.0–36.0)
MCV: 86.8 fL (ref 80.0–100.0)
Platelets: 252 10*3/uL (ref 150–400)
RBC: 2.8 MIL/uL — ABNORMAL LOW (ref 4.22–5.81)
RDW: 13.9 % (ref 11.5–15.5)
WBC: 7.8 10*3/uL (ref 4.0–10.5)
nRBC: 0 % (ref 0.0–0.2)

## 2021-07-23 SURGERY — LEFT HEART CATH AND CORONARY ANGIOGRAPHY
Anesthesia: LOCAL

## 2021-07-23 MED ORDER — SODIUM CHLORIDE 0.9% FLUSH
3.0000 mL | Freq: Two times a day (BID) | INTRAVENOUS | Status: DC
  Administered 2021-07-23: 3 mL via INTRAVENOUS

## 2021-07-23 MED ORDER — VERAPAMIL HCL 2.5 MG/ML IV SOLN
INTRAVENOUS | Status: AC
  Filled 2021-07-23: qty 2

## 2021-07-23 MED ORDER — NITROGLYCERIN 1 MG/10 ML FOR IR/CATH LAB
INTRA_ARTERIAL | Status: AC
  Filled 2021-07-23: qty 10

## 2021-07-23 MED ORDER — SODIUM CHLORIDE 0.9 % IV SOLN: 250.0000 mL | INTRAVENOUS | Status: DC | PRN

## 2021-07-23 MED ORDER — SODIUM CHLORIDE 0.9 % IV SOLN
INTRAVENOUS | Status: DC
Start: 2021-07-24 — End: 2021-07-23

## 2021-07-23 MED ORDER — HEPARIN SODIUM (PORCINE) 1000 UNIT/ML IJ SOLN
INTRAMUSCULAR | Status: AC
  Filled 2021-07-23: qty 1

## 2021-07-23 MED ORDER — VERAPAMIL HCL 2.5 MG/ML IV SOLN
INTRA_ARTERIAL | Status: DC | PRN
  Administered 2021-07-23: 5 mL via INTRA_ARTERIAL

## 2021-07-23 MED ORDER — LIDOCAINE HCL (PF) 1 % IJ SOLN
INTRAMUSCULAR | Status: DC | PRN
  Administered 2021-07-23: 2 mL

## 2021-07-23 MED ORDER — ATORVASTATIN CALCIUM 80 MG PO TABS: 80.0000 mg | ORAL_TABLET | Freq: Every day | ORAL | Status: DC

## 2021-07-23 MED ORDER — HEPARIN (PORCINE) IN NACL 1000-0.9 UT/500ML-% IV SOLN
INTRAVENOUS | Status: DC | PRN
  Administered 2021-07-23 (×2): 500 mL

## 2021-07-23 MED ORDER — ASPIRIN 81 MG PO CHEW: 81.0000 mg | CHEWABLE_TABLET | ORAL | Status: DC

## 2021-07-23 MED ORDER — ASPIRIN 81 MG PO CHEW: 81.0000 mg | CHEWABLE_TABLET | Freq: Every day | ORAL | Status: DC

## 2021-07-23 MED ORDER — ACETAMINOPHEN 325 MG PO TABS: 650.0000 mg | ORAL_TABLET | ORAL | Status: DC | PRN

## 2021-07-23 MED ORDER — SODIUM CHLORIDE 0.9 % IV SOLN: INTRAVENOUS | Status: AC

## 2021-07-23 MED ORDER — TICAGRELOR 90 MG PO TABS: 90.0000 mg | ORAL_TABLET | Freq: Two times a day (BID) | ORAL | Status: DC

## 2021-07-23 MED ORDER — ASPIRIN 81 MG PO CHEW
81.0000 mg | CHEWABLE_TABLET | ORAL | Status: AC
  Administered 2021-07-23: 81 mg via ORAL
  Filled 2021-07-23: qty 1

## 2021-07-23 MED ORDER — LIDOCAINE HCL (PF) 1 % IJ SOLN
INTRAMUSCULAR | Status: AC
  Filled 2021-07-23: qty 30

## 2021-07-23 MED ORDER — HEPARIN SODIUM (PORCINE) 1000 UNIT/ML IJ SOLN
INTRAMUSCULAR | Status: DC | PRN
  Administered 2021-07-23: 4500 [IU] via INTRAVENOUS

## 2021-07-23 MED ORDER — ONDANSETRON HCL 4 MG/2ML IJ SOLN: 4.0000 mg | Freq: Four times a day (QID) | INTRAMUSCULAR | Status: DC | PRN

## 2021-07-23 MED ORDER — FUROSEMIDE 10 MG/ML IJ SOLN
40.0000 mg | Freq: Once | INTRAMUSCULAR | Status: AC
  Administered 2021-07-23: 40 mg via INTRAVENOUS
  Filled 2021-07-23: qty 4

## 2021-07-23 MED ORDER — LABETALOL HCL 5 MG/ML IV SOLN
10.0000 mg | INTRAVENOUS | Status: AC | PRN
Start: 2021-07-23 — End: 2021-07-23

## 2021-07-23 MED ORDER — SODIUM CHLORIDE 0.9% FLUSH: 3.0000 mL | INTRAVENOUS | Status: DC | PRN

## 2021-07-23 MED ORDER — MORPHINE SULFATE (PF) 2 MG/ML IV SOLN: 2.0000 mg | INTRAVENOUS | Status: DC | PRN

## 2021-07-23 MED ORDER — HYDRALAZINE HCL 20 MG/ML IJ SOLN
10.0000 mg | INTRAMUSCULAR | Status: AC | PRN
Start: 2021-07-23 — End: 2021-07-23

## 2021-07-23 MED ORDER — HEPARIN SODIUM (PORCINE) 5000 UNIT/ML IJ SOLN
5000.0000 [IU] | Freq: Three times a day (TID) | INTRAMUSCULAR | Status: DC
  Administered 2021-07-23 – 2021-07-24 (×2): 5000 [IU] via SUBCUTANEOUS
  Filled 2021-07-23 (×2): qty 1

## 2021-07-23 MED ORDER — HEPARIN (PORCINE) IN NACL 1000-0.9 UT/500ML-% IV SOLN
INTRAVENOUS | Status: AC
  Filled 2021-07-23: qty 1000

## 2021-07-23 MED ORDER — SODIUM CHLORIDE 0.9 % IV SOLN: INTRAVENOUS | Status: DC

## 2021-07-23 MED ORDER — IOHEXOL 350 MG/ML SOLN
INTRAVENOUS | Status: DC | PRN
  Administered 2021-07-23: 50 mL via INTRA_ARTERIAL

## 2021-07-23 SURGICAL SUPPLY — 9 items
CATH INFINITI 5FR ANG PIGTAIL (CATHETERS) ×1 IMPLANT
CATH OPTITORQUE TIG 4.0 5F (CATHETERS) ×1 IMPLANT
DEVICE RAD COMP TR BAND LRG (VASCULAR PRODUCTS) ×1 IMPLANT
GUIDEWIRE INQWIRE 1.5J.035X260 (WIRE) IMPLANT
INQWIRE 1.5J .035X260CM (WIRE) ×2
KIT HEART LEFT (KITS) ×2 IMPLANT
PACK CARDIAC CATHETERIZATION (CUSTOM PROCEDURE TRAY) ×2 IMPLANT
TRANSDUCER W/STOPCOCK (MISCELLANEOUS) ×2 IMPLANT
TUBING CIL FLEX 10 FLL-RA (TUBING) ×2 IMPLANT

## 2021-07-23 NOTE — Interval H&P Note (Signed)
Cath Lab Visit (complete for each Cath Lab visit)  Clinical Evaluation Leading to the Procedure:   ACS: No.  Non-ACS:    Anginal Classification: CCS I  Anti-ischemic medical therapy: Maximal Therapy (2 or more classes of medications)  Non-Invasive Test Results: No non-invasive testing performed  Prior CABG: No previous CABG      History and Physical Interval Note:  07/23/2021 7:32 AM  [name]  has presented today for surgery, with the diagnosis of nstemi.  The various methods of treatment have been discussed with the patient and family. After consideration of risks, benefits and other options for treatment, the patient has consented to  Procedure(s): LEFT HEART CATH AND CORONARY ANGIOGRAPHY (N/A) as a surgical intervention.  The patient's history has been reviewed, patient examined, no change in status, stable for surgery.  I have reviewed the patient's chart and labs.  Questions were answered to the patient's satisfaction.     Quay Burow

## 2021-07-23 NOTE — Progress Notes (Addendum)
Progress Note  Patient Name: [name] Date of Encounter: 07/23/2021  [facility] Cardiologist: Minus Breeding, MD   Subjective   Patient returned from cath lab, states he did ok for the cardiac cath. He states he had a bad episode of SOB last night while laying in bed, felt he could not get enough air in, no hx of hypoxia. He states he had more chest pain while getting the cath today. He is urinating well.   Inpatient Medications    Scheduled Meds:  amLODipine  10 mg Oral Daily   aspirin EC  81 mg Oral Daily   atorvastatin  80 mg Oral Daily   carvedilol  12.5 mg Oral BID WC   cloNIDine  0.3 mg Oral BID   empagliflozin  10 mg Oral QAC breakfast   ferrous sulfate  325 mg Oral BID WC   gabapentin  100 mg Oral BID   heparin  5,000 Units Subcutaneous Q8H   insulin aspart  0-5 Units Subcutaneous QHS   insulin aspart  0-9 Units Subcutaneous TID WC   insulin detemir  20 Units Subcutaneous QHS   isosorbide mononitrate  60 mg Oral Daily   sodium chloride flush  3 mL Intravenous Q12H   tamsulosin  0.4 mg Oral QPC supper   ticagrelor  90 mg Oral BID   Continuous Infusions:  sodium chloride     PRN Meds: sodium chloride, acetaminophen, ondansetron (ZOFRAN) IV, sodium chloride flush   Vital Signs    Vitals:   07/22/21 1633 07/22/21 1934 07/23/21 0301 07/23/21 0747  BP: (!) 126/57 135/64 (!) 141/67   Pulse: 63 62 61   Resp:  17 18   Temp:  98.4 F (36.9 C) 98.3 F (36.8 C)   TempSrc:  Oral Oral   SpO2:  97% 98% 100%  Weight:   90.9 kg   Height:        Intake/Output Summary (Last 24 hours) at 07/23/2021 0827 Last data filed at 07/23/2021 0721 Gross per 24 hour  Intake 360 ml  Output 600 ml  Net -240 ml   Last 3 Weights 07/23/2021 07/22/2021 07/21/2021  Weight (lbs) 200 lb 6.4 oz 199 lb 11.2 oz 202 lb 6.4 oz  Weight (kg) 90.901 kg 90.583 kg 91.808 kg      Telemetry    Sinus rhythm 60s , occasional PVCs - Personally Reviewed  ECG    N/A this AM  -  Personally Reviewed  Physical Exam   GEN: No acute distress.   Neck: JVD 7cm while HOB 30 degree Cardiac: RRR, no murmurs, rubs, or gallops.  Respiratory: Clear to auscultation bilaterally. On room air pox 98%  GI: Soft, nontender, non-distended  MS: Trace edema; No deformity. Neuro:  Nonfocal  Psych: Normal affect  Right radial site with TR band, no bruise or bleeding, right hand is warm to touch, no motor or sensory deficit   Labs    High Sensitivity Troponin:   Recent Labs  Lab 07/13/21 1119 07/13/21 1319 07/19/21 1200 07/19/21 1349  TROPONINIHS 16 16 54* 62*      Chemistry Recent Labs  Lab 07/21/21 0348 07/22/21 0117 07/23/21 0253  NA 137 138 136  K 3.1* 3.7 3.7  CL 106 106 105  CO2 25 26 23   GLUCOSE 119* 148* 182*  BUN 20 19 18   CREATININE 1.89* 2.01* 2.03*  CALCIUM 8.5* 8.4* 8.9  PROT 5.7*  --   --   ALBUMIN 2.6*  --   --  AST 15  --   --   ALT 11  --   --   ALKPHOS 59  --   --   BILITOT 1.2  --   --   GFRNONAA 38* 36* 35*  ANIONGAP 6 6 8      Hematology Recent Labs  Lab 07/21/21 0348 07/22/21 0117 07/23/21 0253  WBC 8.4 8.3 7.8  RBC 2.95*  3.03* 2.78* 2.80*  HGB 8.5* 7.9* 8.1*  HCT 25.2* 23.8* 24.3*  MCV 85.4 85.6 86.8  MCH 28.8 28.4 28.9  MCHC 33.7 33.2 33.3  RDW 13.9 13.8 13.9  PLT 235 251 252    BNP Recent Labs  Lab 07/19/21 1200  BNP 802.0*     DDimer No results for input(s): DDIMER in the last 168 hours.   Radiology    CARDIAC CATHETERIZATION  Result Date: 07/23/2021 Images from the original result were not included.   Ost LAD to Prox LAD lesion is 20% stenosed.   Mid LAD-1 lesion is 50% stenosed.   Dist LAD lesion is 85% stenosed.   Dist Cx lesion is 90% stenosed.   Prox RCA lesion is 30% stenosed.   2nd Diag lesion is 90% stenosed.   2nd RPL lesion is 90% stenosed.   Prox LAD to Mid LAD lesion is 60% stenosed.   Mid LAD-2 lesion is 60% stenosed.   Non-stenotic Ost Cx to Prox Cx lesion was previously treated.   Non-stenotic  1st Mrg lesion was previously treated. Leston Schueller is a 65 y.o. male  [id number] LOCATION:  FACILITY: [facility] PHYSICIAN: Quay Burow, M.D. [birth year] DATE OF PROCEDURE:  07/23/2021 DATE OF DISCHARGE: CARDIAC CATHETERIZATION History obtained from chart review.65 y.o. male with CAD with recent NSTEMI 05/2021 with staged PCI/DES to LCx/OM1 bifurcation (other disease treated medically), CKD stage IIIb, chronic appearing anemia, DM, HTN, HLD . Recently seen in ED 07/13/21 with atypical chest pain and ruled out - at that time Dr. Harl Bowie suggested to increase amlodipine to 10mg  daily but do not see this occurred. Returned to hospital with worsening DOE, orthopnea, and HTN. He was admitted for diastolic CHF.  He was diuresed 7 L.  His EF was normal by 2D echo.  His troponins were in the 60 range and flat.  Because of recurrent chest pain it was elected to proceed with diagnostic coronary angiography to define his anatomy.  His serum creatinine is 2.   Mr. Petrosyan anatomy is unchanged from his most recent intervention in July.  His circumflex obtuse marginal branch bifurcation stent was widely patent.  The remainder of his anatomy was unchanged.  He does have a moderate mid LAD lesion which is hypodense but similar angiographically to what it was during his recent catheterization.  His symptoms are somewhat atypical and last for hours.  His troponins are low and flat.  I do not think his symptoms are ischemically mediated.  Medical therapy will be recommended.  The sheath was removed and a TR band was placed on the right wrist to achieve patent hemostasis.  The patient left lab in stable condition. Quay Burow. MD, [facility] 07/23/2021 8:14 AM     Cardiac Studies   LHC on 07/23/21:    Ost LAD to Prox LAD lesion is 20% stenosed.   Mid LAD-1 lesion is 50% stenosed.   Dist LAD lesion is 85% stenosed.   Dist Cx lesion is 90% stenosed.   Prox RCA lesion is 30% stenosed.   2nd Diag lesion is 90% stenosed.  2nd RPL  lesion is 90% stenosed.   Prox LAD to Mid LAD lesion is 60% stenosed.   Mid LAD-2 lesion is 60% stenosed.   Non-stenotic Ost Cx to Prox Cx lesion was previously treated.   Non-stenotic 1st Mrg lesion was previously treated.  IMPRESSION: Mr. Serviss anatomy is unchanged from his most recent intervention in July.  His circumflex obtuse marginal branch bifurcation stent was widely patent.  The remainder of his anatomy was unchanged.  He does have a moderate mid LAD lesion which is hypodense but similar angiographically to what it was during his recent catheterization.  His symptoms are somewhat atypical and last for hours.  His troponins are low and flat.  I do not think his symptoms are ischemically mediated.  Medical therapy will be recommended.  The sheath was removed and a TR band was placed on the right wrist to achieve patent hemostasis.  The patient left lab in stable condition.   Diagnostic Dominance: Right   Echo from 07/20/21:    1. Left ventricular ejection fraction, by estimation, is 55 to 60%. The  left ventricle has normal function. The left ventricle has no regional  wall motion abnormalities. There is mild left ventricular hypertrophy.  Left ventricular diastolic parameters  are indeterminate.   2. Right ventricular systolic function is normal. The right ventricular  size is normal. Tricuspid regurgitation signal is inadequate for assessing  PA pressure.   3. Left atrial size was mildly dilated.   4. The inferior vena cava is normal in size with greater than 50%  respiratory variability, suggesting right atrial pressure of 3 mmHg.   5. Limited echo with no color doppler.    Patient Profile     65 y.o. male with CAD with recent NSTEMI 05/2021 with staged PCI/DES to LCx/OM1 bifurcation (other disease treated medically), CKD stage IIIb, chronic anemia, DM, HTN, HLD . Recently seen in ED 07/13/21 with atypical chest pain and ruled out ACS - at that time Dr. Harl Bowie suggested to  increase amlodipine to 10mg  daily but do not see this occurred. Returned to hospital 07/19/21 with worsening DOE, orthopnea, and HTN. He was admitted for diastolic CHF.  Assessment & Plan    Atypical chest pain CAD s/p recent NSTEMI/PCI 05/2021 - c/o intermittent chest pain similar to his cardiac pain during hospitalization  - Hs troponin low/flat 54->62, not suggestive of ACS - LHC today showed  His circumflex obtuse marginal branch bifurcation stent was widely patent.  The remainder of his anatomy was unchanged.  He does have a moderate mid LAD lesion which is hypodense but similar angiographically to what it was during his recent catheterization in July 2022  - [facility] [place] therapy: continue DAPT with ASA 81mg  and Brilinta 90mg  BID, amlodipine 10mg , Coreg 12.5mg  BID, Isosorbide 60mg  daily, and atorvastatin 80mg  daily.  - post cath care and radial site care discussed in detail at bedside  - will monitor closely for CIN post cath, trend BMP   Acute diastolic CHF - inciting factor not clear, possibly poorly controlled HTN +/- ?contribution of hypoalbuminemia in context of CKD  - Echo 9/5 showed EF 55-60%, no RWMA, mild LVH, indeterminate diastolic parameters, mild dilation of LA  - diuresed well with IV Lasix, Net -7.2L, weight down from 207.45 >200.4 ibs since admission; lasix held before cath since 07/22/21, had worsening SOB at rest overnight, plan to resume if no evidence of CIN and stable renal function, will defer to MD  - BP meds titrated  to goal, see above regimen  - will avoid ACEI/ARB in the setting of CKD - query Brilinta induced dyspnea,although he has been taking the med for 2 month   Acute on chronic anemia  -Hgb dropped from 10-13 ranges to 8.1 today over the past few month  -iron panel reviewed, FOBT negative, 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    Type 2 DM - continue Levemir 20 unit QHS,  Novolog insulin SSI, Jardiance 10mg   - A1C 6.5% from 06/08/21 , fair control  - glycemic control is adequate this admission goal 140-180      For questions or updates, please contact [place] Please consult [email or web address] for contact info under        Signed, Margie Billet, NP  07/23/2021, 8:27 AM   As above, patient seen and examined.  He continues with intermittent chest pain and was dyspneic last evening.  His cath results are noted.  Recently placed stent is widely patent.  He does have distal disease but medical therapy is best option.  Continue amlodipine, carvedilol and isosorbide.  His LVEDP was elevated.  We will give Lasix 40 mg IV today.  Recheck potassium and renal function tomorrow.  Potential discharge if renal function stable. Kirk Ruths, MD

## 2021-07-24 ENCOUNTER — Other Ambulatory Visit: Payer: Self-pay | Admitting: Home Health

## 2021-07-24 ENCOUNTER — Other Ambulatory Visit (HOSPITAL_COMMUNITY): Payer: Self-pay

## 2021-07-24 ENCOUNTER — Ambulatory Visit: Payer: PPO | Admitting: Family Medicine

## 2021-07-24 DIAGNOSIS — I5031 Acute diastolic (congestive) heart failure: Secondary | ICD-10-CM

## 2021-07-24 DIAGNOSIS — R079 Chest pain, unspecified: Secondary | ICD-10-CM

## 2021-07-24 DIAGNOSIS — Z5189 Encounter for other specified aftercare: Secondary | ICD-10-CM

## 2021-07-24 DIAGNOSIS — E785 Hyperlipidemia, unspecified: Secondary | ICD-10-CM

## 2021-07-24 DIAGNOSIS — I10 Essential (primary) hypertension: Secondary | ICD-10-CM

## 2021-07-24 DIAGNOSIS — N1832 Chronic kidney disease, stage 3b: Secondary | ICD-10-CM

## 2021-07-24 DIAGNOSIS — I2510 Atherosclerotic heart disease of native coronary artery without angina pectoris: Secondary | ICD-10-CM

## 2021-07-24 LAB — BASIC METABOLIC PANEL
Anion gap: 10 (ref 5–15)
BUN: 20 mg/dL (ref 8–23)
CO2: 25 mmol/L (ref 22–32)
Calcium: 9.2 mg/dL (ref 8.9–10.3)
Chloride: 105 mmol/L (ref 98–111)
Creatinine, Ser: 2.16 mg/dL — ABNORMAL HIGH (ref 0.61–1.24)
GFR, Estimated: 33 mL/min — ABNORMAL LOW (ref >60)
Glucose, Bld: 123 mg/dL — ABNORMAL HIGH (ref 70–99)
Potassium: 3.7 mmol/L (ref 3.5–5.1)
Sodium: 140 mmol/L (ref 135–145)

## 2021-07-24 LAB — CBC
HCT: 25.7 % — ABNORMAL LOW (ref 39.0–52.0)
Hemoglobin: 8.6 g/dL — ABNORMAL LOW (ref 13.0–17.0)
MCH: 28.9 pg (ref 26.0–34.0)
MCHC: 33.5 g/dL (ref 30.0–36.0)
MCV: 86.2 fL (ref 80.0–100.0)
Platelets: 263 10*3/uL (ref 150–400)
RBC: 2.98 MIL/uL — ABNORMAL LOW (ref 4.22–5.81)
RDW: 13.8 % (ref 11.5–15.5)
WBC: 8.5 10*3/uL (ref 4.0–10.5)
nRBC: 0 % (ref 0.0–0.2)

## 2021-07-24 LAB — GLUCOSE, CAPILLARY
Glucose-Capillary: 112 mg/dL — ABNORMAL HIGH (ref 70–99)
Glucose-Capillary: 207 mg/dL — ABNORMAL HIGH (ref 70–99)

## 2021-07-24 MED ORDER — AMLODIPINE BESYLATE 10 MG PO TABS
10.0000 mg | ORAL_TABLET | Freq: Every day | ORAL | 1 refills | Status: DC
  Filled 2021-07-24 (×2): qty 90, 90d supply, fill #0

## 2021-07-24 MED ORDER — CARVEDILOL 12.5 MG PO TABS
12.5000 mg | ORAL_TABLET | Freq: Two times a day (BID) | ORAL | 2 refills | Status: DC
  Filled 2021-07-24: qty 60, 30d supply, fill #0

## 2021-07-24 MED ORDER — FUROSEMIDE 40 MG PO TABS
40.0000 mg | ORAL_TABLET | Freq: Every day | ORAL | 1 refills | Status: DC
  Filled 2021-07-24: qty 30, 30d supply, fill #0

## 2021-07-24 MED ORDER — FUROSEMIDE 40 MG PO TABS
40.0000 mg | ORAL_TABLET | Freq: Every day | ORAL | Status: DC
  Administered 2021-07-24: 40 mg via ORAL
  Filled 2021-07-24: qty 1

## 2021-07-24 MED ORDER — ATORVASTATIN CALCIUM 80 MG PO TABS
80.0000 mg | ORAL_TABLET | Freq: Every day | ORAL | 1 refills | Status: DC
  Filled 2021-07-24: qty 90, 90d supply, fill #0

## 2021-07-24 MED ORDER — ISOSORBIDE MONONITRATE ER 60 MG PO TB24
60.0000 mg | ORAL_TABLET | Freq: Every day | ORAL | 1 refills | Status: DC
  Filled 2021-07-24: qty 90, 90d supply, fill #0

## 2021-07-24 NOTE — Care Management Important Message (Signed)
Important Message  Patient Details  Name: [name] MRN: [id number] Date of Birth: 1956/02/23   Medicare Important Message Given:  Yes  Patient left prior to IM delivery will  mail to the patient home address.    Ameer Sanden 07/24/2021, 3:58 PM

## 2021-07-24 NOTE — Progress Notes (Signed)
Progress Note  Patient Name: [name] Date of Encounter: 07/24/2021  [facility] Cardiologist: Minus Breeding, MD   Subjective   Continues with intermittent chest pain but no dyspnea.  Inpatient Medications    Scheduled Meds:  amLODipine  10 mg Oral Daily   aspirin EC  81 mg Oral Daily   atorvastatin  80 mg Oral Daily   carvedilol  12.5 mg Oral BID WC   cloNIDine  0.3 mg Oral BID   empagliflozin  10 mg Oral QAC breakfast   ferrous sulfate  325 mg Oral BID WC   gabapentin  100 mg Oral BID   heparin  5,000 Units Subcutaneous Q8H   insulin aspart  0-5 Units Subcutaneous QHS   insulin aspart  0-9 Units Subcutaneous TID WC   insulin detemir  20 Units Subcutaneous QHS   isosorbide mononitrate  60 mg Oral Daily   sodium chloride flush  3 mL Intravenous Q12H   sodium chloride flush  3 mL Intravenous Q12H   tamsulosin  0.4 mg Oral QPC supper   ticagrelor  90 mg Oral BID   Continuous Infusions:  sodium chloride     sodium chloride     PRN Meds: sodium chloride, sodium chloride, acetaminophen, morphine injection, ondansetron (ZOFRAN) IV, sodium chloride flush, sodium chloride flush   Vital Signs    Vitals:   07/23/21 1922 07/24/21 0348 07/24/21 0845 07/24/21 1044  BP: (!) 142/70 (!) 131/59 138/67 135/61  Pulse: 62 61 61 60  Resp: 18 18 18 20   Temp: 97.8 F (36.6 C) 98.4 F (36.9 C) 98.7 F (37.1 C) 98.8 F (37.1 C)  TempSrc: Oral Oral Oral Oral  SpO2: 98% 97% 100% 99%  Weight:  89.2 kg    Height:        Intake/Output Summary (Last 24 hours) at 07/24/2021 1053 Last data filed at 07/24/2021 0757 Gross per 24 hour  Intake 960 ml  Output --  Net 960 ml   Last 3 Weights 07/24/2021 07/23/2021 07/22/2021  Weight (lbs) 196 lb 9.6 oz 200 lb 6.4 oz 199 lb 11.2 oz  Weight (kg) 89.177 kg 90.901 kg 90.583 kg      Telemetry    Sinus - Personally Reviewed   Physical Exam   GEN: No acute distress.   Neck: No JVD Cardiac: RRR, no murmurs, rubs, or gallops.   Respiratory: Clear to auscultation bilaterally. GI: Soft, nontender, non-distended  MS: No edema; radial cath site with no hematoma Neuro:  Nonfocal  Psych: Normal affect   Labs    High Sensitivity Troponin:   Recent Labs  Lab 07/13/21 1119 07/13/21 1319 07/19/21 1200 07/19/21 1349  TROPONINIHS 16 16 54* 62*      Chemistry Recent Labs  Lab 07/21/21 0348 07/22/21 0117 07/23/21 0253 07/24/21 0326  NA 137 138 136 140  K 3.1* 3.7 3.7 3.7  CL 106 106 105 105  CO2 25 26 23 25   GLUCOSE 119* 148* 182* 123*  BUN 20 19 18 20   CREATININE 1.89* 2.01* 2.03* 2.16*  CALCIUM 8.5* 8.4* 8.9 9.2  PROT 5.7*  --   --   --   ALBUMIN 2.6*  --   --   --   AST 15  --   --   --   ALT 11  --   --   --   ALKPHOS 59  --   --   --   BILITOT 1.2  --   --   --  GFRNONAA 38* 36* 35* 33*  ANIONGAP 6 6 8 10      Hematology Recent Labs  Lab 07/22/21 0117 07/23/21 0253 07/24/21 0326  WBC 8.3 7.8 8.5  RBC 2.78* 2.80* 2.98*  HGB 7.9* 8.1* 8.6*  HCT 23.8* 24.3* 25.7*  MCV 85.6 86.8 86.2  MCH 28.4 28.9 28.9  MCHC 33.2 33.3 33.5  RDW 13.8 13.9 13.8  PLT 251 252 263    BNP Recent Labs  Lab 07/19/21 1200  BNP 802.0*      Radiology    CARDIAC CATHETERIZATION  Result Date: 07/23/2021 Images from the original result were not included.   Ost LAD to Prox LAD lesion is 20% stenosed.   Mid LAD-1 lesion is 50% stenosed.   Dist LAD lesion is 85% stenosed.   Dist Cx lesion is 90% stenosed.   Prox RCA lesion is 30% stenosed.   2nd Diag lesion is 90% stenosed.   2nd RPL lesion is 90% stenosed.   Prox LAD to Mid LAD lesion is 60% stenosed.   Mid LAD-2 lesion is 60% stenosed.   Non-stenotic Ost Cx to Prox Cx lesion was previously treated.   Non-stenotic 1st Mrg lesion was previously treated. [name] is a 65 y.o. male  [id number] LOCATION:  FACILITY: [facility] PHYSICIAN: Quay Burow, M.D. 07/04/1956 DATE OF PROCEDURE:  07/23/2021 DATE OF DISCHARGE: CARDIAC CATHETERIZATION History obtained from  chart review.65 y.o. male with CAD with recent NSTEMI 05/2021 with staged PCI/DES to LCx/OM1 bifurcation (other disease treated medically), CKD stage IIIb, chronic appearing anemia, DM, HTN, HLD . Recently seen in ED 07/13/21 with atypical chest pain and ruled out - at that time Dr. Harl Bowie suggested to increase amlodipine to 10mg  daily but do not see this occurred. Returned to hospital with worsening DOE, orthopnea, and HTN. He was admitted for diastolic CHF.  He was diuresed 7 L.  His EF was normal by 2D echo.  His troponins were in the 60 range and flat.  Because of recurrent chest pain it was elected to proceed with diagnostic coronary angiography to define his anatomy.  His serum creatinine is 2.   Mr. Neisler anatomy is unchanged from his most recent intervention in July.  His circumflex obtuse marginal branch bifurcation stent was widely patent.  The remainder of his anatomy was unchanged.  He does have a moderate mid LAD lesion which is hypodense but similar angiographically to what it was during his recent catheterization.  His symptoms are somewhat atypical and last for hours.  His troponins are low and flat.  I do not think his symptoms are ischemically mediated.  Medical therapy will be recommended.  The sheath was removed and a TR band was placed on the right wrist to achieve patent hemostasis.  The patient left lab in stable condition. Quay Burow. MD, [facility] 07/23/2021 8:14 AM      Patient Profile     65 y.o. male with CAD with recent NSTEMI 05/2021 with staged PCI/DES to LCx/OM1 bifurcation (other disease treated medically), CKD stage IIIb, chronic appearing anemia, DM, HTN, HLD . Recently seen in ED 07/13/21 with atypical chest pain and ruled out - at that time Dr. Harl Bowie suggested to increase amlodipine to 10mg  daily but do not see this occurred. Returned to hospital with worsening DOE, orthopnea, and HTN. He was admitted for diastolic CHF.  Follow-up echocardiogram showed ejection fraction 55 to  60%, mild left ventricular hypertrophy, mild left atrial enlargement.  Assessment & Plan    1 chest  pain-patient continues to have intermittent chest pain.  However his cardiac catheterization revealed patent stent and distal disease with no options other than medical therapy.  We will continue with present medical regimen.  2 acute diastolic congestive heart failure-symptoms have improved compared to admission.  We will discharge on Lasix 40 mg IV daily.  Check potassium and renal function on Monday with results to Dr. Percival Spanish.  3 chronic stage IIIb kidney disease-patient is at risk for contrast nephropathy.  Recheck renal function on Monday.  For coronary artery disease-continue aspirin, Brilinta and statin.  5 hypertension-blood pressure controlled.  Continue present medications and follow.  6 hyperlipidemia-continue statin.  7 anemia-likely contribution from renal dysfunction.  Continue iron at discharge.  Discharge today on present medications.  Check bmet on Monday.  Follow-up with APP 2 to 4 weeks.  Follow-up Dr. Percival Spanish approximately 3 months.  Greater than 30 minutes PA and physician time. D2  For questions or updates, please contact [place] Please consult [email or web address] for contact info under        Signed, Kirk Ruths, MD  07/24/2021, 10:53 AM

## 2021-07-24 NOTE — Discharge Summary (Signed)
Discharge Summary    Patient ID: [name] MRN: [id number]; DOB: 1955-12-29  Admit date: 07/19/2021 Discharge date: 07/24/2021  PCP:  [name], DO   [place] Prov[facility] Cardiologist:  Minus Breeding, MD   {  Discharge Diagnoses    Principal Problem:   Acute combined systolic and diastolic heart failure ([place]) Active Problems:   Hypertension associated with diabetes ([place])   Hyperlipidemia associated with type 2 diabetes mellitus ([place])   Chronic kidney disease (CKD) stage G3b/A2, moderately decreased glomerular filtration rate (GFR) between 30-44 mL/min/1.73 square meter and albuminuria creatinine ratio between 30-299 mg/g (HCC)   Congestive heart failure (CHF) (HCC)   Coronary artery disease involving native coronary artery of native heart with angina pectoris [facility])    Diagnostic Studies/Procedures    LHC from 07/23/21:    Ost LAD to Prox LAD lesion is 20% stenosed.   Mid LAD-1 lesion is 50% stenosed.   Dist LAD lesion is 85% stenosed.   Dist Cx lesion is 90% stenosed.   Prox RCA lesion is 30% stenosed.   2nd Diag lesion is 90% stenosed.   2nd RPL lesion is 90% stenosed.   Prox LAD to Mid LAD lesion is 60% stenosed.   Mid LAD-2 lesion is 60% stenosed.   Non-stenotic Ost Cx to Prox Cx lesion was previously treated.   Non-stenotic 1st Mrg lesion was previously treated.  IMPRESSION: [name] anatomy is unchanged from his most recent intervention in July.  His circumflex obtuse marginal branch bifurcation stent was widely patent.  The remainder of his anatomy was unchanged.  He does have a moderate mid LAD lesion which is hypodense but similar angiographically to what it was during his recent catheterization.  His symptoms are somewhat atypical and last for hours.  His troponins are low and flat.  I do not think his symptoms are ischemically mediated.  Medical therapy will be recommended.  The sheath was removed and a TR band was placed on the right wrist  to achieve patent hemostasis.  The patient left lab in stable condition.  Echo from 07/20/21:   1. Left ventricular ejection fraction, by estimation, is 55 to 60%. The  left ventricle has normal function. The left ventricle has no regional  wall motion abnormalities. There is mild left ventricular hypertrophy.  Left ventricular diastolic parameters  are indeterminate.   2. Right ventricular systolic function is normal. The right ventricular  size is normal. Tricuspid regurgitation signal is inadequate for assessing  PA pressure.   3. Left atrial size was mildly dilated.   4. The inferior vena cava is normal in size with greater than 50%  respiratory variability, suggesting right atrial pressure of 3 mmHg.   5. Limited echo with no color doppler.    _____________   History of Present Illness     Per admission H&P:  [name] is a 65 year old male with hypertension, hyperlipidemia, CAD with recent NSTEMI/PCI to LCx/OM1 bifurcation (other disease treated medically) 05/2021, CKD III, type 2 diabetes, who recently was seen in ED 07/13/21 with atypical chest pain and ruled out ACS - at that time Dr. Harl Bowie suggested to increase amlodipine to 10mg  daily but do not see this occurred. He presented to the emergency department again on 07/19/21 with worsening dyspnea on exertion, lower extremity edema, and orthopnea.  On presentation to the emergency department he was noted to be hypertensive with mild leukocytosis, creatinine 1.8, BNP 800, mild elevation of troponin with minimal delta.  Chest x-ray  with evidence of pulmonary edema.  He was given Lasix in the emergency department and had good urinary output.  Transferred to [facility] for ongoing management with cardiology.    Hospital Course     Consultants: N/A    Atypical chest pain CAD s/p recent NSTEMI/PCI 05/2021 - c/o intermittent chest pain similar to his cardiac pain during hospitalization  - Hs troponin low/flat 54->62, not suggestive of  ACS - LHC 07/23/21 showed circumflex obtuse marginal branch bifurcation stent was widely patent.  The remainder of his anatomy was unchanged.  He does have a moderate mid LAD lesion which is hypodense but similar angiographically to what it was during his recent catheterization in July 2022  - [facility] therapy: continue DAPT with ASA 81mg  and Brilinta 90mg  BID, increased amlodipine to 10mg , started Coreg 12.5mg  BID (was not taking at home PTA), started Isosorbide 60mg  daily, and started atorvastatin 80mg  daily (was not taking at home PTA), all new script sent to [facility], he will get meds before DC today  - post cath care and radial site care discussed in detail over the phone  - repeat BMP on Monday 07/27/21 recommend by rounding MD for monitor of CIN, BMP ordered and advised patient go to [facility] for [facility] work , he prefers to get it done at his nephrology office which is OK too  - follow up appt arranged on 08/03/21 with cardiology at [facility] office    Acute diastolic CHF - inciting factor not clear, possibly poorly controlled HTN +/- ?contribution of hypoalbuminemia in context of CKD  - Echo 9/5 showed EF 55-60%, no RWMA, mild LVH, indeterminate diastolic parameters, mild dilation of LA  - diuresed well with IV Lasix, Net -6.3L, weight down from 207.45 >196.6 ibs since admission; lasix held before cath on  07/22/21, had worsening SOB and resumed Lasix on 9/8, renal index mildly elevated with Cr 2.16 today, rounding MD recommend DC on PO Lasix 40mg  daily, repeat BMP Monday 07/27/21 - BP meds titrated to goal with current regimen  - will avoid ACEI/ARB in the setting of CKD IIIb - unlikely Brilinta induced dyspnea, he has been taking the med for 2 month  - continue GDMT: started Coreg 12.5mg  BID (was not taking at home PTA) and continue Jardiance    Acute on chronic anemia  -Hgb dropped from 10-13 ranges to 8.6 today over the past few months, no bleeding  -iron panel reviewed,  FOBT negative, likely anemia from CKD  - follow nephrology    CKD stage IIIb - baseline Cr appears around this 1.8-1.9 range, Cr slightly elevated to 2.16 today - repeat outpatient BMP ordered for 07/27/21, follow with Dr Percival Spanish per rounding MD - [facility] nephrology as scheduled   HTN - overall improved with med titration 135/61 -142/70 over the past 24 hours,  increased amlodipine to 10mg , started Coreg 12.5mg  BID (was not taking at home PTA), started Isosorbide 60mg  daily, continued on clonidine 0.3mg  BID, and added Lasix 40mg  daily this admission  - follow up outpatient for BP check    Hyperlipidemia - LDL at goal at 61, started atorvastatin 80mg  daily this admission, was not taking PTA    Type 2 DM - A1C 6.5% from 06/08/21 , fair control  - glycemic control is adequate this admission goal 140-200s  - resume home regimen Tresiba scale and Jardiance 10mg  at DC - follow up with PCP       Did the patient have an acute coronary  syndrome (MI, NSTEMI, STEMI, etc) this admission?:  No                               Did the patient have a percutaneous coronary intervention (stent / angioplasty)?:  No.       _____________  Discharge Vitals Blood pressure 135/61, pulse 60, temperature 98.8 F (37.1 C), temperature source Oral, resp. rate 20, height 6' (1.829 m), weight 89.2 kg, SpO2 99 %.  Filed Weights   07/22/21 0342 07/23/21 0301 07/24/21 0348  Weight: 90.6 kg 90.9 kg 89.2 kg    Labs & Radiologic Studies    CBC Recent Labs    07/23/21 0253 07/24/21 0326  WBC 7.8 8.5  HGB 8.1* 8.6*  HCT 24.3* 25.7*  MCV 86.8 86.2  PLT 252 643   Basic Metabolic Panel Recent Labs    07/23/21 0253 07/24/21 0326  NA 136 140  K 3.7 3.7  CL 105 105  CO2 23 25  GLUCOSE 182* 123*  BUN 18 20  CREATININE 2.03* 2.16*  CALCIUM 8.9 9.2   Liver Function Tests No results for input(s): AST, ALT, ALKPHOS, BILITOT, PROT, ALBUMIN in the last 72 hours. No results for input(s): LIPASE, AMYLASE in  the last 72 hours. High Sensitivity Troponin:   Recent Labs  Lab 07/13/21 1119 07/13/21 1319 07/19/21 1200 07/19/21 1349  TROPONINIHS 16 16 54* 62*    BNP Invalid input(s): POCBNP D-Dimer No results for input(s): DDIMER in the last 72 hours. Hemoglobin A1C No results for input(s): HGBA1C in the last 72 hours. Fasting Lipid Panel No results for input(s): CHOL, HDL, LDLCALC, TRIG, CHOLHDL, LDLDIRECT in the last 72 hours. Thyroid Function Tests No results for input(s): TSH, T4TOTAL, T3FREE, THYROIDAB in the last 72 hours.  Invalid input(s): FREET3 _____________  DG Chest 1 View  Result Date: 07/13/2021 CLINICAL DATA:  Chest pain. EXAM: CHEST  1 VIEW COMPARISON:  June 07, 2021. FINDINGS: The heart size and mediastinal contours are within normal limits. Both lungs are clear. The visualized skeletal structures are unremarkable. IMPRESSION: No active disease. Electronically Signed   By: Marijo Conception M.D.   On: 07/13/2021 11:55   CARDIAC CATHETERIZATION  Result Date: 07/23/2021 Images from the original result were not included.   Ost LAD to Prox LAD lesion is 20% stenosed.   Mid LAD-1 lesion is 50% stenosed.   Dist LAD lesion is 85% stenosed.   Dist Cx lesion is 90% stenosed.   Prox RCA lesion is 30% stenosed.   2nd Diag lesion is 90% stenosed.   2nd RPL lesion is 90% stenosed.   Prox LAD to Mid LAD lesion is 60% stenosed.   Mid LAD-2 lesion is 60% stenosed.   Non-stenotic Ost Cx to Prox Cx lesion was previously treated.   Non-stenotic 1st Mrg lesion was previously treated. [name] is a 65 y.o. male  [id number] LOCATION:  FACILITY: [facility] PHYSICIAN: Quay Burow, M.D. [birth year] DATE OF PROCEDURE:  07/23/2021 DATE OF DISCHARGE: CARDIAC CATHETERIZATION History obtained from chart review.65 y.o. male with CAD with recent NSTEMI 05/2021 with staged PCI/DES to LCx/OM1 bifurcation (other disease treated medically), CKD stage IIIb, chronic appearing anemia, DM, HTN, HLD . Recently seen in ED  07/13/21 with atypical chest pain and ruled out - at that time Dr. Harl Bowie suggested to increase amlodipine to 10mg  daily but do not see this occurred. Returned to hospital with worsening DOE, orthopnea, and HTN. He  was admitted for diastolic CHF.  He was diuresed 7 L.  His EF was normal by 2D echo.  His troponins were in the 60 range and flat.  Because of recurrent chest pain it was elected to proceed with diagnostic coronary angiography to define his anatomy.  His serum creatinine is 2.   [name] anatomy is unchanged from his most recent intervention in July.  His circumflex obtuse marginal branch bifurcation stent was widely patent.  The remainder of his anatomy was unchanged.  He does have a moderate mid LAD lesion which is hypodense but similar angiographically to what it was during his recent catheterization.  His symptoms are somewhat atypical and last for hours.  His troponins are low and flat.  I do not think his symptoms are ischemically mediated.  Medical therapy will be recommended.  The sheath was removed and a TR band was placed on the right wrist to achieve patent hemostasis.  The patient left lab in stable condition. Quay Burow. MD, [facility] 07/23/2021 8:14 AM    DG Chest Port 1 View  Result Date: 07/19/2021 CLINICAL DATA:  Shortness of breath over 6 days. EXAM: PORTABLE CHEST 1 VIEW COMPARISON:  Chest radiograph dated 07/13/2021. FINDINGS: The heart size and mediastinal contours are within normal limits. Mild diffuse bilateral interstitial and airspace opacities are noted. There is a small left pleural effusion. There is no right pleural effusion. There is no pneumothorax. The visualized skeletal structures are unremarkable. IMPRESSION: Mild diffuse bilateral interstitial and airspace opacities may represent pulmonary edema or less likely pneumonia. Small left pleural effusion. Electronically Signed   By: Zerita Boers M.D.   On: 07/19/2021 12:10   ECHOCARDIOGRAM LIMITED  Result Date:  07/20/2021    ECHOCARDIOGRAM LIMITED REPORT   Patient Name:   [name] Date of Exam: 07/20/2021 Medical Rec #:  [id number]            Height:       72.0 in Accession #:    [id number]           Weight:       203.7 lb Date of Birth:  03-Jan-1956            BSA:          2.147 m Patient Age:    77 years             BP:           141/68 mmHg Patient Gender: M                    HR:           67 bpm. Exam Location:  Inpatient Procedure: Limited Echo Indications:     CHF-Acute Systolic F79.02  History:         Patient has prior history of Echocardiogram examinations, most                  recent 06/11/2021. Previous Myocardial Infarction and CAD; Risk                  Factors:Diabetes, Hypertension and Dyslipidemia.  Sonographer:     Merrie Roof RDCS Referring Phys:  [place] Diagnosing Phys: Kirk Ruths McleanMD IMPRESSIONS  1. Left ventricular ejection fraction, by estimation, is 55 to 60%. The left ventricle has normal function. The left ventricle has no regional wall motion abnormalities. There is mild left ventricular hypertrophy. Left ventricular diastolic parameters are indeterminate.  2. Right  ventricular systolic function is normal. The right ventricular size is normal. Tricuspid regurgitation signal is inadequate for assessing PA pressure.  3. Left atrial size was mildly dilated.  4. The inferior vena cava is normal in size with greater than 50% respiratory variability, suggesting right atrial pressure of 3 mmHg.  5. Limited echo with no color doppler. FINDINGS  Left Ventricle: Left ventricular ejection fraction, by estimation, is 55 to 60%. The left ventricle has normal function. The left ventricle has no regional wall motion abnormalities. The left ventricular internal cavity size was normal in size. There is  mild left ventricular hypertrophy. Left ventricular diastolic parameters are indeterminate. Right Ventricle: The right ventricular size is normal. No increase in right ventricular wall  thickness. Right ventricular systolic function is normal. Tricuspid regurgitation signal is inadequate for assessing PA pressure. Left Atrium: Left atrial size was mildly dilated. Right Atrium: Right atrial size was normal in size. Pericardium: Trivial pericardial effusion is present. Aorta: The aortic root is normal in size and structure. Venous: The inferior vena cava is normal in size with greater than 50% respiratory variability, suggesting right atrial pressure of 3 mmHg. IAS/Shunts: No atrial level shunt detected by color flow Doppler. LEFT VENTRICLE PLAX 2D LVIDd:         5.00 cm LV PW:         1.30 cm LV IVS:        1.30 cm   AORTA Ao Root diam: 2.90 cm Dalton McleanMD Electronically signed by Franki Monte Signature Date/Time: 07/20/2021/11:56:02 AM    Final (Updated)    Disposition   Patient was seen by Dr Stanford Breed this AM for rounding, deemed stable for home discharge. Post cath and radial site care discussed with the patient. Medication changes discussed with the patient. Advised patient to follow up on 08/03/21 and obtain blood workup on 07/27/21 for renal monitor. All questions answered to satisfaction.  Pt is being discharged home today in good condition.   Follow-up Plans & Appointments     Follow-up Information     Verta Ellen., NP Follow up on 08/03/2021.   Specialty: Cardiology Why: at 230 pm for your follow-up with cardiolog Contact information: [place] [phone]         [facility] Follow up on 07/27/2021.   Why: for your blood work , arrive 8-4, no appt needed, no fasting needed Contact information: [address] [phone]               Discharge Instructions     Diet - low sodium heart healthy   Complete by: As directed    Discharge instructions   Complete by: As directed    Radial Site Care  Refer to this sheet in the next few weeks. These instructions  provide you with information on caring for yourself after your procedure. Your caregiver may also give you more specific instructions. Your treatment has been planned according to current medical practices, but problems sometimes occur. Call your caregiver if you have any problems or questions after your procedure.  HOME CARE INSTRUCTIONS You may shower the day after the procedure. Remove the bandage (dressing) and gently wash the site with plain soap and water. Gently pat the site dry.  Do not apply powder or lotion to the site.  Do not submerge the affected site in water for 3 to 5 days.  Inspect the site at least twice  daily.  Do not flex or bend the affected arm for 24 hours.  No lifting over 5 pounds (2.3 kg) for 5 days after your procedure.  Do not drive home if you are discharged the same day of the procedure. Have someone else drive you.  You may drive 24 hours after the procedure unless otherwise instructed by your caregiver.   What to expect: Any bruising will usually fade within 1 to 2 weeks.  Blood that collects in the tissue (hematoma) may be painful to the touch. It should usually decrease in size and tenderness within 1 to 2 weeks.   SEEK IMMEDIATE MEDICAL CARE IF: You have unusual pain at the radial site.  You have redness, warmth, swelling, or pain at the radial site.  You have drainage (other than a small amount of blood on the dressing).  You have chills.  You have a fever or persistent symptoms for more than 72 hours.  You have a fever and your symptoms suddenly get worse.  Your arm becomes pale, cool, tingly, or numb.  You have heavy bleeding from the site. Hold pressure on the site.   HEART FAILURE INSTRUCTIONS   Follow a low salt diet which means you need to consume less than 2 grams (2000mg ) of sodium per day.  Check the labels!  You'll be surprised to see how much sodium is in common foods and drinks.  DO NOT EAT foods high in salt, such as salted nuts, crackers,  smoked fish, fast food, deli meats, salami, pepperoni, jerky, prosciutto, ham, bacon, soups, prepared frozen dinners.  DO NOT add salt to your food when cooking or at the table.  It is ok to use other seasonings and flavors as long as they are salt free.    Drink less than 8 cups (which is ~209ml or 2 liters) of fluid per day.  This is equivalent to ~ 4 bottles of water a day.  Weigh yourself every morning before breakfast and write it down in a log.  Bring in your record to every clinic visit.  Check for swelling in your feet, ankles, legs and stomach every morning.  Take an extra dose of your fluid pill once a day for worsening shortness of breath, swelling, or >3lb weight gain in 24 hours or >5lb weight gain in 1 week.  Control your blood pressure.  If you have a blood pressure cuff, record your blood pressure & heart rate daily. Bring the record to [facility] clinic with you.  Goal BP is <140/90  Control your blood sugars if you are diabetic  Control your cholesterol.  The goal LDL (bad cholesterol) is <70  Lose weight if you are overweight  Exercise as much as you are able to strengthen your heart  Take your medicine the way you are instructed   Bring ALL your medicines and medication list to [place] clinic   Get a yearly flu shot to reduce your risk of the flu   If you are under age 11 you need the Pneumovax pneumonia vaccine as a one time shot to reduce the risk of pneumonia   If you are 69 yrs or older you need the Prevnar pneumonia vaccine as a one time shot followed by a Pneumovax pneumonia vaccine > 1 year later as a one time shot to help reduce your risk of pneumonia   Call nurse if you have trouble paying for your medications, are running low on pills, having trouble with transportation or  if you are gaining weight quickly, having significant swelling, trouble breathing, chest pain, dizziness or fainting spells.   Increase activity slowly   Complete by: As directed         Discharge Medications   Allergies as of 07/24/2021   No Known Allergies      Medication List     STOP taking these medications    tamsulosin 0.4 MG Caps capsule Commonly known as: FLOMAX       TAKE these medications    amLODipine 10 MG tablet Commonly known as: NORVASC Take 1 tablet (10 mg total) by mouth daily. Start taking on: July 25, 2021 What changed:  medication strength how much to take   aspirin 81 MG EC tablet Take 1 tablet (81 mg total) by mouth daily. Swallow whole.   atorvastatin 80 MG tablet Commonly known as: LIPITOR Take 1 tablet (80 mg total) by mouth daily.   carvedilol 12.5 MG tablet Commonly known as: COREG Take 1 tablet (12.5 mg total) by mouth 2 (two) times daily with a meal.   cloNIDine 0.3 MG tablet Commonly known as: CATAPRES Take 1 tablet (0.3 mg total) by mouth 2 (two) times daily.   empagliflozin 10 MG Tabs tablet Commonly known as: Jardiance Take 1 tablet (10 mg total) by mouth daily before breakfast.   furosemide 40 MG tablet Commonly known as: LASIX Take 1 tablet (40 mg total) by mouth daily.   gabapentin 100 MG capsule Commonly known as: NEURONTIN Take 1 capsule (100 mg total) by mouth 2 (two) times daily.   isosorbide mononitrate 60 MG 24 hr tablet Commonly known as: IMDUR Take 1 tablet (60 mg total) by mouth daily. Start taking on: July 25, 2021   nitroGLYCERIN 0.4 MG SL tablet Commonly known as: Nitrostat Place 1 tablet (0.4 mg total) under the tongue every 5 (five) minutes as needed for chest pain.   Pen Needles 31G X 5 MM Misc Use daily with insulin Dx E11.9   ticagrelor 90 MG Tabs tablet Commonly known as: BRILINTA Take 1 tablet (90 mg total) by mouth 2 (two) times daily.   [name] FlexTouch 100 UNIT/ML FlexTouch Pen Generic drug: insulin degludec Inject 38-46 units daily as directed (increase by 1 unit every other day until sugar is below 150) What changed:  how to take this when to  take this           Outstanding Labs/Studies   BMP 07/27/21  Duration of Discharge Encounter   Greater than 30 minutes including physician time.  Signed, Margie Billet, NP 07/24/2021, 12:13 PM

## 2021-07-24 NOTE — Progress Notes (Signed)
BMP ordered for 07/27/21, to be obtained at [facility], follow up with Dr Percival Spanish

## 2021-07-27 ENCOUNTER — Other Ambulatory Visit: Payer: PPO

## 2021-07-27 ENCOUNTER — Other Ambulatory Visit: Payer: Self-pay

## 2021-07-27 ENCOUNTER — Ambulatory Visit: Payer: PPO | Admitting: Family Medicine

## 2021-07-27 DIAGNOSIS — E1129 Type 2 diabetes mellitus with other diabetic kidney complication: Secondary | ICD-10-CM | POA: Diagnosis not present

## 2021-07-27 DIAGNOSIS — E1122 Type 2 diabetes mellitus with diabetic chronic kidney disease: Secondary | ICD-10-CM | POA: Diagnosis not present

## 2021-07-27 DIAGNOSIS — R600 Localized edema: Secondary | ICD-10-CM | POA: Diagnosis not present

## 2021-07-27 DIAGNOSIS — I129 Hypertensive chronic kidney disease with stage 1 through stage 4 chronic kidney disease, or unspecified chronic kidney disease: Secondary | ICD-10-CM | POA: Diagnosis not present

## 2021-07-27 DIAGNOSIS — Z5189 Encounter for other specified aftercare: Secondary | ICD-10-CM

## 2021-07-27 DIAGNOSIS — N189 Chronic kidney disease, unspecified: Secondary | ICD-10-CM | POA: Diagnosis not present

## 2021-07-27 DIAGNOSIS — R808 Other proteinuria: Secondary | ICD-10-CM | POA: Diagnosis not present

## 2021-07-27 DIAGNOSIS — R809 Proteinuria, unspecified: Secondary | ICD-10-CM | POA: Diagnosis not present

## 2021-07-27 DIAGNOSIS — D631 Anemia in chronic kidney disease: Secondary | ICD-10-CM | POA: Diagnosis not present

## 2021-07-28 ENCOUNTER — Telehealth: Payer: Self-pay | Admitting: *Deleted

## 2021-07-28 ENCOUNTER — Encounter: Payer: Self-pay | Admitting: Podiatry

## 2021-07-28 ENCOUNTER — Ambulatory Visit (INDEPENDENT_AMBULATORY_CARE_PROVIDER_SITE_OTHER): Payer: PPO | Admitting: *Deleted

## 2021-07-28 ENCOUNTER — Ambulatory Visit: Payer: PPO | Admitting: Podiatry

## 2021-07-28 DIAGNOSIS — E1149 Type 2 diabetes mellitus with other diabetic neurological complication: Secondary | ICD-10-CM | POA: Diagnosis not present

## 2021-07-28 DIAGNOSIS — M79674 Pain in right toe(s): Secondary | ICD-10-CM

## 2021-07-28 DIAGNOSIS — Z89419 Acquired absence of unspecified great toe: Secondary | ICD-10-CM | POA: Diagnosis not present

## 2021-07-28 DIAGNOSIS — M79675 Pain in left toe(s): Secondary | ICD-10-CM

## 2021-07-28 DIAGNOSIS — M2041 Other hammer toe(s) (acquired), right foot: Secondary | ICD-10-CM | POA: Diagnosis not present

## 2021-07-28 DIAGNOSIS — B351 Tinea unguium: Secondary | ICD-10-CM | POA: Diagnosis not present

## 2021-07-28 DIAGNOSIS — M2042 Other hammer toe(s) (acquired), left foot: Secondary | ICD-10-CM

## 2021-07-28 DIAGNOSIS — E119 Type 2 diabetes mellitus without complications: Secondary | ICD-10-CM

## 2021-07-28 LAB — BASIC METABOLIC PANEL
BUN/Creatinine Ratio: 14 (ref 10–24)
BUN: 32 mg/dL — ABNORMAL HIGH (ref 8–27)
CO2: 24 mmol/L (ref 20–29)
Calcium: 9.1 mg/dL (ref 8.6–10.2)
Chloride: 99 mmol/L (ref 96–106)
Creatinine, Ser: 2.28 mg/dL — ABNORMAL HIGH (ref 0.76–1.27)
Glucose: 200 mg/dL — ABNORMAL HIGH (ref 65–99)
Potassium: 4.7 mmol/L (ref 3.5–5.2)
Sodium: 138 mmol/L (ref 134–144)
eGFR: 31 mL/min/{1.73_m2} — ABNORMAL LOW (ref >59)

## 2021-07-28 NOTE — Telephone Encounter (Signed)
Transition Care Management Follow-up Telephone Call Date of discharge and from where: 07/24/2021  [facility] How have you been since you were released from the hospital? "Doing fine" Any questions or concerns? No  Items Reviewed: Did the pt receive and understand the discharge instructions provided? Yes  Medications obtained and verified? Yes  Other? N/a Any new allergies since your discharge? No  Dietary orders reviewed? Yes Do you have support at home? Yes   Home Care and Equipment/Supplies: Were home health services ordered? no If so, what is the name of the agency? N/a Has the agency set up a time to come to the patient's home? not applicable Were any new equipment or medical supplies ordered?  N/a What is the name of the medical supply agency? N/a Were you able to get the supplies/equipment? not applicable Do you have any questions related to the use of the equipment or supplies? N/a  Functional Questionnaire: (I = Independent and D = Dependent) ADLs: I  Bathing/Dressing- I  Meal Prep- I  Eating- I  Maintaining continence- I  Transferring/Ambulation- I  Managing Meds- I  Follow up appointments reviewed:  PCP Hospital f/u appt confirmed? No   Specialist Hospital f/u appt confirmed? Yes  Scheduled to see Levell July on 08/03/21 @ 230 pm Are transportation arrangements needed? No  If their condition worsens, is the pt aware to call PCP or go to the Emergency Dept.? Yes Was the patient provided with contact information for the PCP's office or ED? Yes Was to pt encouraged to call back with questions or concerns? Yes   Jacqlyn Larsen [facility], BSN RN Case Manager [phone]

## 2021-07-28 NOTE — Progress Notes (Signed)
Subjective: 65 year old male presents the office today with his wife for diabetic foot exam and diabetic shoe measurements.  He states he tried trimming his nail today on his left big toe and he did cut the skin some.  He does not see any swelling or redness or any drainage.  No other ulcerations.  No other concerns today.  States his blood sugars have been doing good but last couple days its been in the 200s.  Objective: AAO x3, NAD DP/PT pulses palpable bilaterally, CRT less than 3 seconds Sensation decreased with Semmes Weinstein monofilament Status post partial potation of right hallux .  Nails are hypertrophic, dystrophic with yellow-brown discoloration to nails 1 through 5 on the left and 2 through 5 on the right.  Superficial abrasion on the left hallux distally from where he cut himself.  No signs of infection swelling redness or any drainage.  Macerated interspace on the right fourth and third interspace with any skin breakdown or any drainage. Hammertoes present  No pain with calf compression, swelling, warmth, erythema  Assessment: 65 year old male with diabetic foot exam, symptomatic onychomycosis  Plan: -All treatment options discussed with the patient including all alternatives, risks, complications.  -Sharply debrided the nails x9 without any complications or bleeding -Recommend a small amount of antibiotic ointment to the left hallux toe distally.  Monitor for signs or signs of infection.  I applied cast times pain to the right third and fourth interspaces.  Discussed drying thoroughly between the toes and monitor for any skin breakdown or any signs of infection. -Glucose control -Daily foot inspection.  -Patient encouraged to call the office with any questions, concerns, change in symptoms.

## 2021-07-28 NOTE — Progress Notes (Signed)
Patient presents to the office today for diabetic shoe and insole measuring.  Patient was measured with brannock device to determine size and width for 1 pair of extra depth shoes and foam casted for 3 pair of insoles.   Documentation of medical necessity will be sent to patient's treating diabetic doctor to verify and sign.   Patient's diabetic provider: Ronnie Doss (Dr. Vonna Kotyk [place])  Shoes and insoles will be ordered at that time and patient will be notified for an appointment for fitting when they arrive.   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

## 2021-07-28 NOTE — Patient Instructions (Signed)

## 2021-07-29 ENCOUNTER — Ambulatory Visit (INDEPENDENT_AMBULATORY_CARE_PROVIDER_SITE_OTHER): Payer: PPO | Admitting: *Deleted

## 2021-07-29 DIAGNOSIS — I152 Hypertension secondary to endocrine disorders: Secondary | ICD-10-CM

## 2021-07-29 DIAGNOSIS — I2510 Atherosclerotic heart disease of native coronary artery without angina pectoris: Secondary | ICD-10-CM

## 2021-07-29 DIAGNOSIS — E1159 Type 2 diabetes mellitus with other circulatory complications: Secondary | ICD-10-CM

## 2021-07-29 NOTE — Patient Instructions (Addendum)
Visit Information  PATIENT GOALS:  Goals Addressed             This Visit's Progress    Manage CAD   On track    Timeframe:  Long-Range Goal Priority:  High Start Date:   07/01/21                          Expected End Date:                       Follow-up: 08/12/21  Keep all medical appointments Take all medications as prescribed Take nitroglycerin as needed for chest pain Nitro can cause a headache, lowered blood pressure, and lightheadedness Nitro can be taken 3 times, 5 minutes a part for chest pain that doesn't go away If chest pain is not gone after the 2nd Nitro, call 911 and take the 3rd dose Eat plenty of whole foods: lean proteins, vegetables, low glycemic fruits. Avoid sugars, simple carbs, limit sodium/salt Increase physical activity level with a goal of at least 150 minutes a week Seek appropriate medical attention for any new or worsening symptoms        Patient verbalizes understanding of instructions provided today and agrees to view in [place].    Plan:Telephone follow up appointment with care management team member scheduled for:  08/12/21 with RNCM and The patient has been provided with contact information for the care management team and has been advised to call with any health related questions or concerns.   Chong Sicilian, BSN, RN-BC Embedded Chronic Care Manager [facility] / [place] Management Direct Dial: [phone]

## 2021-07-29 NOTE — Chronic Care Management (AMB) (Signed)
Chronic Care Management   CCM RN Visit Note  07/29/2021 Name: [name] MRN: [id number] DOB: 13-Jan-1956  Subjective: [name] is a 65 y.o. year old male who is a primary care patient of [name], DO. The care management team was consulted for assistance with disease management and care coordination needs.    Engaged with patient by telephone for follow up visit in response to provider referral for case management and/or care coordination services.   Consent to Services:  The patient was given information about Chronic Care Management services, agreed to services, and gave verbal consent prior to initiation of services.  Please see initial visit note for detailed documentation.   Patient agreed to services and verbal consent obtained.   Assessment: Review of patient past medical history, allergies, medications, health status, including review of consultants reports, laboratory and other test data, was performed as part of comprehensive evaluation and provision of chronic care management services.   SDOH (Social Determinants of Health) assessments and interventions performed:    CCM Care Plan  No Known Allergies  Outpatient Encounter Medications as of 07/29/2021  Medication Sig Note   amLODipine (NORVASC) 10 MG tablet Take 1 tablet (10 mg total) by mouth daily.    aspirin 81 MG EC tablet Take 1 tablet (81 mg total) by mouth daily. Swallow whole.    atorvastatin (LIPITOR) 80 MG tablet Take 1 tablet (80 mg total) by mouth daily.    carvedilol (COREG) 12.5 MG tablet Take 1 tablet (12.5 mg total) by mouth 2 (two) times daily with a meal.    cloNIDine (CATAPRES) 0.3 MG tablet Take 1 tablet (0.3 mg total) by mouth 2 (two) times daily.    empagliflozin (JARDIANCE) 10 MG TABS tablet Take 1 tablet (10 mg total) by mouth daily before breakfast.    furosemide (LASIX) 40 MG tablet Take 1 tablet (40 mg total) by mouth daily.    gabapentin (NEURONTIN) 100 MG capsule  Take 1 capsule (100 mg total) by mouth 2 (two) times daily.    insulin degludec (TRESIBA FLEXTOUCH) 100 UNIT/ML FlexTouch Pen Inject 38-46 units daily as directed (increase by 1 unit every other day until sugar is below 150) (Patient taking differently: Inject into the skin daily. Inject 38-46 units daily as directed (increase by 1 unit every other day until sugar is below 150)) 07/19/2021: Pt used 40 units yesturday   Insulin Pen Needle (PEN NEEDLES) 31G X 5 MM MISC Use daily with insulin Dx E11.9    isosorbide mononitrate (IMDUR) 60 MG 24 hr tablet Take 1 tablet (60 mg total) by mouth daily.    nitroGLYCERIN (NITROSTAT) 0.4 MG SL tablet Place 1 tablet (0.4 mg total) under the tongue every 5 (five) minutes as needed for chest pain.    ticagrelor (BRILINTA) 90 MG TABS tablet Take 1 tablet (90 mg total) by mouth 2 (two) times daily.    No facility-administered encounter medications on file as of 07/29/2021.    Patient Active Problem List   Diagnosis Date Noted   Coronary artery disease involving native coronary artery of native heart with angina pectoris (HCC)    Congestive heart failure (CHF) ([place]) 07/19/2021   Acute combined systolic and diastolic heart failure ([place]) 07/19/2021   CAD, multiple vessel 06/25/2021   Elevated troponin    DM (diabetes mellitus) type II, controlled, with peripheral vascular disorder (HCC)    Chronic kidney disease (CKD) stage G3b/A2, moderately decreased glomerular filtration rate (GFR) between 30-44 mL/min/1.73 square  meter and albuminuria creatinine ratio between 30-299 mg/g (HCC)    Unstable angina ([place]) 06/07/2021   NSTEMI (non-ST elevated myocardial infarction) ([place]) 06/07/2021   Chest pain 04/07/2021   Personal history of diabetic foot ulcer 06/18/2020   History of amputation of great toe ([place]) 06/18/2020   Osteomyelitis of great toe of right foot ([address] [place])    Diabetic ulcer of toe of right foot associated with diabetes mellitus due to underlying condition, with  necrosis of bone ([place])    Osteomyelitis ([place]) 05/15/2020   AKI (acute kidney injury) ([place]) 05/15/2020   OSA (obstructive sleep apnea) 05/15/2020   Acute kidney failure, unspecified ([place]) 05/15/2020   Cellulitis of right foot 05/07/2020   Type 2 diabetes mellitus ([place]) 05/07/2020   Cellulitis of right lower extremity    Diabetic infection of right foot ([employer])    Cellulitis of great toe, right    Diabetic ulcer of toe of right foot associated with type 2 diabetes mellitus, with fat layer exposed ([place]) 04/15/2020   OSA on CPAP 09/01/2018   S/P lumbar fusion 07/06/2018   DDD (degenerative disc disease), lumbar 02/28/2018   Hypertension associated with diabetes ([place]) 02/23/2017   Benign prostatic hyperplasia with incomplete bladder emptying 02/23/2017   Vitamin D deficiency 02/23/2017   Hyperlipidemia associated with type 2 diabetes mellitus ([place]) 02/23/2017   Type 2 diabetes mellitus with stage 2 chronic kidney disease, without long-term current use of insulin ([place]) 02/23/2017   Hyperlipidemia, unspecified 02/23/2017   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    Conditions to be addressed/monitored:CAD and HTN  Care Plan : [facility] Care Plan  Updates made by Ilean China, RN since 07/29/2021 12:00 AM     Problem: Chronic Disease Management Needs   Priority: Medium     Long-Range Goal: Patient will Work with [facility] Regarding Chronic Care Management and Care Coordination Associated with HTN, DM, and CAD   Recent Progress: On track  Priority: Medium  Note:   Current Barriers:  Knowledge Deficits related to plan of care for management of CAD  Chronic Disease Management support and education needs related to CAD, HTN, and DMII  RNCM Clinical Goal(s):  Patient will verbalize understanding of plan for management of CAD take all medications exactly as prescribed and will call provider for medication related questions continue to work with RN Care Manager to address care management  and care coordination needs related to CAD, HTN, and DMII  through collaboration with RN Care manager, provider, and care team.   Interventions: 1:1 collaboration with primary care provider regarding development and update of comprehensive plan of care as evidenced by provider attestation and co-signature Inter-disciplinary care team collaboration (see longitudinal plan of care) Evaluation of current treatment plan related to  self management and patient's adherence to plan as established by provider Medications reviewed and importance of compliance discussed Provided with RNCM contact number and encouraged to reach out as needed   CAD  (Status: Goal on track: YES.) Medications reviewed including medications utilized in CAD treatment plan Provided education on importance of blood pressure control in management of CAD; Advised to report any changes in symptoms or exercise tolerance Discussed use of sublingual nitroglycerin for chest pain and frequency of need Reviewed education on how to store and take nitro Therapeutic listening utilized regarding recent heart attack, ED visit, and hospital stay Personal symptoms of recent heart attack reviewed. Discussed s/s to monitor for. Verbal education provided on when to see appropriate medical attention for chest pain, shortness  of breath, and any other s/s of a heart attack Discussed family/social support Discussed current physical activity level and ability to perform ADLs Feeling better. Was bailing hay when I called.  Reviewed upcoming appointments Nephrology and cardiology within the next week Encouraged to schedule Hosp f/u with PCP   Diabetes:  (Status: Condition stable. Not addressed this visit.) Lab Results  Component Value Date   HGBA1C 6.5 (H) 06/08/2021   HGBA1C 7.1 (H) 03/30/2021   HGBA1C 6.9 11/28/2020   Lab Results  Component Value Date   LDLCALC 61 07/21/2021   CREATININE 2.28 (H) 07/27/2021  Assessed patient's  understanding of A1c goal: <7% Reinforced need to check and record blood pressure daily Advised patient to bring blood pressure log to PCP and nephrology visits Advised patient to call PCP or nephrologist with any readings outside of recommended range Reviewed and discussed upcoming appointment with cardiologist Encouraged patient to seek medical attention for any new or worsening symptoms Encouraged patient to Call [facility] as needed   Hypertension: (Status: Condition stable. Not addressed this visit.) Last practice recorded BP readings:  BP Readings from Last 3 Encounters:  07/24/21 135/61  07/13/21 (!) 182/69  07/03/21 (!) 153/72    Patient Goals/Self-Care Activities: Patient will self administer medications as prescribed Patient will attend all scheduled provider appointments Patient will call pharmacy for medication refills Patient will continue to perform ADL's independently Patient will continue to perform IADL's independently Patient will call provider office for new concerns or questions       Plan:Telephone follow up appointment with care management team member scheduled for:  08/12/21 with RNCM and The patient has been provided with contact information for the care management team and has been advised to call with any health related questions or concerns.   Chong Sicilian, BSN, RN-BC Embedded Chronic Care Manager [facility] / [place] Management Direct Dial: [phone]

## 2021-07-30 DIAGNOSIS — N19 Unspecified kidney failure: Secondary | ICD-10-CM | POA: Diagnosis not present

## 2021-07-30 DIAGNOSIS — I129 Hypertensive chronic kidney disease with stage 1 through stage 4 chronic kidney disease, or unspecified chronic kidney disease: Secondary | ICD-10-CM | POA: Diagnosis not present

## 2021-07-30 DIAGNOSIS — E1122 Type 2 diabetes mellitus with diabetic chronic kidney disease: Secondary | ICD-10-CM | POA: Diagnosis not present

## 2021-07-30 DIAGNOSIS — N170 Acute kidney failure with tubular necrosis: Secondary | ICD-10-CM | POA: Diagnosis not present

## 2021-07-30 DIAGNOSIS — N189 Chronic kidney disease, unspecified: Secondary | ICD-10-CM | POA: Diagnosis not present

## 2021-07-30 DIAGNOSIS — R808 Other proteinuria: Secondary | ICD-10-CM | POA: Diagnosis not present

## 2021-07-30 DIAGNOSIS — E1129 Type 2 diabetes mellitus with other diabetic kidney complication: Secondary | ICD-10-CM | POA: Diagnosis not present

## 2021-07-30 DIAGNOSIS — D631 Anemia in chronic kidney disease: Secondary | ICD-10-CM | POA: Diagnosis not present

## 2021-07-30 DIAGNOSIS — R809 Proteinuria, unspecified: Secondary | ICD-10-CM | POA: Diagnosis not present

## 2021-08-02 NOTE — Progress Notes (Signed)
Cardiology Office Note  Date: 08/03/2021   ID: [name] [birth year], MRN [id number]  PCP:  [name], DO  Cardiologist:  Minus Breeding, MD Electrophysiologist:  None   Chief Complaint: Hospital follow up  History of Present Illness: [name] is a 65 y.o. male with a history of acute combined systolic and diastolic heart failure, CAD, DM2, HTN, NSTEMI, CKD.  Status post NSTEMI on 06/07/2021 with presentation of chest pain to an outside emergency department with left-sided substernal chest pain which started on the morning of 06/07/2021.  Pain was radiating down his left arm.  He was having some diaphoresis, nausea but no vomiting or lightheadedness.  He was eventually transferred to [facility] for further management.  Underwent cardiac catheterization.  Left heart cath on 06/09/2021 demonstrated severe three-vessel CAD with 20% ostial P LAD stenosis, 50% M LAD stenosis, 85% mid LAD stenosis, 90% D2 stenosis, 65% ostial-PL CX stenosis, 90% DL CX stenosis, 95% OM1 stenosis, 30% P RCA stenosis, 90% second RPL stenosis.  Culprit lesion felt to be OM1.  He was recommended to undergo staged PCI/DES to LCx/OM1 bifurcation.  His other disease was not felt to be amenable to PCI/CABG and was treated medically.  He was taken back to the Cath Lab on 06/11/2021 with successful bifurcation PCI/DES x2 mid LCx and OM1.  He was started on DAPT therapy with aspirin and Brilinta x1 year   Recent admission on 07/19/2021 with presentation to ED for worsening dyspnea on exertion, lower extremity edema and orthopnea.  He was noted to be hypertensive with mild leukocytosis, creatinine of 1.8, BNP 800.  Mildly elevated troponin with minimal delta.  Chest x-ray with evidence of pulmonary edema.  He was given Lasix in the emergency department with good urinary output.  He was transported to [facility] for ongoing management with cardiology.  He had a left heart cath on 07/04/2021  demonstrating circumflex obtuse marginal branch bifurcation stent was widely patent.  Remainder of his anatomy was unchanged.  He had a moderate mid LAD lesion which was hypodense but similar to previous catheterization in July 2022.  He was to continue DAPT therapy with aspirin 81 mg daily and Brilinta 90 mg p.o. twice daily, amlodipine 10 mg daily, Coreg 12.5 mg p.o. twice daily, Imdur 60 mg daily, atorvastatin 80 mg daily.  He has acute diastolic CHF was treated with IV Lasix and he was down to 6.3 L after diuresis with weight down from 207 to 196 pounds.  Plan to avoid ACE/ARB in setting of CKD 3B.  He was to continue [place].  His blood pressure had been overall improved with med titration was blood pressure of 135/61 on day of discharge.  His amlodipine was increased to 10 mg.  Coreg was started at 12.5 mg p.o. twice daily.  Imdur was started and 60 mg daily and he continued clonidine 0.3 mg p.o. twice daily.  Lasix has been added 40 mg daily during admission.  His LDL was at goal at 61.  He was started on atorvastatin 80 mg daily.   Past Medical History:  Diagnosis Date   Chronic kidney disease    Complication of anesthesia    Hard to wake   Degenerative joint disease (DJD) of lumbar spine    Diabetes mellitus without complication ([place])    Type II   Essential hypertension 02/23/2017   Lumbar stenosis    NSTEMI (non-ST elevated myocardial infarction) ([place]) 06/07/2021    Past  Surgical History:  Procedure Laterality Date   AMPUTATION TOE Right 05/17/2020   Procedure: AMPUTATION TOE partial right great toe;  Surgeon: Evelina Bucy, DPM;  Location: [facility];  Service: Podiatry;  Laterality: Right;   BELPHAROPTOSIS REPAIR     CARDIAC CATHETERIZATION     CORONARY STENT INTERVENTION N/A 06/11/2021   Procedure: CORONARY STENT INTERVENTION;  Surgeon: Martinique, Peter M, MD;  Location: [place] [facility];  Service: Cardiovascular;  Laterality: N/A;   EYE SURGERY     HEMORRHOID SURGERY      INTRAVASCULAR ULTRASOUND/IVUS N/A 06/11/2021   Procedure: Intravascular Ultrasound/IVUS;  Surgeon: Martinique, Peter M, MD;  Location: [place] [facility];  Service: Cardiovascular;  Laterality: N/A;   LEFT HEART CATH AND CORONARY ANGIOGRAPHY N/A 06/09/2021   Procedure: LEFT HEART CATH AND CORONARY ANGIOGRAPHY;  Surgeon: Martinique, Peter M, MD;  Location: [place] [facility];  Service: Cardiovascular;  Laterality: N/A;   LEFT HEART CATH AND CORONARY ANGIOGRAPHY N/A 07/23/2021   Procedure: LEFT HEART CATH AND CORONARY ANGIOGRAPHY;  Surgeon: Lorretta Harp, MD;  Location: [place] [facility];  Service: Cardiovascular;  Laterality: N/A;   skin cancer removed right ear     TRANSFORAMINAL LUMBAR INTERBODY FUSION (TLIF) WITH PEDICLE SCREW FIXATION 2 LEVEL N/A 07/06/2018   Procedure: TRANSFORAMINAL LUMBAR INTERBODY FUSION (TLIF) L4-S1;  Surgeon: Melina Schools, MD;  Location: [place];  Service: Orthopedics;  Laterality: N/A;    Current Outpatient Medications  Medication Sig Dispense Refill   amLODipine (NORVASC) 10 MG tablet Take 1 tablet (10 mg total) by mouth daily. 90 tablet 1   aspirin 81 MG EC tablet Take 1 tablet (81 mg total) by mouth daily. Swallow whole. 90 tablet 3   atorvastatin (LIPITOR) 80 MG tablet Take 1 tablet (80 mg total) by mouth daily. 90 tablet 1   carvedilol (COREG) 12.5 MG tablet Take 1 tablet (12.5 mg total) by mouth 2 (two) times daily with a meal. 60 tablet 2   cloNIDine (CATAPRES) 0.3 MG tablet Take 1 tablet (0.3 mg total) by mouth 2 (two) times daily. 180 tablet 3   empagliflozin (JARDIANCE) 10 MG TABS tablet Take 1 tablet (10 mg total) by mouth daily before breakfast. 30 tablet 6   furosemide (LASIX) 40 MG tablet Take 1 tablet (40 mg total) by mouth daily. 30 tablet 1   gabapentin (NEURONTIN) 100 MG capsule Take 1 capsule (100 mg total) by mouth 2 (two) times daily. 180 capsule 2   insulin degludec (TRESIBA FLEXTOUCH) 100 UNIT/ML FlexTouch Pen Inject 38-46 units daily as directed  (increase by 1 unit every other day until sugar is below 150) (Patient taking differently: Inject into the skin daily. Inject 38-46 units daily as directed (increase by 1 unit every other day until sugar is below 150)) 15 mL 1   Insulin Pen Needle (PEN NEEDLES) 31G X 5 MM MISC Use daily with insulin Dx E11.9 100 each 3   isosorbide mononitrate (IMDUR) 60 MG 24 hr tablet Take 1 tablet (60 mg total) by mouth daily. 90 tablet 1   nitroGLYCERIN (NITROSTAT) 0.4 MG SL tablet Place 1 tablet (0.4 mg total) under the tongue every 5 (five) minutes as needed for chest pain. 25 tablet 3   ticagrelor (BRILINTA) 90 MG TABS tablet Take 1 tablet (90 mg total) by mouth 2 (two) times daily. 180 tablet 3   No current facility-administered medications for this visit.   Allergies:  Patient has no known allergies.   Social History: The  patient  reports that he has never smoked. He has never used smokeless tobacco. He reports current alcohol use of about 6.0 standard drinks per week. He reports that he does not use drugs.   Family History: The patient's family history includes Cancer in his brother; Diabetes in his brother; Heart attack (age of onset: 1) in his father; Hypertension in his father; Vision loss in his sister.   ROS:  Please see the history of present illness. Otherwise, complete review of systems is positive for none.  All other systems are reviewed and negative.   Physical Exam: VS:  BP (!) 151/76   Pulse (!) 54   Ht 6' (1.829 m)   Wt 201 lb (91.2 kg)   SpO2 98%   BMI 27.26 kg/m , BMI Body mass index is 27.26 kg/m.  Wt Readings from Last 3 Encounters:  08/03/21 201 lb (91.2 kg)  07/24/21 196 lb 9.6 oz (89.2 kg)  07/03/21 201 lb 3.2 oz (91.3 kg)    General: Patient appears comfortable at rest. Neck: Supple, no elevated JVP or carotid bruits, no thyromegaly. Lungs: Clear to auscultation, nonlabored breathing at rest. Cardiac: Regular rate and rhythm, no S3 or significant systolic murmur, no  pericardial rub. Extremities: No pitting edema, distal pulses 2+. Skin: Warm and dry.  Right radial access site clean and dry without redness swelling, 2+ pulse Musculoskeletal: No kyphosis. Neuropsychiatric: Alert and oriented x3, affect grossly appropriate.  ECG: August 03, 2021 EKG sinus bradycardia with first-degree AV block rate of 54  Recent Labwork: 07/19/2021: B Natriuretic Peptide 802.0 07/21/2021: ALT 11; AST 15; Magnesium 2.0 07/24/2021: Hemoglobin 8.6; Platelets 263 07/27/2021: BUN 32; Creatinine, Ser 2.28; Potassium 4.7; Sodium 138     Component Value Date/Time   CHOL 110 07/21/2021 0348   CHOL 138 10/01/2019 1133   TRIG 120 07/21/2021 0348   HDL 25 (L) 07/21/2021 0348   HDL 37 (L) 10/01/2019 1133   CHOLHDL 4.4 07/21/2021 0348   VLDL 24 07/21/2021 0348   LDLCALC 61 07/21/2021 0348   LDLCALC 71 10/01/2019 1133    Other Studies Reviewed Today:  LHC from 07/23/21:     Ost LAD to Prox LAD lesion is 20% stenosed.   Mid LAD-1 lesion is 50% stenosed.   Dist LAD lesion is 85% stenosed.   Dist Cx lesion is 90% stenosed.   Prox RCA lesion is 30% stenosed.   2nd Diag lesion is 90% stenosed.   2nd RPL lesion is 90% stenosed.   Prox LAD to Mid LAD lesion is 60% stenosed.   Mid LAD-2 lesion is 60% stenosed.   Non-stenotic Ost Cx to Prox Cx lesion was previously treated.   Non-stenotic 1st Mrg lesion was previously treated.   IMPRESSION: Mr. Kinker anatomy is unchanged from his most recent intervention in July.  His circumflex obtuse marginal branch bifurcation stent was widely patent.  The remainder of his anatomy was unchanged.  He does have a moderate mid LAD lesion which is hypodense but similar angiographically to what it was during his recent catheterization.  His symptoms are somewhat atypical and last for hours.  His troponins are low and flat.  I do not think his symptoms are ischemically mediated.  Medical therapy will be recommended.  The sheath was removed and a TR  band was placed on the right wrist to achieve patent hemostasis.  The patient left lab in stable condition.   Echo from 07/20/21:   1. Left ventricular ejection fraction, by estimation, is  55 to 60%. The  left ventricle has normal function. The left ventricle has no regional  wall motion abnormalities. There is mild left ventricular hypertrophy.  Left ventricular diastolic parameters  are indeterminate.   2. Right ventricular systolic function is normal. The right ventricular  size is normal. Tricuspid regurgitation signal is inadequate for assessing  PA pressure.   3. Left atrial size was mildly dilated.   4. The inferior vena cava is normal in size with greater than 50%  respiratory variability, suggesting right atrial pressure of 3 mmHg.   5. Limited echo with no color doppler.      CORONARY STENT INTERVENTION   06/11/2021  Intravascular Ultrasound/IVUS   Conclusion      1st Mrg lesion is 95% stenosed.   A drug-eluting stent was successfully placed using a STENT ONYX FRONTIER 2.75X26.   Post intervention, there is a 0% residual stenosis.   Ost Cx to Prox Cx lesion is 65% stenosed.   A drug-eluting stent was successfully placed using a STENT ONYX FRONTIER 3.0X22.   Post intervention, there is a 0% residual stenosis.   Successful Bifurcation stenting of the proximal to mid LCx and first OM using IVUS guidance and [place] technique with DES x 2   Plan: DAPT for one year. Aggressive risk factor modification. Will treat his other disease medically.  Diagnostic Dominance: Right Intervention       Echocardiogram 06/08/2021   1. Left ventricular ejection fraction, by estimation, is 60 to 65%. The left ventricle has normal function. The left ventricle has no regional wall motion abnormalities. There is mild left ventricular hypertrophy. Left ventricular diastolic parameters are consistent with Grade I diastolic dysfunction (impaired relaxation). 2. Right ventricular systolic  function is normal. The right ventricular size is normal. There is normal pulmonary artery systolic pressure. The estimated right ventricular systolic pressure is 67.5 mmHg. 3. The mitral valve is abnormal. Trivial mitral valve regurgitation. Mild mitral stenosis. The mean mitral valve gradient is 6.0 mmHg. Moderate mitral annular calcification. 4. The aortic valve is tricuspid. Aortic valve regurgitation is not visualized. Mild aortic valve sclerosis is present, with no evidence of aortic valve stenosis. 5. The inferior vena cava is normal in size with greater than 50% respiratory variability, suggesting right atrial pressure of 3 mmHg.    Assessment and Plan:  1. Chronic combined systolic (congestive) and diastolic (congestive) heart failure ([place])   2. CAD in native artery   3. Essential hypertension   4. Mixed hyperlipidemia   5. Stage 3b chronic kidney disease ([place])   6. Type 2 diabetes mellitus with hyperglycemia, with long-term current use of insulin (H[facility])    1. Chronic combined systolic (congestive) and diastolic (congestive) heart failure ([facility]) Currently denies any shortness of breath or DOE.  No weight gain or lower extremity edema.  Weight is staying around 201.  Continue carvedilol 12.5 mg p.o. twice daily.  Continue Lasix 40 mg p.o. daily.  Continue Jardiance 10 mg p.o., Imdur 60 mg daily daily.  Advised to continue sodium restriction, fluid restrictions and weighing himself every day.  Advised compliance with heart failure therapy.  2. CAD in native artery Recent PCI to LCx OM1 on 06/11/2021.  Residual disease was treated medically.  He had a subsequent cardiac catheterization on 07/23/2021 which showed  anatomy unchanged from most recent intervention in July.  Medical therapy was recommended.  Continue aspirin 81 mg daily, Brilinta 90 mg p.o. twice daily, Coreg 12.5 mg p.o. twice daily, sublingual nitroglycerin as  needed, Imdur 60 mg daily, Jardiance 10 mg daily.  3. Essential  hypertension Blood pressure initially elevated at 151/76.  Recheck in right arm 130/70.  Continue carvedilol 12.5 mg p.o. twice daily.  Continue clonidine 0.3 mg p.o. twice daily.  Continue amlodipine 10 mg daily.  4. Mixed hyperlipidemia Continue atorvastatin 80 mg p.o. daily.  Lipid panel on 07/21/2021: TC 110, TG 120, HDL 25, LDL 61  5. Stage 3b chronic kidney disease [facility]) Sees nephrology.  He states his nephrologist was a little concerned about his anemia.  He states nephrologist mentioned his blood count was a little bit low.  Recent hemoglobin and hematocrit 07/27/2021 were 9.8 and 30.3 respectively. On 07/27/2021, creatinine 2.31 and GFR 30  6. Type 2 diabetes mellitus with hyperglycemia, with long-term current use of insulin (HCC) Last glucose on 07/24/2021 was 207.  Continue to follow-up diabetes with PCP.  Currently on Tresiba and Jardiance  Medication Adjustments/Labs and Tests Ordered: Current medicines are reviewed at length with the patient today.  Concerns regarding medicines are outlined above.   Disposition: Follow-up with Dr. Percival Spanish or APP 6 months  Signed, Levell July, NP 08/03/2021 3:47 PM    [facility] at [address], [place], [place] [address] Phone: [phone]; Fax: [phone]

## 2021-08-03 ENCOUNTER — Other Ambulatory Visit: Payer: Self-pay

## 2021-08-03 ENCOUNTER — Encounter: Payer: Self-pay | Admitting: Family Medicine

## 2021-08-03 ENCOUNTER — Ambulatory Visit: Payer: PPO | Admitting: Family Medicine

## 2021-08-03 VITALS — BP 151/76 | HR 54 | Ht 72.0 in | Wt 201.0 lb

## 2021-08-03 DIAGNOSIS — I10 Essential (primary) hypertension: Secondary | ICD-10-CM

## 2021-08-03 DIAGNOSIS — Z794 Long term (current) use of insulin: Secondary | ICD-10-CM

## 2021-08-03 DIAGNOSIS — N1832 Chronic kidney disease, stage 3b: Secondary | ICD-10-CM | POA: Diagnosis not present

## 2021-08-03 DIAGNOSIS — E1165 Type 2 diabetes mellitus with hyperglycemia: Secondary | ICD-10-CM

## 2021-08-03 DIAGNOSIS — I5042 Chronic combined systolic (congestive) and diastolic (congestive) heart failure: Secondary | ICD-10-CM | POA: Diagnosis not present

## 2021-08-03 DIAGNOSIS — I2510 Atherosclerotic heart disease of native coronary artery without angina pectoris: Secondary | ICD-10-CM

## 2021-08-03 DIAGNOSIS — E782 Mixed hyperlipidemia: Secondary | ICD-10-CM

## 2021-08-03 NOTE — Patient Instructions (Signed)
Medication Instructions:  Your physician recommends that you continue on your current medications as directed. Please refer to the Current Medication list given to you today.  *If you need a refill on your cardiac medications before your next appointment, please call your pharmacy*   Lab Work: NONE If you have labs (blood work) drawn today and your tests are completely normal, you will receive your results only by: [place] (if you have MyChart) OR A paper copy in the mail If you have any lab test that is abnormal or we need to change your treatment, we will call you to review the results.   Testing/Procedures: NONE   Follow-Up: At [facility], you and your health needs are our priority.  As part of our continuing mission to provide you with exceptional heart care, we have created designated Provider Care Teams.  These Care Teams include your primary Cardiologist (physician) and Advanced Practice Providers (APPs -  Physician Assistants and Nurse Practitioners) who all work together to provide you with the care you need, when you need it.  We recommend signing up for the patient portal called "MyChart".  Sign up information is provided on this After Visit Summary.  MyChart is used to connect with patients for Virtual Visits (Telemedicine).  Patients are able to view lab/test results, encounter notes, upcoming appointments, etc.  Non-urgent messages can be sent to your provider as well.   To learn more about what you can do with MyChart, go to [email or web address].    Your next appointment:   6 month(s)  The format for your next appointment:   In Person  Provider:   You may see Minus Breeding, MD or the following Advanced Practice Provider on your designated Care Team:   Katina Dung, NP

## 2021-08-05 ENCOUNTER — Ambulatory Visit: Payer: PPO | Admitting: Cardiology

## 2021-08-12 ENCOUNTER — Ambulatory Visit: Payer: PPO | Admitting: *Deleted

## 2021-08-12 DIAGNOSIS — I152 Hypertension secondary to endocrine disorders: Secondary | ICD-10-CM

## 2021-08-12 DIAGNOSIS — I2510 Atherosclerotic heart disease of native coronary artery without angina pectoris: Secondary | ICD-10-CM

## 2021-08-12 DIAGNOSIS — E1159 Type 2 diabetes mellitus with other circulatory complications: Secondary | ICD-10-CM

## 2021-08-14 DIAGNOSIS — E1159 Type 2 diabetes mellitus with other circulatory complications: Secondary | ICD-10-CM | POA: Diagnosis not present

## 2021-08-14 DIAGNOSIS — I152 Hypertension secondary to endocrine disorders: Secondary | ICD-10-CM

## 2021-08-14 DIAGNOSIS — I2510 Atherosclerotic heart disease of native coronary artery without angina pectoris: Secondary | ICD-10-CM | POA: Diagnosis not present

## 2021-08-17 ENCOUNTER — Other Ambulatory Visit: Payer: Self-pay | Admitting: Family Medicine

## 2021-08-18 ENCOUNTER — Ambulatory Visit (INDEPENDENT_AMBULATORY_CARE_PROVIDER_SITE_OTHER): Payer: PPO | Admitting: *Deleted

## 2021-08-18 DIAGNOSIS — N183 Chronic kidney disease, stage 3 unspecified: Secondary | ICD-10-CM

## 2021-08-18 DIAGNOSIS — E1159 Type 2 diabetes mellitus with other circulatory complications: Secondary | ICD-10-CM

## 2021-08-18 DIAGNOSIS — I152 Hypertension secondary to endocrine disorders: Secondary | ICD-10-CM

## 2021-08-18 DIAGNOSIS — I2510 Atherosclerotic heart disease of native coronary artery without angina pectoris: Secondary | ICD-10-CM

## 2021-08-18 DIAGNOSIS — N184 Chronic kidney disease, stage 4 (severe): Secondary | ICD-10-CM

## 2021-08-18 DIAGNOSIS — E1122 Type 2 diabetes mellitus with diabetic chronic kidney disease: Secondary | ICD-10-CM

## 2021-08-19 ENCOUNTER — Encounter: Payer: Self-pay | Admitting: *Deleted

## 2021-08-19 NOTE — Patient Instructions (Signed)
Visit Information  PATIENT GOALS:  Goals Addressed             This Visit's Progress    RNC: Monitor and Manage My Blood Sugar-Diabetes Type 2   On track    Timeframe:  Long-Range Goal Priority:  Medium Start Date:                             Expected End Date:                       Follow Up Date 09/02/21   check blood sugar at prescribed times check blood sugar if I feel it is too high or too low enter blood sugar readings and medication or insulin into daily log take the blood sugar log to all doctor visits take the blood sugar meter to all doctor visits  Call PCP with any readings outside of recommended range Eat a healthy diet. Limit sugar and simple carbohydrates.  Fill plate halfway with vegetables Eat lean meats Schedule an appt with eye doctor for diabetic eye exam Increase water intake Keep all medical appointments Remain physically active daily with a goal of at least 150 minutes of moderate physical activity Call Home as needed [phone]   Why is this important?   Checking your blood sugar at home helps to keep it from getting very high or very low.  Writing the results in a diary or log helps the doctor know how to care for you.  Your blood sugar log should have the time, date and the results.  Also, write down the amount of insulin or other medicine that you take.  Other information, like what you ate, exercise done and how you were feeling, will also be helpful.     Notes:      RNCM: Manage CAD   On track    Timeframe:  Long-Range Goal Priority:  High Start Date:   07/01/21                          Expected End Date:                       Follow-up: 08/28/21  Keep all medical appointments Take all medications as prescribed Take nitroglycerin as needed for chest pain Nitro can cause a headache, lowered blood pressure, and lightheadedness Nitro can be taken 3 times, 5 minutes a part for chest pain that doesn't go away If chest pain is  not gone after the 2nd Nitro, call 911 and take the 3rd dose Eat plenty of whole foods: lean proteins, vegetables, low glycemic fruits. Avoid sugars, simple carbs, limit sodium/salt Work on weight loss Increase physical activity level with a goal of at least 150 minutes a week Seek appropriate medical attention for any new or worsening symptoms     RNCM: Track and Manage My Blood Pressure-Hypertension   On track    Timeframe:  Long-Range Goal Priority:  Medium Start Date:                             Expected End Date:                       Follow Up Date 1019/2022   check blood pressure daily and vary the time of  day write blood pressure results in a log or diary  Take blood pressure log to PCP appointments Call PCP or nephrologist with any readings outside of recommended range Increase water intake Keep all medical appointments Call PCP for appt or seek medical attention for any new or worsening symptoms Take all medications as directed Call RN Care Manager as needed [phone]   Why is this important?   You won't feel high blood pressure, but it can still hurt your blood vessels.  High blood pressure can cause heart or kidney problems. It can also cause a stroke.  Making lifestyle changes like losing a little weight or eating less salt will help.  Checking your blood pressure at home and at different times of the day can help to control blood pressure.  If the doctor prescribes medicine remember to take it the way the doctor ordered.  Call the office if you cannot afford the medicine or if there are questions about it.     Notes:         Patient verbalizes understanding of instructions provided today and agrees to view in [place].    Plan:Telephone follow up appointment with care management team member scheduled for:  09/02/21 with RNCM and The patient has been provided with contact information for the care management team and has been advised to call with any health  related questions or concerns.   Chong Sicilian, BSN, RN-BC Embedded Chronic Care Manager [facility] Direct Dial: [phone]

## 2021-08-19 NOTE — Chronic Care Management (AMB) (Signed)
Chronic Care Management   CCM RN Visit Note  08/18/2021 Name: [name] MRN: [id number] DOB: 1956-10-01  Subjective: [name] is a 65 y.o. year old male who is a primary care patient of [name], DO. The care management team was consulted for assistance with disease management and care coordination needs.    Engaged with patient by telephone for follow up visit in response to provider referral for case management and/or care coordination services.   Consent to Services:  The patient was given information about Chronic Care Management services, agreed to services, and gave verbal consent prior to initiation of services.  Please see initial visit note for detailed documentation.   Patient agreed to services and verbal consent obtained.   Assessment: Review of patient past medical history, allergies, medications, health status, including review of consultants reports, laboratory and other test data, was performed as part of comprehensive evaluation and provision of chronic care management services.   SDOH (Social Determinants of Health) assessments and interventions performed:    CCM Care Plan  No Known Allergies  Outpatient Encounter Medications as of 08/18/2021  Medication Sig   amLODipine (NORVASC) 10 MG tablet Take 1 tablet (10 mg total) by mouth daily.   aspirin 81 MG EC tablet Take 1 tablet (81 mg total) by mouth daily. Swallow whole.   atorvastatin (LIPITOR) 80 MG tablet Take 1 tablet (80 mg total) by mouth daily.   carvedilol (COREG) 12.5 MG tablet Take 1 tablet (12.5 mg total) by mouth 2 (two) times daily with a meal.   cloNIDine (CATAPRES) 0.3 MG tablet Take 1 tablet (0.3 mg total) by mouth 2 (two) times daily.   empagliflozin (JARDIANCE) 10 MG TABS tablet Take 1 tablet (10 mg total) by mouth daily before breakfast.   furosemide (LASIX) 40 MG tablet Take 1 tablet (40 mg total) by mouth daily.   gabapentin (NEURONTIN) 100 MG capsule Take 1  capsule (100 mg total) by mouth 2 (two) times daily.   insulin degludec (TRESIBA FLEXTOUCH) 100 UNIT/ML FlexTouch Pen Inject 38-46 units daily as directed (increase by 1 unit every other day until sugar is below 150) (Patient taking differently: Inject into the skin daily. Inject 38-46 units daily as directed (increase by 1 unit every other day until sugar is below 150))   Insulin Pen Needle (PEN NEEDLES) 31G X 5 MM MISC Use daily with insulin Dx E11.9   isosorbide mononitrate (IMDUR) 60 MG 24 hr tablet Take 1 tablet (60 mg total) by mouth daily.   nitroGLYCERIN (NITROSTAT) 0.4 MG SL tablet Place 1 tablet (0.4 mg total) under the tongue every 5 (five) minutes as needed for chest pain.   ticagrelor (BRILINTA) 90 MG TABS tablet Take 1 tablet (90 mg total) by mouth 2 (two) times daily.   No facility-administered encounter medications on file as of 08/18/2021.    Patient Active Problem List   Diagnosis Date Noted   Morbid obesity ([place]) 08/19/2021   Coronary artery disease involving native coronary artery of native heart with angina pectoris (HCC)    Congestive heart failure (CHF) ([place]) 07/19/2021   Acute combined systolic and diastolic heart failure ([place]) 07/19/2021   CAD, multiple vessel 06/25/2021   Elevated troponin    DM (diabetes mellitus) type II, controlled, with peripheral vascular disorder (HCC)    Chronic kidney disease (CKD) stage G3b/A2, moderately decreased glomerular filtration rate (GFR) between 30-44 mL/min/1.73 square meter and albuminuria creatinine ratio between 30-299 mg/g (HCC)    Unstable  angina ([place]) 06/07/2021   NSTEMI (non-ST elevated myocardial infarction) ([place]) 06/07/2021   Chest pain 04/07/2021   Personal history of diabetic foot ulcer 06/18/2020   History of amputation of great toe ([place]) 06/18/2020   Osteomyelitis of great toe of right foot ([place])    Diabetic ulcer of toe of right foot associated with diabetes mellitus due to underlying condition, with necrosis of  bone ([place])    Osteomyelitis ([place]) 05/15/2020   AKI (acute kidney injury) ([place]) 05/15/2020   OSA (obstructive sleep apnea) 05/15/2020   Acute kidney failure, unspecified ([place]) 05/15/2020   Cellulitis of right foot 05/07/2020   Type 2 diabetes mellitus ([place]) 05/07/2020   Cellulitis of right lower extremity    Diabetic infection of right foot ([place])    Cellulitis of great toe, right    Diabetic ulcer of toe of right foot associated with type 2 diabetes mellitus, with fat layer exposed ([place]) 04/15/2020   OSA on CPAP 09/01/2018   S/P lumbar fusion 07/06/2018   DDD (degenerative disc disease), lumbar 02/28/2018   Hypertension associated with diabetes ([place]) 02/23/2017   Benign prostatic hyperplasia with incomplete bladder emptying 02/23/2017   Vitamin D deficiency 02/23/2017   Hyperlipidemia associated with type 2 diabetes mellitus ([place]) 02/23/2017   Type 2 diabetes mellitus with stage 2 chronic kidney disease, without long-term current use of insulin ([place]) 02/23/2017   Hyperlipidemia, unspecified 02/23/2017   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    Conditions to be addressed/monitored:HTN, DMII, and CKD Stage 3  Care Plan : [facility] Care Plan   Problem: Chronic Disease Management Needs   Priority: Medium     Long-Range Goal: Patient will Work with [facility] Regarding Chronic Care Management and Care Coordination Associated with HTN, DM, and CAD   This Visit's Progress: On track  Recent Progress: On track  Priority: Medium  Note:   Current Barriers:  Knowledge Deficits related to plan of care for management of CAD  Chronic Disease Management support and education needs related to CAD, HTN, DMII, and CKD Stage 3  RNCM Clinical Goal(s):  Patient will verbalize understanding of plan for management of CAD  take all medications exactly as prescribed and will call provider for medication related questions continue to work with RN Care Manager to address care management and care  coordination needs related to CAD, HTN, DMII, and CKD Stage 3  through collaboration with RN Care manager, provider, and care team.   Interventions: 1:1 collaboration with primary care provider regarding development and update of comprehensive plan of care as evidenced by provider attestation and co-signature Inter-disciplinary care team collaboration (see longitudinal plan of care) Evaluation of current treatment plan related to  self management and patient's adherence to plan as established by provider Medications reviewed and importance of compliance discussed.  Assessed for barriers to medication compliance Provided with RNCM contact number and encouraged to reach out as needed Referral and correspondence reviewed from [facility] physician   CAD  (Status: Goal on track: YES.) Medications reviewed including medications utilized in CAD treatment plan Provided education on importance of blood pressure control in management of CAD; Advised to report any changes in symptoms or exercise tolerance Discussed use of sublingual nitroglycerin for chest pain and frequency of need Reviewed education on how to store and take nitro Therapeutic listening utilized regarding recent heart attack, ED visit, and hospital stay Personal symptoms of recent heart attack reviewed. Discussed s/s to monitor for. Verbal education provided on when to see appropriate medical attention for  chest pain, shortness of breath, and any other s/s of a heart attack Discussed family/social support Discussed current physical activity level and ability to perform ADLs Feeling better. Was bailing hay when I called.  Reviewed upcoming appointments   Diabetes:  (Status: Condition stable. Not addressed this visit.) Lab Results  Component Value Date   HGBA1C 6.5 (H) 06/08/2021   HGBA1C 7.1 (H) 03/30/2021   HGBA1C 6.9 11/28/2020   Lab Results  Component Value Date   LDLCALC 61 07/21/2021   CREATININE 2.28 (H) 07/27/2021   Assessed patient's understanding of A1c goal: <7% Reinforced need to check and record blood pressure daily Advised patient to bring blood pressure log to PCP and nephrology visits Advised patient to call PCP or nephrologist with any readings outside of recommended range Reviewed and discussed upcoming appointment with cardiologist Encouraged patient to seek medical attention for any new or worsening symptoms Encouraged patient to Call [facility] as needed   Hypertension: (Status: Condition stable. Not addressed this visit.) Last practice recorded BP readings:  BP Readings from Last 3 Encounters:  07/24/21 135/61  07/13/21 (!) 182/69  07/03/21 (!) 153/72  Reviewed and discussed medications Discussed episodes of hypotension Staff message sent to PCP regarding episodes of hypotension and potential medication timing adjustments that may help with that Encouraged patient to drink plenty of fluids and to avoid dehydration Encouraged increased physical activity as tolerated with a  goal of 150 min a week  Patient Goals/Self-Care Activities: Patient will self administer medications as prescribed Patient will attend all scheduled provider appointments Patient will call pharmacy for medication refills Patient will continue to perform ADL's independently Patient will continue to perform IADL's independently Patient will call provider office for new concerns or questions     Plan:Telephone follow up appointment with care management team member scheduled for:  09/02/21 with RNCM and The patient has been provided with contact information for the care management team and has been advised to call with any health related questions or concerns.   Chong Sicilian, BSN, RN-BC Embedded Chronic Care Manager [facility] / [place] Management Direct Dial: [phone]

## 2021-08-20 NOTE — Chronic Care Management (AMB) (Signed)
Chronic Care Management   CCM RN Visit Note  08/12/2021 Name: [name] MRN: [id number] DOB: 1956-03-13  Subjective: [name] is a 65 y.o. year old male who is a primary care patient of [name], DO. The care management team was consulted for assistance with disease management and care coordination needs.    Engaged with patient by telephone for follow up visit in response to provider referral for case management and/or care coordination services.   Consent to Services:  The patient was given information about Chronic Care Management services, agreed to services, and gave verbal consent prior to initiation of services.  Please see initial visit note for detailed documentation.   Patient agreed to services and verbal consent obtained.   Assessment: Review of patient past medical history, allergies, medications, health status, including review of consultants reports, laboratory and other test data, was performed as part of comprehensive evaluation and provision of chronic care management services.   SDOH (Social Determinants of Health) assessments and interventions performed:    CCM Care Plan  No Known Allergies  Outpatient Encounter Medications as of 08/12/2021  Medication Sig   amLODipine (NORVASC) 10 MG tablet Take 1 tablet (10 mg total) by mouth daily.   aspirin 81 MG EC tablet Take 1 tablet (81 mg total) by mouth daily. Swallow whole.   atorvastatin (LIPITOR) 80 MG tablet Take 1 tablet (80 mg total) by mouth daily.   carvedilol (COREG) 12.5 MG tablet Take 1 tablet (12.5 mg total) by mouth 2 (two) times daily with a meal.   cloNIDine (CATAPRES) 0.3 MG tablet Take 1 tablet (0.3 mg total) by mouth 2 (two) times daily.   empagliflozin (JARDIANCE) 10 MG TABS tablet Take 1 tablet (10 mg total) by mouth daily before breakfast.   furosemide (LASIX) 40 MG tablet Take 1 tablet (40 mg total) by mouth daily.   gabapentin (NEURONTIN) 100 MG capsule Take 1  capsule (100 mg total) by mouth 2 (two) times daily.   insulin degludec (TRESIBA FLEXTOUCH) 100 UNIT/ML FlexTouch Pen Inject 38-46 units daily as directed (increase by 1 unit every other day until sugar is below 150) (Patient taking differently: Inject into the skin daily. Inject 38-46 units daily as directed (increase by 1 unit every other day until sugar is below 150))   Insulin Pen Needle (PEN NEEDLES) 31G X 5 MM MISC Use daily with insulin Dx E11.9   isosorbide mononitrate (IMDUR) 60 MG 24 hr tablet Take 1 tablet (60 mg total) by mouth daily.   nitroGLYCERIN (NITROSTAT) 0.4 MG SL tablet Place 1 tablet (0.4 mg total) under the tongue every 5 (five) minutes as needed for chest pain.   ticagrelor (BRILINTA) 90 MG TABS tablet Take 1 tablet (90 mg total) by mouth 2 (two) times daily.   No facility-administered encounter medications on file as of 08/12/2021.    Patient Active Problem List   Diagnosis Date Noted   Morbid obesity ([place]) 08/19/2021   Coronary artery disease involving native coronary artery of native heart with angina pectoris (HCC)    Congestive heart failure (CHF) ([place]) 07/19/2021   Acute combined systolic and diastolic heart failure ([place]) 07/19/2021   CAD, multiple vessel 06/25/2021   Elevated troponin    DM (diabetes mellitus) type II, controlled, with peripheral vascular disorder (HCC)    Chronic kidney disease (CKD) stage G3b/A2, moderately decreased glomerular filtration rate (GFR) between 30-44 mL/min/1.73 square meter and albuminuria creatinine ratio between 30-299 mg/g (HCC)    Unstable  angina ([place]) 06/07/2021   NSTEMI (non-ST elevated myocardial infarction) ([place]) 06/07/2021   Chest pain 04/07/2021   Personal history of diabetic foot ulcer 06/18/2020   History of amputation of great toe ([place]) 06/18/2020   Osteomyelitis of great toe of right foot ([place])    Diabetic ulcer of toe of right foot associated with diabetes mellitus due to underlying condition, with necrosis of  bone ([place])    Osteomyelitis ([place]) 05/15/2020   AKI (acute kidney injury) ([place]) 05/15/2020   OSA (obstructive sleep apnea) 05/15/2020   Acute kidney failure, unspecified ([place]) 05/15/2020   Cellulitis of right foot 05/07/2020   Type 2 diabetes mellitus ([place]) 05/07/2020   Cellulitis of right lower extremity    Diabetic infection of right foot ([place])    Cellulitis of great toe, right    Diabetic ulcer of toe of right foot associated with type 2 diabetes mellitus, with fat layer exposed ([place]) 04/15/2020   OSA on CPAP 09/01/2018   S/P lumbar fusion 07/06/2018   DDD (degenerative disc disease), lumbar 02/28/2018   Hypertension associated with diabetes ([place]) 02/23/2017   Benign prostatic hyperplasia with incomplete bladder emptying 02/23/2017   Vitamin D deficiency 02/23/2017   Hyperlipidemia associated with type 2 diabetes mellitus ([place]) 02/23/2017   Type 2 diabetes mellitus with stage 2 chronic kidney disease, without long-term current use of insulin ([place]) 02/23/2017   Hyperlipidemia, unspecified 02/23/2017   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    Conditions to be addressed/monitored:CAD and HTN  Care Plan : [facility] Care Plan   Problem: Chronic Disease Management Needs   Priority: Medium     Long-Range Goal: Patient will Work with [facility] Regarding Chronic Care Management and Care Coordination Associated with HTN, DM, and CAD   This Visit's Progress: On track  Recent Progress: On track  Priority: Medium  Note:   Current Barriers:  Knowledge Deficits related to plan of care for management of CAD  Chronic Disease Management support and education needs related to CAD, HTN, DMII, and CKD Stage 3  RNCM Clinical Goal(s):  Patient will verbalize understanding of plan for management of CAD  take all medications exactly as prescribed and will call provider for medication related questions continue to work with RN Care Manager to address care management and care coordination needs  related to CAD, HTN, DMII, and CKD Stage 3  through collaboration with RN Care manager, provider, and care team.   Interventions: 1:1 collaboration with primary care provider regarding development and update of comprehensive plan of care as evidenced by provider attestation and co-signature Inter-disciplinary care team collaboration (see longitudinal plan of care) Evaluation of current treatment plan related to  self management and patient's adherence to plan as established by provider Medications reviewed and importance of compliance discussed.   CAD  (Status: Goal on track: YES.) Medications reviewed including medications utilized in CAD treatment plan Provided education on importance of blood pressure control in management of CAD; Advised to report any changes in symptoms or exercise tolerance Discussed use of sublingual nitroglycerin for chest pain and frequency of need Reviewed education on how to store and take nitro Discussed use of nitro Therapeutic listening utilized regarding recent heart attack, ED visit, and hospital stay Personal symptoms of recent heart attack reviewed. Discussed s/s to monitor for. Verbal education provided on when to see appropriate medical attention for chest pain, shortness of breath, and any other s/s of a heart attack Discussed family/social support Discussed current physical activity level and ability to perform  ADLs Feeling better. Was bailing hay when I called.  Discussed recent hospitalization and appt with cardiology Reviewed upcoming appointments Encouraged to reach out to cardiologist with any new or worsening symptoms or seek appropriate medical attention   Diabetes:  (Status: Condition stable. Not addressed this visit.) Lab Results  Component Value Date   HGBA1C 6.5 (H) 06/08/2021   HGBA1C 7.1 (H) 03/30/2021   HGBA1C 6.9 11/28/2020   Lab Results  Component Value Date   LDLCALC 61 07/21/2021   CREATININE 2.28 (H) 07/27/2021  Assessed  patient's understanding of A1c goal: <7% Reinforced need to check and record blood pressure daily Advised patient to bring blood pressure log to PCP and nephrology visits Advised patient to call PCP or nephrologist with any readings outside of recommended range Reviewed and discussed upcoming appointment with cardiologist Encouraged patient to seek medical attention for any new or worsening symptoms Encouraged patient to Call [facility] as needed   Hypertension: (Status: Condition stable. Not addressed this visit.) Last practice recorded BP readings:  BP Readings from Last 3 Encounters:  07/24/21 135/61  07/13/21 (!) 182/69  07/03/21 (!) 153/72  Reviewed and discussed medications Discussed episodes of hypotension Staff message sent to PCP regarding episodes of hypotension and potential medication timing adjustments that may help with that Encouraged patient to drink plenty of fluids and to avoid dehydration Encouraged increased physical activity as tolerated with a  goal of 150 min a week  Patient Goals/Self-Care Activities: Patient will self administer medications as prescribed Patient will attend all scheduled provider appointments Patient will call pharmacy for medication refills Patient will continue to perform ADL's independently Patient will continue to perform IADL's independently Patient will call provider office for new concerns or questions       Plan:Telephone follow up appointment with care management team member scheduled for:  08/28/21 with RNCM and The patient has been provided with contact information for the care management team and has been advised to call with any health related questions or concerns.   Chong Sicilian, BSN, RN-BC Embedded Chronic Care Manager [facility] / [place] Management Direct Dial: [phone]

## 2021-08-20 NOTE — Patient Instructions (Signed)
Visit Information  PATIENT GOALS:  Goals Addressed             This Visit's Progress    RNCM: Manage CAD   On track    Timeframe:  Long-Range Goal Priority:  High Start Date:   07/01/21                          Expected End Date:                       Follow-up: 08/28/21  Keep all medical appointments Take all medications as prescribed Take nitroglycerin as needed for chest pain Nitro can cause a headache, lowered blood pressure, and lightheadedness Nitro can be taken 3 times, 5 minutes a part for chest pain that doesn't go away If chest pain is not gone after the 2nd Nitro, call 911 and take the 3rd dose Eat plenty of whole foods: lean proteins, vegetables, low glycemic fruits. Avoid sugars, simple carbs, limit sodium/salt Work on weight loss Increase physical activity level with a goal of at least 150 minutes a week Seek appropriate medical attention for any new or worsening symptoms     RNCM: Track and Manage My Blood Pressure-Hypertension   Not on track    Timeframe:  Long-Range Goal Priority:  Medium Start Date:                             Expected End Date:                       Follow Up Date 1019/2022   check blood pressure daily and vary the time of day write blood pressure results in a log or diary  Take blood pressure log to PCP appointments Call PCP or nephrologist with any readings outside of recommended range Increase water intake Keep all medical appointments Call PCP for appt or seek medical attention for any new or worsening symptoms Take all medications as directed Call RN Care Manager as needed [phone]   Why is this important?   You won't feel high blood pressure, but it can still hurt your blood vessels.  High blood pressure can cause heart or kidney problems. It can also cause a stroke.  Making lifestyle changes like losing a little weight or eating less salt will help.  Checking your blood pressure at home and at different times of the day  can help to control blood pressure.  If the doctor prescribes medicine remember to take it the way the doctor ordered.  Call the office if you cannot afford the medicine or if there are questions about it.     Notes:         Patient verbalizes understanding of instructions provided today and agrees to view in [place].   Plan:Telephone follow up appointment with care management team member scheduled for:  08/28/21 with RNCM and The patient has been provided with contact information for the care management team and has been advised to call with any health related questions or concerns.   Chong Sicilian, BSN, RN-BC Embedded Chronic Care Manager [facility] / [place] Management Direct Dial: [phone]

## 2021-08-23 IMAGING — MR MR FOOT*R* W/O CM
4 of 5 series · 21 of 40 positions shown · non-contrast
Comparison: X-ray 05/07/2020

CLINICAL DATA: Open wound to right great toe for 3 weeks. Diabetic.

EXAM:
MRI OF THE RIGHT FOREFOOT WITHOUT CONTRAST
TECHNIQUE: Multiplanar, multisequence MR imaging of the right forefoot was
performed. No intravenous contrast was administered.

[Series 7: T1 · coronal · 4.0mm · 0.24mm/px · 9 of 35 slices shown (1 of 2)]
[im 1/35]
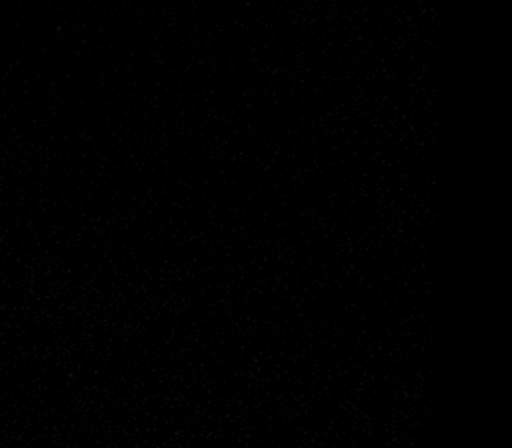
[im 7/35]
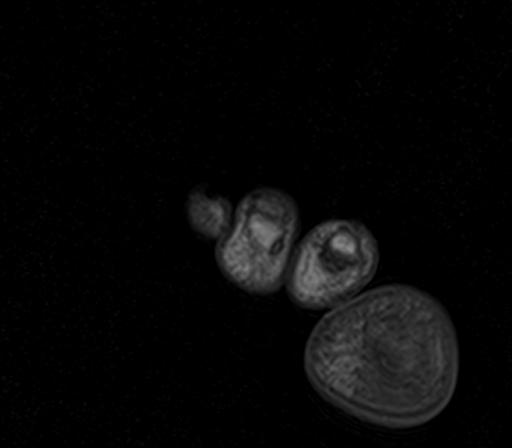
[im 10/35]
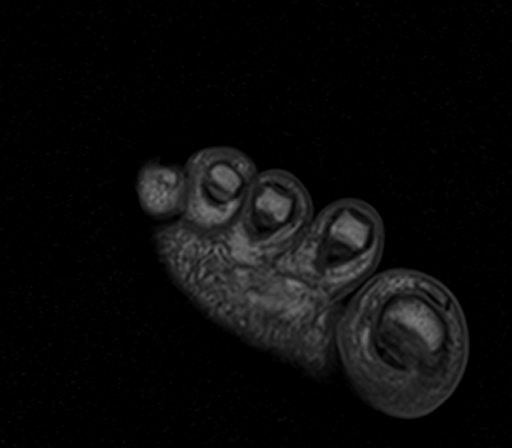
[im 16/35]
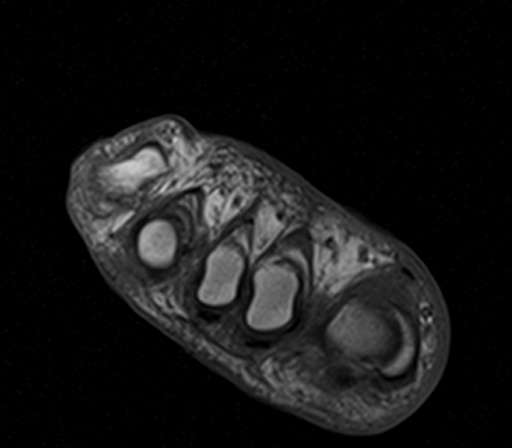
[im 19/35]
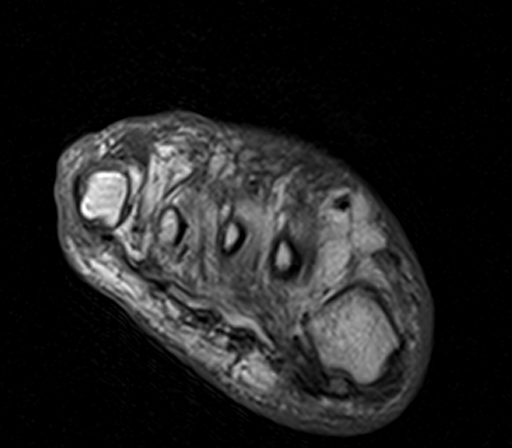
[im 25/35]
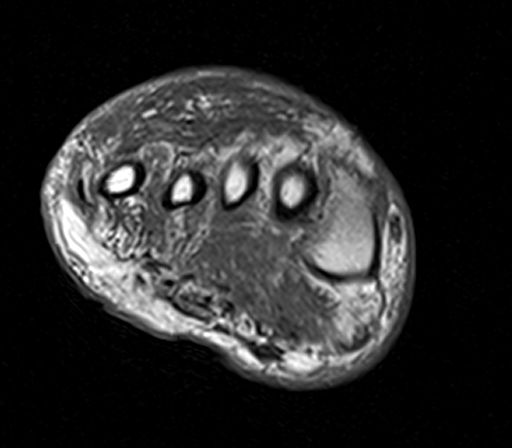
[im 28/35]
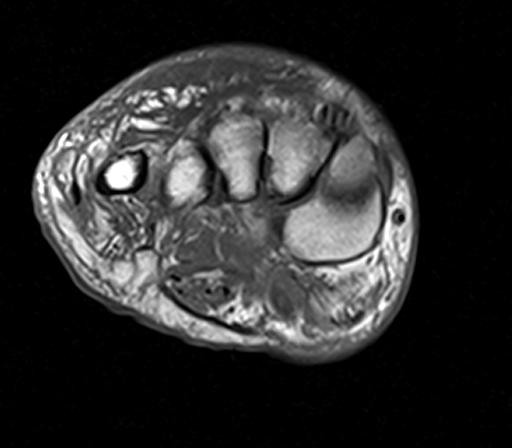
[im 31/35]
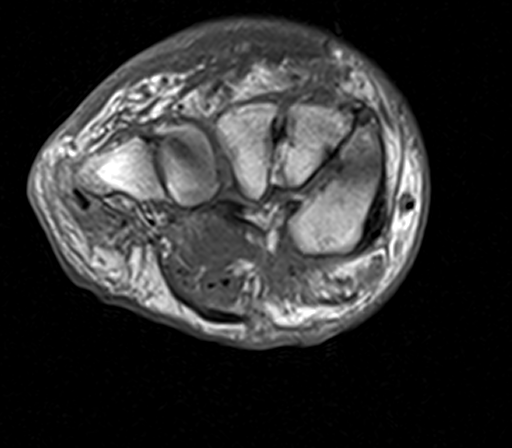
[im 35/35]
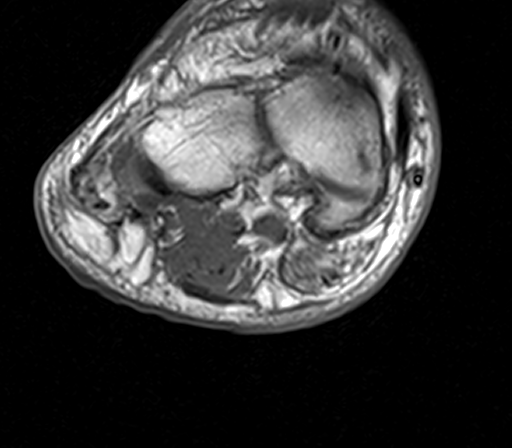

[Series 8: t2fs axial · coronal · 4.0mm · 0.29mm/px · 4 of 36 slices shown]
[im 1/36]
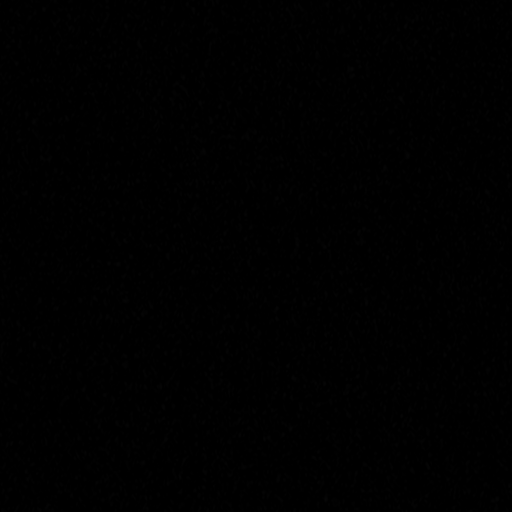
[im 4/36]
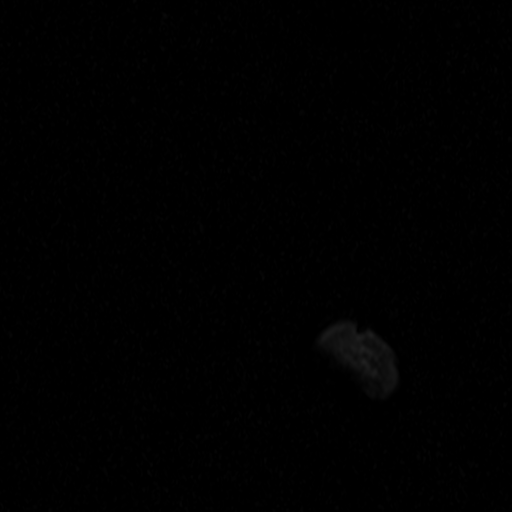
[im 18/36]
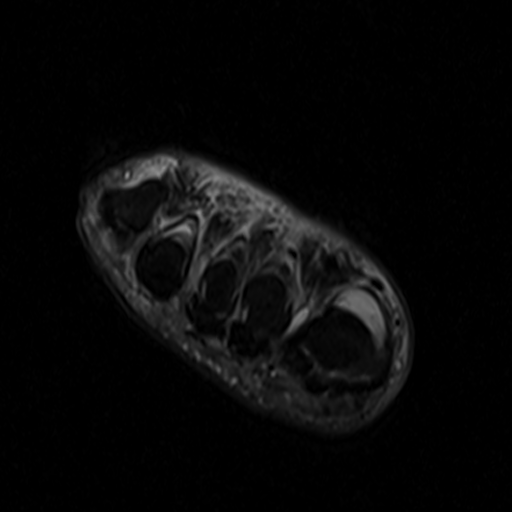
[im 32/36]
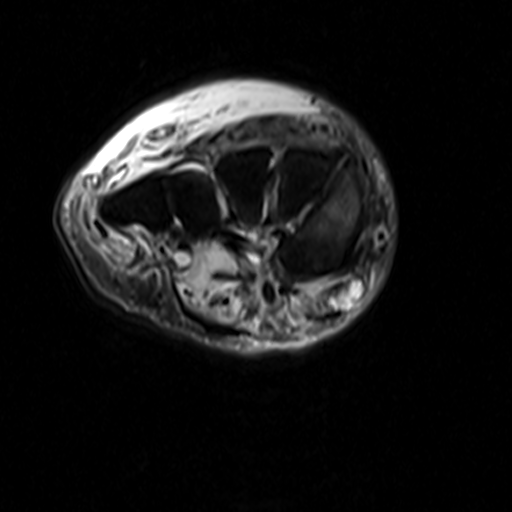

[Series 9: T1 · axial · 4.0mm · 0.34mm/px · z∈[+16,+93]mm · 5 of 18 slices shown (2 of 2)]
[im 1/18]
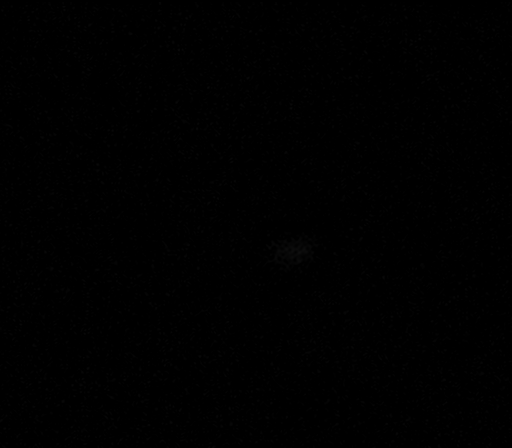
[im 5/18]
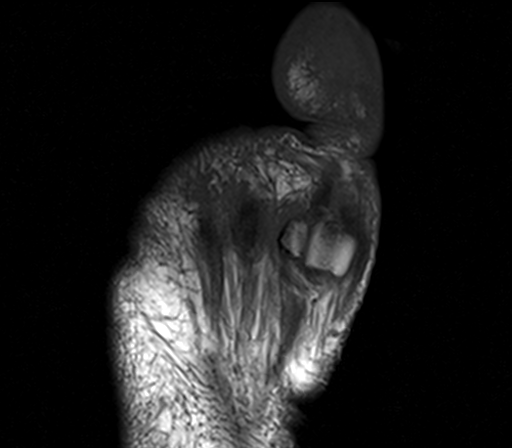
[im 9/18]
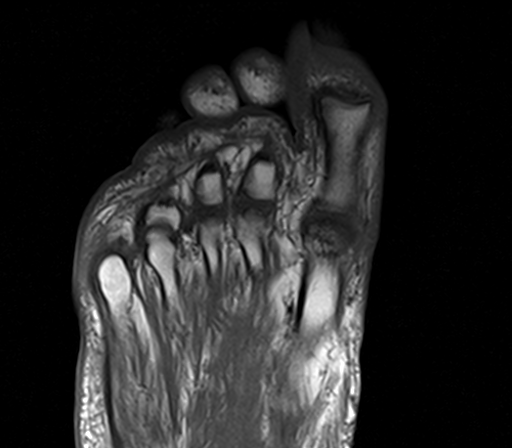
[im 13/18]
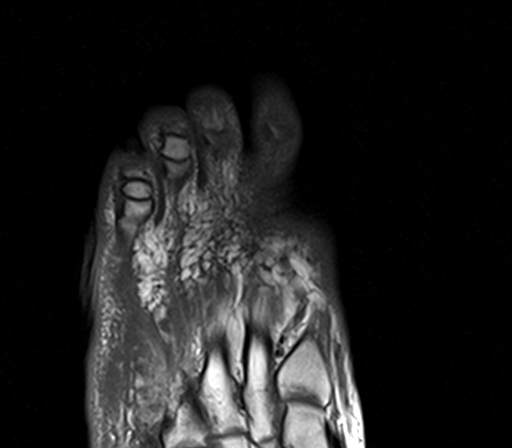
[im 18/18]
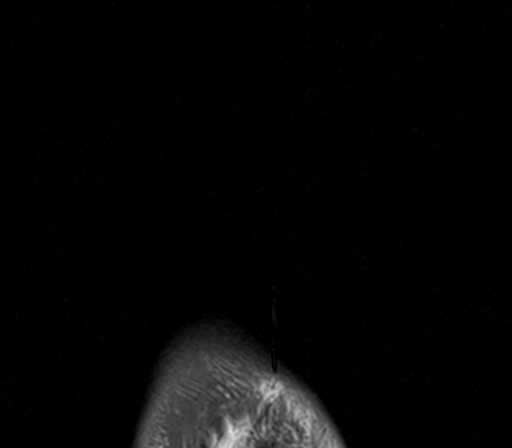

[Series 10: t2fs cor · axial · 4.0mm · 0.34mm/px · z∈[+13,+93]mm · 3 of 18 slices shown]
[im 1/18]
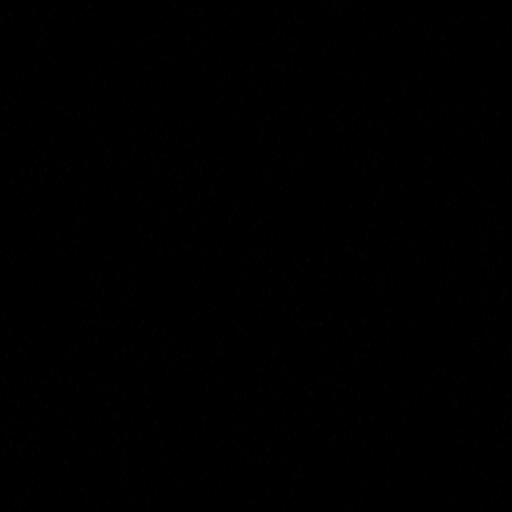
[im 9/18]
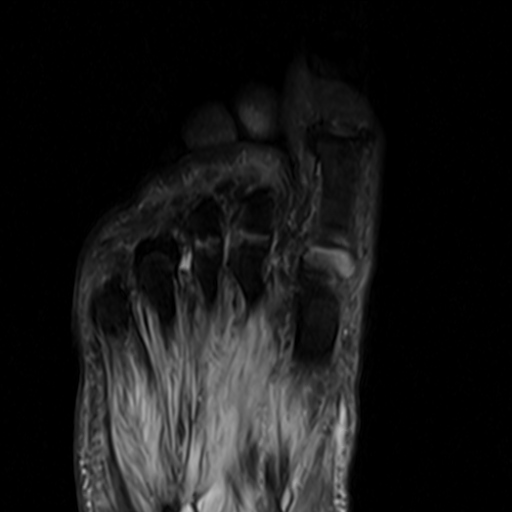
[im 18/18]
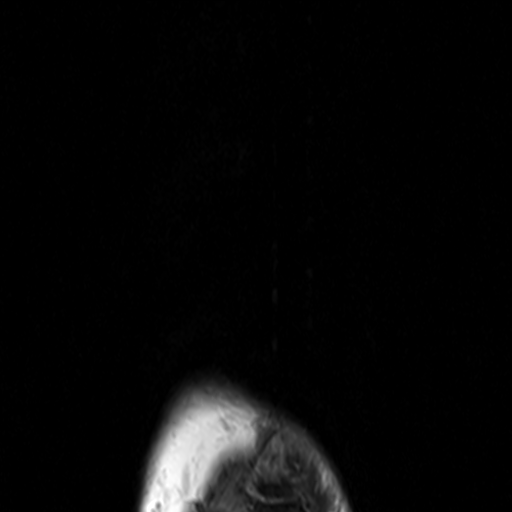

[21 of 40 positions shown; findings below may reference images not displayed]

FINDINGS: Bones/Joint/Cartilage

Bone marrow edema with confluent low T1 signal within the distal
phalanx of the right great toe compatible with acute osteomyelitis
(series 9, image 12). Signal abnormality extends to the base of the
distal phalanx. Preservation of the fatty T1 marrow signal within
the great toe proximal phalanx. There is a small nonspecific joint
effusion at the first metatarsophalangeal joint. Preservation of the
bone marrow signal within the remaining visualized forefoot. No
acute fracture. No malalignment. Moderate arthropathy of the great
toe IP joint.

Ligaments

Intact Lisfranc ligament. Collateral ligaments of the forefoot
appear intact.

Muscles and Tendons

Mild diffuse edema like signal throughout the intrinsic foot
musculature without focal intramuscular fluid collection. Mild fatty
infiltration of the foot musculature suggesting chronic denervation
changes. No focal tendinous discontinuity. Mild tenosynovial fluid
involving the extensor tendon of the great toe.

Soft tissues

Diffuse soft tissue swelling and edema at the great toe. Small
amount of periosteal fluid overlying the distal tuft (series 8,
image 6). Otherwise, no well-defined soft tissue fluid collection.
IMPRESSION: 1. Acute osteomyelitis of the distal phalanx of the right great toe.
2. Diffuse soft tissue swelling and edema at the great toe. Small
amount of fluid overlies the distal tuft.
3. Small nonspecific joint effusion at the first MTP joint, which
may be reactive.
4. Mild tenosynovial fluid involving the extensor tendon of the
great toe.
5. Mild diffuse edema-like signal throughout the intrinsic foot
musculature suggesting which may represent a nonspecific myositis
versus denervation changes.

## 2021-08-24 ENCOUNTER — Ambulatory Visit: Payer: PPO | Admitting: *Deleted

## 2021-08-24 DIAGNOSIS — I2510 Atherosclerotic heart disease of native coronary artery without angina pectoris: Secondary | ICD-10-CM

## 2021-08-24 DIAGNOSIS — E1122 Type 2 diabetes mellitus with diabetic chronic kidney disease: Secondary | ICD-10-CM

## 2021-08-24 DIAGNOSIS — I152 Hypertension secondary to endocrine disorders: Secondary | ICD-10-CM

## 2021-08-24 DIAGNOSIS — N183 Chronic kidney disease, stage 3 unspecified: Secondary | ICD-10-CM

## 2021-08-24 NOTE — Chronic Care Management (AMB) (Signed)
Chronic Care Management   CCM RN Visit Note  08/24/2021 Name: [name] MRN: [id number] DOB: [birth year]  Subjective: [name] is a 65 y.o. year old male who is a primary care patient of [name], DO. The care management team was consulted for assistance with disease management and care coordination needs.    Engaged with patient by telephone for follow up visit in response to provider referral for case management and/or care coordination services.   Consent to Services:  The patient was given information about Chronic Care Management services, agreed to services, and gave verbal consent prior to initiation of services.  Please see initial visit note for detailed documentation.   Patient agreed to services and verbal consent obtained.   Assessment: Review of patient past medical history, allergies, medications, health status, including review of consultants reports, laboratory and other test data, was performed as part of comprehensive evaluation and provision of chronic care management services.   SDOH (Social Determinants of Health) assessments and interventions performed:    CCM Care Plan  No Known Allergies  Outpatient Encounter Medications as of 08/24/2021  Medication Sig   amLODipine (NORVASC) 10 MG tablet Take 1 tablet (10 mg total) by mouth daily.   aspirin 81 MG EC tablet Take 1 tablet (81 mg total) by mouth daily. Swallow whole.   atorvastatin (LIPITOR) 80 MG tablet Take 1 tablet (80 mg total) by mouth daily.   carvedilol (COREG) 12.5 MG tablet Take 1 tablet (12.5 mg total) by mouth 2 (two) times daily with a meal.   cloNIDine (CATAPRES) 0.3 MG tablet Take 1 tablet (0.3 mg total) by mouth 2 (two) times daily.   empagliflozin (JARDIANCE) 10 MG TABS tablet Take 1 tablet (10 mg total) by mouth daily before breakfast.   furosemide (LASIX) 40 MG tablet Take 1 tablet (40 mg total) by mouth daily.   gabapentin (NEURONTIN) 100 MG capsule Take 1  capsule (100 mg total) by mouth 2 (two) times daily.   insulin degludec (TRESIBA FLEXTOUCH) 100 UNIT/ML FlexTouch Pen Inject 38-46 units daily as directed (increase by 1 unit every other day until sugar is below 150) (Patient taking differently: Inject into the skin daily. Inject 38-46 units daily as directed (increase by 1 unit every other day until sugar is below 150))   Insulin Pen Needle (PEN NEEDLES) 31G X 5 MM MISC Use daily with insulin Dx E11.9   isosorbide mononitrate (IMDUR) 60 MG 24 hr tablet Take 1 tablet (60 mg total) by mouth daily.   nitroGLYCERIN (NITROSTAT) 0.4 MG SL tablet Place 1 tablet (0.4 mg total) under the tongue every 5 (five) minutes as needed for chest pain.   ticagrelor (BRILINTA) 90 MG TABS tablet Take 1 tablet (90 mg total) by mouth 2 (two) times daily.   No facility-administered encounter medications on file as of 08/24/2021.    Patient Active Problem List   Diagnosis Date Noted   Morbid obesity ([place]) 08/19/2021   Coronary artery disease involving native coronary artery of native heart with angina pectoris (HCC)    Congestive heart failure (CHF) ([place]) 07/19/2021   Acute combined systolic and diastolic heart failure ([place]) 07/19/2021   CAD, multiple vessel 06/25/2021   Elevated troponin    DM (diabetes mellitus) type II, controlled, with peripheral vascular disorder (HCC)    Chronic kidney disease (CKD) stage G3b/A2, moderately decreased glomerular filtration rate (GFR) between 30-44 mL/min/1.73 square meter and albuminuria creatinine ratio between 30-299 mg/g (HCC)    Unstable  angina ([place]) 06/07/2021   NSTEMI (non-ST elevated myocardial infarction) ([place]) 06/07/2021   Chest pain 04/07/2021   Personal history of diabetic foot ulcer 06/18/2020   History of amputation of great toe ([place]) 06/18/2020   Osteomyelitis of great toe of right foot ([place])    Diabetic ulcer of toe of right foot associated with diabetes mellitus due to underlying condition, with necrosis of  bone ([place])    Osteomyelitis ([place]) 05/15/2020   AKI (acute kidney injury) ([place]) 05/15/2020   OSA (obstructive sleep apnea) 05/15/2020   Acute kidney failure, unspecified ([place]) 05/15/2020   Cellulitis of right foot 05/07/2020   Type 2 diabetes mellitus ([place]) 05/07/2020   Cellulitis of right lower extremity    Diabetic infection of right foot ([place])    Cellulitis of great toe, right    Diabetic ulcer of toe of right foot associated with type 2 diabetes mellitus, with fat layer exposed ([place]) 04/15/2020   OSA on CPAP 09/01/2018   S/P lumbar fusion 07/06/2018   DDD (degenerative disc disease), lumbar 02/28/2018   Hypertension associated with diabetes ([place]) 02/23/2017   Benign prostatic hyperplasia with incomplete bladder emptying 02/23/2017   Vitamin D deficiency 02/23/2017   Hyperlipidemia associated with type 2 diabetes mellitus ([place]) 02/23/2017   Type 2 diabetes mellitus with stage 2 chronic kidney disease, without long-term current use of insulin ([place]) 02/23/2017   Hyperlipidemia, unspecified 02/23/2017   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    Conditions to be addressed/monitored:CAD and HTN  Care Plan : [facility] Care Plan  Updates made by Ilean China, RN since 08/24/2021 12:00 AM     Problem: Chronic Disease Management Needs   Priority: Medium     Long-Range Goal: Patient will Work with [facility] Regarding Chronic Care Management and Care Coordination Associated with HTN, DM, and CAD   This Visit's Progress: On track  Recent Progress: On track  Priority: Medium  Note:   Current Barriers:  Knowledge Deficits related to plan of care for management of CAD  Chronic Disease Management support and education needs related to CAD, HTN, DMII, and CKD Stage 3  RNCM Clinical Goal(s):  Patient will verbalize understanding of plan for management of CAD  take all medications exactly as prescribed and will call provider for medication related questions continue to work with RN  Care Manager to address care management and care coordination needs related to CAD, HTN, DMII, and CKD Stage 3  through collaboration with RN Care manager, provider, and care team.   Interventions: 1:1 collaboration with primary care provider regarding development and update of comprehensive plan of care as evidenced by provider attestation and co-signature Inter-disciplinary care team collaboration (see longitudinal plan of care) Evaluation of current treatment plan related to  self management and patient's adherence to plan as established by provider Medications reviewed and importance of compliance discussed.   CAD  (Status: Condition stable. Not addressed this visit.) Medications reviewed including medications utilized in CAD treatment plan Provided education on importance of blood pressure control in management of CAD; Advised to report any changes in symptoms or exercise tolerance Discussed use of sublingual nitroglycerin for chest pain and frequency of need Reviewed education on how to store and take nitro Discussed use of nitro Therapeutic listening utilized regarding recent heart attack, ED visit, and hospital stay Personal symptoms of recent heart attack reviewed. Discussed s/s to monitor for. Verbal education provided on when to see appropriate medical attention for chest pain, shortness of breath, and any other s/s  of a heart attack Discussed family/social support Discussed current physical activity level and ability to perform ADLs Feeling better. Was bailing hay when I called.  Discussed recent hospitalization and appt with cardiology Reviewed upcoming appointments Encouraged to reach out to cardiologist with any new or worsening symptoms or seek appropriate medical attention   Diabetes:  (Status: Condition stable. Not addressed this visit.) Lab Results  Component Value Date   HGBA1C 6.5 (H) 06/08/2021   HGBA1C 7.1 (H) 03/30/2021   HGBA1C 6.9 11/28/2020   Lab Results   Component Value Date   LDLCALC 61 07/21/2021   CREATININE 2.28 (H) 07/27/2021  Assessed patient's understanding of A1c goal: <7% Reinforced need to check and record blood pressure daily Advised patient to bring blood pressure log to PCP and nephrology visits Advised patient to call PCP or nephrologist with any readings outside of recommended range Reviewed and discussed upcoming appointment with cardiologist Encouraged patient to seek medical attention for any new or worsening symptoms Encouraged patient to Call [facility] as needed   Hypertension: (Status: Goal on track: YES.) Last practice recorded BP readings:  BP Readings from Last 3 Encounters:  08/03/21 (!) 151/76  07/24/21 135/61  07/13/21 (!) 182/69   Reviewed and discussed medications Discussed episodes of hypotension Has not had anymore since last telephone visit Collaborated with Dr Lajuana Ripple regarding episodes of hypotension.  Ok to switch amlodipine to night F/u with cardiology if persists Encouraged patient to drink plenty of fluids and to avoid dehydration Encouraged increased physical activity as tolerated with a  goal of 150 min a week Advised that he can adjust med and take amlodipine at night, but since he hasn't had any additional episodes of hypotension, that it's ok to continue with current schedule Encouraged to f/u with cardiology if he has any future episodes of hypotension Encouraged to move carefully and change positions slowly to prevent orthostatic hypotension Reviewed upcoming appointments: cardio in 2/23  Patient Goals/Self-Care Activities: Patient will self administer medications as prescribed Patient will attend all scheduled provider appointments Patient will call pharmacy for medication refills Patient will continue to perform ADL's independently Patient will continue to perform IADL's independently Patient will call provider office for new concerns or questions       Plan:Telephone follow  up appointment with care management team member scheduled for:  09/02/21 with RNCM and The patient has been provided with contact information for the care management team and has been advised to call with any health related questions or concerns.   Chong Sicilian, BSN, RN-BC Embedded Chronic Care Manager [facility] / [facility] Management Direct Dial: [phone]

## 2021-08-24 NOTE — Patient Instructions (Signed)
Visit Information  PATIENT GOALS:  Goals Addressed             This Visit's Progress    RNCM: Track and Manage My Blood Pressure-Hypertension   On track    Timeframe:  Long-Range Goal Priority:  Medium Start Date:                             Expected End Date:                       Follow Up Date 1019/2022   check blood pressure daily and vary the time of day write blood pressure results in a log or diary  Take blood pressure log to PCP appointments Call with any readings outside of recommended range Increase water intake Keep all medical appointments Call PCP for appt or seek medical attention for any new or worsening symptoms Take all medications as directed Call cardiologist with any episodes of low blood pressure Move carefully and change positions slowly to keep your blood pressure from dropping Call RN Care Manager as needed [phone]   Why is this important?   You won't feel high blood pressure, but it can still hurt your blood vessels.  High blood pressure can cause heart or kidney problems. It can also cause a stroke.  Making lifestyle changes like losing a little weight or eating less salt will help.  Checking your blood pressure at home and at different times of the day can help to control blood pressure.  If the doctor prescribes medicine remember to take it the way the doctor ordered.  Call the office if you cannot afford the medicine or if there are questions about it.     Notes:         Patient verbalizes understanding of instructions provided today and agrees to view in [place].    Plan:Telephone follow up appointment with care management team member scheduled for:  09/02/21 with RNCM and The patient has been provided with contact information for the care management team and has been advised to call with any health related questions or concerns.   Chong Sicilian, BSN, RN-BC Embedded Chronic Care Manager [facility] / [place]  Management Direct Dial: [phone]

## 2021-08-25 ENCOUNTER — Telehealth: Payer: Self-pay | Admitting: Podiatry

## 2021-08-25 NOTE — Telephone Encounter (Signed)
Received [employer] auth # [id number] for diabetic shoes/inserts([id number] [phone]) valid 10.12.2022 thru 1.10.2023.Marland KitchenMarland Kitchen

## 2021-08-27 ENCOUNTER — Other Ambulatory Visit: Payer: PPO

## 2021-08-27 ENCOUNTER — Other Ambulatory Visit: Payer: Self-pay

## 2021-08-27 DIAGNOSIS — R809 Proteinuria, unspecified: Secondary | ICD-10-CM | POA: Diagnosis not present

## 2021-08-27 DIAGNOSIS — E1129 Type 2 diabetes mellitus with other diabetic kidney complication: Secondary | ICD-10-CM | POA: Diagnosis not present

## 2021-08-27 DIAGNOSIS — I129 Hypertensive chronic kidney disease with stage 1 through stage 4 chronic kidney disease, or unspecified chronic kidney disease: Secondary | ICD-10-CM | POA: Diagnosis not present

## 2021-08-27 DIAGNOSIS — N189 Chronic kidney disease, unspecified: Secondary | ICD-10-CM | POA: Diagnosis not present

## 2021-08-27 DIAGNOSIS — R600 Localized edema: Secondary | ICD-10-CM | POA: Diagnosis not present

## 2021-08-27 DIAGNOSIS — E1122 Type 2 diabetes mellitus with diabetic chronic kidney disease: Secondary | ICD-10-CM | POA: Diagnosis not present

## 2021-08-27 DIAGNOSIS — D631 Anemia in chronic kidney disease: Secondary | ICD-10-CM | POA: Diagnosis not present

## 2021-08-27 DIAGNOSIS — R808 Other proteinuria: Secondary | ICD-10-CM | POA: Diagnosis not present

## 2021-08-31 DIAGNOSIS — Z7984 Long term (current) use of oral hypoglycemic drugs: Secondary | ICD-10-CM | POA: Diagnosis not present

## 2021-08-31 DIAGNOSIS — Z89411 Acquired absence of right great toe: Secondary | ICD-10-CM | POA: Diagnosis not present

## 2021-08-31 DIAGNOSIS — N183 Chronic kidney disease, stage 3 unspecified: Secondary | ICD-10-CM | POA: Diagnosis not present

## 2021-08-31 DIAGNOSIS — Z794 Long term (current) use of insulin: Secondary | ICD-10-CM | POA: Diagnosis not present

## 2021-08-31 DIAGNOSIS — I5042 Chronic combined systolic (congestive) and diastolic (congestive) heart failure: Secondary | ICD-10-CM | POA: Diagnosis not present

## 2021-08-31 DIAGNOSIS — Z955 Presence of coronary angioplasty implant and graft: Secondary | ICD-10-CM | POA: Diagnosis not present

## 2021-08-31 DIAGNOSIS — E1122 Type 2 diabetes mellitus with diabetic chronic kidney disease: Secondary | ICD-10-CM | POA: Diagnosis not present

## 2021-08-31 DIAGNOSIS — I252 Old myocardial infarction: Secondary | ICD-10-CM | POA: Diagnosis not present

## 2021-09-02 ENCOUNTER — Telehealth: Payer: PPO

## 2021-09-03 DIAGNOSIS — N189 Chronic kidney disease, unspecified: Secondary | ICD-10-CM | POA: Diagnosis not present

## 2021-09-03 DIAGNOSIS — E1122 Type 2 diabetes mellitus with diabetic chronic kidney disease: Secondary | ICD-10-CM | POA: Diagnosis not present

## 2021-09-03 DIAGNOSIS — E1129 Type 2 diabetes mellitus with other diabetic kidney complication: Secondary | ICD-10-CM | POA: Diagnosis not present

## 2021-09-03 DIAGNOSIS — I129 Hypertensive chronic kidney disease with stage 1 through stage 4 chronic kidney disease, or unspecified chronic kidney disease: Secondary | ICD-10-CM | POA: Diagnosis not present

## 2021-09-03 DIAGNOSIS — N170 Acute kidney failure with tubular necrosis: Secondary | ICD-10-CM | POA: Diagnosis not present

## 2021-09-03 DIAGNOSIS — R808 Other proteinuria: Secondary | ICD-10-CM | POA: Diagnosis not present

## 2021-09-03 DIAGNOSIS — R809 Proteinuria, unspecified: Secondary | ICD-10-CM | POA: Diagnosis not present

## 2021-09-03 DIAGNOSIS — N19 Unspecified kidney failure: Secondary | ICD-10-CM | POA: Diagnosis not present

## 2021-09-03 DIAGNOSIS — D631 Anemia in chronic kidney disease: Secondary | ICD-10-CM | POA: Diagnosis not present

## 2021-09-04 ENCOUNTER — Ambulatory Visit: Payer: PPO | Admitting: *Deleted

## 2021-09-04 DIAGNOSIS — E1159 Type 2 diabetes mellitus with other circulatory complications: Secondary | ICD-10-CM

## 2021-09-04 DIAGNOSIS — I152 Hypertension secondary to endocrine disorders: Secondary | ICD-10-CM

## 2021-09-04 DIAGNOSIS — E1122 Type 2 diabetes mellitus with diabetic chronic kidney disease: Secondary | ICD-10-CM

## 2021-09-04 DIAGNOSIS — N183 Chronic kidney disease, stage 3 unspecified: Secondary | ICD-10-CM

## 2021-09-04 NOTE — Patient Instructions (Signed)
Visit Information Care Plan : RNCM Care Plan  Updates made by Hudy, Kristen N, RN since 09/04/2021 12:00 AM     Problem: Chronic Disease Management Needs   Priority: Medium     Long-Range Goal: Patient will Work with RNCM Regarding Chronic Care Management and Care Coordination Associated with HTN, DM, CKD, and CAD   Recent Progress: On track  Priority: Medium  Note:   Current Barriers:  Chronic Disease Management support and education needs related to CAD, HTN, DMII, and CKD Stage 3  RNCM Clinical Goal(s):  Patient will verbalize understanding of plan for management of CAD, HTN, DMII, and CKD Stage 3  take all medications exactly as prescribed and will call provider for medication related questions continue to work with RN Care Manager and/or Social Worker to address care management and care coordination needs related to CAD, HTN, DMII, and CKD Stage 3  through collaboration with RN Care manager, provider, and care team.   Interventions: 1:1 collaboration with primary care provider regarding development and update of comprehensive plan of care as evidenced by provider attestation and co-signature Inter-disciplinary care team collaboration (see longitudinal plan of care) Evaluation of current treatment plan related to  self management and patient's adherence to plan as established by provider Medications reviewed and importance of compliance discussed.   CAD  (Status: Condition stable. Not addressed this visit.) Medications reviewed including medications utilized in CAD treatment plan Provided education on importance of blood pressure control in management of CAD; Advised to report any changes in symptoms or exercise tolerance Discussed use of sublingual nitroglycerin for chest pain and frequency of need Reviewed education on how to store and take nitro Discussed use of nitro Therapeutic listening utilized regarding recent heart attack, ED visit, and hospital stay Personal symptoms  of recent heart attack reviewed. Discussed s/s to monitor for. Verbal education provided on when to see appropriate medical attention for chest pain, shortness of breath, and any other s/s of a heart attack Discussed family/social support Discussed current physical activity level and ability to perform ADLs Feeling better. Was bailing hay when I called.  Discussed recent hospitalization and appt with cardiology Reviewed upcoming appointments Encouraged to reach out to cardiologist with any new or worsening symptoms or seek appropriate medical attention   Diabetes:  (Status: Goal on track: YES.) Lab Results  Component Value Date   HGBA1C 6.5 (H) 06/08/2021   HGBA1C 7.1 (H) 03/30/2021   HGBA1C 6.9 11/28/2020   Lab Results  Component Value Date   LDLCALC 61 07/21/2021   CREATININE 2.28 (H) 07/27/2021  Assessed patient's understanding of A1c goal: <7% Reinforced need to check and record blood pressure daily Advised patient to bring blood pressure log to PCP and nephrology visits Advised patient to call PCP or nephrologist with any readings outside of recommended range Reviewed and discussed upcoming appointment with cardiologist Encouraged patient to seek medical attention for any new or worsening symptoms Reviewed and discussed medications and cost. Jardiance and Tresiba are expensive. Requesting samples if available.  Verbal education provided on Medicare Extra Help and Prescription Assistance through the drug manufacturers Collaborated with [facility] Clinical Staff regarding samples of Jardiance and Tresiba Encouraged patient to Call RNCM as needed   Hypertension: (Status: Condition stable. Not addressed this visit.) Last practice recorded BP readings:  BP Readings from Last 3 Encounters:  08/03/21 (!) 151/76  07/24/21 135/61  07/13/21 (!) 182/69   Reviewed and discussed medications Discussed episodes of hypotension Has not had anymore since last telephone   visit Collaborated with  Dr Lajuana Ripple regarding episodes of hypotension.  Ok to switch amlodipine to night F/u with cardiology if persists Encouraged patient to drink plenty of fluids and to avoid dehydration Encouraged increased physical activity as tolerated with a  goal of 150 min a week Advised that he can adjust med and take amlodipine at night, but since he hasn't had any additional episodes of hypotension, that it's ok to continue with current schedule Encouraged to f/u with cardiology if he has any future episodes of hypotension Encouraged to move carefully and change positions slowly to prevent orthostatic hypotension Reviewed upcoming appointments: cardio in 2/23   Chronic Kidney Disease (Status: Goal on track: YES.)  Last practice recorded BP readings:  BP Readings from Last 3 Encounters:  08/03/21 (!) 151/76  07/24/21 135/61  07/13/21 (!) 182/69  Most recent eGFR/CrCl:  Lab Results  Component Value Date   EGFR 31 (L) 07/27/2021    No components found for: CRCL  Assessed the patient's understanding of chronic kidney disease    Evaluation of current treatment plan related to chronic kidney disease self management and patient's adherence to plan as established by provider      Reviewed medications with patient and discussed importance of compliance    Counseled on the importance of exercise goals with target of 150 minutes per week     Advised patient, providing education and rationale, to monitor blood pressure daily and record, calling PCP for findings outside established parameters    Discussed complications of poorly controlled blood pressure such as heart disease, stroke, circulatory complications, vision complications, kidney impairment, sexual dysfunction    Discussed recent visit with nephrologist and recommendation for a low phosphorous diet. Reinforced provider recommendations.  Verbal education provided on phosphorous and a low phosphorous diet Printed educational materials on a low  phosphorous diet prepared and collaborated with [facility] front office staff to print and mail to patient Encouraged patient to reach out to [facility] with any questions or concerns Reviewed and discussed medications: losartan was restarted. Discussed ARB role in kidney function.     Patient Goals/Self-Care Activities: Patient will self administer medications as prescribed Patient will attend all scheduled provider appointments Patient will call pharmacy for medication refills Patient will continue to perform ADL's independently Patient will continue to perform IADL's independently Patient will call provider office for new concerns or questions Patient will check and record blood pressure daily and PRN and will call PCP with any readings outside of recommended range Patient will review printed educational material and call [place] with any questions [phone] Patient will check and record blood pressure daily and PRN and call PCP with any readings outside of recommended range       Patient verbalizes understanding of instructions provided today and agrees to view in [place].     Chong Sicilian, BSN, RN-BC Embedded Chronic Care Manager [facility] / [place] [facility] Direct Dial: [phone]

## 2021-09-04 NOTE — Chronic Care Management (AMB) (Signed)
Chronic Care Management   CCM RN Visit Note  09/04/2021 Name: [name] MRN: [id number] DOB: 1956/03/27  Subjective: [name] is a 65 y.o. year old male who is a primary care patient of Clifford Norlander, DO. The care management team was consulted for assistance with disease management and care coordination needs.    Engaged with patient by telephone for follow up visit in response to provider referral for case management and/or care coordination services.   Consent to Services:  The patient was given information about Chronic Care Management services, agreed to services, and gave verbal consent prior to initiation of services.  Please see initial visit note for detailed documentation.   Patient agreed to services and verbal consent obtained.   Assessment: Review of patient past medical history, allergies, medications, health status, including review of consultants reports, laboratory and other test data, was performed as part of comprehensive evaluation and provision of chronic care management services.   SDOH (Social Determinants of Health) assessments and interventions performed:    CCM Care Plan  No Known Allergies  Outpatient Encounter Medications as of 09/04/2021  Medication Sig   amLODipine (NORVASC) 10 MG tablet Take by mouth.   losartan (COZAAR) 25 MG tablet Take by mouth.   amLODipine (NORVASC) 10 MG tablet Take 1 tablet (10 mg total) by mouth daily.   aspirin 81 MG EC tablet Take 1 tablet (81 mg total) by mouth daily. Swallow whole.   atorvastatin (LIPITOR) 80 MG tablet Take 1 tablet (80 mg total) by mouth daily.   carvedilol (COREG) 12.5 MG tablet Take 1 tablet (12.5 mg total) by mouth 2 (two) times daily with a meal.   cloNIDine (CATAPRES) 0.3 MG tablet Take 1 tablet (0.3 mg total) by mouth 2 (two) times daily.   empagliflozin (JARDIANCE) 10 MG TABS tablet Take 1 tablet (10 mg total) by mouth daily before breakfast.   furosemide (LASIX) 40 MG  tablet Take 1 tablet (40 mg total) by mouth daily.   gabapentin (NEURONTIN) 100 MG capsule Take 1 capsule (100 mg total) by mouth 2 (two) times daily.   insulin degludec (TRESIBA FLEXTOUCH) 100 UNIT/ML FlexTouch Pen Inject 38-46 units daily as directed (increase by 1 unit every other day until sugar is below 150) (Patient taking differently: Inject into the skin daily. Inject 38-46 units daily as directed (increase by 1 unit every other day until sugar is below 150))   Insulin Pen Needle (PEN NEEDLES) 31G X 5 MM MISC Use daily with insulin Dx E11.9   isosorbide mononitrate (IMDUR) 60 MG 24 hr tablet Take 1 tablet (60 mg total) by mouth daily.   nitroGLYCERIN (NITROSTAT) 0.4 MG SL tablet Place 1 tablet (0.4 mg total) under the tongue every 5 (five) minutes as needed for chest pain.   ticagrelor (BRILINTA) 90 MG TABS tablet Take 1 tablet (90 mg total) by mouth 2 (two) times daily.   No facility-administered encounter medications on file as of 09/04/2021.    Patient Active Problem List   Diagnosis Date Noted   Morbid obesity ([place]) 08/19/2021   Coronary artery disease involving native coronary artery of native heart with angina pectoris (HCC)    Congestive heart failure (CHF) ([place]) 07/19/2021   Acute combined systolic and diastolic heart failure ([place]) 07/19/2021   CAD, multiple vessel 06/25/2021   Elevated troponin    DM (diabetes mellitus) type II, controlled, with peripheral vascular disorder (HCC)    Chronic kidney disease (CKD) stage G3b/A2, moderately decreased glomerular  filtration rate (GFR) between 30-44 mL/min/1.73 square meter and albuminuria creatinine ratio between 30-299 mg/g (HCC)    Unstable angina ([place]) 06/07/2021   NSTEMI (non-ST elevated myocardial infarction) ([place]) 06/07/2021   Chest pain 04/07/2021   Personal history of diabetic foot ulcer 06/18/2020   History of amputation of great toe ([place]) 06/18/2020   Osteomyelitis of great toe of right foot ([place])    Diabetic ulcer of  toe of right foot associated with diabetes mellitus due to underlying condition, with necrosis of bone ([place])    Osteomyelitis ([place]) 05/15/2020   AKI (acute kidney injury) ([place]) 05/15/2020   OSA (obstructive sleep apnea) 05/15/2020   Acute kidney failure, unspecified ([place]) 05/15/2020   Cellulitis of right foot 05/07/2020   Type 2 diabetes mellitus ([place]) 05/07/2020   Cellulitis of right lower extremity    Diabetic infection of right foot ([place])    Cellulitis of great toe, right    Diabetic ulcer of toe of right foot associated with type 2 diabetes mellitus, with fat layer exposed ([place]) 04/15/2020   OSA on CPAP 09/01/2018   S/P lumbar fusion 07/06/2018   DDD (degenerative disc disease), lumbar 02/28/2018   Hypertension associated with diabetes ([place]) 02/23/2017   Benign prostatic hyperplasia with incomplete bladder emptying 02/23/2017   Vitamin D deficiency 02/23/2017   Hyperlipidemia associated with type 2 diabetes mellitus ([place]) 02/23/2017   Type 2 diabetes mellitus with stage 2 chronic kidney disease, without long-term current use of insulin ([place]) 02/23/2017   Hyperlipidemia, unspecified 02/23/2017   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    Conditions to be addressed/monitored:CAD, HTN, DMII, and CKD Stage 3  Care Plan : [facility] Care Plan  Updates made by Ilean China, RN since 09/04/2021 12:00 AM     Problem: Chronic Disease Management Needs   Priority: Medium     Long-Range Goal: Patient will Work with [facility] Regarding Chronic Care Management and Care Coordination Associated with HTN, DM, CKD, and CAD   Recent Progress: On track  Priority: Medium  Note:   Current Barriers:  Chronic Disease Management support and education needs related to CAD, HTN, DMII, and CKD Stage 3  RNCM Clinical Goal(s):  Patient will verbalize understanding of plan for management of CAD, HTN, DMII, and CKD Stage 3  take all medications exactly as prescribed and will call provider for  medication related questions continue to work with RN Care Manager and/or Social Worker to address care management and care coordination needs related to CAD, HTN, DMII, and CKD Stage 3  through collaboration with Consulting civil engineer, provider, and care team.   Interventions: 1:1 collaboration with primary care provider regarding development and update of comprehensive plan of care as evidenced by provider attestation and co-signature Inter-disciplinary care team collaboration (see longitudinal plan of care) Evaluation of current treatment plan related to  self management and patient's adherence to plan as established by provider Medications reviewed and importance of compliance discussed.   CAD  (Status: Condition stable. Not addressed this visit.) Medications reviewed including medications utilized in CAD treatment plan Provided education on importance of blood pressure control in management of CAD; Advised to report any changes in symptoms or exercise tolerance Discussed use of sublingual nitroglycerin for chest pain and frequency of need Reviewed education on how to store and take nitro Discussed use of nitro Therapeutic listening utilized regarding recent heart attack, ED visit, and hospital stay Personal symptoms of recent heart attack reviewed. Discussed s/s to monitor for. Verbal education provided on  when to see appropriate medical attention for chest pain, shortness of breath, and any other s/s of a heart attack Discussed family/social support Discussed current physical activity level and ability to perform ADLs Feeling better. Was bailing hay when I called.  Discussed recent hospitalization and appt with cardiology Reviewed upcoming appointments Encouraged to reach out to cardiologist with any new or worsening symptoms or seek appropriate medical attention   Diabetes:  (Status: Goal on track: YES.) Lab Results  Component Value Date   HGBA1C 6.5 (H) 06/08/2021   HGBA1C 7.1 (H)  03/30/2021   HGBA1C 6.9 11/28/2020   Lab Results  Component Value Date   LDLCALC 61 07/21/2021   CREATININE 2.28 (H) 07/27/2021  Assessed patient's understanding of A1c goal: <7% Reinforced need to check and record blood pressure daily Advised patient to bring blood pressure log to PCP and nephrology visits Advised patient to call PCP or nephrologist with any readings outside of recommended range Reviewed and discussed upcoming appointment with cardiologist Encouraged patient to seek medical attention for any new or worsening symptoms Reviewed and discussed medications and cost. Vania Rea and [name] are expensive. Requesting samples if available.  Verbal education provided on Medicare Extra Help and Prescription Assistance through the drug manufacturers Collaborated with [facility] Clinical Staff regarding samples of Jardiance and [name] Encouraged patient to Call [facility] as needed   Hypertension: (Status: Condition stable. Not addressed this visit.) Last practice recorded BP readings:  BP Readings from Last 3 Encounters:  08/03/21 (!) 151/76  07/24/21 135/61  07/13/21 (!) 182/69   Reviewed and discussed medications Discussed episodes of hypotension Has not had anymore since last telephone visit Collaborated with Dr Lajuana Ripple regarding episodes of hypotension.  Ok to switch amlodipine to night F/u with cardiology if persists Encouraged patient to drink plenty of fluids and to avoid dehydration Encouraged increased physical activity as tolerated with a  goal of 150 min a week Advised that he can adjust med and take amlodipine at night, but since he hasn't had any additional episodes of hypotension, that it's ok to continue with current schedule Encouraged to f/u with cardiology if he has any future episodes of hypotension Encouraged to move carefully and change positions slowly to prevent orthostatic hypotension Reviewed upcoming appointments: cardio in 2/23   Chronic Kidney Disease  (Status: Goal on track: YES.)  Last practice recorded BP readings:  BP Readings from Last 3 Encounters:  08/03/21 (!) 151/76  07/24/21 135/61  07/13/21 (!) 182/69  Most recent eGFR/CrCl:  Lab Results  Component Value Date   EGFR 31 (L) 07/27/2021    No components found for: CRCL  Assessed the patient's understanding of chronic kidney disease    Evaluation of current treatment plan related to chronic kidney disease self management and patient's adherence to plan as established by provider      Reviewed medications with patient and discussed importance of compliance    Counseled on the importance of exercise goals with target of 150 minutes per week     Advised patient, providing education and rationale, to monitor blood pressure daily and record, calling PCP for findings outside established parameters    Discussed complications of poorly controlled blood pressure such as heart disease, stroke, circulatory complications, vision complications, kidney impairment, sexual dysfunction    Discussed recent visit with nephrologist and recommendation for a low phosphorous diet. Reinforced provider recommendations.  Verbal education provided on phosphorous and a low phosphorous diet Printed educational materials on a low phosphorous diet prepared and collaborated with  [facility] front office staff to print and mail to patient Encouraged patient to reach out to [facility] with any questions or concerns Reviewed and discussed medications: losartan was restarted. Discussed ARB role in kidney function.     Patient Goals/Self-Care Activities: Patient will self administer medications as prescribed Patient will attend all scheduled provider appointments Patient will call pharmacy for medication refills Patient will continue to perform ADL's independently Patient will continue to perform IADL's independently Patient will call provider office for new concerns or questions Patient will check and record blood pressure  daily and PRN and will call PCP with any readings outside of recommended range Patient will review printed educational material and call [place] with any questions [phone] Patient will check and record blood pressure daily and PRN and call PCP with any readings outside of recommended range       Plan:Telephone follow up appointment with care management team member scheduled for:  09/17/21 with RNCM The patient has been provided with contact information for the care management team and has been advised to call with any health related questions or concerns.   Chong Sicilian, BSN, RN-BC Embedded Chronic Care Manager [facility] / [place] Management Direct Dial: [phone]

## 2021-09-14 DIAGNOSIS — E1159 Type 2 diabetes mellitus with other circulatory complications: Secondary | ICD-10-CM | POA: Diagnosis not present

## 2021-09-14 DIAGNOSIS — I2510 Atherosclerotic heart disease of native coronary artery without angina pectoris: Secondary | ICD-10-CM | POA: Diagnosis not present

## 2021-09-14 DIAGNOSIS — E1122 Type 2 diabetes mellitus with diabetic chronic kidney disease: Secondary | ICD-10-CM | POA: Diagnosis not present

## 2021-09-14 DIAGNOSIS — N183 Chronic kidney disease, stage 3 unspecified: Secondary | ICD-10-CM | POA: Diagnosis not present

## 2021-09-14 DIAGNOSIS — N184 Chronic kidney disease, stage 4 (severe): Secondary | ICD-10-CM

## 2021-09-14 DIAGNOSIS — I152 Hypertension secondary to endocrine disorders: Secondary | ICD-10-CM

## 2021-09-15 ENCOUNTER — Telehealth: Payer: Self-pay | Admitting: *Deleted

## 2021-09-15 NOTE — Telephone Encounter (Signed)
09/15/2021  Patient requesting samples of Jardiance and [name] if available.   Forwarding to [facility] Clinical Staff for assistance.   Chong Sicilian, BSN, RN-BC Embedded Chronic Care Manager [facility] / [employer] Management Direct Dial: [phone]

## 2021-09-17 ENCOUNTER — Encounter: Payer: PPO | Admitting: *Deleted

## 2021-09-17 ENCOUNTER — Other Ambulatory Visit: Payer: Self-pay

## 2021-09-17 ENCOUNTER — Other Ambulatory Visit: Payer: PPO

## 2021-09-17 DIAGNOSIS — R808 Other proteinuria: Secondary | ICD-10-CM | POA: Diagnosis not present

## 2021-09-17 DIAGNOSIS — N19 Unspecified kidney failure: Secondary | ICD-10-CM | POA: Diagnosis not present

## 2021-09-17 DIAGNOSIS — E1122 Type 2 diabetes mellitus with diabetic chronic kidney disease: Secondary | ICD-10-CM | POA: Diagnosis not present

## 2021-09-17 DIAGNOSIS — R809 Proteinuria, unspecified: Secondary | ICD-10-CM | POA: Diagnosis not present

## 2021-09-17 DIAGNOSIS — I129 Hypertensive chronic kidney disease with stage 1 through stage 4 chronic kidney disease, or unspecified chronic kidney disease: Secondary | ICD-10-CM | POA: Diagnosis not present

## 2021-09-17 DIAGNOSIS — D631 Anemia in chronic kidney disease: Secondary | ICD-10-CM | POA: Diagnosis not present

## 2021-09-17 DIAGNOSIS — E1129 Type 2 diabetes mellitus with other diabetic kidney complication: Secondary | ICD-10-CM | POA: Diagnosis not present

## 2021-09-17 DIAGNOSIS — N170 Acute kidney failure with tubular necrosis: Secondary | ICD-10-CM | POA: Diagnosis not present

## 2021-09-17 DIAGNOSIS — N189 Chronic kidney disease, unspecified: Secondary | ICD-10-CM | POA: Diagnosis not present

## 2021-09-17 NOTE — Telephone Encounter (Signed)
Samples of Jardiance up front to pick patient aware

## 2021-09-23 ENCOUNTER — Ambulatory Visit (INDEPENDENT_AMBULATORY_CARE_PROVIDER_SITE_OTHER): Payer: PPO | Admitting: Podiatrist

## 2021-09-23 ENCOUNTER — Other Ambulatory Visit: Payer: Self-pay

## 2021-09-23 DIAGNOSIS — M2041 Other hammer toe(s) (acquired), right foot: Secondary | ICD-10-CM | POA: Diagnosis not present

## 2021-09-23 DIAGNOSIS — Z89419 Acquired absence of unspecified great toe: Secondary | ICD-10-CM

## 2021-09-23 DIAGNOSIS — E1149 Type 2 diabetes mellitus with other diabetic neurological complication: Secondary | ICD-10-CM

## 2021-09-23 DIAGNOSIS — M2042 Other hammer toe(s) (acquired), left foot: Secondary | ICD-10-CM | POA: Diagnosis not present

## 2021-09-23 NOTE — Progress Notes (Signed)
   The patient presented to the office to day to pick up diabetic shoes and 3 pr diabetic custom inserts.  1 pr of  inserts were put in the shoes and the shoes were fitted to the patient.   The foot ortheses offered full contact with plantar surface and contoured the arch well.   The shoes fit well with no heel slippage or areas of pressure concern.   Instructions for break in and wear were dispensed as well as instructions for changing out the diabetic insoles every 4 months.  The  delivery documentation and break in instruction forms were signed and a copy of the paperwork was given to the patient.    Patient advised to contact us if any problems arise.  Patient also advised on how to report any issues  

## 2021-10-16 ENCOUNTER — Other Ambulatory Visit (HOSPITAL_COMMUNITY): Payer: Self-pay

## 2021-10-16 ENCOUNTER — Other Ambulatory Visit: Payer: Self-pay | Admitting: Family Medicine

## 2021-10-19 DIAGNOSIS — Z0289 Encounter for other administrative examinations: Secondary | ICD-10-CM | POA: Diagnosis not present

## 2021-10-19 NOTE — Telephone Encounter (Signed)
Dr Marlou Porch' PA sent this medication in for 1 year supply to [place] back in 05/2021.  Please have them check with the pharmacy again.  Cardiology manages this med.

## 2021-10-20 NOTE — Telephone Encounter (Signed)
PT AWARE TO CALL CARDIOLOGIST

## 2021-10-21 ENCOUNTER — Encounter: Payer: Self-pay | Admitting: *Deleted

## 2021-10-27 DIAGNOSIS — Z794 Long term (current) use of insulin: Secondary | ICD-10-CM | POA: Diagnosis not present

## 2021-10-27 DIAGNOSIS — Z89411 Acquired absence of right great toe: Secondary | ICD-10-CM | POA: Diagnosis not present

## 2021-10-27 DIAGNOSIS — E119 Type 2 diabetes mellitus without complications: Secondary | ICD-10-CM | POA: Diagnosis not present

## 2021-10-27 DIAGNOSIS — Z6825 Body mass index (BMI) 25.0-25.9, adult: Secondary | ICD-10-CM | POA: Diagnosis not present

## 2021-10-27 DIAGNOSIS — I5040 Unspecified combined systolic (congestive) and diastolic (congestive) heart failure: Secondary | ICD-10-CM | POA: Diagnosis not present

## 2021-10-27 DIAGNOSIS — Z7984 Long term (current) use of oral hypoglycemic drugs: Secondary | ICD-10-CM | POA: Diagnosis not present

## 2021-10-27 DIAGNOSIS — D692 Other nonthrombocytopenic purpura: Secondary | ICD-10-CM | POA: Diagnosis not present

## 2021-10-28 ENCOUNTER — Other Ambulatory Visit: Payer: PPO

## 2021-10-28 DIAGNOSIS — E1122 Type 2 diabetes mellitus with diabetic chronic kidney disease: Secondary | ICD-10-CM | POA: Diagnosis not present

## 2021-10-28 DIAGNOSIS — R809 Proteinuria, unspecified: Secondary | ICD-10-CM | POA: Diagnosis not present

## 2021-10-28 DIAGNOSIS — D631 Anemia in chronic kidney disease: Secondary | ICD-10-CM | POA: Diagnosis not present

## 2021-10-28 DIAGNOSIS — N189 Chronic kidney disease, unspecified: Secondary | ICD-10-CM | POA: Diagnosis not present

## 2021-10-28 DIAGNOSIS — E1129 Type 2 diabetes mellitus with other diabetic kidney complication: Secondary | ICD-10-CM | POA: Diagnosis not present

## 2021-10-28 DIAGNOSIS — N170 Acute kidney failure with tubular necrosis: Secondary | ICD-10-CM | POA: Diagnosis not present

## 2021-10-28 DIAGNOSIS — I129 Hypertensive chronic kidney disease with stage 1 through stage 4 chronic kidney disease, or unspecified chronic kidney disease: Secondary | ICD-10-CM | POA: Diagnosis not present

## 2021-10-31 ENCOUNTER — Other Ambulatory Visit: Payer: Self-pay | Admitting: Family Medicine

## 2021-10-31 DIAGNOSIS — E1159 Type 2 diabetes mellitus with other circulatory complications: Secondary | ICD-10-CM

## 2021-11-03 DIAGNOSIS — D631 Anemia in chronic kidney disease: Secondary | ICD-10-CM | POA: Diagnosis not present

## 2021-11-03 DIAGNOSIS — I129 Hypertensive chronic kidney disease with stage 1 through stage 4 chronic kidney disease, or unspecified chronic kidney disease: Secondary | ICD-10-CM | POA: Diagnosis not present

## 2021-11-03 DIAGNOSIS — N19 Unspecified kidney failure: Secondary | ICD-10-CM | POA: Diagnosis not present

## 2021-11-03 DIAGNOSIS — N189 Chronic kidney disease, unspecified: Secondary | ICD-10-CM | POA: Diagnosis not present

## 2021-11-03 DIAGNOSIS — R808 Other proteinuria: Secondary | ICD-10-CM | POA: Diagnosis not present

## 2021-11-03 DIAGNOSIS — E1122 Type 2 diabetes mellitus with diabetic chronic kidney disease: Secondary | ICD-10-CM | POA: Diagnosis not present

## 2021-11-05 ENCOUNTER — Encounter: Payer: Self-pay | Admitting: *Deleted

## 2021-11-05 ENCOUNTER — Ambulatory Visit (INDEPENDENT_AMBULATORY_CARE_PROVIDER_SITE_OTHER): Payer: PPO | Admitting: *Deleted

## 2021-11-05 DIAGNOSIS — E1159 Type 2 diabetes mellitus with other circulatory complications: Secondary | ICD-10-CM

## 2021-11-05 DIAGNOSIS — E1122 Type 2 diabetes mellitus with diabetic chronic kidney disease: Secondary | ICD-10-CM

## 2021-11-05 DIAGNOSIS — N184 Chronic kidney disease, stage 4 (severe): Secondary | ICD-10-CM

## 2021-11-06 NOTE — Patient Instructions (Signed)
Visit Information  Patient Goals/Self-Care Activities: Patient will self administer medications as prescribed Patient will attend all scheduled provider appointments Patient will call pharmacy for medication refills Patient will continue to perform ADL's independently Patient will continue to perform IADL's independently Patient will call provider office for new concerns or questions Patient will check and record blood pressure daily and PRN and will call PCP with any readings outside of recommended range Patient will check and record blood sugar daily and as needed and will call PCP with any readings outside of recommended range Patient will check and record blood pressure daily and PRN and call PCP with any readings outside of recommended range  Patient verbalizes understanding of instructions provided today and agrees to view in [place].   Plan:Telephone follow up appointment with care management team member scheduled for:  12/03/2021 with RNCM The patient has been provided with contact information for the care management team and has been advised to call with any health related questions or concerns.   Chong Sicilian, BSN, RN-BC Embedded Chronic Care Manager [facility] / [place] Management Direct Dial: [phone]

## 2021-11-06 NOTE — Chronic Care Management (AMB) (Signed)
Chronic Care Management   CCM RN Visit Note  11/05/2021 Name: [name] MRN: [id number] DOB: 31-Mar-1956  Subjective: [name] is a 66 y.o. year old male who is a primary care patient of [name], DO. The care management team was consulted for assistance with disease management and care coordination needs.    Engaged with patient by telephone for follow up visit in response to provider referral for case management and/or care coordination services.   Consent to Services:  The patient was given information about Chronic Care Management services, agreed to services, and gave verbal consent prior to initiation of services.  Please see initial visit note for detailed documentation.   Patient agreed to services and verbal consent obtained.   Assessment: Review of patient past medical history, allergies, medications, health status, including review of consultants reports, laboratory and other test data, was performed as part of comprehensive evaluation and provision of chronic care management services.   SDOH (Social Determinants of Health) assessments and interventions performed:    CCM Care Plan  No Known Allergies  Outpatient Encounter Medications as of 11/05/2021  Medication Sig   losartan (COZAAR) 25 MG tablet Take 25 mg by mouth daily.   amLODipine (NORVASC) 10 MG tablet Take 1 tablet (10 mg total) by mouth daily.   amLODipine (NORVASC) 10 MG tablet Take by mouth.   aspirin 81 MG EC tablet Take 1 tablet (81 mg total) by mouth daily. Swallow whole.   atorvastatin (LIPITOR) 80 MG tablet Take 1 tablet (80 mg total) by mouth daily.   carvedilol (COREG) 12.5 MG tablet Take 1 tablet (12.5 mg total) by mouth 2 (two) times daily with a meal.   cloNIDine (CATAPRES) 0.3 MG tablet Take 1 tablet (0.3 mg total) by mouth 2 (two) times daily.   empagliflozin (JARDIANCE) 10 MG TABS tablet Take 1 tablet (10 mg total) by mouth daily before breakfast.   furosemide  (LASIX) 40 MG tablet Take 1 tablet (40 mg total) by mouth daily.   gabapentin (NEURONTIN) 100 MG capsule Take 1 capsule (100 mg total) by mouth 2 (two) times daily.   insulin degludec (TRESIBA FLEXTOUCH) 100 UNIT/ML FlexTouch Pen Inject 38-46 units daily as directed (increase by 1 unit every other day until sugar is below 150) (Patient taking differently: Inject into the skin daily. Inject 38-46 units daily as directed (increase by 1 unit every other day until sugar is below 150))   Insulin Pen Needle (PEN NEEDLES) 31G X 5 MM MISC Use daily with insulin Dx E11.9   isosorbide mononitrate (IMDUR) 60 MG 24 hr tablet Take 1 tablet (60 mg total) by mouth daily.   losartan (COZAAR) 25 MG tablet Take by mouth.   nitroGLYCERIN (NITROSTAT) 0.4 MG SL tablet Place 1 tablet (0.4 mg total) under the tongue every 5 (five) minutes as needed for chest pain.   ticagrelor (BRILINTA) 90 MG TABS tablet Take 1 tablet (90 mg total) by mouth 2 (two) times daily.   No facility-administered encounter medications on file as of 11/05/2021.    Patient Active Problem List   Diagnosis Date Noted   Morbid obesity ([place]) 08/19/2021   Coronary artery disease involving native coronary artery of native heart with angina pectoris (HCC)    Congestive heart failure (CHF) ([place]) 07/19/2021   Acute combined systolic and diastolic heart failure ([place]) 07/19/2021   CAD, multiple vessel 06/25/2021   Elevated troponin    DM (diabetes mellitus) type II, controlled, with peripheral vascular disorder (  [place])    Chronic kidney disease (CKD) stage G3b/A2, moderately decreased glomerular filtration rate (GFR) between 30-44 mL/min/1.73 square meter and albuminuria creatinine ratio between 30-299 mg/g (HCC)    Unstable angina ([place]) 06/07/2021   NSTEMI (non-ST elevated myocardial infarction) ([place]) 06/07/2021   Chest pain 04/07/2021   Personal history of diabetic foot ulcer 06/18/2020   History of amputation of great toe ([place]) 06/18/2020    Osteomyelitis of great toe of right foot ([place])    Diabetic ulcer of toe of right foot associated with diabetes mellitus due to underlying condition, with necrosis of bone ([place])    Osteomyelitis ([place]) 05/15/2020   AKI (acute kidney injury) ([place]) 05/15/2020   OSA (obstructive sleep apnea) 05/15/2020   Acute kidney failure, unspecified ([place]) 05/15/2020   Cellulitis of right foot 05/07/2020   Type 2 diabetes mellitus ([place]) 05/07/2020   Cellulitis of right lower extremity    Diabetic infection of right foot ([place])    Cellulitis of great toe, right    Diabetic ulcer of toe of right foot associated with type 2 diabetes mellitus, with fat layer exposed ([place]) 04/15/2020   OSA on CPAP 09/01/2018   S/P lumbar fusion 07/06/2018   DDD (degenerative disc disease), lumbar 02/28/2018   Hypertension associated with diabetes ([place]) 02/23/2017   Benign prostatic hyperplasia with incomplete bladder emptying 02/23/2017   Vitamin D deficiency 02/23/2017   Hyperlipidemia associated with type 2 diabetes mellitus ([place]) 02/23/2017   Type 2 diabetes mellitus with stage 2 chronic kidney disease, without long-term current use of insulin ([place]) 02/23/2017   Hyperlipidemia, unspecified 02/23/2017   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    Conditions to be addressed/monitored:HTN, DMII, and CKD Stage 3  Care Plan : [facility] Care Plan     Problem: Chronic Disease Management Needs   Priority: High     Long-Range Goal: Patient will Work with [facility] Regarding Chronic Care Management and Care Coordination Associated with HTN, DM, CKD, and CAD   This Visit's Progress: On track  Recent Progress: On track  Priority: High  Note:   Current Barriers:  Chronic Disease Management support and education needs related to CAD, HTN, DMII, and CKD Stage 3  RNCM Clinical Goal(s):  Patient will verbalize understanding of plan for management of CAD, HTN, DMII, and CKD Stage 3  take all medications exactly as prescribed and  will call provider for medication related questions continue to work with RN Care Manager and/or Social Worker to address care management and care coordination needs related to CAD, HTN, DMII, and CKD Stage 3  through collaboration with Consulting civil engineer, provider, and care team.   Interventions: 1:1 collaboration with primary care provider regarding development and update of comprehensive plan of care as evidenced by provider attestation and co-signature Inter-disciplinary care team collaboration (see longitudinal plan of care) Evaluation of current treatment plan related to  self management and patient's adherence to plan as established by provider Medications reviewed and importance of compliance discussed.   CAD  (Status: Condition stable. Not addressed this visit.) Medications reviewed including medications utilized in CAD treatment plan Provided education on importance of blood pressure control in management of CAD; Advised to report any changes in symptoms or exercise tolerance Discussed use of sublingual nitroglycerin for chest pain and frequency of need Reviewed education on how to store and take nitro Discussed use of nitro Therapeutic listening utilized regarding recent heart attack, ED visit, and hospital stay Personal symptoms of recent heart attack reviewed. Discussed s/s to  monitor for. Verbal education provided on when to see appropriate medical attention for chest pain, shortness of breath, and any other s/s of a heart attack Discussed family/social support Discussed current physical activity level and ability to perform ADLs Feeling better. Was bailing hay when I called.  Discussed recent hospitalization and appt with cardiology Reviewed upcoming appointments Encouraged to reach out to cardiologist with any new or worsening symptoms or seek appropriate medical attention   Diabetes:  (Status: Goal on Track (progressing): YES.) Lab Results  Component Value Date   HGBA1C  6.5 (H) 06/08/2021   HGBA1C 7.1 (H) 03/30/2021   HGBA1C 6.9 11/28/2020   Lab Results  Component Value Date   LDLCALC 61 07/21/2021   CREATININE 2.28 (H) 07/27/2021  Assessed patient's understanding of A1c goal: <7% Reinforced need to check and record sugar daily and to call PCP with any readings outside of recommended range Reviewed and discussed medications and cost. Vania Rea and [name] are expensive.  Collaborated with Lottie Dawson, PharmD to put samples of Jardiance at the front desk for pickup. Patient will be able to purchase after medicare coverage gap resets at the beginning of the year  Reviewed and discussed most recent PCP office visit and laboratory results Advised that follow-up is due Collaborated with [facility] front office staff to schedule an appointment in first available spot, which is in 12/2021   Hypertension: (Status: Goal on Track (progressing): YES.) Long Term Goal Last practice recorded BP readings:  BP Readings from Last 3 Encounters:  08/03/21 (!) 151/76  07/24/21 135/61  07/13/21 (!) 182/69   Reviewed and discussed medications Advised patient to bring blood pressure log to PCP and nephrology visits Advised patient to call PCP or nephrologist with any readings outside of recommended range Reviewed and discussed upcoming appointment with cardiologist Encouraged patient to drink plenty of fluids and to avoid dehydration Encouraged increased physical activity as tolerated with a  goal of 150 min a week Assessed for episodes of hypotension. No recurrence since last visit.  Encouraged to f/u with cardiology if he has any future episodes of hypotension Encouraged to move carefully and change positions slowly to prevent orthostatic hypotension Reviewed upcoming appointments: cardio in 2/23   Chronic Kidney Disease (Status: Goal on track: YES.) Long Term Goal Last practice recorded BP readings:  Lab Results  Component Value Date   CREATININE 2.28 (H) 07/27/2021    BUN 32 (H) 07/27/2021   NA 138 07/27/2021   K 4.7 07/27/2021   CL 99 07/27/2021   CO2 24 07/27/2021  Assessed the patient's understanding of chronic kidney disease    Evaluation of current treatment plan related to chronic kidney disease self management and patient's adherence to plan as established by provider      Reviewed medications with patient and discussed importance of compliance    Counseled on the importance of exercise goals with target of 150 minutes per week     Advised patient, providing education and rationale, to monitor blood pressure daily and record, calling PCP for findings outside established parameters    Discussed complications of poorly controlled blood pressure such as heart disease, stroke, circulatory complications, vision complications, kidney impairment, sexual dysfunction    Discussed recent visit with nephrologist and laboratory results. Creatinine has improved some. Encouraged patient to reach out to [facility] with any questions or concerns Reviewed and discussed medications: losartan was restarted. Discussed ARB role in kidney function.     Patient Goals/Self-Care Activities: Patient will self administer medications as prescribed  Patient will attend all scheduled provider appointments Patient will call pharmacy for medication refills Patient will continue to perform ADL's independently Patient will continue to perform IADL's independently Patient will call provider office for new concerns or questions Patient will check and record blood pressure daily and PRN and will call PCP with any readings outside of recommended range Patient will check and record blood sugar daily and as needed and will call PCP with any readings outside of recommended range Patient will check and record blood pressure daily and PRN and call PCP with any readings outside of recommended range   Plan:Telephone follow up appointment with care management team member scheduled for:   12/03/2021 with RNCM The patient has been provided with contact information for the care management team and has been advised to call with any health related questions or concerns.   Chong Sicilian, BSN, RN-BC Embedded Chronic Care Manager [facility] [place] Management Direct Dial: [phone]

## 2021-11-14 DIAGNOSIS — I152 Hypertension secondary to endocrine disorders: Secondary | ICD-10-CM | POA: Diagnosis not present

## 2021-11-14 DIAGNOSIS — N183 Chronic kidney disease, stage 3 unspecified: Secondary | ICD-10-CM | POA: Diagnosis not present

## 2021-11-14 DIAGNOSIS — E1122 Type 2 diabetes mellitus with diabetic chronic kidney disease: Secondary | ICD-10-CM

## 2021-11-14 DIAGNOSIS — N184 Chronic kidney disease, stage 4 (severe): Secondary | ICD-10-CM | POA: Diagnosis not present

## 2021-11-14 DIAGNOSIS — E1159 Type 2 diabetes mellitus with other circulatory complications: Secondary | ICD-10-CM

## 2021-11-30 ENCOUNTER — Other Ambulatory Visit: Payer: Self-pay | Admitting: *Deleted

## 2021-11-30 DIAGNOSIS — E1122 Type 2 diabetes mellitus with diabetic chronic kidney disease: Secondary | ICD-10-CM

## 2021-11-30 MED ORDER — TRESIBA FLEXTOUCH 100 UNIT/ML ~~LOC~~ SOPN: PEN_INJECTOR | SUBCUTANEOUS | 1 refills | Status: DC

## 2021-12-02 ENCOUNTER — Other Ambulatory Visit: Payer: Self-pay | Admitting: Family Medicine

## 2021-12-02 DIAGNOSIS — I152 Hypertension secondary to endocrine disorders: Secondary | ICD-10-CM

## 2021-12-02 DIAGNOSIS — E1159 Type 2 diabetes mellitus with other circulatory complications: Secondary | ICD-10-CM

## 2021-12-03 ENCOUNTER — Telehealth: Payer: PPO

## 2021-12-17 ENCOUNTER — Encounter: Payer: Self-pay | Admitting: Family Medicine

## 2021-12-17 ENCOUNTER — Ambulatory Visit (INDEPENDENT_AMBULATORY_CARE_PROVIDER_SITE_OTHER): Payer: PPO | Admitting: Family Medicine

## 2021-12-17 VITALS — BP 112/63 | HR 58 | Temp 98.5°F | Ht 72.0 in | Wt 201.6 lb

## 2021-12-17 DIAGNOSIS — I2510 Atherosclerotic heart disease of native coronary artery without angina pectoris: Secondary | ICD-10-CM | POA: Diagnosis not present

## 2021-12-17 DIAGNOSIS — E1122 Type 2 diabetes mellitus with diabetic chronic kidney disease: Secondary | ICD-10-CM | POA: Diagnosis not present

## 2021-12-17 DIAGNOSIS — E785 Hyperlipidemia, unspecified: Secondary | ICD-10-CM

## 2021-12-17 DIAGNOSIS — E1169 Type 2 diabetes mellitus with other specified complication: Secondary | ICD-10-CM | POA: Diagnosis not present

## 2021-12-17 DIAGNOSIS — N184 Chronic kidney disease, stage 4 (severe): Secondary | ICD-10-CM | POA: Diagnosis not present

## 2021-12-17 DIAGNOSIS — I152 Hypertension secondary to endocrine disorders: Secondary | ICD-10-CM

## 2021-12-17 DIAGNOSIS — E1159 Type 2 diabetes mellitus with other circulatory complications: Secondary | ICD-10-CM | POA: Diagnosis not present

## 2021-12-17 LAB — BAYER DCA HB A1C WAIVED: HB A1C (BAYER DCA - WAIVED): 8.7 % — ABNORMAL HIGH (ref 4.8–5.6)

## 2021-12-17 MED ORDER — ALPHA-LIPOIC ACID 600 MG PO TABS: 600.0000 mg | ORAL_TABLET | Freq: Every day | ORAL | 3 refills | Status: DC

## 2021-12-17 NOTE — Patient Instructions (Signed)
You had labs performed today.  You will be contacted with the results of the labs once they are available, usually in the next 3 business days for routine lab work.  If you have an active my chart account, they will be released to your MyChart.  If you prefer to have these labs released to you via telephone, please let us know.     

## 2021-12-17 NOTE — Progress Notes (Signed)
Subjective: CC:DM PCP: [name], DO JME:QASTMHD [name] is a 66 y.o. male presenting to clinic today for:  1. Type 2 Diabetes with hypertension, hyperlipidemia associated with CKD:  Patient reports compliance with his losartan 25 mg daily, Brilinta 90 mg twice daily, [place] and Jardiance.  Also treated with Coreg, Lipitor and amlodipine.  He admits that blood sugars have been running around the 200s.  He has had no hypoglycemic episodes.  He denies any chest pain, shortness of breath, heart palpitations, edema or falls but does admit to some neuropathy  Last eye exam: Up-to-date.  Will be due again in December Last foot exam: Up-to-date.  Not due until September Last A1c:  7.1 Nephropathy screen indicated?: has CKD4 Last flu, zoster and/or pneumovax:  Immunization History  Administered Date(s) Administered   Influenza,inj,Quad PF,6+ Mos 08/25/2017, 09/01/2018   Influenza,inj,quad, With Preservative 09/15/2017, 09/11/2018   Pneumococcal Polysaccharide-23 04/14/2017        ROS: Per HPI  No Known Allergies Past Medical History:  Diagnosis Date   Chronic kidney disease    Complication of anesthesia    Hard to wake   Degenerative joint disease (DJD) of lumbar spine    Diabetes mellitus without complication ([place])    Type II   Essential hypertension 02/23/2017   Lumbar stenosis    NSTEMI (non-ST elevated myocardial infarction) ([place]) 06/07/2021    Current Outpatient Medications:    amLODipine (NORVASC) 10 MG tablet, Take 1 tablet (10 mg total) by mouth daily., Disp: 90 tablet, Rfl: 1   amLODipine (NORVASC) 10 MG tablet, Take by mouth., Disp: , Rfl:    aspirin 81 MG EC tablet, Take 1 tablet (81 mg total) by mouth daily. Swallow whole., Disp: 90 tablet, Rfl: 3   atorvastatin (LIPITOR) 80 MG tablet, Take 1 tablet (80 mg total) by mouth daily., Disp: 90 tablet, Rfl: 1   carvedilol (COREG) 12.5 MG tablet, Take 1 tablet (12.5 mg total) by mouth 2 (two) times daily  with a meal., Disp: 60 tablet, Rfl: 2   cloNIDine (CATAPRES) 0.3 MG tablet, TAKE (1) TABLET TWICE A DAY., Disp: 60 tablet, Rfl: 0   empagliflozin (JARDIANCE) 10 MG TABS tablet, Take 1 tablet (10 mg total) by mouth daily before breakfast., Disp: 30 tablet, Rfl: 6   furosemide (LASIX) 40 MG tablet, Take 1 tablet (40 mg total) by mouth daily., Disp: 30 tablet, Rfl: 1   gabapentin (NEURONTIN) 100 MG capsule, Take 1 capsule (100 mg total) by mouth 2 (two) times daily., Disp: 180 capsule, Rfl: 2   insulin degludec (TRESIBA FLEXTOUCH) 100 UNIT/ML FlexTouch Pen, Inject 38-50  units daily as directed (increase by 1 unit every other day until sugar is below 150), Disp: 15 mL, Rfl: 1   Insulin Pen Needle (PEN NEEDLES) 31G X 5 MM MISC, Use daily with insulin Dx E11.9, Disp: 100 each, Rfl: 3   isosorbide mononitrate (IMDUR) 60 MG 24 hr tablet, Take 1 tablet (60 mg total) by mouth daily., Disp: 90 tablet, Rfl: 1   losartan (COZAAR) 25 MG tablet, Take by mouth., Disp: , Rfl:    losartan (COZAAR) 25 MG tablet, Take 25 mg by mouth daily., Disp: , Rfl:    nitroGLYCERIN (NITROSTAT) 0.4 MG SL tablet, Place 1 tablet (0.4 mg total) under the tongue every 5 (five) minutes as needed for chest pain., Disp: 25 tablet, Rfl: 3   ticagrelor (BRILINTA) 90 MG TABS tablet, Take 1 tablet (90 mg total) by mouth 2 (two) times daily.,  Disp: 180 tablet, Rfl: 3 Social History   Socioeconomic History   Marital status: Married    Spouse name: Not on file   Number of children: 3   Years of education: Not on file   Highest education level: Not on file  Occupational History   Not on file  Tobacco Use   Smoking status: Never   Smokeless tobacco: Never  Vaping Use   Vaping Use: Never used  Substance and Sexual Activity   Alcohol use: Yes    Alcohol/week: 6.0 standard drinks    Types: 6 Cans of beer per week   Drug use: No   Sexual activity: Yes  Other Topics Concern   Not on file  Social History Narrative   Lives with his  wife - Has a son with cirrhosis who he stays with a lot. Has another son and one daughter    Social Determinants of Radio broadcast assistant Strain: Low Risk    Difficulty of Paying Living Expenses: Not hard at all  Food Insecurity: No Food Insecurity   Worried About Charity fundraiser in the Last Year: Never true   Arboriculturist in the Last Year: Never true  Transportation Needs: No Transportation Needs   Lack of Transportation (Medical): No   Lack of Transportation (Non-Medical): No  Physical Activity: Sufficiently Active   Days of Exercise per Week: 7 days   Minutes of Exercise per Session: 60 min  Stress: No Stress Concern Present   Feeling of Stress : Not at all  Social Connections: Moderately Isolated   Frequency of Communication with Friends and Family: More than three times a week   Frequency of Social Gatherings with Friends and Family: More than three times a week   Attends Religious Services: Never   Marine scientist or Organizations: No   Attends Music therapist: Never   Marital Status: Married  Human resources officer Violence: Not At Risk   Fear of Current or Ex-Partner: No   Emotionally Abused: No   Physically Abused: No   Sexually Abused: No   Family History  Problem Relation Age of Onset   Heart attack Father 66       Died suddenly   Hypertension Father    Vision loss Sister        one eye   Cancer Brother        skin   Diabetes Brother     Objective: Office vital signs reviewed. BP 112/63    Pulse (!) 58    Temp 98.5 F (36.9 C)    Ht 6' (1.829 m)    Wt 201 lb 9.6 oz (91.4 kg)    SpO2 99%    BMI 27.34 kg/m   Physical Examination:  General: Awake, alert, Nontoxic-appearing male, No acute distress HEENT: Sclera white.  Moist mucous membranes Cardio: bradycardia with regular rhythm, S1S2 heard, no murmurs appreciated Pulm: clear to auscultation bilaterally, no wheezes, rhonchi or rales; normal work of breathing on room  air Extremities: warm, well perfused, No edema, cyanosis or clubbing; +2 pulses bilaterally  Lab Results  Component Value Date   HGBA1C 8.7 (H) 12/17/2021    Assessment/ Plan: 66 y.o. male   Type 2 diabetes mellitus with stage 4 chronic kidney disease, without long-term current use of insulin ([place]) - Plan: Renal Function Panel, CBC, Bayer DCA Hb A1c Waived, VITAMIN D 25 Hydroxy (Vit-D Deficiency, Fractures)  Hypertension associated with diabetes ([place]) - Plan: Renal  Function Panel  Hyperlipidemia associated with type 2 diabetes mellitus (HCC)  CAD, multiple vessel  Diabetes is not controlled.  A1c up to 8.7.  This is a very large rise since his check a few months ago.  I am advising him to increase his [place] by 2 units.  Carb restrict.  Increase water intake.  Increase physical activity as tolerated.  Continue Jardiance 10 mg daily.  Would like to conduct a visit with him in the next couple weeks with blood sugar log review.  We will continue advancing his insulin pending his BG's.  Alpha lipoic acid 600 mg sent for neuropathy  Blood pressure is controlled.  No changes  Continue statin.  Orders Placed This Encounter  Procedures   Renal Function Panel   CBC   Bayer DCA Hb A1c Waived   VITAMIN D 25 Hydroxy (Vit-D Deficiency, Fractures)   No orders of the defined types were placed in this encounter.    [name], DO [place] [phone]

## 2021-12-18 LAB — RENAL FUNCTION PANEL
Albumin: 4.3 g/dL (ref 3.8–4.8)
BUN/Creatinine Ratio: 15 (ref 10–24)
BUN: 29 mg/dL — ABNORMAL HIGH (ref 8–27)
CO2: 25 mmol/L (ref 20–29)
Calcium: 9.4 mg/dL (ref 8.6–10.2)
Chloride: 104 mmol/L (ref 96–106)
Creatinine, Ser: 1.9 mg/dL — ABNORMAL HIGH (ref 0.76–1.27)
Glucose: 198 mg/dL — ABNORMAL HIGH (ref 70–99)
Phosphorus: 4.5 mg/dL — ABNORMAL HIGH (ref 2.8–4.1)
Potassium: 3.8 mmol/L (ref 3.5–5.2)
Sodium: 143 mmol/L (ref 134–144)
eGFR: 38 mL/min/{1.73_m2} — ABNORMAL LOW (ref >59)

## 2021-12-18 LAB — CBC
Hematocrit: 32.8 % — ABNORMAL LOW (ref 37.5–51.0)
Hemoglobin: 11 g/dL — ABNORMAL LOW (ref 13.0–17.7)
MCH: 28.4 pg (ref 26.6–33.0)
MCHC: 33.5 g/dL (ref 31.5–35.7)
MCV: 85 fL (ref 79–97)
Platelets: 201 10*3/uL (ref 150–450)
RBC: 3.88 x10E6/uL — ABNORMAL LOW (ref 4.14–5.80)
RDW: 14 % (ref 11.6–15.4)
WBC: 9.8 10*3/uL (ref 3.4–10.8)

## 2021-12-18 LAB — VITAMIN D 25 HYDROXY (VIT D DEFICIENCY, FRACTURES): Vit D, 25-Hydroxy: 35.2 ng/mL (ref 30.0–100.0)

## 2021-12-22 NOTE — Progress Notes (Signed)
Cardiology Office Note   Date:  12/23/2021   ID:  [name] 29-May-1956, MRN [id number]  PCP:  [name], DO  Cardiologist:   Minus Breeding, MD Referring:  [name], DO  Chief Complaint  Patient presents with   Coronary Artery Disease       History of Present Illness: [name] is a 66 y.o. male who presents for follow up of CAD.  I saw him previously and he had chest pain but a negative perfusions study.  He presented a few weeks after this with chest pain.   He ruled in for NSTEMI and was found to have disease as below and is status post PTCA.  Of note his EF was normal.  He was in the hospital in Sept with acute diastolic HF.  He had another cath and the anatomy was unchanged.    Since I last saw him he has been active.  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  He just had calving season when the one of his cows died giving birth.  He denies any new cardiovascular symptoms. The patient denies any new symptoms such as chest discomfort, neck or arm discomfort. There has been no new shortness of breath, PND or orthopnea. There have been no reported palpitations, presyncope or syncope.  He gets some twinges of chest discomfort but nothing like he had in July.   Past Medical History:  Diagnosis Date   Chronic kidney disease    Complication of anesthesia    Hard to wake   Degenerative joint disease (DJD) of lumbar spine    Diabetes mellitus without complication ([place])    Type II   Essential hypertension 02/23/2017   Lumbar stenosis    NSTEMI (non-ST elevated myocardial infarction) ([place]) 06/07/2021    Past Surgical History:  Procedure Laterality Date   AMPUTATION TOE Right 05/17/2020   Procedure: AMPUTATION TOE partial right great toe;  Surgeon: Evelina Bucy, DPM;  Location: [facility];  Service: Podiatry;  Laterality: Right;   BELPHAROPTOSIS REPAIR     CARDIAC CATHETERIZATION     CORONARY STENT INTERVENTION N/A 06/11/2021   Procedure:  CORONARY STENT INTERVENTION;  Surgeon: Martinique, Peter M, MD;  Location: [place] [facility];  Service: Cardiovascular;  Laterality: N/A;   EYE SURGERY     HEMORRHOID SURGERY     INTRAVASCULAR ULTRASOUND/IVUS N/A 06/11/2021   Procedure: Intravascular Ultrasound/IVUS;  Surgeon: Martinique, Peter M, MD;  Location: [place] [facility];  Service: Cardiovascular;  Laterality: N/A;   LEFT HEART CATH AND CORONARY ANGIOGRAPHY N/A 06/09/2021   Procedure: LEFT HEART CATH AND CORONARY ANGIOGRAPHY;  Surgeon: Martinique, Peter M, MD;  Location: [facility];  Service: Cardiovascular;  Laterality: N/A;   LEFT HEART CATH AND CORONARY ANGIOGRAPHY N/A 07/23/2021   Procedure: LEFT HEART CATH AND CORONARY ANGIOGRAPHY;  Surgeon: Lorretta Harp, MD;  Location: [place] [facility];  Service: Cardiovascular;  Laterality: N/A;   skin cancer removed right ear     TRANSFORAMINAL LUMBAR INTERBODY FUSION (TLIF) WITH PEDICLE SCREW FIXATION 2 LEVEL N/A 07/06/2018   Procedure: TRANSFORAMINAL LUMBAR INTERBODY FUSION (TLIF) L4-S1;  Surgeon: Melina Schools, MD;  Location: [place] [facility];  Service: Orthopedics;  Laterality: N/A;     Current Outpatient Medications  Medication Sig Dispense Refill   Alpha-Lipoic Acid 600 MG TABS Take 600 mg by mouth daily. For NEUROPATHY 90 tablet 3   amLODipine (NORVASC) 10 MG tablet Take 1 tablet (10 mg  total) by mouth daily. 90 tablet 1   aspirin 81 MG EC tablet Take 1 tablet (81 mg total) by mouth daily. Swallow whole. 90 tablet 3   atorvastatin (LIPITOR) 80 MG tablet Take 1 tablet (80 mg total) by mouth daily. 90 tablet 1   carvedilol (COREG) 12.5 MG tablet Take 1 tablet (12.5 mg total) by mouth 2 (two) times daily with a meal. 60 tablet 2   cloNIDine (CATAPRES) 0.3 MG tablet TAKE (1) TABLET TWICE A DAY. 60 tablet 0   clopidogrel (PLAVIX) 75 MG tablet Take 1 tablet (75 mg total) by mouth daily. 90 tablet 3   empagliflozin (JARDIANCE) 10 MG TABS tablet Take 1 tablet (10 mg total) by mouth daily before  breakfast. 30 tablet 6   furosemide (LASIX) 40 MG tablet Take 1 tablet (40 mg total) by mouth daily. 30 tablet 1   gabapentin (NEURONTIN) 100 MG capsule Take 1 capsule (100 mg total) by mouth 2 (two) times daily. 180 capsule 2   insulin degludec (TRESIBA FLEXTOUCH) 100 UNIT/ML FlexTouch Pen Inject 38-50  units daily as directed (increase by 1 unit every other day until sugar is below 150) 15 mL 1   Insulin Pen Needle (PEN NEEDLES) 31G X 5 MM MISC Use daily with insulin Dx E11.9 100 each 3   isosorbide mononitrate (IMDUR) 60 MG 24 hr tablet Take 1 tablet (60 mg total) by mouth daily. 90 tablet 1   losartan (COZAAR) 25 MG tablet Take by mouth.     nitroGLYCERIN (NITROSTAT) 0.4 MG SL tablet Place 1 tablet (0.4 mg total) under the tongue every 5 (five) minutes as needed for chest pain. 25 tablet 3   No current facility-administered medications for this visit.    Allergies:   Patient has no known allergies.    ROS:  Please see the history of present illness.   Otherwise, review of systems are positive for none.   All other systems are reviewed and negative.    PHYSICAL EXAM: VS:  BP (!) 168/74 (BP Location: Right Arm, Patient Position: Sitting, Cuff Size: Normal)    Pulse 63    Wt 206 lb (93.4 kg)    SpO2 99%    BMI 27.94 kg/m  , BMI Body mass index is 27.94 kg/m. GENERAL:  Well appearing NECK:  No jugular venous distention, waveform within normal limits, carotid upstroke brisk and symmetric, no bruits, no thyromegaly LUNGS:  Clear to auscultation bilaterally CHEST:  Unremarkable HEART:  PMI not displaced or sustained,S1 and S2 within normal limits, no S3, no S4, no clicks, no rubs, no murmurs ABD:  Flat, positive bowel sounds normal in frequency in pitch, no bruits, no rebound, no guarding, no midline pulsatile mass, no hepatomegaly, no splenomegaly EXT:  2 plus pulses throughout, no edema, no cyanosis no clubbing    Cardiac cath:   Diagnostic Dominance: Right         Staged  intervention 06/11/21:     1st Mrg lesion is 95% stenosed.   A drug-eluting stent was successfully placed using a STENT ONYX FRONTIER 2.75X26.   Post intervention, there is a 0% residual stenosis.   Ost Cx to Prox Cx lesion is 65% stenosed.   A drug-eluting stent was successfully placed using a STENT ONYX FRONTIER 3.0X22.   Post intervention, there is a 0% residual stenosis.   Successful Bifurcation stenting of the proximal to mid LCx and first OM using IVUS guidance and [place] technique with DES x 2  Intervention        EKG:  EKG is not ordered today. The ekg ordered 08/04/2019.  Demonstrates sinus rhythm, rate 54, RSR prime V1 and V2, first-degree AV block, left axis deviation.   Recent Labs: 07/19/2021: B Natriuretic Peptide 802.0 07/21/2021: ALT 11; Magnesium 2.0 12/17/2021: BUN 29; Creatinine, Ser 1.90; Hemoglobin 11.0; Platelets 201; Potassium 3.8; Sodium 143    Lipid Panel    Component Value Date/Time   CHOL 110 07/21/2021 0348   CHOL 138 10/01/2019 1133   TRIG 120 07/21/2021 0348   HDL 25 (L) 07/21/2021 0348   HDL 37 (L) 10/01/2019 1133   CHOLHDL 4.4 07/21/2021 0348   VLDL 24 07/21/2021 0348   LDLCALC 61 07/21/2021 0348   LDLCALC 71 10/01/2019 1133      Wt Readings from Last 3 Encounters:  12/23/21 206 lb (93.4 kg)  12/17/21 201 lb 9.6 oz (91.4 kg)  08/03/21 201 lb (91.2 kg)      Other studies Reviewed: Additional studies/ records that were reviewed today include: Hospital. Review of the above records demonstrates:  Please see elsewhere in the note.     ASSESSMENT AND PLAN:  CAD: He is having no symptoms.  He cannot afford the Brilinta.  I will switch him to Plavix.  I will most likely discontinue this in August.  HTN: BP is not at target in the office but at home it is in the 130s.  He will keep an eye on this.   HLD: LDL was 61.  No change in therapy.    DM type 2: A1C was up to 8.7.  He is having is followed by his primary provider and he has  since changed his diet and is going to get this repeated.  We had a long discussion about that.  I did send a message to our pharmacist to see if we can get him any patient assistance for his Jardiance.   CKD stage 3b: Cr was 1.9.  It had peaked at 2.23 in the hospital and can be followed going forward.    Current medicines are reviewed at length with the patient today.  The patient does not have concerns regarding medicines.  The following changes have been made:  As above Labs/ tests ordered today include: None  No orders of the defined types were placed in this encounter.     Disposition:   FU with me in 6 months.    Signed, Minus Breeding, MD  12/23/2021 11:56 AM    [place]

## 2021-12-23 ENCOUNTER — Other Ambulatory Visit: Payer: Self-pay

## 2021-12-23 ENCOUNTER — Ambulatory Visit (INDEPENDENT_AMBULATORY_CARE_PROVIDER_SITE_OTHER): Payer: PPO | Admitting: Cardiology

## 2021-12-23 ENCOUNTER — Encounter: Payer: Self-pay | Admitting: Cardiology

## 2021-12-23 VITALS — BP 168/74 | HR 63 | Wt 206.0 lb

## 2021-12-23 DIAGNOSIS — I10 Essential (primary) hypertension: Secondary | ICD-10-CM

## 2021-12-23 DIAGNOSIS — E785 Hyperlipidemia, unspecified: Secondary | ICD-10-CM

## 2021-12-23 DIAGNOSIS — I2510 Atherosclerotic heart disease of native coronary artery without angina pectoris: Secondary | ICD-10-CM

## 2021-12-23 DIAGNOSIS — E1151 Type 2 diabetes mellitus with diabetic peripheral angiopathy without gangrene: Secondary | ICD-10-CM | POA: Diagnosis not present

## 2021-12-23 DIAGNOSIS — N1832 Chronic kidney disease, stage 3b: Secondary | ICD-10-CM | POA: Diagnosis not present

## 2021-12-23 MED ORDER — CLOPIDOGREL BISULFATE 75 MG PO TABS: 75.0000 mg | ORAL_TABLET | Freq: Every day | ORAL | 3 refills | Status: DC

## 2021-12-23 NOTE — Patient Instructions (Signed)
Medication Instructions:  Please discontinue your Brilinta and start Plavix 75 mg a day. Continue all other medications as listed.  *If you need a refill on your cardiac medications before your next appointment, please call your pharmacy*  Follow-Up: At [facility], you and your health needs are our priority.  As part of our continuing mission to provide you with exceptional heart care, we have created designated Provider Care Teams.  These Care Teams include your primary Cardiologist (physician) and Advanced Practice Providers (APPs -  Physician Assistants and Nurse Practitioners) who all work together to provide you with the care you need, when you need it.  We recommend signing up for the patient portal called "MyChart".  Sign up information is provided on this After Visit Summary.  MyChart is used to connect with patients for Virtual Visits (Telemedicine).  Patients are able to view lab/test results, encounter notes, upcoming appointments, etc.  Non-urgent messages can be sent to your provider as well.   To learn more about what you can do with MyChart, go to [email or web address].    Your next appointment:   6 month(s)  The format for your next appointment:   In Person  Provider:   Minus Breeding, MD{  Thank you for choosing [facility]!!

## 2021-12-30 ENCOUNTER — Other Ambulatory Visit: Payer: Self-pay | Admitting: Family Medicine

## 2021-12-30 DIAGNOSIS — E1159 Type 2 diabetes mellitus with other circulatory complications: Secondary | ICD-10-CM

## 2022-01-05 ENCOUNTER — Ambulatory Visit (INDEPENDENT_AMBULATORY_CARE_PROVIDER_SITE_OTHER): Payer: PPO | Admitting: Family Medicine

## 2022-01-05 DIAGNOSIS — E1122 Type 2 diabetes mellitus with diabetic chronic kidney disease: Secondary | ICD-10-CM | POA: Diagnosis not present

## 2022-01-05 DIAGNOSIS — N184 Chronic kidney disease, stage 4 (severe): Secondary | ICD-10-CM | POA: Diagnosis not present

## 2022-01-05 MED ORDER — TRESIBA FLEXTOUCH 100 UNIT/ML ~~LOC~~ SOPN: 50.0000 [IU] | PEN_INJECTOR | Freq: Every day | SUBCUTANEOUS | 1 refills | Status: DC

## 2022-01-05 NOTE — Progress Notes (Signed)
Telephone visit  Subjective: CC: Follow-up blood sugars PCP: Clifford Norlander, DO [id number] [name] is a 66 y.o. male calls for telephone consult today. Patient provides verbal consent for consult held via phone.  Due to COVID-19 pandemic this visit was conducted virtually. This visit type was conducted due to national recommendations for restrictions regarding the COVID-19 Pandemic (e.g. social distancing, sheltering in place) in an effort to limit this patient's exposure and mitigate transmission in our community. All issues noted in this document were discussed and addressed.  A physical exam was not performed with this format.   Location of patient: home Location of provider: [facility] Others present for call: none  1. DM He is injecting 50 units of [place].  His BG was 174 this am. No BGs below 150 yet. He has been not really keeping a strict diet.  He has not advance beyond the 50 units because he was not getting enough insulin from the pharmacy.  No reports of polydipsia, polyuria or visual disturbance.  Compliant with all blood pressure medicines.  He reports blood pressure has been under good control   ROS: Per HPI  No Known Allergies Past Medical History:  Diagnosis Date   Chronic kidney disease    Complication of anesthesia    Hard to wake   Degenerative joint disease (DJD) of lumbar spine    Diabetes mellitus without complication ([place])    Type II   Essential hypertension 02/23/2017   Lumbar stenosis    NSTEMI (non-ST elevated myocardial infarction) ([place]) 06/07/2021    Current Outpatient Medications:    Alpha-Lipoic Acid 600 MG TABS, Take 600 mg by mouth daily. For NEUROPATHY, Disp: 90 tablet, Rfl: 3   amLODipine (NORVASC) 10 MG tablet, Take 1 tablet (10 mg total) by mouth daily., Disp: 90 tablet, Rfl: 1   aspirin 81 MG EC tablet, Take 1 tablet (81 mg total) by mouth daily. Swallow whole., Disp: 90 tablet, Rfl: 3   atorvastatin (LIPITOR) 80 MG tablet, Take 1 tablet  (80 mg total) by mouth daily., Disp: 90 tablet, Rfl: 1   carvedilol (COREG) 12.5 MG tablet, Take 1 tablet (12.5 mg total) by mouth 2 (two) times daily with a meal., Disp: 60 tablet, Rfl: 2   cloNIDine (CATAPRES) 0.3 MG tablet, TAKE (1) TABLET TWICE A DAY., Disp: 60 tablet, Rfl: 2   clopidogrel (PLAVIX) 75 MG tablet, Take 1 tablet (75 mg total) by mouth daily., Disp: 90 tablet, Rfl: 3   empagliflozin (JARDIANCE) 10 MG TABS tablet, Take 1 tablet (10 mg total) by mouth daily before breakfast., Disp: 30 tablet, Rfl: 6   furosemide (LASIX) 40 MG tablet, Take 1 tablet (40 mg total) by mouth daily., Disp: 30 tablet, Rfl: 1   gabapentin (NEURONTIN) 100 MG capsule, TAKE ONE CAPSULE TWICE DAILY, Disp: 180 capsule, Rfl: 0   insulin degludec (TRESIBA FLEXTOUCH) 100 UNIT/ML FlexTouch Pen, Inject 38-50  units daily as directed (increase by 1 unit every other day until sugar is below 150), Disp: 15 mL, Rfl: 1   Insulin Pen Needle (PEN NEEDLES) 31G X 5 MM MISC, Use daily with insulin Dx E11.9, Disp: 100 each, Rfl: 3   isosorbide mononitrate (IMDUR) 60 MG 24 hr tablet, Take 1 tablet (60 mg total) by mouth daily., Disp: 90 tablet, Rfl: 1   losartan (COZAAR) 25 MG tablet, Take by mouth., Disp: , Rfl:    nitroGLYCERIN (NITROSTAT) 0.4 MG SL tablet, Place 1 tablet (0.4 mg total) under the tongue every 5 (five)  minutes as needed for chest pain., Disp: 25 tablet, Rfl: 3  Assessment/ Plan: 66 y.o. male   Type 2 diabetes mellitus with stage 4 chronic kidney disease, without long-term current use of insulin ([place]) - Plan: insulin degludec (TRESIBA FLEXTOUCH) 100 UNIT/ML FlexTouch Pen  Blood sugar remains uncontrolled.  Advance to 52 units tonight, monitor for 2 days and if fasting blood sugar remains above 150 he will go up to 54 units.  In essence he will increase by 2 units every 2 days pending blood sugars above 150.  He voiced good understanding of this plan.  New Rx sent.  We will see him back in May and appointment has  been made  Start time: 11:58am End time: 12:04a  Total time spent on patient care (including telephone call/ virtual visit): 6 minutes  [name], [name] [phone]

## 2022-01-20 ENCOUNTER — Other Ambulatory Visit: Payer: Self-pay | Admitting: Home Health

## 2022-01-21 ENCOUNTER — Other Ambulatory Visit: Payer: PPO

## 2022-01-21 DIAGNOSIS — R808 Other proteinuria: Secondary | ICD-10-CM | POA: Diagnosis not present

## 2022-01-21 DIAGNOSIS — N19 Unspecified kidney failure: Secondary | ICD-10-CM | POA: Diagnosis not present

## 2022-01-21 DIAGNOSIS — E1122 Type 2 diabetes mellitus with diabetic chronic kidney disease: Secondary | ICD-10-CM | POA: Diagnosis not present

## 2022-01-21 DIAGNOSIS — I129 Hypertensive chronic kidney disease with stage 1 through stage 4 chronic kidney disease, or unspecified chronic kidney disease: Secondary | ICD-10-CM | POA: Diagnosis not present

## 2022-01-21 DIAGNOSIS — N189 Chronic kidney disease, unspecified: Secondary | ICD-10-CM | POA: Diagnosis not present

## 2022-01-21 DIAGNOSIS — D631 Anemia in chronic kidney disease: Secondary | ICD-10-CM | POA: Diagnosis not present

## 2022-01-28 DIAGNOSIS — N189 Chronic kidney disease, unspecified: Secondary | ICD-10-CM | POA: Diagnosis not present

## 2022-01-28 DIAGNOSIS — N19 Unspecified kidney failure: Secondary | ICD-10-CM | POA: Diagnosis not present

## 2022-01-28 DIAGNOSIS — E1122 Type 2 diabetes mellitus with diabetic chronic kidney disease: Secondary | ICD-10-CM | POA: Diagnosis not present

## 2022-01-28 DIAGNOSIS — I129 Hypertensive chronic kidney disease with stage 1 through stage 4 chronic kidney disease, or unspecified chronic kidney disease: Secondary | ICD-10-CM | POA: Diagnosis not present

## 2022-01-28 DIAGNOSIS — D631 Anemia in chronic kidney disease: Secondary | ICD-10-CM | POA: Diagnosis not present

## 2022-01-28 DIAGNOSIS — R808 Other proteinuria: Secondary | ICD-10-CM | POA: Diagnosis not present

## 2022-02-13 ENCOUNTER — Telehealth: Payer: PPO | Admitting: Family Medicine

## 2022-02-13 DIAGNOSIS — U071 COVID-19: Secondary | ICD-10-CM

## 2022-02-13 MED ORDER — PROMETHAZINE-DM 6.25-15 MG/5ML PO SYRP: 5.0000 mL | ORAL_SOLUTION | Freq: Four times a day (QID) | ORAL | 0 refills | Status: DC | PRN

## 2022-02-13 MED ORDER — NIRMATRELVIR/RITONAVIR (PAXLOVID)TABLET: 3.0000 | ORAL_TABLET | Freq: Two times a day (BID) | ORAL | 0 refills | Status: AC

## 2022-02-13 NOTE — Progress Notes (Signed)
?Virtual Visit Consent  ? ?[name], you are scheduled for a virtual visit with a [facility] provider today.   ?  ?Just as with appointments in the office, your consent must be obtained to participate.  Your consent will be active for this visit and any virtual visit you may have with one of our providers in the next 365 days.   ?  ?If you have a MyChart account, a copy of this consent can be sent to you electronically.  All virtual visits are billed to your insurance company just like a traditional visit in the office.   ? ?As this is a virtual visit, video technology does not allow for your provider to perform a traditional examination.  This may limit your provider's ability to fully assess your condition.  If your provider identifies any concerns that need to be evaluated in person or the need to arrange testing (such as labs, EKG, etc.), we will make arrangements to do so.   ?  ?Although advances in technology are sophisticated, we cannot ensure that it will always work on either your end or our end.  If the connection with a video visit is poor, the visit may have to be switched to a telephone visit.  With either a video or telephone visit, we are not always able to ensure that we have a secure connection.    ? ?I need to obtain your verbal consent now.   Are you willing to proceed with your visit today?  ?  ?[name] has provided verbal consent on 02/13/2022 for a virtual visit (video or telephone). ?  ?[name], FNP  ? ?Date: 02/13/2022 3:19 PM ? ? ?Virtual Visit via Video Note  ? ?I, [name], connected with  [name]  ([id number], October 10, 1956) on 02/13/22 at  3:00 PM EDT by a video-enabled telemedicine application and verified that I am speaking with the correct person using two identifiers. ? ?Location: ?Patient: Virtual Visit Location Patient: Home ?Provider: Virtual Visit Location Provider: Home Office ?  ?I discussed the limitations of evaluation and management by  telemedicine and the availability of in person appointments. The patient expressed understanding and agreed to proceed.   ? ?History of Present Illness: ?[name] is a 66 y.o. who identifies as a male who was assigned male at birth, and is being seen today for positive covid test with weakness, cough, fever, chills. Denies wheezing and sob. Has a history of CHF, diabetes and CAD. [name] Cough productive at night. Wife had it and he has had sx for 2 days now. . ? ?HPI: HPI  ?Problems:  ?Patient Active Problem List  ? Diagnosis Date Noted  ? Morbid obesity ([place]) 08/19/2021  ? Coronary artery disease involving native coronary artery of native heart with angina pectoris ([place])   ? Congestive heart failure (CHF) ([place]) 07/19/2021  ? Acute combined systolic and diastolic heart failure ([place]) 07/19/2021  ? CAD, multiple vessel 06/25/2021  ? Elevated troponin   ? DM (diabetes mellitus) type II, controlled, with peripheral vascular disorder ([place])   ? Chronic kidney disease (CKD) stage G3b/A2, moderately decreased glomerular filtration rate (GFR) between 30-44 mL/min/1.73 square meter and albuminuria creatinine ratio between 30-299 mg/g ([facility])   ? Unstable angina ([place]) 06/07/2021  ? NSTEMI (non-ST elevated myocardial infarction) ([place]) 06/07/2021  ? Chest pain 04/07/2021  ? Personal history of diabetic foot ulcer 06/18/2020  ? History of amputation of great toe ([place]) 06/18/2020  ? Osteomyelitis  of great toe of right foot ([place])   ? Diabetic ulcer of toe of right foot associated with diabetes mellitus due to underlying condition, with necrosis of bone ([place])   ? Osteomyelitis ([place]) 05/15/2020  ? AKI (acute kidney injury) ([place]) 05/15/2020  ? OSA (obstructive sleep apnea) 05/15/2020  ? Acute kidney failure, unspecified ([place]) 05/15/2020  ? Cellulitis of right foot 05/07/2020  ? Type 2 diabetes mellitus ([place]) 05/07/2020  ? Cellulitis of right lower extremity   ? Diabetic infection of right foot ([place])   ? Cellulitis of great toe,  right   ? Diabetic ulcer of toe of right foot associated with type 2 diabetes mellitus, with fat layer exposed ([place]) 04/15/2020  ? OSA on CPAP 09/01/2018  ? S/P lumbar fusion 07/06/2018  ? DDD (degenerative disc disease), lumbar 02/28/2018  ? Hypertension associated with diabetes ([place]) 02/23/2017  ? Benign prostatic hyperplasia with incomplete bladder emptying 02/23/2017  ? Vitamin D deficiency 02/23/2017  ? Hyperlipidemia associated with type 2 diabetes mellitus ([place]) 02/23/2017  ? Type 2 diabetes mellitus with stage 2 chronic kidney disease, without long-term current use of insulin ([place]) 02/23/2017  ? Hyperlipidemia, unspecified 02/23/2017  ? 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  ?Allergies: No Known Allergies ?Medications:  ?Current Outpatient Medications:  ?  isosorbide mononitrate (IMDUR) 60 MG 24 hr tablet, TAKE 1 TABLET DAILY, Disp: 90 tablet, Rfl: 2 ?  nirmatrelvir/ritonavir EUA (PAXLOVID) 20 x 150 MG & 10 x '100MG'$  TABS, Take 3 tablets by mouth 2 (two) times daily for 5 days. (Take nirmatrelvir 150 mg two tablets twice daily for 5 days and ritonavir 100 mg one tablet twice daily for 5 days), Disp: 30 tablet, Rfl: 0 ?  promethazine-dextromethorphan (PROMETHAZINE-DM) 6.25-15 MG/5ML syrup, Take 5 mLs by mouth 4 (four) times daily as needed for cough., Disp: 118 mL, Rfl: 0 ?  Alpha-Lipoic Acid 600 MG TABS, Take 600 mg by mouth daily. For NEUROPATHY, Disp: 90 tablet, Rfl: 3 ?  amLODipine (NORVASC) 10 MG tablet, Take 1 tablet (10 mg total) by mouth daily., Disp: 90 tablet, Rfl: 1 ?  aspirin 81 MG EC tablet, Take 1 tablet (81 mg total) by mouth daily. Swallow whole., Disp: 90 tablet, Rfl: 3 ?  atorvastatin (LIPITOR) 80 MG tablet, Take 1 tablet (80 mg total) by mouth daily., Disp: 90 tablet, Rfl: 1 ?  carvedilol (COREG) 12.5 MG tablet, Take 1 tablet (12.5 mg total) by mouth 2 (two) times daily with a meal., Disp: 60 tablet, Rfl: 2 ?  cloNIDine (CATAPRES) 0.3 MG tablet, TAKE (1) TABLET TWICE A DAY.,  Disp: 60 tablet, Rfl: 2 ?  clopidogrel (PLAVIX) 75 MG tablet, Take 1 tablet (75 mg total) by mouth daily., Disp: 90 tablet, Rfl: 3 ?  empagliflozin (JARDIANCE) 10 MG TABS tablet, Take 1 tablet (10 mg total) by mouth daily before breakfast., Disp: 30 tablet, Rfl: 6 ?  furosemide (LASIX) 40 MG tablet, Take 1 tablet (40 mg total) by mouth daily., Disp: 30 tablet, Rfl: 1 ?  gabapentin (NEURONTIN) 100 MG capsule, TAKE ONE CAPSULE TWICE DAILY, Disp: 180 capsule, Rfl: 0 ?  insulin degludec (TRESIBA FLEXTOUCH) 100 UNIT/ML FlexTouch Pen, Inject 50-60 Units into the skin daily. (increase by 2 units every other day until sugar is below 150), Disp: 60 mL, Rfl: 1 ?  Insulin Pen Needle (PEN NEEDLES) 31G X 5 MM MISC, Use daily with insulin Dx E11.9, Disp: 100 each, Rfl: 3 ?  losartan (COZAAR) 25 MG tablet, Take by mouth.,  Disp: , Rfl:  ?  nitroGLYCERIN (NITROSTAT) 0.4 MG SL tablet, Place 1 tablet (0.4 mg total) under the tongue every 5 (five) minutes as needed for chest pain., Disp: 25 tablet, Rfl: 3 ? ?Observations/Objective: ?Patient is well-developed, well-nourished in no acute distress.  ?Resting comfortably  at home.  ?Head is normocephalic, atraumatic.  ?No labored breathing.  ?Speech is clear and coherent with logical content.  ?Patient is alert and oriented at baseline.  ? ? ?Assessment and Plan: ?1. COVID-19 ? ?Increase fluids, ibuprofen or tylenol whichever he can tolerate, med use and side effects discussed. Have his son who lives with him call his doctor. His son is not symptomatic but recently had a liver transplant. MVI with extra vit d and zinc.  ? ?Follow Up Instructions: ?I discussed the assessment and treatment plan with the patient. The patient was provided an opportunity to ask questions and all were answered. The patient agreed with the plan and demonstrated an understanding of the instructions.  A copy of instructions were sent to the patient via MyChart unless otherwise noted below.  ? ? ? ?The patient was  advised to call back or seek an in-person evaluation if the symptoms worsen or if the condition fails to improve as anticipated. ? ?Time:  ?I spent 10 minutes with the patient via telehealth technology disc

## 2022-02-13 NOTE — Patient Instructions (Signed)
10 Things You Can Do to Manage Your COVID-19 Symptoms at Home ?If you have possible or confirmed COVID-19 ?Stay home except to get medical care. ?Monitor your symptoms carefully. If your symptoms get worse, call your healthcare provider immediately. ?Get rest and stay hydrated. ?If you have a medical appointment, call the healthcare provider ahead of time and tell them that you have or may have COVID-19. ?For medical emergencies, call 911 and notify the dispatch personnel that you have or may have COVID-19. ?Cover your cough and sneezes with a tissue or use the inside of your elbow. ?Wash your hands often with soap and water for at least 20 seconds or clean your hands with an alcohol-based hand sanitizer that contains at least 60% alcohol. ?As much as possible, stay in a specific room and away from other people in your home. Also, you should use a separate bathroom, if available. If you need to be around other people in or outside of the home, wear a mask. ?Avoid sharing personal items with other people in your household, like dishes, towels, and bedding. ?Clean all surfaces that are touched often, like counters, tabletops, and doorknobs. Use household cleaning sprays or wipes according to the label instructions. ?cdc.gov/coronavirus ?05/30/2020 ?This information is not intended to replace advice given to you by your health care provider. Make sure you discuss any questions you have with your health care provider. ?Document Revised: 07/24/2021 Document Reviewed: 07/24/2021 ?Elsevier Patient Education ? 2022 Elsevier Inc. ? ?

## 2022-02-16 ENCOUNTER — Telehealth: Payer: PPO

## 2022-02-17 ENCOUNTER — Telehealth: Payer: Self-pay | Admitting: *Deleted

## 2022-02-17 NOTE — Telephone Encounter (Signed)
02/17/2022- Patient informed will come to pick up tomorrow. AP  ?

## 2022-02-17 NOTE — Telephone Encounter (Signed)
02/17/2022 ? ?Patient requesting samples of [name] and Vania Rea if available.  ? ?Forwarding to [facility] Clinical Staff for assistance.  ? ?Chong Sicilian, BSN, RN-BC ?Embedded Chronic Care Manager ?[facility] ?Direct Dial: [phone] ? ?

## 2022-02-22 ENCOUNTER — Telehealth: Payer: Self-pay | Admitting: Cardiology

## 2022-02-22 NOTE — Telephone Encounter (Signed)
? ?  Primary Cardiologist: Minus Breeding, MD ? ?Chart reviewed as part of pre-operative protocol coverage. Simple dental extractions are considered low risk procedures per guidelines and generally do not require any specific cardiac clearance. It is also generally accepted that for simple extractions and dental cleanings, there is no need to interrupt blood thinner therapy.  ? ?SBE prophylaxis is not required for the patient. ? ?I will route this recommendation to the requesting party via Epic fax function and remove from pre-op pool. ? ?Please call with questions. ? ?Emmaline Life, NP-C ? ?  ?02/22/2022, 10:03 AM ?[place] ?[address], [address] ?Office [phone] Fax [phone] ? ? ? ?

## 2022-02-22 NOTE — Telephone Encounter (Signed)
? ?  Pre-operative Risk Assessment  ?  ?Patient Name: Kion Huntsberry  ?DOB: Jan 24, 1956 ?MRN: [id number]  ? ? ? ?Request for Surgical Clearance   ? ?Procedure:   Cleaning  ? ?Date of Surgery:  Clearance 02/22/22                              ?   ?Surgeon: Jenny Reichmann  Melchert  ?Surgeon's Group or Practice Name:  [facility]  ?Phone number:  [phone] ?Fax number:  [phone] ?  ?Type of Clearance Requested:   ?Pharmacy - Pt is in the chair  ?  ?Type of Anesthesia:   None  ?  ?Additional requests/questions:  Does this patient need antibiotics? ? ?Signed, ?Shana A Stovall   ?02/22/2022, 9:51 AM   ?

## 2022-03-11 ENCOUNTER — Telehealth: Payer: Self-pay

## 2022-03-11 ENCOUNTER — Ambulatory Visit (INDEPENDENT_AMBULATORY_CARE_PROVIDER_SITE_OTHER): Payer: PPO

## 2022-03-11 VITALS — Wt 200.0 lb

## 2022-03-11 DIAGNOSIS — R5382 Chronic fatigue, unspecified: Secondary | ICD-10-CM

## 2022-03-11 DIAGNOSIS — Z0000 Encounter for general adult medical examination without abnormal findings: Secondary | ICD-10-CM | POA: Diagnosis not present

## 2022-03-11 DIAGNOSIS — Z599 Problem related to housing and economic circumstances, unspecified: Secondary | ICD-10-CM

## 2022-03-11 DIAGNOSIS — N184 Chronic kidney disease, stage 4 (severe): Secondary | ICD-10-CM

## 2022-03-11 DIAGNOSIS — Z79899 Other long term (current) drug therapy: Secondary | ICD-10-CM

## 2022-03-11 NOTE — Patient Instructions (Signed)
Mr. Kau , ?Thank you for taking time to come for your Medicare Wellness Visit. I appreciate your ongoing commitment to your health goals. Please review the following plan we discussed and let me know if I can assist you in the future.  ? ?Screening recommendations/referrals: ?Colonoscopy: Declined ?Recommended yearly ophthalmology/optometry visit for glaucoma screening and checkup ?Recommended yearly dental visit for hygiene and checkup ? ?Vaccinations: declines all vaccines ?Influenza vaccine: recommended every fall ?Pneumococcal vaccine: Pneumovax-23 done 04/14/2017 - recommend Prevnar ?Tdap vaccine: recommended every 10 years ?Shingles vaccine: Recommended. Shingrix is 2 doses 2-6 months apart and over 90% effective     ?Covid-19: Declined ? ?Advanced directives: Please bring a copy of your health care power of attorney and living will to the office to be added to your chart at your convenience.  ? ?Conditions/risks identified: Aim for 30 minutes of exercise or brisk walking, 6-8 glasses of water, and 5 servings of fruits and vegetables each day.  ? ?Next appointment: Follow up in one year for your annual wellness visit.  ? ?Preventive Care 68 Years and Older, Male ? ?Preventive care refers to lifestyle choices and visits with your health care provider that can promote health and wellness. ?What does preventive care include? ?A yearly physical exam. This is also called an annual well check. ?Dental exams once or twice a year. ?Routine eye exams. Ask your health care provider how often you should have your eyes checked. ?Personal lifestyle choices, including: ?Daily care of your teeth and gums. ?Regular physical activity. ?Eating a healthy diet. ?Avoiding tobacco and drug use. ?Limiting alcohol use. ?Practicing safe sex. ?Taking low doses of aspirin every day. ?Taking vitamin and mineral supplements as recommended by your health care provider. ?What happens during an annual well check? ?The services and  screenings done by your health care provider during your annual well check will depend on your age, overall health, lifestyle risk factors, and family history of disease. ?Counseling  ?Your health care provider may ask you questions about your: ?Alcohol use. ?Tobacco use. ?Drug use. ?Emotional well-being. ?Home and relationship well-being. ?Sexual activity. ?Eating habits. ?History of falls. ?Memory and ability to understand (cognition). ?Work and work Statistician. ?Screening  ?You may have the following tests or measurements: ?Height, weight, and BMI. ?Blood pressure. ?Lipid and cholesterol levels. These may be checked every 5 years, or more frequently if you are over 69 years old. ?Skin check. ?Lung cancer screening. You may have this screening every year starting at age 26 if you have a 30-pack-year history of smoking and currently smoke or have quit within the past 15 years. ?Fecal occult blood test (FOBT) of the stool. You may have this test every year starting at age 57. ?Flexible sigmoidoscopy or colonoscopy. You may have a sigmoidoscopy every 5 years or a colonoscopy every 10 years starting at age 33. ?Prostate cancer screening. Recommendations will vary depending on your family history and other risks. ?Hepatitis C blood test. ?Hepatitis B blood test. ?Sexually transmitted disease (STD) testing. ?Diabetes screening. This is done by checking your blood sugar (glucose) after you have not eaten for a while (fasting). You may have this done every 1-3 years. ?Abdominal aortic aneurysm (AAA) screening. You may need this if you are a current or former smoker. ?Osteoporosis. You may be screened starting at age 13 if you are at high risk. ?Talk with your health care provider about your test results, treatment options, and if necessary, the need for more tests. ?Vaccines  ?Your health care  provider may recommend certain vaccines, such as: ?Influenza vaccine. This is recommended every year. ?Tetanus, diphtheria, and  acellular pertussis (Tdap, Td) vaccine. You may need a Td booster every 10 years. ?Zoster vaccine. You may need this after age 60. ?Pneumococcal 13-valent conjugate (PCV13) vaccine. One dose is recommended after age 33. ?Pneumococcal polysaccharide (PPSV23) vaccine. One dose is recommended after age 32. ?Talk to your health care provider about which screenings and vaccines you need and how often you need them. ?This information is not intended to replace advice given to you by your health care provider. Make sure you discuss any questions you have with your health care provider. ?Document Released: 11/28/2015 Document Revised: 07/21/2016 Document Reviewed: 09/02/2015 ?Elsevier Interactive Patient Education ? 2017 [place]. ? ?Fall Prevention in the Home ?Falls can cause injuries. They can happen to people of all ages. There are many things you can do to make your home safe and to help prevent falls. ?What can I do on the outside of my home? ?Regularly fix the edges of walkways and driveways and fix any cracks. ?Remove anything that might make you trip as you walk through a door, such as a raised step or threshold. ?Trim any bushes or trees on the path to your home. ?Use bright outdoor lighting. ?Clear any walking paths of anything that might make someone trip, such as rocks or tools. ?Regularly check to see if handrails are loose or broken. Make sure that both sides of any steps have handrails. ?Any raised decks and porches should have guardrails on the edges. ?Have any leaves, snow, or ice cleared regularly. ?Use sand or salt on walking paths during winter. ?Clean up any spills in your garage right away. This includes oil or grease spills. ?What can I do in the bathroom? ?Use night lights. ?Install grab bars by the toilet and in the tub and shower. Do not use towel bars as grab bars. ?Use non-skid mats or decals in the tub or shower. ?If you need to sit down in the shower, use a plastic, non-slip stool. ?Keep  the floor dry. Clean up any water that spills on the floor as soon as it happens. ?Remove soap buildup in the tub or shower regularly. ?Attach bath mats securely with double-sided non-slip rug tape. ?Do not have throw rugs and other things on the floor that can make you trip. ?What can I do in the bedroom? ?Use night lights. ?Make sure that you have a light by your bed that is easy to reach. ?Do not use any sheets or blankets that are too big for your bed. They should not hang down onto the floor. ?Have a firm chair that has side arms. You can use this for support while you get dressed. ?Do not have throw rugs and other things on the floor that can make you trip. ?What can I do in the kitchen? ?Clean up any spills right away. ?Avoid walking on wet floors. ?Keep items that you use a lot in easy-to-reach places. ?If you need to reach something above you, use a strong step stool that has a grab bar. ?Keep electrical cords out of the way. ?Do not use floor polish or wax that makes floors slippery. If you must use wax, use non-skid floor wax. ?Do not have throw rugs and other things on the floor that can make you trip. ?What can I do with my stairs? ?Do not leave any items on the stairs. ?Make sure that there are handrails on both  sides of the stairs and use them. Fix handrails that are broken or loose. Make sure that handrails are as long as the stairways. ?Check any carpeting to make sure that it is firmly attached to the stairs. Fix any carpet that is loose or worn. ?Avoid having throw rugs at the top or bottom of the stairs. If you do have throw rugs, attach them to the floor with carpet tape. ?Make sure that you have a light switch at the top of the stairs and the bottom of the stairs. If you do not have them, ask someone to add them for you. ?What else can I do to help prevent falls? ?Wear shoes that: ?Do not have high heels. ?Have rubber bottoms. ?Are comfortable and fit you well. ?Are closed at the toe. Do not  wear sandals. ?If you use a stepladder: ?Make sure that it is fully opened. Do not climb a closed stepladder. ?Make sure that both sides of the stepladder are locked into place. ?Ask someone to hold it for you, i

## 2022-03-11 NOTE — Telephone Encounter (Signed)
Labs ordered.  Need to be done BEFORE 10am. Checking testosterone ?

## 2022-03-11 NOTE — Progress Notes (Signed)
? ?Subjective:  ? [name] is a 66 y.o. male who presents for Medicare Annual/Subsequent preventive examination. ? ?Virtual Visit via Telephone Note ? ?I connected with  [name] on 03/11/22 at 10:30 AM EDT by telephone and verified that I am speaking with the correct person using two identifiers. ? ?Location: ?Patient: Home ?Provider: [facility] ?Persons participating in the virtual visit: patient/Nurse Health Advisor ?  ?I discussed the limitations, risks, security and privacy concerns of performing an evaluation and management service by telephone and the availability of in person appointments. The patient expressed understanding and agreed to proceed. ? ?Interactive audio and video telecommunications were attempted between this nurse and patient, however failed, due to patient having technical difficulties OR patient did not have access to video capability.  We continued and completed visit with audio only. ? ?Some vital signs may be absent or patient reported.  ? ?Clifford Slaydon Dionne Ano, LPN  ? ?Review of Systems    ? ?Cardiac Risk Factors include: advanced age (>18mn, >>69women);diabetes mellitus;dyslipidemia;hypertension;male gender;Other (see comment), Risk factor comments: OSA on CPAP, CHF, CAD, hx of MI ? ?   ?Objective:  ?  ?Today's Vitals  ? 03/11/22 1031  ?Weight: 200 lb (90.7 kg)  ? ?Body mass index is 27.12 kg/m?. ? ? ?  03/11/2022  ? 10:44 AM 07/19/2021  ?  8:11 PM 07/19/2021  ? 10:56 AM 06/07/2021  ?  9:00 PM 03/10/2021  ? 10:37 AM 05/15/2020  ?  6:53 PM 05/15/2020  ?  3:20 PM  ?Advanced Directives  ?Does Patient Have a Medical Advance Directive? No No No No No No No  ?Would patient like information on creating a medical advance directive? No - Patient declined No - Patient declined  No - Patient declined Yes (MAU/Ambulatory/Procedural Areas - Information given) No - Patient declined No - Patient declined  ? ? ?Current Medications (verified) ?Outpatient Encounter Medications as of 03/11/2022   ?Medication Sig  ? amLODipine (NORVASC) 10 MG tablet Take 1 tablet (10 mg total) by mouth daily.  ? aspirin 81 MG EC tablet Take 1 tablet (81 mg total) by mouth daily. Swallow whole.  ? atorvastatin (LIPITOR) 80 MG tablet Take 1 tablet (80 mg total) by mouth daily.  ? carvedilol (COREG) 12.5 MG tablet Take 1 tablet (12.5 mg total) by mouth 2 (two) times daily with a meal.  ? cloNIDine (CATAPRES) 0.3 MG tablet TAKE (1) TABLET TWICE A DAY.  ? clopidogrel (PLAVIX) 75 MG tablet Take 1 tablet (75 mg total) by mouth daily.  ? empagliflozin (JARDIANCE) 10 MG TABS tablet Take 1 tablet (10 mg total) by mouth daily before breakfast.  ? furosemide (LASIX) 40 MG tablet Take 1 tablet (40 mg total) by mouth daily.  ? gabapentin (NEURONTIN) 100 MG capsule TAKE ONE CAPSULE TWICE DAILY  ? insulin degludec (TRESIBA FLEXTOUCH) 100 UNIT/ML FlexTouch Pen Inject 50-60 Units into the skin daily. (increase by 2 units every other day until sugar is below 150)  ? Insulin Pen Needle (PEN NEEDLES) 31G X 5 MM MISC Use daily with insulin Dx E11.9  ? isosorbide mononitrate (IMDUR) 60 MG 24 hr tablet TAKE 1 TABLET DAILY  ? losartan (COZAAR) 25 MG tablet Take by mouth.  ? [DISCONTINUED] Finerenone 10 MG TABS Take by mouth.  ? nitroGLYCERIN (NITROSTAT) 0.4 MG SL tablet Place 1 tablet (0.4 mg total) under the tongue every 5 (five) minutes as needed for chest pain.  ? [DISCONTINUED] Alpha-Lipoic Acid 600 MG TABS Take 600  mg by mouth daily. For NEUROPATHY (Patient not taking: Reported on 03/11/2022)  ? [DISCONTINUED] promethazine-dextromethorphan (PROMETHAZINE-DM) 6.25-15 MG/5ML syrup Take 5 mLs by mouth 4 (four) times daily as needed for cough. (Patient not taking: Reported on 03/11/2022)  ? ?No facility-administered encounter medications on file as of 03/11/2022.  ? ? ?Allergies (verified) ?Patient has no known allergies.  ? ?History: ?Past Medical History:  ?Diagnosis Date  ? Chronic kidney disease   ? Complication of anesthesia   ? Hard to wake  ?  Degenerative joint disease (DJD) of lumbar spine   ? Diabetes mellitus without complication ([place])   ? Type II  ? Essential hypertension 02/23/2017  ? Lumbar stenosis   ? NSTEMI (non-ST elevated myocardial infarction) ([place]) 06/07/2021  ? ?Past Surgical History:  ?Procedure Laterality Date  ? AMPUTATION TOE Right 05/17/2020  ? Procedure: AMPUTATION TOE partial right great toe;  Surgeon: Evelina Bucy, DPM;  Location: [facility];  Service: Podiatry;  Laterality: Right;  ? Major    ? CARDIAC CATHETERIZATION    ? CORONARY STENT INTERVENTION N/A 06/11/2021  ? Procedure: CORONARY STENT INTERVENTION;  Surgeon: Martinique, Peter M, MD;  Location: [facility];  Service: Cardiovascular;  Laterality: N/A;  ? EYE SURGERY    ? HEMORRHOID SURGERY    ? INTRAVASCULAR ULTRASOUND/IVUS N/A 06/11/2021  ? Procedure: Intravascular Ultrasound/IVUS;  Surgeon: Martinique, Peter M, MD;  Location: [facility];  Service: Cardiovascular;  Laterality: N/A;  ? LEFT HEART CATH AND CORONARY ANGIOGRAPHY N/A 06/09/2021  ? Procedure: LEFT HEART CATH AND CORONARY ANGIOGRAPHY;  Surgeon: Martinique, Peter M, MD;  Location: [facility];  Service: Cardiovascular;  Laterality: N/A;  ? LEFT HEART CATH AND CORONARY ANGIOGRAPHY N/A 07/23/2021  ? Procedure: LEFT HEART CATH AND CORONARY ANGIOGRAPHY;  Surgeon: Lorretta Harp, MD;  Location: [facility];  Service: Cardiovascular;  Laterality: N/A;  ? skin cancer removed right ear    ? TRANSFORAMINAL LUMBAR INTERBODY FUSION (TLIF) WITH PEDICLE SCREW FIXATION 2 LEVEL N/A 07/06/2018  ? Procedure: TRANSFORAMINAL LUMBAR INTERBODY FUSION (TLIF) L4-S1;  Surgeon: Melina Schools, MD;  Location: [facility];  Service: Orthopedics;  Laterality: N/A;  ? ?Family History  ?Problem Relation Age of Onset  ? Heart attack Father 76  ?     Died suddenly  ? Hypertension Father   ? Vision loss Sister   ?     one eye  ? Cancer Brother   ?     skin  ? Diabetes Brother   ? ?Social History  ? ?Socioeconomic History  ?  Marital status: Married  ?  Spouse name: Not on file  ? Number of children: 3  ? Years of education: Not on file  ? Highest education level: Not on file  ?Occupational History  ? Occupation: retired  ?  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 Smoking status: Never  ? Smokeless tobacco: Never  ?Vaping Use  ? Vaping Use: Never used  ?Substance and Sexual Activity  ? Alcohol use: Yes  ?  Alcohol/week: 6.0 standard drinks  ?  Types: 6 Cans of beer per week  ? Drug use: No  ? Sexual activity: Yes  ?Other Topics Concern  ? Not on file  ?Social History Narrative  ? Lives with his wife - Has a son with cirrhosis who he stays with a lot. Has another son and one daughter   ? ?Social Determinants of Health  ? ?  Financial Resource Strain: Low Risk   ? Difficulty of Paying Living Expenses: Not hard at all  ?Food Insecurity: No Food Insecurity  ? Worried About Charity fundraiser in the Last Year: Never true  ? Ran Out of Food in the Last Year: Never true  ?Transportation Needs: No Transportation Needs  ? Lack of Transportation (Medical): No  ? Lack of Transportation (Non-Medical): No  ?Physical Activity: Sufficiently Active  ? Days of Exercise per Week: 7 days  ? Minutes of Exercise per Session: 40 min  ?Stress: No Stress Concern Present  ? Feeling of Stress : Not at all  ?Social Connections: Moderately Isolated  ? Frequency of Communication with Friends and Family: More than three times a week  ? Frequency of Social Gatherings with Friends and Family: More than three times a week  ? Attends Religious Services: Never  ? Active Member of Clubs or Organizations: No  ? Attends Archivist Meetings: Never  ? Marital Status: Married  ? ? ?Tobacco Counseling ?Counseling given: Not Answered ? ? ?Clinical Intake: ? ?Pre-visit preparation completed: Yes ? ?Pain : No/denies pain ? ?  ? ?BMI - recorded: 27.12 ?Nutritional Status: BMI 25 -29 Overweight ?Nutritional Risks: None ?Diabetes: Yes ?CBG done?: No ?Did pt.  bring in CBG monitor from home?: No ? ?How often do you need to have someone help you when you read instructions, pamphlets, or other written materials from your doctor or pharmacy?: 1 - Never ? ?Diabetic? Nutrition

## 2022-03-11 NOTE — Telephone Encounter (Signed)
PT AWARE  

## 2022-03-11 NOTE — Addendum Note (Signed)
Addended by: [name] on: 03/11/2022 01:00 PM ? ? Modules accepted: Orders ? ?

## 2022-03-11 NOTE — Telephone Encounter (Signed)
He has f/u appt with Dr Lajuana Ripple Katherine Basset, 5/2. He would like labs drawn tomorrow or Monday to check anything that could be causing his fatigue to discuss at appt. ?

## 2022-03-11 NOTE — Chronic Care Management (AMB) (Signed)
?  Chronic Care Management  ? ?Note ? ?03/11/2022 ?Name: [name] MRN: [id number] DOB: [birth year] ? ?[name] is a 66 y.o. year old male who is a primary care patient of [name], DO. [name] is currently enrolled in care management services. An additional referral for Pharm D  was placed.  ? ?Follow up plan: ?Telephone appointment with care management team member scheduled for:04/07/2022 ? ?Noreene Larsson, RMA ?Care Guide, Embedded Care Coordination ?[facility]  ?[employer],  [id number] ?Direct Dial: [phone] ?[email or web address] ?Website: [email or web address]  ? ?

## 2022-03-12 ENCOUNTER — Other Ambulatory Visit: Payer: PPO

## 2022-03-12 DIAGNOSIS — R5382 Chronic fatigue, unspecified: Secondary | ICD-10-CM | POA: Diagnosis not present

## 2022-03-12 DIAGNOSIS — N184 Chronic kidney disease, stage 4 (severe): Secondary | ICD-10-CM | POA: Diagnosis not present

## 2022-03-12 DIAGNOSIS — E1122 Type 2 diabetes mellitus with diabetic chronic kidney disease: Secondary | ICD-10-CM

## 2022-03-12 LAB — BAYER DCA HB A1C WAIVED: HB A1C (BAYER DCA - WAIVED): 7.9 % — ABNORMAL HIGH (ref 4.8–5.6)

## 2022-03-13 LAB — CBC WITH DIFFERENTIAL/PLATELET
Basophils Absolute: 0.1 10*3/uL (ref 0.0–0.2)
Basos: 1 %
EOS (ABSOLUTE): 0.2 10*3/uL (ref 0.0–0.4)
Eos: 3 %
Hematocrit: 31.7 % — ABNORMAL LOW (ref 37.5–51.0)
Hemoglobin: 10.4 g/dL — ABNORMAL LOW (ref 13.0–17.7)
Immature Grans (Abs): 0 10*3/uL (ref 0.0–0.1)
Immature Granulocytes: 0 %
Lymphocytes Absolute: 2.4 10*3/uL (ref 0.7–3.1)
Lymphs: 32 %
MCH: 27.6 pg (ref 26.6–33.0)
MCHC: 32.8 g/dL (ref 31.5–35.7)
MCV: 84 fL (ref 79–97)
Monocytes Absolute: 0.5 10*3/uL (ref 0.1–0.9)
Monocytes: 6 %
Neutrophils Absolute: 4.3 10*3/uL (ref 1.4–7.0)
Neutrophils: 58 %
Platelets: 151 10*3/uL (ref 150–450)
RBC: 3.77 x10E6/uL — ABNORMAL LOW (ref 4.14–5.80)
RDW: 13.8 % (ref 11.6–15.4)
WBC: 7.4 10*3/uL (ref 3.4–10.8)

## 2022-03-13 LAB — VITAMIN B12: Vitamin B-12: 1187 pg/mL (ref 232–1245)

## 2022-03-13 LAB — CMP14+EGFR
ALT: 15 IU/L (ref 0–44)
AST: 16 IU/L (ref 0–40)
Albumin/Globulin Ratio: 2.1 (ref 1.2–2.2)
Albumin: 4 g/dL (ref 3.8–4.8)
Alkaline Phosphatase: 98 IU/L (ref 44–121)
BUN/Creatinine Ratio: 16 (ref 10–24)
BUN: 29 mg/dL — ABNORMAL HIGH (ref 8–27)
Bilirubin Total: 0.5 mg/dL (ref 0.0–1.2)
CO2: 22 mmol/L (ref 20–29)
Calcium: 9 mg/dL (ref 8.6–10.2)
Chloride: 107 mmol/L — ABNORMAL HIGH (ref 96–106)
Creatinine, Ser: 1.87 mg/dL — ABNORMAL HIGH (ref 0.76–1.27)
Globulin, Total: 1.9 g/dL (ref 1.5–4.5)
Glucose: 188 mg/dL — ABNORMAL HIGH (ref 70–99)
Potassium: 3.4 mmol/L — ABNORMAL LOW (ref 3.5–5.2)
Sodium: 143 mmol/L (ref 134–144)
Total Protein: 5.9 g/dL — ABNORMAL LOW (ref 6.0–8.5)
eGFR: 39 mL/min/{1.73_m2} — ABNORMAL LOW (ref >59)

## 2022-03-13 LAB — TESTOSTERONE: Testosterone: 446 ng/dL (ref 264–916)

## 2022-03-13 LAB — T4, FREE: Free T4: 1.42 ng/dL (ref 0.82–1.77)

## 2022-03-13 LAB — TSH: TSH: 3.27 u[IU]/mL (ref 0.450–4.500)

## 2022-03-16 ENCOUNTER — Ambulatory Visit (INDEPENDENT_AMBULATORY_CARE_PROVIDER_SITE_OTHER): Payer: PPO | Admitting: Family Medicine

## 2022-03-16 ENCOUNTER — Encounter: Payer: Self-pay | Admitting: Family Medicine

## 2022-03-16 VITALS — BP 149/70 | HR 64 | Temp 97.3°F | Ht 72.0 in | Wt 203.2 lb

## 2022-03-16 DIAGNOSIS — R5382 Chronic fatigue, unspecified: Secondary | ICD-10-CM | POA: Diagnosis not present

## 2022-03-16 DIAGNOSIS — E1122 Type 2 diabetes mellitus with diabetic chronic kidney disease: Secondary | ICD-10-CM

## 2022-03-16 DIAGNOSIS — N184 Chronic kidney disease, stage 4 (severe): Secondary | ICD-10-CM

## 2022-03-16 NOTE — Progress Notes (Signed)
? ?Subjective: ?CC: Follow-up chronic fatigue ?PCP: Clifford Norlander, DO ?OMV:[facility]NOB [name] is a 67 y.o. male presenting to clinic today for: ? ?1.  Chronic fatigue ?Patient has ongoing chronic fatigue.  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  We recently did metabolic testing which demonstrated really no big changes except for some improvement in renal function and blood sugar levels. ? ? ?ROS: Per HPI ? ?No Known Allergies ?Past Medical History:  ?Diagnosis Date  ? Chronic kidney disease   ? Complication of anesthesia   ? Hard to wake  ? Degenerative joint disease (DJD) of lumbar spine   ? Diabetes mellitus without complication ([place])   ? Type II  ? Essential hypertension 02/23/2017  ? Lumbar stenosis   ? NSTEMI (non-ST elevated myocardial infarction) ([place]) 06/07/2021  ? ? ?Current Outpatient Medications:  ?  amLODipine (NORVASC) 10 MG tablet, Take 1 tablet (10 mg total) by mouth daily., Disp: 90 tablet, Rfl: 1 ?  aspirin 81 MG EC tablet, Take 1 tablet (81 mg total) by mouth daily. Swallow whole., Disp: 90 tablet, Rfl: 3 ?  atorvastatin (LIPITOR) 80 MG tablet, Take 1 tablet (80 mg total) by mouth daily., Disp: 90 tablet, Rfl: 1 ?  carvedilol (COREG) 12.5 MG tablet, Take 1 tablet (12.5 mg total) by mouth 2 (two) times daily with a meal., Disp: 60 tablet, Rfl: 2 ?  cloNIDine (CATAPRES) 0.3 MG tablet, TAKE (1) TABLET TWICE A DAY., Disp: 60 tablet, Rfl: 2 ?  clopidogrel (PLAVIX) 75 MG tablet, Take 1 tablet (75 mg total) by mouth daily., Disp: 90 tablet, Rfl: 3 ?  empagliflozin (JARDIANCE) 10 MG TABS tablet, Take 1 tablet (10 mg total) by mouth daily before breakfast., Disp: 30 tablet, Rfl: 6 ?  furosemide (LASIX) 40 MG tablet, Take 1 tablet (40 mg total) by mouth daily., Disp: 30 tablet, Rfl: 1 ?  gabapentin (NEURONTIN) 100 MG capsule, TAKE ONE CAPSULE TWICE DAILY, Disp:  180 capsule, Rfl: 0 ?  insulin degludec (TRESIBA FLEXTOUCH) 100 UNIT/ML FlexTouch Pen, Inject 50-60 Units into the skin daily. (increase by 2 units every other day until sugar is below 150), Disp: 60 mL, Rfl: 1 ?  Insulin Pen Needle (PEN NEEDLES) 31G X 5 MM MISC, Use daily with insulin Dx E11.9, Disp: 100 each, Rfl: 3 ?  isosorbide mononitrate (IMDUR) 60 MG 24 hr tablet, TAKE 1 TABLET DAILY, Disp: 90 tablet, Rfl: 2 ?  losartan (COZAAR) 25 MG tablet, Take by mouth., Disp: , Rfl:  ?  nitroGLYCERIN (NITROSTAT) 0.4 MG SL tablet, Place 1 tablet (0.4 mg total) under the tongue every 5 (five) minutes as needed for chest pain. (Patient not taking: Reported on 03/16/2022), Disp: 25 tablet, Rfl: 3 ?Social History  ? ?Socioeconomic History  ? Marital status: Married  ?  Spouse name: Not on file  ? Number of children: 3  ? Years of education: Not on file  ? Highest education level: Not on file  ?Occupational History  ? Occupation: retired  ?  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 Smoking status: Never  ? Smokeless tobacco: Never  ?Vaping Use  ? Vaping Use: Never used  ?Substance and Sexual Activity  ? Alcohol use: Yes  ?  Alcohol/week: 6.0 standard drinks  ?  Types: 6 Cans of beer per week  ?  Drug use: No  ? Sexual activity: Yes  ?Other Topics Concern  ? Not on file  ?Social History Narrative  ? Lives with his wife - Has a son with cirrhosis who he stays with a lot. Has another son and one daughter   ? ?Social Determinants of Health  ? ?Financial Resource Strain: Low Risk   ? Difficulty of Paying Living Expenses: Not hard at all  ?Food Insecurity: No Food Insecurity  ? Worried About Charity fundraiser in the Last Year: Never true  ? Ran Out of Food in the Last Year: Never true  ?Transportation Needs: No Transportation Needs  ? Lack of Transportation (Medical): No  ? Lack of Transportation (Non-Medical): No  ?Physical Activity: Sufficiently Active  ? Days of Exercise per Week: 7 days  ? Minutes of Exercise per  Session: 40 min  ?Stress: No Stress Concern Present  ? Feeling of Stress : Not at all  ?Social Connections: Moderately Isolated  ? Frequency of Communication with Friends and Family: More than three times a week  ? Frequency of Social Gatherings with Friends and Family: More than three times a week  ? Attends Religious Services: Never  ? Active Member of Clubs or Organizations: No  ? Attends Archivist Meetings: Never  ? Marital Status: Married  ?Intimate Partner Violence: Not At Risk  ? Fear of Current or Ex-Partner: No  ? Emotionally Abused: No  ? Physically Abused: No  ? Sexually Abused: No  ? ?Family History  ?Problem Relation Age of Onset  ? Heart attack Father 8  ?     Died suddenly  ? Hypertension Father   ? Vision loss Sister   ?     one eye  ? Cancer Brother   ?     skin  ? Diabetes Brother   ? ? ?Objective: ?Office vital signs reviewed. ?BP (!) 157/73   Pulse 64   Temp (!) 97.3 ?F (36.3 ?C)   Ht 6' (1.829 m)   Wt 203 lb 3.2 oz (92.2 kg)   SpO2 99%   BMI 27.56 kg/m?  ? ?Physical Examination:  ?General: Awake, alert, toxic appearing elderly male, No acute distress ?HEENT: sclera white, MMM ?Cardio: regular rate and rhythm, S1S2 heard, no murmurs appreciated ?Pulm: clear to auscultation bilaterally, no wheezes, rhonchi or rales; normal work of breathing on room air ?Psych: Mood stable, speech normal, very pleasant and interactive ? ? ?  03/16/2022  ?  1:17 PM 03/11/2022  ? 10:40 AM 12/17/2021  ?  1:39 PM  ?Depression screen PHQ 2/9  ?Decreased Interest 0 0 0  ?Down, Depressed, Hopeless 0 0 0  ?PHQ - 2 Score 0 0 0  ? ? ?  03/16/2022  ?  1:17 PM 12/17/2021  ?  1:39 PM 07/03/2021  ?  9:39 AM 06/25/2021  ?  3:35 PM  ?GAD 7 : Generalized Anxiety Score  ?Nervous, Anxious, on Edge 0 0 0 0  ?Control/stop worrying 0 0 0 0  ?Worry too much - different things 0 0 0 0  ?Trouble relaxing 0 0 0 0  ?Restless 0 0 0 0  ?Easily annoyed or irritable 0 0 0 0  ?Afraid - awful might happen 0 0 0 0  ?Total GAD 7 Score 0 0 0 0   ?Anxiety Difficulty Not difficult at all Not difficult at all Not difficult at all Not difficult at all  ? ?Recent Results (from the past 2160 hour(s))  ?[employer]  DCA Hb A1c Waived     Status: Abnormal  ? Collection Time: 03/12/22  9:50 AM  ?Result Value Ref Range  ? HB A1C (BAYER DCA - WAIVED) 7.9 (H) 4.8 - 5.6 %  ?  Comment:          Prediabetes: 5.7 - 6.4 ?         Diabetes: >6.4 ?         Glycemic control for adults with diabetes: <7.0 ?  ?Testosterone     Status: None  ? Collection Time: 03/12/22  9:52 AM  ?Result Value Ref Range  ? Testosterone 446 264 - 916 ng/dL  ?  Comment: Adult male reference interval is based on a population of ?healthy nonobese males (BMI <30) between 61 and 63 years old. ?[place], [place] 2017,102;1161-1173. PMID: 37482707. ?  ?Vitamin B12     Status: None  ? Collection Time: 03/12/22  9:52 AM  ?Result Value Ref Range  ? Vitamin B-12 1,187 232 - 1,245 pg/mL  ?CBC with Differential     Status: Abnormal  ? Collection Time: 03/12/22  9:52 AM  ?Result Value Ref Range  ? WBC 7.4 3.4 - 10.8 x10E3/uL  ? RBC 3.77 (L) 4.14 - 5.80 x10E6/uL  ? Hemoglobin 10.4 (L) 13.0 - 17.7 g/dL  ? Hematocrit 31.7 (L) 37.5 - 51.0 %  ? MCV 84 79 - 97 fL  ? MCH 27.6 26.6 - 33.0 pg  ? MCHC 32.8 31.5 - 35.7 g/dL  ? RDW 13.8 11.6 - 15.4 %  ? Platelets 151 150 - 450 x10E3/uL  ? Neutrophils 58 Not Estab. %  ? Lymphs 32 Not Estab. %  ? Monocytes 6 Not Estab. %  ? Eos 3 Not Estab. %  ? Basos 1 Not Estab. %  ? Neutrophils Absolute 4.3 1.4 - 7.0 x10E3/uL  ? Lymphocytes Absolute 2.4 0.7 - 3.1 x10E3/uL  ? Monocytes Absolute 0.5 0.1 - 0.9 x10E3/uL  ? EOS (ABSOLUTE) 0.2 0.0 - 0.4 x10E3/uL  ? Basophils Absolute 0.1 0.0 - 0.2 x10E3/uL  ? Immature Granulocytes 0 Not Estab. %  ? Immature Grans (Abs) 0.0 0.0 - 0.1 x10E3/uL  ?T4, free     Status: None  ? Collection Time: 03/12/22  9:52 AM  ?Result Value Ref Range  ? Free T4 1.42 0.82 - 1.77 ng/dL  ?TSH     Status: None  ? Collection Time: 03/12/22  9:52 AM  ?Result Value Ref  Range  ? TSH 3.270 0.450 - 4.500 uIU/mL  ?CMP14+EGFR     Status: Abnormal  ? Collection Time: 03/12/22  9:52 AM  ?Result Value Ref Range  ? Glucose 188 (H) 70 - 99 mg/dL  ? BUN 29 (H) 8 - 27 mg/dL  ? Creatinine,

## 2022-03-16 NOTE — Patient Instructions (Signed)
I have given you the concentrated version of the tresiba.  Let me know if you like this better and I will send this in instead. ?

## 2022-03-26 DIAGNOSIS — H40033 Anatomical narrow angle, bilateral: Secondary | ICD-10-CM | POA: Diagnosis not present

## 2022-03-26 DIAGNOSIS — E119 Type 2 diabetes mellitus without complications: Secondary | ICD-10-CM | POA: Diagnosis not present

## 2022-04-01 ENCOUNTER — Other Ambulatory Visit: Payer: Self-pay | Admitting: Family Medicine

## 2022-04-01 DIAGNOSIS — I152 Hypertension secondary to endocrine disorders: Secondary | ICD-10-CM

## 2022-04-07 ENCOUNTER — Ambulatory Visit (INDEPENDENT_AMBULATORY_CARE_PROVIDER_SITE_OTHER): Payer: PPO | Admitting: Pharmacist

## 2022-04-07 DIAGNOSIS — N1832 Chronic kidney disease, stage 3b: Secondary | ICD-10-CM

## 2022-04-07 DIAGNOSIS — E1151 Type 2 diabetes mellitus with diabetic peripheral angiopathy without gangrene: Secondary | ICD-10-CM

## 2022-04-14 ENCOUNTER — Other Ambulatory Visit: Payer: PPO

## 2022-04-14 DIAGNOSIS — E1122 Type 2 diabetes mellitus with diabetic chronic kidney disease: Secondary | ICD-10-CM | POA: Diagnosis not present

## 2022-04-14 DIAGNOSIS — N19 Unspecified kidney failure: Secondary | ICD-10-CM | POA: Diagnosis not present

## 2022-04-14 DIAGNOSIS — N1832 Chronic kidney disease, stage 3b: Secondary | ICD-10-CM

## 2022-04-14 DIAGNOSIS — E1151 Type 2 diabetes mellitus with diabetic peripheral angiopathy without gangrene: Secondary | ICD-10-CM

## 2022-04-14 DIAGNOSIS — N189 Chronic kidney disease, unspecified: Secondary | ICD-10-CM | POA: Diagnosis not present

## 2022-04-14 DIAGNOSIS — E119 Type 2 diabetes mellitus without complications: Secondary | ICD-10-CM

## 2022-04-14 DIAGNOSIS — I129 Hypertensive chronic kidney disease with stage 1 through stage 4 chronic kidney disease, or unspecified chronic kidney disease: Secondary | ICD-10-CM | POA: Diagnosis not present

## 2022-04-14 DIAGNOSIS — R808 Other proteinuria: Secondary | ICD-10-CM | POA: Diagnosis not present

## 2022-04-14 DIAGNOSIS — D631 Anemia in chronic kidney disease: Secondary | ICD-10-CM | POA: Diagnosis not present

## 2022-04-15 ENCOUNTER — Telehealth: Payer: Self-pay

## 2022-04-15 NOTE — Telephone Encounter (Signed)
Received notification from [employer] regarding approval for [facility]. Patient assistance approved from 04/14/22 to 11/14/22.  Phone: [phone]

## 2022-04-20 DIAGNOSIS — N183 Chronic kidney disease, stage 3 unspecified: Secondary | ICD-10-CM | POA: Diagnosis not present

## 2022-04-20 DIAGNOSIS — Z7902 Long term (current) use of antithrombotics/antiplatelets: Secondary | ICD-10-CM | POA: Diagnosis not present

## 2022-04-20 DIAGNOSIS — E1142 Type 2 diabetes mellitus with diabetic polyneuropathy: Secondary | ICD-10-CM | POA: Diagnosis not present

## 2022-04-20 DIAGNOSIS — I25118 Atherosclerotic heart disease of native coronary artery with other forms of angina pectoris: Secondary | ICD-10-CM | POA: Diagnosis not present

## 2022-04-20 DIAGNOSIS — E1122 Type 2 diabetes mellitus with diabetic chronic kidney disease: Secondary | ICD-10-CM | POA: Diagnosis not present

## 2022-04-20 DIAGNOSIS — I5040 Unspecified combined systolic (congestive) and diastolic (congestive) heart failure: Secondary | ICD-10-CM | POA: Diagnosis not present

## 2022-04-20 DIAGNOSIS — Z6827 Body mass index (BMI) 27.0-27.9, adult: Secondary | ICD-10-CM | POA: Diagnosis not present

## 2022-04-20 DIAGNOSIS — Z794 Long term (current) use of insulin: Secondary | ICD-10-CM | POA: Diagnosis not present

## 2022-04-20 DIAGNOSIS — I252 Old myocardial infarction: Secondary | ICD-10-CM | POA: Diagnosis not present

## 2022-04-20 DIAGNOSIS — D692 Other nonthrombocytopenic purpura: Secondary | ICD-10-CM | POA: Diagnosis not present

## 2022-04-20 DIAGNOSIS — I130 Hypertensive heart and chronic kidney disease with heart failure and stage 1 through stage 4 chronic kidney disease, or unspecified chronic kidney disease: Secondary | ICD-10-CM | POA: Diagnosis not present

## 2022-04-20 DIAGNOSIS — Z7984 Long term (current) use of oral hypoglycemic drugs: Secondary | ICD-10-CM | POA: Diagnosis not present

## 2022-04-20 MED ORDER — DAPAGLIFLOZIN PROPANEDIOL 10 MG PO TABS: 10.0000 mg | ORAL_TABLET | Freq: Every day | ORAL | 5 refills | Status: DC

## 2022-04-20 NOTE — Patient Instructions (Addendum)
Visit Information  Following are the goals we discussed today:  Current Barriers:  Unable to independently afford treatment regimen Unable to achieve control of T2DM ,CKD    Pharmacist Clinical Goal(s):  patient will verbalize ability to afford treatment regimen achieve control of T2DM, CKD as evidenced by GOAL A1C, IMPROVED GFR through collaboration with PharmD and provider.   Interventions: 1:1 collaboration with [name], DO regarding development and update of comprehensive plan of care as evidenced by provider attestation and co-signature Inter-disciplinary care team collaboration (see longitudinal plan of care) Comprehensive medication review performed; medication list updated in electronic medical record  Diabetes: New goal. Uncontrolled-A1C 7.9%, GFR 39; current treatment:tresiba 56 units daily, jardiance '10mg'$  daily;  Due to ease of patient assistance and better evidence in CKD/CV, will transition jardiance to -->farxiga '10mg'$  daily Application submitted to az&me patient assistance Escribed farxiga '10mg'$  to [facility] Denies hypoglycemic/hyperglycemic symptoms Discussed meal planning options and Plate method for healthy eating Avoid sugary drinks and desserts Incorporate balanced protein, non starchy veggies, 1 serving of carbohydrate with each meal Increase water intake Increase physical activity as able Current exercise: n/a Assessed patient finances. Submitted application to az&me PAP for farxiga   Patient Goals/Self-Care Activities patient will:  - take medications as prescribed as evidenced by patient report and record review check glucose daily or if symptomatic, document, and provide at future appointments collaborate with provider on medication access solutions target a minimum of 150 minutes of moderate intensity exercise weekly engage in dietary modifications by FOLLOWING A HEART HEALTHY DIET/HEALTHY PLATE METHOD    Plan: Telephone follow  up appointment with care management team member scheduled for:  3 months  Signature Regina Eck, PharmD, BCPS Clinical Pharmacist, [place]  II Phone [phone]   Please call the care guide team at [phone] if you need to cancel or reschedule your appointment.   Patient verbalizes understanding of instructions and care plan provided today and agrees to view in [place]. Active MyChart status and patient understanding of how to access instructions and care plan via MyChart confirmed with patient.

## 2022-04-20 NOTE — Progress Notes (Signed)
Chronic Care Management Pharmacy Note  04/07/2022 Name:  [name] MRN:  [id number] DOB:  [birth year]  Summary:  Diabetes: New goal. Uncontrolled-A1C 7.9%, GFR 39; current treatment:tresiba 56 units daily, jardiance $RemoveBeforeDE'10mg'gCAogSFePlyjair$  daily;  Due to ease of patient assistance and better evidence in CKD/CV, will transition jardiance to -->farxiga $RemoveBefor'10mg'yEqaomJRPrgG$  daily Application submitted to az&me patient assistance Escribed farxiga $RemoveBeforeDE'10mg'BpoQXXcNgFqNpws$  to [facility] Denies hypoglycemic/hyperglycemic symptoms Discussed meal planning options and Plate method for healthy eating Avoid sugary drinks and desserts Incorporate balanced protein, non starchy veggies, 1 serving of carbohydrate with each meal Increase water intake Increase physical activity as able Current exercise: n/a Assessed patient finances. Submitted application to az&me PAP for farxiga  Subjective: [name] is an 66 y.o. year old male who is a primary patient of Clifford Norlander, DO.  The CCM team was consulted for assistance with disease management and care coordination needs.    Engaged with patient by telephone for initial visit in response to provider referral for pharmacy case management and/or care coordination services.   Consent to Services:  The patient was given information about Chronic Care Management services, agreed to services, and gave verbal consent prior to initiation of services.  Please see initial visit note for detailed documentation.   Patient Care Team: Clifford Norlander, DO as PCP - General (Family Medicine) Minus Breeding, MD as PCP - Cardiology (Cardiology) Liana Gerold, MD as Consulting Physician (Nephrology) Ilean China, RN as Case Manager Ulla Gallo, MD as Consulting Physician (Dermatology) Trula Slade, DPM as Consulting Physician (Podiatry) Lavera Guise, [facility] as Pharmacist (Family Medicine)   Objective:  Lab Results  Component Value Date   CREATININE 1.87  (H) 03/12/2022   CREATININE 1.90 (H) 12/17/2021   CREATININE 2.28 (H) 07/27/2021    Lab Results  Component Value Date   HGBA1C 7.9 (H) 03/12/2022   Last diabetic Eye exam:  Lab Results  Component Value Date/Time   HMDIABEYEEXA Retinopathy (A) 10/15/2020 12:00 AM    Last diabetic Foot exam: No results found for: HMDIABFOOTEX      Component Value Date/Time   CHOL 110 07/21/2021 0348   CHOL 138 10/01/2019 1133   TRIG 120 07/21/2021 0348   HDL 25 (L) 07/21/2021 0348   HDL 37 (L) 10/01/2019 1133   CHOLHDL 4.4 07/21/2021 0348   VLDL 24 07/21/2021 0348   LDLCALC 61 07/21/2021 0348   LDLCALC 71 10/01/2019 1133       Latest Ref Rng & Units 03/12/2022    9:52 AM 12/17/2021    1:50 PM 07/21/2021    3:48 AM  Hepatic Function  Total Protein 6.0 - 8.5 g/dL 5.9    5.7    Albumin 3.8 - 4.8 g/dL 4.0   4.3   2.6    AST 0 - 40 IU/L 16    15    ALT 0 - 44 IU/L 15    11    Alk Phosphatase 44 - 121 IU/L 98    59    Total Bilirubin 0.0 - 1.2 mg/dL 0.5    1.2    Bilirubin, Direct 0.0 - 0.2 mg/dL   0.2      Lab Results  Component Value Date/Time   TSH 3.270 03/12/2022 09:52 AM   TSH 3.340 03/06/2019 11:30 AM   FREET4 1.42 03/12/2022 09:52 AM       Latest Ref Rng & Units 03/12/2022    9:52 AM 12/17/2021  1:50 PM 07/24/2021    3:26 AM  CBC  WBC 3.4 - 10.8 x10E3/uL 7.4   9.8   8.5    Hemoglobin 13.0 - 17.7 g/dL 10.4   11.0   8.6    Hematocrit 37.5 - 51.0 % 31.7   32.8   25.7    Platelets 150 - 450 x10E3/uL 151   201   263      Lab Results  Component Value Date/Time   VD25OH 35.2 12/17/2021 01:50 PM    Clinical ASCVD: Yes  The ASCVD Risk score (Arnett DK, et al., 2019) failed to calculate for the following reasons:   The patient has a prior MI or stroke diagnosis    Other: (CHADS2VASc if Afib, PHQ9 if depression, MMRC or CAT for COPD, ACT, DEXA)  Social History   Tobacco Use  Smoking Status Never  Smokeless Tobacco Never   BP Readings from Last 3 Encounters:  03/16/22 (!)  149/70  12/23/21 (!) 168/74  12/17/21 112/63   Pulse Readings from Last 3 Encounters:  03/16/22 64  12/23/21 63  12/17/21 (!) 58   Wt Readings from Last 3 Encounters:  03/16/22 203 lb 3.2 oz (92.2 kg)  03/11/22 200 lb (90.7 kg)  12/23/21 206 lb (93.4 kg)    Assessment: Review of patient past medical history, allergies, medications, health status, including review of consultants reports, laboratory and other test data, was performed as part of comprehensive evaluation and provision of chronic care management services.   SDOH:  (Social Determinants of Health) assessments and interventions performed:    CCM Care Plan  No Known Allergies  Medications Reviewed Today     Reviewed by Lavera Guise, [facility] (Pharmacist) on 04/20/22 at 34  Med List Status: <None>   Medication Order Taking? Sig Documenting Provider Last Dose Status Informant  amLODipine (NORVASC) 10 MG tablet [id number] No Take 1 tablet (10 mg total) by mouth daily. Margie Billet, NP Taking Active   aspirin 81 MG EC tablet [id number] No Take 1 tablet (81 mg total) by mouth daily. Swallow whole. Kroeger, Lorelee Cover., PA-C Taking Active Multiple Informants  atorvastatin (LIPITOR) 80 MG tablet [id number] No Take 1 tablet (80 mg total) by mouth daily. Margie Billet, NP Taking Active   carvedilol (COREG) 12.5 MG tablet [id number] No Take 1 tablet (12.5 mg total) by mouth 2 (two) times daily with a meal. Margie Billet, NP Taking Active   cloNIDine (CATAPRES) 0.3 MG tablet [id number]  TAKE (1) TABLET TWICE A DAY. Ronnie Doss M, DO  Active   clopidogrel (PLAVIX) 75 MG tablet [id number] No Take 1 tablet (75 mg total) by mouth daily. Minus Breeding, MD Taking Active   dapagliflozin propanediol (FARXIGA) 10 MG TABS tablet [id number] Yes Take 1 tablet (10 mg total) by mouth daily before breakfast. Ronnie Doss M, DO  Active   furosemide (LASIX) 40 MG tablet [id number] No Take 1 tablet (40 mg total) by mouth daily. Margie Billet, NP Taking Active    gabapentin (NEURONTIN) 100 MG capsule [id number]  TAKE ONE CAPSULE TWICE DAILY Ronnie Doss M, DO  Active   insulin degludec (TRESIBA FLEXTOUCH) 100 UNIT/ML FlexTouch Pen [id number] No Inject 50-60 Units into the skin daily. (increase by 2 units every other day until sugar is below 150) Clifford Norlander, DO Taking Active            Med Note (HOPKINS, AMY E   Thu Mar 11, 2022 10:37 AM) 56 U every morning  Insulin Pen Needle (  PEN NEEDLES) 31G X 5 MM MISC [id number] No Use daily with insulin Dx E11.9 Clifford Norlander, DO Taking Active Multiple Informants  isosorbide mononitrate (IMDUR) 60 MG 24 hr tablet [id number] No TAKE 1 TABLET DAILY Hochrein, James, MD Taking Active   losartan (COZAAR) 25 MG tablet [id number] No Take by mouth. [provider] Taking Active   nitroGLYCERIN (NITROSTAT) 0.4 MG SL tablet [id number] No Place 1 tablet (0.4 mg total) under the tongue every 5 (five) minutes as needed for chest pain.  Patient not taking: Reported on 03/16/2022   Abigail Butts., PA-C Not Taking Active Multiple Informants            Patient Active Problem List   Diagnosis Date Noted   Morbid obesity ([place]) 08/19/2021   Coronary artery disease involving native coronary artery of native heart with angina pectoris ([place])    Congestive heart failure (CHF) ([place]) 07/19/2021   Acute combined systolic and diastolic heart failure ([place]) 07/19/2021   CAD, multiple vessel 06/25/2021   Elevated troponin    DM (diabetes mellitus) type II, controlled, with peripheral vascular disorder (HCC)    Chronic kidney disease (CKD) stage G3b/A2, moderately decreased glomerular filtration rate (GFR) between 30-44 mL/min/1.73 square meter and albuminuria creatinine ratio between 30-299 mg/g (HCC)    Unstable angina ([place]) 06/07/2021   NSTEMI (non-ST elevated myocardial infarction) ([place]) 06/07/2021   Chest pain 04/07/2021   Personal history of diabetic foot ulcer 06/18/2020   History of amputation of great  toe ([place]) 06/18/2020   Osteomyelitis of great toe of right foot ([place])    Diabetic ulcer of toe of right foot associated with diabetes mellitus due to underlying condition, with necrosis of bone ([place])    Osteomyelitis ([place]) 05/15/2020   AKI (acute kidney injury) ([place]) 05/15/2020   OSA (obstructive sleep apnea) 05/15/2020   Acute kidney failure, unspecified ([place]) 05/15/2020   Cellulitis of right foot 05/07/2020   Type 2 diabetes mellitus ([place]) 05/07/2020   Cellulitis of right lower extremity    Diabetic infection of right foot ([place])    Cellulitis of great toe, right    Diabetic ulcer of toe of right foot associated with type 2 diabetes mellitus, with fat layer exposed ([place]) 04/15/2020   OSA on CPAP 09/01/2018   S/P lumbar fusion 07/06/2018   DDD (degenerative disc disease), lumbar 02/28/2018   Hypertension associated with diabetes ([place]) 02/23/2017   Benign prostatic hyperplasia with incomplete bladder emptying 02/23/2017   Vitamin D deficiency 02/23/2017   Hyperlipidemia associated with type 2 diabetes mellitus ([place]) 02/23/2017   Type 2 diabetes mellitus with stage 2 chronic kidney disease, without long-term current use of insulin ([place]) 02/23/2017   Hyperlipidemia, unspecified 02/23/2017   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    Immunization History  Administered Date(s) Administered   Influenza,inj,Quad PF,6+ Mos 08/25/2017, 09/01/2018   Influenza,inj,quad, With Preservative 09/15/2017, 09/11/2018   Pneumococcal Polysaccharide-23 04/14/2017    Conditions to be addressed/monitored: DMII and CKD Stage 3B  Care Plan : PHARMD MEDICATION MANAGEMENT  Updates made by Lavera Guise, [place] since 04/20/2022 12:00 AM     Problem: DISEASE PROGRESSION PREVENTION      Long-Range Goal: T2DM, CKD   This Visit's Progress: Not on track  Note:   Current Barriers:  Unable to independently afford treatment regimen Unable to achieve control of T2DM ,CKD    Pharmacist Clinical  Goal(s):  patient will verbalize ability to afford treatment regimen achieve control of T2DM, CKD as evidenced by  GOAL A1C, IMPROVED GFR  through collaboration with PharmD and provider.   Interventions: 1:1 collaboration with Clifford Norlander, DO regarding development and update of comprehensive plan of care as evidenced by provider attestation and co-signature Inter-disciplinary care team collaboration (see longitudinal plan of care) Comprehensive medication review performed; medication list updated in electronic medical record  Diabetes: New goal. Uncontrolled-A1C 7.9%, GFR 39; current treatment:tresiba 56 units daily, jardiance $RemoveBeforeDE'10mg'LEuOktsxoqSddRk$  daily;  Due to ease of patient assistance and better evidence in CKD/CV, will transition jardiance to -->farxiga $RemoveBefor'10mg'JilHHKYyfUJL$  daily Application submitted to az&me patient assistance Escribed farxiga $RemoveBeforeDE'10mg'bqdMUcKAywMGsDS$  to [facility] Denies hypoglycemic/hyperglycemic symptoms Discussed meal planning options and Plate method for healthy eating Avoid sugary drinks and desserts Incorporate balanced protein, non starchy veggies, 1 serving of carbohydrate with each meal Increase water intake Increase physical activity as able Current exercise: n/a Assessed patient finances. Submitted application to az&me PAP for farxiga   Patient Goals/Self-Care Activities patient will:  - take medications as prescribed as evidenced by patient report and record review check glucose daily or if symptomatic, document, and provide at future appointments collaborate with provider on medication access solutions target a minimum of 150 minutes of moderate intensity exercise weekly engage in dietary modifications by   FOLLOWING A HEART HEALTHY DIET/HEALTHY PLATE METHOD       Medication Assistance: Application for [facility]  medication assistance program. in process.  Anticipated assistance start date TBD.  See plan of care for additional detail.  Follow Up:  Patient agrees to Care Plan and  Follow-up.  Plan: Telephone follow up appointment with care management team member scheduled for:  1 MONTH    Regina Eck, PharmD, BCPS Clinical Pharmacist, [place]  II Phone [phone]

## 2022-04-21 DIAGNOSIS — D631 Anemia in chronic kidney disease: Secondary | ICD-10-CM | POA: Diagnosis not present

## 2022-04-21 DIAGNOSIS — N19 Unspecified kidney failure: Secondary | ICD-10-CM | POA: Diagnosis not present

## 2022-04-21 DIAGNOSIS — N189 Chronic kidney disease, unspecified: Secondary | ICD-10-CM | POA: Diagnosis not present

## 2022-04-21 DIAGNOSIS — E1122 Type 2 diabetes mellitus with diabetic chronic kidney disease: Secondary | ICD-10-CM | POA: Diagnosis not present

## 2022-04-21 DIAGNOSIS — R808 Other proteinuria: Secondary | ICD-10-CM | POA: Diagnosis not present

## 2022-04-21 DIAGNOSIS — I129 Hypertensive chronic kidney disease with stage 1 through stage 4 chronic kidney disease, or unspecified chronic kidney disease: Secondary | ICD-10-CM | POA: Diagnosis not present

## 2022-04-28 ENCOUNTER — Ambulatory Visit (INDEPENDENT_AMBULATORY_CARE_PROVIDER_SITE_OTHER): Payer: PPO | Admitting: Pharmacist

## 2022-04-28 DIAGNOSIS — Z794 Long term (current) use of insulin: Secondary | ICD-10-CM

## 2022-04-28 DIAGNOSIS — N1832 Chronic kidney disease, stage 3b: Secondary | ICD-10-CM

## 2022-05-04 NOTE — Patient Instructions (Addendum)
Visit Information  Following are the goals we discussed today:  Current Barriers:  Unable to independently afford treatment regimen Unable to achieve control of T2DM ,CKD   Pharmacist Clinical Goal(s):  patient will verbalize ability to afford treatment regimen achieve control of T2DM, CKD as evidenced by GOAL A1C, IMPROVED GFR through collaboration with PharmD and provider.   Interventions: 1:1 collaboration with [name], DO regarding development and update of comprehensive plan of care as evidenced by provider attestation and co-signature Inter-disciplinary care team collaboration (see longitudinal plan of care) Comprehensive medication review performed; medication list updated in electronic medical record  Diabetes: Goal on Track (progressing): YES. Uncontrolled-A1C 7.9%, GFR 39; current treatment: tresiba 56 units daily, farxiga '10mg'$  daily;  Due to ease of patient assistance and better evidence in CKD/CV transitioned jardiance to -->farxiga '10mg'$  daily Application submitted to az&me patient assistance--patient approved Escribed [facility] '10mg'$  [facility] Patient received; tolerating well Denies hypoglycemic/hyperglycemic symptoms Discussed meal planning options and Plate method for healthy eating Avoid sugary drinks and desserts Incorporate balanced protein, non starchy veggies, 1 serving of carbohydrate with each meal Increase water intake Increase physical activity as able Current exercise: n/a Assessed patient finances. Application approved to az&me PAP for farxiga   Patient Goals/Self-Care Activities patient will:  - take medications as prescribed as evidenced by patient report and record review check glucose daily or if symptomatic, document, and provide at future appointments collaborate with provider on medication access solutions target a minimum of 150 minutes of moderate intensity exercise weekly engage in dietary modifications by FOLLOWING A  HEART HEALTHY DIET/HEALTHY PLATE METHOD    Plan: Telephone follow up appointment with care management team member scheduled for:  3 months  Signature Regina Eck, PharmD, BCPS Clinical Pharmacist, [place]  II Phone [phone]   Please call the care guide team at [phone] if you need to cancel or reschedule your appointment.   Patient verbalizes understanding of instructions and care plan provided today and agrees to view in [place]. Active MyChart status and patient understanding of how to access instructions and care plan via MyChart confirmed with patient.

## 2022-05-04 NOTE — Progress Notes (Cosign Needed)
Chronic Care Management Pharmacy Note  04/28/2022 Name:  [name] MRN:  [id number] DOB:  05/12/1956  Summary:  Diabetes: Goal on Track (progressing): YES. Uncontrolled-A1C 7.9%, GFR 39; current treatment: tresiba 56 units daily, farxiga $RemoveBefore'10mg'vLBtCJYiMtWXZ$  daily;  Due to ease of patient assistance and better evidence in CKD/CV transitioned jardiance to -->farxiga $RemoveBefor'10mg'xPssNgLTTCjI$  daily patient approved for az&me patient assistance--farxiga Escribed farxiga $RemoveBeforeDE'10mg'DHNAWpCnAxlXanU$  to [facility] Patient received; tolerating well Denies hypoglycemic/hyperglycemic symptoms Discussed meal planning options and Plate method for healthy eating Avoid sugary drinks and desserts Incorporate balanced protein, non starchy veggies, 1 serving of carbohydrate with each meal Increase water intake Increase physical activity as able Current exercise: n/a Assessed patient finances. Application approved to az&me PAP for farxiga Subjective: [name] is an 66 y.o. year old male who is a primary patient of [name], DO.  The CCM team was consulted for assistance with disease management and care coordination needs.    Engaged with patient by telephone for follow up visit in response to provider referral for pharmacy case management and/or care coordination services.   Consent to Services:  The patient was given information about Chronic Care Management services, agreed to services, and gave verbal consent prior to initiation of services.  Please see initial visit note for detailed documentation.   Patient Care Team: [name], DO as PCP - General (Family Medicine) Minus Breeding, MD as PCP - Cardiology (Cardiology) Liana Gerold, MD as Consulting Physician (Nephrology) Ilean China, RN as Case Manager Ulla Gallo, MD as Consulting Physician (Dermatology) Trula Slade, DPM as Consulting Physician (Podiatry) Lavera Guise, [facility] as Pharmacist (Family  Medicine)  Objective:  Lab Results  Component Value Date   CREATININE 1.87 (H) 03/12/2022   CREATININE 1.90 (H) 12/17/2021   CREATININE 2.28 (H) 07/27/2021    Lab Results  Component Value Date   HGBA1C 7.9 (H) 03/12/2022   Last diabetic Eye exam:  Lab Results  Component Value Date/Time   HMDIABEYEEXA Retinopathy (A) 10/15/2020 12:00 AM    Last diabetic Foot exam: No results found for: "HMDIABFOOTEX"      Component Value Date/Time   CHOL 110 07/21/2021 0348   CHOL 138 10/01/2019 1133   TRIG 120 07/21/2021 0348   HDL 25 (L) 07/21/2021 0348   HDL 37 (L) 10/01/2019 1133   CHOLHDL 4.4 07/21/2021 0348   VLDL 24 07/21/2021 0348   LDLCALC 61 07/21/2021 0348   LDLCALC 71 10/01/2019 1133       Latest Ref Rng & Units 03/12/2022    9:52 AM 12/17/2021    1:50 PM 07/21/2021    3:48 AM  Hepatic Function  Total Protein 6.0 - 8.5 g/dL 5.9   5.7   Albumin 3.8 - 4.8 g/dL 4.0  4.3  2.6   AST 0 - 40 IU/L 16   15   ALT 0 - 44 IU/L 15   11   Alk Phosphatase 44 - 121 IU/L 98   59   Total Bilirubin 0.0 - 1.2 mg/dL 0.5   1.2   Bilirubin, Direct 0.0 - 0.2 mg/dL   0.2     Lab Results  Component Value Date/Time   TSH 3.270 03/12/2022 09:52 AM   TSH 3.340 03/06/2019 11:30 AM   FREET4 1.42 03/12/2022 09:52 AM       Latest Ref Rng & Units 03/12/2022    9:52 AM 12/17/2021    1:50 PM 07/24/2021    3:26 AM  CBC  WBC 3.4 - 10.8 x10E3/uL 7.4  9.8  8.5   Hemoglobin 13.0 - 17.7 g/dL 10.4  11.0  8.6   Hematocrit 37.5 - 51.0 % 31.7  32.8  25.7   Platelets 150 - 450 x10E3/uL 151  201  263     Lab Results  Component Value Date/Time   VD25OH 35.2 12/17/2021 01:50 PM    Clinical ASCVD: Yes  The ASCVD Risk score (Arnett DK, et al., 2019) failed to calculate for the following reasons:   The patient has a prior MI or stroke diagnosis    Other: (CHADS2VASc if Afib, PHQ9 if depression, MMRC or CAT for COPD, ACT, DEXA)  Social History   Tobacco Use  Smoking Status Never  Smokeless Tobacco  Never   BP Readings from Last 3 Encounters:  03/16/22 (!) 149/70  12/23/21 (!) 168/74  12/17/21 112/63   Pulse Readings from Last 3 Encounters:  03/16/22 64  12/23/21 63  12/17/21 (!) 58   Wt Readings from Last 3 Encounters:  03/16/22 203 lb 3.2 oz (92.2 kg)  03/11/22 200 lb (90.7 kg)  12/23/21 206 lb (93.4 kg)    Assessment: Review of patient past medical history, allergies, medications, health status, including review of consultants reports, laboratory and other test data, was performed as part of comprehensive evaluation and provision of chronic care management services.   SDOH:  (Social Determinants of Health) assessments and interventions performed:    CCM Care Plan  No Known Allergies  Medications Reviewed Today     Reviewed by Lavera Guise, [facility] (Pharmacist) on 05/04/22 at 2117  Med List Status: <None>   Medication Order Taking? Sig Documenting Provider Last Dose Status Informant  amLODipine (NORVASC) 10 MG tablet [id number] No Take 1 tablet (10 mg total) by mouth daily. Margie Billet, NP Taking Active   aspirin 81 MG EC tablet [id number] No Take 1 tablet (81 mg total) by mouth daily. Swallow whole. Kroeger, Lorelee Cover., PA-C Taking Active Multiple Informants  atorvastatin (LIPITOR) 80 MG tablet [id number] No Take 1 tablet (80 mg total) by mouth daily. Margie Billet, NP Taking Active   carvedilol (COREG) 12.5 MG tablet [id number] No Take 1 tablet (12.5 mg total) by mouth 2 (two) times daily with a meal. Margie Billet, NP Taking Active   cloNIDine (CATAPRES) 0.3 MG tablet [id number]  TAKE (1) TABLET TWICE A DAY. Ronnie Doss M, DO  Active   clopidogrel (PLAVIX) 75 MG tablet [id number] No Take 1 tablet (75 mg total) by mouth daily. Minus Breeding, MD Taking Active   dapagliflozin propanediol (FARXIGA) 10 MG TABS tablet [id number]  Take 1 tablet (10 mg total) by mouth daily before breakfast. Ronnie Doss M, DO  Active   furosemide (LASIX) 40 MG tablet [id number] No Take 1 tablet  (40 mg total) by mouth daily. Margie Billet, NP Taking Active   gabapentin (NEURONTIN) 100 MG capsule [id number]  TAKE ONE CAPSULE TWICE DAILY Ronnie Doss M, DO  Active   insulin degludec (TRESIBA FLEXTOUCH) 100 UNIT/ML FlexTouch Pen [id number] No Inject 50-60 Units into the skin daily. (increase by 2 units every other day until sugar is below 150) [name], DO Taking Active            Med Note (HOPKINS, AMY E   Thu Mar 11, 2022 10:37 AM) 56 U every morning  Insulin Pen Needle (PEN NEEDLES) 31G X 5 MM MISC [id number] No Use daily with insulin Dx E11.9 [name], DO Taking Active  Multiple Informants  isosorbide mononitrate (IMDUR) 60 MG 24 hr tablet [id number] No TAKE 1 TABLET DAILY Hochrein, James, MD Taking Active   losartan (COZAAR) 25 MG tablet [id number] No Take by mouth. [provider] Taking Active   nitroGLYCERIN (NITROSTAT) 0.4 MG SL tablet [id number] No Place 1 tablet (0.4 mg total) under the tongue every 5 (five) minutes as needed for chest pain.  Patient not taking: Reported on 03/16/2022   Abigail Butts., PA-C Not Taking Active Multiple Informants            Patient Active Problem List   Diagnosis Date Noted   Morbid obesity ([place]) 08/19/2021   Coronary artery disease involving native coronary artery of native heart with angina pectoris ([place])    Congestive heart failure (CHF) ([place]) 07/19/2021   Acute combined systolic and diastolic heart failure ([place]) 07/19/2021   CAD, multiple vessel 06/25/2021   Elevated troponin    DM (diabetes mellitus) type II, controlled, with peripheral vascular disorder (HCC)    Chronic kidney disease (CKD) stage G3b/A2, moderately decreased glomerular filtration rate (GFR) between 30-44 mL/min/1.73 square meter and albuminuria creatinine ratio between 30-299 mg/g (HCC)    Unstable angina ([place]) 06/07/2021   NSTEMI (non-ST elevated myocardial infarction) ([place]) 06/07/2021   Chest pain 04/07/2021   Personal history of  diabetic foot ulcer 06/18/2020   History of amputation of great toe ([place]) 06/18/2020   Osteomyelitis of great toe of right foot ([place])    Diabetic ulcer of toe of right foot associated with diabetes mellitus due to underlying condition, with necrosis of bone ([place])    Osteomyelitis ([place]) 05/15/2020   AKI (acute kidney injury) ([place]) 05/15/2020   OSA (obstructive sleep apnea) 05/15/2020   Acute kidney failure, unspecified ([place]) 05/15/2020   Cellulitis of right foot 05/07/2020   Type 2 diabetes mellitus ([place]) 05/07/2020   Cellulitis of right lower extremity    Diabetic infection of right foot ([place])    Cellulitis of great toe, right    Diabetic ulcer of toe of right foot associated with type 2 diabetes mellitus, with fat layer exposed ([place]) 04/15/2020   OSA on CPAP 09/01/2018   S/P lumbar fusion 07/06/2018   DDD (degenerative disc disease), lumbar 02/28/2018   Hypertension associated with diabetes ([place]) 02/23/2017   Benign prostatic hyperplasia with incomplete bladder emptying 02/23/2017   Vitamin D deficiency 02/23/2017   Hyperlipidemia associated with type 2 diabetes mellitus ([place]) 02/23/2017   Type 2 diabetes mellitus with stage 2 chronic kidney disease, without long-term current use of insulin ([place]) 02/23/2017   Hyperlipidemia, unspecified 02/23/2017   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    Immunization History  Administered Date(s) Administered   Influenza,inj,Quad PF,6+ Mos 08/25/2017, 09/01/2018   Influenza,inj,quad, With Preservative 09/15/2017, 09/11/2018   Pneumococcal Polysaccharide-23 04/14/2017    Conditions to be addressed/monitored: CAD and DMII  Care Plan : PHARMD MEDICATION MANAGEMENT  Updates made by Lavera Guise, [place] since 05/04/2022 12:00 AM     Problem: DISEASE PROGRESSION PREVENTION      Long-Range Goal: T2DM, CKD   This Visit's Progress: On track  Recent Progress: Not on track  Note:   Current Barriers:  Unable to independently afford  treatment regimen Unable to achieve control of T2DM ,CKD   Pharmacist Clinical Goal(s):  patient will verbalize ability to afford treatment regimen achieve control of T2DM, CKD as evidenced by GOAL A1C, IMPROVED GFR  through collaboration with PharmD and provider.   Interventions: 1:1 collaboration with Ronnie Doss  M, DO regarding development and update of comprehensive plan of care as evidenced by provider attestation and co-signature Inter-disciplinary care team collaboration (see longitudinal plan of care) Comprehensive medication review performed; medication list updated in electronic medical record  Diabetes: Goal on Track (progressing): YES. Uncontrolled-A1C 7.9%, GFR 39; current treatment: tresiba 56 units daily, farxiga $RemoveBefore'10mg'FGPXtzDWzobOr$  daily;  Due to ease of patient assistance and better evidence in CKD/CV transitioned jardiance to -->farxiga $RemoveBefor'10mg'UiQNTTXoVHCt$  daily Application submitted to az&me patient assistance--patient approved Escribed farxiga $RemoveBeforeDE'10mg'VeNFcmZBqIICrVU$  to [facility] Patient received; tolerating well Denies hypoglycemic/hyperglycemic symptoms Discussed meal planning options and Plate method for healthy eating Avoid sugary drinks and desserts Incorporate balanced protein, non starchy veggies, 1 serving of carbohydrate with each meal Increase water intake Increase physical activity as able Current exercise: n/a Assessed patient finances. Application approved to az&me PAP for farxiga   Patient Goals/Self-Care Activities patient will:  - take medications as prescribed as evidenced by patient report and record review check glucose daily or if symptomatic, document, and provide at future appointments collaborate with provider on medication access solutions target a minimum of 150 minutes of moderate intensity exercise weekly engage in dietary modifications by   FOLLOWING A HEART HEALTHY DIET/HEALTHY PLATE METHOD       Medication Assistance:  farxiga obtained through az&me medication  assistance program.  Enrollment ends 11/14/22 [facility] to [facility] in [place]: Telephone follow up appointment with care management team member scheduled for:  3 months   Regina Eck, PharmD, BCPS Clinical Pharmacist, [place]  II Phone [phone]

## 2022-05-05 ENCOUNTER — Encounter: Payer: Self-pay | Admitting: Infectious Diseases

## 2022-05-14 DIAGNOSIS — Z794 Long term (current) use of insulin: Secondary | ICD-10-CM

## 2022-05-14 DIAGNOSIS — N1832 Chronic kidney disease, stage 3b: Secondary | ICD-10-CM

## 2022-05-14 DIAGNOSIS — E1165 Type 2 diabetes mellitus with hyperglycemia: Secondary | ICD-10-CM

## 2022-05-17 NOTE — Chronic Care Management (AMB) (Signed)
Erroneous encounter. Please disregard.

## 2022-06-07 ENCOUNTER — Ambulatory Visit: Payer: Self-pay | Admitting: *Deleted

## 2022-06-07 DIAGNOSIS — E1165 Type 2 diabetes mellitus with hyperglycemia: Secondary | ICD-10-CM

## 2022-06-07 NOTE — Patient Instructions (Signed)
Yuepheng Wayne Kielbasa  I have previously worked with you through the Chronic Care Management Program at [facility]. Due to program changes I am removing myself from your care team because you've either met our goals, your conditions are stable and no longer require care management, or we haven't engaged within the past 6 months. If you are currently active with another CCM Team Member, you will remain active with them unless they reach out to you with additional information. If you feel that you need RN Care Management services in the future, please talk with your primary care provider to discuss re-engagement with the RN Care Manager that will be assigned to [facility]. This does not affect your status as a patient at [facility].   Thank you for allowing me to participate in your your healthcare journey.  Kristen Hudy, BSN, RN-BC Embedded Chronic Care Manager [facility] / [facility] Care Management Direct Dial: [phone]  

## 2022-06-07 NOTE — Chronic Care Management (AMB) (Signed)
Arist[place] Wayne [place]  I have previously worked with you through the Chronic Care Management Program at [facility]. Due to program changes I am removing myself from your care team because you've either met our goals, your conditions are stable and no longer require care management, or we haven't engaged within the past 6 months. If you are currently active with another CCM Team Member, you will remain active with them unless they reach out to you with additional information. If you feel that you need RN Care Management services in the future, please talk with your primary care provider to discuss re-engagement with the RN Care Manager that will be assigned to [facility]. This does not affect your status as a patient at [facility].   Thank you for allowing me to participate in your your healthcare journey.  Carlon Chaloux, BSN, RN-BC Embedded Chronic Care Manager [facility] / [facility] Care Management Direct Dial: [phone]  

## 2022-06-11 ENCOUNTER — Other Ambulatory Visit: Payer: Self-pay

## 2022-06-11 ENCOUNTER — Telehealth: Payer: Self-pay | Admitting: Family Medicine

## 2022-06-11 ENCOUNTER — Other Ambulatory Visit: Payer: Self-pay | Admitting: Cardiology

## 2022-06-11 MED ORDER — ATORVASTATIN CALCIUM 80 MG PO TABS: 80.0000 mg | ORAL_TABLET | Freq: Every day | ORAL | 1 refills | Status: DC

## 2022-06-11 NOTE — Telephone Encounter (Signed)
  Prescription Request  06/11/2022  Is this a "Controlled Substance" medicine? no  Have you seen your PCP in the last 2 weeks? no  If YES, route message to pool  -  If NO, patient needs to be scheduled for appointment.  What is the name of the medication or equipment? atorvastatin (LIPITOR) 80 MG tablet carvedilol (COREG) 12.5 MG tablet  Have you contacted your pharmacy to request a refill? Yes    Which pharmacy would you like this sent to? [place], [place]   Patient notified that their request is being sent to the clinical staff for review and that they should receive a response within 2 business days.

## 2022-06-11 NOTE — Telephone Encounter (Signed)
Informed pt that these meds were prescribed by the cardiologist. The Atorvastatin was RFd today, the Carvedilol was not requested, recommended to ask the pharmacy to send a request for it.

## 2022-06-14 ENCOUNTER — Telehealth: Payer: Self-pay | Admitting: Cardiology

## 2022-06-14 NOTE — Telephone Encounter (Signed)
*  STAT* If patient is at the pharmacy, call can be transferred to refill team.   1. Which medications need to be refilled? (please list name of each medication and dose if known)  carvedilol (COREG) 12.5 MG tablet    2. Which pharmacy/location (including street and city if local pharmacy) is medication to be sent to?  [facility] [place] [place]   3. Do they need a 30 day or 90 day supply? 90  Pharmacy called requesting a refill for pt and they are completely out

## 2022-06-15 MED ORDER — CARVEDILOL 12.5 MG PO TABS: ORAL_TABLET | ORAL | 6 refills | Status: DC

## 2022-06-16 ENCOUNTER — Encounter: Payer: Self-pay | Admitting: Family Medicine

## 2022-06-16 ENCOUNTER — Ambulatory Visit (INDEPENDENT_AMBULATORY_CARE_PROVIDER_SITE_OTHER): Payer: PPO | Admitting: Family Medicine

## 2022-06-16 VITALS — BP 170/79 | HR 61 | Temp 97.8°F | Ht 72.0 in | Wt 209.2 lb

## 2022-06-16 DIAGNOSIS — I152 Hypertension secondary to endocrine disorders: Secondary | ICD-10-CM

## 2022-06-16 DIAGNOSIS — E785 Hyperlipidemia, unspecified: Secondary | ICD-10-CM

## 2022-06-16 DIAGNOSIS — E1159 Type 2 diabetes mellitus with other circulatory complications: Secondary | ICD-10-CM | POA: Diagnosis not present

## 2022-06-16 DIAGNOSIS — D692 Other nonthrombocytopenic purpura: Secondary | ICD-10-CM | POA: Diagnosis not present

## 2022-06-16 DIAGNOSIS — I2510 Atherosclerotic heart disease of native coronary artery without angina pectoris: Secondary | ICD-10-CM | POA: Diagnosis not present

## 2022-06-16 DIAGNOSIS — E1122 Type 2 diabetes mellitus with diabetic chronic kidney disease: Secondary | ICD-10-CM

## 2022-06-16 DIAGNOSIS — N1832 Chronic kidney disease, stage 3b: Secondary | ICD-10-CM | POA: Diagnosis not present

## 2022-06-16 DIAGNOSIS — E1169 Type 2 diabetes mellitus with other specified complication: Secondary | ICD-10-CM | POA: Diagnosis not present

## 2022-06-16 LAB — BAYER DCA HB A1C WAIVED: HB A1C (BAYER DCA - WAIVED): 7.6 % — ABNORMAL HIGH (ref 4.8–5.6)

## 2022-06-16 MED ORDER — CLONIDINE HCL 0.3 MG PO TABS: ORAL_TABLET | ORAL | 3 refills | Status: DC

## 2022-06-16 MED ORDER — GABAPENTIN 100 MG PO CAPS: 100.0000 mg | ORAL_CAPSULE | Freq: Two times a day (BID) | ORAL | 3 refills | Status: DC

## 2022-06-16 NOTE — Progress Notes (Signed)
Subjective: CC:DM PCP: [name], DO LYY:[id number] [name] is a 66 y.o. male presenting to clinic today for:  1. Type 2 Diabetes with hypertension, hyperlipidemia w/ CAD and CKD3b:  Patient sees Dr. [name] with last office visit in June.  He recommended ongoing treatment with losartan, Carrington Clamp and Jardiance.  No hypoglycemic episodes reported.  Denies any genitourinary concerns including dysuria or genital discomfort/discoloration  Last eye exam: UTD Last foot exam: UTD Last A1c:  Lab Results  Component Value Date   HGBA1C 7.9 (H) 03/12/2022   Nephropathy screen indicated?: UTD Last flu, zoster and/or pneumovax:  Immunization History  Administered Date(s) Administered   Influenza,inj,Quad PF,6+ Mos 08/25/2017, 09/01/2018   Influenza,inj,quad, With Preservative 09/15/2017, 09/11/2018   Pneumococcal Polysaccharide-23 04/14/2017    ROS: Occasional chest twinges but not true chest pain or shortness of breath.  No dizziness or falls.  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   ROS: Per HPI  No Known Allergies Past Medical History:  Diagnosis Date   Acute combined systolic and diastolic heart failure ([place]) 07/19/2021   Chronic kidney disease    Complication of anesthesia    Hard to wake   Degenerative joint disease (DJD) of lumbar spine    Diabetes mellitus without complication ([place])    Type II   Essential hypertension 02/23/2017   Lumbar stenosis    NSTEMI (non-ST elevated myocardial infarction) ([place]) 06/07/2021    Current Outpatient Medications:    amLODipine (NORVASC) 10 MG tablet, Take 1 tablet (10 mg total) by mouth daily., Disp: 90 tablet, Rfl: 1   aspirin 81 MG EC tablet, Take 1 tablet (81 mg total) by mouth daily. Swallow whole., Disp: 90 tablet, Rfl: 3   atorvastatin (LIPITOR) 80 MG tablet, Take 1 tablet (80 mg total) by mouth daily., Disp: 90 tablet, Rfl: 1   carvedilol (COREG) 12.5 MG tablet, TAKE 1 TABLET 2 TIMES A DAY WITH A MEAL,  Disp: 60 tablet, Rfl: 6   cloNIDine (CATAPRES) 0.3 MG tablet, TAKE (1) TABLET TWICE A DAY., Disp: 60 tablet, Rfl: 2   clopidogrel (PLAVIX) 75 MG tablet, Take 1 tablet (75 mg total) by mouth daily., Disp: 90 tablet, Rfl: 3   dapagliflozin propanediol (FARXIGA) 10 MG TABS tablet, Take 1 tablet (10 mg total) by mouth daily before breakfast., Disp: 90 tablet, Rfl: 5   furosemide (LASIX) 40 MG tablet, Take 1 tablet (40 mg total) by mouth daily., Disp: 30 tablet, Rfl: 1   gabapentin (NEURONTIN) 100 MG capsule, TAKE ONE CAPSULE TWICE DAILY, Disp: 180 capsule, Rfl: 0   insulin degludec (TRESIBA FLEXTOUCH) 100 UNIT/ML FlexTouch Pen, Inject 50-60 Units into the skin daily. (increase by 2 units every other day until sugar is below 150), Disp: 60 mL, Rfl: 1   Insulin Pen Needle (PEN NEEDLES) 31G X 5 MM MISC, Use daily with insulin Dx E11.9, Disp: 100 each, Rfl: 3   isosorbide mononitrate (IMDUR) 60 MG 24 hr tablet, TAKE 1 TABLET DAILY, Disp: 90 tablet, Rfl: 2   losartan (COZAAR) 25 MG tablet, Take by mouth., Disp: , Rfl:    nitroGLYCERIN (NITROSTAT) 0.4 MG SL tablet, Place 1 tablet (0.4 mg total) under the tongue every 5 (five) minutes as needed for chest pain. 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 Has another son and one daughter    Social Determinants of Health   Financial Resource Strain: Low Risk  (03/11/2022)   Overall Financial Resource Strain (CARDIA)    Difficulty of Paying Living Expenses: Not hard at all  Food Insecurity: No Food Insecurity (03/11/2022)   Hunger Vital Sign    Worried About Running Out of Food in the Last Year: Never true    Ran Out of Food in the Last Year: Never true  Transportation Needs: No Transportation Needs (03/11/2022)   PRAPARE - Hydrologist (Medical): No    Lack of Transportation (Non-Medical): No  Physical Activity: Sufficiently Active (03/11/2022)   Exercise Vital Sign    Days of Exercise per Week: 7 days    Minutes of Exercise per Session: 40 min  Stress: No Stress Concern Present (03/11/2022)   [place]    Feeling of Stress : Not at all  Social Connections: Moderately Isolated (03/11/2022)   Social Connection and Isolation Panel [NHANES]    Frequency of Communication with Friends and Family: More than three times a week    Frequency of Social Gatherings with Friends and Family: More than three times a week    Attends Religious Services: Never    Marine scientist or Organizations: No    Attends Archivist Meetings: Never    Marital Status: Married  Human resources officer Violence: Not At Risk (03/11/2022)   Humiliation, Afraid, Rape, and Kick questionnaire    Fear of Current or Ex-Partner: No    Emotionally Abused: No    Physically Abused: No    Sexually Abused: No   Family History  Problem Relation Age of Onset   Heart attack Father 31       Died suddenly   Hypertension Father    Vision loss Sister        one eye   Cancer Brother        skin   Diabetes Brother     Objective: Office vital signs reviewed. BP (!) 170/79   Pulse 61   Temp 97.8 F (36.6 C)   Ht 6' (1.829 m)   Wt 209 lb 3.2 oz (94.9 kg)   SpO2 98%    BMI 28.37 kg/m   Physical Examination:  General: Awake, alert, well nourished, No acute distress HEENT: Moist mucous membranes.  Slight exophthalmos Cardio: regular rate and rhythm, S1S2 heard, no murmurs appreciated Pulm: clear to auscultation bilaterally, no wheezes, rhonchi or rales; normal work of breathing on room air Skin: Multiple senile purpura, 1 healing skin tear noted along the right forearm  Assessment/ Plan: 66 y.o. male   Type 2 diabetes mellitus with stage 3b chronic kidney disease, without long-term current use  of insulin ([place]) - Plan: Bayer DCA Hb A1c Waived  Hypertension associated with diabetes ([place]) - Plan: cloNIDine (CATAPRES) 0.3 MG tablet  Hyperlipidemia associated with type 2 diabetes mellitus ([place]) - Plan: Lipid Panel  CAD, multiple vessel - Plan: Lipid Panel  Purpura senilis ([place])  Sugar remains uncontrolled with A1c of 7.6 but it is downtrending.  Given his advanced renal disease we will continue current regimen with only slight titration of the insulin if needed for blood sugars above 150 fasting.  Blood pressure was not at goal but patient had not taken any of his blood pressure medicines prior to arrival.  I reviewed his nephrology note which demonstrated controlled blood pressures and no changes will be made  Fasting lipid panel collected.  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  No orders of the defined types were placed in this encounter.    [name], DO [place] [phone]

## 2022-06-17 LAB — LIPID PANEL
Chol/HDL Ratio: 3.5 ratio (ref 0.0–5.0)
Cholesterol, Total: 116 mg/dL (ref 100–199)
HDL: 33 mg/dL — ABNORMAL LOW (ref >39)
LDL Chol Calc (NIH): 61 mg/dL (ref 0–99)
Triglycerides: 119 mg/dL (ref 0–149)
VLDL Cholesterol Cal: 22 mg/dL (ref 5–40)

## 2022-06-21 ENCOUNTER — Other Ambulatory Visit: Payer: Self-pay | Admitting: *Deleted

## 2022-06-21 NOTE — Patient Outreach (Signed)
  Care Coordination   06/21/2022 Name: Ranulfo Kall MRN: [id number] DOB: July 03, 1956   Care Coordination Outreach Attempts:  An unsuccessful telephone outreach was attempted today to offer the patient information about available care coordination services as a benefit of their health plan.   Follow Up Plan:  Additional outreach attempts will be made to offer the patient care coordination information and services.   Encounter Outcome:  No Answer  Care Coordination Interventions Activated:  No   Care Coordination Interventions:  No, not indicated    Anelia Carriveau L. Lavina Hamman, RN, BSN, [place] Coordinator Office number [phone]

## 2022-07-13 ENCOUNTER — Other Ambulatory Visit: Payer: Self-pay | Admitting: *Deleted

## 2022-07-13 ENCOUNTER — Encounter: Payer: Self-pay | Admitting: *Deleted

## 2022-07-13 NOTE — Patient Outreach (Signed)
  Care Coordination   Initial Visit Note   07/13/2022 Name: [name] MRN: [id number] DOB: 1956-11-06  [name] is a 66 y.o. year old male who sees [name], DO for primary care. I spoke with  [name] by phone today.  What matters to the patients health and wellness today?  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  hx of right back surgery but reports no pain of the right side Prefers to complete conservative pain management at this time vs receiving assistance to obtain an appointment at pcp office    Goals Addressed               This Visit's Progress     Patient Stated     Manage left side back pain ([facility]N) (pt-stated)   Not on track     Care Coordination Interventions: Counseled on the importance of reporting any/all new or changed pain symptoms or management strategies to pain management provider Discussed use of relaxation techniques and/or diversional activities to assist with pain reduction (distraction, imagery, relaxation, massage, acupressure, TENS, heat, and cold application Assessed social determinant of health barriers Encouraged not to suffer through the pain and seek assistance from pcp office if his symptoms worsen        SDOH assessments and interventions completed:  Yes  SDOH Interventions Today    Flowsheet Row Most Recent Value  SDOH Interventions   Food Insecurity Interventions Intervention Not Indicated  Financial Strain Interventions Intervention Not Indicated  Housing Interventions Intervention Not Indicated  Physical Activity Interventions Intervention Not Indicated  Stress Interventions Intervention Not Indicated  Transportation Interventions Intervention Not Indicated        Care Coordination Interventions Activated:  Yes  Care Coordination Interventions:  Yes, provided   Follow up  plan: Follow up call scheduled for 07/27/22    Encounter Outcome:  Pt. Visit Completed   Lenin Kuhnle L. Lavina Hamman, RN, BSN, [name] Coordinator Office number [phone]

## 2022-07-13 NOTE — Patient Instructions (Addendum)
Visit Information  Thank you for taking time to visit with me today. Please don't hesitate to contact me if I can be of assistance to you.   Following are the goals we discussed today:   Goals Addressed               This Visit's Progress     Patient Stated     Manage left side back pain ([facility]N) (pt-stated)   Not on track     Care Coordination Interventions: Counseled on the importance of reporting any/all new or changed pain symptoms or management strategies to pain management provider Discussed use of relaxation techniques and/or diversional activities to assist with pain reduction (distraction, imagery, relaxation, massage, acupressure, TENS, heat, and cold application Assessed social determinant of health barriers Encouraged not to suffer through the pain and seek assistance from pcp office if his symptoms worsen        Our next appointment is by telephone on 07/27/22 at 1000  Please call the care guide team at [phone] if you need to cancel or reschedule your appointment.   If you are experiencing a Mental Health or [place] or need someone to talk to, please call the Suicide and Crisis Lifeline: 988 call the [place] National Suicide Prevention Lifeline: 414-051-2595 or TTY: 567 127 0877 TTY 404-262-2712) to talk to a trained counselor call 1-800-273-TALK (toll free, 24 hour hotline) call the [facility]: [phone] call 911   Patient verbalizes understanding of instructions and care plan provided today and agrees to view in [place]. Active MyChart status and patient understanding of how to access instructions and care plan via MyChart confirmed with patient.     The patient has been provided with contact information for the care management team and has been advised to call with any health related questions or concerns.   [place] Lavina Hamman, RN, BSN, [place] Coordinator Office number [phone]

## 2022-07-16 DIAGNOSIS — L821 Other seborrheic keratosis: Secondary | ICD-10-CM | POA: Diagnosis not present

## 2022-07-16 DIAGNOSIS — Z85828 Personal history of other malignant neoplasm of skin: Secondary | ICD-10-CM | POA: Diagnosis not present

## 2022-07-16 DIAGNOSIS — D2371 Other benign neoplasm of skin of right lower limb, including hip: Secondary | ICD-10-CM | POA: Diagnosis not present

## 2022-07-16 DIAGNOSIS — D692 Other nonthrombocytopenic purpura: Secondary | ICD-10-CM | POA: Diagnosis not present

## 2022-07-16 DIAGNOSIS — L570 Actinic keratosis: Secondary | ICD-10-CM | POA: Diagnosis not present

## 2022-07-16 DIAGNOSIS — D225 Melanocytic nevi of trunk: Secondary | ICD-10-CM | POA: Diagnosis not present

## 2022-07-26 ENCOUNTER — Other Ambulatory Visit: Payer: PPO

## 2022-07-26 ENCOUNTER — Ambulatory Visit: Payer: PPO | Admitting: *Deleted

## 2022-07-26 DIAGNOSIS — N19 Unspecified kidney failure: Secondary | ICD-10-CM | POA: Diagnosis not present

## 2022-07-26 DIAGNOSIS — R808 Other proteinuria: Secondary | ICD-10-CM | POA: Diagnosis not present

## 2022-07-26 DIAGNOSIS — I129 Hypertensive chronic kidney disease with stage 1 through stage 4 chronic kidney disease, or unspecified chronic kidney disease: Secondary | ICD-10-CM | POA: Diagnosis not present

## 2022-07-26 DIAGNOSIS — R809 Proteinuria, unspecified: Secondary | ICD-10-CM | POA: Diagnosis not present

## 2022-07-26 DIAGNOSIS — E1122 Type 2 diabetes mellitus with diabetic chronic kidney disease: Secondary | ICD-10-CM | POA: Diagnosis not present

## 2022-07-26 DIAGNOSIS — D631 Anemia in chronic kidney disease: Secondary | ICD-10-CM | POA: Diagnosis not present

## 2022-07-26 DIAGNOSIS — E1129 Type 2 diabetes mellitus with other diabetic kidney complication: Secondary | ICD-10-CM | POA: Diagnosis not present

## 2022-07-26 DIAGNOSIS — E1159 Type 2 diabetes mellitus with other circulatory complications: Secondary | ICD-10-CM

## 2022-07-26 DIAGNOSIS — N189 Chronic kidney disease, unspecified: Secondary | ICD-10-CM | POA: Diagnosis not present

## 2022-07-26 NOTE — Progress Notes (Signed)
Pt came in today for labwork. BP at home this morning was 191/85. Only had a cup of coffee w/ a little 2% milk and before taking his medication. BP in office 145/69 HR 59 O2 99 took medication about 1 1/2 ago. BPs are also elevated in the evenings. Instructed him to make an appt w/ nurse in about 2 weeks and bring his machine to compare readings

## 2022-07-27 ENCOUNTER — Ambulatory Visit: Payer: Self-pay | Admitting: *Deleted

## 2022-07-27 NOTE — Patient Outreach (Signed)
  Care Coordination   07/27/2022 Name: [name] MRN: [id number] DOB: Feb 24, 1956   Care Coordination Outreach Attempts:  An unsuccessful telephone outreach was attempted today to offer the patient information about available care coordination services as a benefit of their health plan.   Follow Up Plan:  Additional outreach attempts will be made to offer the patient care coordination information and services.   Encounter Outcome:  No Answer  Care Coordination Interventions Activated:  No   Care Coordination Interventions:  No, not indicated    SIG [name] L. Lavina Hamman, RN, BSN, [place] Coordinator Office number [phone]

## 2022-07-29 DIAGNOSIS — N19 Unspecified kidney failure: Secondary | ICD-10-CM | POA: Diagnosis not present

## 2022-07-29 DIAGNOSIS — N189 Chronic kidney disease, unspecified: Secondary | ICD-10-CM | POA: Diagnosis not present

## 2022-07-29 DIAGNOSIS — I129 Hypertensive chronic kidney disease with stage 1 through stage 4 chronic kidney disease, or unspecified chronic kidney disease: Secondary | ICD-10-CM | POA: Diagnosis not present

## 2022-07-29 DIAGNOSIS — D631 Anemia in chronic kidney disease: Secondary | ICD-10-CM | POA: Diagnosis not present

## 2022-07-29 DIAGNOSIS — E1122 Type 2 diabetes mellitus with diabetic chronic kidney disease: Secondary | ICD-10-CM | POA: Diagnosis not present

## 2022-07-29 DIAGNOSIS — R808 Other proteinuria: Secondary | ICD-10-CM | POA: Diagnosis not present

## 2022-08-03 ENCOUNTER — Ambulatory Visit: Payer: Self-pay | Admitting: *Deleted

## 2022-08-03 ENCOUNTER — Encounter: Payer: Self-pay | Admitting: *Deleted

## 2022-08-03 NOTE — Patient Instructions (Addendum)
Visit Information  Thank you for taking time to visit with me today. Please don't hesitate to contact me if I can be of assistance to you.   [name],  Here is the recipe for Diabetic cornbread  Preparation time:10 minutes Baking time:20-25 minutes  Ingredients 1 1/2 cups white cornmeal 1/4 cup all-purpose flour 1/4 teaspoon baking soda 1 teaspoon baking powder 1/2 teaspoon salt 1/4 cup liquid egg substitute 1 teaspoon corn oil 1 cup low-fat buttermilk 1/3 cup water Cooking spray   Directions Yield: 9 servings Serving size: 1/9 of recipe  Preheat oven to 400F. Place an 8-inch cast-iron skillet in the oven to preheat. In a large bowl, stir together cornmeal, flour, baking soda, baking powder, and salt. Add egg substitute, oil, buttermilk, and water, stirring until just moistened. Using oven mitts, remove skillet from oven and coat liberally with cooking spray. (Alternatively, an 8? x 8? baking pan at room temperature, coated with cooking spray, can be used). Pour batter into skillet and bake for 20-25 minutes or until cornbread is golden and cooked through.   Following are the goals we discussed today:   Goals Addressed               This Visit's Progress     Patient Stated     Develop plan of care for sleep apnea ([facility]N) (pt-stated)   Not on track     Care Coordination Interventions: Evaluation of current treatment plan related to obstructive sleep apnea and patient's adherence to plan as established by provider Provided education to patient re: importance of using CPAP for sleep apnea to decrease fatigue, cardiac & respiratory health Discussed plans with patient for ongoing care management follow up and provided patient with direct contact information for care management team Encouraged him to find out the name of his cpap/bipap Education sent via my chart on sleep apnea      Manage diabetes at home [facility]) (pt-stated)   On track     Care Coordination  Interventions: Discussed plans with patient for ongoing care management follow up and provided patient with direct contact information for care management team Discussed carbohydrate intake related to his like of cornbread and pintos . Discussed better choices.  Provided a recipe for a better choice cornbread for patients with diabetes Discussed the importance of reading food labels. Reviewed information for the food label for Jiffy cornbread's carbohydrates, sugar content Sent diabetes education via my chart on meal planning & reading food labels      Manage left side back pain ([facility]N) (pt-stated)   On track     Care Coordination Interventions: Counseled on the importance of reporting any/all new or changed pain symptoms or management strategies to pain management provider Assessed social determinant of health barriers Assessed for any continued back pain He reports previous discussed back pain resolved        Our next appointment is by telephone on 08/24/22 at 10:00  Please call the care guide team at [phone] if you need to cancel or reschedule your appointment.   If you are experiencing a Mental Health or [place] or need someone to talk to, please call the Suicide and Crisis Lifeline: 988 call the [place] National Suicide Prevention Lifeline: (971) 442-5058 or TTY: (732) 416-4446 TTY (959)781-5250) to talk to a trained counselor call 1-800-273-TALK (toll free, 24 hour hotline) call the [facility]: [phone] call 911   Patient verbalizes understanding of instructions and care plan provided today and agrees to view  in [place]. Active MyChart status and patient understanding of how to access instructions and care plan via MyChart confirmed with patient.     The patient has been provided with contact information for the care management team and has been advised to call with any health related questions or concerns.   [place] Lavina Hamman, RN,  BSN, [place] Coordinator Office number [phone]

## 2022-08-03 NOTE — Patient Outreach (Addendum)
  Care Coordination   Follow Up Visit Note   08/03/2022 Name: [name] MRN: [id number] DOB: Jun 27, 1956  [name] is a 66 y.o. year old male who sees [name], DO for primary care. I spoke with  [name] by phone today.  What matters to the patients health and wellness today?  Back pain better He slowed down on hay work. Hypertension- He reports seeing his nephrologist & his pcp nurse after having high readings. He believes his BP device is providing inaccurate reading.  Obstructive sleep apnea (OSA) Confirmed not using in about 4 years, after back surgery CPAP Prefers bipap got from [facility]   Diabetes- "up & down" 140 -200s in morning Love pinto beans, cornbread Discuss jiffy cornbread food label  like fried fat back  Grain bread   Goals Addressed               This Visit's Progress     Patient Stated     Develop plan of care for sleep apnea ([facility]N) (pt-stated)   Not on track     Care Coordination Interventions: Evaluation of current treatment plan related to obstructive sleep apnea and patient's adherence to plan as established by provider Provided education to patient re: importance of using CPAP for sleep apnea to decrease fatigue, cardiac & respiratory health Discussed plans with patient for ongoing care management follow up and provided patient with direct contact information for care management team Encouraged him to find out the name of his cpap/bipap Education sent via my chart on sleep apnea      Manage diabetes at home [facility]) (pt-stated)   On track     Care Coordination Interventions: Discussed plans with patient for ongoing care management follow up and provided patient with direct contact information for care management team Discussed carbohydrate intake related to his like of cornbread and pintos . Discussed better choices.  Provided a recipe for a better choice cornbread for patients with diabetes Discussed the  importance of reading food labels. Reviewed information for the food label for Jiffy cornbread's carbohydrates, sugar content Sent diabetes education via my chart on meal planning & reading food labels      Manage left side back pain (THN) (pt-stated)   On track     Care Coordination Interventions: Counseled on the importance of reporting any/all new or changed pain symptoms or management strategies to pain management provider Assessed social determinant of health barriers Assessed for any continued back pain He reports previous discussed back pain resolved        SDOH assessments and interventions completed:  Yes  SDOH Interventions Today    Flowsheet Row Most Recent Value  SDOH Interventions   Food Insecurity Interventions Intervention Not Indicated  Transportation Interventions Intervention Not Indicated        Care Coordination Interventions Activated:  Yes  Care Coordination Interventions:  Yes, provided   Follow up plan: Follow up call scheduled for 08/24/22 10 am    Encounter Outcome:  Pt. Visit Completed   [place] L. Lavina Hamman, RN, BSN, [place] Coordinator Office number [phone]

## 2022-08-13 ENCOUNTER — Other Ambulatory Visit: Payer: Self-pay | Admitting: Family Medicine

## 2022-08-13 DIAGNOSIS — E1122 Type 2 diabetes mellitus with diabetic chronic kidney disease: Secondary | ICD-10-CM

## 2022-08-24 ENCOUNTER — Ambulatory Visit: Payer: Self-pay | Admitting: *Deleted

## 2022-08-24 NOTE — Patient Instructions (Signed)
Visit Information  Thank you for taking time to visit with me today. Please don't hesitate to contact me if I can be of assistance to you.   Following are the goals we discussed today:   Goals Addressed               This Visit's Progress     Patient Stated     Develop plan of care for sleep apnea (THN) (pt-stated)   Not on track     Care Coordination Interventions: Evaluation of current treatment plan related to obstructive sleep apnea and patient's adherence to plan as established by provider Provided education to patient re: importance of using CPAP for sleep apnea to decrease fatigue, cardiac & respiratory health Discussed plans with patient for ongoing care management follow up and provided patient with direct contact information for care management team Encouraged him again to find out the name of his cpap/bipap Education sent via my chart on sleep apnea      Manage diabetes at home [facility]) (pt-stated)        Care Coordination Interventions: Discussed plans with patient for ongoing care management follow up and provided patient with direct contact information for care management team Discussed carbohydrate intake related to his like of cornbread and pintos . Discussed better choices.  Provided a recipe for a better choice cornbread for patients with diabetes Discussed the importance of reading food labels. Reviewed information for the food label for Jiffy cornbread's carbohydrates, sugar content Requested patient be mailed a blood pressure cuff      Manage left side back pain (THN) (pt-stated)   On track     Care Coordination Interventions: Counseled on the importance of reporting any/all new or changed pain symptoms or management strategies to pain management provider Assessed social determinant of health barriers Assessed for any continued back pain He reports previous discussed back pain resolved        Our next appointment is by telephone on 09/24/22 at 1000  Please  call the care guide team at [phone] if you need to cancel or reschedule your appointment.   If you are experiencing a Mental Health or [place] or need someone to talk to, please call the Suicide and Crisis Lifeline: 988 call the [place] National Suicide Prevention Lifeline: 319-011-2838 or TTY: 5646681480 TTY 863-064-4250) to talk to a trained counselor call 1-800-273-TALK (toll free, 24 hour hotline) call the [facility]: [phone] call 911   Patient verbalizes understanding of instructions and care plan provided today and agrees to view in [place]. Active MyChart status and patient understanding of how to access instructions and care plan via MyChart confirmed with patient.     The patient has been provided with contact information for the care management team and has been advised to call with any health related questions or concerns.   [place] Lavina Hamman, RN, BSN, [place] Coordinator Office number [phone]

## 2022-08-24 NOTE — Patient Outreach (Signed)
  Care Coordination   Follow Up Visit Note   08/24/2022 Name: [name] MRN: [id number] DOB: 02/22/1956  [name] is a 66 y.o. year old male who sees [name], DO for primary care. I spoke with  [name] by phone today.  What matters to the patients health and wellness today?  Follow up on Diabetes, sleep apnea and back pain He reports he is doing " a whole lot better" He reports concern with his son being ill (unsure of diagnosis) Active listening and support provided. He voiced appreciation Denied depression    Goals Addressed               This Visit's Progress     Patient Stated     Develop plan of care for sleep apnea ([facility]) (pt-stated)   Not on track     Care Coordination Interventions: Evaluation of current treatment plan related to obstructive sleep apnea and patient's adherence to plan as established by provider Provided education to patient re: importance of using CPAP for sleep apnea to decrease fatigue, cardiac & respiratory health Discussed plans with patient for ongoing care management follow up and provided patient with direct contact information for care management team Encouraged him again to find out the name of his cpap/bipap Education sent via my chart on sleep apnea      Manage diabetes at home [facility]) (pt-stated)        Care Coordination Interventions: Discussed plans with patient for ongoing care management follow up and provided patient with direct contact information for care management team Discussed carbohydrate intake related to his like of cornbread and pintos . Discussed better choices.  Provided a recipe for a better choice cornbread for patients with diabetes Discussed the importance of reading food labels. Reviewed information for the food label for Jiffy cornbread's carbohydrates, sugar content Requested patient be mailed a blood pressure cuff      Manage left side back pain ([facility]) (pt-stated)   On  track     Care Coordination Interventions: Counseled on the importance of reporting any/all new or changed pain symptoms or management strategies to pain management provider Assessed social determinant of health barriers Assessed for any continued back pain He reports previous discussed back pain resolved        SDOH assessments and interventions completed:  Yes  SDOH Interventions Today    Flowsheet Row Most Recent Value  SDOH Interventions   Utilities Interventions Intervention Not Indicated        Care Coordination Interventions Activated:  Yes  Care Coordination Interventions:  Yes, provided   Follow up plan: Follow up call scheduled for 09/24/22    Encounter Outcome:  Pt. Visit Completed    Paulanthony Gleaves L. Lavina Hamman, RN, BSN, [place] Coordinator Office number [phone]

## 2022-08-27 DIAGNOSIS — E1122 Type 2 diabetes mellitus with diabetic chronic kidney disease: Secondary | ICD-10-CM | POA: Diagnosis not present

## 2022-08-27 DIAGNOSIS — N1832 Chronic kidney disease, stage 3b: Secondary | ICD-10-CM | POA: Diagnosis not present

## 2022-09-03 ENCOUNTER — Telehealth: Payer: Self-pay

## 2022-09-03 NOTE — Telephone Encounter (Signed)
Received notification from [place] regarding approval for [facility]. Patient assistance approved from 11/15/22 to 11/15/23.  Phone: [phone]

## 2022-09-17 ENCOUNTER — Encounter: Payer: Self-pay | Admitting: Family Medicine

## 2022-09-17 ENCOUNTER — Ambulatory Visit (INDEPENDENT_AMBULATORY_CARE_PROVIDER_SITE_OTHER): Payer: PPO | Admitting: Family Medicine

## 2022-09-17 VITALS — BP 134/63 | HR 61 | Temp 97.3°F | Ht 72.0 in | Wt 214.2 lb

## 2022-09-17 DIAGNOSIS — E1159 Type 2 diabetes mellitus with other circulatory complications: Secondary | ICD-10-CM | POA: Diagnosis not present

## 2022-09-17 DIAGNOSIS — R21 Rash and other nonspecific skin eruption: Secondary | ICD-10-CM | POA: Diagnosis not present

## 2022-09-17 DIAGNOSIS — E785 Hyperlipidemia, unspecified: Secondary | ICD-10-CM

## 2022-09-17 DIAGNOSIS — E1169 Type 2 diabetes mellitus with other specified complication: Secondary | ICD-10-CM | POA: Diagnosis not present

## 2022-09-17 DIAGNOSIS — N1832 Chronic kidney disease, stage 3b: Secondary | ICD-10-CM

## 2022-09-17 DIAGNOSIS — I152 Hypertension secondary to endocrine disorders: Secondary | ICD-10-CM | POA: Diagnosis not present

## 2022-09-17 DIAGNOSIS — E1122 Type 2 diabetes mellitus with diabetic chronic kidney disease: Secondary | ICD-10-CM

## 2022-09-17 DIAGNOSIS — N183 Chronic kidney disease, stage 3 unspecified: Secondary | ICD-10-CM | POA: Diagnosis not present

## 2022-09-17 LAB — BAYER DCA HB A1C WAIVED: HB A1C (BAYER DCA - WAIVED): 7.5 % — ABNORMAL HIGH (ref 4.8–5.6)

## 2022-09-17 MED ORDER — FREESTYLE LIBRE 2 READER DEVI: 0 refills | Status: DC

## 2022-09-17 MED ORDER — FREESTYLE LIBRE 2 SENSOR MISC: 4 refills | Status: DC

## 2022-09-17 NOTE — Progress Notes (Signed)
Subjective: CC:DM PCP: [name], DO HUT:[id number] [name] is a 66 y.o. male presenting to clinic today for:  1. Type 2 Diabetes with hypertension, hyperlipidemia:  Compliant with medications.  He denies any hypoglycemic episodes.  Blood sugars typically running around 150s.  [place] not causing any issues.  He denies any dysuria, genital discoloration or pain with urination.  Compliant with insulin therapy but admits that he only checks blood sugar once per day.  Blood pressures run good at his nephrologist office but he does not report checking them at home.  Compliant with losartan 25 mg, Norvasc 10 mg, Coreg 12.5 mg twice daily and Catapres 0.3 mg twice daily.  Also on Lasix daily and Imdur 60 mg for his heart   Last eye exam: Up-to-date Last foot exam: Needs Last A1c:  Lab Results  Component Value Date   HGBA1C 7.6 (H) 06/16/2022   Nephropathy screen indicated?:  Has known renal disease Last flu, zoster and/or pneumovax:  Immunization History  Administered Date(s) Administered   Influenza,inj,Quad PF,6+ Mos 08/25/2017, 09/01/2018   Influenza,inj,quad, With Preservative 09/15/2017, 09/11/2018   Pneumococcal Polysaccharide-23 04/14/2017    ROS: No chest pain.  Occasionally gets short of breath if he is very active.  No dizziness, falls.  Urine output is good.    ROS: Per HPI  No Known Allergies Past Medical History:  Diagnosis Date   Acute combined systolic and diastolic heart failure ([place]) 07/19/2021   Chronic kidney disease    Complication of anesthesia    Hard to wake   Degenerative joint disease (DJD) of lumbar spine    Diabetes mellitus without complication ([place])    Type II   Essential hypertension 02/23/2017   Lumbar stenosis    NSTEMI (non-ST elevated myocardial infarction) ([place]) 06/07/2021    Current Outpatient Medications:    amLODipine (NORVASC) 10 MG tablet, Take 1 tablet (10 mg total) by mouth daily., Disp: 90 tablet, Rfl: 1   aspirin 81 MG  EC tablet, Take 1 tablet (81 mg total) by mouth daily. Swallow whole., Disp: 90 tablet, Rfl: 3   atorvastatin (LIPITOR) 80 MG tablet, Take 1 tablet (80 mg total) by mouth daily., Disp: 90 tablet, Rfl: 1   carvedilol (COREG) 12.5 MG tablet, TAKE 1 TABLET 2 TIMES A DAY WITH A MEAL, Disp: 60 tablet, Rfl: 6   cloNIDine (CATAPRES) 0.3 MG tablet, TAKE (1) TABLET TWICE A DAY for blood pressure, Disp: 180 tablet, Rfl: 3   clopidogrel (PLAVIX) 75 MG tablet, Take 1 tablet (75 mg total) by mouth daily., Disp: 90 tablet, Rfl: 3   dapagliflozin propanediol (FARXIGA) 10 MG TABS tablet, Take 1 tablet (10 mg total) by mouth daily before breakfast., Disp: 90 tablet, Rfl: 5   furosemide (LASIX) 40 MG tablet, TAKE ONE TABLET BY MOUTH EVERY DAY, Disp: 90 tablet, Rfl: 0   gabapentin (NEURONTIN) 100 MG capsule, Take 1 capsule (100 mg total) by mouth 2 (two) times daily., Disp: 180 capsule, Rfl: 3   Insulin Pen Needle (PEN NEEDLES) 31G X 5 MM MISC, Use daily with insulin Dx E11.9, Disp: 100 each, Rfl: 3   isosorbide mononitrate (IMDUR) 60 MG 24 hr tablet, TAKE 1 TABLET DAILY, Disp: 90 tablet, Rfl: 2   TRESIBA FLEXTOUCH 100 UNIT/ML FlexTouch Pen, INJECT 50-60 UNITS INTO SKIN DAILY (INCREASE BY 2 UNITS EVERY OTHER DAY UNTIL SUGAR IS BELOW 150), Disp: 60 mL, Rfl: 0   nitroGLYCERIN (NITROSTAT) 0.4 MG SL tablet, Place 1 tablet (0.4 mg total) under  the tongue every 5 (five) minutes as needed for chest pain. 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 Has another son and one daughter    Raise cattle    Social Determinants of Health   Financial Resource Strain: Low Risk  (07/13/2022)   Overall Financial Resource Strain (CARDIA)    Difficulty of Paying Living Expenses: Not hard at all  Food Insecurity: No Food Insecurity (08/03/2022)   Hunger Vital Sign    Worried About Running Out of Food in the Last Year: Never true    Ran Out of Food in the Last Year: Never true  Transportation Needs: No Transportation Needs (08/03/2022)   PRAPARE - Hydrologist (Medical): No    Lack of Transportation (Non-Medical): No  Physical Activity: Sufficiently Active (07/13/2022)   Exercise Vital Sign    Days of Exercise per Week: 7 days    Minutes of Exercise per Session: 40 min  Stress: No Stress Concern Present (07/13/2022)   [place]    Feeling of Stress : Not at all  Social Connections: Moderately Isolated (03/11/2022)   Social Connection and Isolation Panel [NHANES]    Frequency of Communication with Friends and Family: More than three times a week    Frequency of Social Gatherings with Friends and Family: More than three times a week    Attends Religious Services: Never    Marine scientist or Organizations: No    Attends Archivist Meetings: Never    Marital Status: Married  Human resources officer Violence: Not At Risk (07/13/2022)   Humiliation, Afraid, Rape, and Kick questionnaire    Fear of Current or Ex-Partner: No    Emotionally Abused: No    Physically Abused: No    Sexually Abused: No   Family History  Problem Relation Age of Onset   Heart attack Father 52       Died suddenly   Hypertension Father     Vision loss Sister        one eye   Cancer Brother        skin   Diabetes Brother     Objective: Office vital signs reviewed. BP 134/63   Pulse 61   Temp (!) 97.3 F (36.3 C)   Ht 6' (1.829 m)   Wt 214 lb 3.2 oz (97.2 kg)   SpO2 99%   BMI 29.05 kg/m   Physical Examination:  General: Awake, alert, chronically ill-appearing male, No acute distress HEENT: Droopy eyelids Cardio: regular rate and rhythm, S1S2 heard, no murmurs  appreciated Pulm: clear to auscultation bilaterally, no wheezes, rhonchi or rales; normal work of breathing on room air GI: Protuberant Extremities: Minimally erythematous maculopapular rash noted along the medial aspect of the right lower leg  Assessment/ Plan: 66 y.o. male   Type 2 diabetes mellitus with stage 3b chronic kidney disease, without long-term current use of insulin ([place]) - Plan: Renal function panel, Bayer DCA Hb A1c Waived, Continuous Blood Gluc Sensor (FREESTYLE LIBRE 2 SENSOR) MISC, Continuous Blood Gluc Receiver (FREESTYLE LIBRE 2 READER) DEVI  Hyperlipidemia associated with type 2 diabetes mellitus ([place])  Hypertension associated with diabetes ([place])  Rash and nonspecific skin eruption  I have added freestyle libre given insulin use.  Would like him to increase his blood sugar checks at least 4 times daily.  A1c not at goal at 7.5 today.  Would certainly like him closer to 6.5 given known renal disease.  However, fingersticks have limited him just to once daily due to discomfort.  Complicating features include CKD 3B, for which she is under the care of nephrology.  Check renal function panel.  Plan to advance his insulin further pending what his BGs show.  We will have him see clinical pharmacist in the next 2 to 4 weeks to review blood sugars and adjust insulins.  Continue statin  Blood pressure was controlled upon recheck.  No changes.  Rash on lower extremities responding to topicals.  Orders Placed This Encounter  Procedures    Renal function panel   Bayer DCA Hb A1c Waived   No orders of the defined types were placed in this encounter.    [name], DO [place] [phone]

## 2022-09-18 LAB — RENAL FUNCTION PANEL
Albumin: 4.1 g/dL (ref 3.9–4.9)
BUN/Creatinine Ratio: 13 (ref 10–24)
BUN: 28 mg/dL — ABNORMAL HIGH (ref 8–27)
CO2: 24 mmol/L (ref 20–29)
Calcium: 8.8 mg/dL (ref 8.6–10.2)
Chloride: 102 mmol/L (ref 96–106)
Creatinine, Ser: 2.2 mg/dL — ABNORMAL HIGH (ref 0.76–1.27)
Glucose: 154 mg/dL — ABNORMAL HIGH (ref 70–99)
Phosphorus: 4 mg/dL (ref 2.8–4.1)
Potassium: 3.6 mmol/L (ref 3.5–5.2)
Sodium: 141 mmol/L (ref 134–144)
eGFR: 32 mL/min/{1.73_m2} — ABNORMAL LOW (ref >59)

## 2022-09-23 NOTE — Telephone Encounter (Signed)
Disregard previous note - patient eligible for 2024 re-enrollment.

## 2022-09-24 ENCOUNTER — Ambulatory Visit: Payer: Self-pay | Admitting: *Deleted

## 2022-09-24 NOTE — Patient Outreach (Signed)
  Care Coordination   Follow Up Visit Note   11/11/2022 Name: [name] MRN: [id number] DOB: 05/02/1956  [name] is a 66 y.o. year old male who sees [name], DO for primary care. I spoke with  [name] by phone today.  What matters to the patients health and wellness today?  He reports he is doing better and denies concerns His Son is also doing better  Hypertension Received his new BP cuff but his values are still up and down 70-180  He reports he is aware and has decrease intake of sausages   Diabetes  Rash freestyle libre 2 days ago not received  Eats honey bun and moon pies encouraged moderation  09/17/22 HgA1c was 7.5   Back pain resolved    Goals Addressed               This Visit's Progress     Patient Stated     Manage diabetes at home [facility]) (pt-stated)   On track     Care Coordination Interventions: Discussed plans with patient for ongoing care management follow up and provided patient with direct contact information for care management team Discussed carbohydrate intake related to his like of honey buns, moon pies . Discussed better choices.  content confirms he received the mailed blood pressure cuff      COMPLETED: Manage left side back pain ([facility]) (pt-stated)   On track     Care Coordination Interventions: Counseled on the importance of reporting any/all new or changed pain symptoms or management strategies to pain management provider Assessed social determinant of health barriers Assessed for any continued back pain He reports previous discussed back pain resolved        SDOH assessments and interventions completed:  No     Care Coordination Interventions Activated:  Yes  Care Coordination Interventions:  Yes, provided   Follow up plan: Follow up call scheduled for 10/01/23    Encounter Outcome:  Pt. Visit Completed   Itzel Lowrimore L. Lavina Hamman, RN, BSN, [place] Coordinator Office number [phone]

## 2022-10-05 ENCOUNTER — Ambulatory Visit: Payer: PPO | Admitting: Podiatry

## 2022-10-05 DIAGNOSIS — L97512 Non-pressure chronic ulcer of other part of right foot with fat layer exposed: Secondary | ICD-10-CM

## 2022-10-05 NOTE — Patient Instructions (Signed)
Instructions for Wound Care  The most important step to healing a foot wound is to reduce the pressure on your foot - it is extremely important to stay off your foot as much as possible and wear the shoe/boot as instructed.  Cleanse your foot with saline wash blot dry.  Apply prescribed medication to your wound and cover with gauze and a bandage (Prisma).  Moisten with saline and apply to the ulcer. May hold bandage in place with Coban (self sticky wrap), Ace bandage or tape.  You may find dressing supplies at your local [employer], [employer], drug store or medical supply store.  Monitor for any signs/symptoms of infection. If there is any increase in redness, red streaks, increase in drainage, warmth to your foot please give [place] a call. Also, if you start to run a fever or have "flu-like" symptoms that can also be a sign of infection. Call the office immediately if any occur or go directly to the emergency room.   If you have any questions, please feel free to give [place] a call at [phone] or if you are on "MyChart" you can always send me a message if needed.

## 2022-10-11 ENCOUNTER — Other Ambulatory Visit: Payer: Self-pay | Admitting: Cardiology

## 2022-10-12 NOTE — Progress Notes (Signed)
Subjective: Chief Complaint  Patient presents with   Diabetic Ulcer    Diabetic Ulcer right foot hallux, A1c- 7.1 BG- 114, some drainage, patient denies any pain    66 year old male presents the office with above concerns.  States that he just noticed this as they noticed blood on the floor.  No purulence.  No present swelling or redness.  Not sure how this started.  No fevers or chills today.  No other concerns.  Objective: AAO x3, NAD DP/PT pulses palpable bilaterally, CRT less than 3 seconds Sensation decreased with Semmes Weinstein monofilament. Ulceration present on the plantar aspect of the right hallux measuring 0.5 x 0.5 x 0.4 cm with a vertigo, and or tunneling.  There is no surrounding erythema, ascending cellulitis.  No fluctuation or crepitation.  Neurologic.  No obvious signs of infection noted today. No pain with calf compression, swelling, warmth, erythema    Assessment: Ulceration right hallux  Plan: -All treatment options discussed with the patient including all alternatives, risks, complications.  -I did sharply debride the wound to remove the hyperkeratotic periwound.  Pre and post wound measurements are the same.  I debrided to healthy, granular tissue to remove any nonviable devitalized tissue to promote wound healing.  I irrigated with saline.  No significant blood loss.  Prisma was applied followed by small amount of saline and a dressing.  Discussed every other day dressing changes.  Continue offloading. -Monitor for any clinical signs or symptoms of infection and directed to call the office immediately should any occur or go to the ER. -Patient encouraged to call the office with any questions, concerns, change in symptoms.   X-ray's next appointment if wound still present.  Trula Slade DPM

## 2022-10-22 ENCOUNTER — Other Ambulatory Visit: Payer: PPO

## 2022-10-22 DIAGNOSIS — N19 Unspecified kidney failure: Secondary | ICD-10-CM | POA: Diagnosis not present

## 2022-10-22 DIAGNOSIS — D631 Anemia in chronic kidney disease: Secondary | ICD-10-CM | POA: Diagnosis not present

## 2022-10-22 DIAGNOSIS — R808 Other proteinuria: Secondary | ICD-10-CM | POA: Diagnosis not present

## 2022-10-22 DIAGNOSIS — N189 Chronic kidney disease, unspecified: Secondary | ICD-10-CM | POA: Diagnosis not present

## 2022-10-22 DIAGNOSIS — I129 Hypertensive chronic kidney disease with stage 1 through stage 4 chronic kidney disease, or unspecified chronic kidney disease: Secondary | ICD-10-CM | POA: Diagnosis not present

## 2022-10-22 DIAGNOSIS — E1122 Type 2 diabetes mellitus with diabetic chronic kidney disease: Secondary | ICD-10-CM | POA: Diagnosis not present

## 2022-10-26 ENCOUNTER — Ambulatory Visit (INDEPENDENT_AMBULATORY_CARE_PROVIDER_SITE_OTHER): Payer: PPO

## 2022-10-26 ENCOUNTER — Ambulatory Visit (INDEPENDENT_AMBULATORY_CARE_PROVIDER_SITE_OTHER): Payer: PPO | Admitting: Podiatry

## 2022-10-26 DIAGNOSIS — L97512 Non-pressure chronic ulcer of other part of right foot with fat layer exposed: Secondary | ICD-10-CM | POA: Diagnosis not present

## 2022-10-26 DIAGNOSIS — L97511 Non-pressure chronic ulcer of other part of right foot limited to breakdown of skin: Secondary | ICD-10-CM

## 2022-10-26 DIAGNOSIS — E1149 Type 2 diabetes mellitus with other diabetic neurological complication: Secondary | ICD-10-CM

## 2022-10-29 DIAGNOSIS — D631 Anemia in chronic kidney disease: Secondary | ICD-10-CM | POA: Diagnosis not present

## 2022-10-29 DIAGNOSIS — I129 Hypertensive chronic kidney disease with stage 1 through stage 4 chronic kidney disease, or unspecified chronic kidney disease: Secondary | ICD-10-CM | POA: Diagnosis not present

## 2022-10-29 DIAGNOSIS — R808 Other proteinuria: Secondary | ICD-10-CM | POA: Diagnosis not present

## 2022-10-29 DIAGNOSIS — E1122 Type 2 diabetes mellitus with diabetic chronic kidney disease: Secondary | ICD-10-CM | POA: Diagnosis not present

## 2022-10-29 DIAGNOSIS — N189 Chronic kidney disease, unspecified: Secondary | ICD-10-CM | POA: Diagnosis not present

## 2022-10-29 DIAGNOSIS — N19 Unspecified kidney failure: Secondary | ICD-10-CM | POA: Diagnosis not present

## 2022-10-29 IMAGING — CR DG CHEST 1V
1 series · 1 of 1 positions shown · non-contrast
Comparison: June 07, 2021.

CLINICAL DATA: Chest pain.

EXAM:
CHEST  1 VIEW

[chest ap]
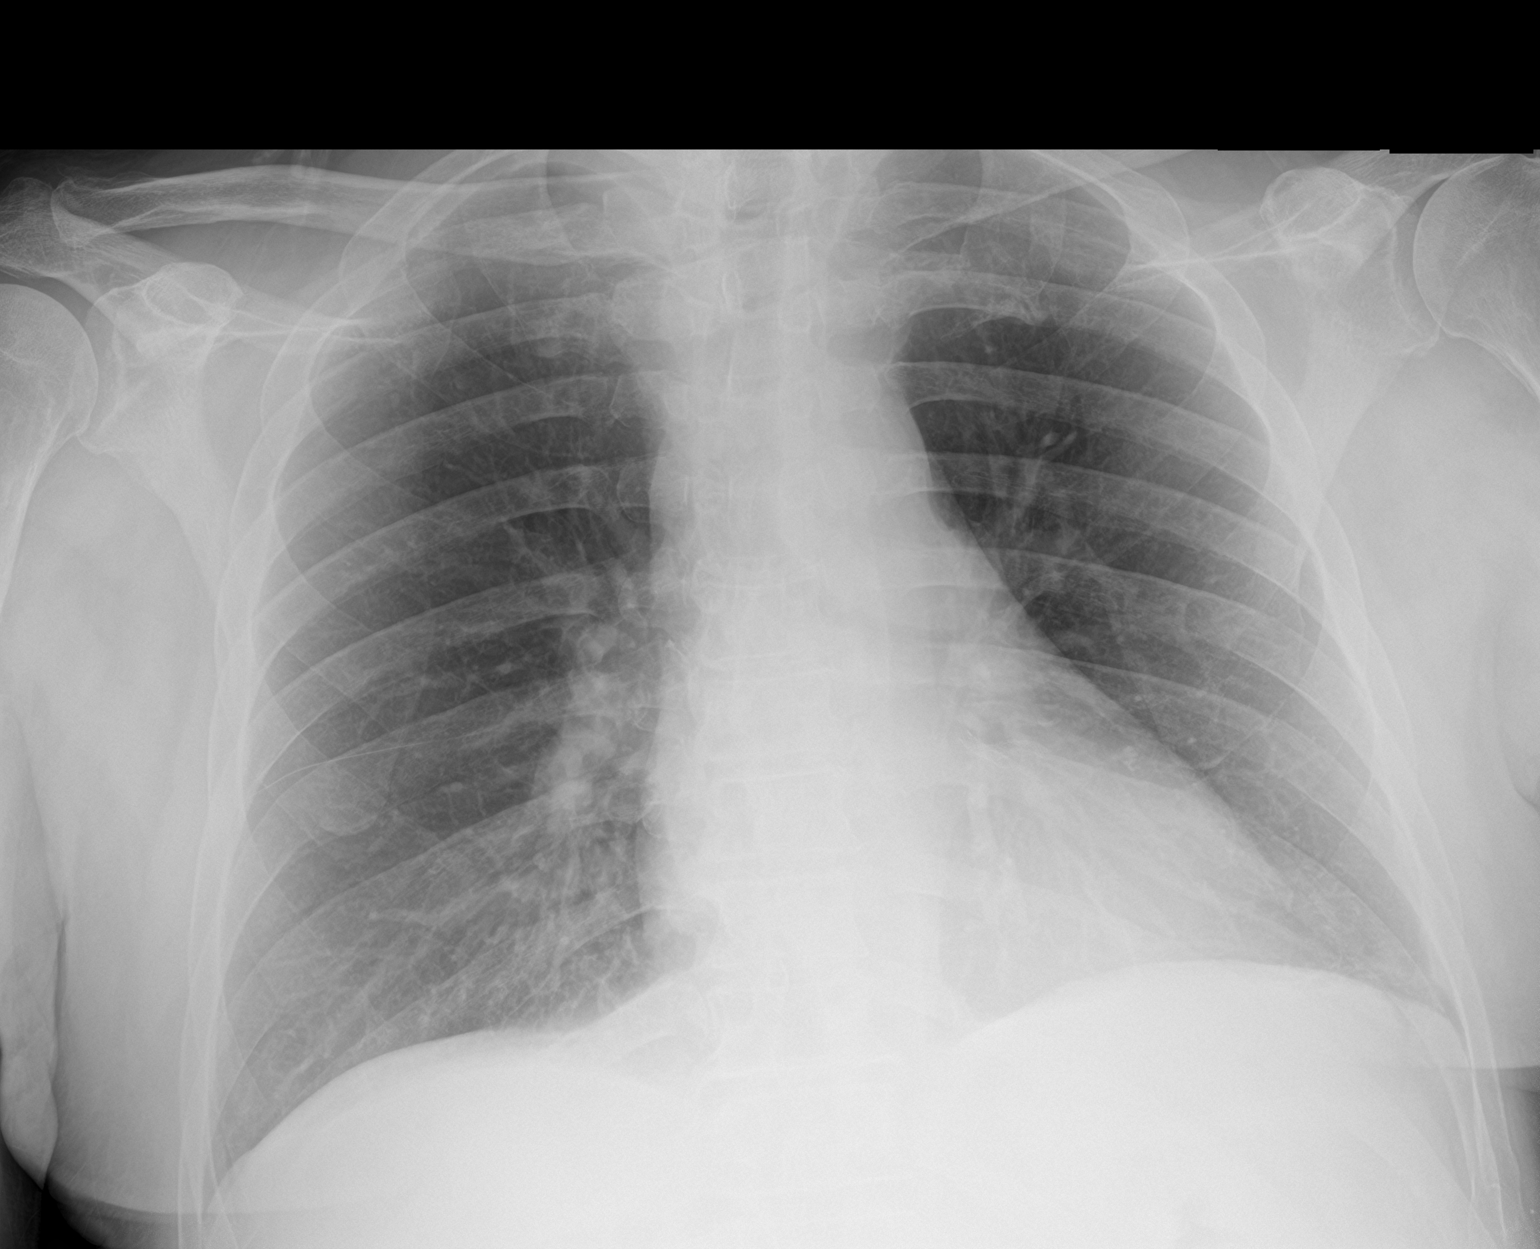

[1 of 1 positions shown; findings below may reference images not displayed]

FINDINGS: The heart size and mediastinal contours are within normal limits.
Both lungs are clear. The visualized skeletal structures are
unremarkable.
IMPRESSION: No active disease.

## 2022-11-02 NOTE — Progress Notes (Signed)
Subjective: Chief Complaint  Patient presents with   Foot Ulcer    66 year old male presents the office with above concerns.  States that overall the wound is doing better.  Denies any drainage or pus.  Still been keeping a bandage on the wound daily.  Denies any fevers or chills today.  He has no other concerns today.   Objective: AAO x3, NAD DP/PT pulses palpable bilaterally, CRT less than 3 seconds Sensation decreased with Semmes Weinstein monofilament. Ulceration present on the plantar aspect of the right hallux measuring 0.3 x 0. x 0.2 cm without any undermining or tunneling.  There is no surrounding erythema, ascending cellulitis.  No fluctuation or crepitation.  Neurologic.  No obvious signs of infection noted today. On the dorsal aspect of the foot small scab is present with some peeling skin.  No drainage or pus.  No fluctuance or crepitation. No pain with calf compression, swelling, warmth, erythema       Assessment: Ulceration right hallux; dorsal foot wound  Plan: -All treatment options discussed with the patient including all alternatives, risks, complications.  -X-rays obtained reviewed of the right foot.  3 views were obtained.  No definitive cortical destruction suggestive of osteomyelitis.  Chronic changes present in the distal lateral aspect of the toe with a well-corticated ossicle. -Medically necessary wound debridement is performed.  I sharply debrided the wound to remove the hyperkeratotic periwound.  Pre and post wound measurements are the same.  I debrided to healthy, granular tissue to remove any nonviable devitalized tissue to promote wound healing.  I irrigated with saline.  No significant blood loss.  Prisma was applied followed by small amount of saline and a dressing.  Discussed every other day dressing changes.  Continue offloading. -Antibiotic ointment to the dorsal foot wound. -Monitor for any clinical signs or symptoms of infection and directed to call  the office immediately should any occur or go to the ER. -Patient encouraged to call the office with any questions, concerns, change in symptoms.   Trula Slade DPM

## 2022-11-04 IMAGING — DX DG CHEST 1V PORT
1 series · 1 of 1 positions shown · non-contrast
Comparison: Chest radiograph dated 07/13/2021.

CLINICAL DATA: Shortness of breath over 6 days.

EXAM:
PORTABLE CHEST 1 VIEW

[chest ap]
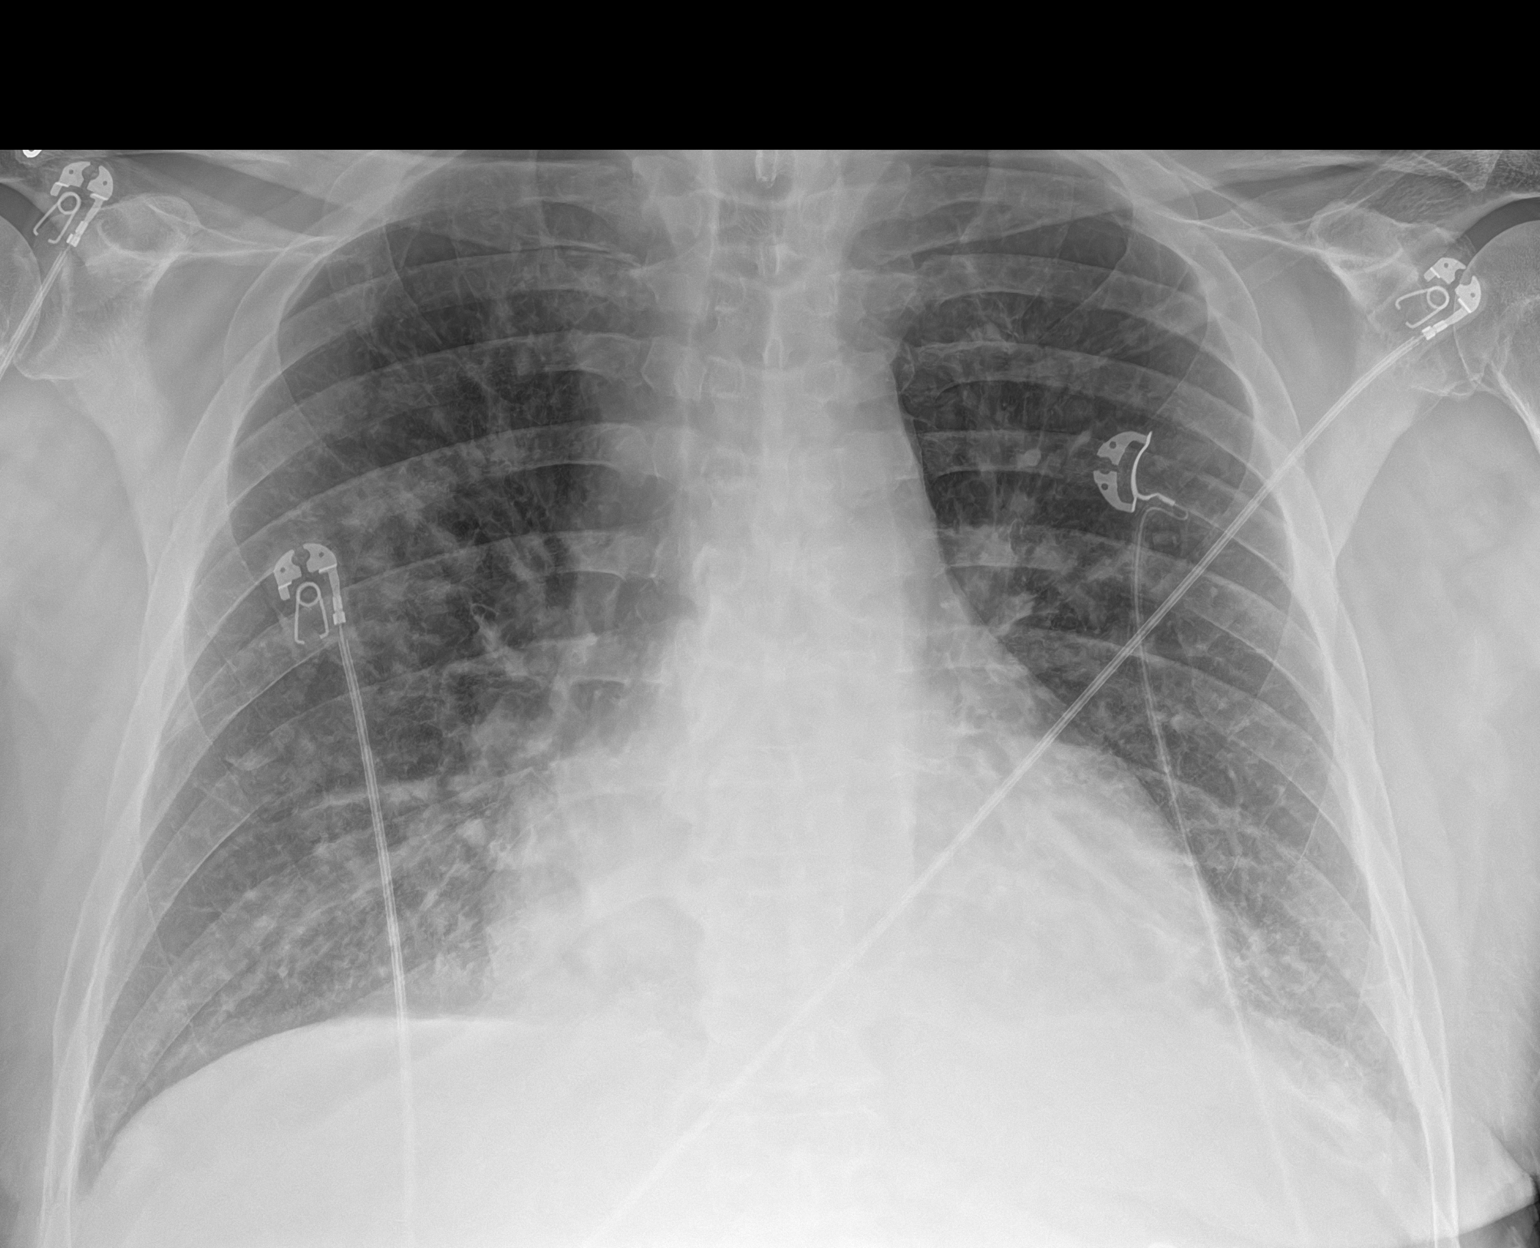

[1 of 1 positions shown; findings below may reference images not displayed]

FINDINGS: The heart size and mediastinal contours are within normal limits.
Mild diffuse bilateral interstitial and airspace opacities are
noted. There is a small left pleural effusion. There is no right
pleural effusion. There is no pneumothorax. The visualized skeletal
structures are unremarkable.
IMPRESSION: Mild diffuse bilateral interstitial and airspace opacities may
represent pulmonary edema or less likely pneumonia. Small left
pleural effusion.

## 2022-11-11 ENCOUNTER — Other Ambulatory Visit: Payer: Self-pay | Admitting: Family Medicine

## 2022-11-11 NOTE — Patient Instructions (Signed)
Visit Information  Thank you for taking time to visit with me today. Please don't hesitate to contact me if I can be of assistance to you.   Following are the goals we discussed today:   Goals Addressed               This Visit's Progress     Patient Stated     Manage diabetes at home [facility]) (pt-stated)   On track     Care Coordination Interventions: Discussed plans with patient for ongoing care management follow up and provided patient with direct contact information for care management team Discussed carbohydrate intake related to his like of honey buns, moon pies . Discussed better choices.  content confirms he received the mailed blood pressure cuff      COMPLETED: Manage left side back pain ([facility]) (pt-stated)   On track     Care Coordination Interventions: Counseled on the importance of reporting any/all new or changed pain symptoms or management strategies to pain management provider Assessed social determinant of health barriers Assessed for any continued back pain He reports previous discussed back pain resolved        Our next appointment is by telephone on 11/30/22 at 3 pm  Please call the care guide team at [phone] if you need to cancel or reschedule your appointment.   If you are experiencing a Mental Health or [place] or need someone to talk to, please call the Suicide and Crisis Lifeline: 988 call the [place] National Suicide Prevention Lifeline: 725-118-4651 or TTY: 413-077-9890 TTY 602-103-9447) to talk to a trained counselor call 1-800-273-TALK (toll free, 24 hour hotline) call the [facility]: [phone]) [phone] call 911   Patient verbalizes understanding of instructions and care plan provided today and agrees to view in [place]. Active MyChart status and patient understanding of how to access instructions and care plan via MyChart confirmed with patient.     The patient has been provided with contact information for the care  management team and has been advised to call with any health related questions or concerns.   Kennidi Yoshida L. Lavina Hamman, RN, BSN, [place] Coordinator Office number [phone]

## 2022-11-15 ENCOUNTER — Encounter: Payer: Self-pay | Admitting: Cardiology

## 2022-11-16 ENCOUNTER — Encounter (INDEPENDENT_AMBULATORY_CARE_PROVIDER_SITE_OTHER): Payer: Medicare HMO | Admitting: Family Medicine

## 2022-11-16 DIAGNOSIS — E1122 Type 2 diabetes mellitus with diabetic chronic kidney disease: Secondary | ICD-10-CM | POA: Diagnosis not present

## 2022-11-16 DIAGNOSIS — N1832 Chronic kidney disease, stage 3b: Secondary | ICD-10-CM

## 2022-11-16 DIAGNOSIS — E8779 Other fluid overload: Secondary | ICD-10-CM

## 2022-11-17 NOTE — Telephone Encounter (Signed)

## 2022-11-18 ENCOUNTER — Other Ambulatory Visit: Payer: Self-pay | Admitting: Family

## 2022-11-18 ENCOUNTER — Other Ambulatory Visit: Payer: Medicare HMO

## 2022-11-18 DIAGNOSIS — E1122 Type 2 diabetes mellitus with diabetic chronic kidney disease: Secondary | ICD-10-CM | POA: Diagnosis not present

## 2022-11-18 DIAGNOSIS — E8779 Other fluid overload: Secondary | ICD-10-CM | POA: Diagnosis not present

## 2022-11-18 DIAGNOSIS — N1832 Chronic kidney disease, stage 3b: Secondary | ICD-10-CM

## 2022-11-18 LAB — BASIC METABOLIC PANEL
BUN/Creatinine Ratio: 16 (ref 10–24)
BUN: 36 mg/dL — ABNORMAL HIGH (ref 8–27)
CO2: 26 mmol/L (ref 20–29)
Calcium: 9.1 mg/dL (ref 8.6–10.2)
Chloride: 101 mmol/L (ref 96–106)
Creatinine, Ser: 2.21 mg/dL — ABNORMAL HIGH (ref 0.76–1.27)
Glucose: 138 mg/dL — ABNORMAL HIGH (ref 70–99)
Potassium: 3.5 mmol/L (ref 3.5–5.2)
Sodium: 141 mmol/L (ref 134–144)
eGFR: 32 mL/min/{1.73_m2} — ABNORMAL LOW (ref >59)

## 2022-11-22 ENCOUNTER — Ambulatory Visit: Payer: Medicare HMO

## 2022-11-22 ENCOUNTER — Encounter: Payer: Self-pay | Admitting: *Deleted

## 2022-11-22 ENCOUNTER — Ambulatory Visit: Payer: Medicare HMO | Admitting: Podiatry

## 2022-11-22 DIAGNOSIS — E1149 Type 2 diabetes mellitus with other diabetic neurological complication: Secondary | ICD-10-CM

## 2022-11-22 DIAGNOSIS — Z89419 Acquired absence of unspecified great toe: Secondary | ICD-10-CM

## 2022-11-22 DIAGNOSIS — M2041 Other hammer toe(s) (acquired), right foot: Secondary | ICD-10-CM | POA: Diagnosis not present

## 2022-11-22 DIAGNOSIS — L97512 Non-pressure chronic ulcer of other part of right foot with fat layer exposed: Secondary | ICD-10-CM

## 2022-11-22 DIAGNOSIS — L84 Corns and callosities: Secondary | ICD-10-CM | POA: Diagnosis not present

## 2022-11-22 MED ORDER — CICLOPIROX 8 % EX SOLN: Freq: Every day | CUTANEOUS | 2 refills | Status: DC

## 2022-11-22 NOTE — Progress Notes (Unsigned)
Patient presents to the office today for diabetic shoe and insole measuring.  Patient was measured with brannock device to determine size and width for 1 pair of extra depth shoes and foam casted for 3 pair of insoles.   ABN signed.   Documentation of medical necessity will be sent to patient's treating diabetic doctor to verify and sign.   Patient's diabetic provider: [name], DO   Shoes and insoles will be ordered at that time and patient will be notified for an appointment for fitting when they arrive.   Patient shoe selection-   1st   Shoe choice:   [id number]  Shoe size ordered: 12 W

## 2022-11-23 ENCOUNTER — Ambulatory Visit: Payer: PPO | Admitting: Podiatry

## 2022-11-23 ENCOUNTER — Ambulatory Visit: Payer: PPO | Admitting: Nurse Practitioner

## 2022-11-25 NOTE — Progress Notes (Signed)
Subjective: Chief Complaint  Patient presents with   Foot Ulcer    Right great hallux, healed pretty well    67 year old male presents the office with above concerns.  States the wound is healed.  No drainage or pus.  No swelling or redness or any new concerns today.     Objective: AAO x3, NAD DP/PT pulses palpable bilaterally, CRT less than 3 seconds Sensation decreased with Semmes Weinstein monofilament. Hyperkeratotic lesion noted on plantar aspect of the right hallux.  The area is preulcerative and upon debridement there is no definitive skin breakdown.  There is no drainage or pus.  No edema, erythema or any signs of infection.  Dorsal foot appears to be old as well.  No other open lesions. No pain with calf compression, swelling, warmth, erythema   Assessment: Ulceration right hallux; dorsal foot wound  Plan: -All treatment options discussed with the patient including all alternatives, risks, complications.  -Sharply debrided the callus for any complications or bleeding on the hallux.  Continue moisturizer, offloading.  Discussed the importance of daily foot inspection. -Diabetic shoes.   Trula Slade DPM

## 2022-11-30 ENCOUNTER — Ambulatory Visit: Payer: Self-pay

## 2022-11-30 NOTE — Patient Instructions (Signed)
Visit Information  Thank you for taking time to visit with me today. Please don't hesitate to contact me if I can be of assistance to you.   Following are the goals we discussed today:   Goals Addressed               This Visit's Progress     Manage CHF at home        Care Coordination Interventions: Provided education on low sodium diet Reviewed Heart Failure Action Plan in depth and provided written copy Advised patient to weigh each morning after emptying bladder Discussed importance of daily weight and advised patient to weigh and record daily Reviewed role of diuretics in prevention of fluid overload and management of heart failure; Discussed the importance of keeping all appointments with provider        Manage diabetes at home [facility]) (pt-stated)        Care Coordination Interventions: Discussed plans with patient for ongoing care management follow up and provided patient with direct contact information for care management team Provided patient with written educational materials related to hypo and hyperglycemia and importance of correct treatment Reviewed to try eating a snack with protein and small amount of CHO at lunch time.  Encouraged to notify provider if he continues to have frequent low readings Reinforced to avoid foods with concentrated sweets       Manage left side back pain (THN) (pt-stated)        Care Coordination Interventions: Discussed importance of adherence to all scheduled medical appointments Counseled on the importance of reporting any/all new or changed pain symptoms or management strategies to pain management provider Reviewed with patient prescribed pharmacological and nonpharmacological pain relief strategies Reviewed to notify provider if back pain does not improve        Our next appointment is by telephone on 01/03/23 at 11:30 AM  Please call the care guide team at [phone] if you need to cancel or reschedule your appointment.   If  you are experiencing a Mental Health or [place] or need someone to talk to, please call the Suicide and Crisis Lifeline: 988 call the [place] National Suicide Prevention Lifeline: 351-865-4321 or TTY: (204) 471-3092 TTY 928-742-2410) to talk to a trained counselor call 1-800-273-TALK (toll free, 24 hour hotline) call the [facility]: (740) [phone] call 911   Patient verbalizes understanding of instructions and care plan provided today and agrees to view in [place]. Active MyChart status and patient understanding of how to access instructions and care plan via MyChart confirmed with patient.     Telephone follow up appointment with care management team member scheduled for:01/03/23  Peter Garter RN, [facility], CDE Care Management Coordinator [place] [facility] [phone]

## 2022-11-30 NOTE — Patient Outreach (Signed)
  Care Coordination   Follow Up Visit Note   11/30/2022 Name: [name] MRN: [id number] DOB: 08-16-1956  [name] is a 67 y.o. year old male who sees [name], DO for primary care. I spoke with  [name] by phone today.  What matters to the patients health and wellness today?  My back has been hurting more for the last 3-4 days States he does not know if he pulled a muscle in his back.  States he is to see his back doctor on 12/08/22.  States his weights have been stable and he has less swelling in his legs since he took the extra Lasix.  Denies any shortness of breath or chest pains today.  States he is wearing the [place] and his sugars range from 67-300 depending on what he eats.  States he has been having more low readings in the afternoon.  States he does not usually eat lungch    Goals Addressed               This Visit's Progress     Manage CHF at home        Care Coordination Interventions: Provided education on low sodium diet Reviewed Heart Failure Action Plan in depth and provided written copy Advised patient to weigh each morning after emptying bladder Discussed importance of daily weight and advised patient to weigh and record daily Reviewed role of diuretics in prevention of fluid overload and management of heart failure; Discussed the importance of keeping all appointments with provider        Manage diabetes at home [facility]) (pt-stated)        Care Coordination Interventions: Discussed plans with patient for ongoing care management follow up and provided patient with direct contact information for care management team Provided patient with written educational materials related to hypo and hyperglycemia and importance of correct treatment Reviewed to try eating a snack with protein and small amount of CHO at lunch time.  Encouraged to notify provider if he continues to have frequent low readings Reinforced to avoid foods with  concentrated sweets       Manage left side back pain (THN) (pt-stated)        Care Coordination Interventions: Discussed importance of adherence to all scheduled medical appointments Counseled on the importance of reporting any/all new or changed pain symptoms or management strategies to pain management provider Reviewed with patient prescribed pharmacological and nonpharmacological pain relief strategies Reviewed to notify provider if back pain does not improve        SDOH assessments and interventions completed:  No     Care Coordination Interventions:  Yes, provided   Follow up plan: Follow up call scheduled for 01/03/23    Encounter Outcome:  Pt. Visit Completed  Peter Garter RN, [place], CDE Care Management Coordinator [place] [facility] [phone]

## 2022-12-05 NOTE — Progress Notes (Unsigned)
Cardiology Office Note   Date:  12/08/2022   ID:  [name], [name] [birth year], MRN [id number]  PCP:  [name], DO  Cardiologist:   Minus Breeding, MD Referring:  [name], DO  Chief Complaint  Patient presents with   Shortness of Breath       History of Present Illness: [name] is a 67 y.o. male who presents for follow up of CAD.  I saw him previously and he had chest pain but a negative perfusions study.  He presented a few weeks after this with chest pain.   He ruled in for NSTEMI and was found to have disease as below and is status post PTCA.  Of note his EF was normal.  He was in the hospital in Sept with acute diastolic HF.  He had another cath and the anatomy was unchanged.    Since I last saw him he had a couple of days of increased shortness of breath.  This was a few weeks ago.  His weight was up a little bit and his legs were swollen.  He was given 2 days of extra 40 mg of Lasix and I reviewed these notes.  He did have his labs followed up because he has renal insufficiency and his creatinine was stable.  He said this 2 days of extra dose took care of things.  He is no longer short of breath.  Has not been having any chest pressure, neck or arm discomfort.  He has not any palpitations, presyncope or syncope.  Of note he has been bothered currently by severe back pain.  He has not been able to take care of his 70,000 his 2 boys are having to help.  He just birthed a cow this morning.  Past Medical History:  Diagnosis Date   Acute combined systolic and diastolic heart failure ([place]) 07/19/2021   Chronic kidney disease    Complication of anesthesia    Hard to wake   Degenerative joint disease (DJD) of lumbar spine    Diabetes mellitus without complication ([place])    Type II   Essential hypertension 02/23/2017   Lumbar stenosis    NSTEMI (non-ST elevated myocardial infarction) ([place]) 06/07/2021    Past Surgical History:  Procedure  Laterality Date   AMPUTATION TOE Right 05/17/2020   Procedure: AMPUTATION TOE partial right great toe;  Surgeon: Evelina Bucy, DPM;  Location: [facility];  Service: Podiatry;  Laterality: Right;   BELPHAROPTOSIS REPAIR     CARDIAC CATHETERIZATION     CORONARY STENT INTERVENTION N/A 06/11/2021   Procedure: CORONARY STENT INTERVENTION;  Surgeon: Martinique, Peter M, MD;  Location: [facility];  Service: Cardiovascular;  Laterality: N/A;   EYE SURGERY     HEMORRHOID SURGERY     INTRAVASCULAR ULTRASOUND/IVUS N/A 06/11/2021   Procedure: Intravascular Ultrasound/IVUS;  Surgeon: Martinique, Peter M, MD;  Location: [place] [facility];  Service: Cardiovascular;  Laterality: N/A;   LEFT HEART CATH AND CORONARY ANGIOGRAPHY N/A 06/09/2021   Procedure: LEFT HEART CATH AND CORONARY ANGIOGRAPHY;  Surgeon: Martinique, Peter M, MD;  Location: [facility];  Service: Cardiovascular;  Laterality: N/A;   LEFT HEART CATH AND CORONARY ANGIOGRAPHY N/A 07/23/2021   Procedure: LEFT HEART CATH AND CORONARY ANGIOGRAPHY;  Surgeon: Lorretta Harp, MD;  Location: [facility];  Service: Cardiovascular;  Laterality: N/A;   skin cancer removed right ear     TRANSFORAMINAL LUMBAR INTERBODY  FUSION (TLIF) WITH PEDICLE SCREW FIXATION 2 LEVEL N/A 07/06/2018   Procedure: TRANSFORAMINAL LUMBAR INTERBODY FUSION (TLIF) L4-S1;  Surgeon: Melina Schools, MD;  Location: [facility];  Service: Orthopedics;  Laterality: N/A;     Current Outpatient Medications  Medication Sig Dispense Refill   amLODipine (NORVASC) 10 MG tablet Take 1 tablet (10 mg total) by mouth daily. 90 tablet 1   aspirin 81 MG EC tablet Take 1 tablet (81 mg total) by mouth daily. Swallow whole. 90 tablet 3   atorvastatin (LIPITOR) 80 MG tablet Take 1 tablet (80 mg total) by mouth daily. 90 tablet 1   carvedilol (COREG) 12.5 MG tablet TAKE 1 TABLET 2 TIMES A DAY WITH A MEAL 60 tablet 6   cloNIDine (CATAPRES) 0.3 MG tablet TAKE (1) TABLET TWICE A DAY for blood pressure  180 tablet 3   clopidogrel (PLAVIX) 75 MG tablet Take 1 tablet (75 mg total) by mouth daily. 90 tablet 3   dapagliflozin propanediol (FARXIGA) 10 MG TABS tablet Take 1 tablet (10 mg total) by mouth daily before breakfast. 90 tablet 5   furosemide (LASIX) 40 MG tablet TAKE ONE TABLET BY MOUTH EVERY DAY 90 tablet 0   gabapentin (NEURONTIN) 100 MG capsule Take 1 capsule (100 mg total) by mouth 2 (two) times daily. 180 capsule 3   isosorbide mononitrate (IMDUR) 60 MG 24 hr tablet TAKE 1 TABLET DAILY 90 tablet 2   losartan (COZAAR) 25 MG tablet Take 25 mg by mouth daily.     nitroGLYCERIN (NITROSTAT) 0.4 MG SL tablet Place 1 tablet (0.4 mg total) under the tongue every 5 (five) minutes as needed for chest pain. 25 tablet 3   TRESIBA FLEXTOUCH 100 UNIT/ML FlexTouch Pen INJECT 50-60 UNITS INTO SKIN DAILY (INCREASE BY 2 UNITS EVERY OTHER DAY UNTIL SUGAR IS BELOW 150) 60 mL 0   ciclopirox (PENLAC) 8 % solution Apply topically at bedtime. Apply over nail and surrounding skin. Apply daily over previous coat. After seven (7) days, may remove with alcohol and continue cycle. (Patient not taking: Reported on 12/08/2022) 6.6 mL 2   Continuous Blood Gluc Receiver (FREESTYLE LIBRE 2 READER) DEVI UAD to monitor sugars continuously. E11.22 1 each 0   Continuous Blood Gluc Sensor (FREESTYLE LIBRE 2 SENSOR) MISC Use as directed to monitor sugars continuously. E11.22 6 each 4   Insulin Pen Needle (PEN NEEDLES) 31G X 5 MM MISC Use daily with insulin Dx E11.9 100 each 3   No current facility-administered medications for this visit.    Allergies:   Patient has no known allergies.    ROS:  Please see the history of present illness.   Otherwise, review of systems are positive for back pain.   All other systems are reviewed and negative.    PHYSICAL EXAM: VS:  BP 100/60   Pulse (!) 54   Ht 6' (1.829 m)   Wt 211 lb (95.7 kg)   BMI 28.62 kg/m  , BMI Body mass index is 28.62 kg/m. GENERAL:  Well appearing NECK:  No  jugular venous distention, waveform within normal limits, carotid upstroke brisk and symmetric, no bruits, no thyromegaly LUNGS:  Clear to auscultation bilaterally CHEST:  Unremarkable HEART:  PMI not displaced or sustained,S1 and S2 within normal limits, no S3, no S4, no clicks, no rubs, no murmurs ABD:  Flat, positive bowel sounds normal in frequency in pitch, no bruits, no rebound, no guarding, no midline pulsatile mass, no hepatomegaly, no splenomegaly EXT:  2 plus pulses throughout,  no edema, no cyanosis no clubbing    Cardiac cath:    Diagnostic Dominance: Right         Staged intervention 06/11/21:     1st Mrg lesion is 95% stenosed.   A drug-eluting stent was successfully placed using a STENT ONYX FRONTIER 2.75X26.   Post intervention, there is a 0% residual stenosis.   Ost Cx to Prox Cx lesion is 65% stenosed.   A drug-eluting stent was successfully placed using a STENT ONYX FRONTIER 3.0X22.   Post intervention, there is a 0% residual stenosis.   Successful Bifurcation stenting of the proximal to mid LCx and first OM using IVUS guidance and [place] technique with DES x 2      Intervention        EKG:  EKG is  ordered today. The ekg ordered 08/04/2019.  Demonstrates sinus rhythm, rate 54, RSR prime V1 and V2, first-degree AV block, left axis deviation.   Recent Labs: 03/12/2022: ALT 15; Hemoglobin 10.4; Platelets 151; TSH 3.270 11/18/2022: BUN 36; Creatinine, Ser 2.21; Potassium 3.5; Sodium 141    Lipid Panel    Component Value Date/Time   CHOL 116 06/16/2022 1042   TRIG 119 06/16/2022 1042   HDL 33 (L) 06/16/2022 1042   CHOLHDL 3.5 06/16/2022 1042   CHOLHDL 4.4 07/21/2021 0348   VLDL 24 07/21/2021 0348   LDLCALC 61 06/16/2022 1042      Wt Readings from Last 3 Encounters:  12/08/22 211 lb (95.7 kg)  09/17/22 214 lb 3.2 oz (97.2 kg)  06/16/22 209 lb 3.2 oz (94.9 kg)      Other studies Reviewed: Additional studies/ records that were reviewed today  include: **. Review of the above records demonstrates:  Please see elsewhere in the note.     ASSESSMENT AND PLAN:  CAD: He is having no symptoms.    Because of the severity of disease I would like to continue him on DAPT.  He has no bleeding issues.     SOB: Given the fact that this resolved quickly and he has had no other associated symptoms and his exam is unchanged and no further testing or change in therapy at this point.  HTN: Blood pressure is at target.  No change in therapy.  SOB: This resolved with a couple of days of diuretic.  No   HLD: LDL was 61 in August.  He can have follow-up this summer.  No change in therapy.    DM type 2: A1C was 7.5 which was down from 8.7.  He is on Jardiance now.    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  He will have this followed by his nephrologist and primary provider.   ANEMIA: Hemoglobin was mildly reduced to 10.4 and I will defer follow-up labs to Ronnie Doss M, DO  SNORING: He has snoring and daytime somnolence.  Years ago he had CPAP.  He would consider pursuing a sleep apnea diagnosis once his back is improved.  Current medicines are reviewed at length with the patient today.  The patient does not have concerns regarding medicines.  The following changes have been made:  As above  Labs/ tests ordered today include: None  Orders Placed This Encounter  Procedures   EKG 12-Lead      Disposition:   FU with me in 6 months.    Signed, Minus Breeding, MD  12/08/2022 9:36 AM    [place]

## 2022-12-08 ENCOUNTER — Ambulatory Visit (INDEPENDENT_AMBULATORY_CARE_PROVIDER_SITE_OTHER): Payer: Medicare HMO | Admitting: Cardiology

## 2022-12-08 ENCOUNTER — Encounter: Payer: Self-pay | Admitting: Cardiology

## 2022-12-08 VITALS — BP 100/60 | HR 54 | Ht 72.0 in | Wt 211.0 lb

## 2022-12-08 DIAGNOSIS — M5451 Vertebrogenic low back pain: Secondary | ICD-10-CM | POA: Diagnosis not present

## 2022-12-08 DIAGNOSIS — E1165 Type 2 diabetes mellitus with hyperglycemia: Secondary | ICD-10-CM

## 2022-12-08 DIAGNOSIS — Z794 Long term (current) use of insulin: Secondary | ICD-10-CM | POA: Diagnosis not present

## 2022-12-08 DIAGNOSIS — N1832 Chronic kidney disease, stage 3b: Secondary | ICD-10-CM

## 2022-12-08 DIAGNOSIS — E785 Hyperlipidemia, unspecified: Secondary | ICD-10-CM | POA: Diagnosis not present

## 2022-12-08 DIAGNOSIS — I2510 Atherosclerotic heart disease of native coronary artery without angina pectoris: Secondary | ICD-10-CM

## 2022-12-08 DIAGNOSIS — I10 Essential (primary) hypertension: Secondary | ICD-10-CM | POA: Diagnosis not present

## 2022-12-08 NOTE — Patient Instructions (Signed)
Medication Instructions:  The current medical regimen is effective;  continue present plan and medications.  *If you need a refill on your cardiac medications before your next appointment, please call your pharmacy*  Follow-Up: At [facility], you and your health needs are our priority.  As part of our continuing mission to provide you with exceptional heart care, we have created designated Provider Care Teams.  These Care Teams include your primary Cardiologist (physician) and Advanced Practice Providers (APPs -  Physician Assistants and Nurse Practitioners) who all work together to provide you with the care you need, when you need it.  We recommend signing up for the patient portal called "MyChart".  Sign up information is provided on this After Visit Summary.  MyChart is used to connect with patients for Virtual Visits (Telemedicine).  Patients are able to view lab/test results, encounter notes, upcoming appointments, etc.  Non-urgent messages can be sent to your provider as well.   To learn more about what you can do with MyChart, go to [email or web address].    Your next appointment:   6 month(s)  Provider:   Minus Breeding, MD

## 2022-12-10 ENCOUNTER — Other Ambulatory Visit: Payer: Self-pay | Admitting: Cardiology

## 2022-12-10 ENCOUNTER — Other Ambulatory Visit: Payer: Self-pay | Admitting: Family Medicine

## 2022-12-10 DIAGNOSIS — E1122 Type 2 diabetes mellitus with diabetic chronic kidney disease: Secondary | ICD-10-CM

## 2022-12-22 ENCOUNTER — Ambulatory Visit (INDEPENDENT_AMBULATORY_CARE_PROVIDER_SITE_OTHER): Payer: Medicare HMO

## 2022-12-22 DIAGNOSIS — L84 Corns and callosities: Secondary | ICD-10-CM

## 2022-12-22 DIAGNOSIS — Z89419 Acquired absence of unspecified great toe: Secondary | ICD-10-CM

## 2022-12-22 DIAGNOSIS — E1149 Type 2 diabetes mellitus with other diabetic neurological complication: Secondary | ICD-10-CM | POA: Diagnosis not present

## 2022-12-22 DIAGNOSIS — M2041 Other hammer toe(s) (acquired), right foot: Secondary | ICD-10-CM

## 2022-12-22 NOTE — Progress Notes (Signed)
Patient presents today to pick up diabetic shoes and insoles.  Patient was dispensed 1 pair of diabetic shoes and 3 pairs of foam casted diabetic insoles. Fit was satisfactory. Instructions for break-in and wear was reviewed and a copy was given to the patient.   Re-appointment for regularly scheduled diabetic foot care visits or if they should experience any trouble with the shoes or insoles.  

## 2022-12-28 ENCOUNTER — Ambulatory Visit: Payer: Medicare HMO | Admitting: Family Medicine

## 2022-12-30 DIAGNOSIS — M5416 Radiculopathy, lumbar region: Secondary | ICD-10-CM | POA: Diagnosis not present

## 2022-12-31 ENCOUNTER — Other Ambulatory Visit: Payer: Self-pay | Admitting: Cardiology

## 2023-01-03 ENCOUNTER — Ambulatory Visit: Payer: Self-pay | Admitting: *Deleted

## 2023-01-03 NOTE — Patient Outreach (Addendum)
  Care Coordination   Follow Up Visit Note   01/03/2023 Name: [name] MRN: [id number] DOB: [birth year]  [name] is a 67 y.o. year old male who sees [name], DO for primary care. I spoke with  [name] by phone today.  What matters to the patients health and wellness today?  Now having unresolved back pain, hx of back surgery,  recent xray within normal limits, steroid shot only helped a few  Back pain is triggered by bending over to tie left shoe, standing up from sitting in a chair, turning "the wrong way in bed" Now unable to get up out of bed He reports visiting his friend, standing up with shooting pain up his back that caused him to have to stay in bed for "around three days"  Does not like to take pain medication - has only taken Tylenol when needed   Sees Dr Rolena Infante on 01/04/23 at 1 pm  He has received his diabetic shoes Has not been able nor attempted to wear per patient  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 Can not get in his tractor  He voices concern about not having conservative treatment plans Prefers a more aggressive treatment plan  He reports change of insurance to [place] team advantage for 2024   Goals Addressed               This Visit's Progress     Patient Stated     Manage diabetes at home [facility]) (pt-stated)   On track     Care Coordination Interventions: Discussed plans with patient for ongoing care management follow up and provided patient with direct contact information for care management team Confirmed cbg values have improved      Manage left side back pain (THN) (pt-stated)   Not on track     Care Coordination Interventions: Reviewed provider established plan for pain management Discussed importance of adherence to all scheduled medical appointments Advised patient to report to care team affect of pain on daily activities Discussed use of  relaxation techniques and/or diversional activities to assist with pain reduction (distraction, imagery, relaxation, massage, acupressure, TENS, heat, and cold application Interventions Today    Flowsheet Row Most Recent Value  Chronic Disease   Chronic disease during today's visit Other  [lower back pain]  General Interventions   General Interventions Discussed/Reviewed Doctor Visits, Communication with  Doctor Visits Discussed/Reviewed Doctor Visits Discussed, Specialist  Durable Medical Equipment (DME) Glucomoter, BP Cuff, Other  [diabetic shoes - Encouraged him to try using them to see if it helps with back pressure relief]  PCP/Specialist Visits Compliance with follow-up visit  Communication with RN  [left a message for Dr Rolena Infante Nurse with intention to share pt voiced concerns about treatment plans]  Exercise Interventions   Exercise Discussed/Reviewed Exercise Discussed, Physical Activity  Physical Activity Discussed/Reviewed Physical Activity Discussed  [ability to be active at home - minimal]            SDOH assessments and interventions completed:  No     Care Coordination Interventions:  Yes, provided   Follow up plan: Follow up call scheduled for 02/01/23    Encounter Outcome:  Pt. Visit Completed   Nanie Dunkleberger L. Lavina Hamman, RN, BSN, [place] Coordinator Office number [phone]

## 2023-01-03 NOTE — Patient Instructions (Addendum)
Visit Information  Thank you for taking time to visit with me today. Please don't hesitate to contact me if I can be of assistance to you.   Following are the goals we discussed today:   Goals Addressed               This Visit's Progress     Patient Stated     Manage diabetes at home [facility]) (pt-stated)   On track     Care Coordination Interventions: Discussed plans with patient for ongoing care management follow up and provided patient with direct contact information for care management team Confirmed cbg values have improved      Manage left side back pain (THN) (pt-stated)   Not on track     Care Coordination Interventions: Reviewed provider established plan for pain management Discussed importance of adherence to all scheduled medical appointments Advised patient to report to care team affect of pain on daily activities Discussed use of relaxation techniques and/or diversional activities to assist with pain reduction (distraction, imagery, relaxation, massage, acupressure, TENS, heat, and cold application Interventions Today    Flowsheet Row Most Recent Value  Chronic Disease   Chronic disease during today's visit Other  [lower back pain]  General Interventions   General Interventions Discussed/Reviewed Doctor Visits, Communication with  Doctor Visits Discussed/Reviewed Doctor Visits Discussed, Specialist  Durable Medical Equipment (DME) Glucomoter, BP Cuff, Other  [diabetic shoes - Encouraged him to try using them to see if it helps with back pressure relief]  PCP/Specialist Visits Compliance with follow-up visit  Communication with RN  [left a message for Dr Rolena Infante Nurse with intention to share pt voiced concerns about treatment plans]  Exercise Interventions   Exercise Discussed/Reviewed Exercise Discussed, Physical Activity  Physical Activity Discussed/Reviewed Physical Activity Discussed  [ability to be active at home - minimal]            Our next appointment  is by telephone on 02/01/23 at 1000  Please call the care guide team at [phone] if you need to cancel or reschedule your appointment.   If you are experiencing a Mental Health or [place] or need someone to talk to, please call the Suicide and Crisis Lifeline: 988 call the [place] National Suicide Prevention Lifeline: 539-030-0921 or TTY: 305-855-6724 TTY (909) 156-2114) to talk to a trained counselor call 1-800-273-TALK (toll free, 24 hour hotline) call the [facility]: [phone] call 911   Patient verbalizes understanding of instructions and care plan provided today and agrees to view in [place]. Active MyChart status and patient understanding of how to access instructions and care plan via MyChart confirmed with patient.     The patient has been provided with contact information for the care management team and has been advised to call with any health related questions or concerns.    Osborne Serio L. Lavina Hamman, RN, BSN, [place] Coordinator Office number [phone]

## 2023-01-04 ENCOUNTER — Ambulatory Visit: Payer: Self-pay | Admitting: *Deleted

## 2023-01-04 DIAGNOSIS — M5459 Other low back pain: Secondary | ICD-10-CM | POA: Diagnosis not present

## 2023-01-04 NOTE — Patient Outreach (Signed)
  Care Coordination   Collaboration with [facility]  Visit Note   01/04/2023 Name: [name] MRN: [id number] DOB: May 17, 1956  [name] is a 67 y.o. year old male who sees [name], DO for primary care. I  received a message and returned a call to [place], [place] [facility]. Message left for her    What matters to the patients health and wellness today?  [facility] collaboration related to back pain treatment plan    Goals Addressed               This Visit's Progress     Patient Stated     Manage left side back pain (THN) (pt-stated)        Care Coordination Interventions: Interventions Today    Flowsheet Row Most Recent Value  Chronic Disease   Chronic disease during today's visit Other  [back pain]  General Interventions   General Interventions Discussed/Reviewed General Interventions Reviewed, Communication with  Communication with RN, PCP/Specialists  [Received a message and returned a call to leave a message for [employer] RN, related to Mr Swiggum voiced concerns about wanting more aggressive treatment plan 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 Visit Completed   Jenica Costilow L. Lavina Hamman, RN, BSN, [place] Coordinator Office number [phone]

## 2023-01-04 NOTE — Patient Instructions (Signed)
Visit Information  Thank you for taking time to visit with me today. Please don't hesitate to contact me if I can be of assistance to you.   Following are the goals we discussed today:   Goals Addressed               This Visit's Progress     Patient Stated     Manage left side back pain (THN) (pt-stated)        Care Coordination Interventions: Interventions Today    Flowsheet Row Most Recent Value  Chronic Disease   Chronic disease during today's visit Other  [back pain]  General Interventions   General Interventions Discussed/Reviewed General Interventions Reviewed, Communication with  Communication with RN, PCP/Specialists  [Received a message and returned a call to leave a message for [employer] RN, related to Mr Ela voiced concerns about wanting more aggressive treatment plan to 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   If you are experiencing a Mental Health or [place] or need someone to talk to, please call the Suicide and Crisis Lifeline: 988 call the [place] National Suicide Prevention Lifeline: (236) 434-6278 or TTY: (548) 014-5272 TTY (442)602-3984) to talk to a trained counselor call 1-800-273-TALK (toll free, 24 hour hotline) call the [facility]: [phone] call 911   Patient verbalizes understanding of instructions and care plan provided today and agrees to view in [employer]. Active MyChart status and patient understanding of how to access instructions and care plan via MyChart confirmed with patient.     The patient has been provided with contact information for the care management team and has been advised to call with any health related questions or concerns.    Teghan Philbin L. Lavina Hamman, RN, BSN, [employer]  Coordinator Office number [phone]

## 2023-01-11 ENCOUNTER — Other Ambulatory Visit: Payer: Self-pay

## 2023-01-11 ENCOUNTER — Encounter: Payer: Self-pay | Admitting: Physical Therapy

## 2023-01-11 ENCOUNTER — Ambulatory Visit: Payer: Medicare HMO | Attending: Orthopedic Surgery | Admitting: Physical Therapy

## 2023-01-11 DIAGNOSIS — M62830 Muscle spasm of back: Secondary | ICD-10-CM | POA: Diagnosis not present

## 2023-01-11 DIAGNOSIS — R293 Abnormal posture: Secondary | ICD-10-CM | POA: Diagnosis not present

## 2023-01-11 DIAGNOSIS — M5459 Other low back pain: Secondary | ICD-10-CM | POA: Diagnosis not present

## 2023-01-11 NOTE — Therapy (Addendum)
OUTPATIENT PHYSICAL THERAPY THORACOLUMBAR EVALUATION   Patient Name: [name] MRN: [id number] DOB:1956-05-20, 67 y.o., male Today's Date: 01/11/2023  END OF SESSION:  PT End of Session - 01/11/23 1315     Visit Number 1    Number of Visits 8    Date for PT Re-Evaluation 02/08/23    Authorization Type FOTO AT LEAST EVERY 5TH VISIT.  PROGRESS NOTE AT 10TH VISIT.  KX MODIFIER AFTER 15 VISITS.    PT Start Time 0101    PT Stop Time 0141    PT Time Calculation (min) 40 min    Activity Tolerance Patient tolerated treatment well    Behavior During Therapy [facility] for tasks assessed/performed             Past Medical History:  Diagnosis Date   Acute combined systolic and diastolic heart failure ([place]) 07/19/2021   Chronic kidney disease    Complication of anesthesia    Hard to wake   Degenerative joint disease (DJD) of lumbar spine    Diabetes mellitus without complication ([place])    Type II   Essential hypertension 02/23/2017   Lumbar stenosis    NSTEMI (non-ST elevated myocardial infarction) ([place]) 06/07/2021   Past Surgical History:  Procedure Laterality Date   AMPUTATION TOE Right 05/17/2020   Procedure: AMPUTATION TOE partial right great toe;  Surgeon: Evelina Bucy, DPM;  [facility]: [facility];  Service: Podiatry;  Laterality: Right;   BELPHAROPTOSIS REPAIR     CARDIAC CATHETERIZATION     CORONARY STENT INTERVENTION N/A 06/11/2021   Procedure: CORONARY STENT INTERVENTION;  Surgeon: Martinique, Peter M, MD;  Location: [place] [facility];  Service: Cardiovascular;  Laterality: N/A;   EYE SURGERY     HEMORRHOID SURGERY     INTRAVASCULAR ULTRASOUND/IVUS N/A 06/11/2021   Procedure: Intravascular Ultrasound/IVUS;  Surgeon: Martinique, Peter M, MD;  Location: [place] [facility];  Service: Cardiovascular;  Laterality: N/A;   LEFT HEART CATH AND CORONARY ANGIOGRAPHY N/A 06/09/2021   Procedure: LEFT HEART CATH AND CORONARY ANGIOGRAPHY;  Surgeon: Martinique, Peter M, MD;  Location: [place] [facility];  Service: Cardiovascular;  Laterality: N/A;   LEFT HEART CATH AND CORONARY ANGIOGRAPHY N/A 07/23/2021   Procedure: LEFT HEART CATH AND CORONARY ANGIOGRAPHY;  Surgeon: Lorretta Harp, MD;  Location: [place] [facility];  Service: Cardiovascular;  Laterality: N/A;   skin cancer removed right ear     TRANSFORAMINAL LUMBAR INTERBODY FUSION (TLIF) WITH PEDICLE SCREW FIXATION 2 LEVEL N/A 07/06/2018   Procedure: TRANSFORAMINAL LUMBAR INTERBODY FUSION (TLIF) L4-S1;  Surgeon: Melina Schools, MD;  Location: [facility];  Service: Orthopedics;  Laterality: N/A;   Patient Active Problem List   Diagnosis Date Noted   Morbid obesity ([place]) 08/19/2021   Coronary artery disease involving native coronary artery of native heart with angina pectoris (HCC)    Congestive heart failure (CHF) ([place]) 07/19/2021   CAD, multiple vessel 06/25/2021   DM (diabetes mellitus) type II, controlled, with peripheral vascular disorder (HCC)    Chronic kidney disease (CKD) stage G3b/A2, moderately decreased glomerular filtration rate (GFR) between 30-44 mL/min/1.73 square meter and albuminuria creatinine ratio between 30-299 mg/g (HCC)    Unstable angina ([place]) 06/07/2021   Personal history of diabetic foot ulcer 06/18/2020   History of amputation of great toe ([place]) 06/18/2020   Osteomyelitis of great toe of right foot ([place])    Diabetic ulcer of toe of right foot associated with diabetes mellitus due to underlying condition, with necrosis of  bone ([place])    Diabetic infection of right foot ([place])    Diabetic ulcer of toe of right foot associated with type 2 diabetes mellitus, with fat layer exposed ([place]) 04/15/2020   OSA on CPAP 09/01/2018   S/P lumbar fusion 07/06/2018   DDD (degenerative disc disease), lumbar 02/28/2018   Hypertension associated with diabetes ([place]) 02/23/2017   Benign prostatic hyperplasia with incomplete bladder emptying 02/23/2017   Vitamin D deficiency 02/23/2017   Hyperlipidemia associated with type 2 diabetes  mellitus ([place]) 02/23/2017   Type 2 diabetes mellitus with stage 2 chronic kidney disease, without long-term current use of insulin ([place]) 02/23/2017   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   [facility] [facility] [place], [place], [address] Phone: [phone]   Fax:  [phone]  Physical Therapy Certification  Patient Details Name: [name] MRN: [id number] Date of Birth: [id number]  Certification Start Date: [id number] Certification End Date: 02/08/23  Encounter Date: 01/11/2023   REFERRING PROVIDER: Melina Schools MD  REFERRING DIAG: Low back pain.  Rationale for Evaluation and Treatment: Rehabilitation  THERAPY DIAG: Other low back pain  Muscle spasm of back  Abnormal posture  ONSET DATE: "Around January" of this year.  SUBJECTIVE:                                                                                                                                                                                          SUBJECTIVE STATEMENT: The patient presents to the clinic with c/o low back pain.  He had a TLIF on 07/06/18. He states he has done very well since surgery but around January of this year he states that his pain began to rise and especially when bending forward it feels like his back is "exploding."  His pain is rated at a 6/10 today.  Rest decreases his pain.    PERTINENT HISTORY: DM, TLIF (07/06/18).  PAIN: Are you having pain? Yes: NPRS scale: 6/10 Pain location: Low back. Pain description: Dull ache. Aggravating factors: Bending. Relieving factors: Rest.  PRECAUTIONS: None  WEIGHT BEARING RESTRICTIONS: No  FALLS: Has patient fallen in last 6 months? No  LIVING ENVIRONMENT: Lives with: lives with their spouse Lives in: House/apartment Has following equipment at home: None  PLOF: Independent  PATIENT GOALS: Decrease pain especially with bending.  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    OBJECTIVE:  PATIENT SURVEYS: FOTO 40.   POSTURE: rounded shoulders, forward head, decreased lumbar lordosis, and flexed trunk .  He stand sin 10 degrees of flexion.  PALPATION: C/o diffuse bilateral lumbar musculature pain and which  is very taut to palpation.  LUMBAR ROM: Flexion decreased by 50% and extension to upright position.  LOWER EXTREMITY ROM:   In supine:  right hip flexion to 90 degrees and left to 95 degrees.,  LOWER EXTREMITY MMT:   Normal bilateral LE strength.  LUMBAR SPECIAL TESTS: Equal leg lengths.  TRANSFERS:  Sit to supine to sit performed slowly with difficulty.   GAIT: Slow and purposeful with a wide base of support with a decrease in step and stride length.  TODAY'S TREATMENT:                                                                                                                             DATE: HMP and IFC at 80-150 Hz on 40% scan x 15 minutes to patient's bilateral lumbar musculature.   Patient tolerated treatment without complaint with normal modality response following removal of modality.   ASSESSMENT:  CLINICAL IMPRESSION: The patient presents to [facility] with c/o bilateral and diffuse low back pain.  He had a TLIF on 07/06/18.  He states he has done well but in January he began to experience increased pain especially when bending.  His lumbar musculature is diffusely tender bilaterally and very taut to palpation.  His posture is remarkable for standing in a flexed trunk posture and subsequent loss of lordosis.  He moves slowly and purposefully and transitory movements (sit to supine to sit) were performed with difficulty.  Patient will benefit from skilled physical therapy intervention to address pain and deficits.  OBJECTIVE IMPAIRMENTS: Abnormal gait, decreased activity tolerance, decreased mobility, decreased ROM, increased muscle spasms, postural dysfunction, and pain.  ACTIVITY LIMITATIONS: carrying, lifting,  and bending  PARTICIPATION LIMITATIONS: occupation and yard work  PERSONAL FACTORS: 1 comorbidity: lumbar fusion  are also affecting patient's functional outcome.  REHAB POTENTIAL: Good  CLINICAL DECISION MAKING: Stable/uncomplicated  EVALUATION COMPLEXITY: Low   GOALS: LONG TERM GOALS: Target date: 02/08/23.  1. Ind with an HEP. Goal status: INITIAL  2.  Perform ADL's with pain not > 3/10. Goal status: INITIAL  3.  Lift using correct body mechanics with pain not > a 3-4/10. Goal status: INITIAL PLAN:  PT FREQUENCY: 2x/week  PT DURATION: 4 weeks  PLANNED INTERVENTIONS: Therapeutic exercises, Therapeutic activity, Patient/Family education, Self Care, Dry Needling, Electrical stimulation, Cryotherapy, Moist heat, Ultrasound, and Manual therapy.  PLAN FOR NEXT SESSION: STW/M, dry needling, spinal protection techniques and body mechanics training.   Annaliyah Willig, [place], PT 01/11/2023, 2:36 PM

## 2023-01-17 ENCOUNTER — Encounter: Payer: Self-pay | Admitting: Physical Therapy

## 2023-01-17 ENCOUNTER — Ambulatory Visit: Payer: Medicare HMO | Attending: Orthopedic Surgery | Admitting: Physical Therapy

## 2023-01-17 DIAGNOSIS — M62830 Muscle spasm of back: Secondary | ICD-10-CM | POA: Diagnosis not present

## 2023-01-17 DIAGNOSIS — M5459 Other low back pain: Secondary | ICD-10-CM | POA: Diagnosis not present

## 2023-01-17 DIAGNOSIS — R293 Abnormal posture: Secondary | ICD-10-CM | POA: Insufficient documentation

## 2023-01-17 NOTE — Therapy (Signed)
OUTPATIENT PHYSICAL THERAPY THORACOLUMBAR TREATMENT   Patient Name: [name] MRN: [id number] DOB:10-13-1956, 67 y.o., male Today's Date: 01/17/2023  END OF SESSION:  PT End of Session - 01/17/23 1115     Visit Number 2    Number of Visits 8    Date for PT Re-Evaluation 02/08/23    Authorization Type FOTO AT LEAST EVERY 5TH VISIT.  PROGRESS NOTE AT 10TH VISIT.  KX MODIFIER AFTER 15 VISITS.    PT Start Time 1116    PT Stop Time 1200    PT Time Calculation (min) 44 min    Activity Tolerance Patient tolerated treatment well    Behavior During Therapy [facility] for tasks assessed/performed            Past Medical History:  Diagnosis Date   Acute combined systolic and diastolic heart failure ([place]) 07/19/2021   Chronic kidney disease    Complication of anesthesia    Hard to wake   Degenerative joint disease (DJD) of lumbar spine    Diabetes mellitus without complication ([place])    Type II   Essential hypertension 02/23/2017   Lumbar stenosis    NSTEMI (non-ST elevated myocardial infarction) ([place]) 06/07/2021   Past Surgical History:  Procedure Laterality Date   AMPUTATION TOE Right 05/17/2020   Procedure: AMPUTATION TOE partial right great toe;  Surgeon: Evelina Bucy, DPM;  [facility];  Service: Podiatry;  Laterality: Right;   BELPHAROPTOSIS REPAIR     CARDIAC CATHETERIZATION     CORONARY STENT INTERVENTION N/A 06/11/2021   Procedure: CORONARY STENT INTERVENTION;  Surgeon: Martinique, Peter M, MD;  Location: [place] [facility];  Service: Cardiovascular;  Laterality: N/A;   EYE SURGERY     HEMORRHOID SURGERY     INTRAVASCULAR ULTRASOUND/IVUS N/A 06/11/2021   Procedure: Intravascular Ultrasound/IVUS;  Surgeon: Martinique, Peter M, MD;  Location: [name];  Service: Cardiovascular;  Laterality: N/A;   LEFT HEART CATH AND CORONARY ANGIOGRAPHY N/A 06/09/2021   Procedure: LEFT HEART CATH AND CORONARY ANGIOGRAPHY;  Surgeon: Martinique, Peter M, MD;  Location: [place] [facility];   Service: Cardiovascular;  Laterality: N/A;   LEFT HEART CATH AND CORONARY ANGIOGRAPHY N/A 07/23/2021   Procedure: LEFT HEART CATH AND CORONARY ANGIOGRAPHY;  Surgeon: Lorretta Harp, MD;  Location: [place];  Service: Cardiovascular;  Laterality: N/A;   skin cancer removed right ear     TRANSFORAMINAL LUMBAR INTERBODY FUSION (TLIF) WITH PEDICLE SCREW FIXATION 2 LEVEL N/A 07/06/2018   Procedure: TRANSFORAMINAL LUMBAR INTERBODY FUSION (TLIF) L4-S1;  Surgeon: Melina Schools, MD;  Location: [facility];  Service: Orthopedics;  Laterality: N/A;   Patient Active Problem List   Diagnosis Date Noted   Morbid obesity ([place]) 08/19/2021   Coronary artery disease involving native coronary artery of native heart with angina pectoris (HCC)    Congestive heart failure (CHF) ([place]) 07/19/2021   CAD, multiple vessel 06/25/2021   DM (diabetes mellitus) type II, controlled, with peripheral vascular disorder (HCC)    Chronic kidney disease (CKD) stage G3b/A2, moderately decreased glomerular filtration rate (GFR) between 30-44 mL/min/1.73 square meter and albuminuria creatinine ratio between 30-299 mg/g (HCC)    Unstable angina ([place]) 06/07/2021   Personal history of diabetic foot ulcer 06/18/2020   History of amputation of great toe ([place]) 06/18/2020   Osteomyelitis of great toe of right foot ([place])    Diabetic ulcer of toe of right foot associated with diabetes mellitus due to underlying condition, with necrosis of bone (  [place])    Diabetic infection of right foot ([place])    Diabetic ulcer of toe of right foot associated with type 2 diabetes mellitus, with fat layer exposed ([place]) 04/15/2020   OSA on CPAP 09/01/2018   S/P lumbar fusion 07/06/2018   DDD (degenerative disc disease), lumbar 02/28/2018   Hypertension associated with diabetes ([place]) 02/23/2017   Benign prostatic hyperplasia with incomplete bladder emptying 02/23/2017   Vitamin D deficiency 02/23/2017   Hyperlipidemia associated with type 2 diabetes  mellitus ([place]) 02/23/2017   Type 2 diabetes mellitus with stage 2 chronic kidney disease, without long-term current use of insulin ([place]) 02/23/2017   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    REFERRING PROVIDER: Melina Schools MD  REFERRING DIAG: Low back pain.  THERAPY DIAG: Other low back pain  Muscle spasm of back  Abnormal posture  ONSET DATE: "Around January" of this year.  SUBJECTIVE:                                                                                                                                                                                          SUBJECTIVE STATEMENT: Patient reports that he hasn't done much today but that the muscles have tightened up.  PERTINENT HISTORY: DM, TLIF (07/06/18).  PAIN: Are you having pain? Yes: NPRS scale: 4/10 Pain location: Low back. Pain description: Dull ache. Aggravating factors: Bending. Relieving factors: Rest.  PRECAUTIONS: None  PATIENT GOALS: Decrease pain especially with bending.  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  OBJECTIVE: PATIENT SURVEYS: FOTO 17.  POSTURE: rounded shoulders, forward head, decreased lumbar lordosis, and flexed trunk .  He stand sin 10 degrees of flexion.  PALPATION: C/o diffuse bilateral lumbar musculature pain and which is very taut to palpation.  LUMBAR ROM: Flexion decreased by 50% and extension to upright position.  LOWER EXTREMITY ROM:   In supine:  right hip flexion to 90 degrees and left to 95 degrees.,  LOWER EXTREMITY MMT:   Normal bilateral LE strength.  LUMBAR SPECIAL TESTS: Equal leg lengths.  TRANSFERS:  Sit to supine to sit performed slowly with difficulty.  GAIT: Slow and purposeful with a wide base of support with a decrease in step and stride length.  TODAY'S TREATMENT:  DATE:              01/17/2023      EXERCISE LOG  Exercise Repetitions and Resistance Comments  Nustep L2 x18 min                    Blank cell = exercise not performed today   Manual Therapy Soft Tissue Mobilization: B low back, to reduce muscle tightness and pain    Modalities  Date:01/17/2023  Unattended Estim: Lumbar, IFC, 10 mins, Pain and Tone  ASSESSMENT:  CLINICAL IMPRESSION: Patient presented in clinic with reports of moderate LBP especially L low back. Patient able to tolerate nustep with no complaints. Transfers completed at slower pace as well as bed mobility in order to position for STW. Greater L lumbar tightness palpable with manual therapy. Normal modalities response noted following removal of the modalities.  OBJECTIVE IMPAIRMENTS: Abnormal gait, decreased activity tolerance, decreased mobility, decreased ROM, increased muscle spasms, postural dysfunction, and pain.  ACTIVITY LIMITATIONS: carrying, lifting, and bending  PARTICIPATION LIMITATIONS: occupation and yard work  PERSONAL FACTORS: 1 comorbidity: lumbar fusion  are also affecting patient's functional outcome.  REHAB POTENTIAL: Good  CLINICAL DECISION MAKING: Stable/uncomplicated  EVALUATION COMPLEXITY: Low  GOALS: LONG TERM GOALS: Target date: 02/08/23.  1. Ind with an HEP. Goal status: INITIAL  2.  Perform ADL's with pain not > 3/10. Goal status: INITIAL  3.  Lift using correct body mechanics with pain not > a 3-4/10. Goal status: INITIAL PLAN:  PT FREQUENCY: 2x/week  PT DURATION: 4 weeks  PLANNED INTERVENTIONS: Therapeutic exercises, Therapeutic activity, Patient/Family education, Self Care, Dry Needling, Electrical stimulation, Cryotherapy, Moist heat, Ultrasound, and Manual therapy.  PLAN FOR NEXT SESSION: STW/M, dry needling, spinal protection techniques and body mechanics training.  Standley Brooking, PTA 01/17/23 12:05 PM

## 2023-01-21 ENCOUNTER — Other Ambulatory Visit: Payer: Self-pay | Admitting: Family Medicine

## 2023-01-21 DIAGNOSIS — E1122 Type 2 diabetes mellitus with diabetic chronic kidney disease: Secondary | ICD-10-CM

## 2023-01-24 ENCOUNTER — Ambulatory Visit: Payer: Medicare HMO

## 2023-01-24 DIAGNOSIS — M5459 Other low back pain: Secondary | ICD-10-CM

## 2023-01-24 DIAGNOSIS — R293 Abnormal posture: Secondary | ICD-10-CM | POA: Diagnosis not present

## 2023-01-24 DIAGNOSIS — M62830 Muscle spasm of back: Secondary | ICD-10-CM

## 2023-01-24 NOTE — Therapy (Signed)
OUTPATIENT PHYSICAL THERAPY THORACOLUMBAR TREATMENT   Patient Name: [name] MRN: [id number] DOB:1956-08-20, 67 y.o., male Today's Date: 01/24/2023  END OF SESSION:  PT End of Session - 01/24/23 1124     Visit Number 3    Number of Visits 8    Date for PT Re-Evaluation 02/08/23    Authorization Type FOTO AT LEAST EVERY 5TH VISIT.  PROGRESS NOTE AT 10TH VISIT.  KX MODIFIER AFTER 15 VISITS.    PT Start Time 1115    PT Stop Time 1211    PT Time Calculation (min) 56 min    Activity Tolerance Patient tolerated treatment well    Behavior During Therapy [facility] for tasks assessed/performed            Past Medical History:  Diagnosis Date   Acute combined systolic and diastolic heart failure ([place]) 07/19/2021   Chronic kidney disease    Complication of anesthesia    Hard to wake   Degenerative joint disease (DJD) of lumbar spine    Diabetes mellitus without complication ([place])    Type II   Essential hypertension 02/23/2017   Lumbar stenosis    NSTEMI (non-ST elevated myocardial infarction) ([place]) 06/07/2021   Past Surgical History:  Procedure Laterality Date   AMPUTATION TOE Right 05/17/2020   Procedure: AMPUTATION TOE partial right great toe;  Surgeon: Evelina Bucy, DPM;  [facility];  Service: Podiatry;  Laterality: Right;   BELPHAROPTOSIS REPAIR     CARDIAC CATHETERIZATION     CORONARY STENT INTERVENTION N/A 06/11/2021   Procedure: CORONARY STENT INTERVENTION;  Surgeon: Martinique, Peter M, MD;  Location: [place] [facility];  Service: Cardiovascular;  Laterality: N/A;   EYE SURGERY     HEMORRHOID SURGERY     INTRAVASCULAR ULTRASOUND/IVUS N/A 06/11/2021   Procedure: Intravascular Ultrasound/IVUS;  Surgeon: Martinique, Peter M, MD;  Location: [place] [facility];  Service: Cardiovascular;  Laterality: N/A;   LEFT HEART CATH AND CORONARY ANGIOGRAPHY N/A 06/09/2021   Procedure: LEFT HEART CATH AND CORONARY ANGIOGRAPHY;  Surgeon: Martinique, Peter M, MD;  Location: [place] [facility]  LAB;  Service: Cardiovascular;  Laterality: N/A;   LEFT HEART CATH AND CORONARY ANGIOGRAPHY N/A 07/23/2021   Procedure: LEFT HEART CATH AND CORONARY ANGIOGRAPHY;  Surgeon: Lorretta Harp, MD;  Location: [place] [facility] LAB;  Service: Cardiovascular;  Laterality: N/A;   skin cancer removed right ear     TRANSFORAMINAL LUMBAR INTERBODY FUSION (TLIF) WITH PEDICLE SCREW FIXATION 2 LEVEL N/A 07/06/2018   Procedure: TRANSFORAMINAL LUMBAR INTERBODY FUSION (TLIF) L4-S1;  Surgeon: Melina Schools, MD;  Location: [place];  Service: Orthopedics;  Laterality: N/A;   Patient Active Problem List   Diagnosis Date Noted   Morbid obesity ([place]) 08/19/2021   Coronary artery disease involving native coronary artery of native heart with angina pectoris (HCC)    Congestive heart failure (CHF) ([place]) 07/19/2021   CAD, multiple vessel 06/25/2021   DM (diabetes mellitus) type II, controlled, with peripheral vascular disorder (HCC)    Chronic kidney disease (CKD) stage G3b/A2, moderately decreased glomerular filtration rate (GFR) between 30-44 mL/min/1.73 square meter and albuminuria creatinine ratio between 30-299 mg/g (HCC)    Unstable angina ([place]) 06/07/2021   Personal history of diabetic foot ulcer 06/18/2020   History of amputation of great toe ([place]) 06/18/2020   Osteomyelitis of great toe of right foot ([place])    Diabetic ulcer of toe of right foot associated with diabetes mellitus due to underlying condition, with necrosis of bone (  [place])    Diabetic infection of right foot ([place])    Diabetic ulcer of toe of right foot associated with type 2 diabetes mellitus, with fat layer exposed ([place]) 04/15/2020   OSA on CPAP 09/01/2018   S/P lumbar fusion 07/06/2018   DDD (degenerative disc disease), lumbar 02/28/2018   Hypertension associated with diabetes ([place]) 02/23/2017   Benign prostatic hyperplasia with incomplete bladder emptying 02/23/2017   Vitamin D deficiency 02/23/2017   Hyperlipidemia associated with type 2 diabetes  mellitus ([place]) 02/23/2017   Type 2 diabetes mellitus with stage 2 chronic kidney disease, without long-term current use of insulin ([place]) 02/23/2017   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    REFERRING PROVIDER: Melina Schools MD  REFERRING DIAG: Low back pain.  THERAPY DIAG: Other low back pain  Muscle spasm of back  Abnormal posture  ONSET DATE: "Around January" of this year.  SUBJECTIVE:                                                                                                                                                                                          SUBJECTIVE STATEMENT: Patient reports feeling good today other than "a little back pain."  PERTINENT HISTORY: DM, TLIF (07/06/18).  PAIN: Are you having pain? Yes: NPRS scale: 4/10 Pain location: Low back. Pain description: Dull ache. Aggravating factors: Bending. Relieving factors: Rest.  PRECAUTIONS: None  PATIENT GOALS: Decrease pain especially with bending.  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  OBJECTIVE: PATIENT SURVEYS: FOTO 78.  POSTURE: rounded shoulders, forward head, decreased lumbar lordosis, and flexed trunk .  He stand sin 10 degrees of flexion.  PALPATION: C/o diffuse bilateral lumbar musculature pain and which is very taut to palpation.  LUMBAR ROM: Flexion decreased by 50% and extension to upright position.  LOWER EXTREMITY ROM:   In supine:  right hip flexion to 90 degrees and left to 95 degrees.,  LOWER EXTREMITY MMT:   Normal bilateral LE strength.  LUMBAR SPECIAL TESTS: Equal leg lengths.  TRANSFERS:  Sit to supine to sit performed slowly with difficulty.  GAIT: Slow and purposeful with a wide base of support with a decrease in step and stride length.  TODAY'S TREATMENT:  DATE:              01/24/2023     EXERCISE  LOG  Exercise Repetitions and Resistance Comments  Nustep L4 x18 min        Blank cell = exercise not performed today   Manual Therapy Soft Tissue Mobilization: B low back, to reduce muscle tightness and pain    Modalities  Date:01/24/2023  Unattended Estim: Lumbar, IFC 80-150 hz, 15 mins, Pain and Tone Hot Pack: Lumbar, 15 mins, Pain and Tone  ASSESSMENT: Pt arrives for today's treatment session reporting 4/10 low back pain.   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  Pt able to tolerate increased resistance on Nustep for warm-up without issue.  STW/M performed to right lumbar paraspinals and QL to decrease pain and tone with pt in left side- lying for comfort.  Normal responses to estim and MH noted upon removal.  Pt reported decreased pain at completion of today's treatment session.   OBJECTIVE IMPAIRMENTS: Abnormal gait, decreased activity tolerance, decreased mobility, decreased ROM, increased muscle spasms, postural dysfunction, and pain.  ACTIVITY LIMITATIONS: carrying, lifting, and bending  PARTICIPATION LIMITATIONS: occupation and yard work  PERSONAL FACTORS: 1 comorbidity: lumbar fusion  are also affecting patient's functional outcome.  REHAB POTENTIAL: Good  CLINICAL DECISION MAKING: Stable/uncomplicated  EVALUATION COMPLEXITY: Low  GOALS: LONG TERM GOALS: Target date: 02/08/23.  1. Ind with an HEP. Goal status: INITIAL  2.  Perform ADL's with pain not > 3/10. Goal status: INITIAL  3.  Lift using correct body mechanics with pain not > a 3-4/10. Goal status: INITIAL PLAN:  PT FREQUENCY: 2x/week  PT DURATION: 4 weeks  PLANNED INTERVENTIONS: Therapeutic exercises, Therapeutic activity, Patient/Family education, Self Care, Dry Needling, Electrical stimulation, Cryotherapy, Moist heat, Ultrasound, and Manual therapy.  PLAN FOR NEXT SESSION: STW/M, dry needling, spinal protection techniques and  body mechanics training.  Standley Brooking, PTA 01/24/23 12:11 PM

## 2023-01-25 ENCOUNTER — Ambulatory Visit: Payer: Medicare HMO | Admitting: Podiatry

## 2023-01-26 ENCOUNTER — Other Ambulatory Visit: Payer: Medicare HMO

## 2023-01-26 DIAGNOSIS — D631 Anemia in chronic kidney disease: Secondary | ICD-10-CM | POA: Diagnosis not present

## 2023-01-26 DIAGNOSIS — N19 Unspecified kidney failure: Secondary | ICD-10-CM | POA: Diagnosis not present

## 2023-01-26 DIAGNOSIS — E1122 Type 2 diabetes mellitus with diabetic chronic kidney disease: Secondary | ICD-10-CM | POA: Diagnosis not present

## 2023-01-26 DIAGNOSIS — N189 Chronic kidney disease, unspecified: Secondary | ICD-10-CM | POA: Diagnosis not present

## 2023-01-26 DIAGNOSIS — I129 Hypertensive chronic kidney disease with stage 1 through stage 4 chronic kidney disease, or unspecified chronic kidney disease: Secondary | ICD-10-CM | POA: Diagnosis not present

## 2023-01-26 DIAGNOSIS — R808 Other proteinuria: Secondary | ICD-10-CM | POA: Diagnosis not present

## 2023-01-29 DIAGNOSIS — I509 Heart failure, unspecified: Secondary | ICD-10-CM | POA: Diagnosis not present

## 2023-01-29 DIAGNOSIS — E1142 Type 2 diabetes mellitus with diabetic polyneuropathy: Secondary | ICD-10-CM | POA: Diagnosis not present

## 2023-01-29 DIAGNOSIS — N1832 Chronic kidney disease, stage 3b: Secondary | ICD-10-CM | POA: Diagnosis not present

## 2023-01-29 DIAGNOSIS — E669 Obesity, unspecified: Secondary | ICD-10-CM | POA: Diagnosis not present

## 2023-01-29 DIAGNOSIS — Z7984 Long term (current) use of oral hypoglycemic drugs: Secondary | ICD-10-CM | POA: Diagnosis not present

## 2023-01-29 DIAGNOSIS — Z794 Long term (current) use of insulin: Secondary | ICD-10-CM | POA: Diagnosis not present

## 2023-01-29 DIAGNOSIS — E785 Hyperlipidemia, unspecified: Secondary | ICD-10-CM | POA: Diagnosis not present

## 2023-01-29 DIAGNOSIS — R32 Unspecified urinary incontinence: Secondary | ICD-10-CM | POA: Diagnosis not present

## 2023-01-29 DIAGNOSIS — Z8249 Family history of ischemic heart disease and other diseases of the circulatory system: Secondary | ICD-10-CM | POA: Diagnosis not present

## 2023-01-29 DIAGNOSIS — Z008 Encounter for other general examination: Secondary | ICD-10-CM | POA: Diagnosis not present

## 2023-01-29 DIAGNOSIS — I2510 Atherosclerotic heart disease of native coronary artery without angina pectoris: Secondary | ICD-10-CM | POA: Diagnosis not present

## 2023-01-29 DIAGNOSIS — E1122 Type 2 diabetes mellitus with diabetic chronic kidney disease: Secondary | ICD-10-CM | POA: Diagnosis not present

## 2023-01-29 DIAGNOSIS — I130 Hypertensive heart and chronic kidney disease with heart failure and stage 1 through stage 4 chronic kidney disease, or unspecified chronic kidney disease: Secondary | ICD-10-CM | POA: Diagnosis not present

## 2023-01-31 ENCOUNTER — Ambulatory Visit (INDEPENDENT_AMBULATORY_CARE_PROVIDER_SITE_OTHER): Payer: Medicare HMO | Admitting: Family Medicine

## 2023-01-31 ENCOUNTER — Encounter: Payer: Self-pay | Admitting: Family Medicine

## 2023-01-31 ENCOUNTER — Ambulatory Visit: Payer: Medicare HMO

## 2023-01-31 VITALS — BP 124/62 | HR 61 | Temp 98.4°F | Ht 72.0 in | Wt 219.0 lb

## 2023-01-31 DIAGNOSIS — E1122 Type 2 diabetes mellitus with diabetic chronic kidney disease: Secondary | ICD-10-CM | POA: Diagnosis not present

## 2023-01-31 DIAGNOSIS — I152 Hypertension secondary to endocrine disorders: Secondary | ICD-10-CM

## 2023-01-31 DIAGNOSIS — E785 Hyperlipidemia, unspecified: Secondary | ICD-10-CM

## 2023-01-31 DIAGNOSIS — I2510 Atherosclerotic heart disease of native coronary artery without angina pectoris: Secondary | ICD-10-CM | POA: Diagnosis not present

## 2023-01-31 DIAGNOSIS — M5459 Other low back pain: Secondary | ICD-10-CM

## 2023-01-31 DIAGNOSIS — N1832 Chronic kidney disease, stage 3b: Secondary | ICD-10-CM | POA: Diagnosis not present

## 2023-01-31 DIAGNOSIS — E1159 Type 2 diabetes mellitus with other circulatory complications: Secondary | ICD-10-CM

## 2023-01-31 DIAGNOSIS — E1169 Type 2 diabetes mellitus with other specified complication: Secondary | ICD-10-CM

## 2023-01-31 DIAGNOSIS — M62830 Muscle spasm of back: Secondary | ICD-10-CM

## 2023-01-31 DIAGNOSIS — R293 Abnormal posture: Secondary | ICD-10-CM | POA: Diagnosis not present

## 2023-01-31 MED ORDER — FUROSEMIDE 40 MG PO TABS: 40.0000 mg | ORAL_TABLET | Freq: Every day | ORAL | 3 refills | Status: DC

## 2023-01-31 NOTE — Therapy (Signed)
OUTPATIENT PHYSICAL THERAPY THORACOLUMBAR TREATMENT   Patient Name: [name] MRN: [id number] DOB:1956/02/17, 67 y.o., male Today's Date: 01/31/2023  END OF SESSION:  PT End of Session - 01/31/23 1116     Visit Number 4    Number of Visits 8    Date for PT Re-Evaluation 02/08/23    Authorization Type FOTO AT LEAST EVERY 5TH VISIT.  PROGRESS NOTE AT 10TH VISIT.  KX MODIFIER AFTER 15 VISITS.    PT Start Time 1115    PT Stop Time 1213    PT Time Calculation (min) 58 min    Activity Tolerance Patient tolerated treatment well    Behavior During Therapy [facility] for tasks assessed/performed            Past Medical History:  Diagnosis Date   Acute combined systolic and diastolic heart failure ([place]) 07/19/2021   Chronic kidney disease    Complication of anesthesia    Hard to wake   Degenerative joint disease (DJD) of lumbar spine    Diabetes mellitus without complication ([place])    Type II   Essential hypertension 02/23/2017   Lumbar stenosis    NSTEMI (non-ST elevated myocardial infarction) ([place]) 06/07/2021   Past Surgical History:  Procedure Laterality Date   AMPUTATION TOE Right 05/17/2020   Procedure: AMPUTATION TOE partial right great toe;  Surgeon: Evelina Bucy, DPM;  [facility];  Service: Podiatry;  Laterality: Right;   BELPHAROPTOSIS REPAIR     CARDIAC CATHETERIZATION     CORONARY STENT INTERVENTION N/A 06/11/2021   Procedure: CORONARY STENT INTERVENTION;  Surgeon: Martinique, Peter M, MD;  Location: [place] [facility];  Service: Cardiovascular;  Laterality: N/A;   EYE SURGERY     HEMORRHOID SURGERY     INTRAVASCULAR ULTRASOUND/IVUS N/A 06/11/2021   Procedure: Intravascular Ultrasound/IVUS;  Surgeon: Martinique, Peter M, MD;  Location: [place] [facility];  Service: Cardiovascular;  Laterality: N/A;   LEFT HEART CATH AND CORONARY ANGIOGRAPHY N/A 06/09/2021   Procedure: LEFT HEART CATH AND CORONARY ANGIOGRAPHY;  Surgeon: Martinique, Peter M, MD;  Location: [place] [facility]  [facility];  Service: Cardiovascular;  Laterality: N/A;   LEFT HEART CATH AND CORONARY ANGIOGRAPHY N/A 07/23/2021   Procedure: LEFT HEART CATH AND CORONARY ANGIOGRAPHY;  Surgeon: Lorretta Harp, MD;  Location: [place] [facility];  Service: Cardiovascular;  Laterality: N/A;   skin cancer removed right ear     TRANSFORAMINAL LUMBAR INTERBODY FUSION (TLIF) WITH PEDICLE SCREW FIXATION 2 LEVEL N/A 07/06/2018   Procedure: TRANSFORAMINAL LUMBAR INTERBODY FUSION (TLIF) L4-S1;  Surgeon: Melina Schools, MD;  Location: [place];  Service: Orthopedics;  Laterality: N/A;   Patient Active Problem List   Diagnosis Date Noted   Morbid obesity ([place]) 08/19/2021   Coronary artery disease involving native coronary artery of native heart with angina pectoris (HCC)    Congestive heart failure (CHF) ([place]) 07/19/2021   CAD, multiple vessel 06/25/2021   DM (diabetes mellitus) type II, controlled, with peripheral vascular disorder (HCC)    Chronic kidney disease (CKD) stage G3b/A2, moderately decreased glomerular filtration rate (GFR) between 30-44 mL/min/1.73 square meter and albuminuria creatinine ratio between 30-299 mg/g (HCC)    Unstable angina ([place]) 06/07/2021   Personal history of diabetic foot ulcer 06/18/2020   History of amputation of great toe ([place]) 06/18/2020   Osteomyelitis of great toe of right foot ([place])    Diabetic ulcer of toe of right foot associated with diabetes mellitus due to underlying condition, with necrosis of bone (  [place])    Diabetic infection of right foot ([place])    Diabetic ulcer of toe of right foot associated with type 2 diabetes mellitus, with fat layer exposed ([place]) 04/15/2020   OSA on CPAP 09/01/2018   S/P lumbar fusion 07/06/2018   DDD (degenerative disc disease), lumbar 02/28/2018   Hypertension associated with diabetes ([place]) 02/23/2017   Benign prostatic hyperplasia with incomplete bladder emptying 02/23/2017   Vitamin D deficiency 02/23/2017   Hyperlipidemia associated with type 2 diabetes  mellitus ([place]) 02/23/2017   Type 2 diabetes mellitus with stage 2 chronic kidney disease, without long-term current use of insulin ([place]) 02/23/2017   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    REFERRING PROVIDER: Melina Schools MD  REFERRING DIAG: Low back pain.  THERAPY DIAG: Other low back pain  Muscle spasm of back  Abnormal posture  ONSET DATE: "Around January" of this year.  SUBJECTIVE:                                                                                                                                                                                          SUBJECTIVE STATEMENT: Patient reports feeling good today other than "a little back pain."  PERTINENT HISTORY: DM, TLIF (07/06/18).  PAIN: Are you having pain? Yes: NPRS scale: 4/10 Pain location: Low back. Pain description: Dull ache. Aggravating factors: Bending. Relieving factors: Rest.  PRECAUTIONS: None  PATIENT GOALS: Decrease pain especially with bending.  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  OBJECTIVE: PATIENT SURVEYS: FOTO 78.  POSTURE: rounded shoulders, forward head, decreased lumbar lordosis, and flexed trunk .  He stand sin 10 degrees of flexion.  PALPATION: C/o diffuse bilateral lumbar musculature pain and which is very taut to palpation.  LUMBAR ROM: Flexion decreased by 50% and extension to upright position.  LOWER EXTREMITY ROM:   In supine:  right hip flexion to 90 degrees and left to 95 degrees.,  LOWER EXTREMITY MMT:   Normal bilateral LE strength.  LUMBAR SPECIAL TESTS: Equal leg lengths.  TRANSFERS:  Sit to supine to sit performed slowly with difficulty.  GAIT: Slow and purposeful with a wide base of support with a decrease in step and stride length.  TODAY'S TREATMENT:  DATE:              01/31/2023     EXERCISE  LOG  Exercise Repetitions and Resistance Comments  Nustep L4 x18 min        Blank cell = exercise not performed today   Manual Therapy Soft Tissue Mobilization: B low back, to reduce muscle tightness and pain    Modalities  Date:01/31/2023  Unattended Estim: Lumbar, IFC 80-150 hz, 15 mins, Pain and Tone Hot Pack: Lumbar, 15 mins, Pain and Tone  ASSESSMENT: Pt arrives for today's treatment session reporting 4/10 low back pain.   Pt able to increase FOTO score to 67 today.  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  STW/M to bil lumbar paraspinals with concentration on left side today.  Normal responses to estim and MH noted upon removal.  Pt reported decreased pain at completion of today's treatment session.  OBJECTIVE IMPAIRMENTS: Abnormal gait, decreased activity tolerance, decreased mobility, decreased ROM, increased muscle spasms, postural dysfunction, and pain.  ACTIVITY LIMITATIONS: carrying, lifting, and bending  PARTICIPATION LIMITATIONS: occupation and yard work  PERSONAL FACTORS: 1 comorbidity: lumbar fusion  are also affecting patient's functional outcome.  REHAB POTENTIAL: Good  CLINICAL DECISION MAKING: Stable/uncomplicated  EVALUATION COMPLEXITY: Low  GOALS: LONG TERM GOALS: Target date: 02/08/23.  1. Ind with an HEP. Goal status: MET  2.  Perform ADL's with pain not > 3/10. Goal status: On-going  3.  Lift using correct body mechanics with pain not > a 3-4/10. Goal status: On-going PLAN:  PT FREQUENCY: 2x/week  PT DURATION: 4 weeks  PLANNED INTERVENTIONS: Therapeutic exercises, Therapeutic activity, Patient/Family education, Self Care, Dry Needling, Electrical stimulation, Cryotherapy, Moist heat, Ultrasound, and Manual therapy.  PLAN FOR NEXT SESSION: STW/M, dry needling, spinal protection techniques and body mechanics training.  Eda Paschal, PTA 01/31/23 12:15 PM

## 2023-01-31 NOTE — Progress Notes (Signed)
Subjective: CC:Dm PCP: [name], DO [id number] [name] is a 67 y.o. male presenting to clinic today for:  1. Type 2 Diabetes with hypertension, hyperlipidemia w/ CKD3b:  Patient reports that he is currently injecting 48 units of [place] daily.  He is compliant with his Farxiga, Cozaar, Coreg, Lipitor and Norvasc.  Reports 2 hypoglycemic episodes 269 a couple of weeks ago but he admits that he did not eat sufficiently before taking insulin that day.  He otherwise has been having average blood sugars of around 190.  He uses the freestyle libre continuously for blood sugar monitoring and likes this much better than the fingerstick.  He had a home health nurse come in yesterday and his A1c was 7.1.  Will be seeing Dr. [name] this afternoon.  Labs have already been collected for him.  Last eye exam: needs Last foot exam: needs Last A1c:  Lab Results  Component Value Date   HGBA1C 7.5 (H) 09/17/2022   Nephropathy screen indicated?: needs Last flu, zoster and/or pneumovax:  Immunization History  Administered Date(s) Administered   Influenza,inj,Quad PF,6+ Mos 08/25/2017, 09/01/2018   Influenza,inj,quad, With Preservative 09/15/2017, 09/11/2018   Pneumococcal Polysaccharide-23 04/14/2017    ROS: No chest pain, shortness of breath or falls.  No foot ulcerations.  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  Has diabetic shoes but reports reluctance to wear the new ones because they are still stiff and not broken.   ROS: Per HPI  No Known Allergies Past Medical History:  Diagnosis Date   Acute combined systolic and diastolic heart failure ([place]) 07/19/2021   Chronic kidney disease    Complication of anesthesia    Hard to wake   Degenerative joint disease (DJD) of lumbar spine    Diabetes mellitus without complication ([place])    Type II   Essential hypertension 02/23/2017   Lumbar stenosis    NSTEMI (non-ST elevated myocardial infarction) ([place])  06/07/2021    Current Outpatient Medications:    amLODipine (NORVASC) 10 MG tablet, Take 1 tablet (10 mg total) by mouth daily., Disp: 90 tablet, Rfl: 1   aspirin 81 MG EC tablet, Take 1 tablet (81 mg total) by mouth daily. Swallow whole., Disp: 90 tablet, Rfl: 3   atorvastatin (LIPITOR) 80 MG tablet, TAKE ONE TABLET ONCE DAILY, Disp: 90 tablet, Rfl: 1   carvedilol (COREG) 12.5 MG tablet, TAKE 1 TABLET 2 TIMES A DAY WITH A MEAL, Disp: 60 tablet, Rfl: 6   ciclopirox (PENLAC) 8 % solution, Apply topically at bedtime. Apply over nail and surrounding skin. Apply daily over previous coat. After seven (7) days, may remove with alcohol and continue cycle. (Patient not taking: Reported on 12/08/2022), Disp: 6.6 mL, Rfl: 2   cloNIDine (CATAPRES) 0.3 MG tablet, TAKE (1) TABLET TWICE A DAY for blood pressure, Disp: 180 tablet, Rfl: 3   clopidogrel (PLAVIX) 75 MG tablet, TAKE ONE TABLET ONCE DAILY, Disp: 90 tablet, Rfl: 1   Continuous Blood Gluc Receiver (FREESTYLE LIBRE 2 READER) DEVI, UAD to monitor sugars continuously. E11.22, Disp: 1 each, Rfl: 0   Continuous Blood Gluc Sensor (FREESTYLE LIBRE 2 SENSOR) MISC, Use as directed to monitor sugars continuously. E11.22, Disp: 6 each, Rfl: 4   dapagliflozin propanediol (FARXIGA) 10 MG TABS tablet, Take 1 tablet (10 mg total) by mouth daily before breakfast., Disp: 90 tablet, Rfl: 5   furosemide (LASIX) 40 MG tablet, TAKE ONE TABLET BY MOUTH EVERY DAY, Disp: 90 tablet, Rfl: 0  gabapentin (NEURONTIN) 100 MG capsule, Take 1 capsule (100 mg total) by mouth 2 (two) times daily., Disp: 180 capsule, Rfl: 3   Insulin Pen Needle (PEN NEEDLES) 31G X 5 MM MISC, Use daily with insulin Dx E11.9, Disp: 100 each, Rfl: 3   isosorbide mononitrate (IMDUR) 60 MG 24 hr tablet, TAKE 1 TABLET DAILY, Disp: 90 tablet, Rfl: 2   losartan (COZAAR) 25 MG tablet, Take 25 mg by mouth daily., Disp: , Rfl:    nitroGLYCERIN (NITROSTAT) 0.4 MG SL tablet, Place 1 tablet (0.4 mg total) under the  tongue every 5 (five) minutes as needed for chest pain., Disp: 25 tablet, Rfl: 3   TRESIBA FLEXTOUCH 100 UNIT/ML FlexTouch Pen, INJECT 38 UP TO 50 UNITS DAILY AS DIRECTED. 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 Has another son and one daughter    Raise cattle    Social Determinants of Health   Financial Resource Strain: Low Risk  (07/13/2022)   Overall Financial Resource Strain (CARDIA)    Difficulty of Paying Living Expenses: Not hard at all  Food Insecurity: No Food Insecurity (08/03/2022)   Hunger Vital Sign    Worried About Running Out of Food in the Last Year: Never true    Ran Out of Food in the Last Year: Never true  Transportation Needs: No Transportation Needs (08/03/2022)   PRAPARE - Hydrologist (Medical): No    Lack of Transportation (Non-Medical): No  Physical Activity: Sufficiently Active (07/13/2022)   Exercise Vital Sign    Days of Exercise per Week: 7 days    Minutes of Exercise per Session: 40 min  Stress: No Stress Concern Present (07/13/2022)   [place]    Feeling of Stress : Not at all  Social Connections: Moderately Isolated (03/11/2022)   Social  Connection and Isolation Panel [NHANES]    Frequency of Communication with Friends and Family: More than three times a week    Frequency of Social Gatherings with Friends and Family: More than three times a week    Attends Religious Services: Never    Marine scientist or Organizations: No    Attends Archivist Meetings: Never    Marital Status: Married  Human resources officer Violence: Not At Risk (07/13/2022)   Humiliation, Afraid, Rape, and Kick questionnaire    Fear of Current or Ex-Partner: No    Emotionally Abused: No    Physically Abused: No    Sexually Abused: No   Family History  Problem Relation Age of Onset  Heart attack Father 4       Died suddenly   Hypertension Father    Vision loss Sister        one eye   Cancer Brother        skin   Diabetes Brother     Objective: Office vital signs reviewed. BP 124/62   Pulse 61   Temp 98.4 F (36.9 C)   Ht 6' (1.829 m)   Wt 219 lb (99.3 kg)   SpO2 98%   BMI 29.70 kg/m   Physical Examination:  General: Awake, alert, well nourished, No acute distress HEENT: sclera white, MMM Cardio: regular rate and rhythm, S1S2 heard, no murmurs appreciated Pulm: clear to auscultation bilaterally, no wheezes, rhonchi or rales; normal work of breathing on room air Neuro: see DM foot  Diabetic Foot Exam - Simple   Simple Foot Form Diabetic Foot exam was performed with the following findings: Yes 01/31/2023  2:01 PM  Visual Inspection See comments: Yes Sensation Testing See comments: Yes Pulse Check Posterior Tibialis and Dorsalis pulse intact bilaterally: Yes Comments +1 pedal pulses bilaterally.  Distal great toe of right foot surgically absent.  He has absent monofilament sensation throughout.     Assessment/ Plan: 67 y.o. male   Type 2 diabetes mellitus with stage 3b chronic kidney disease, without long-term current use of insulin (HCC) - Plan: Bayer DCA Hb A1c Waived  Hyperlipidemia associated with type 2  diabetes mellitus ([place])  Hypertension associated with diabetes ([place])  CAD, multiple vessel  A1c with home health visit yesterday was 7.1.  I would still prefer him to be closer to 6.5 so we talked about opportunities in his diet for reduction in carbohydrates.  He is going to continue with freestyle libre continuous monitoring and make these dietary modifications.  Will plan to reconvene again in 3 months for A1c check.  Diabetic foot exam performed today and demonstrated neuropathy.  He has diabetic shoes in place.  I have set him up for diabetic retinal exam 3 THN here in office and he will be contacted with date and time of appointment.  Blood pressure remains well-controlled.  No changes.  Continue current regimen for both blood pressure and cholesterol control  No orders of the defined types were placed in this encounter.  No orders of the defined types were placed in this encounter.    [name], DO [place] [phone]

## 2023-02-01 ENCOUNTER — Ambulatory Visit: Payer: Self-pay | Admitting: *Deleted

## 2023-02-01 NOTE — Patient Outreach (Signed)
Care Coordination   Follow Up Visit Note   08/08/2023 late entry for 02/01/23 Name: [name] MRN: [id number] DOB: 15-Jul-1956  [name] is a 67 y.o. year old male who sees [name], DO for primary care. I spoke with  [name] by phone today.  What matters to the patients health and wellness today?  Outpatient PT- he reports he hurts with the after session  His job causes triggers for his pain includes bending over too far- muscle pain  Want MD to know electric shock help may need retirement but not ready Ortho states arthritis in back  Arthritis vs RA   Diabetes (DM) last Hg A1c 7.1  Chronic Kidney disease (CKD)  he takes 2 lasix when swell spoke with nephrology no fluid restrictions    Goals Addressed               This Visit's Progress     Patient Stated     COMPLETED: Develop plan of care for sleep apnea ([facility]) (pt-stated)        Arthritis vs RA      COMPLETED: Manage diabetes at home [facility]) (pt-stated)        Duplicate goal merged      Manage left side back pain, diabetes, sleep, CHF, chronic kidney disease(RN  CM) (pt-stated)        Care Coordination Interventions: Interventions Today    Flowsheet Row Most Recent Value  Chronic Disease   Chronic disease during today's visit Diabetes, Chronic Kidney Disease/End Stage Renal Disease (ESRD), Other  [Arthritis back pain]  General Interventions   General Interventions Discussed/Reviewed General Interventions Reviewed, Doctor Visits  Doctor Visits Discussed/Reviewed Doctor Visits Reviewed, PCP, Specialist  PCP/Specialist Visits Compliance with follow-up visit  Exercise Interventions   Exercise Discussed/Reviewed Exercise Reviewed, Physical Activity  Physical Activity Discussed/Reviewed Physical Activity Reviewed, Home Exercise Program (HEP)  Education Interventions   Education Provided Provided Education  Provided Verbal Education On Blood Sugar Monitoring, Mental  Health/Coping with Illness, Exercise  Mental Health Interventions   Mental Health Discussed/Reviewed Mental Health Discussed, Coping Strategies  Nutrition Interventions   Nutrition Discussed/Reviewed Nutrition Discussed, Fluid intake  Pharmacy Interventions   Pharmacy Dicussed/Reviewed Pharmacy Topics Discussed, Medications and their functions, Affording Medications  Safety Interventions   Safety Discussed/Reviewed Safety Discussed, Safety Reviewed, Fall Risk           Other     COMPLETED: Manage CHF at home        Arthritis vs RA        SDOH assessments and interventions completed:  No     Care Coordination Interventions:  Yes, provided   Follow up plan: Follow up call scheduled for 08/17/23    Encounter Outcome:  Pt. Visit Completed    Damian Hofstra L. Noelle Penner, RN, BSN, CCM [facility] Care Management Community Coordinator Office number [phone]

## 2023-02-07 ENCOUNTER — Ambulatory Visit: Payer: Medicare HMO

## 2023-02-17 ENCOUNTER — Other Ambulatory Visit: Payer: Self-pay | Admitting: Family Medicine

## 2023-02-17 DIAGNOSIS — N184 Chronic kidney disease, stage 4 (severe): Secondary | ICD-10-CM

## 2023-03-15 ENCOUNTER — Encounter: Payer: Self-pay | Admitting: *Deleted

## 2023-03-15 ENCOUNTER — Ambulatory Visit: Payer: Medicare HMO

## 2023-03-15 VITALS — Ht 72.0 in | Wt 215.0 lb

## 2023-03-15 DIAGNOSIS — Z0000 Encounter for general adult medical examination without abnormal findings: Secondary | ICD-10-CM | POA: Diagnosis not present

## 2023-03-15 DIAGNOSIS — Z1211 Encounter for screening for malignant neoplasm of colon: Secondary | ICD-10-CM

## 2023-03-15 NOTE — Patient Instructions (Signed)
[name] , Thank you for taking time to come for your Medicare Wellness Visit. I appreciate your ongoing commitment to your health goals. Please review the following plan we discussed and let me know if I can assist you in the future.   These are the goals we discussed:  Goals       Client will verbalize knowledge of diabetes self-management as evidenced by Hgb A1C <7 or as defined by provider.      Diabetes self management actions: Glucose monitoring per provider recommendations Perform Quality checks on blood meter Eat Healthy Check feet daily Visit provider every 3-6 months as directed Hbg A1C level every 3-6 months. Eye Exam yearly      Develop plan of care for sleep apnea (THN) (pt-stated)      Care Coordination Interventions: Evaluation of current treatment plan related to obstructive sleep apnea and patient's adherence to plan as established by provider Provided education to patient re: importance of using CPAP for sleep apnea to decrease fatigue, cardiac & respiratory health Discussed plans with patient for ongoing care management follow up and provided patient with direct contact information for care management team Encouraged him again to find out the name of his cpap/bipap Education sent via my chart on sleep apnea      DIET - EAT MORE FRUITS AND VEGETABLES      Limit sweets and carbs Diabetes Mellitus and Nutrition, Adult When you have diabetes, or diabetes mellitus, it is very important to have healthy eating habits because your blood sugar (glucose) levels are greatly affected by what you eat and drink. Eating healthy foods in the right amounts, at about the same times every day, can help you: Control your blood glucose. Lower your risk of heart disease. Improve your blood pressure. Reach or maintain a healthy weight. What can affect my meal plan? Every person with diabetes is different, and each person has different needs for a meal plan. Your health care provider  may recommend that you work with a dietitian to make a meal plan that is best for you. Your meal plan may vary depending on factors such as: The calories you need. The medicines you take. Your weight. Your blood glucose, blood pressure, and cholesterol levels. Your activity level. Other health conditions you have, such as heart or kidney disease. How do carbohydrates affect me? Carbohydrates, also called carbs, affect your blood glucose level more than any other type of food. Eating carbs naturally raises the amount of glucose in your blood. Carb counting is a method for keeping track of how many carbs you eat. Counting carbs is important to keep your blood glucose at a healthy level, especially if you use insulin or take certain oral diabetes medicines. It is important to know how many carbs you can safely have in each meal. This is different for every person. Your dietitian can help you calculate how many carbs you should have at each meal and for each snack. How does alcohol affect me? Alcohol can cause a sudden decrease in blood glucose (hypoglycemia), especially if you use insulin or take certain oral diabetes medicines. Hypoglycemia can be a life-threatening condition. Symptoms of hypoglycemia, such as sleepiness, dizziness, and confusion, are similar to symptoms of having too much alcohol. Do not drink alcohol if: Your health care provider tells you not to drink. You are pregnant, may be pregnant, or are planning to become pregnant. If you drink alcohol: Do not drink on an empty stomach. Limit how much you  use to: 0-1 drink a day for women. 0-2 drinks a day for men. Be aware of how much alcohol is in your drink. In the [place] one drink equals one 12 oz bottle of beer (355 mL), one 5 oz glass of wine (148 mL), or one 1 oz glass of hard liquor (44 mL). Keep yourself hydrated with water, diet soda, or unsweetened iced tea. Keep in mind that regular soda, juice, and other mixers may contain  a lot of sugar and must be counted as carbs. What are tips for following this plan? Reading food labels Start by checking the serving size on the "Nutrition Facts" label of packaged foods and drinks. The amount of calories, carbs, fats, and other nutrients listed on the label is based on one serving of the item. Many items contain more than one serving per package. Check the total grams (g) of carbs in one serving. You can calculate the number of servings of carbs in one serving by dividing the total carbs by 15. For example, if a food has 30 g of total carbs per serving, it would be equal to 2 servings of carbs. Check the number of grams (g) of saturated fats and trans fats in one serving. Choose foods that have a low amount or none of these fats. Check the number of milligrams (mg) of salt (sodium) in one serving. Most people should limit total sodium intake to less than 2,300 mg per day. Always check the nutrition information of foods labeled as "low-fat" or "nonfat." These foods may be higher in added sugar or refined carbs and should be avoided. Talk to your dietitian to identify your daily goals for nutrients listed on the label. Shopping Avoid buying canned, pre-made, or processed foods. These foods tend to be high in fat, sodium, and added sugar. Shop around the outside edge of the grocery store. This is where you will most often find fresh fruits and vegetables, bulk grains, fresh meats, and fresh dairy. Cooking Use low-heat cooking methods, such as baking, instead of high-heat cooking methods like deep frying. Cook using healthy oils, such as olive, canola, or sunflower oil. Avoid cooking with butter, cream, or high-fat meats. Meal planning Eat meals and snacks regularly, preferably at the same times every day. Avoid going long periods of time without eating. Eat foods that are high in fiber, such as fresh fruits, vegetables, beans, and whole grains. Talk with your dietitian about how  many servings of carbs you can eat at each meal. Eat 4-6 oz (112-168 g) of lean protein each day, such as lean meat, chicken, fish, eggs, or tofu. One ounce (oz) of lean protein is equal to: 1 oz (28 g) of meat, chicken, or fish. 1 egg.  cup (62 g) of tofu. Eat some foods each day that contain healthy fats, such as avocado, nuts, seeds, and fish.   What foods should I eat? Fruits Berries. Apples. Oranges. Peaches. Apricots. Plums. Grapes. Mango. Papaya. Pomegranate. Kiwi. Cherries. Vegetables Lettuce. Spinach. Leafy greens, including kale, chard, collard greens, and mustard greens. Beets. Cauliflower. Cabbage. Broccoli. Carrots. Green beans. Tomatoes. Peppers. Onions. Cucumbers. Brussels sprouts. Grains Whole grains, such as whole-wheat or whole-grain bread, crackers, tortillas, cereal, and pasta. Unsweetened oatmeal. Quinoa. Brown or wild rice. Meats and other proteins Seafood. Poultry without skin. Lean cuts of poultry and beef. Tofu. Nuts. Seeds. Dairy Low-fat or fat-free dairy products such as milk, yogurt, and cheese. The items listed above may not be a complete list of foods and  beverages you can eat. Contact a dietitian for more information. What foods should I avoid? Fruits Fruits canned with syrup. Vegetables Canned vegetables. Frozen vegetables with butter or cream sauce. Grains Refined white flour and flour products such as bread, pasta, snack foods, and cereals. Avoid all processed foods. Meats and other proteins Fatty cuts of meat. Poultry with skin. Breaded or fried meats. Processed meat. Avoid saturated fats. Dairy Full-fat yogurt, cheese, or milk. Beverages Sweetened drinks, such as soda or iced tea. The items listed above may not be a complete list of foods and beverages you should avoid. Contact a dietitian for more information. Questions to ask a health care provider Do I need to meet with a diabetes educator? Do I need to meet with a dietitian? What number can  I call if I have questions? When are the best times to check my blood glucose? Where to find more information: American Diabetes Association: diabetes.org Academy of Nutrition and Dietetics: www.eatright.[employer] of Diabetes and Digestive and Kidney Diseases: CarFlippers.tn Association of Diabetes Care and Education Specialists: www.diabeteseducator.org Summary It is important to have healthy eating habits because your blood sugar (glucose) levels are greatly affected by what you eat and drink. A healthy meal plan will help you control your blood glucose and maintain a healthy lifestyle. Your health care provider may recommend that you work with a dietitian to make a meal plan that is best for you. Keep in mind that carbohydrates (carbs) and alcohol have immediate effects on your blood glucose levels. It is important to count carbs and to use alcohol carefully. This information is not intended to replace advice given to you by your health care provider. Make sure you discuss any questions you have with your health care provider. Document Revised: 10/09/2019 Document Reviewed: 10/09/2019 Elsevier Patient Education  2021 Elsevier Inc.       Exercise 150 min/wk Moderate Activity      Have 3 meals a day      Manage CHF at home      Care Coordination Interventions: Provided education on low sodium diet Reviewed Heart Failure Action Plan in depth and provided written copy Advised patient to weigh each morning after emptying bladder Discussed importance of daily weight and advised patient to weigh and record daily Reviewed role of diuretics in prevention of fluid overload and management of heart failure; Discussed the importance of keeping all appointments with provider        Manage diabetes at home [facility]) (pt-stated)      Care Coordination Interventions: Discussed plans with patient for ongoing care management follow up and provided patient with direct contact information  for care management team Confirmed cbg values have improved      Manage left side back pain (THN) (pt-stated)      Care Coordination Interventions: Interventions Today    Flowsheet Row Most Recent Value  Chronic Disease   Chronic disease during today's visit Other  [back pain]  General Interventions   General Interventions Discussed/Reviewed General Interventions Reviewed, Communication with  Communication with RN, PCP/Specialists  [Received a message and returned a call to leave a message for [employer] RN, related to Mr Asare voiced concerns about wanting 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   Interventions: 1:1 collaboration with [name], DO regarding development and update of comprehensive plan of care as evidenced by provider attestation and co-signature Inter-disciplinary care team collaboration (see longitudinal plan of care) Comprehensive medication review performed; medication list updated in electronic medical record  Diabetes: Goal on Track (progressing): YES. Uncontrolled-A1C 7.9%, GFR 39; current treatment: tresiba 56 units daily, farxiga 10mg  daily;  Due to ease of patient assistance and better evidence in CKD/CV transitioned jardiance to -->farxiga 10mg  daily Application submitted to az[employer]me patient assistance--patient approved Escribed farxiga 10mg  to [facility] Patient received; tolerating well Denies hypoglycemic/hyperglycemic symptoms Discussed meal planning options and Plate method for healthy eating Avoid sugary  drinks and desserts Incorporate balanced protein, non starchy veggies, 1 serving of carbohydrate with each meal Increase water intake Increase physical activity as able Current exercise: n/a Assessed patient finances. Application approved to az&me PAP for farxiga   Patient Goals/Self-Care Activities patient will:  - take medications as prescribed as evidenced by patient report and record review check glucose daily or if symptomatic, document, and provide at future appointments collaborate with provider on medication access solutions target a minimum of 150 minutes of moderate intensity exercise weekly engage in dietary modifications by FOLLOWING A HEART HEALTHY DIET/HEALTHY PLATE METHOD          This is a list of the screening recommended for you and due dates:  Health Maintenance  Topic Date Due   Eye exam for diabetics  10/29/2022   Pneumonia Vaccine (2 of 2 - PCV) 03/17/2023*   Zoster (Shingles) Vaccine (1 of 2) 05/03/2023*   Cologuard (Stool DNA test)  06/17/2023*   Yearly kidney health urinalysis for diabetes  01/31/2028*   Hemoglobin A1C  03/18/2023   Flu Shot  06/16/2023   Yearly kidney function blood test for diabetes  11/19/2023   Complete foot exam   01/31/2024   Medicare Annual Wellness Visit  03/14/2024   Hepatitis C Screening: USPSTF Recommendation to screen - Ages 18-79 yo.  Completed   HPV Vaccine  Aged Out   DTaP/Tdap/Td vaccine  Discontinued   COVID-19 Vaccine  Discontinued  *Topic was postponed. The date shown is not the original due date.    Advanced directives: Advance directive discussed with you today. I have provided a copy for you to complete at home and have notarized. Once this is complete please bring a copy in to our office so we can scan it into your chart.   Conditions/risks identified: Aim for 30 minutes of exercise or brisk walking, 6-8 glasses of water, and 5 servings of fruits and vegetables each day.   Next appointment: Follow up in one  year for your annual wellness visit.   Preventive Care 79 Years and Older, Male  Preventive care refers to lifestyle choices and visits with your health care provider that can promote health and wellness. What does preventive care include? A yearly physical exam. This is also called an annual well check. Dental exams once or twice a year. Routine eye exams. Ask your health care provider how often you should have your eyes checked. Personal lifestyle choices, including: Daily care of your teeth and gums. Regular physical activity. Eating a healthy diet. Avoiding tobacco and drug use. Limiting alcohol use. Practicing safe sex. Taking low doses of aspirin  every day. Taking vitamin and mineral supplements as recommended by your health care provider. What happens during an annual well check? The services and screenings done by your health care provider during your annual well check will depend on your age, overall health, lifestyle risk factors, and family history of disease. Counseling  Your health care provider may ask you questions about your: Alcohol use. Tobacco use. Drug use. Emotional well-being. Home and relationship well-being. Sexual activity. Eating habits. History of falls. Memory and ability to understand (cognition). Work and work Astronomer. Screening  You may have the following tests or measurements: Height, weight, and BMI. Blood pressure. Lipid and cholesterol levels. These may be checked every 5 years, or more frequently if you are over 41 years old. Skin check. Lung cancer screening. You may have this screening every year starting at age 63 if you have a 30-pack-year history of smoking and currently smoke or have quit within the past 15 years. Fecal occult blood test (FOBT) of the stool. You may have this test every year starting at age 36. Flexible sigmoidoscopy or colonoscopy. You may have a sigmoidoscopy every 5 years or a colonoscopy every 10 years starting  at age 4. Prostate cancer screening. Recommendations will vary depending on your family history and other risks. Hepatitis C blood test. Hepatitis B blood test. Sexually transmitted disease (STD) testing. Diabetes screening. This is done by checking your blood sugar (glucose) after you have not eaten for a while (fasting). You may have this done every 1-3 years. Abdominal aortic aneurysm (AAA) screening. You may need this if you are a current or former smoker. Osteoporosis. You may be screened starting at age 44 if you are at high risk. Talk with your health care provider about your test results, treatment options, and if necessary, the need for more tests. Vaccines  Your health care provider may recommend certain vaccines, such as: Influenza vaccine. This is recommended every year. Tetanus, diphtheria, and acellular pertussis (Tdap, Td) vaccine. You may need a Td booster every 10 years. Zoster vaccine. You may need this after age 36. Pneumococcal 13-valent conjugate (PCV13) vaccine. One dose is recommended after age 52. Pneumococcal polysaccharide (PPSV23) vaccine. One dose is recommended after age 79. Talk to your health care provider about which screenings and vaccines you need and how often you need them. This information is not intended to replace advice given to you by your health care provider. Make sure you discuss any questions you have with your health care provider. Document Released: 11/28/2015 Document Revised: 07/21/2016 Document Reviewed: 09/02/2015 Elsevier Interactive Patient Education  2017 [employer].  Fall Prevention in the Home Falls can cause injuries. They can happen to people of all ages. There are many things you can do to make your home safe and to help prevent falls. What can I do on the outside of my home? Regularly fix the edges of walkways and driveways and fix any cracks. Remove anything that might make you trip as you walk through a door, such as a raised  step or threshold. Trim any bushes or trees on the path to your home. Use bright outdoor lighting. Clear any walking paths of anything that might make someone trip, such as rocks or tools. Regularly check to see if handrails are loose or broken. Make sure that both sides of any steps have handrails. Any raised decks and porches should have guardrails on the edges. Have any leaves, snow, or ice cleared regularly. Use sand or salt on walking  paths during winter. Clean up any spills in your garage right away. This includes oil or grease spills. What can I do in the bathroom? Use night lights. Install grab bars by the toilet and in the tub and shower. Do not use towel bars as grab bars. Use non-skid mats or decals in the tub or shower. If you need to sit down in the shower, use a plastic, non-slip stool. Keep the floor dry. Clean up any water that spills on the floor as soon as it happens. Remove soap buildup in the tub or shower regularly. Attach bath mats securely with double-sided non-slip rug tape. Do not have throw rugs and other things on the floor that can make you trip. What can I do in the bedroom? Use night lights. Make sure that you have a light by your bed that is easy to reach. Do not use any sheets or blankets that are too big for your bed. They should not hang down onto the floor. Have a firm chair that has side arms. You can use this for support while you get dressed. Do not have throw rugs and other things on the floor that can make you trip. What can I do in the kitchen? Clean up any spills right away. Avoid walking on wet floors. Keep items that you use a lot in easy-to-reach places. If you need to reach something above you, use a strong step stool that has a grab bar. Keep electrical cords out of the way. Do not use floor polish or wax that makes floors slippery. If you must use wax, use non-skid floor wax. Do not have throw rugs and other things on the floor that can  make you trip. What can I do with my stairs? Do not leave any items on the stairs. Make sure that there are handrails on both sides of the stairs and use them. Fix handrails that are broken or loose. Make sure that handrails are as long as the stairways. Check any carpeting to make sure that it is firmly attached to the stairs. Fix any carpet that is loose or worn. Avoid having throw rugs at the top or bottom of the stairs. If you do have throw rugs, attach them to the floor with carpet tape. Make sure that you have a light switch at the top of the stairs and the bottom of the stairs. If you do not have them, ask someone to add them for you. What else can I do to help prevent falls? Wear shoes that: Do not have high heels. Have rubber bottoms. Are comfortable and fit you well. Are closed at the toe. Do not wear sandals. If you use a stepladder: Make sure that it is fully opened. Do not climb a closed stepladder. Make sure that both sides of the stepladder are locked into place. Ask someone to hold it for you, if possible. Clearly mark and make sure that you can see: Any grab bars or handrails. First and last steps. Where the edge of each step is. Use tools that help you move around (mobility aids) if they are needed. These include: Canes. Walkers. Scooters. Crutches. Turn on the lights when you go into a dark area. Replace any light bulbs as soon as they burn out. Set up your furniture so you have a clear path. Avoid moving your furniture around. If any of your floors are uneven, fix them. If there are any pets around you, be aware of where they are. Review  your medicines with your doctor. Some medicines can make you feel dizzy. This can increase your chance of falling. Ask your doctor what other things that you can do to help prevent falls. This information is not intended to replace advice given to you by your health care provider. Make sure you discuss any questions you have with  your health care provider. Document Released: 08/28/2009 Document Revised: 04/08/2016 Document Reviewed: 12/06/2014 Elsevier Interactive Patient Education  2017 [employer].

## 2023-03-15 NOTE — Progress Notes (Signed)
Subjective:   [name] is a 67 y.o. male who presents for Medicare Annual/Subsequent preventive examination. I connected with  [name] on 03/15/23 by a audio enabled telemedicine application and verified that I am speaking with the correct person using two identifiers.  Patient Location: Home  Provider Location: Home Office  I discussed the limitations of evaluation and management by telemedicine. The patient expressed understanding and agreed to proceed.  Review of Systems     Cardiac Risk Factors include: advanced age (>23men, >1 women);diabetes mellitus;hypertension;male gender;dyslipidemia     Objective:    Today's Vitals   03/15/23 1025  Weight: 215 lb (97.5 kg)  Height: 6' (1.829 m)   Body mass index is 29.16 kg/m.     03/15/2023   10:30 AM 01/11/2023    1:15 PM 03/11/2022   10:44 AM 07/19/2021    8:11 PM 07/19/2021   10:56 AM 06/07/2021    9:00 PM 03/10/2021   10:37 AM  Advanced Directives  Does Patient Have a Medical Advance Directive? No No No No No No No  Would patient like information on creating a medical advance directive? No - Patient declined  No - Patient declined No - Patient declined  No - Patient declined Yes (MAU/Ambulatory/Procedural Areas - Information given)    Current Medications (verified) Outpatient Encounter Medications as of 03/15/2023  Medication Sig   amLODipine (NORVASC) 10 MG tablet Take 1 tablet (10 mg total) by mouth daily.   aspirin 81 MG EC tablet Take 1 tablet (81 mg total) by mouth daily. Swallow whole.   atorvastatin (LIPITOR) 80 MG tablet TAKE ONE TABLET ONCE DAILY   carvedilol (COREG) 12.5 MG tablet TAKE 1 TABLET 2 TIMES A DAY WITH A MEAL   ciclopirox (PENLAC) 8 % solution Apply topically at bedtime. Apply over nail and surrounding skin. Apply daily over previous coat. After seven (7) days, may remove with alcohol and continue cycle.   cloNIDine (CATAPRES) 0.3 MG tablet TAKE (1) TABLET TWICE A DAY for blood  pressure   clopidogrel (PLAVIX) 75 MG tablet TAKE ONE TABLET ONCE DAILY   Continuous Blood Gluc Receiver (FREESTYLE LIBRE 2 READER) DEVI UAD to monitor sugars continuously. E11.22   Continuous Blood Gluc Sensor (FREESTYLE LIBRE 2 SENSOR) MISC Use as directed to monitor sugars continuously. E11.22   dapagliflozin propanediol (FARXIGA) 10 MG TABS tablet Take 1 tablet (10 mg total) by mouth daily before breakfast.   furosemide (LASIX) 40 MG tablet Take 1 tablet (40 mg total) by mouth daily.   gabapentin (NEURONTIN) 100 MG capsule Take 1 capsule (100 mg total) by mouth 2 (two) times daily.   insulin degludec (TRESIBA FLEXTOUCH) 100 UNIT/ML FlexTouch Pen INJECT 38 UP TO 50 UNITS DAILY AS DIRECTED. (INCREASE BY 1 EVERY OTHER DAY UNTIL SUGAR BELOW 150.)   Insulin Pen Needle (PEN NEEDLES) 31G X 5 MM MISC Use daily with insulin Dx E11.9   isosorbide mononitrate (IMDUR) 60 MG 24 hr tablet TAKE 1 TABLET DAILY   losartan (COZAAR) 25 MG tablet Take 25 mg by mouth daily.   nitroGLYCERIN (NITROSTAT) 0.4 MG SL tablet Place 1 tablet (0.4 mg total) under the tongue every 5 (five) minutes as needed for chest pain.   No facility-administered encounter medications on file as of 03/15/2023.    Allergies (verified) Patient has no known allergies.   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   Chronic kidney disease  Complication of anesthesia    Hard to wake   Degenerative joint disease (DJD) of lumbar spine    Diabetes mellitus without complication (HCC)    Type II   Essential hypertension 02/23/2017   Lumbar stenosis    NSTEMI (non-ST elevated myocardial infarction) (HCC) 06/07/2021   Past Surgical History:  Procedure Laterality Date   AMPUTATION TOE Right 05/17/2020   Procedure: AMPUTATION TOE partial right great toe;  Surgeon: Park Liter, DPM;  Location: [facility] ORS;  Service: Podiatry;  Laterality: Right;   BELPHAROPTOSIS  REPAIR     CARDIAC CATHETERIZATION     CORONARY STENT INTERVENTION N/A 06/11/2021   Procedure: CORONARY STENT INTERVENTION;  Surgeon: Swaziland, Peter M, MD;  Location: [facility] INVASIVE CV LAB;  Service: Cardiovascular;  Laterality: N/A;   CORONARY ULTRASOUND/IVUS N/A 06/11/2021   Procedure: Intravascular Ultrasound/IVUS;  Surgeon: Swaziland, Peter M, MD;  Location: [facility] INVASIVE CV LAB;  Service: Cardiovascular;  Laterality: N/A;   EYE SURGERY     HEMORRHOID SURGERY     LEFT HEART CATH AND CORONARY ANGIOGRAPHY N/A 06/09/2021   Procedure: LEFT HEART CATH AND CORONARY ANGIOGRAPHY;  Surgeon: Swaziland, Peter M, MD;  Location: [facility] INVASIVE CV LAB;  Service: Cardiovascular;  Laterality: N/A;   LEFT HEART CATH AND CORONARY ANGIOGRAPHY N/A 07/23/2021   Procedure: LEFT HEART CATH AND CORONARY ANGIOGRAPHY;  Surgeon: Runell Gess, MD;  Location: [facility] INVASIVE CV LAB;  Service: Cardiovascular;  Laterality: N/A;   skin cancer removed right ear     TRANSFORAMINAL LUMBAR INTERBODY FUSION (TLIF) WITH PEDICLE SCREW FIXATION 2 LEVEL N/A 07/06/2018   Procedure: TRANSFORAMINAL LUMBAR INTERBODY FUSION (TLIF) L4-S1;  Surgeon: Venita Lick, MD;  Location: [facility] OR;  Service: Orthopedics;  Laterality: N/A;   Family History  Problem Relation Age of Onset   Heart attack Father 69       Died suddenly   Hypertension Father    Vision loss Sister        one eye   Cancer Brother        skin   Diabetes Brother    Social History   Socioeconomic History   Marital status: Married    Spouse name: Elease Hashimoto   Number of children: 3   Years of education: Not on file   Highest education level: Not on file  Occupational History   Occupation: retired    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 Has another son and one daughter    Raise cattle    Social Determinants of Health   Financial Resource Strain: Low Risk  (03/15/2023)   Overall Financial Resource Strain (CARDIA)    Difficulty of Paying Living Expenses: Not hard at all  Food Insecurity: No Food Insecurity (03/15/2023)   Hunger Vital Sign    Worried About Running Out of Food in the Last Year: Never true    Ran Out of Food in the Last Year: Never true  Transportation Needs: No Transportation  Needs (03/15/2023)   PRAPARE - Administrator, Civil Service (Medical): No    Lack of Transportation (Non-Medical): No  Physical Activity: Sufficiently Active (03/15/2023)   Exercise Vital Sign    Days of Exercise per Week: 5 days    Minutes of Exercise per Session: 60 min  Stress: No Stress Concern Present (03/15/2023)   [employer] of Occupational Health - Occupational Stress Questionnaire    Feeling of Stress : Not at all  Social Connections: Moderately Isolated (03/15/2023)   Social Connection and Isolation Panel [NHANES]    Frequency of Communication with Friends and Family: More than three times a week    Frequency of Social Gatherings with Friends and Family: More than three times a week    Attends Religious Services: Never    Database administrator or Organizations: No    Attends Engineer, structural: Never    Marital Status: Married    Tobacco Counseling Counseling given: Not Answered   Clinical Intake:  Pre-visit preparation completed: Yes  Pain : No/denies pain     Nutritional Risks: None Diabetes: Yes CBG done?: No Did pt. bring in CBG monitor from home?: No  How often do you need to have someone help you when you read instructions, pamphlets, or other written materials from your doctor or pharmacy?: 1 -  Never  Diabetic?yes  Nutrition Risk Assessment:  Has the patient had any N/V/D within the last 2 months?  No  Does the patient have any non-healing wounds?  No  Has the patient had any unintentional weight loss or weight gain?  No   Diabetes:  Is the patient diabetic?  Yes  If diabetic, was a CBG obtained today?  No  Did the patient bring in their glucometer from home?  No  How often do you monitor your CBG's? Daily .   Financial Strains and Diabetes Management:  Are you having any financial strains with the device, your supplies or your medication? No .  Does the patient want to be seen by Chronic Care Management for management of their diabetes?  No  Would the patient like to be referred to a Nutritionist or for Diabetic Management?  No   Diabetic Exams:  Diabetic Eye Exam: Completed 02/2023 Diabetic Foot Exam: Overdue, Pt has been advised about the importance in completing this exam. Pt is scheduled for diabetic foot exam on next office visit .   Interpreter Needed?: No  Information entered by :: Renie Ora, LPN   Activities of Daily Living    03/15/2023   10:30 AM  In your present state of health, do you have any difficulty performing the following activities:  Hearing? 0  Vision? 0  Difficulty concentrating or making decisions? 0  Walking or climbing stairs? 0  Dressing or bathing? 0  Doing errands, shopping? 0  Preparing Food and eating ? N  Using the Toilet? N  In the past six months, have you accidently leaked urine? N  Do you have problems with loss of bowel control? N  Managing your Medications? N  Managing your Finances? N  Housekeeping or managing your Housekeeping? N    Patient Care Team: [name], DO as PCP - General (Family Medicine) Rollene Rotunda, MD as PCP - Cardiology (Cardiology) Randa Lynn, MD as Consulting Physician (Nephrology) Bettey Costa, MD as Consulting Physician (Dermatology) Vivi Barrack, DPM as  Consulting Physician (Podiatry) Danella Maiers, [facility] as Pharmacist (Family Medicine) Edd Arbour  L, RN as [employer] trader, Debria Garret, MD as Consulting Physician (Orthopedic Surgery)  Indicate any recent Medical Services you may have received from other than [facility] providers in the past year (date may be approximate).     Assessment:   This is a routine wellness examination for [place].  Hearing/Vision screen Vision Screening - Comments:: Wears rx glasses - up to date with routine eye exams with  Dr.Johnson   Dietary issues and exercise activities discussed: Current Exercise Habits: Home exercise routine, Type of exercise: walking, Time (Minutes): 60, Frequency (Times/Week): 5, Weekly Exercise (Minutes/Week): 300, Intensity: Mild, Exercise limited by: None identified   Goals Addressed             This Visit's Progress    Exercise 150 min/wk Moderate Activity   On track      Depression Screen    03/15/2023   10:29 AM 01/31/2023    1:24 PM 09/17/2022   10:53 AM 08/24/2022    6:10 PM 08/03/2022   10:31 AM 07/13/2022   10:05 PM 06/16/2022   10:29 AM  PHQ 2/9 Scores  PHQ - 2 Score 0 0 0 0 0 0 0  PHQ- 9 Score 0 0         Fall Risk    03/15/2023   10:28 AM 01/31/2023    1:24 PM 01/03/2023   11:44 AM 09/17/2022   10:53 AM 07/13/2022   10:10 PM  Fall Risk   Falls in the past year? 0 0 0 0   Number falls in past yr: 0 0 0  0  Injury with Fall? 0 0 0  0  Risk for fall due to : No Fall Risks No Fall Risks No Fall Risks  No Fall Risks  Follow up Falls prevention discussed Education provided Falls evaluation completed  Falls evaluation completed    FALL RISK PREVENTION PERTAINING TO THE HOME:  Any stairs in or around the home? No  If so, are there any without handrails? No  Home free of loose throw rugs in walkways, pet beds, electrical cords, etc? Yes  Adequate lighting in your home to reduce risk of falls? Yes   ASSISTIVE DEVICES UTILIZED TO  PREVENT FALLS:  Life alert? No  Use of a cane, walker or w/c? No  Grab bars in the bathroom? Yes  Shower chair or bench in shower? Yes  Elevated toilet seat or a handicapped toilet? Yes       06/13/2018   11:37 AM 04/14/2017   10:41 AM  MMSE - Mini Mental State Exam  Not completed:  Unable to complete  Orientation to time 5 5  Orientation to Place 5 5  Registration 3 3  Attention/ Calculation 5 0  Recall 2 3  Language- name 2 objects 2 2  Language- repeat 1 1  Language- follow 3 step command 3 3  Language- read & follow direction 1 0  Write a sentence 0 0  Copy design 1 0  Total score 28 22        03/15/2023   10:30 AM 03/11/2022   10:44 AM 03/07/2020   11:00 AM 03/07/2020   10:48 AM  6CIT Screen  What Year? 0 points 0 points  0 points  What month? 0 points 0 points  0 points  What time? 0 points 0 points 0 points   Count back from 20 0 points 0 points 0 points   Months in reverse 0 points 2 points 2  points   Repeat phrase 0 points 2 points 4 points   Total Score 0 points 4 points      Immunizations Immunization History  Administered Date(s) Administered   Influenza,inj,Quad PF,6+ Mos 08/25/2017, 09/01/2018   Influenza,inj,quad, With Preservative 09/15/2017, 09/11/2018   Pneumococcal Polysaccharide-23 04/14/2017    TDAP status: Due, Education has been provided regarding the importance of this vaccine. Advised may receive this vaccine at local pharmacy or Health Dept. Aware to provide a copy of the vaccination record if obtained from local pharmacy or Health Dept. Verbalized acceptance and understanding.  Flu Vaccine status: Declined, Education has been provided regarding the importance of this vaccine but patient still declined. Advised may receive this vaccine at local pharmacy or Health Dept. Aware to provide a copy of the vaccination record if obtained from local pharmacy or Health Dept. Verbalized acceptance and understanding.  Pneumococcal vaccine status: Due,  Education has been provided regarding the importance of this vaccine. Advised may receive this vaccine at local pharmacy or Health Dept. Aware to provide a copy of the vaccination record if obtained from local pharmacy or Health Dept. Verbalized acceptance and understanding.  Covid-19 vaccine status: Completed vaccines  Qualifies for Shingles Vaccine? Yes   Zostavax completed No   Shingrix Completed?: No.    Education has been provided regarding the importance of this vaccine. Patient has been advised to call insurance company to determine out of pocket expense if they have not yet received this vaccine. Advised may also receive vaccine at local pharmacy or Health Dept. Verbalized acceptance and understanding.  Screening Tests Health Maintenance  Topic Date Due   OPHTHALMOLOGY EXAM  10/29/2022   Pneumonia Vaccine 25+ Years old (2 of 2 - PCV) 03/17/2023 (Originally 04/14/2018)   Zoster Vaccines- Shingrix (1 of 2) 05/03/2023 (Originally 01/11/1975)   Fecal DNA (Cologuard)  06/17/2023 (Originally 01/11/1999)   Diabetic kidney evaluation - Urine ACR  01/31/2028 (Originally 02/23/2018)   HEMOGLOBIN A1C  03/18/2023   INFLUENZA VACCINE  06/16/2023   Diabetic kidney evaluation - eGFR measurement  11/19/2023   FOOT EXAM  01/31/2024   Medicare Annual Wellness (AWV)  03/14/2024   Hepatitis C Screening  Completed   HPV VACCINES  Aged Out   DTaP/Tdap/Td  Discontinued   COVID-19 Vaccine  Discontinued    Health Maintenance  Health Maintenance Due  Topic Date Due   OPHTHALMOLOGY EXAM  10/29/2022    Colorectal cancer screening: Referral to GI placed 03/15/2023. Pt aware the office will call re: appt.  Lung Cancer Screening: (Low Dose CT Chest recommended if Age 1-80 years, 30 pack-year currently smoking OR have quit w/in 15years.) does not qualify.   Lung Cancer Screening Referral: n/a  Additional Screening:  Hepatitis C Screening: does not qualify; Completed 04/01/2020  Vision Screening:  Recommended annual ophthalmology exams for early detection of glaucoma and other disorders of the eye. Is the patient up to date with their annual eye exam?  Yes  Who is the provider or what is the name of the office in which the patient attends annual eye exams? Dr.Johnson  If pt is not established with a provider, would they like to be referred to a provider to establish care? No .   Dental Screening: Recommended annual dental exams for proper oral hygiene  Community Resource Referral / Chronic Care Management: CRR required this visit?  No   CCM required this visit?  No      Plan:     I have personally reviewed and  noted the following in the patient's chart:   Medical and social history Use of alcohol, tobacco or illicit drugs  Current medications and supplements including opioid prescriptions. Patient is not currently taking opioid prescriptions. Functional ability and status Nutritional status Physical activity Advanced directives List of other physicians Hospitalizations, surgeries, and ER visits in previous 12 months Vitals Screenings to include cognitive, depression, and falls Referrals and appointments  In addition, I have reviewed and discussed with patient certain preventive protocols, quality metrics, and best practice recommendations. A written personalized care plan for preventive services as well as general preventive health recommendations were provided to patient.     Lorrene Reid, LPN   [id number]/61/[id number]   Nurse Notes: Idell Pickles Yevonne Aline Vaccine

## 2023-03-29 ENCOUNTER — Other Ambulatory Visit: Payer: Self-pay | Admitting: Cardiology

## 2023-04-07 ENCOUNTER — Other Ambulatory Visit: Payer: Medicare HMO

## 2023-04-07 DIAGNOSIS — I129 Hypertensive chronic kidney disease with stage 1 through stage 4 chronic kidney disease, or unspecified chronic kidney disease: Secondary | ICD-10-CM | POA: Diagnosis not present

## 2023-04-07 DIAGNOSIS — E211 Secondary hyperparathyroidism, not elsewhere classified: Secondary | ICD-10-CM | POA: Diagnosis not present

## 2023-04-07 DIAGNOSIS — E1122 Type 2 diabetes mellitus with diabetic chronic kidney disease: Secondary | ICD-10-CM | POA: Diagnosis not present

## 2023-04-07 DIAGNOSIS — R809 Proteinuria, unspecified: Secondary | ICD-10-CM | POA: Diagnosis not present

## 2023-04-07 DIAGNOSIS — N189 Chronic kidney disease, unspecified: Secondary | ICD-10-CM | POA: Diagnosis not present

## 2023-04-07 DIAGNOSIS — Z79899 Other long term (current) drug therapy: Secondary | ICD-10-CM | POA: Diagnosis not present

## 2023-04-08 LAB — LAB REPORT - SCANNED
Creatinine, POC: 57.8 mg/dL
EGFR: 30

## 2023-04-12 DIAGNOSIS — D638 Anemia in other chronic diseases classified elsewhere: Secondary | ICD-10-CM | POA: Diagnosis not present

## 2023-04-12 DIAGNOSIS — R809 Proteinuria, unspecified: Secondary | ICD-10-CM | POA: Diagnosis not present

## 2023-04-12 DIAGNOSIS — E1122 Type 2 diabetes mellitus with diabetic chronic kidney disease: Secondary | ICD-10-CM | POA: Diagnosis not present

## 2023-04-12 DIAGNOSIS — I5033 Acute on chronic diastolic (congestive) heart failure: Secondary | ICD-10-CM | POA: Diagnosis not present

## 2023-04-14 ENCOUNTER — Encounter: Payer: Self-pay | Admitting: Gastroenterology

## 2023-04-14 ENCOUNTER — Ambulatory Visit: Payer: Medicare HMO | Admitting: Gastroenterology

## 2023-04-14 VITALS — BP 139/72 | HR 55 | Temp 97.9°F | Ht 72.0 in | Wt 219.0 lb

## 2023-04-14 DIAGNOSIS — Z1211 Encounter for screening for malignant neoplasm of colon: Secondary | ICD-10-CM

## 2023-04-14 NOTE — Progress Notes (Signed)
GI Office Note    Referring Provider: [name], DO Primary Care Physician:  [name], DO  Primary Gastroenterologist: Gerrit Friends.Rourk, MD  Chief Complaint   Chief Complaint  Patient presents with   Colonoscopy   History of Present Illness   [name] is a 67 y.o. male presenting today at the request of Delynn Flavin M, DO for screening colonoscopy.  Most recent labs 01/26/2023 with hemoglobin of 12.6.  MCV 87. Creatinine 2.54  EF 55-60% in September 2022.  Having issues with his feet and legs with swelling. Doctor has recently upped his fluid pill and feels as thought that is helping. Had MI in 2022 and had 3 stents placed and some other very small blockages they were unable to reach.   Takes tresiba 50 units once daily in the mornings. Also takes [place] daily. Blood sugars have been up and down depending on what he eats.   Has more constipation than he used to. Sometimes may go 2-3 days. States he drinks a fair amount of water. No diarrhea, melena, brbpr, abdominal pain, dysphagia, N/V. Rare acid reflux symptoms.   Had a colonoscopy over 10 years ago and was done in [place]. Possibly done at [facility]. Feels as though it was normal.   Current Outpatient Medications  Medication Sig Dispense Refill   amLODipine (NORVASC) 10 MG tablet Take 1 tablet (10 mg total) by mouth daily. 90 tablet 1   aspirin 81 MG EC tablet Take 1 tablet (81 mg total) by mouth daily. Swallow whole. 90 tablet 3   atorvastatin (LIPITOR) 80 MG tablet TAKE ONE TABLET ONCE DAILY 90 tablet 1   carvedilol (COREG) 12.5 MG tablet TAKE ONE TABLET TWICE DAILY WITH MEAL(S) 180 tablet 2   cloNIDine (CATAPRES) 0.3 MG tablet TAKE (1) TABLET TWICE A DAY for blood pressure 180 tablet 3   clopidogrel (PLAVIX) 75 MG tablet TAKE ONE TABLET ONCE DAILY 90 tablet 1   Continuous Blood Gluc Receiver (FREESTYLE LIBRE 2 READER) DEVI UAD to monitor sugars continuously. E11.22 1 each 0    Continuous Blood Gluc Sensor (FREESTYLE LIBRE 2 SENSOR) MISC Use as directed to monitor sugars continuously. E11.22 6 each 4   dapagliflozin propanediol (FARXIGA) 10 MG TABS tablet Take 1 tablet (10 mg total) by mouth daily before breakfast. 90 tablet 5   furosemide (LASIX) 40 MG tablet Take 1 tablet (40 mg total) by mouth daily. 90 tablet 3   gabapentin (NEURONTIN) 100 MG capsule Take 1 capsule (100 mg total) by mouth 2 (two) times daily. 180 capsule 3   insulin degludec (TRESIBA FLEXTOUCH) 100 UNIT/ML FlexTouch Pen INJECT 38 UP TO 50 UNITS DAILY AS DIRECTED. (INCREASE BY 1 EVERY OTHER DAY UNTIL SUGAR BELOW 150.) 15 mL 1   Insulin Pen Needle (PEN NEEDLES) 31G X 5 MM MISC Use daily with insulin Dx E11.9 100 each 3   isosorbide mononitrate (IMDUR) 60 MG 24 hr tablet TAKE 1 TABLET DAILY 90 tablet 2   losartan (COZAAR) 25 MG tablet Take 25 mg by mouth daily.     nitroGLYCERIN (NITROSTAT) 0.4 MG SL tablet Place 1 tablet (0.4 mg total) under the tongue every 5 (five) minutes as needed for chest pain. 25 tablet 3   No current facility-administered medications for this visit.    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   Chronic kidney disease    Complication of anesthesia  Hard to wake   Degenerative joint disease (DJD) of lumbar spine    Diabetes mellitus without complication (HCC)    Type II   Essential hypertension 02/23/2017   Lumbar stenosis    NSTEMI (non-ST elevated myocardial infarction) (HCC) 06/07/2021    Past Surgical History:  Procedure Laterality Date   AMPUTATION TOE Right 05/17/2020   Procedure: AMPUTATION TOE partial right great toe;  Surgeon: Park Liter, DPM;  Location: [facility] ORS;  Service: Podiatry;  Laterality: Right;   BELPHAROPTOSIS REPAIR     CARDIAC CATHETERIZATION     CORONARY STENT INTERVENTION N/A 06/11/2021   Procedure: CORONARY STENT INTERVENTION;  Surgeon: Swaziland, Peter M, MD;  Location: [facility]  INVASIVE CV LAB;  Service: Cardiovascular;  Laterality: N/A;   CORONARY ULTRASOUND/IVUS N/A 06/11/2021   Procedure: Intravascular Ultrasound/IVUS;  Surgeon: Swaziland, Peter M, MD;  Location: [facility] INVASIVE CV LAB;  Service: Cardiovascular;  Laterality: N/A;   EYE SURGERY     HEMORRHOID SURGERY     LEFT HEART CATH AND CORONARY ANGIOGRAPHY N/A 06/09/2021   Procedure: LEFT HEART CATH AND CORONARY ANGIOGRAPHY;  Surgeon: Swaziland, Peter M, MD;  Location: [facility] INVASIVE CV LAB;  Service: Cardiovascular;  Laterality: N/A;   LEFT HEART CATH AND CORONARY ANGIOGRAPHY N/A 07/23/2021   Procedure: LEFT HEART CATH AND CORONARY ANGIOGRAPHY;  Surgeon: Runell Gess, MD;  Location: [facility] INVASIVE CV LAB;  Service: Cardiovascular;  Laterality: N/A;   skin cancer removed right ear     TRANSFORAMINAL LUMBAR INTERBODY FUSION (TLIF) WITH PEDICLE SCREW FIXATION 2 LEVEL N/A 07/06/2018   Procedure: TRANSFORAMINAL LUMBAR INTERBODY FUSION (TLIF) L4-S1;  Surgeon: Venita Lick, MD;  Location: 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 Has another son and one daughter    Raise cattle    Social Determinants of Health   Financial Resource Strain: Low Risk  (03/15/2023)   Overall Financial Resource Strain (CARDIA)    Difficulty of Paying Living Expenses: Not hard at all  Food Insecurity: No Food Insecurity (03/15/2023)   Hunger Vital Sign    Worried About Running Out of Food in the Last Year: Never true    Ran Out of Food in the  Last Year: Never true  Transportation Needs: No Transportation Needs (03/15/2023)   PRAPARE - Administrator, Civil Service (Medical): No    Lack of Transportation (Non-Medical): No  Physical Activity: Sufficiently Active (03/15/2023)   Exercise Vital Sign    Days of Exercise per Week: 5 days    Minutes of Exercise per Session: 60 min  Stress: No Stress Concern Present (03/15/2023)   [employer] of Occupational Health - Occupational Stress Questionnaire    Feeling of Stress : Not at all  Social Connections: Moderately Isolated (03/15/2023)   Social Connection and Isolation Panel [NHANES]    Frequency of Communication with Friends and Family: More than three times a week    Frequency of Social Gatherings with Friends and Family: More than three times a week    Attends Religious Services: Never    Database administrator or Organizations: No    Attends Banker Meetings: Never    Marital Status: Married  Catering manager Violence: Not At Risk (03/15/2023)   Humiliation, Afraid, Rape, and Kick questionnaire    Fear of Current or Ex-Partner: No    Emotionally Abused: No    Physically Abused: No    Sexually Abused: No     Review of Systems   Gen: Denies any fever, chills, fatigue, weight loss, lack of appetite.  CV: + edema. Denies chest pain, heart palpitations, syncope.  Resp: Denies shortness of breath at rest or with exertion. Denies wheezing or cough.  GI: see HPI GU : Denies urinary burning, urinary  frequency, urinary hesitancy MS: Denies joint pain, muscle weakness, cramps, or limitation of movement.  Derm: Denies rash, itching, dry skin Psych: Denies depression, anxiety, memory loss, and confusion Heme: Denies bruising, bleeding, and enlarged lymph nodes.   Physical Exam   BP 139/72 (BP Location: Right Arm, Patient Position: Sitting, Cuff Size: Large)   Pulse (!) 55   Temp 97.9 F (36.6 C) (Oral)   Ht 6' (1.829 m)   Wt 219 lb (99.3 kg)   SpO2 98%   BMI 29.70 kg/m   General:   Alert and oriented. Pleasant and cooperative. Well-nourished and well-developed.  Head:  Normocephalic and atraumatic. Eyes:  Without icterus, sclera clear and conjunctiva pink.  Ears:  Normal auditory acuity. Mouth:  No deformity or lesions, oral mucosa pink.  Lungs:  Clear to auscultation bilaterally. No wheezes, rales, or rhonchi. No distress.  Heart:  S1, S2 present without murmurs appreciated.  Abdomen:  +BS, soft, non-tender and non-distended. No HSM noted. No guarding or rebound. No masses appreciated.  Rectal:  Deferred  Msk:  Symmetrical without gross deformities. Normal posture. Extremities:  Without edema. Neurologic:  Alert and  oriented x4;  grossly normal neurologically. Skin:  Intact without significant lesions or rashes. Psych:  Alert and cooperative. Normal mood and affect.   Assessment   [name] is a 67 y.o. male with a history of HTN, diabetes, CKD, acute combined systolic and diastolic heart failure, NSTEMI in 2022 s/p stent placement presenting today to schedule routine colonoscopy.  Screening for colon cancer: No alarm symptoms present.  History of a colonoscopy greater than 10 years ago, possibly performed at [facility] however patient and wife is unclear exactly where or who performed the colonoscopy.  He reported as being normal.  Appears to be stable from a cardiac standpoint to undergo procedure.  He follows with cardiology and recently did have his fluid pill  increased due to some  mild increase in peripheral edema.  Last EF on echocardiogram was 55-60%.   PLAN   Proceed with colonoscopy with propofol by Dr. Jena Gauss in near future: the risks, benefits, and alternatives have been discussed with the patient in detail. The patient states understanding and desires to proceed. ASA 3 TriLyte prep [place] enema Hold Farxiga for 3 days Hold Tresiba the morning of May use miralax as needed for intermittent constipation   Brooke Bonito, MSN, FNP-BC, AGACNP-BC [facility]

## 2023-04-14 NOTE — Patient Instructions (Addendum)
We discussed the need for a colonoscopy in the near future with Dr. Jena Gauss.  You will receive separate written detailed instructions regarding your prep and which medications to hold and when.  In summary you will need to hold your Evaristo Bury the morning of your procedure and your Marcelline Deist for 3 days prior.  Please notify our office between now and time of your procedure if you have any medication changes.  You may use MiraLAX as needed for your intermittent constipation if you do not have a bowel movement after 3-4 days.  It was a pleasure to see you today. I want to create trusting relationships with patients. If you receive a survey regarding your visit,  I greatly appreciate you taking time to fill this out on paper or through your MyChart. I value your feedback.  Brooke Bonito, MSN, FNP-BC, AGACNP-BC [facility]

## 2023-04-19 ENCOUNTER — Telehealth: Payer: Self-pay | Admitting: *Deleted

## 2023-04-19 NOTE — Telephone Encounter (Signed)
Attempted to contact pt to schedule colonoscopy. Mail box is full, unable to leave message.  TCS w/Dr.Rourk, asa 3

## 2023-04-20 ENCOUNTER — Encounter: Payer: Self-pay | Admitting: *Deleted

## 2023-04-20 ENCOUNTER — Other Ambulatory Visit: Payer: Self-pay | Admitting: *Deleted

## 2023-04-20 MED ORDER — PEG 3350-KCL-NA BICARB-NACL 420 G PO SOLR: 4000.0000 mL | Freq: Once | ORAL | 0 refills | Status: AC

## 2023-04-20 NOTE — Telephone Encounter (Signed)
Pt has been scheduled for 05/18/23. Instructions mailed and prep sent to the pharmacy.

## 2023-05-03 ENCOUNTER — Other Ambulatory Visit: Payer: Self-pay | Admitting: Family Medicine

## 2023-05-03 ENCOUNTER — Ambulatory Visit (INDEPENDENT_AMBULATORY_CARE_PROVIDER_SITE_OTHER): Payer: Medicare HMO | Admitting: Family Medicine

## 2023-05-03 ENCOUNTER — Encounter: Payer: Self-pay | Admitting: Family Medicine

## 2023-05-03 VITALS — BP 113/66 | HR 57 | Temp 98.0°F | Ht 72.0 in | Wt 220.0 lb

## 2023-05-03 DIAGNOSIS — I152 Hypertension secondary to endocrine disorders: Secondary | ICD-10-CM | POA: Diagnosis not present

## 2023-05-03 DIAGNOSIS — E1122 Type 2 diabetes mellitus with diabetic chronic kidney disease: Secondary | ICD-10-CM

## 2023-05-03 DIAGNOSIS — N1832 Chronic kidney disease, stage 3b: Secondary | ICD-10-CM

## 2023-05-03 DIAGNOSIS — E785 Hyperlipidemia, unspecified: Secondary | ICD-10-CM

## 2023-05-03 DIAGNOSIS — Z89419 Acquired absence of unspecified great toe: Secondary | ICD-10-CM | POA: Diagnosis not present

## 2023-05-03 DIAGNOSIS — Z125 Encounter for screening for malignant neoplasm of prostate: Secondary | ICD-10-CM | POA: Diagnosis not present

## 2023-05-03 DIAGNOSIS — E1159 Type 2 diabetes mellitus with other circulatory complications: Secondary | ICD-10-CM | POA: Diagnosis not present

## 2023-05-03 DIAGNOSIS — E1169 Type 2 diabetes mellitus with other specified complication: Secondary | ICD-10-CM | POA: Diagnosis not present

## 2023-05-03 DIAGNOSIS — D72829 Elevated white blood cell count, unspecified: Secondary | ICD-10-CM | POA: Diagnosis not present

## 2023-05-03 DIAGNOSIS — Z794 Long term (current) use of insulin: Secondary | ICD-10-CM

## 2023-05-03 DIAGNOSIS — E1140 Type 2 diabetes mellitus with diabetic neuropathy, unspecified: Secondary | ICD-10-CM | POA: Diagnosis not present

## 2023-05-03 LAB — CBC
MCHC: 34.6 g/dL (ref 31.5–35.7)
MCV: 88 fL (ref 79–97)
RDW: 13.7 % (ref 11.6–15.4)
WBC: 11.9 10*3/uL — ABNORMAL HIGH (ref 3.4–10.8)

## 2023-05-03 LAB — PSA

## 2023-05-03 LAB — BAYER DCA HB A1C WAIVED: HB A1C (BAYER DCA - WAIVED): 6.1 % — ABNORMAL HIGH (ref 4.8–5.6)

## 2023-05-03 MED ORDER — CLONIDINE HCL 0.3 MG PO TABS: ORAL_TABLET | ORAL | 3 refills | Status: DC

## 2023-05-03 MED ORDER — GABAPENTIN 100 MG PO CAPS: 100.0000 mg | ORAL_CAPSULE | Freq: Two times a day (BID) | ORAL | 3 refills | Status: DC

## 2023-05-03 MED ORDER — TRESIBA FLEXTOUCH 100 UNIT/ML ~~LOC~~ SOPN: PEN_INJECTOR | SUBCUTANEOUS | 3 refills | Status: DC

## 2023-05-03 MED ORDER — ALPHA-LIPOIC ACID 600 MG PO CAPS: 600.0000 mg | ORAL_CAPSULE | Freq: Every morning | ORAL | 3 refills | Status: DC

## 2023-05-03 NOTE — Progress Notes (Signed)
Subjective: CC:DM PCP: [name], DO WUJ:[id number] [name] is a 67 y.o. male presenting to clinic today for:  1. Type 2 Diabetes with hypertension, hyperlipidemia:  Last A1c 7.1 in March.  He reports compliance with medications.  He does get slightly sleepy sometimes.  He is taking the gabapentin 1 in the morning, 1 in the evening and this helps some with his neuropathy but sometimes he has to stabilize himself because the neuropathy is so bad.  He does not want to go up on the medication 1 to make mention of this.  He denies any dysuria, hematuria, penile or genitourinary symptoms  Last eye exam: needs Last foot exam: UTD Last A1c:  Lab Results  Component Value Date   HGBA1C 7.5 (H) 09/17/2022   Nephropathy screen indicated?: UTD, done with Dr Wolfgang Phoenix. 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 Swallow whole., Disp: 90 tablet, Rfl: 3   atorvastatin (LIPITOR) 80 MG tablet, TAKE ONE TABLET ONCE DAILY, Disp: 90 tablet, Rfl: 1   carvedilol (COREG) 12.5 MG tablet, TAKE ONE TABLET TWICE DAILY WITH MEAL(S),  Disp: 180 tablet, Rfl: 2   cloNIDine (CATAPRES) 0.3 MG tablet, TAKE (1) TABLET TWICE A DAY for blood pressure, Disp: 180 tablet, Rfl: 3   clopidogrel (PLAVIX) 75 MG tablet, TAKE ONE TABLET ONCE DAILY, Disp: 90 tablet, Rfl: 1   Continuous Blood Gluc Receiver (FREESTYLE LIBRE 2 READER) DEVI, UAD to monitor sugars continuously. E11.22, Disp: 1 each, Rfl: 0   Continuous Blood Gluc Sensor (FREESTYLE LIBRE 2 SENSOR) MISC, Use as directed to monitor sugars continuously. E11.22, Disp: 6 each, Rfl: 4   dapagliflozin propanediol (FARXIGA) 10 MG TABS tablet, Take 1 tablet (10 mg total) by mouth daily before breakfast., Disp: 90 tablet, Rfl: 5   furosemide (LASIX) 40 MG tablet, Take 1 tablet (40 mg total) by mouth daily., Disp: 90 tablet, Rfl: 3   gabapentin (NEURONTIN) 100 MG capsule, Take 1 capsule (100 mg total) by mouth 2 (two) times daily., Disp: 180 capsule, Rfl: 3   insulin degludec (TRESIBA FLEXTOUCH) 100 UNIT/ML FlexTouch Pen, INJECT 38 UP TO 50 UNITS DAILY AS DIRECTED. (INCREASE BY 1 EVERY OTHER DAY UNTIL SUGAR BELOW 150.), Disp: 15 mL, Rfl: 1   Insulin Pen Needle (PEN NEEDLES) 31G X 5 MM MISC, Use daily with insulin Dx E11.9, Disp: 100 each, Rfl: 3   isosorbide mononitrate (IMDUR) 60 MG 24 hr tablet, TAKE 1 TABLET DAILY, Disp: 90 tablet, Rfl: 2   losartan (COZAAR) 25 MG tablet, Take 25 mg by mouth daily., Disp: , Rfl:    nitroGLYCERIN (NITROSTAT) 0.4 MG  SL tablet, Place 1 tablet (0.4 mg total) under the tongue every 5 (five) minutes as needed for chest pain., Disp: 25 tablet, Rfl: 3 Social History   Socioeconomic History   Marital status: Married    Spouse name: Elease Hashimoto   Number of children: 3   Years of education: Not on file   Highest education level: Not on file  Occupational History   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 Has another son and one daughter    Raise cattle    Social Determinants of Health   Financial Resource Strain: Low Risk  (03/15/2023)   Overall Financial Resource Strain (CARDIA)    Difficulty of Paying Living Expenses: Not hard at all  Food Insecurity: No Food Insecurity (03/15/2023)   Hunger Vital Sign    Worried About Running Out of Food in the Last Year: Never true    Ran Out of Food in the Last Year: Never true  Transportation Needs: No Transportation Needs (03/15/2023)   PRAPARE - Administrator, Civil Service (Medical): No    Lack of Transportation (Non-Medical): No  Physical Activity: Sufficiently Active (03/15/2023)   Exercise Vital Sign    Days of Exercise per Week: 5 days    Minutes of Exercise per Session: 60 min  Stress: No Stress Concern Present (03/15/2023)   [employer] of Occupational Health - Occupational Stress Questionnaire    Feeling of Stress : Not at all  Social Connections: Moderately Isolated (03/15/2023)   Social Connection and Isolation Panel [NHANES]    Frequency of Communication with Friends and Family: More than three times a week    Frequency of Social Gatherings with Friends and Family: More than three times a week    Attends Religious Services: Never    Database administrator or Organizations: No    Attends Banker Meetings: Never    Marital Status: Married  Catering manager Violence: Not At Risk (03/15/2023)   Humiliation, Afraid, Rape, and Kick questionnaire    Fear of Current or Ex-Partner: No    Emotionally Abused: No    Physically Abused: No    Sexually Abused: No   Family History  Problem Relation Age of Onset   Heart attack Father 58       Died  suddenly   Hypertension Father    Vision loss Sister        one eye   Cancer Brother        skin   Diabetes Brother     Objective: Office vital signs reviewed. BP 113/66   Pulse (!) 57   Temp 98 F (36.7 C)   Ht 6' (1.829 m)   Wt 220 lb (99.8 kg)   SpO2 98%   BMI 29.84 kg/m   Physical Examination:  General: Awake, alert, well nourished, No acute distress HEENT: Sclera white, MMM Cardio: regular rate and rhythm, S1S2 heard, no  murmurs appreciated Pulm: clear to auscultation bilaterally, no wheezes, rhonchi or rales; normal work of breathing on room air MSK: Normal gait and station  Assessment/ Plan: 67 y.o. male   Type 2 diabetes mellitus with stage 3b chronic kidney disease, without long-term current use of insulin (HCC) - Plan: Bayer DCA Hb A1c Waived, insulin degludec (TRESIBA FLEXTOUCH) 100 UNIT/ML FlexTouch Pen  Neuropathy due to type 2 diabetes mellitus (HCC) - Plan: Alpha-Lipoic Acid 600 MG CAPS, gabapentin (NEURONTIN) 100 MG capsule  History of amputation of great toe (HCC)  Hyperlipidemia associated with type 2 diabetes mellitus (HCC)  Hypertension associated with diabetes (HCC) - Plan: cloNIDine (CATAPRES) 0.3 MG tablet  Leukocytosis, unspecified type - Plan: CBC  Screening for malignant neoplasm of prostate - Plan: PSA  Sugar now under excellent control at 6.1.  No changes to current regimen.  I have renewed his medications.  I will reach out to CCM to make sure that he has updated PAP for Farxiga  Alpha lipoic acid added.  I would like him to try utilizing this in the morning and reserving his gabapentin for later in the evening.  He operates heavy machinery on the farm and I worry about him feeling overly sedated  Not due for diabetic foot exam but has history of amputation of great toe.  Plan for repeat diabetic foot exam at next visit in 4 to 6 months  Continue cholesterol medications.  Not yet due for lipid  Clonidine renewed for blood pressure  control which is controlled during today's visit  Recheck CBC given mild elevation in white blood cell count on last draw and also do screening PSA today  No orders of the defined types were placed in this encounter.  No orders of the defined types were placed in this encounter.    [name], DO [facility] [phone]

## 2023-05-04 LAB — CBC
Hematocrit: 35.8 % — ABNORMAL LOW (ref 37.5–51.0)
Hemoglobin: 12.4 g/dL — ABNORMAL LOW (ref 13.0–17.7)
MCH: 30.4 pg (ref 26.6–33.0)
Platelets: 172 10*3/uL (ref 150–450)
RBC: 4.08 x10E6/uL — ABNORMAL LOW (ref 4.14–5.80)

## 2023-05-05 ENCOUNTER — Ambulatory Visit: Payer: Medicare HMO

## 2023-05-12 NOTE — Patient Instructions (Signed)
   Your procedure is scheduled on: 05/18/2023  Report to [facility] Main Entrance at   12:00  PM.  Call this number if you have problems the morning of surgery: [phone]   Remember:              Follow Directions on the letter you received from Your Physician's office regarding the Bowel Prep              No Smoking the day of Procedure :   Take these medicines the morning of surgery with A SIP OF WATER:Amlodipine, coreg, clonidine, gabapentin, and isosorbide   Use inhalers if needed  No diabetic medication am of procedure   Do not wear jewelry, make-up or nail polish.    Do not bring valuables to the hospital.  Contacts, dentures or bridgework may not be worn into surgery.  .   Patients discharged the day of surgery will not be allowed to drive home.     Colonoscopy, Adult, Care After This sheet gives you information about how to care for yourself after your procedure. Your health care provider may also give you more specific instructions. If you have problems or questions, contact your health care provider. What can I expect after the procedure? After the procedure, it is common to have: A small amount of blood in your stool for 24 hours after the procedure. Some gas. Mild abdominal cramping or bloating.  Follow these instructions at home: General instructions  For the first 24 hours after the procedure: Do not drive or use machinery. Do not sign important documents. Do not drink alcohol. Do your regular daily activities at a slower pace than normal. Eat soft, easy-to-digest foods. Rest often. Take over-the-counter or prescription medicines only as told by your health care provider. It is up to you to get the results of your procedure. Ask your health care provider, or the department performing the procedure, when your results will be ready. Relieving cramping and bloating Try walking around when you have cramps or feel bloated. Apply heat to your abdomen as told by  your health care provider. Use a heat source that your health care provider recommends, such as a moist heat pack or a heating pad. Place a towel between your skin and the heat source. Leave the heat on for 20-30 minutes. Remove the heat if your skin turns bright red. This is especially important if you are unable to feel pain, heat, or cold. You may have a greater risk of getting burned. Eating and drinking Drink enough fluid to keep your urine clear or pale yellow. Resume your normal diet as instructed by your health care provider. Avoid heavy or fried foods that are hard to digest. Avoid drinking alcohol for as long as instructed by your health care provider. Contact a health care provider if: You have blood in your stool 2-3 days after the procedure. Get help right away if: You have more than a small spotting of blood in your stool. You pass large blood clots in your stool. Your abdomen is swollen. You have nausea or vomiting. You have a fever. You have increasing abdominal pain that is not relieved with medicine. This information is not intended to replace advice given to you by your health care provider. Make sure you discuss any questions you have with your health care provider. Document Released: 06/15/2004 Document Revised: 07/26/2016 Document Reviewed: 01/13/2016 Elsevier Interactive Patient Education  [employer].

## 2023-05-16 ENCOUNTER — Encounter (HOSPITAL_COMMUNITY)
Admission: RE | Admit: 2023-05-16 | Discharge: 2023-05-16 | Disposition: A | Discharge status: Home / Self Care | Payer: Medicare HMO | Source: Ambulatory Visit | Attending: Internal Medicine | Admitting: Internal Medicine

## 2023-05-16 ENCOUNTER — Encounter (HOSPITAL_COMMUNITY): Payer: Self-pay

## 2023-05-16 VITALS — BP 113/66 | HR 57 | Temp 98.0°F | Resp 18 | Ht 72.0 in | Wt 220.0 lb

## 2023-05-16 DIAGNOSIS — Z01812 Encounter for preprocedural laboratory examination: Secondary | ICD-10-CM | POA: Diagnosis not present

## 2023-05-16 DIAGNOSIS — N1832 Chronic kidney disease, stage 3b: Secondary | ICD-10-CM | POA: Insufficient documentation

## 2023-05-16 HISTORY — DX: Sleep apnea, unspecified: G47.30

## 2023-05-16 LAB — BASIC METABOLIC PANEL
Anion gap: 9 (ref 5–15)
BUN: 30 mg/dL — ABNORMAL HIGH (ref 8–23)
CO2: 23 mmol/L (ref 22–32)
Calcium: 8.6 mg/dL — ABNORMAL LOW (ref 8.9–10.3)
Chloride: 103 mmol/L (ref 98–111)
Creatinine, Ser: 2.18 mg/dL — ABNORMAL HIGH (ref 0.61–1.24)
GFR, Estimated: 32 mL/min — ABNORMAL LOW (ref >60)
Glucose, Bld: 178 mg/dL — ABNORMAL HIGH (ref 70–99)
Potassium: 3.3 mmol/L — ABNORMAL LOW (ref 3.5–5.1)
Sodium: 135 mmol/L (ref 135–145)

## 2023-05-18 ENCOUNTER — Ambulatory Visit (HOSPITAL_COMMUNITY)
Admission: RE | Admit: 2023-05-18 | Discharge: 2023-05-18 | Disposition: A | Discharge status: Home / Self Care | Payer: Medicare HMO | Attending: Internal Medicine | Admitting: Internal Medicine

## 2023-05-18 ENCOUNTER — Ambulatory Visit (HOSPITAL_COMMUNITY): Payer: Medicare HMO | Admitting: Certified Registered"

## 2023-05-18 ENCOUNTER — Encounter (HOSPITAL_COMMUNITY): Payer: Self-pay | Admitting: Internal Medicine

## 2023-05-18 ENCOUNTER — Encounter (HOSPITAL_COMMUNITY)
Admission: RE | Disposition: A | Discharge status: Home / Self Care | Payer: Self-pay | Source: Home / Self Care | Attending: Internal Medicine

## 2023-05-18 DIAGNOSIS — Z7902 Long term (current) use of antithrombotics/antiplatelets: Secondary | ICD-10-CM | POA: Diagnosis not present

## 2023-05-18 DIAGNOSIS — Z794 Long term (current) use of insulin: Secondary | ICD-10-CM | POA: Insufficient documentation

## 2023-05-18 DIAGNOSIS — Z1211 Encounter for screening for malignant neoplasm of colon: Secondary | ICD-10-CM | POA: Diagnosis not present

## 2023-05-18 DIAGNOSIS — Z955 Presence of coronary angioplasty implant and graft: Secondary | ICD-10-CM | POA: Insufficient documentation

## 2023-05-18 DIAGNOSIS — E1122 Type 2 diabetes mellitus with diabetic chronic kidney disease: Secondary | ICD-10-CM | POA: Diagnosis not present

## 2023-05-18 DIAGNOSIS — N1832 Chronic kidney disease, stage 3b: Secondary | ICD-10-CM

## 2023-05-18 DIAGNOSIS — Z7984 Long term (current) use of oral hypoglycemic drugs: Secondary | ICD-10-CM | POA: Diagnosis not present

## 2023-05-18 DIAGNOSIS — N189 Chronic kidney disease, unspecified: Secondary | ICD-10-CM | POA: Diagnosis not present

## 2023-05-18 DIAGNOSIS — I130 Hypertensive heart and chronic kidney disease with heart failure and stage 1 through stage 4 chronic kidney disease, or unspecified chronic kidney disease: Secondary | ICD-10-CM

## 2023-05-18 DIAGNOSIS — I25119 Atherosclerotic heart disease of native coronary artery with unspecified angina pectoris: Secondary | ICD-10-CM

## 2023-05-18 DIAGNOSIS — Z538 Procedure and treatment not carried out for other reasons: Secondary | ICD-10-CM | POA: Diagnosis not present

## 2023-05-18 DIAGNOSIS — I509 Heart failure, unspecified: Secondary | ICD-10-CM | POA: Diagnosis not present

## 2023-05-18 DIAGNOSIS — E1151 Type 2 diabetes mellitus with diabetic peripheral angiopathy without gangrene: Secondary | ICD-10-CM | POA: Insufficient documentation

## 2023-05-18 DIAGNOSIS — I252 Old myocardial infarction: Secondary | ICD-10-CM | POA: Diagnosis not present

## 2023-05-18 DIAGNOSIS — I5042 Chronic combined systolic (congestive) and diastolic (congestive) heart failure: Secondary | ICD-10-CM | POA: Insufficient documentation

## 2023-05-18 DIAGNOSIS — I129 Hypertensive chronic kidney disease with stage 1 through stage 4 chronic kidney disease, or unspecified chronic kidney disease: Secondary | ICD-10-CM | POA: Diagnosis not present

## 2023-05-18 DIAGNOSIS — G4730 Sleep apnea, unspecified: Secondary | ICD-10-CM | POA: Insufficient documentation

## 2023-05-18 HISTORY — PX: FLEXIBLE SIGMOIDOSCOPY: SHX5431

## 2023-05-18 LAB — GLUCOSE, CAPILLARY: Glucose-Capillary: 105 mg/dL — ABNORMAL HIGH (ref 70–99)

## 2023-05-18 SURGERY — SIGMOIDOSCOPY, FLEXIBLE
Anesthesia: General

## 2023-05-18 MED ORDER — LIDOCAINE HCL (CARDIAC) PF 100 MG/5ML IV SOSY
PREFILLED_SYRINGE | INTRAVENOUS | Status: DC | PRN
  Administered 2023-05-18: 80 mg via INTRAVENOUS

## 2023-05-18 MED ORDER — LACTATED RINGERS IV SOLN: INTRAVENOUS | Status: DC | PRN

## 2023-05-18 MED ORDER — PROPOFOL 500 MG/50ML IV EMUL
INTRAVENOUS | Status: AC
  Filled 2023-05-18: qty 50

## 2023-05-18 MED ORDER — LIDOCAINE HCL (PF) 2 % IJ SOLN
INTRAMUSCULAR | Status: AC
  Filled 2023-05-18: qty 5

## 2023-05-18 MED ORDER — PROPOFOL 500 MG/50ML IV EMUL
INTRAVENOUS | Status: DC | PRN
  Administered 2023-05-18: 150 ug/kg/min via INTRAVENOUS

## 2023-05-18 MED ORDER — LACTATED RINGERS IV SOLN: INTRAVENOUS | Status: DC

## 2023-05-18 MED ORDER — PROPOFOL 10 MG/ML IV BOLUS
INTRAVENOUS | Status: DC | PRN
  Administered 2023-05-18: 100 mg via INTRAVENOUS

## 2023-05-18 NOTE — Anesthesia Preprocedure Evaluation (Signed)
Anesthesia Evaluation  Patient identified by MRN, date of birth, ID band Patient awake    Reviewed: Allergy & Precautions, H&P , NPO status , Patient's Chart, lab work & pertinent test results, reviewed documented beta blocker date and time   History of Anesthesia Complications (+) PROLONGED EMERGENCE and history of anesthetic complications  Airway Mallampati: II  TM Distance: >3 FB Neck ROM: full    Dental no notable dental hx.    Pulmonary neg pulmonary ROS, sleep apnea    Pulmonary exam normal breath sounds clear to auscultation       Cardiovascular Exercise Tolerance: Good hypertension, + angina  + CAD, + Past MI, + Peripheral Vascular Disease and +CHF  negative cardio ROS  Rhythm:regular Rate:Normal     Neuro/Psych negative neurological ROS  negative psych ROS   GI/Hepatic negative GI ROS, Neg liver ROS,,,  Endo/Other  negative endocrine ROSdiabetes    Renal/GU CRFRenal diseasenegative Renal ROS  negative genitourinary   Musculoskeletal   Abdominal   Peds  Hematology negative hematology ROS (+)   Anesthesia Other Findings   Reproductive/Obstetrics negative OB ROS                             Anesthesia Physical Anesthesia Plan  ASA: 3  Anesthesia Plan: General   Post-op Pain Management:    Induction:   PONV Risk Score and Plan: Propofol infusion  Airway Management Planned:   Additional Equipment:   Intra-op Plan:   Post-operative Plan:   Informed Consent: I have reviewed the patients History and Physical, chart, labs and discussed the procedure including the risks, benefits and alternatives for the proposed anesthesia with the patient or authorized representative who has indicated his/her understanding and acceptance.     Dental Advisory Given  Plan Discussed with: CRNA  Anesthesia Plan Comments:        Anesthesia Quick Evaluation

## 2023-05-18 NOTE — Op Note (Signed)
[facility] Patient Name: [name] Procedure Date: 05/18/2023 1:54 PM MRN: [id number] Date of Birth: [birth year] Attending MD: Gennette Pac , MD, [id number] CSN: [id number] Age: 67 Admit Type: Outpatient Procedure:                Sigmoidoscopy (attempted colonoscopy) Indications:              Screening for colorectal malignant neoplasm Providers:                Gennette Pac, MD, Crystal Page, Lennice Sites                            Technician, Technician Referring MD:              Medicines:                Propofol per Anesthesia Complications:            No immediate complications. Estimated Blood Loss:     Estimated blood loss: none. Procedure:                After obtaining informed consent, the colonoscope                            was passed under direct vision. Throughout the                            procedure, the patient's blood pressure, pulse, and                            oxygen saturations were monitored continuously. The                            [id number]) scope was introduced through the                            anus with the intention of advancing to the cecum..                            The scope was advanced to the sigmoid colon before                            the procedure was aborted. Medications were given.                            Formed and semiformed stool in the rectum and                            distal sigmoid precluded completion of examination                            today. Scope In: 2:23:46 PM Scope Out: 2:26:27 PM Total Procedure Duration: 0 hours 2 minutes 41 seconds  Findings:      The perianal and digital rectal examinations were normal. Endoscopic       findings. Prep was inadequate. Formed and semiformed stool in the rectum       trailing up  in the sigmoid precluded examination today. Procedure       aborted. Impression:               - Prep inadequate for colonoscopy today. Moderate Sedation:       Moderate (conscious) sedation was personally administered by an       anesthesia professional. The following parameters were monitored: oxygen       saturation, heart rate, blood pressure, respiratory rate, EKG, adequacy       of pulmonary ventilation, and response to care. Recommendation:           - Patient has a contact number available for                            emergencies. The signs and symptoms of potential                            delayed complications were discussed with the                            patient. Return to normal activities tomorrow.                            Written discharge instructions were provided to the                            patient.                           - Advance diet as tolerated. Return to the office                            next available appointment for further                            recommendations. Procedure Code(s):        --- Professional ---                           [phone], 53, Colonoscopy, flexible; diagnostic,                            including collection of specimen(s) by brushing or                            washing, when performed (separate procedure) Diagnosis Code(s):        --- Professional ---                           Z12.11, Encounter for screening for malignant                            neoplasm of colon CPT copyright 2022 American Medical Association. All rights reserved. The codes documented in this report are preliminary and upon coder review may  be revised to meet current compliance requirements. Gerrit Friends. Clarivel Callaway, MD Gennette Pac, MD 05/18/2023 2:36:05 PM This report has been signed electronically. Number  of Addenda: 0

## 2023-05-18 NOTE — Transfer of Care (Addendum)
Immediate Anesthesia Transfer of Care Note  Patient: [name]  Procedure(s) Performed: FLEXIBLE SIGMOIDOSCOPY  Patient Location: Short Stay  Anesthesia Type:General  Level of Consciousness: drowsy and patient cooperative  Airway & Oxygen Therapy: Patient Spontanous Breathing  Post-op Assessment: Report given to RN and Post -op Vital signs reviewed and stable  Post vital signs: Reviewed and stable    Last Vitals:  Vitals Value Taken Time  BP 136/68 05/18/23   1428  Temp 36.6 05/18/23   1428  Pulse 58 05/18/23   1428  Resp 10 05/18/23   1428  SpO2 99% 05/18/23   1428    Last Pain:  Vitals:   05/18/23 1418  PainSc: 0-No pain         Complications: No notable events documented.

## 2023-05-18 NOTE — H&P (Signed)
@LOGO @   Primary Care Physician:  [name], DO Primary Gastroenterologist:  Dr. Jena Gauss  Pre-Procedure History & Physical: HPI:  [name] is a 67 y.o. male is here for a screening colonoscopy.  average risk screening examination last colonoscopy elsewhere over 10 years ago with apparent negative findings.  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   Chronic kidney disease    Complication of anesthesia    Hard to wake   Degenerative joint disease (DJD) of lumbar spine    Diabetes mellitus without complication (HCC)    Type II   Essential hypertension 02/23/2017   Lumbar stenosis    NSTEMI (non-ST elevated myocardial infarction) (HCC) 06/07/2021   Sleep apnea     Past Surgical History:  Procedure Laterality Date   AMPUTATION TOE Right 05/17/2020   Procedure: AMPUTATION TOE partial right great toe;  Surgeon: Park Liter, DPM;  Location: [facility] ORS;  Service: Podiatry;  Laterality: Right;   BELPHAROPTOSIS REPAIR     CARDIAC CATHETERIZATION     CORONARY STENT INTERVENTION N/A 06/11/2021   Procedure: CORONARY STENT INTERVENTION;  Surgeon: Swaziland, Peter M, MD;  Location: [facility] INVASIVE CV LAB;  Service: Cardiovascular;  Laterality: N/A;   CORONARY ULTRASOUND/IVUS N/A 06/11/2021   Procedure: Intravascular Ultrasound/IVUS;  Surgeon: Swaziland, Peter M, MD;  Location: [facility] INVASIVE CV LAB;  Service: Cardiovascular;  Laterality: N/A;   EYE SURGERY     HEMORRHOID SURGERY     LEFT HEART CATH AND CORONARY ANGIOGRAPHY N/A 06/09/2021   Procedure: LEFT HEART CATH AND CORONARY ANGIOGRAPHY;  Surgeon: Swaziland, Peter M, MD;  Location: [facility] INVASIVE CV LAB;  Service: Cardiovascular;  Laterality: N/A;   LEFT HEART CATH AND CORONARY ANGIOGRAPHY N/A 07/23/2021   Procedure: LEFT HEART CATH AND CORONARY ANGIOGRAPHY;  Surgeon: Runell Gess, MD;  Location: [facility] INVASIVE CV LAB;  Service: Cardiovascular;  Laterality: N/A;   skin  cancer removed right ear     TRANSFORAMINAL LUMBAR INTERBODY FUSION (TLIF) WITH PEDICLE SCREW FIXATION 2 LEVEL N/A 07/06/2018   Procedure: TRANSFORAMINAL LUMBAR INTERBODY FUSION (TLIF) L4-S1;  Surgeon: Venita Lick, MD;  Location: [facility];  Service: Orthopedics;  Laterality: N/A;    Prior to Admission medications   Medication Sig Start Date End Date Taking? Authorizing Provider  Alpha-Lipoic Acid 600 MG CAPS Take 1 capsule (600 mg total) by mouth in the morning. For neuropathy 05/03/23  Yes Gottschalk, Ashly M, DO  amLODipine (NORVASC) 10 MG tablet Take 1 tablet (10 mg total) by mouth daily. 07/25/21  Yes Cyndi Bender, NP  aspirin 81 MG EC tablet Take 1 tablet (81 mg total) by mouth daily. Swallow whole. 06/13/21  Yes Kroeger, Dot Lanes M., PA-C  atorvastatin (LIPITOR) 80 MG tablet TAKE ONE TABLET ONCE DAILY 12/13/22  Yes Rollene Rotunda, MD  carvedilol (COREG) 12.5 MG tablet TAKE ONE TABLET TWICE DAILY WITH MEAL(S) 03/29/23  Yes Rollene Rotunda, MD  cloNIDine (CATAPRES) 0.3 MG tablet TAKE (1) TABLET TWICE A DAY for blood pressure 05/03/23  Yes Gottschalk, Ashly M, DO  Continuous Blood Gluc Receiver (FREESTYLE LIBRE 2 READER) DEVI UAD to monitor sugars continuously. E11.22 09/17/22  Yes Gottschalk, Kathie Rhodes M, DO  Continuous Blood Gluc Sensor (FREESTYLE LIBRE 2 SENSOR) MISC Use as directed to monitor sugars continuously. E11.22 09/17/22  Yes Gottschalk, Kathie Rhodes M, DO  dapagliflozin propanediol (FARXIGA) 10 MG TABS tablet Take 1 tablet (10 mg total) by mouth daily before breakfast. 04/20/22  Yes Nadine Counts,  Ashly M, DO  furosemide (LASIX) 40 MG tablet Take 1 tablet (40 mg total) by mouth daily. 01/31/23  Yes Gottschalk, Kathie Rhodes M, DO  gabapentin (NEURONTIN) 100 MG capsule Take 1 capsule (100 mg total) by mouth 2 (two) times daily. 05/03/23  Yes Gottschalk, Ashly M, DO  insulin degludec (TRESIBA FLEXTOUCH) 100 UNIT/ML FlexTouch Pen INJECT 38 UP TO 50 UNITS DAILY AS DIRECTED. Patient taking differently: Inject 45 Units into  the skin in the morning. INJECT 38 UP TO 50 UNITS DAILY AS DIRECTED. 05/03/23  Yes Gottschalk, Ashly M, DO  Insulin Pen Needle (PEN NEEDLES) 31G X 5 MM MISC Use daily with insulin Dx E11.9 12/09/20  Yes Gottschalk, Ashly M, DO  isosorbide mononitrate (IMDUR) 60 MG 24 hr tablet TAKE 1 TABLET DAILY 10/11/22  Yes Rollene Rotunda, MD  losartan (COZAAR) 25 MG tablet Take 25 mg by mouth daily. 10/25/22  Yes [provider]  nitroGLYCERIN (NITROSTAT) 0.4 MG SL tablet Place 1 tablet (0.4 mg total) under the tongue every 5 (five) minutes as needed for chest pain. 06/12/21 05/11/23 Yes Kroeger, Ovidio Kin., PA-C  clopidogrel (PLAVIX) 75 MG tablet TAKE ONE TABLET ONCE DAILY 01/03/23   Rollene Rotunda, MD    Allergies as of 04/20/2023   (No Known Allergies)    Family History  Problem Relation Age of Onset   Heart attack Father 58       Died suddenly   Hypertension Father    Vision loss Sister        one eye   Cancer Brother        skin   Diabetes Brother     Social History   Socioeconomic History   Marital status: Married    Spouse name: Elease Hashimoto   Number of children: 3   Years of education: Not on file   Highest education level: Not on file  Occupational History   Occupation: retired    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 Has another son and one daughter    Raise cattle    Social Determinants of Health   Financial Resource Strain: Low Risk  (03/15/2023)   Overall Financial Resource Strain (CARDIA)    Difficulty of Paying Living Expenses: Not hard at all  Food  Insecurity: No Food Insecurity (03/15/2023)   Hunger Vital Sign    Worried About Running Out of Food in the Last Year: Never true    Ran Out of Food in the Last Year: Never true  Transportation Needs: No Transportation Needs (03/15/2023)   PRAPARE - Administrator, Civil Service (Medical): No    Lack of Transportation (Non-Medical): No  Physical Activity: Sufficiently Active (03/15/2023)   Exercise Vital Sign    Days of Exercise per Week: 5 days    Minutes of Exercise per Session: 60 min  Stress: No Stress Concern Present (03/15/2023)   [employer] of Occupational Health - Occupational Stress Questionnaire    Feeling  of Stress : Not at all  Social Connections: Moderately Isolated (03/15/2023)   Social Connection and Isolation Panel [NHANES]    Frequency of Communication with Friends and Family: More than three times a week    Frequency of Social Gatherings with Friends and Family: More than three times a week    Attends Religious Services: Never    Database administrator or Organizations: No    Attends Banker Meetings: Never    Marital Status: Married  Catering manager Violence: Not At Risk (03/15/2023)   Humiliation, Afraid, Rape, and Kick questionnaire    Fear of Current or Ex-Partner: No    Emotionally Abused: No    Physically Abused: No    Sexually Abused: No    Review of Systems: See HPI, otherwise negative ROS  Physical Exam: BP (!) 156/78   Pulse 62   Temp 98 F (36.7 C)   Resp 12   SpO2 98%  General:   Alert,  Well-developed, well-nourished, pleasant and cooperative in NAD Neck:  Supple; no masses or thyromegaly. Lungs:  Clear throughout to auscultation.   No wheezes, crackles, or rhonchi. No acute distress. Heart:  Regular rate and rhythm; no murmurs, clicks, rubs,  or gallops. Abdomen:  Soft, nontender and nondistended. No masses, hepatosplenomegaly or hernias noted. Normal bowel sounds, without guarding, and without rebound.    Impression/Plan: [name] is now here to undergo a screening colonoscopy.    Average risk screening examination  Risks, benefits, limitations, imponderables and alternatives regarding colonoscopy have been reviewed with the patient. Questions have been answered. All parties agreeable.     Notice:  This dictation was prepared with Dragon dictation along with smaller phrase technology. Any transcriptional errors that result from this process are unintentional and may not be corrected upon review.

## 2023-05-18 NOTE — Discharge Instructions (Signed)
   sigmoidoscopy Discharge Instructions  Read the instructions outlined below and refer to this sheet in the next few weeks. These discharge instructions provide you with general information on caring for yourself after you leave the hospital. Your doctor may also give you specific instructions. While your treatment has been planned according to the most current medical practices available, unavoidable complications occasionally occur. If you have any problems or questions after discharge, call Dr. Jena Gauss at [phone]. ACTIVITY You may resume your regular activity, but move at a slower pace for the next 24 hours.  Take frequent rest periods for the next 24 hours.  Walking will help get rid of the air and reduce the bloated feeling in your belly (abdomen).  No driving for 24 hours (because of the medicine (anesthesia) used during the test).   Do not sign any important legal documents or operate any machinery for 24 hours (because of the anesthesia used during the test).  NUTRITION Drink plenty of fluids.  You may resume your normal diet as instructed by your doctor.  Begin with a light meal and progress to your normal diet. Heavy or fried foods are harder to digest and may make you feel sick to your stomach (nauseated).  Avoid alcoholic beverages for 24 hours or as instructed.  MEDICATIONS You may resume your normal medications unless your doctor tells you otherwise.  WHAT YOU CAN EXPECT TODAY Some feelings of bloating in the abdomen.  Passage of more gas than usual.     your prep was not adequate.  Colonoscopy could not be performed today  Please schedule a follow-up appointment in our office.    At patient request, I called Montego Seedorf at [phone] contact. Spotting of blood in your stool or on the toilet paper.  IF YOU HAD POLYPS REMOVED DURING THE COLONOSCOPY: No aspirin products for 7 days or as instructed.  No alcohol for 7 days or as instructed.  Eat a soft diet for the  next 24 hours.  FINDING OUT THE RESULTS OF YOUR TEST Not all test results are available during your visit. If your test results are not back during the visit, make an appointment with your caregiver to find out the results. Do not assume everything is normal if you have not heard from your caregiver or the medical facility. It is important for you to follow up on all of your test results.  SEEK IMMEDIATE MEDICAL ATTENTION IF: You have more than a spotting of blood in your stool.  Your belly is swollen (abdominal distention).  You are nauseated or vomiting.  You have a temperature over 101.  You have abdominal pain or discomfort that is severe or gets worse throughout the day.

## 2023-05-20 NOTE — Anesthesia Postprocedure Evaluation (Signed)
Anesthesia Post Note  Patient: [name]  Procedure(s) Performed: FLEXIBLE SIGMOIDOSCOPY  Patient location during evaluation: Phase II Anesthesia Type: General Level of consciousness: awake Pain management: pain level controlled Vital Signs Assessment: post-procedure vital signs reviewed and stable Respiratory status: spontaneous breathing and respiratory function stable Cardiovascular status: blood pressure returned to baseline and stable Postop Assessment: no headache and no apparent nausea or vomiting Anesthetic complications: no Comments: Late entry   No notable events documented.   Last Vitals:  Vitals:   05/18/23 1345 05/18/23 1428  BP:  136/68  Pulse: 62 (!) 58  Resp: 12 10  Temp:  36.6 C  SpO2: 98% 99%    Last Pain:  Vitals:   05/20/23 1031  TempSrc:   PainSc: 0-No pain                 Windell Norfolk

## 2023-05-25 ENCOUNTER — Encounter (HOSPITAL_COMMUNITY): Payer: Self-pay | Admitting: Internal Medicine

## 2023-06-06 ENCOUNTER — Other Ambulatory Visit: Payer: Self-pay | Admitting: Cardiology

## 2023-06-14 NOTE — Progress Notes (Unsigned)
  Cardiology Office Note:   Date:  06/15/2023  ID:  [name], DOB 06-Jul-1956, MRN [id number] PCP: [name], DO  [facility] Providers Cardiologist:  Rollene Rotunda, MD {  History of Present Illness:   [name] is a 67 y.o. male who presents for follow up of CAD.  I saw him previously and he had chest pain but a negative perfusions study.  He presented a few weeks after this with chest pain.   He ruled in for NSTEMI and is status post PTCA.  Of note his EF was normal.  He was in the hospital in Sept 2022 with acute diastolic HF.  He had another cath and the anatomy was unchanged.  He has stented circumflex is widely patent.  Distal OM had 90% stenosis.  He had 90% stenosis in the first diagonal.  He had distal LAD 85% stenosis.  He had small PDA 90% stenosis off his right coronary.  More proximal large vessel disease was nonobstructive.  Since I last saw him he has continued to have some lower extremity swelling.  He denies any new shortness of breath, PND or orthopnea.  He has general fatigue but he says he sleeps pretty well.  He is not having any of the chest discomfort he had at the time of his MI.  He denies any palpitations, presyncope or syncope.  Has had no weight gain.  He does occasionally wear compression stockings and tries to limit salt.  ROS: As stated in the HPI and negative for all other systems.  Studies Reviewed:    EKG:   NA  Risk Assessment/Calculations:         Physical Exam:   VS:  BP (!) 141/68   Pulse (!) 59   Ht 6' (1.829 m)   Wt 219 lb 9.6 oz (99.6 kg)   SpO2 98%   BMI 29.78 kg/m    Wt Readings from Last 3 Encounters:  06/15/23 219 lb 9.6 oz (99.6 kg)  05/16/23 220 lb (99.8 kg)  05/03/23 220 lb (99.8 kg)     GEN: Well nourished, well developed in no acute distress NECK: No JVD; No carotid bruits CARDIAC: RRR, no murmurs, rubs, gallops RESPIRATORY:  Clear to auscultation without rales, wheezing or rhonchi   ABDOMEN: Soft, non-tender, non-distended EXTREMITIES: Mild leg edema; No deformity   ASSESSMENT AND PLAN:   CAD: He can stop his Plavix.  He is having no new symptoms.  No change in therapy.   HTN: BP is elevated mildly although he says at home it is in the 130s.  He is on maximum therapy.  No change in therapy.   HLD: LDL was 61 with an HDL of 33.  No change in therapy.  DM type 2: A1C was 6.1 which is down from previous.  No change in therapy.   CKD stage 3b: Cr was 2.18 which is relatively steady and is followed by nephrology.   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  However, he needs to continue this medication with his blood pressure issues.  No change in therapy.  FATIGUE:  I will check a TSH       Follow up with me in 12 months.   Signed, Rollene Rotunda, MD

## 2023-06-15 ENCOUNTER — Encounter: Payer: Self-pay | Admitting: Cardiology

## 2023-06-15 ENCOUNTER — Ambulatory Visit: Payer: Medicare HMO | Admitting: Cardiology

## 2023-06-15 ENCOUNTER — Other Ambulatory Visit: Payer: Self-pay | Admitting: *Deleted

## 2023-06-15 VITALS — BP 141/68 | HR 59 | Ht 72.0 in | Wt 219.6 lb

## 2023-06-15 DIAGNOSIS — I2510 Atherosclerotic heart disease of native coronary artery without angina pectoris: Secondary | ICD-10-CM | POA: Diagnosis not present

## 2023-06-15 DIAGNOSIS — E118 Type 2 diabetes mellitus with unspecified complications: Secondary | ICD-10-CM | POA: Diagnosis not present

## 2023-06-15 DIAGNOSIS — R5383 Other fatigue: Secondary | ICD-10-CM | POA: Diagnosis not present

## 2023-06-15 DIAGNOSIS — I10 Essential (primary) hypertension: Secondary | ICD-10-CM

## 2023-06-15 DIAGNOSIS — E785 Hyperlipidemia, unspecified: Secondary | ICD-10-CM | POA: Diagnosis not present

## 2023-06-15 DIAGNOSIS — N1832 Chronic kidney disease, stage 3b: Secondary | ICD-10-CM | POA: Diagnosis not present

## 2023-06-15 NOTE — Patient Instructions (Addendum)
Medication Instructions:  Please discontinue your Clopidogrel. Continue all other medications as listed.  *If you need a refill on your cardiac medications before your next appointment, please call your pharmacy*   Lab Work: Please have blood work at [facility]  [facility])  If you have labs (blood work) drawn today and your tests are completely normal, you will receive your results only by: MyChart Message (if you have MyChart) OR A paper copy in the mail If you have any lab test that is abnormal or we need to change your treatment, we will call you to review the results.   Follow-Up: At [facility], you and your health needs are our priority.  As part of our continuing mission to provide you with exceptional heart care, we have created designated Provider Care Teams.  These Care Teams include your primary Cardiologist (physician) and Advanced Practice Providers (APPs -  Physician Assistants and Nurse Practitioners) who all work together to provide you with the care you need, when you need it.  We recommend signing up for the patient portal called "MyChart".  Sign up information is provided on this After Visit Summary.  MyChart is used to connect with patients for Virtual Visits (Telemedicine).  Patients are able to view lab/test results, encounter notes, upcoming appointments, etc.  Non-urgent messages can be sent to your provider as well.   To learn more about what you can do with MyChart, go to [email or web address].    Your next appointment:   1 year(s)  Provider:   Rollene Rotunda, MD

## 2023-06-20 ENCOUNTER — Other Ambulatory Visit: Payer: Medicare HMO

## 2023-06-20 DIAGNOSIS — R5383 Other fatigue: Secondary | ICD-10-CM | POA: Diagnosis not present

## 2023-06-20 DIAGNOSIS — R809 Proteinuria, unspecified: Secondary | ICD-10-CM | POA: Diagnosis not present

## 2023-06-20 DIAGNOSIS — N189 Chronic kidney disease, unspecified: Secondary | ICD-10-CM | POA: Diagnosis not present

## 2023-06-20 DIAGNOSIS — D631 Anemia in chronic kidney disease: Secondary | ICD-10-CM | POA: Diagnosis not present

## 2023-06-24 ENCOUNTER — Encounter: Payer: Self-pay | Admitting: *Deleted

## 2023-06-24 DIAGNOSIS — E876 Hypokalemia: Secondary | ICD-10-CM | POA: Diagnosis not present

## 2023-06-24 DIAGNOSIS — N1832 Chronic kidney disease, stage 3b: Secondary | ICD-10-CM | POA: Diagnosis not present

## 2023-06-24 DIAGNOSIS — E1122 Type 2 diabetes mellitus with diabetic chronic kidney disease: Secondary | ICD-10-CM | POA: Diagnosis not present

## 2023-06-24 DIAGNOSIS — R808 Other proteinuria: Secondary | ICD-10-CM | POA: Diagnosis not present

## 2023-07-05 ENCOUNTER — Other Ambulatory Visit: Payer: Self-pay | Admitting: Cardiology

## 2023-07-26 ENCOUNTER — Telehealth: Payer: Self-pay | Admitting: Family Medicine

## 2023-08-08 NOTE — Patient Instructions (Signed)
Visit Information  Thank you for taking time to visit with me today. Please don't hesitate to contact me if I can be of assistance to you.   Following are the goals we discussed today:   Goals Addressed               This Visit's Progress     Patient Stated     COMPLETED: Develop plan of care for sleep apnea (THN) (pt-stated)        Arthritis vs RA      COMPLETED: Manage diabetes at home [facility]) (pt-stated)        Duplicate goal merged      Manage left side back pain, diabetes, sleep, CHF, chronic kidney disease(RN  CM) (pt-stated)        Care Coordination Interventions: Interventions Today    Flowsheet Row Most Recent Value  Chronic Disease   Chronic disease during today's visit Diabetes, Chronic Kidney Disease/End Stage Renal Disease (ESRD), Other  [Arthritis back pain]  General Interventions   General Interventions Discussed/Reviewed General Interventions Reviewed, Doctor Visits  Doctor Visits Discussed/Reviewed Doctor Visits Reviewed, PCP, Specialist  PCP/Specialist Visits Compliance with follow-up visit  Exercise Interventions   Exercise Discussed/Reviewed Exercise Reviewed, Physical Activity  Physical Activity Discussed/Reviewed Physical Activity Reviewed, Home Exercise Program (HEP)  Education Interventions   Education Provided Provided Education  Provided Verbal Education On Blood Sugar Monitoring, Mental Health/Coping with Illness, Exercise  Mental Health Interventions   Mental Health Discussed/Reviewed Mental Health Discussed, Coping Strategies  Nutrition Interventions   Nutrition Discussed/Reviewed Nutrition Discussed, Fluid intake  Pharmacy Interventions   Pharmacy Dicussed/Reviewed Pharmacy Topics Discussed, Medications and their functions, Affording Medications  Safety Interventions   Safety Discussed/Reviewed Safety Discussed, Safety Reviewed, Fall Risk           Other     COMPLETED: Manage CHF at home        Arthritis vs RA        Our next  appointment is by telephone on 08/17/23 at 3 pm  Please call the care guide team at [phone] if you need to cancel or reschedule your appointment.   If you are experiencing a Mental Health or Behavioral Health Crisis or need someone to talk to, please call the Suicide and Crisis Lifeline: 988 call the [place] National Suicide Prevention Lifeline: (279)717-0131 or TTY: (901)801-0072 TTY (661)476-8701) to talk to a trained counselor call 1-800-273-TALK (toll free, 24 hour hotline) call the [facility]: 514-781-4025 call 911   Patient verbalizes understanding of instructions and care plan provided today and agrees to view in MyChart. Active MyChart status and patient understanding of how to access instructions and care plan via MyChart confirmed with patient.     The patient has been provided with contact information for the care management team and has been advised to call with any health related questions or concerns.   Ulmer Degen L. Noelle Penner, RN, BSN, CCM, Care Management Coordinator [phone]

## 2023-08-17 ENCOUNTER — Ambulatory Visit: Payer: Self-pay | Admitting: *Deleted

## 2023-08-17 NOTE — Patient Outreach (Signed)
Care Coordination   08/17/2023 Name: [name] MRN: [id number] DOB: 06/27/1956   Care Coordination Outreach Attempts:  An unsuccessful telephone outreach was attempted for a scheduled appointment today.  Follow Up Plan:  Additional outreach attempts will be made to offer the patient care coordination information and services.   Encounter Outcome:  No Answer   Care Coordination Interventions:  No, not indicated    Steele Stracener L. Noelle Penner, RN, BSN, CCM, Care Management Coordinator [phone]

## 2023-08-25 ENCOUNTER — Other Ambulatory Visit: Payer: Medicare HMO

## 2023-08-25 DIAGNOSIS — D631 Anemia in chronic kidney disease: Secondary | ICD-10-CM | POA: Diagnosis not present

## 2023-08-25 DIAGNOSIS — R809 Proteinuria, unspecified: Secondary | ICD-10-CM | POA: Diagnosis not present

## 2023-08-25 DIAGNOSIS — E211 Secondary hyperparathyroidism, not elsewhere classified: Secondary | ICD-10-CM | POA: Diagnosis not present

## 2023-08-25 DIAGNOSIS — N189 Chronic kidney disease, unspecified: Secondary | ICD-10-CM | POA: Diagnosis not present

## 2023-08-26 ENCOUNTER — Telehealth: Payer: Self-pay | Admitting: *Deleted

## 2023-08-26 NOTE — Progress Notes (Signed)
Care Coordination Note  08/26/2023 Name: [name] MRN: [id number] DOB: 1956/02/18  [name] is a 67 y.o. year old male who is a primary care patient of [name], DO and is actively engaged with the care management team. I reached out to [name] by phone today to assist with re-scheduling a follow up visit with the RN Case Manager  Follow up plan: Unsuccessful telephone outreach attempt made. A HIPAA compliant phone message was left for the patient providing contact information and requesting a return call.  [facility]  Care Coordination Care Guide  Direct Dial: [phone]

## 2023-08-30 DIAGNOSIS — I129 Hypertensive chronic kidney disease with stage 1 through stage 4 chronic kidney disease, or unspecified chronic kidney disease: Secondary | ICD-10-CM | POA: Diagnosis not present

## 2023-08-30 DIAGNOSIS — E211 Secondary hyperparathyroidism, not elsewhere classified: Secondary | ICD-10-CM | POA: Diagnosis not present

## 2023-08-30 DIAGNOSIS — E1122 Type 2 diabetes mellitus with diabetic chronic kidney disease: Secondary | ICD-10-CM | POA: Diagnosis not present

## 2023-09-01 NOTE — Progress Notes (Signed)
Care Coordination Note  09/01/2023 Name: [name] MRN: [id number] DOB: 1956-01-08  [name] is a 67 y.o. year old male who is a primary care patient of [name], DO and is actively engaged with the care management team. I reached out to [name] by phone today to assist with re-scheduling a follow up visit with the RN Case Manager  Follow up plan: Telephone appointment with care management team member scheduled for:09/06/23  [facility] Coordination Care Guide  Direct Dial: [phone]

## 2023-09-05 ENCOUNTER — Ambulatory Visit (INDEPENDENT_AMBULATORY_CARE_PROVIDER_SITE_OTHER): Payer: Medicare HMO | Admitting: Family Medicine

## 2023-09-05 ENCOUNTER — Encounter: Payer: Self-pay | Admitting: Family Medicine

## 2023-09-05 VITALS — BP 140/65 | HR 60 | Temp 98.5°F | Ht 72.0 in | Wt 223.0 lb

## 2023-09-05 DIAGNOSIS — E785 Hyperlipidemia, unspecified: Secondary | ICD-10-CM | POA: Diagnosis not present

## 2023-09-05 DIAGNOSIS — N182 Chronic kidney disease, stage 2 (mild): Secondary | ICD-10-CM | POA: Diagnosis not present

## 2023-09-05 DIAGNOSIS — Z23 Encounter for immunization: Secondary | ICD-10-CM

## 2023-09-05 DIAGNOSIS — Z794 Long term (current) use of insulin: Secondary | ICD-10-CM

## 2023-09-05 DIAGNOSIS — E1169 Type 2 diabetes mellitus with other specified complication: Secondary | ICD-10-CM | POA: Diagnosis not present

## 2023-09-05 DIAGNOSIS — I152 Hypertension secondary to endocrine disorders: Secondary | ICD-10-CM | POA: Diagnosis not present

## 2023-09-05 DIAGNOSIS — E1122 Type 2 diabetes mellitus with diabetic chronic kidney disease: Secondary | ICD-10-CM | POA: Diagnosis not present

## 2023-09-05 DIAGNOSIS — E1159 Type 2 diabetes mellitus with other circulatory complications: Secondary | ICD-10-CM | POA: Diagnosis not present

## 2023-09-05 LAB — BAYER DCA HB A1C WAIVED: HB A1C (BAYER DCA - WAIVED): 6.3 % — ABNORMAL HIGH (ref 4.8–5.6)

## 2023-09-05 MED ORDER — NITROGLYCERIN 0.4 MG SL SUBL: 0.4000 mg | SUBLINGUAL_TABLET | SUBLINGUAL | 0 refills | Status: AC | PRN

## 2023-09-05 NOTE — Addendum Note (Signed)
Addended by: [name] on: 09/05/2023 04:50 PM   Modules accepted: Orders

## 2023-09-05 NOTE — Progress Notes (Signed)
Subjective: CC:DM PCP: [name], DO [id number] [name] is a 67 y.o. male presenting to clinic today for:  1. Type 2 Diabetes with hypertension, hyperlipidemia w/ CKD2:  He has freestyle [place] but has not been refilling due to cost.  Compliant with [place].  Injecting 45 units of Tresiba.  Compliant with blood pressure and cholesterol regimen.  Diabetes Health Maintenance Due  Topic Date Due   OPHTHALMOLOGY EXAM  09/05/2023 (Originally 10/29/2022)   HEMOGLOBIN A1C  11/02/2023   FOOT EXAM  01/31/2024    Last A1c:  Lab Results  Component Value Date   HGBA1C 6.1 (H) 05/03/2023    ROS: No blurred vision, shortness of breath, falls.  Had a very short live chest pain in the middle of his chest that resolved independently when he went to sleep.  He is under the care of Dr. Antoine Poche with recent OV that was normal.  He denies any current chest pain or shortness of breath.  Occasionally has right lower extremity edema and takes Lasix when this occurs.   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 For neuropathy, Disp: 90 capsule, Rfl: 3   amLODipine (NORVASC) 10 MG tablet, Take 1 tablet (10 mg total) by mouth daily., Disp: 90 tablet, Rfl: 1   aspirin 81 MG EC tablet, Take 1 tablet (81 mg total) by mouth daily. Swallow whole., Disp: 90 tablet, Rfl: 3   atorvastatin (LIPITOR) 80 MG tablet, TAKE ONE TABLET ONCE DAILY, Disp: 90 tablet,  Rfl: 3   carvedilol (COREG) 12.5 MG tablet, TAKE ONE TABLET TWICE DAILY WITH MEAL(S), Disp: 180 tablet, Rfl: 2   cloNIDine (CATAPRES) 0.3 MG tablet, TAKE (1) TABLET TWICE A DAY for blood pressure, Disp: 180 tablet, Rfl: 3   Continuous Blood Gluc Receiver (FREESTYLE LIBRE 2 READER) DEVI, UAD to monitor sugars continuously. E11.22, Disp: 1 each, Rfl: 0   Continuous Blood Gluc Sensor (FREESTYLE LIBRE 2 SENSOR) MISC, Use as directed to monitor sugars continuously. E11.22, Disp: 6 each, Rfl: 4   dapagliflozin propanediol (FARXIGA) 10 MG TABS tablet, Take 1 tablet (10 mg total) by mouth daily before breakfast., Disp: 90 tablet, Rfl: 5   furosemide (LASIX) 40 MG tablet, Take 1 tablet (40 mg total) by mouth daily., Disp: 90 tablet, Rfl: 3   gabapentin (NEURONTIN) 100 MG capsule, Take 1 capsule (100 mg total) by mouth 2 (two) times daily., Disp: 180 capsule, Rfl: 3   insulin degludec (TRESIBA FLEXTOUCH) 100 UNIT/ML FlexTouch Pen, INJECT 38 UP TO 50 UNITS DAILY AS DIRECTED. (Patient taking differently: Inject 45 Units into the skin in the morning. INJECT 38 UP TO 50 UNITS DAILY AS DIRECTED.), Disp: 45 mL, Rfl: 3   Insulin Pen Needle (PEN NEEDLES) 31G X 5 MM MISC, Use daily with insulin Dx E11.9, Disp: 100 each, Rfl: 3   isosorbide mononitrate (IMDUR) 60 MG 24 hr tablet, Take 1  tablet (60 mg total) by mouth daily., Disp: 90 tablet, Rfl: 3   losartan (COZAAR) 25 MG tablet, Take 25 mg by mouth daily., Disp: , Rfl:    nitroGLYCERIN (NITROSTAT) 0.4 MG SL tablet, Place 1 tablet (0.4 mg total) under the tongue every 5 (five) minutes as needed for chest pain., Disp: 25 tablet, Rfl: 3 Social History   Socioeconomic History   Marital status: Married    Spouse name: Elease Hashimoto   Number of children: 3   Years of education: Not on file   Highest education level: Not on file  Occupational History   Occupation: retired    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 Has another son and one daughter    Raise cattle    Social Determinants of Health   Financial Resource Strain: Low Risk  (03/15/2023)   Overall Financial Resource Strain (CARDIA)    Difficulty of Paying Living Expenses: Not hard at all  Food Insecurity: No Food Insecurity (03/15/2023)   Hunger Vital Sign    Worried About Running Out of Food in the Last Year: Never true    Ran Out of Food in the Last Year: Never true  Transportation Needs: No Transportation Needs (03/15/2023)   PRAPARE - Administrator, Civil Service (Medical): No    Lack of Transportation (Non-Medical): No  Physical Activity: Sufficiently Active (03/15/2023)   Exercise Vital Sign    Days of Exercise per Week: 5 days    Minutes of Exercise per Session: 60 min  Stress: No Stress Concern Present (03/15/2023)   [employer] of Occupational Health - Occupational Stress Questionnaire    Feeling of Stress : Not at all  Social Connections: Moderately Isolated (03/15/2023)   Social Connection and Isolation Panel [NHANES]    Frequency of Communication with Friends and Family: More than three times a week    Frequency of Social Gatherings with Friends and Family: More than three times a week    Attends Religious Services: Never    Database administrator or Organizations: No    Attends Banker Meetings: Never    Marital Status: Married  Catering manager Violence: Not At Risk (03/15/2023)   Humiliation, Afraid, Rape, and Kick questionnaire    Fear of Current or Ex-Partner: No    Emotionally Abused: No    Physically Abused: No    Sexually Abused: No    Family History  Problem Relation Age of Onset   Heart attack Father 53       Died suddenly   Hypertension Father    Vision loss Sister        one eye   Cancer Brother        skin   Diabetes Brother     Objective: Office vital signs reviewed. BP (!) 140/65   Pulse 60   Temp 98.5 F (36.9 C)   Ht 6' (1.829 m)   Wt 223 lb (101.2  kg)   SpO2 98%   BMI 30.24 kg/m   Physical Examination:  General: Awake, alert, well nourished, No acute distress HEENT:  Moist mucous membranes. Cardio: regular rate and rhythm, S1S2 heard, no murmurs appreciated Pulm: clear to auscultation bilaterally, no wheezes, rhonchi or rales; normal work of breathing on room air  Assessment/ Plan: 67 y.o. male   Type 2 diabetes mellitus with stage 2 chronic kidney disease, with long-term current use of insulin (HCC) - Plan: Microalbumin / creatinine urine ratio, Bayer DCA Hb A1c Waived, CANCELED: Hemoglobin A1c  Hyperlipidemia associated with type 2 diabetes mellitus (HCC)  Hypertension associated with diabetes (HCC)  Sugar under excellent control.  No changes needed.  Continue current regimen.  Encouraged him to monitor blood sugars closely given the use of insulin.  He voiced good understanding will go and get his freestyle libre renewed.  Check urine microalbumin.  Will get him set up for retinal exam here in office  Repeat blood pressure acceptable range.  Continue statin and current blood pressure regimen.  May follow-up in 4 months, sooner if concerns arise.  [name], DO [facility] [phone]

## 2023-09-06 ENCOUNTER — Ambulatory Visit: Payer: Self-pay | Admitting: *Deleted

## 2023-09-06 LAB — MICROALBUMIN / CREATININE URINE RATIO
Creatinine, Urine: 41.2 mg/dL
Microalb/Creat Ratio: 2960 mg/g{creat} — ABNORMAL HIGH (ref 0–29)
Microalbumin, Urine: 1219.5 ug/mL

## 2023-09-06 NOTE — Patient Instructions (Addendum)
Visit Information  Thank you for taking time to visit with me today. Please don't hesitate to contact me if I can be of assistance to you.   Following are the goals we discussed today:   Goals Addressed               This Visit's Progress     Patient Stated     Manage left side back pain, diabetes, sleep, CHF, chronic kidney disease(RN  CM) (pt-stated)   On track     Managing diabetes and chronic kidney well at home. Less left side back pain. Decrease symptoms with congestive heart failure & sleep Patient will outreach to RN CM as needed related to completing his Marcelline Deist medication assistance application    Care Coordination Interventions: Interventions Today    Flowsheet Row Most Recent Value  Chronic Disease   Chronic disease during today's visit Diabetes, Chronic Kidney Disease/End Stage Renal Disease (ESRD), Chronic Obstructive Pulmonary Disease (COPD), Other  [re enrollment in [place] with assistance of pcp pharmacy staff, Durward Mallard Everette]  General Interventions   General Interventions Discussed/Reviewed General Interventions Reviewed, Labs, Durable Medical Equipment (DME), Communication with, Sick Day Rules, Doctor Visits  Labs Hgb A1c every 6 months  Doctor Visits Discussed/Reviewed Doctor Visits Reviewed, PCP, Specialist  Durable Medical Equipment (DME) Glucomoter  PCP/Specialist Visits Compliance with follow-up visit  Communication with Pharmacists  [communicated with pcp pharmacy staff, camille while assessing patient. Patient made aware that he will receive a medication assistance application in the mail, needs to complete it & return it to pcp office & does not need to make a pharmacy appointment]  Education Interventions   Education Provided Provided Printed Education, Provided Education  [flu side effects, Farxiga assistance re enrollment, body mechanics for picking up items to prevent pain in back]  Provided Verbal Education On Labs, Blood Sugar Monitoring,  Medication, Sick Day Rules, [facility]  [discussed with trending HgA1c values below 6.5 he maybe able to have his medicines adjusted _ encouraged to monitor for hypoglycemia. Discussed use of Tylenol & nasal decongestant to decrease flu side effects]  Labs Reviewed Hgb A1c  Mental Health Interventions   Mental Health Discussed/Reviewed Mental Health Reviewed, Coping Strategies  Nutrition Interventions   Nutrition Discussed/Reviewed Nutrition Discussed, Fluid intake  Pharmacy Interventions   Pharmacy Dicussed/Reviewed Pharmacy Topics Reviewed, Medications and their functions, Affording Medications, Medication Adherence  Medication Adherence Unable to refill medication  [11/15/23 will be the last day to re enrolled in Farxiga medication assistance program to prevent a price increase for 2025. He is working with pcp phamacy staff to complete this]  Safety Interventions   Safety Discussed/Reviewed Safety Reviewed, Fall Risk             Our next appointment is by telephone on 10/07/2023 at 1100  Please call the care guide team at [phone] if you need to cancel or reschedule your appointment.   If you are experiencing a Mental Health or Behavioral Health Crisis or need someone to talk to, please call the Suicide and Crisis Lifeline: 988 call the [place] National Suicide Prevention Lifeline: (657) 690-7576 or TTY: 914-491-1064 TTY 872-096-0793) to talk to a trained counselor call 1-800-273-TALK (toll free, 24 hour hotline) call the [facility]: 801-595-5762 call 911   Patient verbalizes understanding of instructions and care plan provided today and agrees to view in MyChart. Active MyChart status and patient understanding of how to access instructions and care plan via MyChart confirmed with patient.  The patient has been provided with contact information for the care management team and has been advised to call with any health related questions or concerns.    Naiara Lombardozzi L. Noelle Penner, RN, BSN, [facility]  VBCI Care Management Coordinator  [phone]  Fax: [phone]

## 2023-09-06 NOTE — Patient Outreach (Signed)
Care Coordination   Follow Up Visit Note   09/06/2023 Name: [name] MRN: [id number] DOB: September 08, 1956  [name] is a 67 y.o. year old male who sees [name], DO for primary care. I spoke with  [name] by phone today.  What matters to the patients health and wellness today?  Sick after his flu shot on 08/2123  Diabetes - HgA1c 6.1 - farxiga medication assistance re enrollment needed for 2025  Back- stopped picking up heavy items and having less pain Will monitor it and learn how to use back body mechanics to prevent further back injury  Voiced appreciation for assistance with clarifying that he does not need to schedule an appointment to get assess   Goals Addressed               This Visit's Progress     Patient Stated     Manage left side back pain, diabetes, sleep, CHF, chronic kidney disease(RN  CM) (pt-stated)   On track     Managing diabetes and chronic kidney well at home. Less left side back pain. Decrease symptoms with congestive heart failure & sleep Patient will outreach to RN CM as needed related to completing his Marcelline Deist medication assistance application    Care Coordination Interventions: Interventions Today    Flowsheet Row Most Recent Value  Chronic Disease   Chronic disease during today's visit Diabetes, Chronic Kidney Disease/End Stage Renal Disease (ESRD), Chronic Obstructive Pulmonary Disease (COPD), Other  [re enrollment in [place] with assistance of pcp pharmacy staff, Durward Mallard Everette]  General Interventions   General Interventions Discussed/Reviewed General Interventions Reviewed, Labs, Durable Medical Equipment (DME), Communication with, Sick Day Rules, Doctor Visits  Labs Hgb A1c every 6 months  Doctor Visits Discussed/Reviewed Doctor Visits Reviewed, PCP, Specialist  Durable Medical Equipment (DME) Glucomoter  PCP/Specialist Visits Compliance with follow-up visit  Communication with Pharmacists   [communicated with pcp pharmacy staff, camille while assessing patient. Patient made aware that he will receive a medication assistance application in the mail, needs to complete it & return it to pcp office & does not need to make a pharmacy appointment]  Education Interventions   Education Provided Provided Printed Education, Provided Education  [flu side effects, Farxiga assistance re enrollment, body mechanics for picking up items to prevent pain in back]  Provided Verbal Education On Labs, Blood Sugar Monitoring, Medication, Sick Day Rules, [facility]  [discussed with trending HgA1c values below 6.5 he maybe able to have his medicines adjusted _ encouraged to monitor for hypoglycemia. Discussed use of Tylenol & nasal decongestant to decrease flu side effects]  Labs Reviewed Hgb A1c  Mental Health Interventions   Mental Health Discussed/Reviewed Mental Health Reviewed, Coping Strategies  Nutrition Interventions   Nutrition Discussed/Reviewed Nutrition Discussed, Fluid intake  Pharmacy Interventions   Pharmacy Dicussed/Reviewed Pharmacy Topics Reviewed, Medications and their functions, Affording Medications, Medication Adherence  Medication Adherence Unable to refill medication  [11/15/23 will be the last day to re enrolled in Farxiga medication assistance program to prevent a price increase for 2025. He is working with Dispensing optician to complete this]  Safety Interventions   Safety Discussed/Reviewed Safety Reviewed, Fall Risk             SDOH assessments and interventions completed:  No     Care Coordination Interventions:  Yes, provided   Follow up plan: Follow up call scheduled for 10/07/23    Encounter Outcome:  Patient Visit Completed   Cala Bradford  Anabel Bene, RN, BSN, [facility]  [facility] Care Management Coordinator  [phone]  Fax: [phone]

## 2023-09-23 ENCOUNTER — Telehealth: Payer: Self-pay

## 2023-09-23 NOTE — Progress Notes (Unsigned)
   Care Guide Note  09/23/2023 Name: [name] MRN: [id number] DOB: August 25, 1956  Referred by: [name], DO Reason for referral : Care Coordination (Outreach to schedule with pharm d )   [name] is a 67 y.o. year old male who is a primary care patient of [name], DO. [name] was referred to the pharmacist for assistance related to HTN, HLD, and DM.    An unsuccessful telephone outreach was attempted today to contact the patient who was referred to the pharmacy team for assistance with medication management. Additional attempts will be made to contact the patient.   [name], RMA Care Guide [facility]  [place], [place] [address] Direct Dial: [phone] [email or web address]

## 2023-09-27 NOTE — Progress Notes (Signed)
   Care Guide Note  09/27/2023 Name: Carlo Hopkins MRN: [id number] DOB: Jan 18, 1956  Referred by: [name], DO Reason for referral : Care Coordination (Outreach to schedule with pharm d )   Even Polio is a 67 y.o. year old male who is a primary care patient of [name], DO. Dezmon Toronto was referred to the pharmacist for assistance related to HTN, HLD, and DM.    A second unsuccessful telephone outreach was attempted today to contact the patient who was referred to the pharmacy team for assistance with medication management. Additional attempts will be made to contact the patient.  [name], RMA Care Guide [facility]  [place], [place] [address] Direct Dial: [phone] [email or web address]

## 2023-09-27 NOTE — Progress Notes (Signed)
   Care Guide Note  09/27/2023 Name: [name] MRN: [id number] DOB: 06/10/1956  Referred by: [name], DO Reason for referral : Care Coordination (Outreach to schedule with pharm d )   [name] is a 67 y.o. year old male who is a primary care patient of [name], DO. [name] was referred to the pharmacist for assistance related to HTN, HLD, and DM.    Successful contact was made with the patient to discuss pharmacy services including being ready for the pharmacist to call at least 5 minutes before the scheduled appointment time, to have medication bottles and any blood sugar or blood pressure readings ready for review. The patient agreed to meet with the pharmacist via with the pharmacist via telephone visit on (date/time).  10/28/2023  [name], RMA Care Guide [facility]  [place], [place] [address] Direct Dial: [phone] [email or web address]

## 2023-09-28 ENCOUNTER — Other Ambulatory Visit: Payer: Self-pay | Admitting: Family Medicine

## 2023-09-28 DIAGNOSIS — E1122 Type 2 diabetes mellitus with diabetic chronic kidney disease: Secondary | ICD-10-CM

## 2023-10-07 ENCOUNTER — Ambulatory Visit: Payer: Self-pay | Admitting: *Deleted

## 2023-10-07 NOTE — Patient Outreach (Addendum)
Care Coordination   Follow Up Visit Note   10/07/2023 Name: [name] MRN: [id number] DOB: [birth year]  [name] is a 67 y.o. year old male who sees Clifford Ip, DO for primary care. I spoke with  [name] by phone today.  What matters to the patients health and wellness today?  Medication assistance/Farxiga, SSI/taxes questions, Diabetes, activity, vaccines   A review of his Farxiga medication assistance pharmacy visit scheduled on 10/28/23  He denies being out of [place] & report his wife also takes it.  He asked questions about Social Security, taxes He voiced understanding that his supplemental security income (SSI) is not taxable after the age of 62. 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  He agrees to schedule a follow-up eye exam appointment  He denies any worsening symptoms for his diabetes, back pain, Chronic Kidney disease (CKD), COPD +   Goals Addressed               This Visit's Progress     Patient Stated     Receive Farxiga assistance/Manage diabetes, CHF/CKD, back pain at home (RN  CM) (pt-stated)   On track     Managing diabetes and chronic kidney well at home. Minimal eft side back pain.  Decrease symptoms with congestive heart failure & sleep- 10/07/23 doing better  Patient will outreach to RN CM as needed related to completing his Farxiga medication assistance application    Care Coordination Interventions: Interventions Today    Flowsheet Row Most Recent Value  Chronic Disease   Chronic disease during today's visit Diabetes, Other, Hypertension (HTN)  [Questions about SSI/taxes, any worsening symptoms, Pending Pharmacy assistance]  General Interventions   General Interventions Discussed/Reviewed General Interventions Reviewed, Labs, Annual Eye Exam, Vaccines, Doctor Visits, [employer] he  plans to get an eye exam, will consider getting covid & last shingle shot if needed, answered questions and sent a follow up letter about SSI/Taxes, reviewed pcp visit]  Labs Hgb A1c every 6 months  [Reviewed last HgA1c of 6.3, commended him on diabetes management]  Vaccines COVID-19, Shingles  Doctor Visits Discussed/Reviewed Doctor Visits Reviewed, PCP  PCP/Specialist Visits Compliance with follow-up visit  Exercise Interventions   Exercise Discussed/Reviewed Exercise Reviewed, Physical Activity  Physical Activity Discussed/Reviewed Physical Activity Reviewed, Types of exercise, Home Exercise Program (HEP)  [confirmed he remains active, 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]  Education Interventions   Education Provided Provided Education  Provided Verbal Education On Eye Care, Labs, Blood Sugar Monitoring, Mental Health/Coping with Illness, Applications, Exercise, Medication, [employer], Other  [annual preventive care, HgA1c, Diabetes, SSI, taxes]  Labs Reviewed Hgb A1c  Mental Health Interventions   Mental Health Discussed/Reviewed Mental Health Reviewed, Coping Strategies  Nutrition Interventions   Nutrition Discussed/Reviewed Nutrition Reviewed, Portion sizes, Decreasing sugar intake  Pharmacy Interventions   Pharmacy Dicussed/Reviewed Pharmacy Topics Reviewed, Medications and their functions, Affording Medications  [confirmed he plans to take the medication assistance forms he received to the pcp office pharmacy staff soon]  Safety Interventions   Safety Discussed/Reviewed Safety Reviewed, Home Safety  Home Safety Assistive Devices             SDOH assessments and interventions completed:  No     Care Coordination Interventions:  Yes, provided   Follow up plan: Follow up call scheduled  for 12/07/23    Encounter Outcome:  Patient Visit Completed   Clifford Bradford L. Noelle Penner, RN, BSN, [facility]  VBCI Care Management Coordinator  [phone]  Fax:  [phone]

## 2023-10-07 NOTE — Patient Instructions (Signed)
Visit Information  Thank you for taking time to visit with me today. Please don't hesitate to contact me if I can be of assistance to you.   Following are the goals we discussed today:   Goals Addressed               This Visit's Progress     Patient Stated     Receive Farxiga assistance/Manage diabetes, CHF/CKD, back pain at home (RN  CM) (pt-stated)   On track     Managing diabetes and chronic kidney well at home. Minimal eft side back pain.  Decrease symptoms with congestive heart failure & sleep- 10/07/23 doing better  Patient will outreach to RN CM as needed related to completing his Farxiga medication assistance application    Care Coordination Interventions: Interventions Today    Flowsheet Row Most Recent Value  Chronic Disease   Chronic disease during today's visit Diabetes, Other, Hypertension (HTN)  [Questions about SSI/taxes, any worsening symptoms, Pending Pharmacy assistance]  General Interventions   General Interventions Discussed/Reviewed General Interventions Reviewed, Labs, Annual Eye Exam, Vaccines, Doctor Visits, [employer] he plans to get an eye exam, will consider getting covid & last shingle shot if needed, answered questions and sent a follow up letter about SSI/Taxes, reviewed pcp visit]  Labs Hgb A1c every 6 months  [Reviewed last HgA1c of 6.3, commended him on diabetes management]  Vaccines COVID-19, Shingles  Doctor Visits Discussed/Reviewed Doctor Visits Reviewed, PCP  PCP/Specialist Visits Compliance with follow-up visit  Exercise Interventions   Exercise Discussed/Reviewed Exercise Reviewed, Physical Activity  Physical Activity Discussed/Reviewed Physical Activity Reviewed, Types of exercise, Home Exercise Program (HEP)  [confirmed he remains active, 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]  Education Interventions   Education Provided Provided Education  Provided Verbal Education On Eye Care, Labs,  Blood Sugar Monitoring, Mental Health/Coping with Illness, Applications, Exercise, Medication, [employer], Other  [annual preventive care, HgA1c, Diabetes, SSI, taxes]  Labs Reviewed Hgb A1c  Mental Health Interventions   Mental Health Discussed/Reviewed Mental Health Reviewed, Coping Strategies  Nutrition Interventions   Nutrition Discussed/Reviewed Nutrition Reviewed, Portion sizes, Decreasing sugar intake  Pharmacy Interventions   Pharmacy Dicussed/Reviewed Pharmacy Topics Reviewed, Medications and their functions, Affording Medications  [confirmed he plans to take the medication assistance forms he received to the pcp office pharmacy staff soon]  Safety Interventions   Safety Discussed/Reviewed Safety Reviewed, Home Safety  Home Safety Assistive Devices             Our next appointment is by telephone on 12/07/23 at 1100  Please call the care guide team at [phone] if you need to cancel or reschedule your appointment.   If you are experiencing a Mental Health or Behavioral Health Crisis or need someone to talk to, please call the Suicide and Crisis Lifeline: 988 call the [place] National Suicide Prevention Lifeline: 518-085-1657 or TTY: 262-410-4521 TTY 5635583585) to talk to a trained counselor call 1-800-273-TALK (toll free, 24 hour hotline) call the [facility]: 3077369826 call 911   Patient does not access my chart, not request for AVS  The patient has been provided with contact information for the care management team and has been advised to call with any health related questions or concerns.    Asees Manfredi L. Noelle Penner, RN, BSN, [facility]  VBCI Care Management Coordinator  [phone]  Fax: [phone]

## 2023-10-24 ENCOUNTER — Telehealth: Payer: Self-pay

## 2023-10-26 NOTE — Progress Notes (Unsigned)
Pharmacy Medication Assistance Program Note    10/26/2023  Patient ID: [name], male   DOB: [birth year], 67 y.o.   MRN: [id number]     10/24/2023  Outreach [employer]ication One  Manufacturer Medication One Nurse, adult Drugs Farxiga  Dose of Farxiga 10MG   Type of Radiographer, therapeutic Assistance  Date Application Sent to Patient 10/26/2023  Application Items Requested Application

## 2023-10-28 ENCOUNTER — Other Ambulatory Visit: Payer: Medicare HMO | Admitting: Pharmacist

## 2023-10-28 ENCOUNTER — Telehealth: Payer: Self-pay | Admitting: Pharmacist

## 2023-10-28 DIAGNOSIS — E1151 Type 2 diabetes mellitus with diabetic peripheral angiopathy without gangrene: Secondary | ICD-10-CM

## 2023-10-28 MED ORDER — DAPAGLIFLOZIN PROPANEDIOL 10 MG PO TABS: 10.0000 mg | ORAL_TABLET | Freq: Every day | ORAL | 5 refills | Status: DC

## 2023-10-28 NOTE — Telephone Encounter (Signed)
   Patient [place] the [place] for [place].  Updated RX escribed to [facility] mail order (pharmacy for [place] patient assistance).  Patient is stable on current regimen.  He will need to re-enroll for 2025.  Please send me entire PAP if able.    Kieth Brightly, PharmD, BCACP, CPP Clinical Pharmacist, [facility]

## 2023-11-11 ENCOUNTER — Ambulatory Visit: Payer: Medicare HMO

## 2023-11-11 ENCOUNTER — Telehealth: Payer: Self-pay

## 2023-11-11 NOTE — Telephone Encounter (Signed)
Copied from CRM [id number]. Topic: General - Other >> Nov 11, 2023 12:23 PM Antony Haste wrote: Reason for CRM: PT wants to schedule a lab appointment for bloodwork, PT's wife Elease Hashimoto wanted to determine if the lab orders came in, she states his new prescription can't be refilled yet and would like him to be seen if possible.  Callback #: [phone]

## 2023-11-14 ENCOUNTER — Other Ambulatory Visit: Payer: Medicare HMO

## 2023-11-14 NOTE — Telephone Encounter (Signed)
Emailed patient page to you :)

## 2023-11-14 NOTE — Telephone Encounter (Signed)
Attempted to contact - NA 

## 2023-11-18 ENCOUNTER — Encounter: Payer: Self-pay | Admitting: Internal Medicine

## 2023-11-24 DIAGNOSIS — I129 Hypertensive chronic kidney disease with stage 1 through stage 4 chronic kidney disease, or unspecified chronic kidney disease: Secondary | ICD-10-CM | POA: Diagnosis not present

## 2023-11-24 DIAGNOSIS — E1122 Type 2 diabetes mellitus with diabetic chronic kidney disease: Secondary | ICD-10-CM | POA: Diagnosis not present

## 2023-11-24 DIAGNOSIS — D638 Anemia in other chronic diseases classified elsewhere: Secondary | ICD-10-CM | POA: Diagnosis not present

## 2023-11-24 DIAGNOSIS — E211 Secondary hyperparathyroidism, not elsewhere classified: Secondary | ICD-10-CM | POA: Diagnosis not present

## 2023-11-30 NOTE — Progress Notes (Signed)
Pharmacy Medication Assistance Program Note    12/15/2023  Patient ID: [name], male   DOB: 08-Jan-1956, 68 y.o.   MRN: [id number]     10/24/2023 11/30/2023  Outreach [employer]ication One  Manufacturer Medication One [employer]   [employer] [facility] Farxiga   Dose of Farxiga 10MG    Type of Assistance Manufacturer Assistance   Date Application Sent to Patient 10/26/2023   Application Items Requested Application   Date Application Received From Patient 11/25/2023   Date Application Received From Provider  11/25/2023  Date Application Submitted to Manufacturer 11/30/2023 11/30/2023  Method Application Sent to Manufacturer  Fax  Patient Assistance Determination Approved   Approval Start Date 12/01/2023   Approval End Date 11/14/2024     Approved and shipment processing as of 12/15/23

## 2023-12-07 ENCOUNTER — Ambulatory Visit: Payer: Self-pay | Admitting: *Deleted

## 2023-12-07 NOTE — Patient Outreach (Signed)
  Care Coordination   Follow Up Visit Note   12/07/2023 Name: [name] MRN: [id number] DOB: [birth year]  [name] is a 68 y.o. year old male who sees [name], DO for primary care. I spoke with  [name] by phone today.  What matters to the patients health and wellness today?  About 3 am he gets heartburn/GERD (gastroesophageal reflux disease) worsening symptoms, farxiga medication assistance pending?   Farxiga/Diabetes- Reports good cbg values, eating better related to Diabetes (DM) type 2   Chronic Kidney disease (CKD) labs, microalbumin/Creatinine ratio elevated. Was 1822 now last one in 08/2023 was 2960 - also elevated   Voiced understanding of CKD Education Will increase fluids, try drinking a glass or more with meals and pills    GERD (gastroesophageal reflux disease) Eats about 2130 goes to bed about 09-1129  Has eating pizza with pepper flakes recently No listed GERD med Discussed milk, coating stomach prior to eating, over the counter (OTC) GERD medicines. To send GERD educ my chart ? Mail   tr   Goals Addressed               This Visit's Progress     Patient Stated     Receive Farxiga assistance/Manage diabetes, CHF/CKD, back pain at home (RN  CM) (pt-stated)   On track     Managing diabetes well at home. Minimal left side back pain.  Decrease symptoms with congestive heart failure - 12/07/23 doing better - no swelling noted  Patient will outreach to RN CM as needed related to completing his Farxiga medication assistance application 12/07/23 patient will try recommendations to decrease early morning GERD symptoms & updated RN CM   Interventions Today    Flowsheet Row Most Recent Value  Chronic Disease   Chronic disease during today's visit Diabetes, Congestive Heart Failure (CHF), Chronic Kidney Disease/End Stage Renal Disease (ESRD), Other  [last Microalb/Creat Ratio, farxiga, potassiumleg cramps, GERD/prevention]   General Interventions   General Interventions Discussed/Reviewed General Interventions Reviewed, Labs, Doctor Visits  Labs Kidney Function  Doctor Visits Discussed/Reviewed Doctor Visits Reviewed, PCP  PCP/Specialist Visits Compliance with follow-up visit  Education Interventions   Education Provided Provided Web-based Education, Provided Education, Provided Printed Education  [microalbumin/creatinine ratio, GERD, farxiga/medication assistance, insurance deductibles,]  Provided Verbal Education On Nutrition, Labs, Blood Sugar Monitoring, Medication, Development worker, community, Other  Labs Reviewed Kidney Function  Mental Health Interventions   Mental Health Discussed/Reviewed Mental Health Reviewed, Coping Strategies  Nutrition Interventions   Nutrition Discussed/Reviewed Nutrition Reviewed, Fluid intake  [potassium foods]  Pharmacy Interventions   Pharmacy Dicussed/Reviewed Pharmacy Topics Reviewed, Medications and their functions, Affording Medications, Medication Adherence  Medication Adherence Unable to refill medication  [pending a letter and or medicine assist for Farxiga]              SDOH assessments and interventions completed:  No     Care Coordination Interventions:  Yes, provided   Follow up plan: Follow up call scheduled for 01/09/24 1100    Encounter Outcome:  Patient Visit Completed   [name] L. Noelle Penner, RN, BSN, CCM [place]  [facility], [facility] RN Care Manager Direct Dial: [phone]  Fax: [phone] Mailing Address: [address]  [place] [address] Website: [email or web address]

## 2023-12-07 NOTE — Patient Instructions (Addendum)
Visit Information  Thank you for taking time to visit with me today. Please don't hesitate to contact me if I can be of assistance to you.   Following are the goals we discussed today:   Goals Addressed               This Visit's Progress     Patient Stated     Receive Farxiga assistance/Manage diabetes, CHF/CKD, back pain at home (RN  CM) (pt-stated)   On track     Managing diabetes well at home. Minimal left side back pain.  Decrease symptoms with congestive heart failure - 12/07/23 doing better - no swelling noted  Patient will outreach to RN CM as needed related to completing his Farxiga medication assistance application 12/07/23 patient will try recommendations to decrease early morning GERD symptoms & updated RN CM   Interventions Today    Flowsheet Row Most Recent Value  Chronic Disease   Chronic disease during today's visit Diabetes, Congestive Heart Failure (CHF), Chronic Kidney Disease/End Stage Renal Disease (ESRD), Other  [last Microalb/Creat Ratio, farxiga, potassiumleg cramps, GERD/prevention]  General Interventions   General Interventions Discussed/Reviewed General Interventions Reviewed, Labs, Doctor Visits  Labs Kidney Function  Doctor Visits Discussed/Reviewed Doctor Visits Reviewed, PCP  PCP/Specialist Visits Compliance with follow-up visit  Education Interventions   Education Provided Provided Web-based Education, Provided Education, Provided Printed Education  [microalbumin/creatinine ratio, GERD, farxiga/medication assistance, insurance deductibles,]  Provided Verbal Education On Nutrition, Labs, Blood Sugar Monitoring, Medication, Development worker, community, Other  Labs Reviewed Kidney Function  Mental Health Interventions   Mental Health Discussed/Reviewed Mental Health Reviewed, Coping Strategies  Nutrition Interventions   Nutrition Discussed/Reviewed Nutrition Reviewed, Fluid intake  [potassium foods]  Pharmacy Interventions   Pharmacy Dicussed/Reviewed Pharmacy  Topics Reviewed, Medications and their functions, Affording Medications, Medication Adherence  Medication Adherence Unable to refill medication  [pending a letter and or medicine assist for Farxiga]              Our next appointment is by telephone on 01/09/24 at 1100  Please call the care guide team at [phone] if you need to cancel or reschedule your appointment.   If you are experiencing a Mental Health or Behavioral Health Crisis or need someone to talk to, please call the Suicide and Crisis Lifeline: 988 call the [place] National Suicide Prevention Lifeline: 450-039-4192 or TTY: 3473536346 TTY (717)001-7169) to talk to a trained counselor call 1-800-273-TALK (toll free, 24 hour hotline) call the [facility]: 256-278-1304 call 911   Patient verbalizes understanding of instructions and care plan provided today and agrees to view in MyChart. Active MyChart status and patient understanding of how to access instructions and care plan via MyChart confirmed with patient.     The patient has been provided with contact information for the care management team and has been advised to call with any health related questions or concerns.   Piedad Standiford L. Noelle Penner, RN, BSN, CCM [place]  [facility], [facility] RN Care Manager Direct Dial: [phone]  Fax: [phone] Mailing Address: [address]  [place] [address] Website: [email or web address]

## 2023-12-09 ENCOUNTER — Encounter: Payer: Self-pay | Admitting: Family Medicine

## 2024-01-03 ENCOUNTER — Other Ambulatory Visit: Payer: Self-pay | Admitting: Cardiology

## 2024-01-06 ENCOUNTER — Ambulatory Visit (INDEPENDENT_AMBULATORY_CARE_PROVIDER_SITE_OTHER): Payer: Medicare HMO | Admitting: Family Medicine

## 2024-01-06 ENCOUNTER — Encounter: Payer: Self-pay | Admitting: Family Medicine

## 2024-01-06 VITALS — BP 140/55 | HR 60 | Temp 98.7°F | Ht 72.0 in | Wt 221.0 lb

## 2024-01-06 DIAGNOSIS — N182 Chronic kidney disease, stage 2 (mild): Secondary | ICD-10-CM | POA: Diagnosis not present

## 2024-01-06 DIAGNOSIS — Z794 Long term (current) use of insulin: Secondary | ICD-10-CM

## 2024-01-06 DIAGNOSIS — E785 Hyperlipidemia, unspecified: Secondary | ICD-10-CM | POA: Diagnosis not present

## 2024-01-06 DIAGNOSIS — R052 Subacute cough: Secondary | ICD-10-CM

## 2024-01-06 DIAGNOSIS — E1140 Type 2 diabetes mellitus with diabetic neuropathy, unspecified: Secondary | ICD-10-CM | POA: Diagnosis not present

## 2024-01-06 DIAGNOSIS — I152 Hypertension secondary to endocrine disorders: Secondary | ICD-10-CM

## 2024-01-06 DIAGNOSIS — E1122 Type 2 diabetes mellitus with diabetic chronic kidney disease: Secondary | ICD-10-CM

## 2024-01-06 DIAGNOSIS — K2100 Gastro-esophageal reflux disease with esophagitis, without bleeding: Secondary | ICD-10-CM | POA: Diagnosis not present

## 2024-01-06 DIAGNOSIS — E1169 Type 2 diabetes mellitus with other specified complication: Secondary | ICD-10-CM | POA: Diagnosis not present

## 2024-01-06 DIAGNOSIS — N184 Chronic kidney disease, stage 4 (severe): Secondary | ICD-10-CM

## 2024-01-06 DIAGNOSIS — E1159 Type 2 diabetes mellitus with other circulatory complications: Secondary | ICD-10-CM

## 2024-01-06 LAB — BAYER DCA HB A1C WAIVED: HB A1C (BAYER DCA - WAIVED): 6.1 % — ABNORMAL HIGH (ref 4.8–5.6)

## 2024-01-06 MED ORDER — FREESTYLE LIBRE 3 READER DEVI: 0 refills | Status: DC

## 2024-01-06 MED ORDER — TRESIBA FLEXTOUCH 100 UNIT/ML ~~LOC~~ SOPN
PEN_INJECTOR | SUBCUTANEOUS | 3 refills | Status: AC
Start: 2024-01-06 — End: ?

## 2024-01-06 MED ORDER — ATORVASTATIN CALCIUM 80 MG PO TABS: 80.0000 mg | ORAL_TABLET | Freq: Every day | ORAL | 3 refills | Status: AC

## 2024-01-06 MED ORDER — FUROSEMIDE 40 MG PO TABS: 40.0000 mg | ORAL_TABLET | Freq: Every day | ORAL | 3 refills | Status: AC

## 2024-01-06 MED ORDER — AMLODIPINE BESYLATE 10 MG PO TABS: 10.0000 mg | ORAL_TABLET | Freq: Every day | ORAL | Status: AC

## 2024-01-06 MED ORDER — FREESTYLE LIBRE 3 PLUS SENSOR MISC: 3 refills | Status: DC

## 2024-01-06 MED ORDER — PANTOPRAZOLE SODIUM 40 MG PO TBEC: 40.0000 mg | DELAYED_RELEASE_TABLET | Freq: Every day | ORAL | 0 refills | Status: DC

## 2024-01-06 MED ORDER — GABAPENTIN 100 MG PO CAPS: 100.0000 mg | ORAL_CAPSULE | Freq: Two times a day (BID) | ORAL | 3 refills | Status: DC

## 2024-01-06 MED ORDER — CLONIDINE HCL 0.3 MG PO TABS
ORAL_TABLET | ORAL | 3 refills | Status: DC
Start: 2024-01-06 — End: 2024-06-20

## 2024-01-06 NOTE — Progress Notes (Addendum)
 Subjective: CC:DM PCP: [name], DO FAO:[id number] [name] is a 68 y.o. male presenting to clinic today for:  1. Type 2 Diabetes with hypertension, CKD4, hyperlipidemia w/ neuropathy:  Reports compliance with medications.  He has had no hypoglycemic episodes.  Needs refills today.  Denies any dysuria, hematuria, penile discharge or discoloration.  Diabetes Health Maintenance Due  Topic Date Due   OPHTHALMOLOGY EXAM  01/06/2024 (Originally 10/29/2022)   FOOT EXAM  01/31/2024   HEMOGLOBIN A1C  03/05/2024    Last A1c:  Lab Results  Component Value Date   HGBA1C 6.3 (H) 09/05/2023    ROS: Has neuropathy and sometimes his legs feel little weak.  He started exercising again and he did find this to be helpful.  Denies any gross swelling.  Takes gabapentin twice daily for neuropathy  2.  Cough He reports that he has had an intermittent productive cough.  This is most prominent in the morning where he feels like he has a lot of mucus buildup in his chest and he has to cough it out.  Denies any hemoptysis or brown sputum.  No fevers.  No shortness of breath or change in exercise tolerance from a pulmonary standpoint.  He does admit to some nighttime GERD that does wake him from sleep.  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 For neuropathy, Disp: 90 capsule, Rfl: 3   amLODipine  (NORVASC) 10 MG tablet, Take 1 tablet (10 mg total) by mouth daily., Disp: 90 tablet, Rfl: 1   aspirin 81 MG EC tablet, Take 1 tablet (81 mg total) by mouth daily. Swallow whole., Disp: 90 tablet, Rfl: 3   atorvastatin (LIPITOR) 80 MG tablet, TAKE ONE TABLET ONCE DAILY, Disp: 90 tablet, Rfl: 3   carvedilol (COREG) 12.5 MG tablet, TAKE ONE TABLET TWICE DAILY WITH MEAL(S), Disp: 180 tablet, Rfl: 1   cloNIDine (CATAPRES) 0.3 MG tablet, TAKE (1) TABLET TWICE A DAY for blood pressure, Disp: 180 tablet, Rfl: 3   Continuous Blood Gluc Receiver (FREESTYLE LIBRE 2 READER) DEVI, UAD to monitor sugars continuously. E11.22, Disp: 1 each, Rfl: 0   Continuous Glucose Sensor (FREESTYLE LIBRE 2 SENSOR) MISC, USE AS DIRECTED TO CONTINUOUSLY MONITOR SUGARS, Disp: 6 each, Rfl: 3   dapagliflozin propanediol (FARXIGA) 10 MG TABS tablet, Take 1 tablet (10 mg total) by mouth daily before breakfast., Disp: 90 tablet, Rfl: 5   furosemide (LASIX) 40 MG tablet, Take 1 tablet (40 mg total) by mouth daily., Disp: 90 tablet, Rfl: 3   gabapentin (NEURONTIN) 100 MG capsule, Take 1 capsule (100 mg total) by mouth 2 (two) times daily., Disp: 180 capsule, Rfl: 3   insulin degludec (TRESIBA FLEXTOUCH) 100 UNIT/ML FlexTouch Pen, INJECT 38 UP TO 50 UNITS DAILY AS DIRECTED. (Patient taking differently: Inject 45 Units into the skin in the morning. INJECT 38  UP TO 50 UNITS DAILY AS DIRECTED.), Disp: 45 mL, Rfl: 3   Insulin Pen Needle (PEN NEEDLES) 31G X 5 MM MISC, Use daily with insulin Dx E11.9, Disp: 100 each, Rfl: 3   isosorbide mononitrate (IMDUR) 60 MG 24 hr tablet, Take 1 tablet (60 mg total) by mouth daily., Disp: 90 tablet, Rfl: 3   losartan (COZAAR) 25 MG tablet, Take 25 mg by mouth daily., Disp: , Rfl:    nitroGLYCERIN (NITROSTAT) 0.4 MG SL tablet, Place 1 tablet (0.4 mg total) under the tongue every 5 (five) minutes as needed for chest pain., Disp: 25 tablet, Rfl: 0   polyethylene glycol-electrolytes (NULYTELY) 420 g solution,  MIX AND DRINK AS DIRECTED, Disp: , Rfl:  Social History   Socioeconomic History   Marital status: Married    Spouse name: Elease Hashimoto   Number of children: 3   Years of education: Not on file   Highest education level: Not on file  Occupational History   Occupation: 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 Has another son and one daughter    Raise cattle    Social Drivers of Health   Financial Resource Strain: Low Risk  (03/15/2023)   Overall Financial Resource Strain (CARDIA)    Difficulty of Paying Living Expenses: Not hard at all  Food Insecurity: No Food Insecurity (03/15/2023)   Hunger Vital Sign    Worried About Running Out of Food in the Last Year: Never true    Ran Out of Food in the Last Year: Never true  Transportation Needs: No Transportation Needs (03/15/2023)   PRAPARE - Administrator, Civil Service (Medical): No    Lack of Transportation (Non-Medical): No  Physical Activity: Sufficiently Active (03/15/2023)   Exercise Vital Sign    Days of Exercise per Week: 5 days    Minutes of Exercise per Session: 60 min  Stress: No Stress Concern Present (03/15/2023)   [employer] of Occupational Health - Occupational Stress Questionnaire    Feeling of Stress : Not at all  Social Connections: Moderately Isolated (03/15/2023)   Social Connection and Isolation Panel [NHANES]    Frequency of Communication with Friends and Family: More than three times a week    Frequency of Social Gatherings with Friends and Family: More than three times a week     Attends Religious Services: Never    Database administrator or Organizations: No    Attends Banker Meetings: Never    Marital Status: Married  Catering manager Violence: Not At Risk (03/15/2023)   Humiliation, Afraid, Rape, and Kick questionnaire    Fear of Current or Ex-Partner: No    Emotionally Abused: No    Physically Abused: No    Sexually Abused: No   Family History  Problem Relation Age of Onset   Heart attack Father 23       Died suddenly   Hypertension Father  Vision loss Sister        one eye   Cancer Brother        skin   Diabetes Brother     Objective: Office vital signs reviewed. BP (!) 140/55   Pulse 60   Temp 98.7 F (37.1 C)   Ht 6' (1.829 m)   Wt 221 lb (100.2 kg)   SpO2 98%   BMI 29.97 kg/m   Physical Examination:  General: Awake, alert, well nourished, No acute distress HEENT: sclera white, MMM Cardio: regular rate and rhythm, S1S2 heard, no murmurs appreciated Pulm: clear to auscultation bilaterally, no wheezes, rhonchi or rales; normal work of breathing on room air  Assessment/ Plan: 68 y.o. male   Type 2 diabetes mellitus with stage 4 chronic kidney disease, with long-term current use of insulin (HCC) - Plan: CMP14+EGFR, Bayer DCA Hb A1c Waived, Continuous Glucose Sensor (FREESTYLE LIBRE 3 PLUS SENSOR) MISC, Continuous Glucose Receiver (FREESTYLE LIBRE 3 READER) DEVI, insulin degludec (TRESIBA FLEXTOUCH) 100 UNIT/ML FlexTouch Pen  Hyperlipidemia associated with type 2 diabetes mellitus (HCC) - Plan: CMP14+EGFR, Lipid Panel  Hypertension associated with diabetes (HCC) - Plan: CMP14+EGFR, cloNIDine (CATAPRES) 0.3 MG tablet, amLODipine (NORVASC) 10 MG tablet  Neuropathy due to type 2 diabetes mellitus (HCC) - Plan: CMP14+EGFR, gabapentin (NEURONTIN) 100 MG capsule  Subacute cough - Plan: pantoprazole (PROTONIX) 40 MG tablet  Gastroesophageal reflux disease with esophagitis without hemorrhage - Plan: pantoprazole (PROTONIX) 40 MG  tablet  Medications have been renewed.  I have switched him to the freestyle libre 3+ because the [place] 2's are being phased out.  I gave him a couple of samples today as well.    Lipid panel, liver function test collected today.  Did not need refill on statin  Blood pressure controlled.  No changes needed.  Looks like his cardiologist already sent over the carvedilol this morning  I have renewed his gabapentin for neuropathy   I am going to trial him on PPI since his pulmonary symptoms are otherwise unremarkable.  Will reevaluate him again in 1 month, sooner if concerns arise  [name], DO [facility] [phone]

## 2024-01-07 LAB — CMP14+EGFR
ALT: 19 [IU]/L (ref 0–44)
AST: 18 [IU]/L (ref 0–40)
Albumin: 4.2 g/dL (ref 3.9–4.9)
Alkaline Phosphatase: 85 [IU]/L (ref 44–121)
BUN/Creatinine Ratio: 14 (ref 10–24)
BUN: 42 mg/dL — ABNORMAL HIGH (ref 8–27)
Bilirubin Total: 0.7 mg/dL (ref 0.0–1.2)
CO2: 21 mmol/L (ref 20–29)
Calcium: 8.9 mg/dL (ref 8.6–10.2)
Chloride: 100 mmol/L (ref 96–106)
Creatinine, Ser: 2.92 mg/dL — ABNORMAL HIGH (ref 0.76–1.27)
Globulin, Total: 2.4 g/dL (ref 1.5–4.5)
Glucose: 112 mg/dL — ABNORMAL HIGH (ref 70–99)
Potassium: 4.7 mmol/L (ref 3.5–5.2)
Sodium: 137 mmol/L (ref 134–144)
Total Protein: 6.6 g/dL (ref 6.0–8.5)
eGFR: 23 mL/min/{1.73_m2} — ABNORMAL LOW (ref >59)

## 2024-01-07 LAB — LIPID PANEL
Chol/HDL Ratio: 2.9 {ratio} (ref 0.0–5.0)
Cholesterol, Total: 102 mg/dL (ref 100–199)
HDL: 35 mg/dL — ABNORMAL LOW (ref >39)
LDL Chol Calc (NIH): 51 mg/dL (ref 0–99)
Triglycerides: 81 mg/dL (ref 0–149)
VLDL Cholesterol Cal: 16 mg/dL (ref 5–40)

## 2024-01-09 ENCOUNTER — Encounter: Payer: Self-pay | Admitting: Family Medicine

## 2024-01-09 ENCOUNTER — Ambulatory Visit: Payer: Self-pay | Admitting: *Deleted

## 2024-01-09 NOTE — Patient Outreach (Incomplete)
 Care Coordination   Follow Up Visit Note   01/10/2024 Name: [name] MRN: [id number] DOB: 1956-10-07  [name] is a 68 y.o. year old male who sees [name], DO for primary care. I spoke with  [name] by phone today.  What matters to the patients health and wellness today?  The patient experienced a restless night with coughing and sputum issues. He consulted pcp who discussed that GERD (gastroesophageal reflux disease) may be the cause. He was offered a PPI Protonix but is cautious to use related to side effects that may injure his kidney (Chronic Kidney Disease=CKD)  Key Points:  Coughing up thick, white sputum at 2 AM 01/09/24  No recent sick contacts; persistent symptoms noted.  Experiences runny nose, watery eyes, sneezing, and congestion.  Denies GERD as a waking issue; but feels he wakes up regarding congestion and coughing episodes    Has not used CPAP for over a year; He confirms he sleeps with his mouth open which leads to a dry mouth & the symptoms already discussed.  Dry cough occurs intermittently; encouraged to manage congestion.  He confirms he slept better in a recliner at a 45-degree angle.  Discuss attempting to possible get office pharmacy staff to review possible side effects of his medications (Protonix/CKD)  Commended for advocating for self-care.  Currently he & his wife are staying with son to assist in his care; a humidifier is available but unused.   He informs RN CM he will take her suggestion and start using his humidifier today to keep moisture in the air which can help allergies and respiratory problems He voiced understanding how not using his CPAP, humidifier as a mouth breather may lead to allergies & respiratory concerns he is experiencing   Goals Addressed               This Visit's Progress     Patient Stated     Home Management of Obstructive sleep apnea/allergies, diabetes, Congestive heart  Failure/Chronic Kidney Disease,  (RN  CM) (pt-stated)        01/09/24 Paitent will follow action plan discussed to decrease respiratory symptoms Managing diabetes well at home. Minimal left side back pain.  Decrease symptoms with congestive heart failure - 12/07/23 doing better - no swelling noted  Patient will outreach to RN CM as needed related to completing his Marcelline Deist medication assistance application 12/07/23 patient will try recommendations to decrease early morning GERD symptoms & updated RN CM   Interventions Today    Flowsheet Row Most Recent Value  Chronic Disease   Chronic disease during today's visit Diabetes, Congestive Heart Failure (CHF), Chronic Kidney Disease/End Stage Renal Disease (ESRD)  General Interventions   General Interventions Discussed/Reviewed General Interventions Reviewed, Durable Medical Equipment (DME), Sick Day Rules, Doctor Visits, Communication with  [he patient experienced a restless night with coughing and sputum issues. He consulted pcp who discussed that GERD (gastroesophageal reflux disease) may be the cause. He was offered medicine but is cautious to use if regarding kidney concerns]  Doctor Visits Discussed/Reviewed Doctor Visits Reviewed, PCP, Specialist  Communication with --  Stann Mainland attempt to identify if pcp pharmacy staff can assist patient with monitoring medications that may cause further Kidney injury & updated patient]  Exercise Interventions   Exercise Discussed/Reviewed Exercise Reviewed, Physical Activity  Physical Activity Discussed/Reviewed Physical Activity Reviewed, Types of exercise  Education Interventions   Education Provided Provided Education, Provided Printed Education  [he was encouraged to use  his CPAP & humidifier at home to manage his respiratory symptoms + discuss not wanting to use GERD medicine with pcp Disuussed the how not using his devices will lead to allergy and respiratory symptoms,]  Provided Verbal Education On  Nutrition, Sick Day Rules, When to see the doctor, Medication, Exercise, Other  [discussed triggers, minimizing exposure to triggers, confirmed his vaccines are up to date Assisted with developing an action plan to use his Humidifier, CPAP, allergy medications if he feels it will not cause side effects to his kidneys,staying hydrated]  Mental Health Interventions   Mental Health Discussed/Reviewed Mental Health Reviewed, Coping Strategies  Nutrition Interventions   Nutrition Discussed/Reviewed Nutrition Discussed, Fluid intake  Pharmacy Interventions   Pharmacy Dicussed/Reviewed Pharmacy Topics Reviewed, Medications and their functions, Medication Adherence, Affording Medications  [Provided education in 01/09/24 wrap up for allergies, CPAP, humidifier, sinus rinse]  Medication Adherence Not taking medication  [no using CPAP, Humidifier discussed]              SDOH assessments and interventions completed:  No     Care Coordination Interventions:  Yes, provided   Follow up plan: Follow up call scheduled for 02/07/24 1115    Encounter Outcome:  Patient Visit Completed   Cala Bradford L. Noelle Penner, RN, BSN, CCM [place]  [facility], [facility] RN Care Manager Direct Dial: [phone]  Fax: [phone] Mailing Address: [address]  [place] [address] Website: [email or web address]

## 2024-01-10 NOTE — Patient Instructions (Addendum)
 Visit Information  Thank you for taking time to visit with me today. Please don't hesitate to contact me if I can be of assistance to you.   Following are the goals we discussed today:   Goals Addressed               This Visit's Progress     Patient Stated     Home Management of Obstructive sleep apnea/allergies, diabetes, Congestive heart Failure/Chronic Kidney Disease,  (RN  CM) (pt-stated)        01/09/24 Paitent will follow action plan discussed to decrease respiratory symptoms Managing diabetes well at home. Minimal left side back pain.  Decrease symptoms with congestive heart failure - 12/07/23 doing better - no swelling noted  Patient will outreach to RN CM as needed related to completing his Marcelline Deist medication assistance application 12/07/23 patient will try recommendations to decrease early morning GERD symptoms & updated RN CM   Interventions Today    Flowsheet Row Most Recent Value  Chronic Disease   Chronic disease during today's visit Diabetes, Congestive Heart Failure (CHF), Chronic Kidney Disease/End Stage Renal Disease (ESRD)  General Interventions   General Interventions Discussed/Reviewed General Interventions Reviewed, Durable Medical Equipment (DME), Sick Day Rules, Doctor Visits, Communication with  [he patient experienced a restless night with coughing and sputum issues. He consulted pcp who discussed that GERD (gastroesophageal reflux disease) may be the cause. He was offered medicine but is cautious to use if regarding kidney concerns]  Doctor Visits Discussed/Reviewed Doctor Visits Reviewed, PCP, Specialist  Communication with --  Stann Mainland attempt to identify if pcp pharmacy staff can assist patient with monitoring medications that may cause further Kidney injury & updated patient]  Exercise Interventions   Exercise Discussed/Reviewed Exercise Reviewed, Physical Activity  Physical Activity Discussed/Reviewed Physical Activity Reviewed, Types of exercise   Education Interventions   Education Provided Provided Education, Provided Printed Education  [he was encouraged to use his CPAP & humidifier at home to manage his respiratory symptoms + discuss not wanting to use GERD medicine with pcp Disuussed the how not using his devices will lead to allergy and respiratory symptoms,]  Provided Verbal Education On Nutrition, Sick Day Rules, When to see the doctor, Medication, Exercise, Other  [discussed triggers, minimizing exposure to triggers, confirmed his vaccines are up to date Assisted with developing an action plan to use his Humidifier, CPAP, allergy medications if he feels it will not cause side effects to his kidneys,staying hydrated]  Mental Health Interventions   Mental Health Discussed/Reviewed Mental Health Reviewed, Coping Strategies  Nutrition Interventions   Nutrition Discussed/Reviewed Nutrition Discussed, Fluid intake  Pharmacy Interventions   Pharmacy Dicussed/Reviewed Pharmacy Topics Reviewed, Medications and their functions, Medication Adherence, Affording Medications  [Provided education in 01/09/24 wrap up for allergies, CPAP, humidifier, sinus rinse]  Medication Adherence Not taking medication  [no using CPAP, Humidifier discussed]              Our next appointment is by telephone on 02/07/24 at 1115  Please call the care guide team at [phone] if you need to cancel or reschedule your appointment.   If you are experiencing a Mental Health or Behavioral Health Crisis or need someone to talk to, please call the Suicide and Crisis Lifeline: 988 call the [place] National Suicide Prevention Lifeline: 5874157945 or TTY: 860-531-8668 TTY 814-563-1151) to talk to a trained counselor call 1-800-273-TALK (toll free, 24 hour hotline) call the [facility]: 6168151603 call 911   Patient verbalizes understanding of  instructions and care plan provided today and agrees to view in MyChart. Active MyChart status and  patient understanding of how to access instructions and care plan via MyChart confirmed with patient.     The patient has been provided with contact information for the care management team and has been advised to call with any health related questions or concerns.   Provided education in 01/09/24 wrap up for allergies, CPAP, humidifier, sinus rinse  [name] L. Noelle Penner, RN, BSN, CCM [place]  [facility], [facility] RN Care Manager Direct Dial: [phone]  Fax: [phone] Mailing Address: [address]  [place] [address] Website: [email or web address]

## 2024-02-03 ENCOUNTER — Ambulatory Visit: Payer: Medicare HMO | Admitting: Family Medicine

## 2024-02-03 ENCOUNTER — Encounter: Payer: Self-pay | Admitting: Family Medicine

## 2024-02-03 NOTE — Progress Notes (Deleted)
 Subjective: WJ:XBJYN PCP: [name], DO WGN:[id number] Shaymus Eveleth is a 68 y.o. male presenting to clinic today for:  1. Cough Trialed on PPI. Here for 1 month follow up. 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 For neuropathy, Disp: 90 capsule, Rfl: 3   amLODipine (NORVASC) 10 MG tablet, Take 1 tablet (10 mg total) by mouth daily. Per [facility] Hai renal, Disp: , Rfl:    aspirin 81 MG EC tablet, Take 1 tablet (81 mg total) by mouth daily. Swallow whole., Disp: 90 tablet, Rfl: 3   atorvastatin (LIPITOR) 80 MG tablet, Take 1 tablet (80 mg total) by mouth daily., Disp: 90 tablet, Rfl: 3   carvedilol (COREG) 12.5 MG tablet, TAKE ONE TABLET TWICE DAILY WITH MEAL(S), Disp: 180 tablet, Rfl: 1   cloNIDine (CATAPRES) 0.3 MG tablet, TAKE (1) TABLET TWICE A DAY for blood pressure, Disp: 180 tablet, Rfl: 3   Continuous Blood Gluc Receiver (FREESTYLE LIBRE 2 READER) DEVI, UAD to monitor sugars continuously. E11.22, Disp: 1 each, Rfl: 0   Continuous Glucose Receiver (FREESTYLE LIBRE 3 READER) DEVI, Check BGs continuously E11.22, N18.2, Z79.4, Disp: 1 each, Rfl: 0   Continuous Glucose Sensor (FREESTYLE LIBRE 2 SENSOR) MISC, USE AS DIRECTED TO CONTINUOUSLY MONITOR SUGARS, Disp: 6 each, Rfl: 3   Continuous Glucose Sensor (FREESTYLE LIBRE 3 PLUS SENSOR) MISC, Check BG continuously E11.22, N18.2, Z79.4. Change sensor every 15 days., Disp: 6 each,  Rfl: 3   dapagliflozin propanediol (FARXIGA) 10 MG TABS tablet, Take 1 tablet (10 mg total) by mouth daily before breakfast., Disp: 90 tablet, Rfl: 5   furosemide (LASIX) 40 MG tablet, Take 1 tablet (40 mg total) by mouth daily., Disp: 90 tablet, Rfl: 3   gabapentin (NEURONTIN) 100 MG capsule, Take 1 capsule (100 mg total) by mouth 2 (two) times daily., Disp: 180 capsule, Rfl: 3   insulin degludec (TRESIBA FLEXTOUCH) 100 UNIT/ML FlexTouch Pen, INJECT 38 UP TO 50 UNITS DAILY AS DIRECTED., Disp: 45 mL, Rfl: 3   Insulin Pen Needle (PEN NEEDLES) 31G X 5 MM MISC, Use daily with insulin Dx E11.9, Disp: 100 each, Rfl: 3   isosorbide mononitrate (IMDUR) 60 MG 24 hr tablet, Take 1 tablet (60 mg total) by mouth daily., Disp: 90 tablet, Rfl: 3   losartan (COZAAR) 25 MG tablet, Take 25 mg by mouth daily. 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 Has another son and one daughter    Raise cattle    Social Drivers of Health   Financial Resource Strain: Low Risk   (03/15/2023)   Overall Financial Resource Strain (CARDIA)    Difficulty of Paying Living Expenses: Not hard at all  Food Insecurity: No Food Insecurity (03/15/2023)   Hunger Vital Sign    Worried About Running Out of Food in the Last Year: Never true    Ran Out of Food in the Last Year: Never true  Transportation Needs: No Transportation Needs (03/15/2023)   PRAPARE - Administrator, Civil Service (Medical): No    Lack of Transportation (Non-Medical): No  Physical Activity: Sufficiently Active (03/15/2023)   Exercise Vital Sign    Days of Exercise per Week: 5 days    Minutes of Exercise per Session: 60 min  Stress: No Stress Concern Present (03/15/2023)   [employer] of Occupational Health - Occupational Stress Questionnaire    Feeling of Stress : Not at all  Social Connections: Moderately Isolated (03/15/2023)   Social Connection and Isolation Panel [NHANES]    Frequency of Communication with Friends and Family: More than three times a week    Frequency of Social Gatherings with Friends and Family: More than three times a week    Attends Religious Services: Never    Database administrator or Organizations: No    Attends Banker Meetings: Never    Marital Status: Married  Catering manager Violence: Not At Risk (03/15/2023)   Humiliation, Afraid, Rape, and Kick questionnaire    Fear of Current or Ex-Partner: No    Emotionally Abused: No    Physically Abused: No    Sexually Abused: No   Family History  Problem Relation Age of Onset   Heart attack Father 38       Died suddenly   Hypertension Father    Vision loss Sister        one eye   Cancer Brother        skin   Diabetes Brother     Objective: Office vital signs reviewed. There were no vitals taken for this visit.  Physical Examination:  General: Awake, alert, *** nourished, No acute distress HEENT: Normal    Neck: No masses palpated. No lymphadenopathy    Ears: Tympanic membranes intact,  normal light reflex, no erythema, no bulging    Eyes: PERRLA, extraocular membranes intact, sclera ***    Nose: nasal turbinates moist, *** nasal discharge    Throat: moist mucus membranes, no erythema, *** tonsillar exudate.  Airway is patent Cardio: regular rate and rhythm, S1S2 heard, no murmurs appreciated Pulm: clear to auscultation bilaterally, no wheezes, rhonchi or rales; normal work of breathing on room air GI: soft, non-tender, non-distended, bowel sounds present x4, no hepatomegaly, no  splenomegaly, no masses GU: external vaginal tissue ***, cervix ***, *** punctate lesions on cervix appreciated, *** discharge from cervical os, *** bleeding, *** cervical motion tenderness, *** abdominal/ adnexal masses Extremities: warm, well perfused, No edema, cyanosis or clubbing; +*** pulses bilaterally MSK: *** gait and *** station Skin: dry; intact; no rashes or lesions Neuro: *** Strength and light touch sensation grossly intact, *** DTRs ***/4  Assessment/ Plan: 68 y.o. male   Cough present for greater than 3 weeks  ***   Brentton Wardlow M Zenia Guest, DO [facility] [phone]

## 2024-02-07 ENCOUNTER — Ambulatory Visit: Payer: Self-pay | Admitting: *Deleted

## 2024-02-07 NOTE — Patient Outreach (Signed)
 Care Coordination   02/07/2024 Name: [name] MRN: [id number] DOB: 12/31/1955   Care Coordination Outreach Attempts:  An unsuccessful outreach was attempted for an appointment today.  Follow Up Plan:  Additional outreach attempts will be made to offer the patient complex care management information and services.   Encounter Outcome:  No Answer   Care Coordination Interventions:  Yes, provided    Odelle Kosier L. Noelle Penner, RN, BSN, CCM   [facility], [facility] RN Care Manager Direct Dial: [phone]  Fax: [phone]

## 2024-02-16 ENCOUNTER — Other Ambulatory Visit: Payer: Self-pay | Admitting: *Deleted

## 2024-02-16 ENCOUNTER — Telehealth: Payer: Self-pay | Admitting: Family Medicine

## 2024-02-16 NOTE — Patient Instructions (Signed)
 Visit Information  Thank you for taking time to visit with me today. Please don't hesitate to contact me if I can be of assistance to you before our next scheduled telephone appointment.  Following are the goals we discussed today:   Goals Addressed             This Visit's Progress    RNCM Care Management Expected Outcomes: Monitor, Self-Manage and Reduce Symptoms of: DM, CHF, CKD       Current Barriers:  Chronic Disease Management support and education needs related to CHF, DMII, and CKD Stage G3B/A2   RNCM Clinical Goal(s):  Patient will verbalize basic understanding of  CHF, DMII, and CKD Stage G3B/A2 disease process and self health management plan as evidenced by verbal explanation, recognizing symptoms, lifestyle modifications take all medications exactly as prescribed and will call provider for medication related questions as evidenced by compliance with all medications attend all scheduled medical appointments: with primary care provider and specialists as evidenced by keeping all scheduled appointments demonstrate Improved and Ongoing adherence to prescribed treatment plan for CHF, DMII, and CKD Stage G3B/A2 as evidenced by consistent medication compliance, symptom monitoring, continued lifestyle changes continue to work with RN Care Manager to address care management and care coordination needs related to  CHF, DMII, and CKD Stage G3B/A2 as evidenced by adherence to CM Team Scheduled appointments through collaboration with RN Care manager, provider, and care team.   Interventions: Evaluation of current treatment plan related to  self management and patient's adherence to plan as established by provider   Heart Failure Interventions:  (Status:  Goal on track:  Yes.) Long Term Goal Basic overview and discussion of pathophysiology of Heart Failure reviewed Provided education on low sodium diet Reviewed Heart Failure Action Plan in depth and provided written copy Assessed need  for readable accurate scales in home Provided education about placing scale on hard, flat surface Advised patient to weigh each morning after emptying bladder Discussed importance of daily weight and advised patient to weigh and record daily Reviewed role of diuretics in prevention of fluid overload and management of heart failure; Discussed the importance of keeping all appointments with provider Provided patient with education about the role of exercise in the management of heart failure Advised patient to discuss acute changes with provider Screening for signs and symptoms of depression related to chronic disease state  Assessed social determinant of health barriers    Chronic Kidney Disease Interventions:  (Status:  Goal on track:  Yes.) Long Term Goal Assessed the Patient understanding of chronic kidney disease    Evaluation of current treatment plan related to chronic kidney disease self management and patient's adherence to plan as established by provider      Provided education to patient re: stroke prevention, s/s of heart attack and stroke    Reviewed medications with patient and discussed importance of compliance    Counseled on adverse effects of illicit drug and excessive alcohol use in patients with chronic kidney disease    Advised patient, providing education and rationale, to monitor blood pressure daily and record, calling PCP for findings outside established parameters    Discussed complications of poorly controlled blood pressure such as heart disease, stroke, circulatory complications, vision complications, kidney impairment, sexual dysfunction    Reviewed scheduled/upcoming provider appointments including    Discussed plans with patient for ongoing care management follow up and provided patient with direct contact information for care management team    Screening for signs and  symptoms of depression related to chronic disease state      Discussed the impact of chronic  kidney disease on daily life and mental health and acknowledged and normalized feelings of disempowerment, fear, and frustration    Assessed social determinant of health barriers    Provided education on kidney disease progression    Last practice recorded BP readings:  BP Readings from Last 3 Encounters:  01/06/24 (!) 140/55  09/05/23 (!) 140/65  06/15/23 (!) 141/68   Most recent eGFR/CrCl:  Lab Results  Component Value Date   EGFR 23 (L) 01/06/2024    No components found for: "CRCL"    Diabetes Interventions:  (Status:  Goal on track:  Yes.) Long Term Goal Assessed patient's understanding of A1c goal: <6.5% Provided education to patient about basic DM disease process Reviewed medications with patient and discussed importance of medication adherence Counseled on importance of regular laboratory monitoring as prescribed Discussed plans with patient for ongoing care management follow up and provided patient with direct contact information for care management team Provided patient with written educational materials related to hypo and hyperglycemia and importance of correct treatment Reviewed scheduled/upcoming provider appointments including: 03-19-2024 AWV Advised patient, providing education and rationale, to check cbg with freestyle Libre and record, calling provider for findings outside established parameters Review of patient status, including review of consultants reports, relevant laboratory and other test results, and medications completed Screening for signs and symptoms of depression related to chronic disease state  Assessed social determinant of health barriers Lab Results  Component Value Date   HGBA1C 6.1 (H) 01/06/2024    Patient Goals/Self-Care Activities: Take all medications as prescribed Attend all scheduled provider appointments Call pharmacy for medication refills 3-7 days in advance of running out of medications Attend church or other social  activities Perform all self care activities independently  Perform IADL's (shopping, preparing meals, housekeeping, managing finances) independently Call provider office for new concerns or questions  call office if I gain more than 2 pounds in one day or 5 pounds in one week use salt in moderation watch for swelling in feet, ankles and legs every day weigh myself daily check feet daily for cuts, sores or redness enter blood sugar readings and medication or insulin into daily log manage portion size  Follow Up Plan:  Telephone follow up appointment with care management team member scheduled for:  03-19-2024 at 9:45 am           Our next appointment is by telephone on 03-19-2024 at 9:45 am  Please call the care guide team at [phone] if you need to cancel or reschedule your appointment.   If you are experiencing a Mental Health or Behavioral Health Crisis or need someone to talk to, please call the Suicide and Crisis Lifeline: 988 call the [place] National Suicide Prevention Lifeline: 820-493-9461 or TTY: 519-032-5648 TTY (216)352-7965) to talk to a trained counselor call 1-800-273-TALK (toll free, 24 hour hotline)   Patient verbalizes understanding of instructions and care plan provided today and agrees to view in MyChart. Active MyChart status and patient understanding of how to access instructions and care plan via MyChart confirmed with patient.     Telephone follow up appointment with care management team member scheduled for:03-19-2024 at 9:45 am  Danise Edge, BSN RN [facility], [facility] RN Care Manager Direct Dial: [phone]  Fax: [phone]

## 2024-02-16 NOTE — Patient Outreach (Signed)
 Care Management   Visit Note  02/16/2024 Name: [name] MRN: [id number] DOB: [birth year]  Subjective: [name] is a 68 y.o. year old male who is a primary care patient of [name], DO. The Care Management team was consulted for assistance.      Engaged with patient spoke with patient by telephone.    Goals Addressed             This Visit's Progress    RNCM Care Management Expected Outcomes: Monitor, Self-Manage and Reduce Symptoms of: DM, CHF, CKD       Current Barriers:  Chronic Disease Management support and education needs related to CHF, DMII, and CKD Stage G3B/A2   RNCM Clinical Goal(s):  Patient will verbalize basic understanding of  CHF, DMII, and CKD Stage G3B/A2 disease process and self health management plan as evidenced by verbal explanation, recognizing symptoms, lifestyle modifications take all medications exactly as prescribed and will call provider for medication related questions as evidenced by compliance with all medications attend all scheduled medical appointments: with primary care provider and specialists as evidenced by keeping all scheduled appointments demonstrate Improved and Ongoing adherence to prescribed treatment plan for CHF, DMII, and CKD Stage G3B/A2 as evidenced by consistent medication compliance, symptom monitoring, continued lifestyle changes continue to work with RN Care Manager to address care management and care coordination needs related to  CHF, DMII, and CKD Stage G3B/A2 as evidenced by adherence to CM Team Scheduled appointments through collaboration with RN Care manager, provider, and care team.   Interventions: Evaluation of current treatment plan related to  self management and patient's adherence to plan as established by provider   Heart Failure Interventions:  (Status:  Goal on track:  Yes.) Long Term Goal Basic overview and discussion of pathophysiology of Heart Failure reviewed Provided education  on low sodium diet Reviewed Heart Failure Action Plan in depth and provided written copy Assessed need for readable accurate scales in home Provided education about placing scale on hard, flat surface Advised patient to weigh each morning after emptying bladder Discussed importance of daily weight and advised patient to weigh and record daily Reviewed role of diuretics in prevention of fluid overload and management of heart failure; Discussed the importance of keeping all appointments with provider Provided patient with education about the role of exercise in the management of heart failure Advised patient to discuss acute changes with provider Screening for signs and symptoms of depression related to chronic disease state  Assessed social determinant of health barriers    Chronic Kidney Disease Interventions:  (Status:  Goal on track:  Yes.) Long Term Goal Assessed the Patient understanding of chronic kidney disease    Evaluation of current treatment plan related to chronic kidney disease self management and patient's adherence to plan as established by provider      Provided education to patient re: stroke prevention, s/s of heart attack and stroke    Reviewed medications with patient and discussed importance of compliance    Counseled on adverse effects of illicit drug and excessive alcohol use in patients with chronic kidney disease    Advised patient, providing education and rationale, to monitor blood pressure daily and record, calling PCP for findings outside established parameters    Discussed complications of poorly controlled blood pressure such as heart disease, stroke, circulatory complications, vision complications, kidney impairment, sexual dysfunction    Reviewed scheduled/upcoming provider appointments including    Discussed plans with patient for ongoing care management  follow up and provided patient with direct contact information for care management team    Screening for  signs and symptoms of depression related to chronic disease state      Discussed the impact of chronic kidney disease on daily life and mental health and acknowledged and normalized feelings of disempowerment, fear, and frustration    Assessed social determinant of health barriers    Provided education on kidney disease progression    Last practice recorded BP readings:  BP Readings from Last 3 Encounters:  01/06/24 (!) 140/55  09/05/23 (!) 140/65  06/15/23 (!) 141/68   Most recent eGFR/CrCl:  Lab Results  Component Value Date   EGFR 23 (L) 01/06/2024    No components found for: "CRCL"    Diabetes Interventions:  (Status:  Goal on track:  Yes.) Long Term Goal Assessed patient's understanding of A1c goal: <6.5% Provided education to patient about basic DM disease process Reviewed medications with patient and discussed importance of medication adherence Counseled on importance of regular laboratory monitoring as prescribed Discussed plans with patient for ongoing care management follow up and provided patient with direct contact information for care management team Provided patient with written educational materials related to hypo and hyperglycemia and importance of correct treatment Reviewed scheduled/upcoming provider appointments including: 03-19-2024 AWV Advised patient, providing education and rationale, to check cbg with freestyle Libre and record, calling provider for findings outside established parameters Review of patient status, including review of consultants reports, relevant laboratory and other test results, and medications completed Screening for signs and symptoms of depression related to chronic disease state  Assessed social determinant of health barriers Lab Results  Component Value Date   HGBA1C 6.1 (H) 01/06/2024    Patient Goals/Self-Care Activities: Take all medications as prescribed Attend all scheduled provider appointments Call pharmacy for medication  refills 3-7 days in advance of running out of medications Attend church or other social activities Perform all self care activities independently  Perform IADL's (shopping, preparing meals, housekeeping, managing finances) independently Call provider office for new concerns or questions  call office if I gain more than 2 pounds in one day or 5 pounds in one week use salt in moderation watch for swelling in feet, ankles and legs every day weigh myself daily check feet daily for cuts, sores or redness enter blood sugar readings and medication or insulin into daily log manage portion size  Follow Up Plan:  Telephone follow up appointment with care management team member scheduled for:  03-19-2024 at 9:45 am           Consent to Services:  Patient was given information about care management services, agreed to services, and gave verbal consent to participate.   Plan: Telephone follow up appointment with care management team member scheduled for:03-19-2024 at 9:45 am  Danise Edge, BSN RN [facility], [facility] RN Care Manager Direct Dial: [phone]  Fax: [phone]

## 2024-02-16 NOTE — Telephone Encounter (Signed)
 Apt scheduled  Copied from CRM [id number]. Topic: General - Other >> Feb 16, 2024 10:31 AM Nyra Capes wrote: Reason for CRM: patient wife Elease Hashimoto calling in to schedule labs they have the paper lab orders.  The labs are not in our system and are ordered from another provider. Unable to schedule lab appt.

## 2024-02-17 ENCOUNTER — Other Ambulatory Visit

## 2024-02-22 ENCOUNTER — Other Ambulatory Visit

## 2024-02-22 DIAGNOSIS — N189 Chronic kidney disease, unspecified: Secondary | ICD-10-CM | POA: Diagnosis not present

## 2024-02-22 DIAGNOSIS — D631 Anemia in chronic kidney disease: Secondary | ICD-10-CM | POA: Diagnosis not present

## 2024-02-22 DIAGNOSIS — D649 Anemia, unspecified: Secondary | ICD-10-CM | POA: Diagnosis not present

## 2024-02-22 DIAGNOSIS — R809 Proteinuria, unspecified: Secondary | ICD-10-CM | POA: Diagnosis not present

## 2024-02-29 DIAGNOSIS — I5032 Chronic diastolic (congestive) heart failure: Secondary | ICD-10-CM | POA: Diagnosis not present

## 2024-02-29 DIAGNOSIS — E211 Secondary hyperparathyroidism, not elsewhere classified: Secondary | ICD-10-CM | POA: Diagnosis not present

## 2024-02-29 DIAGNOSIS — E876 Hypokalemia: Secondary | ICD-10-CM | POA: Diagnosis not present

## 2024-02-29 DIAGNOSIS — N184 Chronic kidney disease, stage 4 (severe): Secondary | ICD-10-CM | POA: Diagnosis not present

## 2024-03-19 ENCOUNTER — Ambulatory Visit: Payer: Medicare HMO

## 2024-03-19 ENCOUNTER — Other Ambulatory Visit: Payer: Self-pay | Admitting: *Deleted

## 2024-03-19 ENCOUNTER — Other Ambulatory Visit: Payer: Self-pay

## 2024-03-19 VITALS — Ht 73.0 in | Wt 221.0 lb

## 2024-03-19 DIAGNOSIS — Z0000 Encounter for general adult medical examination without abnormal findings: Secondary | ICD-10-CM | POA: Diagnosis not present

## 2024-03-19 NOTE — Progress Notes (Signed)
 Subjective:   [name] is a 68 y.o. who presents for a Medicare Wellness preventive visit.  Visit Complete: Virtual I connected with  Nathaneil Bakes on 03/19/24 by a audio enabled telemedicine application and verified that I am speaking with the correct person using two identifiers.  Patient Location: Home  Provider Location: Home Office  I discussed the limitations of evaluation and management by telemedicine. The patient expressed understanding and agreed to proceed.  Vital Signs: Because this visit was a virtual/telehealth visit, some criteria may be missing or patient reported. Any vitals not documented were not able to be obtained and vitals that have been documented are patient reported.  VideoDeclined- This patient declined Librarian, academic. Therefore the visit was completed with audio only.  Persons Participating in Visit: Patient.  AWV Questionnaire: No: Patient Medicare AWV questionnaire was not completed prior to this visit.  Cardiac Risk Factors include: advanced age (>65men, >13 women);diabetes mellitus;dyslipidemia;hypertension;male gender     Objective:    Today's Vitals   03/19/24 1234 03/19/24 1237  Weight: 221 lb (100.2 kg)   Height: 6\' 1"  (1.854 m)   PainSc:  2    Body mass index is 29.16 kg/m.     03/19/2024   12:41 PM 03/19/2024   10:15 AM 05/18/2023   12:30 PM 05/16/2023    1:21 PM 03/15/2023   10:30 AM 01/11/2023    1:15 PM 03/11/2022   10:44 AM  Advanced Directives  Does Patient Have a Medical Advance Directive? No No No No No No No  Would patient like information on creating a medical advance directive? Yes (MAU/Ambulatory/Procedural Areas - Information given) No - Patient declined No - Patient declined No - Patient declined No - Patient declined  No - Patient declined    Current Medications (verified) Outpatient Encounter Medications as of 03/19/2024  Medication Sig   Alpha-Lipoic Acid 600 MG CAPS Take  1 capsule (600 mg total) by mouth in the morning. For neuropathy   amLODipine  (NORVASC ) 10 MG tablet Take 1 tablet (10 mg total) by mouth daily. Per [facility] Husky renal   aspirin  81 MG EC tablet Take 1 tablet (81 mg total) by mouth daily. Swallow whole.   atorvastatin  (LIPITOR ) 80 MG tablet Take 1 tablet (80 mg total) by mouth daily.   carvedilol  (COREG ) 12.5 MG tablet TAKE ONE TABLET TWICE DAILY WITH MEAL(S)   cloNIDine  (CATAPRES ) 0.3 MG tablet TAKE (1) TABLET TWICE A DAY for blood pressure   dapagliflozin  propanediol (FARXIGA ) 10 MG TABS tablet Take 1 tablet (10 mg total) by mouth daily before breakfast.   furosemide  (LASIX ) 40 MG tablet Take 1 tablet (40 mg total) by mouth daily.   gabapentin  (NEURONTIN ) 100 MG capsule Take 1 capsule (100 mg total) by mouth 2 (two) times daily.   insulin  degludec (TRESIBA  FLEXTOUCH) 100 UNIT/ML FlexTouch Pen INJECT 38 UP TO 50 UNITS DAILY AS DIRECTED.   Insulin  Pen Needle (PEN NEEDLES) 31G X 5 MM MISC Use daily with insulin  Dx E11.9   isosorbide  mononitrate (IMDUR ) 60 MG 24 hr tablet Take 1 tablet (60 mg total) by mouth daily.   losartan (COZAAR) 25 MG tablet Take 25 mg by mouth daily. Per renal   nitroGLYCERIN  (NITROSTAT ) 0.4 MG SL tablet Place 1 tablet (0.4 mg total) under the tongue every 5 (five) minutes as needed for chest pain.   spironolactone  (ALDACTONE ) 25 MG tablet Take 25 mg by mouth daily. Take 1/2 tablet daily.   Continuous Blood  Gluc Receiver (FREESTYLE LIBRE 2 READER) DEVI UAD to monitor sugars continuously. E11.22 (Patient not taking: Reported on 03/19/2024)   Continuous Glucose Receiver (FREESTYLE LIBRE 3 READER) DEVI Check BGs continuously E11.22, N18.2, Z79.4 (Patient not taking: Reported on 03/19/2024)   Continuous Glucose Sensor (FREESTYLE LIBRE 2 SENSOR) MISC USE AS DIRECTED TO CONTINUOUSLY MONITOR SUGARS (Patient not taking: Reported on 03/19/2024)   Continuous Glucose Sensor (FREESTYLE LIBRE 3 PLUS SENSOR) MISC Check BG continuously E11.22,  N18.2, Z79.4. Change sensor every 15 days. (Patient not taking: Reported on 03/19/2024)   pantoprazole  (PROTONIX ) 40 MG tablet Take 1 tablet (40 mg total) by mouth daily.   No facility-administered encounter medications on file as of 03/19/2024.    Allergies (verified) Patient has no known allergies.   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   Chronic kidney disease    Complication of anesthesia    Hard to wake   Degenerative joint disease (DJD) of lumbar spine    Diabetes mellitus without complication (HCC)    Type II   Essential hypertension 02/23/2017   Lumbar stenosis    NSTEMI (non-ST elevated myocardial infarction) (HCC) 06/07/2021   Sleep apnea    Past Surgical History:  Procedure Laterality Date   AMPUTATION TOE Right 05/17/2020   Procedure: AMPUTATION TOE partial right great toe;  Surgeon: Camilo Cella, DPM;  Location: [facility] ORS;  Service: Podiatry;  Laterality: Right;   BELPHAROPTOSIS REPAIR     CARDIAC CATHETERIZATION     CORONARY STENT INTERVENTION N/A 06/11/2021   Procedure: CORONARY STENT INTERVENTION;  Surgeon: Swaziland, Peter M, MD;  Location: [facility] INVASIVE CV LAB;  Service: Cardiovascular;  Laterality: N/A;   CORONARY ULTRASOUND/IVUS N/A 06/11/2021   Procedure: Intravascular Ultrasound/IVUS;  Surgeon: Swaziland, Peter M, MD;  Location: [facility] INVASIVE CV LAB;  Service: Cardiovascular;  Laterality: N/A;   EYE SURGERY     FLEXIBLE SIGMOIDOSCOPY N/A 05/18/2023   Procedure: FLEXIBLE SIGMOIDOSCOPY;  Surgeon: Suzette Espy, MD;  Location: [facility] ENDO SUITE;  Service: Endoscopy;  Laterality: N/A;  2:00 PM, ASA 3   HEMORRHOID SURGERY     LEFT HEART CATH AND CORONARY ANGIOGRAPHY N/A 06/09/2021   Procedure: LEFT HEART CATH AND CORONARY ANGIOGRAPHY;  Surgeon: Swaziland, Peter M, MD;  Location: [facility] INVASIVE CV LAB;  Service: Cardiovascular;  Laterality: N/A;   LEFT HEART CATH AND CORONARY ANGIOGRAPHY N/A 07/23/2021    Procedure: LEFT HEART CATH AND CORONARY ANGIOGRAPHY;  Surgeon: Avanell Leigh, MD;  Location: [facility] INVASIVE CV LAB;  Service: Cardiovascular;  Laterality: N/A;   skin cancer removed right ear     TRANSFORAMINAL LUMBAR INTERBODY FUSION (TLIF) WITH PEDICLE SCREW FIXATION 2 LEVEL N/A 07/06/2018   Procedure: TRANSFORAMINAL LUMBAR INTERBODY FUSION (TLIF) L4-S1;  Surgeon: Mort Ards, MD;  Location: [facility] OR;  Service: Orthopedics;  Laterality: N/A;   Family History  Problem Relation Age of Onset   Heart attack Father 6       Died suddenly   Hypertension Father    Vision loss Sister        one eye   Cancer Brother        skin   Diabetes Brother    Social History   Socioeconomic History   Marital status: Married    Spouse name: Devra Fontana   Number of children: 3   Years of education: Not on file   Highest education level: Not on file  Occupational History   Occupation:  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 Has another son and one daughter    Raise cattle    Social Drivers of Health   Financial Resource Strain: Low Risk  (03/19/2024)   Overall Financial Resource Strain (CARDIA)    Difficulty of Paying Living Expenses: Not hard at all  Food Insecurity: No Food Insecurity (03/19/2024)   Hunger Vital Sign    Worried About Running Out of Food in the Last Year: Never true    Ran Out of Food in the Last Year: Never true  Transportation Needs: No Transportation Needs (03/19/2024)   PRAPARE - Administrator, Civil Service (Medical): No    Lack of Transportation  (Non-Medical): No  Physical Activity: Sufficiently Active (03/19/2024)   Exercise Vital Sign    Days of Exercise per Week: 5 days    Minutes of Exercise per Session: 30 min  Stress: No Stress Concern Present (03/19/2024)   [employer] of Occupational Health - Occupational Stress Questionnaire    Feeling of Stress : Not at all  Social Connections: Moderately Isolated (03/19/2024)   Social Connection and Isolation Panel [NHANES]    Frequency of Communication with Friends and Family: More than three times a week    Frequency of Social Gatherings with Friends and Family: Three times a week    Attends Religious Services: Never    Active Member of Clubs or Organizations: No    Attends Banker Meetings: Never    Marital Status: Married    Tobacco Counseling Counseling given: Not Answered    Clinical Intake:  Pre-visit preparation completed: Yes  Pain : No/denies pain Pain Score: 2  Pain Location: Abdomen Pain Descriptors / Indicators: Shooting     Diabetes: Yes CBG done?: No Did pt. bring in CBG monitor from home?: No  Lab Results  Component Value Date   HGBA1C 6.1 (H) 01/06/2024   HGBA1C 6.3 (H) 09/05/2023   HGBA1C 6.1 (H) 05/03/2023     How often do you need to have someone help you when you read instructions, pamphlets, or other written materials from your doctor or pharmacy?: 1 - Never  Interpreter Needed?: No  Information entered by :: Seabron Cypress LPN   Activities of Daily Living     03/19/2024   12:38 PM 05/16/2023    1:25 PM  In your present state of health, do you have any difficulty performing the following activities:  Hearing? 0   Vision? 0   Difficulty concentrating or making decisions? 0   Walking or climbing stairs? 0   Dressing or bathing? 0   Doing errands, shopping? 0 0  Preparing Food and eating ? N   Using the Toilet? N   In the past six months, have you accidently leaked urine? N   Do you have problems with loss of bowel  control? N   Managing your Medications? N   Managing your Finances? N  Housekeeping or managing your Housekeeping? N     Patient Care Team: Eliodoro Guerin, DO as PCP - General (Family Medicine) Eilleen Grates, MD as PCP - Cardiology (Cardiology) Jane Meager, MD as Consulting Physician (Nephrology) Aleen Huron, MD as Consulting Physician (Dermatology) Charity Conch, DPM as Consulting Physician (Podiatry) Delilah Fend, [facility] as Pharmacist (Family Medicine) Mort Ards, MD as Consulting Physician (Orthopedic Surgery) Remona Carmel, RN as VBCI Care Management  Indicate any recent Medical Services you may have received from other than [facility] providers in the past year (date may be approximate).     Assessment:   This is a routine wellness examination for Volta.  Hearing/Vision screen Hearing Screening - Comments:: Denies hearing difficulties   Vision Screening - Comments:: No vision problems; will schedule routine eye exam soon     Goals Addressed             This Visit's Progress    DIET - EAT MORE FRUITS AND VEGETABLES   On track    Limit sweets and carbs Diabetes Mellitus and Nutrition, Adult When you have diabetes, or diabetes mellitus, it is very important to have healthy eating habits because your blood sugar (glucose) levels are greatly affected by what you eat and drink. Eating healthy foods in the right amounts, at about the same times every day, can help you: Control your blood glucose. Lower your risk of heart disease. Improve your blood pressure. Reach or maintain a healthy weight. What can affect my meal plan? Every person with diabetes is different, and each person has different needs for a meal plan. Your health care provider may recommend that you work with a dietitian to make a meal plan that is best for you. Your meal plan may vary depending on factors such as: The calories you need. The medicines you take. Your  weight. Your blood glucose, blood pressure, and cholesterol levels. Your activity level. Other health conditions you have, such as heart or kidney disease. How do carbohydrates affect me? Carbohydrates, also called carbs, affect your blood glucose level more than any other type of food. Eating carbs naturally raises the amount of glucose in your blood. Carb counting is a method for keeping track of how many carbs you eat. Counting carbs is important to keep your blood glucose at a healthy level, especially if you use insulin  or take certain oral diabetes medicines. It is important to know how many carbs you can safely have in each meal. This is different for every person. Your dietitian can help you calculate how many carbs you should have at each meal and for each snack. How does alcohol affect me? Alcohol can cause a sudden decrease in blood glucose (hypoglycemia), especially if you use insulin  or take certain oral diabetes medicines. Hypoglycemia can be a life-threatening condition. Symptoms of hypoglycemia, such as sleepiness, dizziness, and confusion, are similar to symptoms of having too much alcohol. Do not drink alcohol if: Your health care provider tells you not to drink. You are pregnant, may be pregnant, or are planning to become pregnant. If you drink alcohol: Do not drink on an empty stomach. Limit how much you use to: 0-1 drink a day for women. 0-2 drinks a day for men. Be aware of how much alcohol is in your drink. In the [place] one drink equals one 12 oz bottle of beer (355 mL), one 5 oz glass of wine (148 mL), or one 1 oz glass of hard liquor (44  mL). Keep yourself hydrated with water, diet soda, or unsweetened iced tea. Keep in mind that regular soda, juice, and other mixers may contain a lot of sugar and must be counted as carbs. What are tips for following this plan? Reading food labels Start by checking the serving size on the "Nutrition Facts" label of packaged foods and  drinks. The amount of calories, carbs, fats, and other nutrients listed on the label is based on one serving of the item. Many items contain more than one serving per package. Check the total grams (g) of carbs in one serving. You can calculate the number of servings of carbs in one serving by dividing the total carbs by 15. For example, if a food has 30 g of total carbs per serving, it would be equal to 2 servings of carbs. Check the number of grams (g) of saturated fats and trans fats in one serving. Choose foods that have a low amount or none of these fats. Check the number of milligrams (mg) of salt (sodium) in one serving. Most people should limit total sodium intake to less than 2,300 mg per day. Always check the nutrition information of foods labeled as "low-fat" or "nonfat." These foods may be higher in added sugar or refined carbs and should be avoided. Talk to your dietitian to identify your daily goals for nutrients listed on the label. Shopping Avoid buying canned, pre-made, or processed foods. These foods tend to be high in fat, sodium, and added sugar. Shop around the outside edge of the grocery store. This is where you will most often find fresh fruits and vegetables, bulk grains, fresh meats, and fresh dairy. Cooking Use low-heat cooking methods, such as baking, instead of high-heat cooking methods like deep frying. Cook using healthy oils, such as olive, canola, or sunflower oil. Avoid cooking with butter, cream, or high-fat meats. Meal planning Eat meals and snacks regularly, preferably at the same times every day. Avoid going long periods of time without eating. Eat foods that are high in fiber, such as fresh fruits, vegetables, beans, and whole grains. Talk with your dietitian about how many servings of carbs you can eat at each meal. Eat 4-6 oz (112-168 g) of lean protein each day, such as lean meat, chicken, fish, eggs, or tofu. One ounce (oz) of lean protein is equal to: 1 oz  (28 g) of meat, chicken, or fish. 1 egg.  cup (62 g) of tofu. Eat some foods each day that contain healthy fats, such as avocado, nuts, seeds, and fish.   What foods should I eat? Fruits Berries. Apples. Oranges. Peaches. Apricots. Plums. Grapes. Mango. Papaya. Pomegranate. Kiwi. Cherries. Vegetables Lettuce. Spinach. Leafy greens, including kale, chard, collard greens, and mustard greens. Beets. Cauliflower. Cabbage. Broccoli. Carrots. Green beans. Tomatoes. Peppers. Onions. Cucumbers. Brussels sprouts. Grains Whole grains, such as whole-wheat or whole-grain bread, crackers, tortillas, cereal, and pasta. Unsweetened oatmeal. Quinoa. Brown or wild rice. Meats and other proteins Seafood. Poultry without skin. Lean cuts of poultry and beef. Tofu. Nuts. Seeds. Dairy Low-fat or fat-free dairy products such as milk, yogurt, and cheese. The items listed above may not be a complete list of foods and beverages you can eat. Contact a dietitian for more information. What foods should I avoid? Fruits Fruits canned with syrup. Vegetables Canned vegetables. Frozen vegetables with butter or cream sauce. Grains Refined white flour and flour products such as bread, pasta, snack foods, and cereals. Avoid all processed foods. Meats and other proteins Fatty cuts  of meat. Poultry with skin. Breaded or fried meats. Processed meat. Avoid saturated fats. Dairy Full-fat yogurt, cheese, or milk. Beverages Sweetened drinks, such as soda or iced tea. The items listed above may not be a complete list of foods and beverages you should avoid. Contact a dietitian for more information. Questions to ask a health care provider Do I need to meet with a diabetes educator? Do I need to meet with a dietitian? What number can I call if I have questions? When are the best times to check my blood glucose? Where to find more information: American Diabetes Association: diabetes.org Academy of Nutrition and Dietetics:  www.eatright.[employer] of Diabetes and Digestive and Kidney Diseases: CarFlippers.tn Association of Diabetes Care and Education Specialists: www.diabeteseducator.org Summary It is important to have healthy eating habits because your blood sugar (glucose) levels are greatly affected by what you eat and drink. A healthy meal plan will help you control your blood glucose and maintain a healthy lifestyle. Your health care provider may recommend that you work with a dietitian to make a meal plan that is best for you. Keep in mind that carbohydrates (carbs) and alcohol have immediate effects on your blood glucose levels. It is important to count carbs and to use alcohol carefully. This information is not intended to replace advice given to you by your health care provider. Make sure you discuss any questions you have with your health care provider. Document Revised: 10/09/2019 Document Reviewed: 10/09/2019 Elsevier Patient Education  2021 Elsevier Inc.        Depression Screen     03/19/2024   12:40 PM 03/19/2024   10:15 AM 01/06/2024   11:05 AM 09/05/2023   11:17 AM 05/03/2023   11:07 AM 03/15/2023   10:29 AM 01/31/2023    1:24 PM  PHQ 2/9 Scores  PHQ - 2 Score 0 0 0 0 0 0 0  PHQ- 9 Score   0 0 0 0 0    Fall Risk     03/19/2024   12:41 PM 03/19/2024   10:12 AM 02/16/2024   11:36 AM 01/06/2024   11:05 AM 03/15/2023   10:28 AM  Fall Risk   Falls in the past year? 0 0 0 0 0  Number falls in past yr: 0 0 0 0 0  Injury with Fall? 0 0 0 0 0  Risk for fall due to : No Fall Risks No Fall Risks No Fall Risks No Fall Risks No Fall Risks  Follow up Falls prevention discussed;Education provided;Falls evaluation completed Falls evaluation completed Falls evaluation completed;Education provided  Falls prevention discussed    MEDICARE RISK AT HOME:  Medicare Risk at Home Any stairs in or around the home?: No If so, are there any without handrails?: No Home free of loose throw rugs in  walkways, pet beds, electrical cords, etc?: Yes Adequate lighting in your home to reduce risk of falls?: Yes Life alert?: No Use of a cane, walker or w/c?: No Grab bars in the bathroom?: Yes Shower chair or bench in shower?: No Elevated toilet seat or a handicapped toilet?: Yes  TIMED UP AND GO:  Was the test performed?  No  Cognitive Function: 6CIT completed    06/13/2018   11:37 AM 04/14/2017   10:41 AM  MMSE - Mini Mental State Exam  Not completed:  Unable to complete  Orientation to time 5 5  Orientation to Place 5 5  Registration 3 3  Attention/ Calculation 5 0  Recall  2 3  Language- name 2 objects 2 2  Language- repeat 1 1  Language- follow 3 step command 3 3  Language- read & follow direction 1 0  Write a sentence 0 0  Copy design 1 0  Total score 28 22        03/19/2024   12:41 PM 03/15/2023   10:30 AM 03/11/2022   10:44 AM 03/07/2020   11:00 AM 03/07/2020   10:48 AM  6CIT Screen  What Year? 0 points 0 points 0 points  0 points  What month? 0 points 0 points 0 points  0 points  What time? 0 points 0 points 0 points 0 points   Count back from 20 0 points 0 points 0 points 0 points   Months in reverse 0 points 0 points 2 points 2 points   Repeat phrase 0 points 0 points 2 points 4 points   Total Score 0 points 0 points 4 points      Immunizations Immunization History  Administered Date(s) Administered   Fluad Trivalent(High Dose 65+) 09/05/2023   Influenza,inj,Quad PF,6+ Mos 08/25/2017, 09/01/2018   Influenza,inj,quad, With Preservative 09/15/2017, 09/11/2018   Pneumococcal Polysaccharide-23 04/14/2017    Screening Tests Health Maintenance  Topic Date Due   COVID-19 Vaccine (1) Never done   OPHTHALMOLOGY EXAM  10/29/2022   FOOT EXAM  01/31/2024   Zoster Vaccines- Shingrix (1 of 2) 04/04/2024 (Originally 01/11/1975)   DTaP/Tdap/Td (1 - Tdap) 09/04/2024 (Originally 01/11/1975)   Pneumonia Vaccine 4+ Years old (2 of 2 - PCV) 09/04/2024 (Originally  04/14/2018)   INFLUENZA VACCINE  06/15/2024   HEMOGLOBIN A1C  07/05/2024   Diabetic kidney evaluation - Urine ACR  09/04/2024   Diabetic kidney evaluation - eGFR measurement  01/05/2025   Medicare Annual Wellness (AWV)  03/19/2025   Colonoscopy  05/17/2033   Hepatitis C Screening  Completed   HPV VACCINES  Aged Out   Meningococcal B Vaccine  Aged Out    Health Maintenance  Health Maintenance Due  Topic Date Due   COVID-19 Vaccine (1) Never done   OPHTHALMOLOGY EXAM  10/29/2022   FOOT EXAM  01/31/2024   Health Maintenance Items Addressed: Declines vaccines at this time   Additional Screening:  Vision Screening: Recommended annual ophthalmology exams for early detection of glaucoma and other disorders of the eye.  Dental Screening: Recommended annual dental exams for proper oral hygiene  Community Resource Referral / Chronic Care Management: CRR required this visit?  No   CCM required this visit?  No     Plan:     I have personally reviewed and noted the following in the patient's chart:   Medical and social history Use of alcohol, tobacco or illicit drugs  Current medications and supplements including opioid prescriptions. Patient is not currently taking opioid prescriptions. Functional ability and status Nutritional status Physical activity Advanced directives List of other physicians Hospitalizations, surgeries, and ER visits in previous 12 months Vitals Screenings to include cognitive, depression, and falls Referrals and appointments  In addition, I have reviewed and discussed with patient certain preventive protocols, quality metrics, and best practice recommendations. A written personalized care plan for preventive services as well as general preventive health recommendations were provided to patient.     Seabron Cypress [place], [place]   4/0/9811   After Visit Summary: (MyChart) Due to this being a telephonic visit, the after visit summary with patients  personalized plan was offered to patient via MyChart   Notes: Nothing significant to  report at this time.

## 2024-03-19 NOTE — Patient Instructions (Signed)
 Visit Information  Thank you for taking time to visit with me today. Please don't hesitate to contact me if I can be of assistance to you before our next scheduled appointment.  Your next care management appointment is by telephone on 04-18-2024 at 9:00 am  Telephone follow-up in 1 month  Please call the care guide team at [phone] if you need to cancel, schedule, or reschedule an appointment.   Please call the Suicide and Crisis Lifeline: 988 call the [place]  National Suicide Prevention Lifeline: 701 416 9493 or TTY: 614-552-8834 TTY 803-038-8568) to talk to a trained counselor call 1-800-273-TALK (toll free, 24 hour hotline) call the [facility]: 236-850-4594 call 911 if you are experiencing a Mental Health or Behavioral Health Crisis or need someone to talk to.  Grandville Lax, BSN RN [place]  [facility], [facility] RN Care Manager Direct Dial: [phone]  Fax: [phone]   Heart Failure Action Plan A heart failure action plan helps you know what to do when you have symptoms of heart failure. Your action plan is a color-coded plan that lists the symptoms to watch for and indicates what actions to take. If you have symptoms in the green zone, you're doing well. If you have symptoms in the yellow zone, you're having problems. If you have symptoms in the red zone, you need medical care right away. Follow the plan that was created by you and your health care provider. Review your plan each time you visit your provider. Green zone These signs mean you're doing well and can continue what you're doing: You don't have new or worsening shortness of breath. You have very little swelling or no new swelling. Your weight is stable (no gain or loss). You have a normal activity level. You don't have chest pain or any other new symptoms. Yellow zone These signs and symptoms mean your condition may be getting worse and you should make some  changes: You have trouble breathing when you're active. You have swelling in your feet or legs or have discomfort in your belly. You gain 2-3 lb (0.9-1.4 kg) in 24 hours, or 5 lb (2.3 kg) in a week. This amount may be more or less depending on your condition. You get tired easily. You have trouble sleeping. You have a dry cough. If you have any of these symptoms: Contact your provider within the next day. Your provider may adjust your medicines. Red zone These signs and symptoms mean you should get medical help right away: You have trouble breathing when resting or cannot lie flat and you need to raise your head to help you breathe. You have a dry cough that's getting worse. You have swelling or pain in your feet or legs or discomfort in your belly that's getting worse. You suddenly gain more than 2-3 lb (0.9-1.4 kg) in 24 hours, or more than 5 lb (2.3 kg) in a week. This amount may be more or less depending on your condition. You have trouble staying awake or you feel confused. You don't have an appetite. You have worsening sadness or depression. These symptoms may be an emergency. Call 911 right away. Do not wait to see if the symptoms will go away. Do not drive yourself to the hospital. Follow these instructions at home: Take medicines only as told. Eat a heart-healthy diet. Work with a dietitian to create an eating plan that's best for you. Weigh yourself each day. Your target weight is __________ lb (__________ kg). Call your provider if  you gain more than __________ lb (__________ kg) in 24 hours, or more than __________ lb (__________ kg) in a week. Health care provider name: _____________________________________________________ Health care provider phone number: _____________________________________________________ Where to find more information American Heart Association: heart.org This information is not intended to replace advice given to you by your health care provider.  Make sure you discuss any questions you have with your health care provider. Document Revised: 06/16/2023 Document Reviewed: 06/16/2023 Elsevier Patient Education  2024 [employer].

## 2024-03-19 NOTE — Patient Instructions (Signed)
 [name] , Thank you for taking time to come for your Medicare Wellness Visit. I appreciate your ongoing commitment to your health goals. Please review the following plan we discussed and let me know if I can assist you in the future.   Referrals/Orders/Follow-Ups/Clinician Recommendations: Aim for 30 minutes of exercise or brisk walking, 6-8 glasses of water, and 5 servings of fruits and vegetables each day.  This is a list of the screening recommended for you and due dates:  Health Maintenance  Topic Date Due   COVID-19 Vaccine (1) Never done   Eye exam for diabetics  10/29/2022   Complete foot exam   01/31/2024   Zoster (Shingles) Vaccine (1 of 2) 04/04/2024*   DTaP/Tdap/Td vaccine (1 - Tdap) 09/04/2024*   Pneumonia Vaccine (2 of 2 - PCV) 09/04/2024*   Flu Shot  06/15/2024   Hemoglobin A1C  07/05/2024   Yearly kidney health urinalysis for diabetes  09/04/2024   Yearly kidney function blood test for diabetes  01/05/2025   Medicare Annual Wellness Visit  03/19/2025   Colon Cancer Screening  05/17/2033   Hepatitis C Screening  Completed   HPV Vaccine  Aged Out   Meningitis B Vaccine  Aged Out  *Topic was postponed. The date shown is not the original due date.    Advanced directives: (ACP Link)Information on Advanced Care Planning can be found at [place]  Secretary of [facility] Advance Health Care Directives Advance Health Care Directives. [email or web address]   Next Medicare Annual Wellness Visit scheduled for next year: Yes  Have you seen your provider in the last 6 months (3 months if uncontrolled diabetes)? Yes

## 2024-03-19 NOTE — Patient Outreach (Signed)
 Complex Care Management   Visit Note  03/19/2024  Name:  [name] MRN: [id number] DOB: Jan 26, 1956  Situation: Referral received for Complex Care Management related to Heart Failure I obtained verbal consent from Patient.  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   Chronic kidney disease    Complication of anesthesia    Hard to wake   Degenerative joint disease (DJD) of lumbar spine    Diabetes mellitus without complication (HCC)    Type II   Essential hypertension 02/23/2017   Lumbar stenosis    NSTEMI (non-ST elevated myocardial infarction) (HCC) 06/07/2021   Sleep apnea     Assessment: Patient Reported Symptoms:  Cognitive Cognitive Status: Alert and oriented to person, place, and time Cognitive/Intellectual Conditions Management [RPT]: None reported or documented in medical history or problem list   Health Maintenance Behaviors: Annual physical exam Health Facilitated by: Rest  Neurological Neurological Review of Symptoms: No symptoms reported Neurological Management Strategies: Routine screening  HEENT HEENT Symptoms Reported: No symptoms reported HEENT Self-Management Outcome: 4 (good)    Cardiovascular Cardiovascular Symptoms Reported: No symptoms reported Does patient have uncontrolled Hypertension?: No Cardiovascular Conditions: Heart failure, High blood cholesterol, Hypertension Cardiovascular Management Strategies: Medication therapy, Routine screening Weight: 213 lb (96.6 kg) Cardiovascular Self-Management Outcome: 3 (uncertain)  Respiratory Respiratory Symptoms Reported: Shortness of breath Respiratory Conditions: Shortness of breath, Seasonal allergies Respiratory Self-Management Outcome: 3 (uncertain)  Endocrine Patient reports the following symptoms related to hypoglycemia or hyperglycemia : Weakness or fatigue Is  patient diabetic?: Yes Is patient checking blood sugars at home?: Yes Endocrine Conditions: Diabetes Endocrine Management Strategies: Routine screening, Medication therapy Endocrine Self-Management Outcome: 4 (good)  Gastrointestinal Gastrointestinal Symptoms Reported: Flatulence, Abdominal pain or discomfort, Other Other Gastrointestinal Symptoms: burping Gastrointestinal Conditions: Abdominal pain Gastrointestinal Management Strategies: Medication therapy Gastrointestinal Self-Management Outcome: 4 (good) Nutrition Risk Screen (CP): No indicators present  Genitourinary   Genitourinary Conditions: Chronic kidney disease Genitourinary Management Strategies: Medication therapy Genitourinary Self-Management Outcome: 4 (good)  Integumentary Integumentary Symptoms Reported: No symptoms reported Skin Management Strategies: Routine screening Skin Self-Management Outcome: 4 (good)  Musculoskeletal Musculoskelatal Symptoms Reviewed: No symptoms reported Musculoskeletal Management Strategies: Routine screening Musculoskeletal Self-Management Outcome: 3 (uncertain) Falls in the past year?: No Number of falls in past year: 1 or less Was there an injury with Fall?: No Fall Risk Category Calculator: 0 Patient Fall Risk Level: Low Fall Risk Patient at Risk for Falls Due to: No Fall Risks Fall risk Follow up: Falls evaluation completed  Psychosocial Psychosocial Symptoms Reported: No symptoms reported Behavioral Health Self-Management Outcome: 4 (good) Major Change/Loss/Stressor/Fears (CP): Denies Techniques to Cope with Loss/Stress/Change: Not applicable Quality of Family Relationships: helpful, involved, supportive Do you feel physically threatened by others?: No      03/19/2024   10:15 AM  Depression screen PHQ 2/9  Decreased Interest 0  Down, Depressed, Hopeless 0  PHQ - 2 Score 0    Vitals:   03/18/24 0959  BP: 105/60    Medications Reviewed Today     Reviewed by Remona Carmel, RN (Registered Nurse) on 03/19/24 at 1021  Med List Status: <None>   Medication Order Taking? Sig Documenting Provider Last Dose Status Informant  Alpha-Lipoic Acid 600 MG CAPS [id number] No Take 1 capsule (600 mg total) by mouth in the morning. For neuropathy  Patient not taking: Reported on 02/16/2024  Eliodoro Guerin, DO Not Taking Consider Medication Status and Discontinue Spouse/Significant Other  amLODipine  (NORVASC ) 10 MG tablet [id number] Yes Take 1 tablet (10 mg total) by mouth daily. Per Florrie Husky renal Eliodoro Guerin, DO Taking Active   aspirin  81 MG EC tablet [id number] Yes Take 1 tablet (81 mg total) by mouth daily. Swallow whole. Kroeger, Krista M., PA-C Taking Active Spouse/Significant Other  atorvastatin  (LIPITOR ) 80 MG tablet [id number] Yes Take 1 tablet (80 mg total) by mouth daily. Vicky Grange M, DO Taking Active   carvedilol  (COREG ) 12.5 MG tablet [id number] Yes TAKE ONE TABLET TWICE DAILY WITH MEAL(S) Eilleen Grates, MD Taking Active   cloNIDine  (CATAPRES ) 0.3 MG tablet [id number] Yes TAKE (1) TABLET TWICE A DAY for blood pressure Vicky Grange M, DO Taking Active   Continuous Blood Gluc Receiver (FREESTYLE LIBRE 2 READER) DEVI [id number] No UAD to monitor sugars continuously. E11.22  Patient not taking: Reported on 02/16/2024   Eliodoro Guerin, DO Not Taking Consider Medication Status and Discontinue Spouse/Significant Other  Continuous Glucose Receiver (FREESTYLE LIBRE 3 READER) DEVI [id number] Yes Check BGs continuously E11.22, N18.2, Z79.4 Vicky Grange M, DO Taking Active   Continuous Glucose Sensor (FREESTYLE LIBRE 2 SENSOR) [place] [id number] No USE AS DIRECTED TO CONTINUOUSLY MONITOR SUGARS  Patient not taking: Reported on 03/19/2024   Eliodoro Guerin, DO Not Taking Consider Medication Status and Discontinue   Continuous Glucose Sensor (FREESTYLE LIBRE 3 PLUS SENSOR) MISC [id number] Yes Check BG continuously E11.22, N18.2, Z79.4. Change  sensor every 15 days. Vicky Grange M, DO Taking Active   dapagliflozin  propanediol (FARXIGA ) 10 MG TABS tablet [id number] Yes Take 1 tablet (10 mg total) by mouth daily before breakfast. Vicky Grange M, DO Taking Active   furosemide  (LASIX ) 40 MG tablet [id number] Yes Take 1 tablet (40 mg total) by mouth daily. Vicky Grange M, DO Taking Active   gabapentin  (NEURONTIN ) 100 MG capsule [id number] Yes Take 1 capsule (100 mg total) by mouth 2 (two) times daily. Vicky Grange M, DO Taking Active   insulin  degludec (TRESIBA  FLEXTOUCH) 100 UNIT/ML FlexTouch Pen [id number] Yes INJECT 38 UP TO 50 UNITS DAILY AS DIRECTED. Eliodoro Guerin, DO Taking Active   Insulin  Pen Needle (PEN NEEDLES) 31G X 5 MM MISC [id number] Yes Use daily with insulin  Dx E11.9 Eliodoro Guerin, DO Taking Active Spouse/Significant Other  isosorbide  mononitrate (IMDUR ) 60 MG 24 hr tablet [id number] Yes Take 1 tablet (60 mg total) by mouth daily. Eilleen Grates, MD Taking Active   losartan (COZAAR) 25 MG tablet [id number] Yes Take 25 mg by mouth daily. Per renal [provider] Taking Active Spouse/Significant Other  nitroGLYCERIN  (NITROSTAT ) 0.4 MG SL tablet [id number] Yes Place 1 tablet (0.4 mg total) under the tongue every 5 (five) minutes as needed for chest pain. Vicky Grange M, DO Taking Active   pantoprazole  (PROTONIX ) 40 MG tablet [id number]  Take 1 tablet (40 mg total) by mouth daily. Vicky Grange M, DO  Expired 02/05/24 2359   spironolactone  (ALDACTONE ) 25 MG tablet [id number] Yes Take 25 mg by mouth daily. Take 1/2 tablet daily. Jane Meager, MD Taking Active Self            Recommendation:   Acute PCP follow-up Symptomatic with fatigue/weight gain, abdominal pain x 3 weeks.  Follow Up Plan:   Telephone follow up appointment date/time:  04-18-2024 at 9:00 am  Grandville Lax, BSN RN [facility], [facility] RN Care Manager Direct Dial:  [phone]  Fax: [phone]

## 2024-03-20 ENCOUNTER — Telehealth: Payer: Self-pay

## 2024-03-20 NOTE — Telephone Encounter (Signed)
-----   Message from Eilleen Grates sent at 03/19/2024 11:38 AM EDT ----- Regarding: RE: Acute Follow Up Needed He needs to be worked into an APP or physician schedule this week. ----- Message ----- From: Remona Carmel, RN Sent: 03/19/2024  10:33 AM EDT To: Eilleen Grates, MD; Eliodoro Guerin, DO; # Subject: Acute Follow Up Needed                         Good morning,  I spoke with patient this morning for scheduled outreach.  He reports concern related to weight gain and increasing shortness of breath and fatigue. He is denying any swelling in his lower extremities but reports frequent leg/hand cramps. His weights are as follows: 03/16/24: 207 lbs, 03/17/24: 215 lbs. 03/18/24: 212 lbs, 03/19/24: 213 lbs. 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 He states that it takes a while for the pain to subside. He denies any tenderness or exacerbation with eating or drinking.  His family is concerned because he is reluctant to call the office to be seen. Can someone schedule an acute follow up with him? Please advise.  Thanks, Grandville Lax, BSN RN [facility], [facility] RN Care Manager Direct Dial: [phone]  Fax:[phone]

## 2024-03-20 NOTE — Telephone Encounter (Signed)
 Made patient an appointment this week with DOD on 03/22/24.

## 2024-03-21 ENCOUNTER — Ambulatory Visit (INDEPENDENT_AMBULATORY_CARE_PROVIDER_SITE_OTHER)

## 2024-03-21 ENCOUNTER — Encounter: Payer: Self-pay | Admitting: Family Medicine

## 2024-03-21 ENCOUNTER — Ambulatory Visit (INDEPENDENT_AMBULATORY_CARE_PROVIDER_SITE_OTHER): Admitting: Family Medicine

## 2024-03-21 VITALS — BP 123/69 | HR 60 | Temp 98.0°F | Ht 73.0 in | Wt 218.0 lb

## 2024-03-21 DIAGNOSIS — R140 Abdominal distension (gaseous): Secondary | ICD-10-CM | POA: Diagnosis not present

## 2024-03-21 MED ORDER — POLYETHYLENE GLYCOL 3350 17 GM/SCOOP PO POWD
17.0000 g | Freq: Every day | ORAL | 0 refills | Status: AC
Start: 2024-03-21 — End: 2024-03-26

## 2024-03-21 NOTE — Progress Notes (Signed)
 BP 123/69   Pulse 60   Temp 98 F (36.7 C)   Ht 6\' 1"  (1.854 m)   Wt 218 lb (98.9 kg)   SpO2 98%   BMI 28.76 kg/m    Subjective:   Patient ID: [name], male    DOB: 1956/01/24, 68 y.o.   MRN: [id number]  HPI: [name] is a 68 y.o. male presenting on 03/21/2024 for Shortness of Breath, Fatigue, Edema (abdomen), and Weight Gain   HPI Abdominal pain and distention Patient is coming in today because he is complaining of some mild abdominal pain but more feeling like distention and swelling.  He feels like his weights come up since he has been feeling the swelling.  He denies any swelling in his lower extremities.  He says at home he has been weighing right around 212-214 which is normal but he was concerned that he was down to 207 and then jumped back up to 212 and 214 and 1 day earlier this week.  He does admit that he is having bowel movements most days but does admit that he has to strain or push to have a bowel movement.  He denies any blood in his stool.  He denies any fevers or chills.  He denies any urinary burning or dysuria or frequency.  He does have a history of CHF and CKD but it looks like that has been mostly stable.  Relevant past medical, surgical, family and social history reviewed and updated as indicated. Interim medical history since our last visit reviewed. Allergies and medications reviewed and updated.  Review of Systems  Constitutional:  Negative for chills and fever.  Eyes:  Negative for visual disturbance.  Respiratory:  Negative for shortness of breath and wheezing.   Cardiovascular:  Negative for chest pain and leg swelling.  Gastrointestinal:  Positive for abdominal distention, abdominal pain and constipation. Negative for diarrhea, nausea and vomiting.  Musculoskeletal:  Negative for back pain and gait problem.  Skin:  Negative for rash.  All other systems reviewed and are negative.   Per HPI unless specifically indicated  above   Allergies as of 03/21/2024   No Known Allergies      Medication List        Accurate as of Mar 21, 2024 11:15 AM. If you have any questions, ask your nurse or doctor.          Alpha-Lipoic Acid 600 MG Caps Take 1 capsule (600 mg total) by mouth in the morning. For neuropathy   amLODipine  10 MG tablet Commonly known as: NORVASC  Take 1 tablet (10 mg total) by mouth daily. Per Florrie Husky renal   aspirin  EC 81 MG tablet Take 1 tablet (81 mg total) by mouth daily. Swallow whole.   atorvastatin  80 MG tablet Commonly known as: LIPITOR  Take 1 tablet (80 mg total) by mouth daily.   carvedilol  12.5 MG tablet Commonly known as: COREG  TAKE ONE TABLET TWICE DAILY WITH MEAL(S)   cloNIDine  0.3 MG tablet Commonly known as: CATAPRES  TAKE (1) TABLET TWICE A DAY for blood pressure   dapagliflozin  propanediol 10 MG Tabs tablet Commonly known as: Farxiga  Take 1 tablet (10 mg total) by mouth daily before breakfast.   FreeStyle Libre 2 Reader Devi UAD to monitor sugars continuously. E11.22   FreeStyle Libre 3 Reader Staples Check BGs continuously E11.22, N18.2, Z79.4   FreeStyle Libre 2 Sensor Misc USE AS DIRECTED TO CONTINUOUSLY MONITOR SUGARS   FreeStyle Libre 3 Plus Sensor  Misc Check BG continuously E11.22, N18.2, Z79.4. Change sensor every 15 days.   furosemide  40 MG tablet Commonly known as: LASIX  Take 1 tablet (40 mg total) by mouth daily.   gabapentin  100 MG capsule Commonly known as: NEURONTIN  Take 1 capsule (100 mg total) by mouth 2 (two) times daily.   isosorbide  mononitrate 60 MG 24 hr tablet Commonly known as: IMDUR  Take 1 tablet (60 mg total) by mouth daily.   losartan 25 MG tablet Commonly known as: COZAAR Take 25 mg by mouth daily. Per renal   nitroGLYCERIN  0.4 MG SL tablet Commonly known as: Nitrostat  Place 1 tablet (0.4 mg total) under the tongue every 5 (five) minutes as needed for chest pain.   pantoprazole  40 MG tablet Commonly known as:  PROTONIX  Take 1 tablet (40 mg total) by mouth daily.   Pen Needles 31G X 5 MM Misc Use daily with insulin  Dx E11.9   polyethylene glycol powder 17 GM/SCOOP powder Commonly known as: GLYCOLAX/MIRALAX Take 17 g by mouth daily for 5 days. Started by: Lucio Sabin Eaton Folmar   spironolactone  25 MG tablet Commonly known as: ALDACTONE  Take 25 mg by mouth daily. Take 1/2 tablet daily.   Tresiba  FlexTouch 100 UNIT/ML FlexTouch Pen Generic drug: insulin  degludec INJECT 38 UP TO 50 UNITS DAILY AS DIRECTED.         Objective:   BP 123/69   Pulse 60   Temp 98 F (36.7 C)   Ht 6\' 1"  (1.854 m)   Wt 218 lb (98.9 kg)   SpO2 98%   BMI 28.76 kg/m   Wt Readings from Last 3 Encounters:  03/21/24 218 lb (98.9 kg)  03/19/24 221 lb (100.2 kg)  03/19/24 213 lb (96.6 kg)    Physical Exam Vitals and nursing note reviewed.  Constitutional:      General: He is not in acute distress.    Appearance: He is well-developed. He is not diaphoretic.  Eyes:     General: No scleral icterus.    Conjunctiva/sclera: Conjunctivae normal.  Neck:     Thyroid : No thyromegaly.  Cardiovascular:     Rate and Rhythm: Normal rate and regular rhythm.     Heart sounds: Normal heart sounds. No murmur heard. Pulmonary:     Effort: Pulmonary effort is normal. No respiratory distress.     Breath sounds: Normal breath sounds. No wheezing.  Abdominal:     General: Abdomen is flat. Bowel sounds are normal. There is distension.     Palpations: Abdomen is soft.     Tenderness: There is no abdominal tenderness. There is no right CVA tenderness, left CVA tenderness, guarding or rebound.     Hernia: No hernia is present.  Musculoskeletal:        General: Normal range of motion.     Cervical back: Neck supple.  Lymphadenopathy:     Cervical: No cervical adenopathy.  Skin:    General: Skin is warm and dry.     Findings: No rash.  Neurological:     Mental Status: He is alert and oriented to person, place, and time.      Coordination: Coordination normal.  Psychiatric:        Behavior: Behavior normal.     KUB: Bowel gas pattern shows increased stool burden.  Assessment & Plan:   Problem List Items Addressed This Visit   None Visit Diagnoses       Abdominal distention    -  Primary   Relevant Medications  polyethylene glycol powder (GLYCOLAX/MIRALAX) 17 GM/SCOOP powder   Other Relevant Orders   DG Abd 1 View   CMP14+EGFR     Likely due to constipation, will recommend that he do MiraLAX for a week.  Will also recheck his kidney function and liver function.  Does not have symptoms of CHF currently  Follow up plan: Return if symptoms worsen or fail to improve.  Counseling provided for all of the vaccine components Orders Placed This Encounter  Procedures   DG Abd 1 View   CMP14+EGFR    Jolyne Needs, MD [facility] 03/21/2024, 11:15 AM

## 2024-03-22 ENCOUNTER — Encounter: Payer: Self-pay | Admitting: Family Medicine

## 2024-03-22 ENCOUNTER — Encounter: Payer: Self-pay | Admitting: Cardiology

## 2024-03-22 ENCOUNTER — Ambulatory Visit: Attending: Cardiology | Admitting: Cardiology

## 2024-03-22 VITALS — BP 128/64 | HR 63 | Resp 16 | Ht 73.0 in | Wt 220.8 lb

## 2024-03-22 DIAGNOSIS — G4733 Obstructive sleep apnea (adult) (pediatric): Secondary | ICD-10-CM | POA: Diagnosis not present

## 2024-03-22 DIAGNOSIS — I10 Essential (primary) hypertension: Secondary | ICD-10-CM

## 2024-03-22 DIAGNOSIS — R0609 Other forms of dyspnea: Secondary | ICD-10-CM | POA: Diagnosis not present

## 2024-03-22 DIAGNOSIS — I2510 Atherosclerotic heart disease of native coronary artery without angina pectoris: Secondary | ICD-10-CM

## 2024-03-22 LAB — CMP14+EGFR
ALT: 18 IU/L (ref 0–44)
AST: 21 IU/L (ref 0–40)
Albumin: 4.1 g/dL (ref 3.9–4.9)
Alkaline Phosphatase: 86 IU/L (ref 44–121)
BUN/Creatinine Ratio: 15 (ref 10–24)
BUN: 44 mg/dL — ABNORMAL HIGH (ref 8–27)
Bilirubin Total: 0.7 mg/dL (ref 0.0–1.2)
CO2: 22 mmol/L (ref 20–29)
Calcium: 9 mg/dL (ref 8.6–10.2)
Chloride: 103 mmol/L (ref 96–106)
Creatinine, Ser: 2.99 mg/dL — ABNORMAL HIGH (ref 0.76–1.27)
Globulin, Total: 2.2 g/dL (ref 1.5–4.5)
Glucose: 70 mg/dL (ref 70–99)
Potassium: 4.4 mmol/L (ref 3.5–5.2)
Sodium: 139 mmol/L (ref 134–144)
Total Protein: 6.3 g/dL (ref 6.0–8.5)
eGFR: 22 mL/min/{1.73_m2} — ABNORMAL LOW (ref >59)

## 2024-03-22 NOTE — Progress Notes (Addendum)
 Cardiology Office Note:  .   Date:  03/22/2024  ID:  [name], DOB [birth year], MRN [id number] PCP: Eliodoro Guerin, DO  [facility] Providers Cardiologist:  Fransico Ivy, MD PCP: Eliodoro Guerin, DO  Chief Complaint  Patient presents with   Hypertension   Follow-up     [name] is a 68 y.o. male with hypertension, hyperlipidemia not at diabetes mellitus, CAD, CKD 4  Discussed the use of AI scribe software for clinical note transcription with the patient, who gave verbal consent to proceed.  History of Present Illness Over the past month, he experiences increased fatigue and shortness of breath, especially when moving between rooms. There is no chest pain. He has gained weight but does not have leg swelling. He sleeps on two pillows but often ends up without them and does not experience orthopnea. He wakes up twice nightly to urinate.  He was seen by his PCP yesterday.  KUB x-ray showed large stool burden.  He is having hard stools, a feeling of inadequate evacuation.  He has a history of sleep apnea and previously used a CPAP machine, which he has not used in years. His family observes excessive daytime sleepiness, with episodes of falling asleep during the day, even while sitting in his truck. He reports sleeping well at night but sometimes has difficulty returning to sleep after waking.        Vitals:   03/22/24 1528 03/22/24 1534  BP: (!) 144/68 128/64  Pulse: 63   Resp: 16 16  SpO2: 97% 97%      Review of Systems  Cardiovascular:  Negative for chest pain, dyspnea on exertion, leg swelling, palpitations and syncope.  Gastrointestinal:        Occasional right upper abdominal pain        Studies Reviewed: [name]       EKG 03/22/2024: Sinus bradycardia with Premature atrial complexes When compared with ECG of 19-Jul-2021 11:00, rate is slower, PAC is new    Independently interpreted 12/2023: Chol 102, TG 81, HDL 35, LDL  51 HbA1C 6.1% Hb 12.4 Cr 2.9, eGFR 23 TSH 3.3  Limited echocardiogram 07/2021:  1. Left ventricular ejection fraction, by estimation, is 55 to 60%. The  left ventricle has normal function. The left ventricle has no regional  wall motion abnormalities. There is mild left ventricular hypertrophy.  Left ventricular diastolic parameters  are indeterminate.   2. Right ventricular systolic function is normal. The right ventricular  size is normal. Tricuspid regurgitation signal is inadequate for assessing  PA pressure.   3. Left atrial size was mildly dilated.   4. The inferior vena cava is normal in size with greater than 50%  respiratory variability, suggesting right atrial pressure of 3 mmHg.   5. Limited echo with no color doppler.   Coronary angiogram 07/2021:   Ost LAD to Prox LAD lesion is 20% stenosed.   Mid LAD-1 lesion is 50% stenosed.   Dist LAD lesion is 85% stenosed.   Dist Cx lesion is 90% stenosed.   Prox RCA lesion is 30% stenosed.   2nd Diag lesion is 90% stenosed.   2nd RPL lesion is 90% stenosed.   Prox LAD to Mid LAD lesion is 60% stenosed.   Mid LAD-2 lesion is 60% stenosed.   Non-stenotic Ost Cx to Prox Cx lesion was previously treated.   Non-stenotic 1st Mrg lesion was previously treated.     Physical Exam Vitals and nursing note reviewed.  Constitutional:      General: He is not in acute distress. Neck:     Vascular: No JVD.  Cardiovascular:     Rate and Rhythm: Normal rate and regular rhythm.     Heart sounds: Normal heart sounds. No murmur heard. Pulmonary:     Effort: Pulmonary effort is normal.     Breath sounds: Normal breath sounds. No wheezing or rales.  Abdominal:     General: There is distension.     Tenderness: There is no abdominal tenderness.  Musculoskeletal:     Right lower leg: No edema.     Left lower leg: No edema.      VISIT DIAGNOSES:   ICD-10-CM   1. Essential hypertension  I10 EKG 12-Lead    2. Exertional dyspnea   R06.09 ECHOCARDIOGRAM COMPLETE    3. OSA (obstructive sleep apnea)  G47.33 Ambulatory referral to Sleep Studies    4. Coronary artery disease involving native coronary artery of native heart without angina pectoris  I25.10        [name] is a 68 y.o. male with hypertension, hyperlipidemia not at diabetes mellitus, CAD, CKD 4 Assessment & Plan  Exertional dyspnea: Physical exam not consistent with congestive heart failure.  Function remains stable with GFR in the low 20s.  KUB x-ray shows stool burden with increased abdominal gas.  I wonder if this is causing some of his exertional dyspnea with potentially compression of his diaphragm.  I will check echocardiogram since it has not been checked in a long time.  However, my suspicion for cardiac etiology for his dyspnea symptoms remains fairly low.  With his advanced CKD, I do not recommend ischemia workup, unless symptoms are not clearly suggestive of angina.   CAD: No clear anginal symptoms. Continue aspirin  81 mg daily, Lipitor  80 mg daily, along with amlodipine , losartan, Imdur . Lipids very well-controlled  Chronic kidney disease, stage 4: Renal function stable.  Continue follow-up with nephrology.   Obstructive sleep apnea: Non-compliance with CPAP causing daytime sleepiness and fatigue.  Sleep study needed for CPAP settings. - Refer to sleep study to determine CPAP pressure requirements.  Constipation Increased stool burden causing diaphragmatic pressure and dyspnea. Hard stools and incomplete evacuation reported. - Recommend MiraLAX as prescribed by PCP.    Hypertension: Well-controlled.  Type 2 diabetes mellitus: -As per PCP.      F/u in 3 months w/Dr. Lavonne Prairie  Signed, Cody Das, MD

## 2024-03-22 NOTE — Patient Instructions (Signed)
 Testing/Procedures: ECHO  Your physician has requested that you have an echocardiogram. Echocardiography is a painless test that uses sound waves to create images of your heart. It provides your doctor with information about the size and shape of your heart and how well your heart's chambers and valves are working. This procedure takes approximately one hour. There are no restrictions for this procedure. Please do NOT wear cologne, perfume, aftershave, or lotions (deodorant is allowed). Please arrive 15 minutes prior to your appointment time.  Please note: We ask at that you not bring children with you during ultrasound (echo/ vascular) testing. Due to room size and safety concerns, children are not allowed in the ultrasound rooms during exams. Our front office staff cannot provide observation of children in our lobby area while testing is being conducted. An adult accompanying a patient to their appointment will only be allowed in the ultrasound room at the discretion of the ultrasound technician under special circumstances. We apologize for any inconvenience.   REFERRAL TO SLEEP STUDY   Follow-Up: At [facility], you and your health needs are our priority.  As part of our continuing mission to provide you with exceptional heart care, our providers are all part of one team.  This team includes your primary Cardiologist (physician) and Advanced Practice Providers or APPs (Physician Assistants and Nurse Practitioners) who all work together to provide you with the care you need, when you need it.  Your next appointment:   3 month(s)  Provider:   Eilleen Grates, MD

## 2024-03-23 ENCOUNTER — Telehealth: Payer: Self-pay | Admitting: *Deleted

## 2024-03-23 NOTE — Telephone Encounter (Signed)
-----   Message from Nurse Aneta Bar sent at 03/20/2024 10:22 AM EDT ----- Regarding: FW: Acute Follow Up Needed  ----- Message ----- From: Alvis Jourdain Sent: 03/20/2024  10:08 AM EDT To: Peggi Bowels, LPN Subject: RE: Acute Follow Up Needed                     Hey, I don't see anything available this week right now but I'll keep an eye out for any cancellations! ----- Message ----- From: Peggi Bowels, LPN Sent: 11/20/1094   8:22 AM EDT To: Alvis Jourdain; [facility] Scheduling Subject: FW: Acute Follow Up Needed                     Leah, can you help with this please?  Thanks, Triage ----- Message ----- From: Eilleen Grates, MD Sent: 03/19/2024  11:38 AM EDT To: Catarino Clines [facility] Triage Subject: RE: Acute Follow Up Needed                     He needs to be worked into an APP or physician schedule this week. ----- Message ----- From: Remona Carmel, RN Sent: 03/19/2024  10:33 AM EDT To: Eilleen Grates, MD; Eliodoro Guerin, DO; # Subject: Acute Follow Up Needed                         Good morning,  I spoke with patient this morning for scheduled outreach.  He reports concern related to weight gain and increasing shortness of breath and fatigue. He is denying any swelling in his lower extremities but reports frequent leg/hand cramps. His weights are as follows: 03/16/24: 207 lbs, 03/17/24: 215 lbs. 03/18/24: 212 lbs, 03/19/24: 213 lbs. 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 He states that it takes a while for the pain to subside. He denies any tenderness or exacerbation with eating or drinking.  His family is concerned because he is reluctant to call the office to be seen. Can someone schedule an acute follow up with him? Please advise.  Thanks, Grandville Lax, BSN RN [facility], [facility] RN Care Manager Direct Dial: [phone]  Fax:[phone]

## 2024-03-23 NOTE — Telephone Encounter (Signed)
 Pt saw DOD Dr. Filiberto Hug yesterday in the clinic.   Pt scheduled for an echo on 6/16 and will see Dr. Lavonne Prairie on 06/20/24.

## 2024-03-29 ENCOUNTER — Ambulatory Visit: Payer: Self-pay | Admitting: Family Medicine

## 2024-04-18 ENCOUNTER — Other Ambulatory Visit: Payer: Self-pay | Admitting: *Deleted

## 2024-04-18 NOTE — Patient Outreach (Signed)
 Complex Care Management   Visit Note  04/18/2024  Name:  [name] MRN: [id number] DOB: 1956/09/26  Situation: Referral received for Complex Care Management related to Heart Failure and Chronic Kidney Disease I obtained verbal consent from Patient.  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   Chronic kidney disease    Complication of anesthesia    Hard to wake   Degenerative joint disease (DJD) of lumbar spine    Diabetes mellitus without complication (HCC)    Type II   Essential hypertension 02/23/2017   Lumbar stenosis    NSTEMI (non-ST elevated myocardial infarction) (HCC) 06/07/2021   Sleep apnea     Assessment: Patient Reported Symptoms:  Cognitive Cognitive Status: Alert and oriented to person, place, and time Cognitive/Intellectual Conditions Management [RPT]: None reported or documented in medical history or problem list   Health Maintenance Behaviors: Annual physical exam Health Facilitated by: Rest  Neurological Neurological Review of Symptoms: No symptoms reported    HEENT HEENT Symptoms Reported: Tearing      Cardiovascular Cardiovascular Symptoms Reported: No symptoms reported Does patient have uncontrolled Hypertension?: No Cardiovascular Conditions: Heart failure, High blood cholesterol, Hypertension Cardiovascular Management Strategies: Medication therapy, Routine screening Weight: 217 lb (98.4 kg) (patient reported)  Respiratory Respiratory Symptoms Reported: No symptoms reported Respiratory Conditions: Shortness of breath  Endocrine Patient reports the following symptoms related to hypoglycemia or hyperglycemia : No symptoms reported Is patient diabetic?: Yes Is patient checking blood sugars at home?: No Endocrine Conditions: Diabetes  Gastrointestinal Gastrointestinal Symptoms Reported: Constipation Gastrointestinal  Conditions: Constipation, Distension Gastrointestinal Management Strategies: Medication therapy Gastrointestinal Comment: reports intermittent use of miralax  but states 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 Nutrition Risk Screen (CP): No indicators present  Genitourinary Genitourinary Symptoms Reported: No symptoms reported Genitourinary Conditions: Chronic kidney disease Genitourinary Management Strategies: Medication therapy  Integumentary Integumentary Symptoms Reported: No symptoms reported    Musculoskeletal Musculoskelatal Symptoms Reviewed: No symptoms reported   Falls in the past year?: No Number of falls in past year: 1 or less Was there an injury with Fall?: No Fall Risk Category Calculator: 0 Patient Fall Risk Level: Low Fall Risk Patient at Risk for Falls Due to: No Fall Risks Fall risk Follow up: Falls evaluation completed  Psychosocial Psychosocial Symptoms Reported: No symptoms reported     Quality of Family Relationships: helpful, involved, supportive Do you feel physically threatened by others?: No      03/21/2024   10:39 AM  Depression screen PHQ 2/9  Decreased Interest 0  Down, Depressed, Hopeless 0  PHQ - 2 Score 0    There were no vitals filed for this visit.  Medications Reviewed Today     Reviewed by Remona Carmel, RN (Registered Nurse) on 04/18/24 at [phone]  Med List Status: <None>   Medication Order Taking? Sig Documenting Provider Last Dose Status Informant  Alpha-Lipoic Acid 600 MG CAPS [id number] No Take 1 capsule (600 mg total) by mouth in the morning. For neuropathy Eliodoro Guerin, DO Taking Active Spouse/Significant Other  amLODipine  (NORVASC ) 10 MG tablet [id number] No Take 1 tablet (10 mg total) by mouth daily. Per Florrie Husky renal Eliodoro Guerin, DO Taking Active   aspirin  81 MG EC tablet [id number] No Take 1 tablet (81 mg total) by mouth daily. Swallow  whole.  Kroeger, Krista M., PA-C Taking Active Spouse/Significant Other  atorvastatin  (LIPITOR ) 80 MG tablet [id number] No Take 1 tablet (80 mg total) by mouth daily. Vicky Grange M, DO Taking Active   carvedilol  (COREG ) 12.5 MG tablet [id number] No TAKE ONE TABLET TWICE DAILY WITH MEAL(S) Eilleen Grates, MD Taking Active   cloNIDine  (CATAPRES ) 0.3 MG tablet [id number] No TAKE (1) TABLET TWICE A DAY for blood pressure Eliodoro Guerin, DO Taking Active   Continuous Glucose Receiver (FREESTYLE LIBRE 3 READER) DEVI [id number] No Check BGs continuously E11.22, N18.2, Z79.4 Gottschalk, Ashly M, DO Taking Active   Continuous Glucose Sensor (FREESTYLE LIBRE 3 PLUS SENSOR) MISC [id number] No Check BG continuously E11.22, N18.2, Z79.4. Change sensor every 15 days. Vicky Grange M, DO Taking Active   dapagliflozin  propanediol (FARXIGA ) 10 MG TABS tablet [id number] No Take 1 tablet (10 mg total) by mouth daily before breakfast. Vicky Grange M, DO Taking Active   furosemide  (LASIX ) 40 MG tablet [id number] No Take 1 tablet (40 mg total) by mouth daily. Vicky Grange M, DO Taking Active   gabapentin  (NEURONTIN ) 100 MG capsule [id number] No Take 1 capsule (100 mg total) by mouth 2 (two) times daily. Vicky Grange M, DO Taking Active   insulin  degludec (TRESIBA  FLEXTOUCH) 100 UNIT/ML FlexTouch Pen [id number] No INJECT 38 UP TO 50 UNITS DAILY AS DIRECTED. Eliodoro Guerin, DO Taking Active   Insulin  Pen Needle (PEN NEEDLES) 31G X 5 MM MISC [id number] No Use daily with insulin  Dx E11.9 Eliodoro Guerin, DO Taking Active Spouse/Significant Other  isosorbide  mononitrate (IMDUR ) 60 MG 24 hr tablet [id number] No Take 1 tablet (60 mg total) by mouth daily. Eilleen Grates, MD Taking Active   losartan (COZAAR) 25 MG tablet [id number] No Take 25 mg by mouth daily. Per renal [provider] Taking Active Spouse/Significant Other  nitroGLYCERIN  (NITROSTAT ) 0.4 MG SL tablet [id number] No Place 1 tablet (0.4 mg  total) under the tongue every 5 (five) minutes as needed for chest pain. Vicky Grange M, DO Taking Active   pantoprazole  (PROTONIX ) 40 MG tablet [id number]  Take 1 tablet (40 mg total) by mouth daily. Vicky Grange M, DO  Expired 02/05/24 2359   spironolactone  (ALDACTONE ) 25 MG tablet [id number] No Take 25 mg by mouth daily. Take 1/2 tablet daily. Jane Meager, MD Taking Active Self            Recommendation:   Continue Current Plan of Care  Follow Up Plan:   Telephone follow up appointment date/time:  05-16-2024 at 9:30 am  Grandville Lax, BSN RN [facility], [facility] RN Care Manager Direct Dial: [phone]  Fax: [phone]

## 2024-04-18 NOTE — Patient Instructions (Signed)
 Visit Information  Thank you for taking time to visit with me today. Please don't hesitate to contact me if I can be of assistance to you before our next scheduled appointment.  Your next care management appointment is by telephone on 05-16-2024 at 9:30 am  Telephone follow-up in 1 month  Please call the care guide team at [phone] if you need to cancel, schedule, or reschedule an appointment.   Please call the Suicide and Crisis Lifeline: 988 call the [place]  National Suicide Prevention Lifeline: 8255225532 or TTY: (225) 536-1618 TTY [phone]) to talk to a trained counselor call 1-800-273-TALK (toll free, 24 hour hotline) call the [facility]: [phone] call 911 if you are experiencing a Mental Health or Behavioral Health Crisis or need someone to talk to.  Grandville Lax, BSN RN [facility], [facility] RN Care Manager Direct Dial: [phone]  Fax: [phone]  Constipation, Adult Constipation is when a person has trouble pooping (having a bowel movement). When you have this condition, you may poop fewer than 3 times a week. Your poop (stool) may also be dry, hard, or bigger than normal. Follow these instructions at home: Eating and drinking  Eat foods that have a lot of fiber, such as: Fresh fruits and vegetables. Whole grains. Beans. Eat less of foods that are low in fiber and high in fat and sugar, such as: [place] fries. Hamburgers. Cookies. Candy. Soda. Drink enough fluid to keep your pee (urine) pale yellow. General instructions Exercise regularly or as told by your doctor. Try to do 150 minutes of exercise each week. Go to the restroom when you feel like you need to poop. Do not hold it in. Take over-the-counter and prescription medicines only as told by your doctor. These include any fiber supplements. When you poop: Do deep breathing while relaxing your lower belly (abdomen). Relax your pelvic  floor. The pelvic floor is a group of muscles that support the rectum, bladder, and intestines (as well as the uterus in women). Watch your condition for any changes. Tell your doctor if you notice any. Keep all follow-up visits as told by your doctor. This is important. Contact a doctor if: You have pain that gets worse. You have a fever. You have not pooped for 4 days. You vomit. You are not hungry. You lose weight. You are bleeding from the opening of the butt (anus). You have thin, pencil-like poop. Get help right away if: You have a fever, and your symptoms suddenly get worse. You leak poop or have blood in your poop. Your belly feels hard or bigger than normal (bloated). You have very bad belly pain. You feel dizzy or you faint. Summary Constipation is when a person poops fewer than 3 times a week, has trouble pooping, or has poop that is dry, hard, or bigger than normal. Eat foods that have a lot of fiber. Drink enough fluid to keep your pee (urine) pale yellow. Take over-the-counter and prescription medicines only as told by your doctor. These include any fiber supplements. This information is not intended to replace advice given to you by your health care provider. Make sure you discuss any questions you have with your health care provider. Document Revised: 09/15/2022 Document Reviewed: 09/15/2022 Elsevier Patient Education  2024 [employer].

## 2024-04-30 ENCOUNTER — Ambulatory Visit (HOSPITAL_COMMUNITY)
Admission: RE | Admit: 2024-04-30 | Discharge: 2024-04-30 | Disposition: A | Discharge status: Home / Self Care | Source: Ambulatory Visit | Attending: Cardiology | Admitting: Cardiology

## 2024-04-30 ENCOUNTER — Ambulatory Visit: Payer: Self-pay | Admitting: Cardiology

## 2024-04-30 DIAGNOSIS — R0609 Other forms of dyspnea: Secondary | ICD-10-CM | POA: Diagnosis not present

## 2024-04-30 LAB — ECHOCARDIOGRAM COMPLETE
Area-P 1/2: 1.6 cm2
S' Lateral: 2.6 cm

## 2024-05-01 ENCOUNTER — Ambulatory Visit (INDEPENDENT_AMBULATORY_CARE_PROVIDER_SITE_OTHER): Admitting: Neurology

## 2024-05-01 ENCOUNTER — Encounter: Payer: Self-pay | Admitting: Neurology

## 2024-05-01 VITALS — BP 139/62 | HR 60 | Ht 72.0 in | Wt 220.0 lb

## 2024-05-01 DIAGNOSIS — I2510 Atherosclerotic heart disease of native coronary artery without angina pectoris: Secondary | ICD-10-CM | POA: Diagnosis not present

## 2024-05-01 DIAGNOSIS — R0989 Other specified symptoms and signs involving the circulatory and respiratory systems: Secondary | ICD-10-CM | POA: Diagnosis not present

## 2024-05-01 DIAGNOSIS — K5900 Constipation, unspecified: Secondary | ICD-10-CM

## 2024-05-01 DIAGNOSIS — G4719 Other hypersomnia: Secondary | ICD-10-CM

## 2024-05-01 DIAGNOSIS — R351 Nocturia: Secondary | ICD-10-CM

## 2024-05-01 DIAGNOSIS — E663 Overweight: Secondary | ICD-10-CM

## 2024-05-01 DIAGNOSIS — Z789 Other specified health status: Secondary | ICD-10-CM

## 2024-05-01 DIAGNOSIS — G4733 Obstructive sleep apnea (adult) (pediatric): Secondary | ICD-10-CM | POA: Diagnosis not present

## 2024-05-01 NOTE — Progress Notes (Signed)
 Subjective:    Patient ID: [name] is a 68 y.o. male.  HPI    Debbra Fairy, MD, PhD [facility] [address], [address] [address] [place], [place] [address]  Dear Dr. Filiberto Hug,  I saw your patient, [name], upon your kind request in my sleep clinic today for initial consultation of his sleep disorder, in particular, evaluation of his prior diagnosis of obstructive sleep apnea.  The patient is accompanied by his wife today.  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  He reports that he stopped using his PAP machine over 10 years ago due to many reasons including stress.  Prior sleep study results were reviewed from 2008.  He had a baseline sleep study in [place]  on 05/29/2007 and study was interpreted by Vicky Grange, MD.  His total AHI was 17/h, average oxygen saturation 96%, nadir was 89%.  He has not used his PAP machine in years.  He has been noted to have snoring and daytime somnolence.  Epworth sleepiness score is 6 out of 24, fatigue severity score is 47 out of 63.  He generally goes to bed around 11 and rise time is around 8.  He has nocturia about 2-3 times per average night.  He also reports significant constipation, has not addressed this with his PCP, feels like he has not had a normal or full bowel movement in weeks.  He lives with his wife, they have 3 grown children, 2 of them have sleep apnea.  He would be willing to retest and consider treatment again.  He denies recurrent morning headaches.  He drinks caffeine in the form of coffee, about 2 cups/day and occasional soda.  He is a non-smoker.  He  drinks alcohol daily in the form of beer, 3 to 6/day on average.  I reviewed your office note from 03/22/2024.  A CPAP compliance download or AutoPap compliance download was not available for review today.  His Past Medical History Is Significant For: 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   Chronic kidney disease    Complication of anesthesia    Hard to wake   Degenerative joint disease (DJD) of lumbar spine    Diabetes mellitus without complication (HCC)    Type II   Essential hypertension 02/23/2017   Lumbar stenosis    NSTEMI (non-ST elevated myocardial infarction) (HCC) 06/07/2021   Sleep apnea     His Past Surgical History Is Significant For: Past Surgical History:  Procedure Laterality Date   AMPUTATION TOE Right 05/17/2020   Procedure: AMPUTATION TOE partial right great toe;  Surgeon: Camilo Cella, DPM;  Location: [facility] ORS;  Service: Podiatry;  Laterality: Right;   BELPHAROPTOSIS REPAIR     CARDIAC CATHETERIZATION     CORONARY STENT INTERVENTION N/A 06/11/2021   Procedure: CORONARY STENT INTERVENTION;  Surgeon: Swaziland, Peter M, MD;  Location: [facility] INVASIVE CV LAB;  Service: Cardiovascular;  Laterality: N/A;   CORONARY ULTRASOUND/IVUS N/A 06/11/2021   Procedure: Intravascular Ultrasound/IVUS;  Surgeon: Swaziland, Peter M, MD;  Location: [facility] INVASIVE CV LAB;  Service: Cardiovascular;  Laterality: N/A;   EYE SURGERY  FLEXIBLE SIGMOIDOSCOPY N/A 05/18/2023   Procedure: FLEXIBLE SIGMOIDOSCOPY;  Surgeon: Suzette Espy, MD;  Location: [facility] ENDO SUITE;  Service: Endoscopy;  Laterality: N/A;  2:00 PM, ASA 3   HEMORRHOID SURGERY     LEFT HEART CATH AND CORONARY ANGIOGRAPHY N/A 06/09/2021   Procedure: LEFT HEART CATH AND CORONARY ANGIOGRAPHY;  Surgeon: Swaziland, Peter M, MD;  Location: [facility] INVASIVE CV LAB;  Service: Cardiovascular;  Laterality: N/A;   LEFT HEART CATH AND CORONARY ANGIOGRAPHY N/A 07/23/2021   Procedure: LEFT HEART  CATH AND CORONARY ANGIOGRAPHY;  Surgeon: Avanell Leigh, MD;  Location: [facility] INVASIVE CV LAB;  Service: Cardiovascular;  Laterality: N/A;   skin cancer removed right ear     TRANSFORAMINAL LUMBAR INTERBODY FUSION (TLIF) WITH PEDICLE SCREW FIXATION 2 LEVEL N/A 07/06/2018   Procedure: TRANSFORAMINAL LUMBAR INTERBODY FUSION (TLIF) L4-S1;  Surgeon: Mort Ards, MD;  Location: [facility] OR;  Service: Orthopedics;  Laterality: N/A;    His Family History Is Significant For: Family History  Problem Relation Age of Onset   Heart attack Father 70       Died suddenly   Hypertension Father    Vision loss Sister        one eye   Cancer Brother        skin   Diabetes Brother    Sleep apnea Daughter    Sleep apnea Son     His Social History Is Significant For: Social History   Socioeconomic History   Marital status: Married    Spouse name: Devra Fontana   Number of children: 3   Years of education: Not on file   Highest education level: Not on file  Occupational History   Occupation: 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 Has another son and one daughter    Raise cattle    Social Drivers of Health   Financial Resource Strain: Low Risk  (03/19/2024)   Overall Financial Resource Strain (CARDIA)    Difficulty of Paying Living Expenses: Not hard at all  Food Insecurity: No Food Insecurity (03/19/2024)   Hunger Vital Sign    Worried About Running Out of Food in the Last Year: Never true    Ran Out of Food in the Last Year: Never true  Transportation Needs: No Transportation  Needs (03/19/2024)   PRAPARE - Administrator, Civil Service (Medical): No    Lack of Transportation (Non-Medical): No  Physical Activity: Sufficiently Active (03/19/2024)   Exercise Vital Sign    Days of Exercise per Week: 5 days    Minutes of Exercise per Session: 30 min  Stress: No Stress Concern Present (03/19/2024)   [employer] of Occupational Health - Occupational Stress Questionnaire    Feeling of Stress : Not at all  Social Connections: Moderately Isolated (03/19/2024)   Social Connection and Isolation Panel    Frequency of Communication with Friends and Family: More than three times a week  Frequency of Social Gatherings with Friends and Family: Three times a week    Attends Religious Services: Never    Active Member of Clubs or Organizations: No    Attends Banker Meetings: Never    Marital Status: Married    His Allergies Are:  No Known Allergies:   His Current Medications Are:  Outpatient Encounter Medications as of 05/01/2024  Medication Sig   amLODipine  (NORVASC ) 10 MG tablet Take 1 tablet (10 mg total) by mouth daily. Per Florrie Husky renal   aspirin  81 MG EC tablet Take 1 tablet (81 mg total) by mouth daily. Swallow whole.   atorvastatin  (LIPITOR ) 80 MG tablet Take 1 tablet (80 mg total) by mouth daily.   carvedilol  (COREG ) 12.5 MG tablet TAKE ONE TABLET TWICE DAILY WITH MEAL(S)   cloNIDine  (CATAPRES ) 0.3 MG tablet TAKE (1) TABLET TWICE A DAY for blood pressure   Continuous Glucose Receiver (FREESTYLE LIBRE 3 READER) DEVI Check BGs continuously E11.22, N18.2, Z79.4   Continuous Glucose Sensor (FREESTYLE LIBRE 3 PLUS SENSOR) MISC Check BG continuously E11.22, N18.2, Z79.4. Change sensor every 15 days.   dapagliflozin  propanediol (FARXIGA ) 10 MG TABS tablet Take 1 tablet (10 mg total) by mouth daily before breakfast.   furosemide  (LASIX ) 40 MG tablet Take 1 tablet (40 mg total) by mouth daily.   gabapentin  (NEURONTIN ) 100 MG capsule Take 1  capsule (100 mg total) by mouth 2 (two) times daily.   insulin  degludec (TRESIBA  FLEXTOUCH) 100 UNIT/ML FlexTouch Pen INJECT 38 UP TO 50 UNITS DAILY AS DIRECTED.   Insulin  Pen Needle (PEN NEEDLES) 31G X 5 MM MISC Use daily with insulin  Dx E11.9   isosorbide  mononitrate (IMDUR ) 60 MG 24 hr tablet Take 1 tablet (60 mg total) by mouth daily.   losartan (COZAAR) 25 MG tablet Take 25 mg by mouth daily. Per renal   nitroGLYCERIN  (NITROSTAT ) 0.4 MG SL tablet Place 1 tablet (0.4 mg total) under the tongue every 5 (five) minutes as needed for chest pain.   spironolactone  (ALDACTONE ) 25 MG tablet Take 25 mg by mouth daily. Take 1/2 tablet daily.   Alpha-Lipoic Acid 600 MG CAPS Take 1 capsule (600 mg total) by mouth in the morning. For neuropathy (Patient not taking: Reported on 05/01/2024)   pantoprazole  (PROTONIX ) 40 MG tablet Take 1 tablet (40 mg total) by mouth daily.   No facility-administered encounter medications on file as of 05/01/2024.  :   Review of Systems:  Out of a complete 14 point review of systems, all are reviewed and negative with the exception of these symptoms as listed below:  Review of Systems  Neurological:        Pt here for sleep consult  Pt snores,controlled hypertension,fatigue Pt denies headaches Pt had sleep study 2008 and hasn't used cpap in 10 years    ESS:6 FSS:46    Objective:  Neurological Exam  Physical Exam Physical Examination:   Vitals:   05/01/24 1325  BP: 139/62  Pulse: 60    General Examination: The patient is a very pleasant 68 y.o. male in no acute distress. He appears well-developed and well-nourished and well groomed.   HEENT: Normocephalic, atraumatic, pupils are equal, round and reactive to light, extraocular tracking is good without limitation to gaze excursion or nystagmus noted. Hearing is grossly intact. Face is symmetric with normal facial animation. Speech is clear with no dysarthria noted. There is no hypophonia. There is no lip,  neck/head, jaw or voice tremor. Neck is supple with  full range of passive and active motion. There are no carotid bruits on auscultation. Oropharynx exam reveals: mild mouth dryness, adequate dental hygiene and moderate airway crowding, due to small airway entry, Mallampati class III, thicker soft palate and wider uvula, tongue protrudes centrally and palate elevates symmetrically, tonsils on the smaller side, neck circumference 17-1/2 inches, minimal overbite noted, he has caps on the front teeth on the top.  Chest: Clear to auscultation without wheezing, but he has left basilar crackles.   Heart: S1+S2+0, regular and normal without murmurs, rubs or gallops noted.   Abdomen: distended with some bowel sounds noted.   Extremities: There is trace pitting edema in the distal lower extremities bilaterally.   Skin: Warm and dry without trophic changes noted.   Musculoskeletal: exam reveals no obvious joint deformities.   Neurologically:  Mental status: The patient is awake, alert and oriented in all 4 spheres. His immediate and remote memory, attention, language skills and fund of knowledge are appropriate. There is no evidence of aphasia, agnosia, apraxia or anomia. Speech is clear with normal prosody and enunciation. Thought process is linear. Mood is normal and affect is normal.  Cranial nerves II - XII are as described above under HEENT exam.  Motor exam: Normal bulk, strength and tone is noted. There is no obvious action or resting tremor.  Fine motor skills and coordination: grossly intact.  Cerebellar testing: No dysmetria or intention tremor. There is no truncal or gait ataxia.  Sensory exam: intact to light touch in the upper and lower extremities.  Gait, station and balance: He stands easily. No veering to one side is noted. No leaning to one side is noted. Posture is age-appropriate and stance is narrow based. Gait shows normal stride length and normal pace. No problems turning are noted.    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  While a laboratory attended sleep study is typically considered gold standard for evaluation of sleep disordered breathing, we mutually agreed to proceed with a home sleep test at this time for reevaluation purposes.   I had a long chat with the patient and his wife about my findings and the diagnosis of sleep apnea, particularly OSA, its prognosis and treatment options. We talked about medical/conservative treatments, surgical interventions and non-pharmacological approaches for symptom control. I explained, in particular, the risks and ramifications of untreated moderate to severe OSA, especially with respect to developing cardiovascular disease down the road, including congestive heart failure (CHF), difficult to treat hypertension, cardiac arrhythmias (particularly A-fib), neurovascular complications including TIA, stroke and dementia. Even type 2 diabetes has, in part, been linked to untreated OSA. Symptoms of untreated OSA may include (but may not be limited to) daytime sleepiness, nocturia (i.e. frequent nighttime urination), memory problems, mood irritability and suboptimally controlled or worsening mood disorder such as depression and/or anxiety, lack of energy, lack of motivation, physical discomfort, as well as recurrent headaches, especially morning or nocturnal headaches. We talked about the importance of maintaining a healthy lifestyle and striving for healthy weight. In addition, we talked about the importance of striving  for and maintaining good sleep hygiene.  He is advised to avoid drinking alcohol every day and scale back on his beer consumption.  He is furthermore advised to make an appointment with his primary care for addressing his constipation, his abdomen is mildly distended. I recommended a sleep study at this time. I outlined the differences between a laboratory attended sleep study which is considered more comprehensive and accurate over the option of a home sleep test (HST); the latter may lead to underestimation of sleep disordered breathing in some instances and does not help with diagnosing upper airway resistance syndrome and is not accurate enough to diagnose primary central sleep apnea typically. I outlined possible surgical and non-surgical treatment options of OSA, including the use of a positive airway pressure (PAP) device (i.e. CPAP, AutoPAP/APAP or BiPAP in certain circumstances), a custom-made dental device (aka oral appliance, which would require a referral to a specialist dentist or orthodontist typically, and is generally speaking not considered for patients with full dentures or edentulous state), upper airway surgical options, such as traditional UPPP (which is not considered a first-line treatment) or the Inspire device (hypoglossal nerve stimulator, which would involve a referral for consultation with an ENT surgeon, after careful selection, following inclusion criteria - also not first-line treatment). I explained the PAP treatment option to the patient in detail, as this is generally considered first-line treatment.  The patient indicated that he would be willing to try PAP therapy, if the need arises. I explained the importance of being compliant with PAP treatment, not only for insurance purposes but primarily to improve patient's symptoms symptoms, and for the patient's long term health benefit, including to reduce His cardiovascular risks longer-term.    We will pick up our discussion about  the next steps and treatment options after testing.  We will keep them posted as to the test results by phone call and/or MyChart messaging where possible.  We will plan to follow-up in sleep clinic accordingly as well.  I answered all their questions today and the patient and his wife were in agreement.   I encouraged them to call with any interim questions, concerns, problems or updates or email us  through MyChart.  Generally speaking, sleep test authorizations may take up to 2 weeks, sometimes less, sometimes longer, the patient is encouraged to get in touch with us  if they do not hear back from the sleep lab staff directly within the next 2 weeks.  Thank you very much for allowing me to participate in the care of this nice patient. If I can be of any further assistance to you please do not hesitate to call me at [phone].  Sincerely,   Debbra Fairy, MD, PhD

## 2024-05-01 NOTE — Patient Instructions (Signed)

## 2024-05-16 ENCOUNTER — Other Ambulatory Visit: Payer: Self-pay | Admitting: *Deleted

## 2024-05-16 ENCOUNTER — Encounter: Payer: Self-pay | Admitting: *Deleted

## 2024-05-16 NOTE — Patient Outreach (Signed)
 Complex Care Management   Visit Note  05/16/2024  Name:  [name] MRN: [id number] DOB: 1956-06-01  Situation: Referral received for Complex Care Management related to Heart Failure and Chronic Kidney Disease I obtained verbal consent from Patient.  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   Chronic kidney disease    Complication of anesthesia    Hard to wake   Degenerative joint disease (DJD) of lumbar spine    Diabetes mellitus without complication (HCC)    Type II   Essential hypertension 02/23/2017   Lumbar stenosis    NSTEMI (non-ST elevated myocardial infarction) (HCC) 06/07/2021   Sleep apnea     Assessment: Patient Reported Symptoms:  Cognitive Cognitive Status: No symptoms reported Cognitive/Intellectual Conditions Management [RPT]: None reported or documented in medical history or problem list   Health Maintenance Behaviors: Annual physical exam Healing Pattern: Average Health Facilitated by: Rest  Neurological Neurological Review of Symptoms: No symptoms reported Neurological Management Strategies: Medication therapy Neurological Self-Management Outcome: 4 (good)  HEENT HEENT Symptoms Reported: No symptoms reported HEENT Management Strategies: Routine screening HEENT Self-Management Outcome: 4 (good)    Cardiovascular Cardiovascular Symptoms Reported: No symptoms reported Does patient have uncontrolled Hypertension?: No Cardiovascular Management Strategies: Medication therapy, Routine screening Weight: 214 lb (97.1 kg) (patient reported) Cardiovascular Self-Management Outcome: 4 (good)  Respiratory Respiratory Symptoms Reported: Wheezing, Productive cough Additional Respiratory Details: Reports productive cough in the morning, wheezing noted at night Respiratory Management Strategies: Coping strategies  Endocrine Endocrine  Symptoms Reported: No symptoms reported Is patient diabetic?: Yes Is patient checking blood sugars at home?: No Endocrine Self-Management Outcome: 4 (good)  Gastrointestinal Gastrointestinal Symptoms Reported: Constipation Gastrointestinal Management Strategies: Medication therapy Gastrointestinal Self-Management Outcome: 3 (uncertain) Gastrointestinal Comment: Continues to report that he has not had a successful bowel movmement due to work. RNCM encouraged patient to increase fiber and water intake while being mindful of his heart failure. Nutrition Risk Screen (CP): No indicators present  Genitourinary Genitourinary Symptoms Reported: No symptoms reported Genitourinary Self-Management Outcome: 4 (good)  Integumentary Integumentary Symptoms Reported: No symptoms reported    Musculoskeletal Musculoskelatal Symptoms Reviewed: No symptoms reported Musculoskeletal Self-Management Outcome: 4 (good) Falls in the past year?: No Number of falls in past year: 1 or less Was there an injury with Fall?: No Fall Risk Category Calculator: 0 Patient Fall Risk Level: Low Fall Risk Patient at Risk for Falls Due to: No Fall Risks Fall risk Follow up: Falls evaluation completed  Psychosocial Psychosocial Symptoms Reported: No symptoms reported Behavioral Health Self-Management Outcome: 4 (good) Techniques to Cope with Loss/Stress/Change: Not applicable Quality of Family Relationships: helpful, involved, supportive Do you feel physically threatened by others?: No      05/16/2024    9:59 AM  Depression screen PHQ 2/9  Decreased Interest 0  Down, Depressed, Hopeless 0  PHQ - 2 Score 0    Vitals:   05/16/24 0955  BP: (!) 152/71    Medications Reviewed Today     Reviewed by Bertrum Rosina HERO, RN (Registered Nurse) on 05/16/24 at 1002  Med List Status: <None>   Medication Order Taking? Sig Documenting Provider Last Dose Status Informant  Alpha-Lipoic Acid 600 MG CAPS [id number]  Take 1 capsule  (600 mg total) by mouth in the morning. For neuropathy  Patient not taking: Reported on 05/16/2024   Jolinda Norene HERO, DO  Active Spouse/Significant  Other  amLODipine  (NORVASC ) 10 MG tablet [id number] Yes Take 1 tablet (10 mg total) by mouth daily. Per Delsie Riggs renal Jolinda Norene HERO, DO  Active   aspirin  81 MG EC tablet [id number] Yes Take 1 tablet (81 mg total) by mouth daily. Swallow whole. Kroeger, Krista M., PA-C  Active Spouse/Significant Other  atorvastatin  (LIPITOR ) 80 MG tablet [id number] Yes Take 1 tablet (80 mg total) by mouth daily. Jolinda Norene M, DO  Active   carvedilol  (COREG ) 12.5 MG tablet [id number] Yes TAKE ONE TABLET TWICE DAILY WITH MEAL(S) Lavona Agent, MD  Active   cloNIDine  (CATAPRES ) 0.3 MG tablet [id number] Yes TAKE (1) TABLET TWICE A DAY for blood pressure Jolinda Norene M, DO  Active   Continuous Glucose Receiver (FREESTYLE LIBRE 3 READER) DEVI [id number]  Check BGs continuously E11.22, N18.2, Z79.4  Patient not taking: Reported on 05/16/2024   Jolinda Norene M, DO  Active   Continuous Glucose Sensor (FREESTYLE LIBRE 3 PLUS SENSOR) MISC [id number]  Check BG continuously E11.22, N18.2, Z79.4. Change sensor every 15 days.  Patient not taking: Reported on 05/16/2024   Jolinda Norene M, DO  Active   dapagliflozin  propanediol (FARXIGA ) 10 MG TABS tablet [id number] Yes Take 1 tablet (10 mg total) by mouth daily before breakfast. Jolinda Norene M, DO  Active   furosemide  (LASIX ) 40 MG tablet [id number] Yes Take 1 tablet (40 mg total) by mouth daily. Jolinda Norene M, DO  Active   gabapentin  (NEURONTIN ) 100 MG capsule [id number] Yes Take 1 capsule (100 mg total) by mouth 2 (two) times daily. Jolinda Norene M, DO  Active   insulin  degludec (TRESIBA  FLEXTOUCH) 100 UNIT/ML FlexTouch Pen [id number] Yes INJECT 38 UP TO 50 UNITS DAILY AS DIRECTED. Jolinda Norene M, DO  Active   Insulin  Pen Needle (PEN NEEDLES) 31G X 5 MM MISC [id number] Yes Use daily with insulin   Dx E11.9 Jolinda Norene M, DO  Active Spouse/Significant Other  isosorbide  mononitrate (IMDUR ) 60 MG 24 hr tablet [id number] Yes Take 1 tablet (60 mg total) by mouth daily. Lavona Agent, MD  Active   losartan (COZAAR) 25 MG tablet [id number] Yes Take 25 mg by mouth daily. Per renal [provider]  Active Spouse/Significant Other  nitroGLYCERIN  (NITROSTAT ) 0.4 MG SL tablet [id number] Yes Place 1 tablet (0.4 mg total) under the tongue every 5 (five) minutes as needed for chest pain. Jolinda Norene M, DO  Active   pantoprazole  (PROTONIX ) 40 MG tablet [id number] Yes Take 1 tablet (40 mg total) by mouth daily. Jolinda Norene M, DO  Active   spironolactone  (ALDACTONE ) 25 MG tablet [id number] Yes Take 25 mg by mouth daily. Take 1/2 tablet daily. Rachele Gaynell RAMAN, MD  Active Self            Recommendation:   Continue Current Plan of Care  Follow Up Plan:   Telephone follow-up in 1 month  Rosina Forte, BSN RN [facility], [facility] RN Care Manager Direct Dial: [phone]  Fax: [phone]

## 2024-05-16 NOTE — Patient Instructions (Signed)
 Visit Information  Thank you for taking time to visit with me today. Please don't hesitate to contact me if I can be of assistance to you before our next scheduled appointment.  Your next care management appointment is by telephone on 06-15-2024 at 9:00 am  Telephone follow-up in 1 month  Please call the care guide team at [phone] if you need to cancel, schedule, or reschedule an appointment.   Please call the Suicide and Crisis Lifeline: 988 call the [place]  National Suicide Prevention Lifeline: 347-876-1387 or TTY: 604-814-5875 TTY 706-191-9299) to talk to a trained counselor call 1-800-273-TALK (toll free, 24 hour hotline) call the [facility]: (775)204-7990 call 911 if you are experiencing a Mental Health or Behavioral Health Crisis or need someone to talk to.  Rosina Forte, BSN RN [facility], [facility] RN Care Manager Direct Dial: [phone]  Fax: [phone]

## 2024-05-28 NOTE — Telephone Encounter (Signed)
 Copied from CRM [id number]. Topic: Clinical - Request for Lab/Test Order >> May 28, 2024 11:09 AM [place] ORN wrote: Reason for RMF:Ejupzwu'd spouse contacted the office to schedule a lab appointment. Informed caller that no lab orders are currently on file; therefore, we are unable to schedule at this time. She stated that his nephrologists office advised last week that they would send the orders. Caller is requesting to have the labs scheduled once orders are received. Asked if our clinic could request the orders on the patient's behalf. call back [phone]

## 2024-05-30 ENCOUNTER — Other Ambulatory Visit

## 2024-05-30 DIAGNOSIS — E119 Type 2 diabetes mellitus without complications: Secondary | ICD-10-CM | POA: Diagnosis not present

## 2024-05-30 DIAGNOSIS — E211 Secondary hyperparathyroidism, not elsewhere classified: Secondary | ICD-10-CM | POA: Diagnosis not present

## 2024-05-30 DIAGNOSIS — R809 Proteinuria, unspecified: Secondary | ICD-10-CM | POA: Diagnosis not present

## 2024-05-30 DIAGNOSIS — D631 Anemia in chronic kidney disease: Secondary | ICD-10-CM | POA: Diagnosis not present

## 2024-05-30 DIAGNOSIS — N189 Chronic kidney disease, unspecified: Secondary | ICD-10-CM | POA: Diagnosis not present

## 2024-05-30 DIAGNOSIS — I10 Essential (primary) hypertension: Secondary | ICD-10-CM | POA: Diagnosis not present

## 2024-06-06 DIAGNOSIS — N184 Chronic kidney disease, stage 4 (severe): Secondary | ICD-10-CM | POA: Diagnosis not present

## 2024-06-06 DIAGNOSIS — E211 Secondary hyperparathyroidism, not elsewhere classified: Secondary | ICD-10-CM | POA: Diagnosis not present

## 2024-06-06 DIAGNOSIS — N049 Nephrotic syndrome with unspecified morphologic changes: Secondary | ICD-10-CM | POA: Diagnosis not present

## 2024-06-06 DIAGNOSIS — I5032 Chronic diastolic (congestive) heart failure: Secondary | ICD-10-CM | POA: Diagnosis not present

## 2024-06-13 DIAGNOSIS — K219 Gastro-esophageal reflux disease without esophagitis: Secondary | ICD-10-CM | POA: Diagnosis not present

## 2024-06-13 DIAGNOSIS — E785 Hyperlipidemia, unspecified: Secondary | ICD-10-CM | POA: Diagnosis not present

## 2024-06-13 DIAGNOSIS — E1142 Type 2 diabetes mellitus with diabetic polyneuropathy: Secondary | ICD-10-CM | POA: Diagnosis not present

## 2024-06-13 DIAGNOSIS — Z794 Long term (current) use of insulin: Secondary | ICD-10-CM | POA: Diagnosis not present

## 2024-06-13 DIAGNOSIS — E1122 Type 2 diabetes mellitus with diabetic chronic kidney disease: Secondary | ICD-10-CM | POA: Diagnosis not present

## 2024-06-13 DIAGNOSIS — G621 Alcoholic polyneuropathy: Secondary | ICD-10-CM | POA: Diagnosis not present

## 2024-06-13 DIAGNOSIS — E1151 Type 2 diabetes mellitus with diabetic peripheral angiopathy without gangrene: Secondary | ICD-10-CM | POA: Diagnosis not present

## 2024-06-13 DIAGNOSIS — N2581 Secondary hyperparathyroidism of renal origin: Secondary | ICD-10-CM | POA: Diagnosis not present

## 2024-06-13 DIAGNOSIS — Z008 Encounter for other general examination: Secondary | ICD-10-CM | POA: Diagnosis not present

## 2024-06-13 DIAGNOSIS — N184 Chronic kidney disease, stage 4 (severe): Secondary | ICD-10-CM | POA: Diagnosis not present

## 2024-06-13 DIAGNOSIS — I509 Heart failure, unspecified: Secondary | ICD-10-CM | POA: Diagnosis not present

## 2024-06-13 DIAGNOSIS — I25119 Atherosclerotic heart disease of native coronary artery with unspecified angina pectoris: Secondary | ICD-10-CM | POA: Diagnosis not present

## 2024-06-13 DIAGNOSIS — E1159 Type 2 diabetes mellitus with other circulatory complications: Secondary | ICD-10-CM | POA: Diagnosis not present

## 2024-06-14 ENCOUNTER — Encounter: Payer: Self-pay | Admitting: Family Medicine

## 2024-06-15 ENCOUNTER — Other Ambulatory Visit: Payer: Self-pay | Admitting: *Deleted

## 2024-06-15 NOTE — Patient Instructions (Signed)
 Visit Information  Thank you for taking time to visit with me today. Please don't hesitate to contact me if I can be of assistance to you before our next scheduled appointment.  Your next care management appointment is by telephone on 07/17/2024 at 10:30 AM   Telephone follow-up in 1 month  Please call the care guide team at [phone] if you need to cancel, schedule, or reschedule an appointment.   Please call the Suicide and Crisis Lifeline: 988 call the [place]  National Suicide Prevention Lifeline: 505-528-4816 or TTY: 323 819 1677 TTY (747) 186-2868) to talk to a trained counselor call 1-800-273-TALK (toll free, 24 hour hotline) call the [facility]: [phone] call 911 if you are experiencing a Mental Health or Behavioral Health Crisis or need someone to talk to.  Hendricks Her RN, BSN  [place] [facility] RN Case Information systems manager [phone]

## 2024-06-15 NOTE — Patient Outreach (Signed)
 Complex Care Management   Visit Note  06/15/2024  Name:  [name] MRN: [id number] DOB: [birth year]  Situation: Referral received for Complex Care Management related to Heart Failure and Chronic Kidney Disease I obtained verbal consent from Patient.  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   Chronic kidney disease    Complication of anesthesia    Hard to wake   Degenerative joint disease (DJD) of lumbar spine    Diabetes mellitus without complication (HCC)    Type II   Essential hypertension 02/23/2017   Lumbar stenosis    NSTEMI (non-ST elevated myocardial infarction) (HCC) 06/07/2021   Sleep apnea     Assessment: Patient Reported Symptoms:  Cognitive Cognitive Status: No symptoms reported Cognitive/Intellectual Conditions Management [RPT]: None reported or documented in medical history or problem list   Health Maintenance Behaviors: Annual physical exam Healing Pattern: Average Health Facilitated by: Rest  Neurological Neurological Review of Symptoms: Vision changes Neurological Management Strategies: Medication therapy, Routine screening Neurological Self-Management Outcome: 4 (good)  HEENT HEENT Symptoms Reported: Tearing HEENT Management Strategies: Routine screening HEENT Self-Management Outcome: 4 (good)    Cardiovascular Cardiovascular Symptoms Reported: No symptoms reported Does patient have uncontrolled Hypertension?: No Cardiovascular Management Strategies: Medication therapy Weight: 215 lb (97.5 kg) (06/14/2024) Cardiovascular Self-Management Outcome: 4 (good)  Respiratory Respiratory Symptoms Reported: Dry cough, Wheezing Additional Respiratory Details: reports wheezing at night Respiratory Management Strategies: Coping strategies Respiratory Self-Management Outcome: 4 (good)  Endocrine Endocrine Symptoms Reported: Blurry  vision Is patient diabetic?: Yes Is patient checking blood sugars at home?: No List most recent blood sugar readings, include date and time of day: report sensor ran out yesterday Endocrine Self-Management Outcome: 4 (good)  Gastrointestinal Gastrointestinal Symptoms Reported: Constipation (Miralax ) Other Gastrointestinal Symptoms: indigestion Gastrointestinal Management Strategies: Medication therapy Gastrointestinal Self-Management Outcome: 4 (good) Nutrition Risk Screen (CP): No indicators present  Genitourinary Genitourinary Symptoms Reported: No symptoms reported Genitourinary Management Strategies: Medication therapy Genitourinary Self-Management Outcome: 4 (good)  Integumentary Integumentary Symptoms Reported: No symptoms reported Skin Management Strategies: Routine screening Skin Self-Management Outcome: 4 (good)  Musculoskeletal Musculoskelatal Symptoms Reviewed: Back pain Additional Musculoskeletal Details: reports pain often back surgery several years ago Musculoskeletal Management Strategies: Coping strategies Musculoskeletal Self-Management Outcome: 4 (good) Falls in the past year?: No Number of falls in past year: 1 or less Was there an injury with Fall?: No Fall Risk Category Calculator: 0 Patient Fall Risk Level: Low Fall Risk Patient at Risk for Falls Due to: No Fall Risks Fall risk Follow up: Falls evaluation completed  Psychosocial Psychosocial Symptoms Reported: No symptoms reported Behavioral Management Strategies: Coping strategies Behavioral Health Self-Management Outcome: 4 (good) Major Change/Loss/Stressor/Fears (CP): Denies Techniques to Cope with Loss/Stress/Change: Not applicable Quality of Family Relationships: helpful, involved, supportive Do you feel physically threatened by others?: No      06/15/2024    9:33 AM  Depression screen PHQ 2/9  Decreased Interest 0  Down, Depressed, Hopeless 0  PHQ - 2 Score 0    There were no vitals filed for this  visit.  Medications Reviewed Today     Reviewed by Kay Hendricks MATSU, RN (Case Manager) on 06/15/24 at [phone]  Med List Status: <None>   Medication Order Taking? Sig Documenting Provider Last Dose Status Informant  Alpha-Lipoic Acid 600 MG CAPS [id number]  Take 1 capsule (600 mg total) by mouth in the morning.  For neuropathy  Patient not taking: Reported on 06/15/2024   Jolinda Norene HERO, DO  Consider Medication Status and Discontinue Spouse/Significant Other  amLODipine  (NORVASC ) 10 MG tablet [id number] Yes Take 1 tablet (10 mg total) by mouth daily. Per Delsie Riggs renal Jolinda Norene HERO, DO  Active   aspirin  81 MG EC tablet [id number] Yes Take 1 tablet (81 mg total) by mouth daily. Swallow whole. Kroeger, Krista M., PA-C  Active Spouse/Significant Other  atorvastatin  (LIPITOR ) 80 MG tablet [id number] Yes Take 1 tablet (80 mg total) by mouth daily. Jolinda Norene M, DO  Active   carvedilol  (COREG ) 12.5 MG tablet [id number] Yes TAKE ONE TABLET TWICE DAILY WITH MEAL(S) Lavona Agent, MD  Active   cloNIDine  (CATAPRES ) 0.3 MG tablet [id number] Yes TAKE (1) TABLET TWICE A DAY for blood pressure Jolinda Norene M, DO  Active   Continuous Glucose Receiver (FREESTYLE LIBRE 3 READER) DEVI [id number] Yes Check BGs continuously E11.22, N18.2, Z79.4 Jolinda Norene M, DO  Active   Continuous Glucose Sensor (FREESTYLE LIBRE 3 PLUS SENSOR) MISC [id number] Yes Check BG continuously E11.22, N18.2, Z79.4. Change sensor every 15 days. Jolinda Norene M, DO  Active   dapagliflozin  propanediol (FARXIGA ) 10 MG TABS tablet [id number] Yes Take 1 tablet (10 mg total) by mouth daily before breakfast. Jolinda Norene M, DO  Active   furosemide  (LASIX ) 40 MG tablet [id number] Yes Take 1 tablet (40 mg total) by mouth daily. Jolinda Norene M, DO  Active   gabapentin  (NEURONTIN ) 100 MG capsule [id number] Yes Take 1 capsule (100 mg total) by mouth 2 (two) times daily. Jolinda Norene M, DO  Active   insulin  degludec  (TRESIBA  FLEXTOUCH) 100 UNIT/ML FlexTouch Pen [id number] Yes INJECT 38 UP TO 50 UNITS DAILY AS DIRECTED. Jolinda Norene M, DO  Active   Insulin  Pen Needle (PEN NEEDLES) 31G X 5 MM MISC [id number] Yes Use daily with insulin  Dx E11.9 Jolinda Norene M, DO  Active Spouse/Significant Other  isosorbide  mononitrate (IMDUR ) 60 MG 24 hr tablet [id number] Yes Take 1 tablet (60 mg total) by mouth daily. Lavona Agent, MD  Active   losartan (COZAAR) 25 MG tablet [id number] Yes Take 25 mg by mouth daily. Per renal [provider]  Active Spouse/Significant Other  nitroGLYCERIN  (NITROSTAT ) 0.4 MG SL tablet [id number] Yes Place 1 tablet (0.4 mg total) under the tongue every 5 (five) minutes as needed for chest pain. Jolinda Norene M, DO  Active   pantoprazole  (PROTONIX ) 40 MG tablet [id number] Yes Take 1 tablet (40 mg total) by mouth daily. Jolinda Norene M, DO  Active   spironolactone  (ALDACTONE ) 25 MG tablet [id number] Yes Take 25 mg by mouth daily. Take 1/2 tablet daily. Rachele Gaynell RAMAN, MD  Active Self            Recommendation:   Continue Current Plan of Care  Follow Up Plan:   Telephone follow-up in 1 month  Hendricks Her RN, BSN  [place] [facility] RN Case Information systems manager [phone]

## 2024-06-17 NOTE — Progress Notes (Unsigned)
 Cardiology Office Note:   Date:  06/20/2024  ID:  [name], DOB [birth year], MRN [id number] PCP: Jolinda Norene HERO, DO  [facility] Providers Cardiologist:  Lynwood Schilling, MD {  History of Present Illness:   [name] is a 68 y.o. 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    His last echo was in 2025.  His EF was 65%.there was a small pericardial effusion.  No other significant findings.   Well-preserved ejection fraction.  Cardiac cath at that time demonstrated branch vessel distal vessel disease.  He had had previous stent in his circumflex and obtuse marginal that were patent.  He presents today for follow-up.  He has had increasing fatigue.  He can only walk about 25 yards before he gets tired and short of breath.  It does seem like he has some orthopedic issues that are limiting.  His balance and gait are reduced.  He is fatigued all the time.  He does not use CPAP and wants to get home sleep study sent to him but it does not happen.  He has untreated sleep apnea.  He does get dyspneic when he is trying to walk 25 yards and he does have to stop.  He is not describing chest pressure, neck or arm discomfort.  He has had no new palpitations, presyncope or syncope.  He had no weight gain or edema.  .As stated in the HPI and negative for all other systems.  ROS: As stated in the HPI and negative for all other systems.  Studies Reviewed:    EKG:     03/19/2024 sinus rhythm, rate 55, axis within normal limits, intervals within normal limits, no acute ST-T wave changes.  Risk Assessment/Calculations:              Physical Exam:   VS:  BP 102/60   Pulse (!) 56   Ht 6' (1.829 m)   Wt 217 lb (98.4 kg)   BMI 29.43 kg/m    Wt Readings from Last 3 Encounters:  06/20/24 217 lb (98.4 kg)  06/15/24 215 lb (97.5 kg)  05/16/24 214 lb (97.1 kg)     GEN: Well  nourished, well developed in no acute distress NECK: No JVD; No carotid bruits CARDIAC: RRR, no murmurs, rubs, gallops RESPIRATORY:  Clear to auscultation without rales, wheezing or rhonchi  ABDOMEN: Soft, non-tender, non-distended EXTREMITIES:  No edema; No deformity   ASSESSMENT AND PLAN:   SOB: The patient does have dyspnea which could be multifactorial.  He has fatigue.  I am going to order a PET scan to rule out ischemia although we do have to have really high risk findings before he ever consent to a catheterization given his renal insufficiency.  CAD: As above I am going to manage this medically but screen for high risk findings.  CKD IV: He said, I would rather die of a heart attack than have dialysis.  He follows with nephrology.  We will discuss further management pending the results of the stress test above.  SLEEP APNEA: We will contact neurology and let them know he never got his home sleep study.  HTN: His blood pressure is running low and he is very fatigued.  I am going to reduce his clonidine  to 0.2 mg twice daily.  DM: A1c is 6.1.  No change in therapy.  Fatigue: I am going to check a  TSH.  This is probably related more to untreated sleep apnea.      Follow up with me in 2 months  Signed, Lynwood Schilling, MD

## 2024-06-20 ENCOUNTER — Encounter: Payer: Self-pay | Admitting: Cardiology

## 2024-06-20 ENCOUNTER — Other Ambulatory Visit

## 2024-06-20 ENCOUNTER — Ambulatory Visit: Admitting: Cardiology

## 2024-06-20 VITALS — BP 102/60 | HR 56 | Ht 72.0 in | Wt 217.0 lb

## 2024-06-20 DIAGNOSIS — I10 Essential (primary) hypertension: Secondary | ICD-10-CM

## 2024-06-20 DIAGNOSIS — I2510 Atherosclerotic heart disease of native coronary artery without angina pectoris: Secondary | ICD-10-CM

## 2024-06-20 DIAGNOSIS — R0602 Shortness of breath: Secondary | ICD-10-CM | POA: Diagnosis not present

## 2024-06-20 MED ORDER — CLONIDINE HCL 0.2 MG PO TABS: 0.2000 mg | ORAL_TABLET | Freq: Two times a day (BID) | ORAL | 1 refills | Status: DC

## 2024-06-20 NOTE — Patient Instructions (Addendum)
 Medication Instructions:  Your physician has recommended you make the following change in your medication:  Decrease clonidine  to 0.2 mg twice daily Continue all other medications as prescribed  Labwork: TSH  Non-fasting Please contact your family doctor's office to schedule this test  Testing/Procedures: Cardiac PET-see instructions below  Follow-Up: Your physician recommends that you schedule a follow-up appointment in: 2 months  Any Other Special Instructions Will Be Listed Below (If Applicable). Please contact Dr. Buck Daubs office about your home sleep study [phone])  If you need a refill on your cardiac medications before your next appointment, please call your pharmacy.     Please report to Radiology at the [facility] Main Entrance 30 minutes early for your test.  [address] [place], [place] [address]                         How to Prepare for Your Cardiac PET/CT Stress Test:  Nothing to eat or drink, except water, 3 hours prior to arrival time.  NO caffeine/decaffeinated products, or chocolate 12 hours prior to arrival. (Please note decaffeinated beverages (teas/coffees) still contain caffeine).  If you have caffeine within 12 hours prior, the test will need to be rescheduled.  Medication instructions: Do not take erectile dysfunction medications for 72 hours prior to test (sildenafil , tadalafil) Do not take nitrates (isosorbide  mononitrate, Ranexa) the day before or day of test Do not take tamsulosin  the day before or morning of test Hold theophylline containing medications for 12 hours. Hold Dipyridamole 48 hours prior to the test.  Diabetic Preparation: If able to eat breakfast prior to 3 hour fasting, you may take all medications, including your insulin . Do not worry if you miss your breakfast dose of insulin  - start at your next meal. If you do not eat prior to 3 hour fast-Hold all diabetes (oral and insulin ) medications. Patients who  wear a continuous glucose monitor MUST remove the device prior to scanning.  You may take your remaining medications with water.  NO perfume, cologne or lotion on chest or abdomen area. FEMALES - Please avoid wearing dresses to this appointment.  Total time is 1 to 2 hours; you may want to bring reading material for the waiting time.  IF YOU THINK YOU MAY BE PREGNANT, OR ARE NURSING PLEASE INFORM THE TECHNOLOGIST.  In preparation for your appointment, medication and supplies will be purchased.  Appointment availability is limited, so if you need to cancel or reschedule, please call the Radiology Department Scheduler at [phone] 24 hours in advance to avoid a cancellation fee of $100.00  What to Expect When you Arrive:  Once you arrive and check in for your appointment, you will be taken to a preparation room within the Radiology Department.  A technologist or Nurse will obtain your medical history, verify that you are correctly prepped for the exam, and explain the procedure.  Afterwards, an IV will be started in your arm and electrodes will be placed on your skin for EKG monitoring during the stress portion of the exam. Then you will be escorted to the PET/CT scanner.  There, staff will get you positioned on the scanner and obtain a blood pressure and EKG.  During the exam, you will continue to be connected to the EKG and blood pressure machines.  A small, safe amount of a radioactive tracer will be injected in your IV to obtain a series of pictures of your heart along with an  injection of a stress agent.    After your Exam:  It is recommended that you eat a meal and drink a caffeinated beverage to counter act any effects of the stress agent.  Drink plenty of fluids for the remainder of the day and urinate frequently for the first couple of hours after the exam.  Your doctor will inform you of your test results within 7-10 business days.  For more information and frequently asked questions,  please visit our website: [email or web address]  For questions about your test or how to prepare for your test, please call: Cardiac Imaging Nurse Navigators Office: [phone]

## 2024-06-21 ENCOUNTER — Ambulatory Visit: Payer: Self-pay | Admitting: Cardiology

## 2024-06-21 LAB — TSH: TSH: 3.53 u[IU]/mL (ref 0.450–4.500)

## 2024-06-22 ENCOUNTER — Ambulatory Visit: Admitting: Cardiology

## 2024-07-04 ENCOUNTER — Other Ambulatory Visit: Payer: Self-pay | Admitting: Cardiology

## 2024-07-10 ENCOUNTER — Other Ambulatory Visit: Payer: Self-pay | Admitting: Cardiology

## 2024-07-17 ENCOUNTER — Other Ambulatory Visit: Payer: Self-pay | Admitting: *Deleted

## 2024-07-17 NOTE — Patient Outreach (Signed)
 Complex Care Management   Visit Note  07/17/2024  Name:  [name] MRN: [id number] DOB: [birth year]  Situation: Referral received for Complex Care Management related to Heart Failure and Chronic Kidney Disease I obtained verbal consent from Patient.  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   Chronic kidney disease    Complication of anesthesia    Hard to wake   Degenerative joint disease (DJD) of lumbar spine    Diabetes mellitus without complication (HCC)    Type II   Essential hypertension 02/23/2017   Lumbar stenosis    NSTEMI (non-ST elevated myocardial infarction) (HCC) 06/07/2021   Sleep apnea     Assessment: Patient Reported Symptoms:  Cognitive Cognitive Status: No symptoms reported Cognitive/Intellectual Conditions Management [RPT]: None reported or documented in medical history or problem list   Health Maintenance Behaviors: Annual physical exam Healing Pattern: Slow Health Facilitated by: Rest  Neurological Neurological Review of Symptoms: Numbness, Other: [place] Neurological Symptoms/Conditions [RPT]: tingling in bilateral feet but most right leg Neurological Self-Management Outcome: 3 (uncertain)  HEENT HEENT Symptoms Reported: Tearing HEENT Self-Management Outcome: 4 (good)    Cardiovascular Cardiovascular Symptoms Reported: No symptoms reported Does patient have uncontrolled Hypertension?: No Cardiovascular Management Strategies: Medication therapy, Routine screening Cardiovascular Self-Management Outcome: 4 (good)  Respiratory Respiratory Symptoms Reported: Productive cough Other Respiratory Symptoms: reports sputum in morning Respiratory Self-Management Outcome: 4 (good)  Endocrine Endocrine Symptoms Reported: No symptoms reported Is patient diabetic?: No Is patient checking blood sugars at home?: No Endocrine  Self-Management Outcome: 4 (good)  Gastrointestinal Gastrointestinal Symptoms Reported: Constipation Gastrointestinal Management Strategies: Medication therapy Gastrointestinal Self-Management Outcome: 4 (good) Nutrition Risk Screen (CP): No indicators present  Genitourinary Genitourinary Symptoms Reported: No symptoms reported Genitourinary Self-Management Outcome: 4 (good)  Integumentary Integumentary Symptoms Reported: No symptoms reported Skin Management Strategies: Routine screening Skin Self-Management Outcome: 4 (good)  Musculoskeletal Musculoskelatal Symptoms Reviewed: No symptoms reported Musculoskeletal Management Strategies: Routine screening Musculoskeletal Self-Management Outcome: 4 (good) Falls in the past year?: No Number of falls in past year: 1 or less Was there an injury with Fall?: No Fall Risk Category Calculator: 0 Patient Fall Risk Level: Low Fall Risk Patient at Risk for Falls Due to: No Fall Risks Fall risk Follow up: Falls evaluation completed  Psychosocial Psychosocial Symptoms Reported: No symptoms reported Behavioral Management Strategies: Support system Behavioral Health Self-Management Outcome: 4 (good) Major Change/Loss/Stressor/Fears (CP): Denies Techniques to Cope with Loss/Stress/Change: Not applicable Quality of Family Relationships: helpful, involved, supportive Do you feel physically threatened by others?: No    07/17/2024    PHQ2-9 Depression Screening   Little interest or pleasure in doing things Not at all  Feeling down, depressed, or hopeless Not at all  PHQ-2 - Total Score 0  Trouble falling or staying asleep, or sleeping too much    Feeling tired or having little energy    Poor appetite or overeating     Feeling bad about yourself - or that you are a failure or have let yourself or your family down    Trouble concentrating on things, such as reading the newspaper or watching television    Moving or speaking so slowly that other people  could have noticed.  Or the opposite - being so fidgety or restless that you have been moving around a lot more than usual    Thoughts that you would be better  off dead, or hurting yourself in some way    PHQ2-9 Total Score    If you checked off any problems, how difficult have these problems made it for you to do your work, take care of things at home, or get along with other people    Depression Interventions/Treatment      There were no vitals filed for this visit.  Medications Reviewed Today     Reviewed by Bertrum Rosina HERO, RN (Registered Nurse) on 07/17/24 at 1034  Med List Status: <None>   Medication Order Taking? Sig Documenting Provider Last Dose Status Informant  Alpha-Lipoic Acid 600 MG CAPS [id number]  Take 1 capsule (600 mg total) by mouth in the morning. For neuropathy  Patient not taking: Reported on 07/17/2024   Jolinda Norene HERO, DO  Consider Medication Status and Discontinue Spouse/Significant Other  amLODipine  (NORVASC ) 10 MG tablet [id number] Yes Take 1 tablet (10 mg total) by mouth daily. Per Delsie Riggs renal Jolinda Norene HERO, DO  Active   aspirin  81 MG EC tablet [id number] Yes Take 1 tablet (81 mg total) by mouth daily. Swallow whole. Kroeger, Krista M., PA-C  Active Spouse/Significant Other  atorvastatin  (LIPITOR ) 80 MG tablet [id number] Yes Take 1 tablet (80 mg total) by mouth daily. Jolinda Norene M, DO  Active   carvedilol  (COREG ) 12.5 MG tablet [id number] Yes TAKE ONE TABLET TWICE DAILY WITH MEAL(S) Lavona Agent, MD  Active   cloNIDine  (CATAPRES ) 0.2 MG tablet [id number] Yes Take 1 tablet (0.2 mg total) by mouth 2 (two) times daily. Lavona Agent, MD  Active   Continuous Glucose Receiver (FREESTYLE LIBRE 3 READER) DEVI [id number] Yes Check BGs continuously E11.22, N18.2, Z79.4 Jolinda Norene M, DO  Active   Continuous Glucose Sensor (FREESTYLE LIBRE 3 PLUS SENSOR) MISC [id number] Yes Check BG continuously E11.22, N18.2, Z79.4. Change sensor every 15 days.  Jolinda Norene M, DO  Active   dapagliflozin  propanediol (FARXIGA ) 10 MG TABS tablet [id number] Yes Take 1 tablet (10 mg total) by mouth daily before breakfast. Jolinda Norene M, DO  Active   furosemide  (LASIX ) 40 MG tablet [id number] Yes Take 1 tablet (40 mg total) by mouth daily. Jolinda Norene M, DO  Active   gabapentin  (NEURONTIN ) 100 MG capsule [id number] Yes Take 1 capsule (100 mg total) by mouth 2 (two) times daily. Jolinda Norene M, DO  Active   insulin  degludec (TRESIBA  FLEXTOUCH) 100 UNIT/ML FlexTouch Pen [id number] Yes INJECT 38 UP TO 50 UNITS DAILY AS DIRECTED. Jolinda Norene M, DO  Active   Insulin  Pen Needle (PEN NEEDLES) 31G X 5 MM MISC [id number] Yes Use daily with insulin  Dx E11.9 Jolinda Norene M, DO  Active Spouse/Significant Other  isosorbide  mononitrate (IMDUR ) 60 MG 24 hr tablet [id number] Yes TAKE 1 TABLET DAILY Hochrein, Agent, MD  Active   losartan (COZAAR) 25 MG tablet [id number] Yes Take 25 mg by mouth daily. Per renal [provider]  Active Spouse/Significant Other  nitroGLYCERIN  (NITROSTAT ) 0.4 MG SL tablet [id number] Yes Place 1 tablet (0.4 mg total) under the tongue every 5 (five) minutes as needed for chest pain. Jolinda Norene M, DO  Active   pantoprazole  (PROTONIX ) 40 MG tablet [id number] Yes Take 1 tablet (40 mg total) by mouth daily. Jolinda Norene M, DO  Active   spironolactone  (ALDACTONE ) 25 MG tablet [id number] Yes Take 25 mg by mouth daily. Take 1/2 tablet daily. Rachele Gaynell RAMAN, MD  Active Self            Recommendation:  Continue Current Plan of Care Weigh Daily  Follow Up Plan:   Telephone follow-up in 1 month  Rosina Forte, BSN RN [facility], [facility] RN Care Manager Direct Dial: [phone]  Fax: [phone]

## 2024-07-17 NOTE — Patient Instructions (Signed)
 Visit Information  Thank you for taking time to visit with me today. Please don't hesitate to contact me if I can be of assistance to you before our next scheduled appointment.  Your next care management appointment is by telephone on 08-16-2024 at 10:30 am  Telephone follow-up in 1 month  Please call the care guide team at [phone] if you need to cancel, schedule, or reschedule an appointment.   Please call the Suicide and Crisis Lifeline: 988 call the [place]  National Suicide Prevention Lifeline: 601 648 1197 or TTY: 647-111-2871 TTY 863 036 1792) to talk to a trained counselor call 1-800-273-TALK (toll free, 24 hour hotline) call the [facility]: [phone] call 911 if you are experiencing a Mental Health or Behavioral Health Crisis or need someone to talk to.  Rosina Forte, BSN RN [facility], [facility] RN Care Manager Direct Dial: [phone]  Fax: [phone]

## 2024-07-25 ENCOUNTER — Encounter: Payer: Self-pay | Admitting: Family Medicine

## 2024-07-25 ENCOUNTER — Ambulatory Visit (INDEPENDENT_AMBULATORY_CARE_PROVIDER_SITE_OTHER): Admitting: Family Medicine

## 2024-07-25 VITALS — BP 133/64 | HR 57 | Temp 97.6°F | Ht 72.0 in | Wt 220.0 lb

## 2024-07-25 DIAGNOSIS — Z794 Long term (current) use of insulin: Secondary | ICD-10-CM | POA: Diagnosis not present

## 2024-07-25 DIAGNOSIS — E1159 Type 2 diabetes mellitus with other circulatory complications: Secondary | ICD-10-CM | POA: Diagnosis not present

## 2024-07-25 DIAGNOSIS — E1122 Type 2 diabetes mellitus with diabetic chronic kidney disease: Secondary | ICD-10-CM

## 2024-07-25 DIAGNOSIS — E1169 Type 2 diabetes mellitus with other specified complication: Secondary | ICD-10-CM | POA: Diagnosis not present

## 2024-07-25 DIAGNOSIS — J3089 Other allergic rhinitis: Secondary | ICD-10-CM | POA: Diagnosis not present

## 2024-07-25 DIAGNOSIS — E1140 Type 2 diabetes mellitus with diabetic neuropathy, unspecified: Secondary | ICD-10-CM | POA: Diagnosis not present

## 2024-07-25 DIAGNOSIS — N184 Chronic kidney disease, stage 4 (severe): Secondary | ICD-10-CM | POA: Diagnosis not present

## 2024-07-25 DIAGNOSIS — I152 Hypertension secondary to endocrine disorders: Secondary | ICD-10-CM | POA: Diagnosis not present

## 2024-07-25 DIAGNOSIS — E785 Hyperlipidemia, unspecified: Secondary | ICD-10-CM

## 2024-07-25 LAB — BAYER DCA HB A1C WAIVED: HB A1C (BAYER DCA - WAIVED): 6.1 % — ABNORMAL HIGH (ref 4.8–5.6)

## 2024-07-25 MED ORDER — GABAPENTIN 100 MG PO CAPS
200.0000 mg | ORAL_CAPSULE | Freq: Two times a day (BID) | ORAL | 3 refills | Status: AC
Start: 2024-07-25 — End: ?

## 2024-07-25 MED ORDER — DESLORATADINE 5 MG PO TABS: 5.0000 mg | ORAL_TABLET | ORAL | 3 refills | Status: DC

## 2024-07-25 NOTE — Progress Notes (Signed)
 Subjective: CC:DM PCP: Clifford Clifford HERO, DO YEP:[name] is a 68 y.o. male presenting to clinic today for:  Discussed the use of AI scribe software for clinical note transcription with the patient, who gave verbal consent to proceed.  History of Present Illness   [name] is a 68 year old male with neuropathy and chronic kidney disease who presents with numbness and tingling in his feet and a request for medication adjustment.  He experiences numbness and tingling in his feet, which worsens at night and disrupts his sleep. He currently takes gabapentin , one 100mg  capsule in the morning and one at night, but feels a higher dose may be necessary as symptoms persist. He recently ran out of gabapentin  and noticed increased discomfort at night during the gap in medication.  He reports significant drainage and coughing, particularly at night, which he attributes to allergies. He is not currently taking any medication for allergies and prefers an oral medication over a nasal spray.  He is concerned about a previously used cough medicine, describing it as expensive but effective. He is cautious about medications that could affect his kidneys due to his chronic kidney disease and wants to avoid further strain.  He uses a [employer] 3 for glucose monitoring but reports it is not staying charged, believing the device is over a year old.  He has a history of a right great toe amputation and is vigilant about monitoring his feet to prevent further complications. He reports difficulty feeling sensations in his toes.      Diabetes Health Maintenance Due  Topic Date Due   OPHTHALMOLOGY EXAM  10/29/2022   FOOT EXAM  01/31/2024   HEMOGLOBIN A1C  01/22/2025    ROS: Per HPI  No Known Allergies 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   Chronic kidney disease    Complication of anesthesia     Hard to wake   Degenerative joint disease (DJD) of lumbar spine    Diabetes mellitus without complication (HCC)    Type II   Essential hypertension 02/23/2017   Lumbar stenosis    NSTEMI (non-ST elevated myocardial infarction) (HCC) 06/07/2021   Sleep apnea     Current Outpatient Medications:    amLODipine  (NORVASC ) 10 MG tablet, Take 1 tablet (10 mg total) by mouth daily. Per [name] renal, Disp: , Rfl:    aspirin  81 MG EC tablet, Take 1 tablet (81 mg total) by mouth daily. Swallow whole., Disp: 90 tablet, Rfl: 3   atorvastatin  (LIPITOR ) 80 MG tablet, Take 1 tablet (80 mg total) by mouth daily., Disp: 90 tablet, Rfl: 3   carvedilol  (COREG ) 12.5 MG tablet, TAKE ONE TABLET TWICE DAILY WITH MEAL(S), Disp: 180 tablet, Rfl: 3   cloNIDine  (CATAPRES ) 0.2 MG tablet, Take 1 tablet (0.2 mg total) by mouth 2 (two) times daily., Disp: 180 tablet, Rfl: 1   Continuous Glucose Receiver (FREESTYLE LIBRE 3 READER) DEVI, Check BGs continuously E11.22, N18.2, Z79.4, Disp: 1 each, Rfl: 0   Continuous Glucose Sensor (FREESTYLE LIBRE 3 PLUS SENSOR) MISC, Check BG continuously E11.22, N18.2, Z79.4. Change sensor every 15 days., Disp: 6 each, Rfl: 3   dapagliflozin  propanediol (FARXIGA ) 10 MG TABS tablet, Take 1 tablet (10 mg total) by mouth daily before breakfast., Disp: 90 tablet, Rfl: 5   desloratadine  (CLARINEX ) 5 MG tablet, Take 1 tablet (5 mg total) by mouth every other day. For drainage/ allergies, Disp:  45 tablet, Rfl: 3   furosemide  (LASIX ) 40 MG tablet, Take 1 tablet (40 mg total) by mouth daily., Disp: 90 tablet, Rfl: 3   insulin  degludec (TRESIBA  FLEXTOUCH) 100 UNIT/ML FlexTouch Pen, INJECT 38 UP TO 50 UNITS DAILY AS DIRECTED., Disp: 45 mL, Rfl: 3   Insulin  Pen Needle (PEN NEEDLES) 31G X 5 MM MISC, Use daily with insulin  Dx E11.9, Disp: 100 each, Rfl: 3   isosorbide  mononitrate (IMDUR ) 60 MG 24 hr tablet, TAKE 1 TABLET DAILY, Disp: 90 tablet, Rfl: 3   losartan (COZAAR) 25 MG tablet, Take 25 mg by  mouth daily. Per renal, Disp: , Rfl:    nitroGLYCERIN  (NITROSTAT ) 0.4 MG SL tablet, Place 1 tablet (0.4 mg total) under the tongue every 5 (five) minutes as needed for chest pain., Disp: 25 tablet, Rfl: 0   pantoprazole  (PROTONIX ) 40 MG tablet, Take 1 tablet (40 mg total) by mouth daily., Disp: 30 tablet, Rfl: 0   spironolactone  (ALDACTONE ) 25 MG tablet, Take 25 mg by mouth daily. Take 1/2 tablet daily., Disp: , Rfl:    Alpha-Lipoic Acid 600 MG CAPS, Take 1 capsule (600 mg total) by mouth in the morning. 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 Has another son and one daughter    Raise cattle    Social Drivers of Health   Financial Resource Strain: Low Risk  (03/19/2024)   Overall Financial Resource Strain (CARDIA)    Difficulty of Paying Living Expenses: Not hard at all  Food Insecurity: No Food Insecurity (07/17/2024)   Hunger Vital Sign    Worried About Running Out of Food in the Last Year: Never true    Ran Out of Food in the Last Year: Never true  Transportation Needs: No Transportation Needs (07/17/2024)   PRAPARE - Therapist, art (Medical): No    Lack of Transportation (Non-Medical): No  Physical Activity: Sufficiently Active (03/19/2024)   Exercise Vital Sign    Days of Exercise per Week: 5 days    Minutes of Exercise per Session: 30 min  Stress: No Stress Concern Present (03/19/2024)   [employer] of Occupational Health - Occupational Stress Questionnaire    Feeling of Stress : Not at all  Social Connections: Moderately Isolated (03/19/2024)   Social Connection and Isolation Panel    Frequency of Communication with Friends and Family: More than three times a week    Frequency of Social Gatherings with Friends  and Family: Three times a week    Attends Religious Services: Never    Active Member of Clubs or Organizations: No    Attends Banker Meetings: Never    Marital Status: Married  Catering manager Violence: Not At Risk (07/17/2024)   Humiliation, Afraid, Rape, and Kick questionnaire    Fear of Current or Ex-Partner: No    Emotionally Abused: No    Physically Abused: No    Sexually Abused: No   Family History  Problem Relation Age of Onset   Heart attack Father 27       Died suddenly   Hypertension Father    Vision loss Sister        one eye   Cancer Brother        skin   Diabetes Brother    Sleep apnea Daughter    Sleep apnea Son     Objective: Office vital signs reviewed. BP 133/64   Pulse (!) 57   Temp 97.6 F (36.4 C)   Ht 6' (1.829 m)   Wt 220 lb (99.8 kg)   SpO2 98%   BMI 29.84 kg/m   Physical Examination:  General: Awake, alert, well nourished, No acute distress HEENT: sclera white, MMM.  TMs intact bilaterally with normal light reflex.  Clear drainage noted within the nasal turbinates.  Oropharynx without erythema or masses Cardio: slightly bradycardic rate and regular rhythm, S1S2 heard, no murmurs appreciated Pulm: clear to auscultation bilaterally, no wheezes, rhonchi or rales; normal work of breathing on room air   Diabetic Foot Exam -  Simple   Simple Foot Form Diabetic Foot exam was performed with the following findings: Yes 07/25/2024  4:21 PM  Visual Inspection See comments: Yes Sensation Testing See comments: Yes Pulse Check Posterior Tibialis and Dorsalis pulse intact bilaterally: Yes Comments Absent monofilament sensation in all toes except for third digit on left foot.  He has surgically absent distal toe on the right great toe.  Mild peeling of the skin of the plantar surface of the feet     Lab Results  Component Value Date   HGBA1C 6.1 (H) 07/25/2024    Assessment/ Plan: 68 y.o. male   Type 2 diabetes mellitus with stage 4 chronic kidney disease, with long-term current use of insulin  (HCC) - Plan: Basic Metabolic Panel, Bayer DCA Hb A1c Waived, CBC, Vitamin B12  Hyperlipidemia associated with type 2 diabetes mellitus (HCC)  Hypertension associated with diabetes (HCC)  Neuropathy due to type 2 diabetes mellitus (HCC) - Plan: gabapentin  (NEURONTIN ) 100 MG capsule, CBC, Vitamin B12  Non-seasonal allergic rhinitis, unspecified trigger - Plan: desloratadine  (CLARINEX ) 5 MG tablet  Assessment and Plan    Type 2 diabetes mellitus with diabetic neuropathy, HTN, HLD, CKD and partial right great toe amputation Diabetic neuropathy with insufficient gabapentin  dosage causing nighttime discomfort. Gabapentin  effective but needs adjustment. - Increase gabapentin  to 2 tablets BID, max 4 tablets/day. - Send prescription to [facility]. - Advise monitoring for dizziness and adjust dosage if necessary. - Perform foot exam to assess neuropathy and skin condition. - Advise on vigilant foot care to prevent sores and maintain mobility.  Allergic rhinitis w/ cough Nasal drainage and nighttime coughing. Preference for oral medication over nasal spray. - Prescribe clarinex  - Gabapentin  may reduce cough due to nerve irritation. Discussed risks of sedating cough medications with gabapentin . - Prescribe allergy  medication to address post-nasal drip. - Provide a cough medication as a safety net, to be used only if  necessary.      Clifford CHRISTELLA Fielding, DO [facility] [phone]

## 2024-07-26 LAB — BASIC METABOLIC PANEL WITH GFR
BUN/Creatinine Ratio: 16 (ref 10–24)
BUN: 46 mg/dL — AB (ref 8–27)
CO2: 16 mmol/L — AB (ref 20–29)
Calcium: 8.6 mg/dL (ref 8.6–10.2)
Chloride: 102 mmol/L (ref 96–106)
Creatinine, Ser: 2.82 mg/dL — AB (ref 0.76–1.27)
Glucose: 174 mg/dL — AB (ref 70–99)
Potassium: 4.5 mmol/L (ref 3.5–5.2)
Sodium: 136 mmol/L (ref 134–144)
eGFR: 23 mL/min/1.73 — AB (ref >59)

## 2024-07-26 LAB — CBC
Hematocrit: 32.1 % — ABNORMAL LOW (ref 37.5–51.0)
Hemoglobin: 11 g/dL — ABNORMAL LOW (ref 13.0–17.7)
MCH: 31.9 pg (ref 26.6–33.0)
MCHC: 34.3 g/dL (ref 31.5–35.7)
MCV: 93 fL (ref 79–97)
Platelets: 167 x10E3/uL (ref 150–450)
RBC: 3.45 x10E6/uL — ABNORMAL LOW (ref 4.14–5.80)
RDW: 12.5 % (ref 11.6–15.4)
WBC: 10.2 x10E3/uL (ref 3.4–10.8)

## 2024-07-26 LAB — VITAMIN B12: Vitamin B-12: 601 pg/mL (ref 232–1245)

## 2024-07-30 ENCOUNTER — Ambulatory Visit: Payer: Self-pay | Admitting: Family Medicine

## 2024-08-13 ENCOUNTER — Encounter (HOSPITAL_COMMUNITY): Payer: Self-pay

## 2024-08-14 ENCOUNTER — Telehealth (HOSPITAL_COMMUNITY): Payer: Self-pay | Admitting: *Deleted

## 2024-08-14 NOTE — Telephone Encounter (Signed)
Reaching out to patient to offer assistance regarding upcoming cardiac imaging study; pt verbalizes understanding of appt date/time, parking situation and where to check in, pre-test NPO status and verified current allergies; name and call back number provided for further questions should they arise  Clifford Clement RN Navigator Cardiac Imaging [name] [phone] office [phone] cell  Patient aware to avoid caffeine and Imdur prior to his cardiac PET scan.

## 2024-08-15 ENCOUNTER — Encounter (HOSPITAL_COMMUNITY): Admission: RE | Admit: 2024-08-15 | Source: Ambulatory Visit

## 2024-08-16 ENCOUNTER — Telehealth: Admitting: *Deleted

## 2024-08-22 ENCOUNTER — Ambulatory Visit: Admitting: Cardiology

## 2024-09-05 ENCOUNTER — Other Ambulatory Visit: Payer: Self-pay | Admitting: *Deleted

## 2024-09-05 NOTE — Patient Outreach (Signed)
 Complex Care Management   Visit Note  09/05/2024  Name:  [name] MRN: [id number] DOB: 04-Dec-1955  Situation: Referral received for Complex Care Management related to Heart Failure and Chronic Kidney Disease I obtained verbal consent from Patient.  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   Chronic kidney disease    Complication of anesthesia    Hard to wake   Degenerative joint disease (DJD) of lumbar spine    Diabetes mellitus without complication (HCC)    Type II   Essential hypertension 02/23/2017   Lumbar stenosis    NSTEMI (non-ST elevated myocardial infarction) (HCC) 06/07/2021   Sleep apnea     Assessment: Patient Reported Symptoms:  Cognitive Cognitive Status: No symptoms reported Cognitive/Intellectual Conditions Management [RPT]: None reported or documented in medical history or problem list   Health Maintenance Behaviors: Annual physical exam Healing Pattern: Average Health Facilitated by: Rest  Neurological Neurological Review of Symptoms: No symptoms reported    HEENT HEENT Symptoms Reported: Tearing HEENT Management Strategies: Routine screening HEENT Self-Management Outcome: 4 (good)    Cardiovascular Cardiovascular Symptoms Reported: No symptoms reported Does patient have uncontrolled Hypertension?: No Cardiovascular Self-Management Outcome: 4 (good) Cardiovascular Comment: reports mild swelling in the evenings  Respiratory Respiratory Symptoms Reported: Shortness of breath Additional Respiratory Details: dyspnea on exertion    Endocrine Endocrine Symptoms Reported: No symptoms reported Is patient diabetic?: Yes Is patient checking blood sugars at home?: No    Gastrointestinal Gastrointestinal Symptoms Reported: Constipation Gastrointestinal Management Strategies: Coping strategies, Medication  therapy Gastrointestinal Self-Management Outcome: 4 (good) Nutrition Risk Screen (CP): No indicators present  Genitourinary Genitourinary Symptoms Reported: No symptoms reported Genitourinary Self-Management Outcome: 4 (good)  Integumentary Integumentary Symptoms Reported: No symptoms reported Skin Self-Management Outcome: 4 (good)  Musculoskeletal Musculoskelatal Symptoms Reviewed: Back pain Musculoskeletal Management Strategies: Routine screening Musculoskeletal Self-Management Outcome: 4 (good) Falls in the past year?: No Number of falls in past year: 1 or less Was there an injury with Fall?: No Fall Risk Category Calculator: 0 Patient Fall Risk Level: Low Fall Risk Patient at Risk for Falls Due to: No Fall Risks Fall risk Follow up: Falls evaluation completed  Psychosocial Psychosocial Symptoms Reported: No symptoms reported Behavioral Management Strategies: Coping strategies Behavioral Health Self-Management Outcome: 4 (good) Major Change/Loss/Stressor/Fears (CP): Denies Techniques to Cope with Loss/Stress/Change: Not applicable Quality of Family Relationships: supportive, helpful, involved Do you feel physically threatened by others?: No    09/05/2024    PHQ2-9 Depression Screening   Little interest or pleasure in doing things Not at all  Feeling down, depressed, or hopeless Not at all  PHQ-2 - Total Score 0  Trouble falling or staying asleep, or sleeping too much    Feeling tired or having little energy    Poor appetite or overeating     Feeling bad about yourself - or that you are a failure or have let yourself or your family down    Trouble concentrating on things, such as reading the newspaper or watching television    Moving or speaking so slowly that other people could have noticed.  Or the opposite - being so fidgety or restless that you have been moving around a lot more than usual    Thoughts that you would be better off dead, or hurting yourself in some way     PHQ2-9 Total Score    If  you checked off any problems, how difficult have these problems made it for you to do your work, take care of things at home, or get along with other people    Depression Interventions/Treatment      There were no vitals filed for this visit.  Medications Reviewed Today     Reviewed by Bertrum Rosina HERO, RN (Registered Nurse) on 09/05/24 at 1039  Med List Status: <None>   Medication Order Taking? Sig Documenting Provider Last Dose Status Informant  Alpha-Lipoic Acid 600 MG CAPS [id number]  Take 1 capsule (600 mg total) by mouth in the morning. For neuropathy  Patient not taking: Reported on 09/05/2024   Jolinda Potter M, DO  Consider Medication Status and Discontinue Spouse/Significant Other  amLODipine  (NORVASC ) 10 MG tablet [id number] Yes Take 1 tablet (10 mg total) by mouth daily. Per Delsie Riggs renal Jolinda Potter HERO, DO  Active   aspirin  81 MG EC tablet [id number] Yes Take 1 tablet (81 mg total) by mouth daily. Swallow whole. Kroeger, Bruno HERO., PA-C  Active Spouse/Significant Other  atorvastatin  (LIPITOR ) 80 MG tablet [id number] Yes Take 1 tablet (80 mg total) by mouth daily. Jolinda Potter M, DO  Active   carvedilol  (COREG ) 12.5 MG tablet [id number] Yes TAKE ONE TABLET TWICE DAILY WITH MEAL(S) Lavona Agent, MD  Active   cloNIDine  (CATAPRES ) 0.2 MG tablet [id number] Yes Take 1 tablet (0.2 mg total) by mouth 2 (two) times daily. Lavona Agent, MD  Active   Continuous Glucose Receiver (FREESTYLE LIBRE 3 READER) DEVI [id number]  Check BGs continuously E11.22, N18.2, Z79.4  Patient not taking: Reported on 09/05/2024   Jolinda Potter M, DO  Active   Continuous Glucose Sensor (FREESTYLE LIBRE 3 PLUS SENSOR) MISC [id number]  Check BG continuously E11.22, N18.2, Z79.4. Change sensor every 15 days.  Patient not taking: Reported on 09/05/2024   Jolinda Potter M, DO  Active   dapagliflozin  propanediol (FARXIGA ) 10 MG TABS tablet [id number] Yes Take 1  tablet (10 mg total) by mouth daily before breakfast. Jolinda Potter M, DO  Active   desloratadine  (CLARINEX ) 5 MG tablet [id number] Yes Take 1 tablet (5 mg total) by mouth every other day. For drainage/ allergies Jolinda Potter M, DO  Active   furosemide  (LASIX ) 40 MG tablet [id number] Yes Take 1 tablet (40 mg total) by mouth daily. Jolinda Potter M, DO  Active   gabapentin  (NEURONTIN ) 100 MG capsule [id number] Yes Take 2 capsules (200 mg total) by mouth 2 (two) times daily.  Patient taking differently: Take 200 mg by mouth 2 (two) times daily. 2 tablets in morning 1 tablet at night   Jolinda Potter M, DO  Active   insulin  degludec (TRESIBA  FLEXTOUCH) 100 UNIT/ML FlexTouch Pen [id number] Yes INJECT 38 UP TO 50 UNITS DAILY AS DIRECTED. Jolinda Potter M, DO  Active   Insulin  Pen Needle (PEN NEEDLES) 31G X 5 MM MISC [id number] Yes Use daily with insulin  Dx E11.9 Jolinda Potter M, DO  Active Spouse/Significant Other  isosorbide  mononitrate (IMDUR ) 60 MG 24 hr tablet [id number] Yes TAKE 1 TABLET DAILY Hochrein, Agent, MD  Active   losartan (COZAAR) 25 MG tablet [id number] Yes Take 25 mg by mouth daily. Per renal [provider]  Active Spouse/Significant Other  nitroGLYCERIN  (NITROSTAT ) 0.4 MG SL tablet [id number] Yes Place 1 tablet (0.4 mg total) under the tongue every 5 (five) minutes as needed for chest pain. Jolinda Potter M, DO  Active   pantoprazole  (PROTONIX ) 40 MG tablet [id number]  Take 1 tablet (  40 mg total) by mouth daily.  Patient not taking: Reported on 09/05/2024   Jolinda Norene HERO, DO  Expired 07/25/24 2359   spironolactone  (ALDACTONE ) 25 MG tablet [id number] Yes Take 25 mg by mouth daily. Take 1/2 tablet daily. Rachele Gaynell RAMAN, MD  Active Self            Recommendation:   Continue Current Plan of Care  Follow Up Plan:   Telephone follow-up in 1 month  Rosina Forte, BSN RN [facility], [facility] RN Care  Manager Direct Dial: [phone]  Fax: [phone]

## 2024-09-05 NOTE — Patient Instructions (Signed)
 Visit Information  Thank you for taking time to visit with me today. Please don't hesitate to contact me if I can be of assistance to you before our next scheduled appointment.  Your next care management appointment is by telephone on 10-05-2024 at 10:30 am  Telephone follow-up in 1 month  Please call the care guide team at [phone] if you need to cancel, schedule, or reschedule an appointment.   Please call the Suicide and Crisis Lifeline: 988 call the [place]  National Suicide Prevention Lifeline: 562-381-7292 or TTY: (503) 244-1508 TTY (201) 127-9955) to talk to a trained counselor call 1-800-273-TALK (toll free, 24 hour hotline) if you are experiencing a Mental Health or Behavioral Health Crisis or need someone to talk to.  Rosina Forte, BSN RN [facility], [facility] RN Care Manager Direct Dial: [phone]  Fax: [phone]

## 2024-09-06 ENCOUNTER — Other Ambulatory Visit

## 2024-09-06 DIAGNOSIS — E119 Type 2 diabetes mellitus without complications: Secondary | ICD-10-CM | POA: Diagnosis not present

## 2024-09-06 DIAGNOSIS — D649 Anemia, unspecified: Secondary | ICD-10-CM | POA: Diagnosis not present

## 2024-09-06 DIAGNOSIS — N189 Chronic kidney disease, unspecified: Secondary | ICD-10-CM | POA: Diagnosis not present

## 2024-09-06 DIAGNOSIS — R809 Proteinuria, unspecified: Secondary | ICD-10-CM | POA: Diagnosis not present

## 2024-09-06 DIAGNOSIS — D631 Anemia in chronic kidney disease: Secondary | ICD-10-CM | POA: Diagnosis not present

## 2024-09-12 DIAGNOSIS — E1122 Type 2 diabetes mellitus with diabetic chronic kidney disease: Secondary | ICD-10-CM | POA: Diagnosis not present

## 2024-09-12 DIAGNOSIS — R808 Other proteinuria: Secondary | ICD-10-CM | POA: Diagnosis not present

## 2024-09-12 DIAGNOSIS — N184 Chronic kidney disease, stage 4 (severe): Secondary | ICD-10-CM | POA: Diagnosis not present

## 2024-09-14 ENCOUNTER — Telehealth (HOSPITAL_COMMUNITY): Payer: Self-pay | Admitting: Emergency Medicine

## 2024-09-14 ENCOUNTER — Encounter (HOSPITAL_COMMUNITY): Payer: Self-pay

## 2024-09-14 NOTE — Telephone Encounter (Signed)
 Error

## 2024-09-17 ENCOUNTER — Telehealth (HOSPITAL_COMMUNITY): Payer: Self-pay | Admitting: *Deleted

## 2024-09-17 NOTE — Telephone Encounter (Signed)
 Reaching out to patient to offer assistance regarding upcoming cardiac imaging study; pt verbalizes understanding of appt date/time, parking situation and where to check in, pre-test NPO status; name and call back number provided for further questions should they arise  Chantal Requena RN Navigator Cardiac Imaging [facility] [phone] office [phone] cell  Patient aware to avoid caffeine 12 hours prior to his cardiac PET study.

## 2024-09-18 ENCOUNTER — Encounter (HOSPITAL_COMMUNITY)
Admission: RE | Admit: 2024-09-18 | Discharge: 2024-09-18 | Disposition: A | Discharge status: Home / Self Care | Source: Ambulatory Visit | Attending: Cardiology | Admitting: Cardiology

## 2024-09-18 DIAGNOSIS — R0602 Shortness of breath: Secondary | ICD-10-CM | POA: Insufficient documentation

## 2024-09-18 DIAGNOSIS — I2510 Atherosclerotic heart disease of native coronary artery without angina pectoris: Secondary | ICD-10-CM | POA: Diagnosis present

## 2024-09-18 LAB — NM PET CT CARDIAC PERFUSION MULTI W/ABSOLUTE BLOODFLOW
LV dias vol: 120 mL (ref 62–150)
LV sys vol: 45 mL (ref 4.2–5.8)
MBFR: 1.32
Nuc Rest EF: 63 %
Nuc Stress EF: 65 %
Rest MBF: 0.87 ml/g/min
Rest Nuclear Isotope Dose: 25.8 mCi
ST Depression (mm): 0 mm
Stress MBF: 1.15 ml/g/min
Stress Nuclear Isotope Dose: 25.8 mCi

## 2024-09-18 MED ORDER — RUBIDIUM RB82 GENERATOR (RUBYFILL)
25.8300 | PACK | Freq: Once | INTRAVENOUS | Status: AC
  Administered 2024-09-18: 25.83 via INTRAVENOUS

## 2024-09-18 MED ORDER — REGADENOSON 0.4 MG/5ML IV SOLN
0.4000 mg | Freq: Once | INTRAVENOUS | Status: AC
  Administered 2024-09-18: 0.4 mg via INTRAVENOUS

## 2024-09-18 MED ORDER — RUBIDIUM RB82 GENERATOR (RUBYFILL)
25.8200 | PACK | Freq: Once | INTRAVENOUS | Status: AC
  Administered 2024-09-18: 25.82 via INTRAVENOUS

## 2024-09-18 MED ORDER — REGADENOSON 0.4 MG/5ML IV SOLN
INTRAVENOUS | Status: AC
  Filled 2024-09-18: qty 5

## 2024-09-18 NOTE — Progress Notes (Signed)
Tolerated stress test well 

## 2024-10-05 ENCOUNTER — Other Ambulatory Visit: Payer: Self-pay | Admitting: *Deleted

## 2024-10-05 ENCOUNTER — Encounter: Payer: Self-pay | Admitting: *Deleted

## 2024-10-05 NOTE — Patient Instructions (Signed)
 Visit Information  Thank you for taking time to visit with me today. Please don't hesitate to contact me if I can be of assistance to you before our next scheduled appointment.  Your next care management appointment is by telephone on 11/02/24 at 10:30am  Telephone follow-up in 1 month  Please call the care guide team at [phone] if you need to cancel, schedule, or reschedule an appointment.   Please call the Suicide and Crisis Lifeline: 988 if you are experiencing a Mental Health or Behavioral Health Crisis or need someone to talk to.  Venice Liz RN RN Care Manager [facility] [phone]

## 2024-10-05 NOTE — Patient Outreach (Signed)
 Complex Care Management   Visit Note  10/05/2024  Name:  [name] MRN: [id number] DOB: [birth year]  Situation: Referral received for Complex Care Management related to Heart Failure, Diabetes with Complications, and Chronic Kidney Disease I obtained verbal consent from [name].  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   Chronic kidney disease    Complication of anesthesia    Hard to wake   Degenerative joint disease (DJD) of lumbar spine    Diabetes mellitus without complication (HCC)    Type II   Essential hypertension 02/23/2017   Lumbar stenosis    NSTEMI (non-ST elevated myocardial infarction) (HCC) 06/07/2021   Sleep apnea     Assessment: [name] Reported Symptoms:  Cognitive Cognitive Status: No symptoms reported Cognitive/Intellectual Conditions Management [RPT]: None reported or documented in medical history or problem list   Health Maintenance Behaviors: Annual physical exam Healing Pattern: Average Health Facilitated by: Rest  Neurological Neurological Review of Symptoms: No symptoms reported Neurological Management Strategies: Adequate rest Neurological Self-Management Outcome: 4 (good)  HEENT HEENT Symptoms Reported: Tearing HEENT Management Strategies: Routine screening HEENT Self-Management Outcome: 4 (good)    Cardiovascular Cardiovascular Symptoms Reported: Swelling in legs or feet Does [name] have uncontrolled Hypertension?: No Cardiovascular Management Strategies: Adequate rest Weight: 215 lb (97.5 kg) Cardiovascular Self-Management Outcome: 4 (good) Cardiovascular Comment: swelling in legs resolves at night with rest  Respiratory Respiratory Symptoms Reported: Wheezing, Shortness of breath Other Respiratory Symptoms: mild wheezing Additional Respiratory Details: dyspnea on exertion Respiratory Management  Strategies: Coping strategies Respiratory Self-Management Outcome: 4 (good)  Endocrine Endocrine Symptoms Reported: No symptoms reported Is [name] diabetic?: Yes Is [name] checking blood sugars at home?: No List most recent blood sugar readings, include date and time of day: not wearing  libre Endocrine Self-Management Outcome: 3 (uncertain)  Gastrointestinal Gastrointestinal Symptoms Reported: No symptoms reported Additional Gastrointestinal Details: takes occassional stool softener Gastrointestinal Management Strategies: Coping strategies Gastrointestinal Self-Management Outcome: 3 (uncertain) Gastrointestinal Comment: increase fiber    Genitourinary   Genitourinary Management Strategies: Medication therapy, Adequate rest Genitourinary Self-Management Outcome: 4 (good)  Integumentary Integumentary Symptoms Reported: Bruising Skin Management Strategies: Routine screening Skin Self-Management Outcome: 4 (good)  Musculoskeletal Musculoskelatal Symptoms Reviewed: No symptoms reported Musculoskeletal Management Strategies: Routine screening Musculoskeletal Self-Management Outcome: 4 (good)      Psychosocial Psychosocial Symptoms Reported: No symptoms reported Behavioral Management Strategies: Coping strategies Behavioral Health Self-Management Outcome: 4 (good)        10/05/2024    PHQ2-9 Depression Screening   Little interest or pleasure in doing things Not at all  Feeling down, depressed, or hopeless Not at all  PHQ-2 - Total Score 0  Trouble falling or staying asleep, or sleeping too much    Feeling tired or having little energy    Poor appetite or overeating     Feeling bad about yourself - or that you are a failure or have let yourself or your family down    Trouble concentrating on things, such as reading the newspaper or watching television    Moving or speaking so slowly that other people could have noticed.  Or the opposite - being so fidgety or restless that you have  been moving around a lot more than usual    Thoughts that you would be better off dead, or hurting yourself in some way    PHQ2-9 Total Score  If you checked off any problems, how difficult have these problems made it for you to do your work, take care of things at home, or get along with other people    Depression Interventions/Treatment      Today's Vitals   10/05/24 1029  Weight: 215 lb (97.5 kg)   Pain Scale: 0-10 Pain Score: 0-No pain  Medications Reviewed Today     Reviewed by Nivia, Modene Andy , RN (Registered Nurse) on 10/05/24 at 1025  Med List Status: <None>   Medication Order Taking? Sig Documenting Provider Last Dose Status Informant  Alpha-Lipoic Acid 600 MG CAPS [id number]  Take 1 capsule (600 mg total) by mouth in the morning. For neuropathy  [name] not taking: Reported on 10/05/2024   Jolinda Potter M, DO  Active Spouse/Significant Other  amLODipine  (NORVASC ) 10 MG tablet [id number] Yes Take 1 tablet (10 mg total) by mouth daily. Per Delsie Riggs renal Jolinda Potter HERO, DO  Active   aspirin  81 MG EC tablet [id number] Yes Take 1 tablet (81 mg total) by mouth daily. Swallow whole. Kroeger, Krista M., PA-C  Active Spouse/Significant Other  atorvastatin  (LIPITOR ) 80 MG tablet [id number] Yes Take 1 tablet (80 mg total) by mouth daily. Jolinda Potter M, DO  Active   carvedilol  (COREG ) 12.5 MG tablet [id number] Yes TAKE ONE TABLET TWICE DAILY WITH MEAL(S) Lavona Agent, MD  Active   cloNIDine  (CATAPRES ) 0.2 MG tablet [id number] Yes Take 1 tablet (0.2 mg total) by mouth 2 (two) times daily. Lavona Agent, MD  Active   Continuous Glucose Receiver (FREESTYLE LIBRE 3 READER) DEVI [id number] Yes Check BGs continuously E11.22, N18.2, Z79.4 Jolinda Potter M, DO  Active   Continuous Glucose Sensor (FREESTYLE LIBRE 3 PLUS SENSOR) MISC [id number] Yes Check BG continuously E11.22, N18.2, Z79.4. Change sensor every 15 days. Jolinda Potter M, DO  Active   dapagliflozin   propanediol (FARXIGA ) 10 MG TABS tablet [id number] Yes Take 1 tablet (10 mg total) by mouth daily before breakfast. Jolinda Potter M, DO  Active   desloratadine  (CLARINEX ) 5 MG tablet [id number]  Take 1 tablet (5 mg total) by mouth every other day. For drainage/ allergies  [name] not taking: Reported on 10/05/2024   Jolinda Potter M, DO  Active   furosemide  (LASIX ) 40 MG tablet [id number] Yes Take 1 tablet (40 mg total) by mouth daily. Jolinda Potter M, DO  Active   gabapentin  (NEURONTIN ) 100 MG capsule [id number] Yes Take 2 capsules (200 mg total) by mouth 2 (two) times daily. Jolinda Potter M, DO  Active   insulin  degludec (TRESIBA  FLEXTOUCH) 100 UNIT/ML FlexTouch Pen [id number] Yes INJECT 38 UP TO 50 UNITS DAILY AS DIRECTED. Jolinda Potter M, DO  Active   Insulin  Pen Needle (PEN NEEDLES) 31G X 5 MM MISC [id number] Yes Use daily with insulin  Dx E11.9 Jolinda Potter M, DO  Active Spouse/Significant Other  isosorbide  mononitrate (IMDUR ) 60 MG 24 hr tablet [id number] Yes TAKE 1 TABLET DAILY Hochrein, Agent, MD  Active   losartan (COZAAR) 25 MG tablet [id number] Yes Take 25 mg by mouth daily. Per renal [provider]  Active Spouse/Significant Other  nitroGLYCERIN  (NITROSTAT ) 0.4 MG SL tablet [id number] Yes Place 1 tablet (0.4 mg total) under the tongue every 5 (five) minutes as needed for chest pain. Jolinda Potter M, DO  Active   pantoprazole  (PROTONIX ) 40 MG tablet [id number]  Take 1 tablet (40 mg total) by mouth daily.  [name] not taking: Reported on 10/05/2024   Jolinda Potter HERO, DO  Expired 07/25/24 2359  spironolactone  (ALDACTONE ) 25 MG tablet [id number] Yes Take 25 mg by mouth daily. Take 1/2 tablet daily. Rachele Gaynell RAMAN, MD  Active Self            Recommendation:   Continue Current Plan of Care  Follow Up Plan:   Telephone follow-up in 1 month  Zeb Rawl RN RN Care Manager [facility] [phone]

## 2024-10-30 ENCOUNTER — Emergency Department (HOSPITAL_COMMUNITY)

## 2024-10-30 ENCOUNTER — Encounter (HOSPITAL_COMMUNITY): Payer: Self-pay

## 2024-10-30 ENCOUNTER — Observation Stay (HOSPITAL_COMMUNITY)
Admission: EM | Admit: 2024-10-30 | Discharge: 2024-10-31 | Disposition: A | Discharge status: Home Health | Attending: Family Medicine | Admitting: Family Medicine

## 2024-10-30 ENCOUNTER — Other Ambulatory Visit: Payer: Self-pay

## 2024-10-30 DIAGNOSIS — R918 Other nonspecific abnormal finding of lung field: Secondary | ICD-10-CM | POA: Diagnosis not present

## 2024-10-30 DIAGNOSIS — Z79899 Other long term (current) drug therapy: Secondary | ICD-10-CM | POA: Insufficient documentation

## 2024-10-30 DIAGNOSIS — R112 Nausea with vomiting, unspecified: Secondary | ICD-10-CM | POA: Diagnosis not present

## 2024-10-30 DIAGNOSIS — E785 Hyperlipidemia, unspecified: Secondary | ICD-10-CM | POA: Diagnosis not present

## 2024-10-30 DIAGNOSIS — Z7982 Long term (current) use of aspirin: Secondary | ICD-10-CM | POA: Diagnosis not present

## 2024-10-30 DIAGNOSIS — R531 Weakness: Secondary | ICD-10-CM | POA: Diagnosis not present

## 2024-10-30 DIAGNOSIS — R42 Dizziness and giddiness: Secondary | ICD-10-CM | POA: Insufficient documentation

## 2024-10-30 DIAGNOSIS — I130 Hypertensive heart and chronic kidney disease with heart failure and stage 1 through stage 4 chronic kidney disease, or unspecified chronic kidney disease: Secondary | ICD-10-CM | POA: Insufficient documentation

## 2024-10-30 DIAGNOSIS — E1140 Type 2 diabetes mellitus with diabetic neuropathy, unspecified: Secondary | ICD-10-CM

## 2024-10-30 DIAGNOSIS — F1090 Alcohol use, unspecified, uncomplicated: Secondary | ICD-10-CM | POA: Insufficient documentation

## 2024-10-30 DIAGNOSIS — K529 Noninfective gastroenteritis and colitis, unspecified: Secondary | ICD-10-CM | POA: Diagnosis not present

## 2024-10-30 DIAGNOSIS — N184 Chronic kidney disease, stage 4 (severe): Secondary | ICD-10-CM | POA: Insufficient documentation

## 2024-10-30 DIAGNOSIS — D72829 Elevated white blood cell count, unspecified: Secondary | ICD-10-CM | POA: Insufficient documentation

## 2024-10-30 DIAGNOSIS — I5032 Chronic diastolic (congestive) heart failure: Secondary | ICD-10-CM | POA: Insufficient documentation

## 2024-10-30 DIAGNOSIS — Z1152 Encounter for screening for COVID-19: Secondary | ICD-10-CM | POA: Insufficient documentation

## 2024-10-30 DIAGNOSIS — E1151 Type 2 diabetes mellitus with diabetic peripheral angiopathy without gangrene: Secondary | ICD-10-CM

## 2024-10-30 DIAGNOSIS — J205 Acute bronchitis due to respiratory syncytial virus: Secondary | ICD-10-CM | POA: Diagnosis not present

## 2024-10-30 DIAGNOSIS — I2510 Atherosclerotic heart disease of native coronary artery without angina pectoris: Secondary | ICD-10-CM | POA: Insufficient documentation

## 2024-10-30 DIAGNOSIS — Z794 Long term (current) use of insulin: Secondary | ICD-10-CM | POA: Diagnosis not present

## 2024-10-30 DIAGNOSIS — E119 Type 2 diabetes mellitus without complications: Secondary | ICD-10-CM | POA: Diagnosis not present

## 2024-10-30 LAB — CBC
HCT: 35.8 % — ABNORMAL LOW (ref 39.0–52.0)
Hemoglobin: 12.2 g/dL — ABNORMAL LOW (ref 13.0–17.0)
MCH: 31.3 pg (ref 26.0–34.0)
MCHC: 34.1 g/dL (ref 30.0–36.0)
MCV: 91.8 fL (ref 80.0–100.0)
Platelets: 211 K/uL (ref 150–400)
RBC: 3.9 MIL/uL — ABNORMAL LOW (ref 4.22–5.81)
RDW: 13.2 % (ref 11.5–15.5)
WBC: 17.7 K/uL — ABNORMAL HIGH (ref 4.0–10.5)
nRBC: 0 % (ref 0.0–0.2)

## 2024-10-30 LAB — CBG MONITORING, ED
Glucose-Capillary: 158 mg/dL — ABNORMAL HIGH (ref 70–99)
Glucose-Capillary: 151 mg/dL — ABNORMAL HIGH (ref 70–99)
Glucose-Capillary: 119 mg/dL — ABNORMAL HIGH (ref 70–99)

## 2024-10-30 LAB — COMPREHENSIVE METABOLIC PANEL WITH GFR
ALT: 15 U/L (ref 0–44)
AST: 23 U/L (ref 15–41)
Albumin: 4.6 g/dL (ref 3.5–5.0)
Alkaline Phosphatase: 89 U/L (ref 38–126)
Anion gap: 14 (ref 5–15)
BUN: 52 mg/dL — ABNORMAL HIGH (ref 8–23)
CO2: 22 mmol/L (ref 22–32)
Calcium: 9.9 mg/dL (ref 8.9–10.3)
Chloride: 103 mmol/L (ref 98–111)
Creatinine, Ser: 2.69 mg/dL — ABNORMAL HIGH (ref 0.61–1.24)
GFR, Estimated: 25 mL/min — ABNORMAL LOW (ref >60)
Glucose, Bld: 158 mg/dL — ABNORMAL HIGH (ref 70–99)
Potassium: 4.8 mmol/L (ref 3.5–5.1)
Sodium: 139 mmol/L (ref 135–145)
Total Bilirubin: 0.7 mg/dL (ref 0.0–1.2)
Total Protein: 7.6 g/dL (ref 6.5–8.1)

## 2024-10-30 LAB — RESP PANEL BY RT-PCR (RSV, FLU A&B, COVID)  RVPGX2
Influenza A by PCR: NEGATIVE
Influenza B by PCR: NEGATIVE
Resp Syncytial Virus by PCR: POSITIVE — AB
SARS Coronavirus 2 by RT PCR: NEGATIVE

## 2024-10-30 LAB — LIPASE, BLOOD: Lipase: 124 U/L — ABNORMAL HIGH (ref 11–51)

## 2024-10-30 LAB — TROPONIN T, HIGH SENSITIVITY: Troponin T High Sensitivity: 46 ng/L — ABNORMAL HIGH (ref 0–19)

## 2024-10-30 MED ORDER — INSULIN GLARGINE 100 UNIT/ML ~~LOC~~ SOLN
25.0000 [IU] | Freq: Every day | SUBCUTANEOUS | Status: DC
  Filled 2024-10-30 (×2): qty 0.25

## 2024-10-30 MED ORDER — DAPAGLIFLOZIN PROPANEDIOL 10 MG PO TABS: 10.0000 mg | ORAL_TABLET | Freq: Every day | ORAL | Status: DC

## 2024-10-30 MED ORDER — GABAPENTIN 100 MG PO CAPS
200.0000 mg | ORAL_CAPSULE | Freq: Two times a day (BID) | ORAL | Status: DC
  Administered 2024-10-30 – 2024-10-31 (×2): 200 mg via ORAL
  Filled 2024-10-30 (×2): qty 2

## 2024-10-30 MED ORDER — LACTATED RINGERS IV SOLN
INTRAVENOUS | Status: DC
  Administered 2024-10-30: 21:00:00 100 mL/h via INTRAVENOUS

## 2024-10-30 MED ORDER — ASPIRIN 81 MG PO TBEC
81.0000 mg | DELAYED_RELEASE_TABLET | Freq: Every day | ORAL | Status: DC
  Administered 2024-10-31: 11:00:00 81 mg via ORAL
  Filled 2024-10-30: qty 1

## 2024-10-30 MED ORDER — HEPARIN SODIUM (PORCINE) 5000 UNIT/ML IJ SOLN
5000.0000 [IU] | Freq: Three times a day (TID) | INTRAMUSCULAR | Status: DC
  Administered 2024-10-30 – 2024-10-31 (×2): 5000 [IU] via SUBCUTANEOUS
  Filled 2024-10-30 (×3): qty 1

## 2024-10-30 MED ORDER — NITROGLYCERIN 0.4 MG SL SUBL: 0.4000 mg | SUBLINGUAL_TABLET | SUBLINGUAL | Status: DC | PRN

## 2024-10-30 MED ORDER — ONDANSETRON 4 MG PO TBDP
4.0000 mg | ORAL_TABLET | Freq: Once | ORAL | Status: AC | PRN
  Administered 2024-10-30: 13:00:00 4 mg via ORAL
  Filled 2024-10-30: qty 1

## 2024-10-30 MED ORDER — ISOSORBIDE MONONITRATE ER 60 MG PO TB24
60.0000 mg | ORAL_TABLET | Freq: Every day | ORAL | Status: DC
  Administered 2024-10-31: 11:00:00 60 mg via ORAL
  Filled 2024-10-30: qty 2

## 2024-10-30 MED ORDER — ACETAMINOPHEN 650 MG RE SUPP: 650.0000 mg | Freq: Four times a day (QID) | RECTAL | Status: DC | PRN

## 2024-10-30 MED ORDER — CLONIDINE HCL 0.2 MG PO TABS
0.2000 mg | ORAL_TABLET | Freq: Two times a day (BID) | ORAL | Status: DC
  Administered 2024-10-30 – 2024-10-31 (×2): 0.2 mg via ORAL
  Filled 2024-10-30 (×2): qty 1

## 2024-10-30 MED ORDER — SODIUM CHLORIDE 0.9 % IV BOLUS
500.0000 mL | Freq: Once | INTRAVENOUS | Status: AC
  Administered 2024-10-30: 18:00:00 500 mL via INTRAVENOUS

## 2024-10-30 MED ORDER — CARVEDILOL 12.5 MG PO TABS
12.5000 mg | ORAL_TABLET | Freq: Two times a day (BID) | ORAL | Status: DC
  Administered 2024-10-31 (×2): 12.5 mg via ORAL
  Filled 2024-10-30 (×2): qty 1

## 2024-10-30 MED ORDER — ONDANSETRON HCL 4 MG/2ML IJ SOLN: 4.0000 mg | Freq: Four times a day (QID) | INTRAMUSCULAR | Status: DC | PRN

## 2024-10-30 MED ORDER — INSULIN ASPART 100 UNIT/ML IJ SOLN: 0.0000 [IU] | Freq: Every day | INTRAMUSCULAR | Status: DC

## 2024-10-30 MED ORDER — ACETAMINOPHEN 325 MG PO TABS: 650.0000 mg | ORAL_TABLET | Freq: Four times a day (QID) | ORAL | Status: DC | PRN

## 2024-10-30 MED ORDER — INSULIN ASPART 100 UNIT/ML IJ SOLN
0.0000 [IU] | Freq: Three times a day (TID) | INTRAMUSCULAR | Status: DC
  Administered 2024-10-31: 17:00:00 5 [IU] via SUBCUTANEOUS
  Administered 2024-10-31: 14:00:00 2 [IU] via SUBCUTANEOUS
  Filled 2024-10-30: qty 5
  Filled 2024-10-30: qty 2

## 2024-10-30 MED ORDER — ONDANSETRON HCL 4 MG PO TABS: 4.0000 mg | ORAL_TABLET | Freq: Four times a day (QID) | ORAL | Status: DC | PRN

## 2024-10-30 MED ORDER — SPIRONOLACTONE 12.5 MG HALF TABLET
12.5000 mg | ORAL_TABLET | Freq: Every day | ORAL | Status: DC
  Administered 2024-10-31: 11:00:00 12.5 mg via ORAL
  Filled 2024-10-30: qty 1

## 2024-10-30 MED ORDER — ATORVASTATIN CALCIUM 80 MG PO TABS
80.0000 mg | ORAL_TABLET | Freq: Every day | ORAL | Status: DC
  Administered 2024-10-31: 11:00:00 80 mg via ORAL
  Filled 2024-10-30: qty 2

## 2024-10-30 NOTE — ED Provider Notes (Signed)
 [facility] [facility] AT [facility] Provider Note   CSN: [id number] Arrival date & time: 10/30/24  1058     Patient presents with: Emesis   [name] is a 68 y.o. male.  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  According to the son who is providing most of the history, they ate some sausage yesterday, along with some fried chicken from the deli, reports that his father was up all night vomiting, complaining to his wife about not feeling well.  He has not been able to walk since yesterday.  According to his son they had to get assistance in order to pick him up and put him onto the truck.  He does have an underlying history of ESRD, currently not on dialysis but does have also underlying cardiac history.  Son reports having similar symptoms with nausea vomiting and diarrhea the same as his father.  He does report that he did have some alcohol last night, but patient was ambulatory dead. Patient denies any chest pain, shortness of breath or any complaints.  States I just dont feel good   The history is provided by the patient.  Emesis Severity:  Moderate Duration:  1 day Timing:  Constant Associated symptoms: no abdominal pain, no chills, no fever, no headaches and no sore throat        Prior to Admission medications  Medication Sig Start Date End Date Taking? Authorizing Provider  Alpha-Lipoic Acid 600 MG CAPS Take 1 capsule (600 mg total) by mouth in the morning. For neuropathy Patient not taking: Reported on 10/05/2024 05/03/23   Jolinda Norene HERO, DO  amLODipine  (NORVASC ) 10 MG tablet Take 1 tablet (10 mg total) by mouth daily. Per Delsie Riggs renal 01/06/24   Jolinda Norene HERO, DO  aspirin   81 MG EC tablet Take 1 tablet (81 mg total) by mouth daily. Swallow whole. 06/13/21   Kroeger, Bruno HERO., PA-C  atorvastatin  (LIPITOR ) 80 MG tablet Take 1 tablet (80 mg total) by mouth daily. 01/06/24   Jolinda Norene M, DO  carvedilol  (COREG ) 12.5 MG tablet TAKE ONE TABLET TWICE DAILY WITH MEAL(S) 07/10/24   Lynwood Schilling, MD  cloNIDine  (CATAPRES ) 0.2 MG tablet Take 1 tablet (0.2 mg total) by mouth 2 (two) times daily. 06/20/24   Lynwood Schilling, MD  Continuous Glucose Receiver (FREESTYLE LIBRE 3 READER) DEVI Check BGs continuously E11.22, N18.2, Z79.4 01/06/24   Jolinda Norene M, DO  Continuous Glucose Sensor (FREESTYLE LIBRE 3 PLUS SENSOR) MISC Check BG continuously E11.22, N18.2, Z79.4. Change sensor every 15 days. 01/06/24   Jolinda Norene HERO, DO  dapagliflozin  propanediol (FARXIGA ) 10 MG TABS tablet Take 1 tablet (10 mg total) by mouth daily before breakfast. 10/28/23   Jolinda Norene M, DO  desloratadine  (CLARINEX ) 5 MG tablet Take 1 tablet (5 mg total) by mouth every other day. For drainage/ allergies Patient not taking: Reported on 10/05/2024 07/25/24   Jolinda Norene HERO, DO  furosemide  (LASIX ) 40 MG tablet Take 1 tablet (40 mg total) by mouth daily. 01/06/24   Jolinda Norene HERO, DO  gabapentin  (NEURONTIN ) 100 MG capsule Take 2 capsules (200 mg total) by mouth 2 (two) times daily. 07/25/24   Jolinda Norene HERO, DO  insulin  degludec (TRESIBA  FLEXTOUCH) 100  UNIT/ML FlexTouch Pen INJECT 38 UP TO 50 UNITS DAILY AS DIRECTED. 01/06/24   Jolinda Potter M, DO  Insulin  Pen Needle (PEN NEEDLES) 31G X 5 MM MISC Use daily with insulin  Dx E11.9 12/09/20   Jolinda Potter M, DO  isosorbide  mononitrate (IMDUR ) 60 MG 24 hr tablet TAKE 1 TABLET DAILY 07/06/24   Lynwood Schilling, MD  losartan (COZAAR) 25 MG tablet Take 25 mg by mouth daily. Per renal 10/25/22   [provider]  nitroGLYCERIN  (NITROSTAT ) 0.4 MG SL tablet Place 1 tablet (0.4 mg total) under the tongue every 5 (five) minutes as  needed for chest pain. 09/05/23 10/05/24  Jolinda Potter HERO, DO  pantoprazole  (PROTONIX ) 40 MG tablet Take 1 tablet (40 mg total) by mouth daily. Patient not taking: Reported on 10/05/2024 01/06/24 07/25/24  Jolinda Potter HERO, DO  spironolactone  (ALDACTONE ) 25 MG tablet Take 25 mg by mouth daily. Take 1/2 tablet daily.    Rachele Gaynell RAMAN, MD    Allergies: Patient has no known allergies.    Review of Systems  Constitutional:  Negative for chills and fever.  HENT:  Negative for sore throat.   Respiratory:  Negative for shortness of breath.   Cardiovascular:  Negative for chest pain.  Gastrointestinal:  Positive for nausea and vomiting. Negative for abdominal pain.  Genitourinary:  Negative for flank pain.  Musculoskeletal:  Negative for back pain.  Skin:  Negative for pallor and wound.  Neurological:  Negative for light-headedness and headaches.  All other systems reviewed and are negative.   Updated Vital Signs BP (!) 155/70   Pulse 75   Temp 97.7 F (36.5 C) (Oral)   Resp 10   Ht 6' (1.829 m)   Wt 97.5 kg   SpO2 97%   BMI 29.15 kg/m   Physical Exam Vitals and nursing note reviewed.  Constitutional:      Appearance: Normal appearance.  HENT:     Head: Normocephalic and atraumatic.     Nose: Nose normal.     Mouth/Throat:     Mouth: Mucous membranes are moist.  Cardiovascular:     Rate and Rhythm: Normal rate.  Pulmonary:     Effort: Pulmonary effort is normal.  Abdominal:     General: Abdomen is flat.     Tenderness: There is no right CVA tenderness or left CVA tenderness.  Musculoskeletal:     Cervical back: Normal range of motion and neck supple.  Skin:    General: Skin is warm and dry.  Neurological:     Mental Status: He is alert and oriented to person, place, and time.     (all labs ordered are listed, but only abnormal results are displayed) Labs Reviewed  RESP PANEL BY RT-PCR (RSV, FLU A&B, COVID)  RVPGX2 - Abnormal; Notable for the following  components:      Result Value   Resp Syncytial Virus by PCR POSITIVE (*)    All other components within normal limits  LIPASE, BLOOD - Abnormal; Notable for the following components:   Lipase 124 (*)    All other components within normal limits  COMPREHENSIVE METABOLIC PANEL WITH GFR - Abnormal; Notable for the following components:   Glucose, Bld 158 (*)    BUN 52 (*)    Creatinine, Ser 2.69 (*)    GFR, Estimated 25 (*)    All other components within normal limits  CBC - Abnormal; Notable for the following components:   WBC 17.7 (*)    RBC 3.90 (*)  Hemoglobin 12.2 (*)    HCT 35.8 (*)    All other components within normal limits  CBG MONITORING, ED - Abnormal; Notable for the following components:   Glucose-Capillary 158 (*)    All other components within normal limits  CBG MONITORING, ED - Abnormal; Notable for the following components:   Glucose-Capillary 151 (*)    All other components within normal limits  TROPONIN T, HIGH SENSITIVITY - Abnormal; Notable for the following components:   Troponin T High Sensitivity 46 (*)    All other components within normal limits  GASTROINTESTINAL PANEL BY PCR, STOOL (REPLACES STOOL CULTURE)  URINALYSIS, ROUTINE W REFLEX MICROSCOPIC  URINE DRUG SCREEN  HIV ANTIBODY (ROUTINE TESTING W REFLEX)  CBC  CREATININE, SERUM  COMPREHENSIVE METABOLIC PANEL WITH GFR  CBC  HEMOGLOBIN A1C    EKG: EKG Interpretation Date/Time:  Tuesday October 30 2024 12:38:16 EST Ventricular Rate:  64 PR Interval:  224 QRS Duration:  86 QT Interval:  440 QTC Calculation: 453 R Axis:   -7  Text Interpretation: Sinus rhythm with 1st degree A-V block Otherwise normal ECG When compared with ECG of 22-Mar-2024 15:29, PREVIOUS ECG IS PRESENT Confirmed by Yolande Charleston [phone]) on 10/30/2024 5:35:00 PM  Radiology: CT CHEST ABDOMEN PELVIS WO CONTRAST Result Date: 10/30/2024 EXAM: CT CHEST, ABDOMEN AND PELVIS WITHOUT CONTRAST 10/30/2024 06:40:00 PM  TECHNIQUE: CT of the chest, abdomen and pelvis was performed without the administration of intravenous contrast. Multiplanar reformatted images are provided for review. Automated exposure control, iterative reconstruction, and/or weight based adjustment of the mA/kV was utilized to reduce the radiation dose to as low as reasonably achievable. COMPARISON: None available. CLINICAL HISTORY: Sepsis. FINDINGS: CHEST: MEDIASTINUM AND LYMPH NODES: Heart and pericardium are unremarkable. Coronary and aortic atherosclerotic calcifications are noted. The central airways are clear. No mediastinal, hilar or axillary lymphadenopathy. LUNGS AND PLEURA: No focal consolidation or pulmonary edema. No pleural effusion or pneumothorax. ABDOMEN AND PELVIS: LIVER: The liver is unremarkable. GALLBLADDER AND BILE DUCTS: Small gallstones are present. No biliary ductal dilatation. SPLEEN: The spleen is mildly enlarged. PANCREAS: No acute abnormality. ADRENAL GLANDS: No acute abnormality. KIDNEYS, URETERS AND BLADDER: There are few punctate calculi in both kidneys. No stones in the ureters. No hydronephrosis. There is mild nonspecific bilateral perinephric fat stranding. The bladder is distended. GI AND BOWEL: Stomach demonstrates no acute abnormality. There is diffuse colonic diverticulosis. The appendix appears normal. There is no bowel obstruction. REPRODUCTIVE ORGANS: No acute abnormality. PERITONEUM AND RETROPERITONEUM: No ascites. No free air. VASCULATURE: Aorta is normal in caliber. ABDOMINAL AND PELVIS LYMPH NODES: There are mildly enlarged right inguinal lymph nodes measuring up to 12 mm. BONES AND SOFT TISSUES: L4-S1 posterior fusion hardware present. No acute osseous abnormality. No focal soft tissue abnormality. IMPRESSION: 1. No acute findings in the chest, abdomen, or pelvis. 2. Cholelithiasis. 3. Mild splenomegaly. 4. Punctate bilateral nephrolithiasis. 5. Diffuse colonic diverticulosis. 6. Mildly enlarged right inguinal lymph  nodes measuring up to 12 mm, nonspecific. 7. Distended urinary bladder. 8. Coronary and aortic atherosclerotic calcifications. Electronically signed by: Greig Pique MD 10/30/2024 06:55 PM EST RP Workstation: [id number]   CT Head Wo Contrast Result Date: 10/30/2024 EXAM: CT HEAD WITHOUT CONTRAST 10/30/2024 06:40:00 PM TECHNIQUE: CT of the head was performed without the administration of intravenous contrast. Automated exposure control, iterative reconstruction, and/or weight based adjustment of the mA/kV was utilized to reduce the radiation dose to as low as reasonably achievable. COMPARISON: None available. CLINICAL HISTORY: Delirium FINDINGS: BRAIN AND VENTRICLES: No  acute hemorrhage. No evidence of acute infarct. No hydrocephalus. No extra-axial collection. No mass effect or midline shift. Atherosclerotic calcifications are present within the cavernous internal carotid arteries. ORBITS: No acute abnormality. SINUSES: Mucosal thickening in maxillary sinuses. SOFT TISSUES AND SKULL: No acute soft tissue abnormality. No skull fracture. IMPRESSION: 1. No acute intracranial abnormality. Electronically signed by: Morgane Naveau MD 10/30/2024 06:52 PM EST RP Workstation: [id number]   DG Chest 2 View Result Date: 10/30/2024 CLINICAL DATA:  Nausea, vomiting EXAM: CHEST - 2 VIEW COMPARISON:  July 19, 2021 FINDINGS: The heart size and mediastinal contours are within normal limits. Right lung is clear. Minimal left basilar subsegmental atelectasis or scarring is noted. The visualized skeletal structures are unremarkable. IMPRESSION: Minimal left basilar subsegmental atelectasis or scarring. Electronically Signed   By: Lynwood Landy Raddle M.D.   On: 10/30/2024 14:42    {Document cardiac monitor, telemetry assessment procedure when appropriate:32947} Procedures   Medications Ordered in the ED  lactated ringers  infusion (100 mL/hr Intravenous New Bag/Given 10/30/24 2117)  aspirin  EC tablet 81 mg (has no  administration in time range)  atorvastatin  (LIPITOR ) tablet 80 mg (has no administration in time range)  carvedilol  (COREG ) tablet 12.5 mg (has no administration in time range)  cloNIDine  (CATAPRES ) tablet 0.2 mg (has no administration in time range)  isosorbide  mononitrate (IMDUR ) 24 hr tablet 60 mg (has no administration in time range)  nitroGLYCERIN  (NITROSTAT ) SL tablet 0.4 mg (has no administration in time range)  spironolactone  (ALDACTONE ) tablet 12.5 mg (has no administration in time range)  dapagliflozin  propanediol (FARXIGA ) tablet 10 mg (has no administration in time range)  gabapentin  (NEURONTIN ) capsule 200 mg (has no administration in time range)  heparin  injection 5,000 Units (has no administration in time range)  acetaminophen  (TYLENOL ) tablet 650 mg (has no administration in time range)    Or  acetaminophen  (TYLENOL ) suppository 650 mg (has no administration in time range)  ondansetron  (ZOFRAN ) tablet 4 mg (has no administration in time range)    Or  ondansetron  (ZOFRAN ) injection 4 mg (has no administration in time range)  insulin  aspart (novoLOG ) injection 0-15 Units (has no administration in time range)  insulin  aspart (novoLOG ) injection 0-5 Units (has no administration in time range)  insulin  glargine (LANTUS ) Solostar Pen 25 Units (has no administration in time range)  ondansetron  (ZOFRAN -ODT) disintegrating tablet 4 mg (4 mg Oral Given 10/30/24 1301)  sodium chloride  0.9 % bolus 500 mL (0 mLs Intravenous Stopped 10/30/24 1842)    Clinical Course as of 10/30/24 2205  Tue Oct 30, 2024  1658 WBC(!): 17.7 [JS]  1658 Lipase(!): 124 [JS]  1658 Respiratory Syncytial Virus by PCR(!): POSITIVE [JS]  1658 Troponin T High Sensitivity(!): 46 [JS]    Clinical Course User Index [JS] Jakyah Bradby, PA-C   {Click here for ABCD2, HEART and other calculators REFRESH Note before signing:1}                              Medical Decision Making Amount and/or Complexity of Data  Reviewed Labs: ordered. Decision-making details documented in ED Course. Radiology: ordered.  Risk Prescription drug management. Decision regarding hospitalization.   This patient presents to the ED for concern of emesis, this involves a number of treatment options, and is a complaint that carries with it a high risk of complications and morbidity.  The differential diagnosis includes viral illness, obstruction versus sepsis.    Co morbidities: Discussed in HPI  Brief History:  See HPI.   EMR reviewed including pt PMHx, past surgical history and past visits to ER.   See HPI for more details   Lab Tests:  I ordered and independently interpreted labs.  The pertinent results include:    CBC with a leukocytosis of 17.7, hemoglobin at his baseline. CMP with no electrolyte derangement. Creatine at his baseline, not currently on dialysis. Lfts are within normal limits. No anion gap. Lipase level slightly elevated abdomen is distended but does not report pain. RSV +   Imaging Studies:  CT Chest Abdomen and pelvis w/o contrast showed: IMPRESSION:  1. No acute findings in the chest, abdomen, or pelvis.  2. Cholelithiasis.  3. Mild splenomegaly.  4. Punctate bilateral nephrolithiasis.  5. Diffuse colonic diverticulosis.  6. Mildly enlarged right inguinal lymph nodes measuring up to 12 mm,  nonspecific.  7. Distended urinary bladder.  8. Coronary and aortic atherosclerotic calcifications.   CT head with no acute intracranial pathology.  Xray of the chest without any acute findings.   Cardiac Monitoring:  The patient was maintained on a cardiac monitor.  I personally viewed and interpreted the cardiac monitored which showed an underlying rhythm of: NSR 67 EKG non-ischemic  Medicines ordered:  I ordered medication including bolus, zofran   for symptomatic treatment  Reevaluation of the patient after these medicines showed that the patient improved I have reviewed the  patients home medicines and have made adjustments as needed  Critical Interventions:  ***  Consults:  I requested consultation with ***,  and discussed lab and imaging findings as well as pertinent plan - they recommend: ***   Reevaluation:  After the interventions noted above I re-evaluated patient and found that they have :{resolved/improved/worsened:23923::improved}   Social Determinants of Health:  The patient's social determinants of health were a factor in the care of this patient   Problem List / ED Course:  ***   Dispostion:  After consideration of the diagnostic results and the patients response to treatment, I feel that the patent would benefit from ***    Portions of this note were generated with Dragon dictation software. Dictation errors may occur despite best attempts at proofreading.   Final diagnoses:  DM (diabetes mellitus) type II, controlled, with peripheral vascular disorder (HCC)  Neuropathy due to type 2 diabetes mellitus [facility])    ED Discharge Orders     None

## 2024-10-30 NOTE — ED Notes (Signed)
 Patient transported to CT

## 2024-10-30 NOTE — ED Triage Notes (Signed)
 Son had medics come assess patient and due to patient having n/v since this morning. Medics wanted to send to [facility] but son refused.  Son has checked in as well with n/v/d.

## 2024-10-30 NOTE — ED Provider Triage Note (Signed)
 Emergency Medicine Provider Triage Evaluation Note  Markis Langland , a 68 y.o. male  was evaluated in triage.  Pt complains of nausea/vomiting.  Patient is companied by his son who provides collateral information, states his dad is not acting himself, he began to have nausea/vomiting yesterday, denies diarrhea or fever at home.  Denies abdominal pain.  Review of Systems  Positive: As above Negative: As above  Physical Exam  BP (!) 154/69   Pulse 73   Temp (!) 97.5 F (36.4 C) (Oral)   Resp 18   Ht 6' (1.829 m)   Wt 97.5 kg   SpO2 95%   BMI 29.15 kg/m  Gen:   Lethargic but responsive  Resp:  Normal effort  MSK:   Moves extremities without difficulty  Other:  Abdomen is soft and nontender to palpation  Medical Decision Making  Medically screening exam initiated at 12:45 PM.  Appropriate orders placed.  Chi Garlow was informed that the remainder of the evaluation will be completed by another provider, this initial triage assessment does not replace that evaluation, and the importance of remaining in the ED until their evaluation is complete.     Glendia Rocky SAILOR, [place] 10/30/24 1246

## 2024-10-30 NOTE — H&P (Signed)
 History and Physical    [name] FMW:[id number] DOB: [birth year] DOA: 10/30/2024  PCP: Jolinda Norene HERO, DO  Patient coming from: Home  I have personally briefly reviewed patient's old medical records in [facility]  Chief Complaint: Nausea/vomiting/diarrhea/weakness  HPI: [name] is a 68 y.o. male with medical history significant of hypertension, hyperlipidemia, type 2 diabetes mellitus, CAD, CKD stage IV, diastolic congestive heart failure was brought into the emergency department due to nausea, vomiting, diarrhea and generalized weakness which started last night.  According the history which was obtained from the ED physician and the son, patient and her son both 8 fried chicken and sausage from a gas station.  Patient started having nausea vomiting and could not sleep all night long due to that.  Son also is having similar symptoms.  Patient's symptoms continued throughout today causing significant weakness so family decided to bring him to the emergency department.  Patient has no other complaint such as chest pain, shortness of breath, dizziness, palpitation, fever, chills or sweating or any problem with urination.  Patient's family was not present at the bedside when I visited the patient.  ED Course: Upon arrival to the ED, he is afebrile, mostly hemodynamically stable, blood pressure slightly elevated.  CT chest abdomen pelvis shows diverticulosis and cholelithiasis but no acute pathology.  Chest x-ray and CT head unremarkable.  CBC shows leukocytosis and BMP shows creatinine of 2.9 which is close to his baseline.  He was tested positive for RSV.  Lipase minimally elevated which is nonspecific.  Patient received Zofran  and 500 cc fluid bolus in the ED.  Review of Systems: As per HPI otherwise negative.    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   Chronic kidney disease    Complication  of anesthesia    Hard to wake   Degenerative joint disease (DJD) of lumbar spine    Diabetes mellitus without complication (HCC)    Type II   Essential hypertension 02/23/2017   Lumbar stenosis    NSTEMI (non-ST elevated myocardial infarction) (HCC) 06/07/2021   Sleep apnea     Past Surgical History:  Procedure Laterality Date   AMPUTATION TOE Right 05/17/2020   Procedure: AMPUTATION TOE partial right great toe;  Surgeon: Gretel Ozell PARAS, DPM;  Location: [facility] ORS;  Service: Podiatry;  Laterality: Right;   BELPHAROPTOSIS REPAIR     CARDIAC CATHETERIZATION     CORONARY STENT INTERVENTION N/A 06/11/2021   Procedure: CORONARY STENT INTERVENTION;  Surgeon: Jordan, Peter M, MD;  Location: [facility] INVASIVE CV LAB;  Service: Cardiovascular;  Laterality: N/A;   CORONARY ULTRASOUND/IVUS N/A 06/11/2021   Procedure: Intravascular Ultrasound/IVUS;  Surgeon: Jordan, Peter M, MD;  Location: [facility] INVASIVE CV LAB;  Service: Cardiovascular;  Laterality: N/A;   EYE SURGERY     FLEXIBLE SIGMOIDOSCOPY N/A 05/18/2023   Procedure: FLEXIBLE SIGMOIDOSCOPY;  Surgeon: Shaaron Lamar HERO, MD;  Location: [facility]ITE;  Service: Endoscopy;  Laterality: N/A;  2:00 PM, ASA 3   HEMORRHOID SURGERY     LEFT HEART CATH AND CORONARY ANGIOGRAPHY N/A 06/09/2021   Procedure: LEFT HEART CATH AND CORONARY ANGIOGRAPHY;  Surgeon: Jordan, Peter M, MD;  Location: [facility] INVASIVE CV LAB;  Service: Cardiovascular;  Laterality: N/A;   LEFT HEART CATH AND CORONARY ANGIOGRAPHY N/A 07/23/2021   Procedure: LEFT HEART CATH AND CORONARY ANGIOGRAPHY;  Surgeon: Court Dorn PARAS, MD;  Location: [facility] INVASIVE CV LAB;  Service: Cardiovascular;  Laterality: N/A;   skin cancer removed right ear     TRANSFORAMINAL LUMBAR INTERBODY FUSION (TLIF) WITH PEDICLE SCREW FIXATION 2 LEVEL N/A 07/06/2018   Procedure: TRANSFORAMINAL LUMBAR INTERBODY FUSION (TLIF) L4-S1;  Surgeon: Burnetta Aures, MD;  Location: [facility] OR;  Service: Orthopedics;  Laterality: N/A;     reports that he  has never smoked. He has never used smokeless tobacco. He reports current alcohol use of about 42.0 standard drinks of alcohol per week. He reports that he does not use drugs.  Allergies[1]  Family History  Problem Relation Age of Onset   Heart attack Father 37       Died suddenly   Hypertension Father    Vision loss Sister        one eye   Cancer Brother        skin   Diabetes Brother    Sleep apnea Daughter    Sleep apnea Son     Prior to Admission medications  Medication Sig Start Date End Date Taking? Authorizing Provider  Alpha-Lipoic Acid 600 MG CAPS Take 1 capsule (600 mg total) by mouth in the morning. For neuropathy Patient not taking: Reported on 10/05/2024 05/03/23   Jolinda Norene HERO, DO  amLODipine  (NORVASC ) 10 MG tablet Take 1 tablet (10 mg total) by mouth daily. Per Delsie Riggs renal 01/06/24   Jolinda Norene HERO, DO  aspirin  81 MG EC tablet Take 1 tablet (81 mg total) by mouth daily. Swallow whole. 06/13/21   Kroeger, Krista M., PA-C  atorvastatin  (LIPITOR ) 80 MG tablet Take 1 tablet (80 mg total) by mouth daily. 01/06/24   Jolinda Norene HERO, DO  carvedilol  (COREG ) 12.5 MG tablet TAKE ONE TABLET TWICE DAILY WITH MEAL(S) 07/10/24   Lynwood Schilling, MD  cloNIDine  (CATAPRES ) 0.2 MG tablet Take 1 tablet (0.2 mg total) by mouth 2 (two) times daily. 06/20/24   Lynwood Schilling, MD  Continuous Glucose Receiver (FREESTYLE LIBRE 3 READER) DEVI Check BGs continuously E11.22, N18.2, Z79.4 01/06/24   Jolinda Norene M, DO  Continuous Glucose Sensor (FREESTYLE LIBRE 3 PLUS SENSOR) MISC Check BG continuously E11.22, N18.2, Z79.4. Change sensor every 15 days. 01/06/24   Jolinda Norene HERO, DO  dapagliflozin  propanediol (FARXIGA ) 10 MG TABS tablet Take 1 tablet (10 mg total) by mouth daily before breakfast. 10/28/23   Jolinda Norene M, DO  desloratadine  (CLARINEX ) 5 MG tablet Take 1 tablet (5 mg total) by mouth every other day. For drainage/ allergies Patient not taking: Reported on  10/05/2024 07/25/24   Jolinda Norene HERO, DO  furosemide  (LASIX ) 40 MG tablet Take 1 tablet (40 mg total) by mouth daily. 01/06/24   Jolinda Norene HERO, DO  gabapentin  (NEURONTIN ) 100 MG capsule Take 2 capsules (200 mg total) by mouth 2 (two) times daily. 07/25/24   Jolinda Norene M, DO  insulin  degludec (TRESIBA  FLEXTOUCH) 100 UNIT/ML FlexTouch Pen INJECT 38 UP TO 50 UNITS DAILY AS DIRECTED. 01/06/24   Jolinda Norene M, DO  Insulin  Pen Needle (PEN NEEDLES) 31G X 5 MM MISC Use daily with insulin  Dx E11.9 12/09/20   Jolinda Norene M, DO  isosorbide  mononitrate (IMDUR ) 60 MG 24 hr tablet TAKE 1 TABLET DAILY 07/06/24   Lynwood Schilling, MD  losartan (COZAAR) 25 MG tablet Take 25 mg by mouth daily. Per renal 10/25/22   [provider]  nitroGLYCERIN  (NITROSTAT ) 0.4 MG SL tablet Place 1 tablet (0.4 mg total) under the tongue every 5 (five) minutes as needed for chest pain. 09/05/23 10/05/24  Jolinda,  Ashly M, DO  pantoprazole  (PROTONIX ) 40 MG tablet Take 1 tablet (40 mg total) by mouth daily. Patient not taking: Reported on 10/05/2024 01/06/24 07/25/24  Jolinda Norene HERO, DO  spironolactone  (ALDACTONE ) 25 MG tablet Take 25 mg by mouth daily. Take 1/2 tablet daily.    Rachele Gaynell RAMAN, MD    Physical Exam: Vitals:   10/30/24 1830 10/30/24 1900 10/30/24 2000 10/30/24 2115  BP: (!) 184/72  (!) 166/78 (!) 148/74  Pulse: 78  78 77  Resp: 11 11 12 12   Temp:   97.7 F (36.5 C)   TempSrc:   Oral   SpO2: 97%  97% 97%  Weight:      Height:        Constitutional: NAD, calm, comfortable Vitals:   10/30/24 1830 10/30/24 1900 10/30/24 2000 10/30/24 2115  BP: (!) 184/72  (!) 166/78 (!) 148/74  Pulse: 78  78 77  Resp: 11 11 12 12   Temp:   97.7 F (36.5 C)   TempSrc:   Oral   SpO2: 97%  97% 97%  Weight:      Height:       Eyes: PERRL, lids and conjunctivae normal ENMT: Mucous membranes are moist. Posterior pharynx clear of any exudate or lesions.Normal dentition.  Neck: normal,  supple, no masses, no thyromegaly Respiratory: clear to auscultation bilaterally, no wheezing, no crackles. Normal respiratory effort. No accessory muscle use.  Cardiovascular: Regular rate and rhythm, no murmurs / rubs / gallops.  +1 pitting edema bilateral lower extremity.  2+ pedal pulses. No carotid bruits.  Abdomen: no tenderness, no masses palpated. No hepatosplenomegaly. Bowel sounds positive.  Musculoskeletal: no clubbing / cyanosis. No joint deformity upper and lower extremities. Good ROM, no contractures. Normal muscle tone.  Skin: no rashes, lesions, ulcers. No induration Neurologic: CN 2-12 grossly intact. Sensation intact, DTR normal. Strength 5/5 in all 4.  Psychiatric: Normal judgment and insight. Alert and oriented x 3. Normal mood.    Labs on Admission: I have personally reviewed following labs and imaging studies  CBC: Recent Labs  Lab 10/30/24 1231  WBC 17.7*  HGB 12.2*  HCT 35.8*  MCV 91.8  PLT 211   Basic Metabolic Panel: Recent Labs  Lab 10/30/24 1231  NA 139  K 4.8  CL 103  CO2 22  GLUCOSE 158*  BUN 52*  CREATININE 2.69*  CALCIUM  9.9   GFR: Estimated Creatinine Clearance: 30.9 mL/min (A) (by C-G formula based on SCr of 2.69 mg/dL (H)). Liver Function Tests: Recent Labs  Lab 10/30/24 1231  AST 23  ALT 15  ALKPHOS 89  BILITOT 0.7  PROT 7.6  ALBUMIN 4.6   Recent Labs  Lab 10/30/24 1231  LIPASE 124*   No results for input(s): AMMONIA in the last 168 hours. Coagulation Profile: No results for input(s): INR, PROTIME in the last 168 hours. Cardiac Enzymes: No results for input(s): CKTOTAL, CKMB, CKMBINDEX, TROPONINI in the last 168 hours. BNP (last 3 results) No results for input(s): PROBNP in the last 8760 hours. HbA1C: No results for input(s): HGBA1C in the last 72 hours. CBG: Recent Labs  Lab 10/30/24 1238 10/30/24 1817  GLUCAP 158* 151*   Lipid Profile: No results for input(s): CHOL, HDL, LDLCALC,  TRIG, CHOLHDL, LDLDIRECT in the last 72 hours. Thyroid  Function Tests: No results for input(s): TSH, T4TOTAL, FREET4, T3FREE, THYROIDAB in the last 72 hours. Anemia Panel: No results for input(s): VITAMINB12, FOLATE, FERRITIN, TIBC, IRON, RETICCTPCT in the last 72 hours. Urine analysis:  Component Value Date/Time   COLORURINE YELLOW 05/15/2020 1814   APPEARANCEUR CLEAR 05/15/2020 1814   APPEARANCEUR Clear 11/10/2018 1543   LABSPEC 1.005 05/15/2020 1814   PHURINE 5.0 05/15/2020 1814   GLUCOSEU 50 (A) 05/15/2020 1814   HGBUR SMALL (A) 05/15/2020 1814   BILIRUBINUR NEGATIVE 05/15/2020 1814   BILIRUBINUR Negative 11/10/2018 1543   KETONESUR NEGATIVE 05/15/2020 1814   PROTEINUR NEGATIVE 05/15/2020 1814   NITRITE NEGATIVE 05/15/2020 1814   LEUKOCYTESUR NEGATIVE 05/15/2020 1814    Radiological Exams on Admission: CT CHEST ABDOMEN PELVIS WO CONTRAST Result Date: 10/30/2024 EXAM: CT CHEST, ABDOMEN AND PELVIS WITHOUT CONTRAST 10/30/2024 06:40:00 PM TECHNIQUE: CT of the chest, abdomen and pelvis was performed without the administration of intravenous contrast. Multiplanar reformatted images are provided for review. Automated exposure control, iterative reconstruction, and/or weight based adjustment of the mA/kV was utilized to reduce the radiation dose to as low as reasonably achievable. COMPARISON: None available. CLINICAL HISTORY: Sepsis. FINDINGS: CHEST: MEDIASTINUM AND LYMPH NODES: Heart and pericardium are unremarkable. Coronary and aortic atherosclerotic calcifications are noted. The central airways are clear. No mediastinal, hilar or axillary lymphadenopathy. LUNGS AND PLEURA: No focal consolidation or pulmonary edema. No pleural effusion or pneumothorax. ABDOMEN AND PELVIS: LIVER: The liver is unremarkable. GALLBLADDER AND BILE DUCTS: Small gallstones are present. No biliary ductal dilatation. SPLEEN: The spleen is mildly enlarged. PANCREAS: No acute abnormality.  ADRENAL GLANDS: No acute abnormality. KIDNEYS, URETERS AND BLADDER: There are few punctate calculi in both kidneys. No stones in the ureters. No hydronephrosis. There is mild nonspecific bilateral perinephric fat stranding. The bladder is distended. GI AND BOWEL: Stomach demonstrates no acute abnormality. There is diffuse colonic diverticulosis. The appendix appears normal. There is no bowel obstruction. REPRODUCTIVE ORGANS: No acute abnormality. PERITONEUM AND RETROPERITONEUM: No ascites. No free air. VASCULATURE: Aorta is normal in caliber. ABDOMINAL AND PELVIS LYMPH NODES: There are mildly enlarged right inguinal lymph nodes measuring up to 12 mm. BONES AND SOFT TISSUES: L4-S1 posterior fusion hardware present. No acute osseous abnormality. No focal soft tissue abnormality. IMPRESSION: 1. No acute findings in the chest, abdomen, or pelvis. 2. Cholelithiasis. 3. Mild splenomegaly. 4. Punctate bilateral nephrolithiasis. 5. Diffuse colonic diverticulosis. 6. Mildly enlarged right inguinal lymph nodes measuring up to 12 mm, nonspecific. 7. Distended urinary bladder. 8. Coronary and aortic atherosclerotic calcifications. Electronically signed by: Greig Pique MD 10/30/2024 06:55 PM EST RP Workstation: [id number]   CT Head Wo Contrast Result Date: 10/30/2024 EXAM: CT HEAD WITHOUT CONTRAST 10/30/2024 06:40:00 PM TECHNIQUE: CT of the head was performed without the administration of intravenous contrast. Automated exposure control, iterative reconstruction, and/or weight based adjustment of the mA/kV was utilized to reduce the radiation dose to as low as reasonably achievable. COMPARISON: None available. CLINICAL HISTORY: Delirium FINDINGS: BRAIN AND VENTRICLES: No acute hemorrhage. No evidence of acute infarct. No hydrocephalus. No extra-axial collection. No mass effect or midline shift. Atherosclerotic calcifications are present within the cavernous internal carotid arteries. ORBITS: No acute abnormality. SINUSES:  Mucosal thickening in maxillary sinuses. SOFT TISSUES AND SKULL: No acute soft tissue abnormality. No skull fracture. IMPRESSION: 1. No acute intracranial abnormality. Electronically signed by: Morgane Naveau MD 10/30/2024 06:52 PM EST RP Workstation: [id number]   DG Chest 2 View Result Date: 10/30/2024 CLINICAL DATA:  Nausea, vomiting EXAM: CHEST - 2 VIEW COMPARISON:  July 19, 2021 FINDINGS: The heart size and mediastinal contours are within normal limits. Right lung is clear. Minimal left basilar subsegmental atelectasis or scarring is noted. The visualized skeletal structures are  unremarkable. IMPRESSION: Minimal left basilar subsegmental atelectasis or scarring. Electronically Signed   By: Lynwood Landy Raddle M.D.   On: 10/30/2024 14:42    EKG: Independently reviewed.  Sinus rhythm with first-degree AV block  Assessment/Plan Principal Problem:   Gastroenteritis   Nausea/vomiting/diarrhea/generalized weakness/presumed gastroenteritis: All symptoms started after eating at a gas station.  As mentioned above, son with the similar symptoms who ate the same food.  However he is also tested positive for RSV which may cause the symptoms as well.  I will order GI pathogen panel for him.  Treat symptomatically and with supportive care.  Continue gentle IV hydration that is ordered by the ED physician.  Will start him on full liquid diet and advance as tolerated.  RSV bronchitis: Patient has no respiratory symptoms and chest x-ray unremarkable.  He is not hypoxic.  Treat symptomatically and supportively for now.  Essential hypertension: Blood pressure slightly elevated in the ED.  He is on several antihypertensive medications PTA.  For now I will resume necessary medications including Coreg , clonidine , Imdur  and Aldactone .  Will hold amlodipine  and losartan.  Chronic diastolic congestive heart failure: Appears slightly dehydrated.  Resume Farxiga .  Type 2 diabetes mellitus: Appears to be taking Lantus   38 to 50 units.  Since he is going to be on clear liquid diet and his nausea and vomiting may cause poor p.o. intake, I will start him on 25 units of Lantus  for now and SSI.  History of CAD/hyperlipidemia: Resume aspirin  and statin.  DVT prophylaxis: heparin  injection 5,000 Units Start: 10/30/24 2200 Code Status: Full code Family Communication: None present at bedside.  Plan of care discussed with patient in length and he verbalized understanding and agreed with it. Disposition Plan: Potential discharge in 1 to 2 days Consults called: None  Fredia Skeeter MD [facility]  *Please note that this is a verbal dictation therefore any spelling or grammatical errors are due to the Dragon Medical One system interpretation.  Please page via Amion and do not message via secure chat for urgent patient care matters. Secure chat can be used for non urgent patient care matters. 10/30/2024, 9:44 PM  To contact the attending provider between 7A-7P or the covering provider during after hours 7P-7A, please log into the web site [email or web address]     [1] No Known Allergies

## 2024-10-30 NOTE — ED Notes (Signed)
 Urine sample requested from pt. Pt refusing to have in and out cath done on the ED.

## 2024-10-31 DIAGNOSIS — B338 Other specified viral diseases: Secondary | ICD-10-CM | POA: Diagnosis not present

## 2024-10-31 LAB — COMPREHENSIVE METABOLIC PANEL WITH GFR
ALT: 11 U/L (ref 0–44)
AST: 23 U/L (ref 15–41)
Albumin: 3.9 g/dL (ref 3.5–5.0)
Alkaline Phosphatase: 62 U/L (ref 38–126)
Anion gap: 10 (ref 5–15)
BUN: 45 mg/dL — ABNORMAL HIGH (ref 8–23)
CO2: 24 mmol/L (ref 22–32)
Calcium: 9.1 mg/dL (ref 8.9–10.3)
Chloride: 109 mmol/L (ref 98–111)
Creatinine, Ser: 2.4 mg/dL — ABNORMAL HIGH (ref 0.61–1.24)
GFR, Estimated: 28 mL/min — ABNORMAL LOW (ref >60)
Glucose, Bld: 87 mg/dL (ref 70–99)
Potassium: 4.4 mmol/L (ref 3.5–5.1)
Sodium: 143 mmol/L (ref 135–145)
Total Bilirubin: 0.6 mg/dL (ref 0.0–1.2)
Total Protein: 6.2 g/dL — ABNORMAL LOW (ref 6.5–8.1)

## 2024-10-31 LAB — HEMOGLOBIN A1C
Hgb A1c MFr Bld: 6 % — ABNORMAL HIGH (ref 4.8–5.6)
Mean Plasma Glucose: 125.5 mg/dL

## 2024-10-31 LAB — URINALYSIS, ROUTINE W REFLEX MICROSCOPIC
Bilirubin Urine: NEGATIVE
Glucose, UA: >=500 mg/dL — AB
Ketones, ur: NEGATIVE mg/dL
Leukocytes,Ua: NEGATIVE
Nitrite: NEGATIVE
Protein, ur: 100 mg/dL — AB
Specific Gravity, Urine: 1.015 (ref 1.005–1.030)
pH: 6 (ref 5.0–8.0)

## 2024-10-31 LAB — URINALYSIS, MICROSCOPIC (REFLEX)

## 2024-10-31 LAB — CBC
HCT: 31.6 % — ABNORMAL LOW (ref 39.0–52.0)
Hemoglobin: 10.4 g/dL — ABNORMAL LOW (ref 13.0–17.0)
MCH: 30.3 pg (ref 26.0–34.0)
MCHC: 32.9 g/dL (ref 30.0–36.0)
MCV: 92.1 fL (ref 80.0–100.0)
Platelets: 168 K/uL (ref 150–400)
RBC: 3.43 MIL/uL — ABNORMAL LOW (ref 4.22–5.81)
RDW: 13.5 % (ref 11.5–15.5)
WBC: 9.2 K/uL (ref 4.0–10.5)
nRBC: 0 % (ref 0.0–0.2)

## 2024-10-31 LAB — URINE DRUG SCREEN
Amphetamines: NEGATIVE
Barbiturates: NEGATIVE
Benzodiazepines: NEGATIVE
Cocaine: NEGATIVE
Fentanyl: NEGATIVE
Methadone Scn, Ur: NEGATIVE
Opiates: NEGATIVE
Tetrahydrocannabinol: POSITIVE — AB

## 2024-10-31 LAB — GLUCOSE, CAPILLARY: Glucose-Capillary: 232 mg/dL — ABNORMAL HIGH (ref 70–99)

## 2024-10-31 LAB — HIV ANTIBODY (ROUTINE TESTING W REFLEX): HIV Screen 4th Generation wRfx: NONREACTIVE

## 2024-10-31 LAB — CBG MONITORING, ED: Glucose-Capillary: 91 mg/dL (ref 70–99)

## 2024-10-31 NOTE — Plan of Care (Signed)

## 2024-10-31 NOTE — TOC Transition Note (Signed)
 Transition of Care [facility]) - Discharge Note   Patient Details  Name: [name] MRN: [id number] Date of Birth: 09/20/1956  Transition of Care [facility]) CM/SW Contact:  Rosalva Jon Bloch, RN Phone Number: 10/31/2024, 5:51 PM   Clinical Narrative:    Patient will DC to: home Anticipated DC date: 10/31/2024 Family notified: yes Transport by: car      Diverticulosis.RSV+.  Per MD patient ready for DC today . RN, patient,  and patient's family notified of DC.  Pt declined home health services recommended by therapist. Pt without DME needs. States has RW,BSC @ home. Post hospital f/u noted on AVS.  Pt without RX MED concerns or transportation issues.  RNCM will sign off for now as intervention is no longer needed. Please consult us  again if new needs arise.    Final next level of care: Home/Self Care (declined homehealth services) Barriers to Discharge: No Barriers Identified   Patient Goals and CMS Choice            Discharge Placement                       Discharge Plan and Services Additional resources added to the After Visit Summary for                                       Social Drivers of Health (SDOH) Interventions SDOH Screenings   Food Insecurity: No Food Insecurity (10/31/2024)  Housing: Low Risk (10/31/2024)  Transportation Needs: No Transportation Needs (10/31/2024)  Utilities: Not At Risk (10/31/2024)  Alcohol Screen: Low Risk (03/19/2024)  Depression (PHQ2-9): Low Risk (10/05/2024)  Financial Resource Strain: Low Risk (03/19/2024)  Physical Activity: Sufficiently Active (03/19/2024)  Social Connections: Moderately Isolated (10/31/2024)  Stress: No Stress Concern Present (03/19/2024)  Tobacco Use: Low Risk (10/30/2024)  Health Literacy: Adequate Health Literacy (03/19/2024)     Readmission Risk Interventions     No data to display

## 2024-10-31 NOTE — Hospital Course (Signed)
 Mujtaba Bollig is a 68 y.o. male with a history of hypertension, hyperlipidemia, diabetes mellitus type 2, CAD, CKD stage IV, diastolic heart failure.  Patient presented secondary to nausea, vomiting, diarrhea, weakness and found to have evidence of an RSV infection.  Initial concern for possible gastroenteritis, however patient's symptoms of nausea vomiting and diarrhea resolved with no further episodes while inpatient.  Patient received IV fluids and worked with physical therapy with recommendations for home health services, however patient declined.

## 2024-10-31 NOTE — Evaluation (Signed)
 Physical Therapy Evaluation Patient Details Name: [name] MRN: [id number] DOB: Feb 26, 1956 Today's Date: 10/31/2024  History of Present Illness  68 y.o. male presents to [facility] hospital on 10/30/2024 with nausea, vomiting, diarrhea, weakness. Imaging notable for diverticulosis. Also RSV+. PMH includes HTN, HLD, DMII, CAD, CKD IV, diastolic CHF.  Clinical Impression  Pt presents to PT with deficits in functional mobility, strength, power, gait, balance, endurance. Pt is able to ambulate for short household distances, reporting fatigue limiting his tolerance for mobility at this time. Pt balances well for toileting during session, otherwise utilizes support of the IV pole to steady himself for majority of ambulation. Pt will benefit from frequent ambulation in an effort to improve endurance and to restore baseline. HHPT recommended at this time.         If plan is discharge home, recommend the following: A little help with walking and/or transfers;A little help with bathing/dressing/bathroom;Assistance with cooking/housework;Assist for transportation;Help with stairs or ramp for entrance   Can travel by private vehicle        Equipment Recommendations None recommended by PT (owns necessary DME)  Recommendations for Other Services       Functional Status Assessment Patient has had a recent decline in their functional status and demonstrates the ability to make significant improvements in function in a reasonable and predictable amount of time.     Precautions / Restrictions Precautions Precautions: Fall Restrictions Weight Bearing Restrictions Per Provider Order: No      Mobility  Bed Mobility               General bed mobility comments: received and left sitting at edge of bed    Transfers Overall transfer level: Needs assistance Equipment used: None Transfers: Sit to/from Stand Sit to Stand: Contact guard assist           General transfer comment: hand  hold, pt pulls on PT to rise into standing, stands from commode without PT assist    Ambulation/Gait Ambulation/Gait assistance: Supervision Gait Distance (Feet): 50 Feet Assistive device: IV Pole, None Gait Pattern/deviations: Step-through pattern Gait velocity: reduced Gait velocity interpretation: <1.8 ft/sec, indicate of risk for recurrent falls   General Gait Details: slow and cautious step-through gait, pt reports mobilizing slowly at baseline due to chronic peripheral neuropathy. Pt reports fatigue, limiting ambulation distance  Stairs            Wheelchair Mobility     Tilt Bed    Modified Rankin (Stroke Patients Only)       Balance Overall balance assessment: Needs assistance Sitting-balance support: No upper extremity supported, Feet supported Sitting balance-Leahy Scale: Good     Standing balance support: No upper extremity supported, During functional activity Standing balance-Leahy Scale: Fair                               Pertinent Vitals/Pain Pain Assessment Pain Assessment: No/denies pain    Home Living Family/patient expects to be discharged to:: Private residence Living Arrangements: Spouse/significant other Available Help at Discharge: Family Type of Home: House Home Access: Stairs to enter Entrance Stairs-Rails: None Entrance Stairs-Number of Steps: 1+1   Home Layout: One level Home Equipment: Grab bars - tub/shower;Cane - quad;Cane - single Librarian, Academic (2 wheels) Additional Comments: Wife had hip surgery 2 months ago but pt only has to assist her with socks    Prior Function Prior Level of Function : Independent/Modified Independent;Driving  Mobility Comments: no AD ADLs Comments: Indep with ADLs, IADls, driving. 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     Extremity/Trunk Assessment   Upper Extremity Assessment Upper Extremity Assessment: Overall WFL for tasks assessed    Lower Extremity  Assessment Lower Extremity Assessment: Generalized weakness;RLE deficits/detail;LLE deficits/detail RLE Sensation: history of peripheral neuropathy LLE Sensation: history of peripheral neuropathy    Cervical / Trunk Assessment Cervical / Trunk Assessment: Normal  Communication   Communication Communication: No apparent difficulties    Cognition Arousal: Alert Behavior During Therapy: WFL for tasks assessed/performed   PT - Cognitive impairments: No apparent impairments                         Following commands: Intact       Cueing Cueing Techniques: Verbal cues     General Comments General comments (skin integrity, edema, etc.): VSS on RA    Exercises     Assessment/Plan    PT Assessment Patient needs continued PT services  PT Problem List Decreased strength;Decreased activity tolerance;Decreased balance;Decreased mobility;Decreased knowledge of use of DME;Cardiopulmonary status limiting activity       PT Treatment Interventions DME instruction;Gait training;Stair training;Functional mobility training;Therapeutic exercise;Therapeutic activities;Balance training;Neuromuscular re-education;Patient/family education    PT Goals (Current goals can be found in the Care Plan section)  Acute Rehab PT Goals Patient Stated Goal: to improve strength PT Goal Formulation: With patient/family Time For Goal Achievement: 11/14/24 Potential to Achieve Goals: Good Additional Goals Additional Goal #1: Pt will score >19/24 on the DGI to indicate a reduced risk for falls    Frequency Min 2X/week     Co-evaluation               AM-PAC PT 6 Clicks Mobility  Outcome Measure Help needed turning from your back to your side while in a flat bed without using bedrails?: A Little Help needed moving from lying on your back to sitting on the side of a flat bed without using bedrails?: A Little Help needed moving to and from a bed to a chair (including a wheelchair)?: A  Little Help needed standing up from a chair using your arms (e.g., wheelchair or bedside chair)?: A Little Help needed to walk in hospital room?: A Little Help needed climbing 3-5 steps with a railing? : A Lot 6 Click Score: 17    End of Session Equipment Utilized During Treatment: Gait belt Activity Tolerance: Patient limited by fatigue Patient left: in bed;with call bell/phone within reach;with family/visitor present;with nursing/sitter in room Nurse Communication: Mobility status PT Visit Diagnosis: Other abnormalities of gait and mobility (R26.89);Muscle weakness (generalized) (M62.81)    Time: 1410-1435 PT Time Calculation (min) (ACUTE ONLY): 25 min   Charges:   PT Evaluation $PT Eval Low Complexity: 1 Low   PT General Charges $$ ACUTE PT VISIT: 1 Visit        Bernardino JINNY Ruth, PT, DPT Acute Rehabilitation Office [phone]   Bernardino JINNY Ruth 10/31/2024, 3:55 PM

## 2024-10-31 NOTE — Discharge Summary (Addendum)
 Physician Discharge Summary   Patient: [name] MRN: [id number] DOB: [birth year]  Admit date:     10/30/2024  Discharge date: 10/31/2024  Discharge Physician: Elgin Lam, MD   PCP: Jolinda Norene HERO, DO   Recommendations at discharge:  PCP visit for hospital follow-up  Discharge Diagnoses: Principal Problem:   RSV infection  Resolved Problems:   * No resolved hospital problems. *  Hospital Course: [name] is a 68 y.o. male with a history of hypertension, hyperlipidemia, diabetes mellitus type 2, CAD, CKD stage IV, diastolic heart failure.  Patient presented secondary to nausea, vomiting, diarrhea, weakness and found to have evidence of an RSV infection.  Initial concern for possible gastroenteritis, however patient's symptoms of nausea vomiting and diarrhea resolved with no further episodes while inpatient.  Patient received IV fluids and worked with physical therapy with recommendations for home health services, however patient declined.  Assessment and Plan:  RSV infection Asymptomatic at this time without hypoxia, shortness of breath, cough.  Nausea and vomiting Diarrhea Concerned this was possibly related to gastroenteritis from food exposure.  Patient without recurrent symptoms of nausea or vomiting while admitted.  Patient also without recurrent diarrhea.  Afebrile.  Patient was given IV fluids for associated dehydration.  Lightheadedness with ambulation Episode occurred shortly after getting up after prolonged sitting on his toilet.  Presentation seems most likely consistent with orthostatic hypotension in setting of multiple antihypertensives in addition to evidence of dehydration.  Patient's blood pressure was monitored while admitted.  Orthostatic vitals obtained and were negative (not obtained on presentation).  PT and OT were consulted and patient ambulated without repeat of symptoms.  Per patient patient had good mobility with physical  therapy.  Primary hypertension Patient is on Coreg , clonidine , Imdur , spironolactone , amlodipine  as an outpatient.  He is also on Lasix .  Amlodipine  and Lasix  held on admission.  Blood pressure was mostly controlled with continuation of remaining blood pressure medications.  Will recommend holdin amlodipine  on discharge and consider resuming if blood pressure remains uncontrolled.  Leukocytosis White blood cell count of 17,200 on admission.  Unclear if this is related to underlying dehydration versus his infection versus combination.  Resolved with IV fluids.  Chronic diastolic heart failure No evidence of exacerbation.  Resume Farxiga  and Lasix .  CKD stage IV Creatinine at baseline.  Diabetes mellitus type 2 Continue home regimen on discharge.  CAD Hyperlipidemia Continue aspirin  and Lipitor .   Consultants: None Procedures performed: None Disposition: Home health (patient is declining home health services) Diet recommendation: Carb modified diet   DISCHARGE MEDICATION: Allergies as of 10/31/2024   No Known Allergies      Medication List     PAUSE taking these medications    amLODipine  10 MG tablet Wait to take this until your doctor or other care provider tells you to start again. Commonly known as: NORVASC  Take 1 tablet (10 mg total) by mouth daily. Per Delsie Riggs renal       TAKE these medications    aspirin  EC 81 MG tablet Take 1 tablet (81 mg total) by mouth daily. Swallow whole.   atorvastatin  80 MG tablet Commonly known as: LIPITOR  Take 1 tablet (80 mg total) by mouth daily.   carvedilol  12.5 MG tablet Commonly known as: COREG  TAKE ONE TABLET TWICE DAILY WITH MEAL(S)   cloNIDine  0.2 MG tablet Commonly known as: CATAPRES  Take 1 tablet (0.2 mg total) by mouth 2 (two) times daily.   furosemide  40 MG tablet Commonly known as:  LASIX  Take 1 tablet (40 mg total) by mouth daily.   gabapentin  100 MG capsule Commonly known as: NEURONTIN  Take 2  capsules (200 mg total) by mouth 2 (two) times daily.   isosorbide  mononitrate 60 MG 24 hr tablet Commonly known as: IMDUR  TAKE 1 TABLET DAILY   losartan 25 MG tablet Commonly known as: COZAAR Take 25 mg by mouth daily. Per renal   nitroGLYCERIN  0.4 MG SL tablet Commonly known as: Nitrostat  Place 1 tablet (0.4 mg total) under the tongue every 5 (five) minutes as needed for chest pain.   spironolactone  25 MG tablet Commonly known as: ALDACTONE  Take 25 mg by mouth daily.   Tresiba  FlexTouch 100 UNIT/ML FlexTouch Pen Generic drug: insulin  degludec INJECT 38 UP TO 50 UNITS DAILY AS DIRECTED. What changed:  how much to take how to take this when to take this additional instructions        Follow-up Information     Jolinda Norene HERO, DO. Schedule an appointment as soon as possible for a visit.   Specialty: Family Medicine Why: For hospital follow-up Contact information: [address] [place] [address] [phone]                Discharge Exam: BP 113/85 (BP Location: Left Arm)   Pulse 67   Temp 97.6 F (36.4 C) (Oral)   Resp 15   Ht 6' (1.829 m)   Wt 97.5 kg   SpO2 97%   BMI 29.15 kg/m   General exam: Appears calm and comfortable. Respiratory system: Clear to auscultation. Respiratory effort normal. Cardiovascular system: S1 & S2 heard, RRR. Gastrointestinal system: Abdomen is nondistended, soft and nontender. Normal bowel sounds heard. Central nervous system: Alert and oriented. No focal neurological deficits. Musculoskeletal: No edema. No calf tenderness   Condition at discharge: stable  The results of significant diagnostics from this hospitalization (including imaging, microbiology, ancillary and laboratory) are listed below for reference.   Imaging Studies: CT CHEST ABDOMEN PELVIS WO CONTRAST Result Date: 10/30/2024 EXAM: CT CHEST, ABDOMEN AND PELVIS WITHOUT CONTRAST 10/30/2024 06:40:00 PM TECHNIQUE: CT of the chest, abdomen and pelvis  was performed without the administration of intravenous contrast. Multiplanar reformatted images are provided for review. Automated exposure control, iterative reconstruction, and/or weight based adjustment of the mA/kV was utilized to reduce the radiation dose to as low as reasonably achievable. COMPARISON: None available. CLINICAL HISTORY: Sepsis. FINDINGS: CHEST: MEDIASTINUM AND LYMPH NODES: Heart and pericardium are unremarkable. Coronary and aortic atherosclerotic calcifications are noted. The central airways are clear. No mediastinal, hilar or axillary lymphadenopathy. LUNGS AND PLEURA: No focal consolidation or pulmonary edema. No pleural effusion or pneumothorax. ABDOMEN AND PELVIS: LIVER: The liver is unremarkable. GALLBLADDER AND BILE DUCTS: Small gallstones are present. No biliary ductal dilatation. SPLEEN: The spleen is mildly enlarged. PANCREAS: No acute abnormality. ADRENAL GLANDS: No acute abnormality. KIDNEYS, URETERS AND BLADDER: There are few punctate calculi in both kidneys. No stones in the ureters. No hydronephrosis. There is mild nonspecific bilateral perinephric fat stranding. The bladder is distended. GI AND BOWEL: Stomach demonstrates no acute abnormality. There is diffuse colonic diverticulosis. The appendix appears normal. There is no bowel obstruction. REPRODUCTIVE ORGANS: No acute abnormality. PERITONEUM AND RETROPERITONEUM: No ascites. No free air. VASCULATURE: Aorta is normal in caliber. ABDOMINAL AND PELVIS LYMPH NODES: There are mildly enlarged right inguinal lymph nodes measuring up to 12 mm. BONES AND SOFT TISSUES: L4-S1 posterior fusion hardware present. No acute osseous abnormality. No focal soft tissue abnormality. IMPRESSION: 1.  No acute findings in the chest, abdomen, or pelvis. 2. Cholelithiasis. 3. Mild splenomegaly. 4. Punctate bilateral nephrolithiasis. 5. Diffuse colonic diverticulosis. 6. Mildly enlarged right inguinal lymph nodes measuring up to 12 mm, nonspecific. 7.  Distended urinary bladder. 8. Coronary and aortic atherosclerotic calcifications. Electronically signed by: Greig Pique MD 10/30/2024 06:55 PM EST RP Workstation: [id number]   CT Head Wo Contrast Result Date: 10/30/2024 EXAM: CT HEAD WITHOUT CONTRAST 10/30/2024 06:40:00 PM TECHNIQUE: CT of the head was performed without the administration of intravenous contrast. Automated exposure control, iterative reconstruction, and/or weight based adjustment of the mA/kV was utilized to reduce the radiation dose to as low as reasonably achievable. COMPARISON: None available. CLINICAL HISTORY: Delirium FINDINGS: BRAIN AND VENTRICLES: No acute hemorrhage. No evidence of acute infarct. No hydrocephalus. No extra-axial collection. No mass effect or midline shift. Atherosclerotic calcifications are present within the cavernous internal carotid arteries. ORBITS: No acute abnormality. SINUSES: Mucosal thickening in maxillary sinuses. SOFT TISSUES AND SKULL: No acute soft tissue abnormality. No skull fracture. IMPRESSION: 1. No acute intracranial abnormality. Electronically signed by: Morgane Naveau MD 10/30/2024 06:52 PM EST RP Workstation: [id number]   DG Chest 2 View Result Date: 10/30/2024 CLINICAL DATA:  Nausea, vomiting EXAM: CHEST - 2 VIEW COMPARISON:  July 19, 2021 FINDINGS: The heart size and mediastinal contours are within normal limits. Right lung is clear. Minimal left basilar subsegmental atelectasis or scarring is noted. The visualized skeletal structures are unremarkable. IMPRESSION: Minimal left basilar subsegmental atelectasis or scarring. Electronically Signed   By: Lynwood Landy Raddle M.D.   On: 10/30/2024 14:42    Microbiology: Results for orders placed or performed during the hospital encounter of 10/30/24  Resp panel by RT-PCR (RSV, Flu A&B, Covid) Anterior Nasal Swab     Status: Abnormal   Collection Time: 10/30/24 12:49 PM   Specimen: Anterior Nasal Swab  Result Value Ref Range Status   SARS  Coronavirus 2 by RT PCR NEGATIVE NEGATIVE Final   Influenza A by PCR NEGATIVE NEGATIVE Final   Influenza B by PCR NEGATIVE NEGATIVE Final    Comment: (NOTE) The Xpert Xpress SARS-CoV-2/FLU/RSV plus assay is intended as an aid in the diagnosis of influenza from Nasopharyngeal swab specimens and should not be used as a sole basis for treatment. Nasal washings and aspirates are unacceptable for Xpert Xpress SARS-CoV-2/FLU/RSV testing.  Fact Sheet for Patients: bloggercourse.com  Fact Sheet for Healthcare Providers: seriousbroker.it  This test is not yet approved or cleared by the [place]  FDA and has been authorized for detection and/or diagnosis of SARS-CoV-2 by FDA under an Emergency Use Authorization (EUA). This EUA will remain in effect (meaning this test can be used) for the duration of the COVID-19 declaration under Section 564(b)(1) of the Act, 21 U.S.C. section 360bbb-3(b)(1), unless the authorization is terminated or revoked.     Resp Syncytial Virus by PCR POSITIVE (A) NEGATIVE Final    Comment: (NOTE) Fact Sheet for Patients: bloggercourse.com  Fact Sheet for Healthcare Providers: seriousbroker.it  This test is not yet approved or cleared by the [place]  FDA and has been authorized for detection and/or diagnosis of SARS-CoV-2 by FDA under an Emergency Use Authorization (EUA). This EUA will remain in effect (meaning this test can be used) for the duration of the COVID-19 declaration under Section 564(b)(1) of the Act, 21 U.S.C. section 360bbb-3(b)(1), unless the authorization is terminated or revoked.  Performed at [facility], [address] [place], [place] [address]     Labs: CBC:  Recent Labs  Lab 10/30/24 1231 10/31/24 0408  WBC 17.7* 9.2  HGB 12.2* 10.4*  HCT 35.8* 31.6*  MCV 91.8 92.1  PLT 211 168   Basic Metabolic Panel: Recent  Labs  Lab 10/30/24 1231 10/31/24 0408  NA 139 143  K 4.8 4.4  CL 103 109  CO2 22 24  GLUCOSE 158* 87  BUN 52* 45*  CREATININE 2.69* 2.40*  CALCIUM  9.9 9.1   Liver Function Tests: Recent Labs  Lab 10/30/24 1231 10/31/24 0408  AST 23 23  ALT 15 11  ALKPHOS 89 62  BILITOT 0.7 0.6  PROT 7.6 6.2*  ALBUMIN 4.6 3.9   CBG: Recent Labs  Lab 10/30/24 1238 10/30/24 1817 10/30/24 2221 10/31/24 0817 10/31/24 1634  GLUCAP 158* 151* 119* 91 232*    Discharge time spent: 35 minutes.  Signed: Elgin Lam, MD [facility] 10/31/2024

## 2024-10-31 NOTE — Evaluation (Signed)
 Occupational Therapy Evaluation Patient Details Name: [name] MRN: [id number] DOB: [birth year] Today's Date: 10/31/2024   History of Present Illness   68 y.o. male presents to [facility] hospital on 10/30/2024 with nausea, vomiting, diarrhea, weakness. Imaging notable for diverticulosis. Also RSV+. PMH includes HTN, HLD, DMII, CAD, CKD IV, diastolic CHF.     Clinical Impressions PTA, pt lives with spouse, typically Independent with ADLs, IADLs, driving and mobility without AD. Pt also raises cattle. Pt presents now with minor deficits in strength, endurance and dynamic standing balance due to increased time in bed. With progressive activity, mobility gradually improved. Overall, pt requires CGA for mobility with use of IV pole and no more than Supervision for ADLs. Anticipate with continued OOB activity that pt will return quickly to independence without postacute OT follow up. Will continue to follow while admitted.      If plan is discharge home, recommend the following:   Other (comment) (PRN)     Functional Status Assessment   Patient has had a recent decline in their functional status and demonstrates the ability to make significant improvements in function in a reasonable and predictable amount of time.     Equipment Recommendations   None recommended by OT     Recommendations for Other Services         Precautions/Restrictions   Precautions Precautions: Fall Restrictions Weight Bearing Restrictions Per Provider Order: No     Mobility Bed Mobility Overal bed mobility: Modified Independent                  Transfers Overall transfer level: Needs assistance Equipment used: None Transfers: Sit to/from Stand Sit to Stand: Contact guard assist           General transfer comment: CGA with hand on IV pole      Balance Overall balance assessment: Mild deficits observed, not formally tested                                          ADL either performed or assessed with clinical judgement   ADL Overall ADL's : Needs assistance/impaired Eating/Feeding: Independent   Grooming: Supervision/safety;Standing;Oral care;Wash/dry face;Wash/dry hands;Brushing hair   Upper Body Bathing: Modified independent   Lower Body Bathing: Supervison/ safety;Sitting/lateral leans;Sit to/from stand   Upper Body Dressing : Modified independent   Lower Body Dressing: Supervision/safety   Toilet Transfer: Contact guard assist;Ambulation;Regular Toilet   Toileting- Clothing Manipulation and Hygiene: Supervision/safety;Sitting/lateral lean;Sit to/from stand       Functional mobility during ADLs: Contact guard assist General ADL Comments: Some unsteadiness with mobility, use of IV pole for stability but improving with continued activity     Vision Baseline Vision/History: 1 Wears glasses Ability to See in Adequate Light: 0 Adequate Patient Visual Report: No change from baseline Vision Assessment?: No apparent visual deficits     Perception         Praxis         Pertinent Vitals/Pain Pain Assessment Pain Assessment: No/denies pain     Extremity/Trunk Assessment Upper Extremity Assessment Upper Extremity Assessment: Overall WFL for tasks assessed;Right hand dominant   Lower Extremity Assessment Lower Extremity Assessment: Defer to PT evaluation   Cervical / Trunk Assessment Cervical / Trunk Assessment: Normal   Communication Communication Communication: No apparent difficulties   Cognition Arousal: Alert Behavior During Therapy: WFL for tasks assessed/performed Cognition: No apparent impairments  Following commands: Intact       Cueing  General Comments   Cueing Techniques: Verbal cues      Exercises     Shoulder Instructions      Home Living Family/patient expects to be discharged to:: Private residence Living Arrangements: Spouse/significant  other Available Help at Discharge: Family Type of Home: House Home Access: Stairs to enter Secretary/administrator of Steps: 1+1   Home Layout: One level     Bathroom Shower/Tub: Producer, Television/film/video: Standard     Home Equipment: Grab bars - tub/shower;Cane - quad;Cane - single Librarian, Academic (2 wheels)   Additional Comments: Wife had hip surgery 2 months ago but pt only has to assist her with socks      Prior Functioning/Environment Prior Level of Function : Independent/Modified Independent;Driving             Mobility Comments: no AD ADLs Comments: Indep with ADLs, IADls, driving. 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 figure out what caused the illness OT Goal Formulation: With patient Time For Goal Achievement: 11/14/24 Potential to Achieve Goals: Good   OT Frequency:  Min 2X/week    Co-evaluation              AM-PAC OT 6 Clicks Daily Activity     Outcome Measure Help from another person eating meals?: None Help from another person taking care of personal grooming?: A Little Help from another person toileting, which includes using toliet, bedpan, or urinal?: A Little Help from another person bathing (including washing, rinsing, drying)?: A Little Help from another person to put on and taking off regular upper body clothing?: A Little Help from another person to put on and taking off regular lower body clothing?: A Little 6 Click Score: 19   End of Session Equipment Utilized During Treatment: Gait belt Nurse Communication:  Mobility status  Activity Tolerance: Patient tolerated treatment well Patient left: in bed;with call bell/phone within reach;with bed alarm set  OT Visit Diagnosis: Unsteadiness on feet (R26.81)                Time: 8742-8675 OT Time Calculation (min): 27 min Charges:  OT General Charges $OT Visit: 1 Visit OT Evaluation $OT Eval Low Complexity: 1 Low OT Treatments $Self Care/Home Management : 8-22 mins  Mliss NOVAK, OTR/L Acute Rehab Services Office: [phone]   Mliss Fish 10/31/2024, 1:31 PM

## 2024-10-31 NOTE — Progress Notes (Signed)
 Physical Therapy Quick Note  PT has completed initial evaluation.    Overall, patient at supervision assistance level.   PT Follow up recommended: Home Health PT Equipment recommended:  None recommended. Pt owns necessary DME, would benefit from use of a cane if discharging today. Complete evaluation note to follow.     Bernardino JINNY Ruth, PT, DPT Acute Rehabilitation Office [phone]

## 2024-10-31 NOTE — ED Notes (Signed)
 Pt came from another zone, pt states he's soaked in urine. Offered to change pt and clean pt up. Pt states wearing just a gown is for females. I explained to pt that we can give him some privacy with the curtains around the bed, get him a new gown and scrub pants and new sheet. Pt states he wants to eat so not right now.

## 2024-10-31 NOTE — Discharge Instructions (Addendum)
 [name],  You were in the hospital with evidence of a viral infection and some dehydration. You mentioned to me that  the reason you came was because you were very week and a bit lightheaded after getting up from a seated position. Thankfully you did not have any other symptoms. It is likely that you had some blood pressure issues from standing up after sitting on the toilet; this is not uncommon. Being dehydrated and ill from a viral infection likely did not help the situation. Please stay hydrated when you are at home. The physical therapists have recommended home health physical therapy for you.

## 2024-11-01 LAB — GLUCOSE, CAPILLARY: Glucose-Capillary: 144 mg/dL — ABNORMAL HIGH (ref 70–99)

## 2024-11-02 ENCOUNTER — Other Ambulatory Visit: Payer: Self-pay | Admitting: *Deleted

## 2024-11-02 NOTE — Patient Outreach (Signed)
 Complex Care Management   Visit Note  11/02/2024  Name:  [name] MRN: [id number] DOB: March 08, 1956  Situation: Referral received for Complex Care Management related to Heart Failure and Chronic Kidney Disease I obtained verbal consent from Patient.  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   Chronic kidney disease    Complication of anesthesia    Hard to wake   Degenerative joint disease (DJD) of lumbar spine    Diabetes mellitus without complication (HCC)    Type II   Essential hypertension 02/23/2017   Lumbar stenosis    NSTEMI (non-ST elevated myocardial infarction) (HCC) 06/07/2021   Sleep apnea     Assessment: Patient Reported Symptoms:  Cognitive Cognitive Status: No symptoms reported Cognitive/Intellectual Conditions Management [RPT]: None reported or documented in medical history or problem list   Health Maintenance Behaviors: Annual physical exam Healing Pattern: Average Health Facilitated by: Rest  Neurological Neurological Review of Symptoms: Dizziness, Weakness Neurological Self-Management Outcome: 3 (uncertain)  HEENT HEENT Symptoms Reported: Tearing HEENT Management Strategies: Routine screening HEENT Self-Management Outcome: 4 (good)    Cardiovascular Cardiovascular Symptoms Reported: Dizziness, Swelling in legs or feet Does patient have uncontrolled Hypertension?: No Weight: 214 lb (97.1 kg) Cardiovascular Self-Management Outcome: 3 (uncertain)  Respiratory Respiratory Symptoms Reported: Shortness of breath Respiratory Self-Management Outcome: 3 (uncertain)  Endocrine Endocrine Symptoms Reported: No symptoms reported Is patient diabetic?: Yes Is patient checking blood sugars at home?: No Endocrine Self-Management Outcome: 4 (good)  Gastrointestinal Gastrointestinal Symptoms Reported: Constipation Gastrointestinal  Management Strategies: Medication therapy Gastrointestinal Self-Management Outcome: 3 (uncertain) Nutrition Risk Screen (CP): No indicators present  Genitourinary Genitourinary Symptoms Reported: No symptoms reported Genitourinary Self-Management Outcome: 4 (good)  Integumentary Integumentary Symptoms Reported: No symptoms reported    Musculoskeletal Musculoskelatal Symptoms Reviewed: Weakness, Unsteady gait Musculoskeletal Management Strategies: Routine screening Musculoskeletal Self-Management Outcome: 3 (uncertain) Falls in the past year?: No Number of falls in past year: 1 or less Was there an injury with Fall?: No Fall Risk Category Calculator: 0 Patient Fall Risk Level: Low Fall Risk Patient at Risk for Falls Due to: No Fall Risks Fall risk Follow up: Falls evaluation completed  Psychosocial Psychosocial Symptoms Reported: No symptoms reported Behavioral Management Strategies: Coping strategies Behavioral Health Self-Management Outcome: 4 (good) Major Change/Loss/Stressor/Fears (CP): Denies Techniques to Cope with Loss/Stress/Change: Not applicable      11/02/2024    PHQ2-9 Depression Screening   Little interest or pleasure in doing things Not at all  Feeling down, depressed, or hopeless Not at all  PHQ-2 - Total Score 0  Trouble falling or staying asleep, or sleeping too much    Feeling tired or having little energy    Poor appetite or overeating     Feeling bad about yourself - or that you are a failure or have let yourself or your family down    Trouble concentrating on things, such as reading the newspaper or watching television    Moving or speaking so slowly that other people could have noticed.  Or the opposite - being so fidgety or restless that you have been moving around a lot more than usual    Thoughts that you would be better off dead, or hurting yourself in some way    PHQ2-9 Total Score    If you checked off any problems, how difficult have these problems  made it for you to  do your work, take care of things at home, or get along with other people    Depression Interventions/Treatment      Today's Vitals   11/02/24 1025  BP:   Pulse:   Weight: 214 lb (97.1 kg)   Pain Score: 0-No pain  Medications Reviewed Today     Reviewed by Bertrum Rosina HERO, RN (Registered Nurse) on 11/02/24 at 1019  Med List Status: <None>   Medication Order Taking? Sig Documenting Provider Last Dose Status Informant  amLODipine  (NORVASC ) 10 MG tablet [id number] Yes Take 1 tablet (10 mg total) by mouth daily. Per Delsie Riggs renal Jolinda Norene HERO, DO  Active Self, Pharmacy Records  aspirin  81 MG EC tablet [id number] Yes Take 1 tablet (81 mg total) by mouth daily. Swallow whole. Dock Bruno HERO., PA-C  Active Self, Pharmacy Records  atorvastatin  (LIPITOR ) 80 MG tablet [id number] Yes Take 1 tablet (80 mg total) by mouth daily. Jolinda Norene HERO, DO  Active Self, Pharmacy Records  carvedilol  (COREG ) 12.5 MG tablet [id number] Yes TAKE ONE TABLET TWICE DAILY WITH MEAL(S) Lynwood Schilling, MD  Active Self, Pharmacy Records  cloNIDine  (CATAPRES ) 0.2 MG tablet [id number] Yes Take 1 tablet (0.2 mg total) by mouth 2 (two) times daily. Lynwood Schilling, MD  Active Self, Pharmacy Records  furosemide  (LASIX ) 40 MG tablet [id number] Yes Take 1 tablet (40 mg total) by mouth daily. Jolinda Norene HERO, DO  Active Self, Pharmacy Records  gabapentin  (NEURONTIN ) 100 MG capsule [id number] Yes Take 2 capsules (200 mg total) by mouth 2 (two) times daily. Jolinda Norene HERO, DO  Active Self, Pharmacy Records  insulin  degludec (TRESIBA  FLEXTOUCH) 100 UNIT/ML FlexTouch Pen [id number] Yes INJECT 38 UP TO 50 UNITS DAILY AS DIRECTED.  Patient taking differently: Inject 36 Units into the skin in the morning.   Jolinda Norene HERO, DO  Active Self, Pharmacy Records  isosorbide  mononitrate (IMDUR ) 60 MG 24 hr tablet [id number] Yes TAKE 1 TABLET DAILY Lynwood Schilling, MD  Active Self, Pharmacy Records   losartan (COZAAR) 25 MG tablet [id number] Yes Take 25 mg by mouth daily. Per renal [provider]  Active Self, Pharmacy Records  nitroGLYCERIN  (NITROSTAT ) 0.4 MG SL tablet [id number] Yes Place 1 tablet (0.4 mg total) under the tongue every 5 (five) minutes as needed for chest pain. Jolinda Norene M, DO  Active   spironolactone  (ALDACTONE ) 25 MG tablet [id number] Yes Take 25 mg by mouth daily. Rachele Gaynell RAMAN, MD  Active Self, Pharmacy Records            Recommendation:   Continue Current Plan of Care  Follow Up Plan:   Telephone follow-up in 1 month  Rosina Bertrum, BSN RN [facility], [facility] RN Care Manager Direct Dial: [phone]  Fax: [phone]

## 2024-11-02 NOTE — Patient Instructions (Signed)
 Visit Information  Thank you for taking time to visit with me today. Please don't hesitate to contact me if I can be of assistance to you before our next scheduled appointment.  Your next care management appointment is by telephone on 12-03-2024 at 10:30 am  Telephone follow-up in 1 month  Please call the care guide team at [phone] if you need to cancel, schedule, or reschedule an appointment.   Please call the Suicide and Crisis Lifeline: 988 call the [place]  National Suicide Prevention Lifeline: (419) 844-8125 or TTY: (519) 693-5705 TTY 828 118 9832) to talk to a trained counselor call 1-800-273-TALK (toll free, 24 hour hotline) if you are experiencing a Mental Health or Behavioral Health Crisis or need someone to talk to.  Rosina Forte, BSN RN [facility], [facility] RN Care Manager Direct Dial: [phone]  Fax: [phone]

## 2024-11-05 ENCOUNTER — Telehealth: Payer: Self-pay

## 2024-11-05 NOTE — Transitions of Care (Post Inpatient/ED Visit) (Signed)
 "  11/05/2024  Name: [name] MRN: [id number] DOB: 1956/04/08  Today's TOC FU Call Status: Today's TOC FU Call Status:: Successful TOC FU Call Completed TOC FU Call Complete Date: 11/05/24  Patient's Name and Date of Birth confirmed. Name, DOB  Transition Care Management Follow-up Telephone Call Date of Discharge: 11/02/24 Discharge Facility: [facility]) Type of Discharge: Inpatient Admission Primary Inpatient Discharge Diagnosis:: gastroenteritis How have you been since you were released from the hospital?: Better Any questions or concerns?: No  Items Reviewed: Did you receive and understand the discharge instructions provided?: Yes Medications obtained,verified, and reconciled?: Yes (Medications Reviewed) Any new allergies since your discharge?: No Dietary orders reviewed?: Yes Do you have support at home?: Yes People in Home [RPT]: spouse, child(ren), adult  Medications Reviewed Today: Medications Reviewed Today     Reviewed by Emmitt Pan, LPN (Licensed Practical Nurse) on 11/05/24 at 1640  Med List Status: <None>   Medication Order Taking? Sig Documenting Provider Last Dose Status Informant  amLODipine  (NORVASC ) 10 MG tablet [id number]  Take 1 tablet (10 mg total) by mouth daily. Per Delsie Riggs renal  Patient not taking: Reported on 11/05/2024   Jolinda Norene HERO, DO  Active Self, Pharmacy Records  aspirin  81 MG EC tablet [id number] Yes Take 1 tablet (81 mg total) by mouth daily. Swallow whole. Dock Bruno HERO., PA-C  Active Self, Pharmacy Records  atorvastatin  (LIPITOR ) 80 MG tablet [id number] Yes Take 1 tablet (80 mg total) by mouth daily. Jolinda Norene HERO, DO  Active Self, Pharmacy Records  carvedilol  (COREG ) 12.5 MG tablet [id number] Yes TAKE ONE TABLET TWICE DAILY WITH MEAL(S) Lynwood Schilling, MD  Active Self, Pharmacy Records  cloNIDine  (CATAPRES ) 0.2 MG tablet [id number] Yes Take 1 tablet (0.2 mg total) by mouth 2 (two) times daily. Lynwood Schilling, MD  Active Self, Pharmacy Records  furosemide  (LASIX ) 40 MG tablet [id number] Yes Take 1 tablet (40 mg total) by mouth daily. Jolinda Norene HERO, DO  Active Self, Pharmacy Records  gabapentin  (NEURONTIN ) 100 MG capsule [id number] Yes Take 2 capsules (200 mg total) by mouth 2 (two) times daily. Jolinda Norene HERO, DO  Active Self, Pharmacy Records  insulin  degludec (TRESIBA  FLEXTOUCH) 100 UNIT/ML FlexTouch Pen [id number] Yes INJECT 38 UP TO 50 UNITS DAILY AS DIRECTED.  Patient taking differently: Inject 36 Units into the skin in the morning.   Jolinda Norene HERO, DO  Active Self, Pharmacy Records  isosorbide  mononitrate (IMDUR ) 60 MG 24 hr tablet [id number] Yes TAKE 1 TABLET DAILY Lynwood Schilling, MD  Active Self, Pharmacy Records  losartan (COZAAR) 25 MG tablet [id number] Yes Take 25 mg by mouth daily. Per renal [provider]  Active Self, Pharmacy Records  nitroGLYCERIN  (NITROSTAT ) 0.4 MG SL tablet [id number] Yes Place 1 tablet (0.4 mg total) under the tongue every 5 (five) minutes as needed for chest pain. Jolinda Norene M, DO  Active   spironolactone  (ALDACTONE ) 25 MG tablet [id number] Yes Take 25 mg by mouth daily. Rachele Gaynell RAMAN, MD  Active Self, Pharmacy Records            Home Care and Equipment/Supplies: Were Home Health Services Ordered?: NA Any new equipment or medical supplies ordered?: NA  Functional Questionnaire: Do you need assistance with bathing/showering or dressing?: No Do you need assistance with meal preparation?: No Do you need assistance with eating?: No Do you have difficulty maintaining continence: No Do you need assistance with getting out of bed/getting out of a chair/moving?: No Do you have  difficulty managing or taking your medications?: No  Follow up appointments reviewed: PCP Follow-up appointment confirmed?: Yes Date of PCP follow-up appointment?: 11/12/24 Follow-up Provider: [facility] Follow-up appointment  confirmed?: NA Do you need transportation to your follow-up appointment?: No Do you understand care options if your condition(s) worsen?: Yes-patient verbalized understanding    SIGNATURE Julian Lemmings, LPN [facility] Nurse Health Advisor Direct Dial [phone]  "

## 2024-11-12 ENCOUNTER — Inpatient Hospital Stay: Admitting: Family

## 2024-11-20 ENCOUNTER — Inpatient Hospital Stay: Admitting: Nurse Practitioner

## 2024-11-26 NOTE — Progress Notes (Unsigned)
 " Cardiology Office Note:   Date:  11/28/2024  ID:  [name], DOB 1956/02/01, MRN [id number] PCP: Jolinda Norene HERO, DO  [facility] Providers Cardiologist:  Lynwood Schilling, MD {  History of Present Illness:   [name] is a 69 y.o. male  who had previously seen with hypertension, hyperlipidemia not at diabetes mellitus, CAD with branch vessel and distal vessel CAD, CKD 4.     His last echo was in 2025.  His EF was 65%.there was a small pericardial effusion.  No other significant findings.   Well-preserved ejection fraction.  Cardiac cath at that time demonstrated branch vessel distal vessel disease.  He had had previous stent in his circumflex and obtuse marginal that were patent.   He presents today for follow-up.   Since I last saw him he had some leg weakness and lightheadedness.  He went to the emergency room and was admitted very briefly.  I reviewed these records for this visit.  He had lightheadedness.  Head CT demonstrated no acute abnormality.  He had low blood pressures and with his renal insufficiency and low blood pressure his amlodipine  was discontinued at discharge.  He was found to have RSV infection at that time.  He slowly recovered at home.  He still says that he is getting short of breath with mild activity.  He is not having any cough fevers or chills.  He denies any PND or orthopnea.  He has had no new edema.   ROS: Positive for neuropathy.  Positive for balance issues. Otherwise as stated in the HPI and negative for all other systems.  Studies Reviewed:    EKG:     NA  Risk Assessment/Calculations:              Physical Exam:   VS:  BP 108/61 (BP Location: Left Arm, Patient Position: Sitting, Cuff Size: Large)   Pulse (!) 59   Resp 16   Ht 6' (1.829 m)   Wt 222 lb 9.6 oz (101 kg)   SpO2 99%   BMI 30.19 kg/m    Wt Readings from Last 3 Encounters:  11/28/24 222 lb 9.6 oz (101 kg)  11/02/24 214 lb (97.1 kg)  10/30/24 214 lb  15.2 oz (97.5 kg)     GEN: Well nourished, well developed in no acute distress NECK: No JVD; No carotid bruits CARDIAC: RRR, soft left upper sternal border systolic murmur nonradiating, no diastolic murmurs, rubs, gallops RESPIRATORY:  Clear to auscultation without rales, wheezing or rhonchi  ABDOMEN: Soft, non-tender, non-distended EXTREMITIES:  No edema; No deformity   ASSESSMENT AND PLAN:   SOB: Patient had a negative perfusion study.  CT of his chest did not demonstrate any acute abnormalities when he was in the hospital recently.  I am going to order BNP and pulmonary function testing.   CAD: He had negative perfusion imaging as above.  He will continue with risk reduction.   CKD IV:   He is followed by nephrology.  He wants to avoid dialysis at all costs.   SLEEP APNEA: He was working with neurology on this.  I don't see that he ever got sleep testing and he needs to contact her office to inquire.    HTN: His blood pressure is running low and so he can remain off the amlodipine .   DM: A1c is 6.0.  No change in therapy.   Pericardial effusion: This was small on echo.  If he continues to have  any low blood pressures and shortness of breath without other clear etiology I will repeat an echocardiogram.     Follow up with me in 3 months.   Signed, Lynwood Schilling, MD   "

## 2024-11-28 ENCOUNTER — Ambulatory Visit: Admitting: Cardiology

## 2024-11-28 ENCOUNTER — Encounter: Payer: Self-pay | Admitting: Cardiology

## 2024-11-28 VITALS — BP 108/61 | HR 59 | Resp 16 | Ht 72.0 in | Wt 222.6 lb

## 2024-11-28 DIAGNOSIS — R0602 Shortness of breath: Secondary | ICD-10-CM | POA: Diagnosis not present

## 2024-11-28 DIAGNOSIS — E118 Type 2 diabetes mellitus with unspecified complications: Secondary | ICD-10-CM | POA: Diagnosis not present

## 2024-11-28 DIAGNOSIS — I2510 Atherosclerotic heart disease of native coronary artery without angina pectoris: Secondary | ICD-10-CM

## 2024-11-28 DIAGNOSIS — I10 Essential (primary) hypertension: Secondary | ICD-10-CM | POA: Diagnosis not present

## 2024-11-28 DIAGNOSIS — N184 Chronic kidney disease, stage 4 (severe): Secondary | ICD-10-CM | POA: Diagnosis not present

## 2024-11-28 NOTE — Patient Instructions (Addendum)
 Medication Instructions:  Your physician recommends that you continue on your current medications as directed. Please refer to the Current Medication list given to you today.  Labwork: BNP  Testing/Procedures: Your physician has recommended that you have a pulmonary function test. Pulmonary Function Tests are a group of tests that measure how well air moves in and out of your lungs.  Follow-Up: Your physician recommends that you schedule a follow-up appointment in: 3 months  Any Other Special Instructions Will Be Listed Below (If Applicable).  If you need a refill on your cardiac medications before your next appointment, please call your pharmacy.

## 2024-12-03 ENCOUNTER — Other Ambulatory Visit: Payer: Self-pay | Admitting: *Deleted

## 2024-12-03 NOTE — Patient Instructions (Signed)
 Visit Information  Thank you for taking time to visit with me today. Please don't hesitate to contact me if I can be of assistance to you before our next scheduled appointment.  Your next care management appointment is by telephone on 01-03-2025 at 10:30 am  Telephone follow-up in 1 month  Please call the care guide team at [phone] if you need to cancel, schedule, or reschedule an appointment.   Please call the Suicide and Crisis Lifeline: 988 call the [place]  National Suicide Prevention Lifeline: 407-843-6697 or TTY: 864-134-4171 TTY 628 138 3020) to talk to a trained counselor call 1-800-273-TALK (toll free, 24 hour hotline) if you are experiencing a Mental Health or Behavioral Health Crisis or need someone to talk to.  Rosina Forte, BSN RN [facility], [facility] RN Care Manager Direct Dial: [phone]  Fax: [phone]

## 2024-12-03 NOTE — Patient Outreach (Signed)
 Complex Care Management   Visit Note  12/03/2024  Name:  [name] MRN: [id number] DOB: 1956/01/12  Situation: Referral received for Complex Care Management related to Heart Failure and Chronic Kidney Disease I obtained verbal consent from Patient.  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   Chronic kidney disease    Complication of anesthesia    Hard to wake   Degenerative joint disease (DJD) of lumbar spine    Diabetes mellitus without complication (HCC)    Type II   Essential hypertension 02/23/2017   Lumbar stenosis    NSTEMI (non-ST elevated myocardial infarction) (HCC) 06/07/2021   Sleep apnea     Assessment: Patient Reported Symptoms:  Cognitive Cognitive Status: No symptoms reported Cognitive/Intellectual Conditions Management [RPT]: None reported or documented in medical history or problem list   Health Maintenance Behaviors: Annual physical exam Healing Pattern: Average Health Facilitated by: Rest  Neurological Neurological Review of Symptoms: No symptoms reported Neurological Management Strategies: Routine screening Neurological Self-Management Outcome: 4 (good)  HEENT HEENT Symptoms Reported: Tearing, Nasal discharge HEENT Management Strategies: Routine screening HEENT Self-Management Outcome: 4 (good)    Cardiovascular Cardiovascular Symptoms Reported: No symptoms reported Does patient have uncontrolled Hypertension?: No Cardiovascular Management Strategies: Routine screening Weight: 214 lb (97.1 kg) Cardiovascular Self-Management Outcome: 4 (good)  Respiratory Respiratory Symptoms Reported: Shortness of breath Respiratory Management Strategies: Coping strategies Respiratory Self-Management Outcome: 4 (good)  Endocrine Endocrine Symptoms Reported: No symptoms reported Is patient diabetic?: Yes Is patient checking blood  sugars at home?: No Endocrine Self-Management Outcome: 4 (good)  Gastrointestinal Gastrointestinal Symptoms Reported: No symptoms reported Gastrointestinal Self-Management Outcome: 4 (good)    Genitourinary Genitourinary Symptoms Reported: No symptoms reported Genitourinary Self-Management Outcome: 4 (good)  Integumentary Integumentary Symptoms Reported: No symptoms reported    Musculoskeletal Musculoskelatal Symptoms Reviewed: Back pain Musculoskeletal Management Strategies: Routine screening Musculoskeletal Self-Management Outcome: 4 (good) Falls in the past year?: No Number of falls in past year: 1 or less Was there an injury with Fall?: No Fall Risk Category Calculator: 0 Patient Fall Risk Level: Low Fall Risk Patient at Risk for Falls Due to: No Fall Risks Fall risk Follow up: Falls evaluation completed  Psychosocial Psychosocial Symptoms Reported: Sadness - if selected complete PHQ 2-9 Behavioral Management Strategies: Coping strategies Behavioral Health Self-Management Outcome: 4 (good) Major Change/Loss/Stressor/Fears (CP): Death of a loved one Techniques to Cope with Loss/Stress/Change: Diversional activities Quality of Family Relationships: supportive, helpful Do you feel physically threatened by others?: No    12/03/2024    PHQ2-9 Depression Screening   Little interest or pleasure in doing things Not at all  Feeling down, depressed, or hopeless Several days  PHQ-2 - Total Score 1  Trouble falling or staying asleep, or sleeping too much    Feeling tired or having little energy    Poor appetite or overeating     Feeling bad about yourself - or that you are a failure or have let yourself or your family down    Trouble concentrating on things, such as reading the newspaper or watching television    Moving or speaking so slowly that other people could have noticed.  Or the opposite - being so fidgety or restless that you have been moving around a lot more than usual     Thoughts that you would be better off dead, or hurting yourself in some way  PHQ2-9 Total Score    If you checked off any problems, how difficult have these problems made it for you to do your work, take care of things at home, or get along with other people    Depression Interventions/Treatment      Today's Vitals   12/03/24 1040  BP: 139/64  Weight: 214 lb (97.1 kg)   Pain Scale: 0-10 Pain Score: 0-No pain  Medications Reviewed Today     Reviewed by Bertrum Rosina HERO, RN (Registered Nurse) on 12/03/24 at 1035  Med List Status: <None>   Medication Order Taking? Sig Documenting Provider Last Dose Status Informant  amLODipine  (NORVASC ) 10 MG tablet [id number]  Take 1 tablet (10 mg total) by mouth daily. Per Delsie Riggs renal  Patient not taking: Reported on 12/03/2024   Jolinda Norene HERO, DO  Active Self, Pharmacy Records  aspirin  81 MG EC tablet [id number] Yes Take 1 tablet (81 mg total) by mouth daily. Swallow whole. Dock Bruno HERO., PA-C  Active Self, Pharmacy Records  atorvastatin  (LIPITOR ) 80 MG tablet [id number] Yes Take 1 tablet (80 mg total) by mouth daily. Jolinda Norene HERO, DO  Active Self, Pharmacy Records  carvedilol  (COREG ) 12.5 MG tablet [id number] Yes TAKE ONE TABLET TWICE DAILY WITH MEAL(S) Lavona Agent, MD  Active Self, Pharmacy Records  cloNIDine  (CATAPRES ) 0.2 MG tablet [id number] Yes Take 1 tablet (0.2 mg total) by mouth 2 (two) times daily. Lavona Agent, MD  Active Self, Pharmacy Records  furosemide  (LASIX ) 40 MG tablet [id number] Yes Take 1 tablet (40 mg total) by mouth daily. Jolinda Norene HERO, DO  Active Self, Pharmacy Records  gabapentin  (NEURONTIN ) 100 MG capsule [id number] Yes Take 2 capsules (200 mg total) by mouth 2 (two) times daily. Jolinda Norene HERO, DO  Active Self, Pharmacy Records  insulin  degludec (TRESIBA  FLEXTOUCH) 100 UNIT/ML FlexTouch Pen [id number] Yes INJECT 38 UP TO 50 UNITS DAILY AS DIRECTED.  Patient taking differently: Inject 36  Units into the skin in the morning.   Jolinda Norene HERO, DO  Active Self, Pharmacy Records  isosorbide  mononitrate (IMDUR ) 60 MG 24 hr tablet [id number] Yes TAKE 1 TABLET DAILY Lavona Agent, MD  Active Self, Pharmacy Records  losartan (COZAAR) 25 MG tablet [id number] Yes Take 25 mg by mouth daily. Per renal [provider]  Active Self, Pharmacy Records  nitroGLYCERIN  (NITROSTAT ) 0.4 MG SL tablet [id number] Yes Place 1 tablet (0.4 mg total) under the tongue every 5 (five) minutes as needed for chest pain. Jolinda Norene M, DO  Active   spironolactone  (ALDACTONE ) 25 MG tablet [id number] Yes Take 25 mg by mouth daily. Rachele Gaynell RAMAN, MD  Active Self, Pharmacy Records            Recommendation:   Continue Current Plan of Care  Follow Up Plan:   Telephone follow-up in 1 month  Rosina Bertrum, BSN RN [facility], [facility] RN Care Manager Direct Dial: [phone]  Fax: [phone]

## 2024-12-12 ENCOUNTER — Other Ambulatory Visit

## 2024-12-14 ENCOUNTER — Ambulatory Visit: Payer: Self-pay | Admitting: Cardiology

## 2024-12-14 LAB — BRAIN NATRIURETIC PEPTIDE: BNP: 97 pg/mL (ref 0.0–100.0)

## 2024-12-20 ENCOUNTER — Other Ambulatory Visit

## 2024-12-21 ENCOUNTER — Other Ambulatory Visit: Payer: Self-pay | Admitting: Cardiology

## 2024-12-21 NOTE — Telephone Encounter (Signed)
 Results sent via Letter 2/6

## 2025-01-03 ENCOUNTER — Telehealth: Admitting: *Deleted

## 2025-03-06 ENCOUNTER — Ambulatory Visit: Admitting: Cardiology

## 2025-03-19 ENCOUNTER — Encounter: Payer: Self-pay | Admitting: Family Medicine
